# Patient Record
Sex: Female | Born: 1950
Health system: Southern US, Community
[De-identification: ages and names within clinical notes are randomized; demographics above are authoritative.]

## PROBLEM LIST (undated history)

## (undated) DIAGNOSIS — F329 Major depressive disorder, single episode, unspecified: Secondary | ICD-10-CM

## (undated) DIAGNOSIS — G2581 Restless legs syndrome: Secondary | ICD-10-CM

## (undated) DIAGNOSIS — I2510 Atherosclerotic heart disease of native coronary artery without angina pectoris: Secondary | ICD-10-CM

## (undated) DIAGNOSIS — G4730 Sleep apnea, unspecified: Secondary | ICD-10-CM

## (undated) DIAGNOSIS — I499 Cardiac arrhythmia, unspecified: Secondary | ICD-10-CM

## (undated) DIAGNOSIS — U071 COVID-19: Secondary | ICD-10-CM

## (undated) DIAGNOSIS — G8929 Other chronic pain: Secondary | ICD-10-CM

## (undated) DIAGNOSIS — K5792 Diverticulitis of intestine, part unspecified, without perforation or abscess without bleeding: Secondary | ICD-10-CM

## (undated) DIAGNOSIS — R600 Localized edema: Secondary | ICD-10-CM

## (undated) DIAGNOSIS — I700 Atherosclerosis of aorta: Secondary | ICD-10-CM

## (undated) DIAGNOSIS — R0600 Dyspnea, unspecified: Secondary | ICD-10-CM

## (undated) DIAGNOSIS — R001 Bradycardia, unspecified: Secondary | ICD-10-CM

## (undated) DIAGNOSIS — F419 Anxiety disorder, unspecified: Secondary | ICD-10-CM

## (undated) DIAGNOSIS — I10 Essential (primary) hypertension: Secondary | ICD-10-CM

## (undated) DIAGNOSIS — M1990 Unspecified osteoarthritis, unspecified site: Secondary | ICD-10-CM

## (undated) DIAGNOSIS — I480 Paroxysmal atrial fibrillation: Secondary | ICD-10-CM

## (undated) DIAGNOSIS — N189 Chronic kidney disease, unspecified: Secondary | ICD-10-CM

## (undated) DIAGNOSIS — R87619 Unspecified abnormal cytological findings in specimens from cervix uteri: Secondary | ICD-10-CM

## (undated) DIAGNOSIS — N950 Postmenopausal bleeding: Secondary | ICD-10-CM

## (undated) DIAGNOSIS — M545 Low back pain, unspecified: Secondary | ICD-10-CM

## (undated) DIAGNOSIS — R002 Palpitations: Secondary | ICD-10-CM

## (undated) DIAGNOSIS — F32A Depression, unspecified: Secondary | ICD-10-CM

## (undated) DIAGNOSIS — Z905 Acquired absence of kidney: Secondary | ICD-10-CM

## (undated) DIAGNOSIS — E559 Vitamin D deficiency, unspecified: Secondary | ICD-10-CM

## (undated) HISTORY — PX: CHOLECYSTECTOMY: SHX55

## (undated) HISTORY — PX: OTHER SURGICAL HISTORY: SHX169

## (undated) HISTORY — DX: Postmenopausal bleeding: N95.0

## (undated) HISTORY — PX: JOINT REPLACEMENT: SHX530

## (undated) HISTORY — PX: WRIST ARTHROSCOPY: SUR100

## (undated) HISTORY — PX: CARDIAC CATHETERIZATION: SHX172

## (undated) HISTORY — DX: Essential (primary) hypertension: I10

## (undated) HISTORY — PX: DILATION AND CURETTAGE OF UTERUS: SHX78

## (undated) HISTORY — DX: Acquired absence of kidney: Z90.5

## (undated) HISTORY — DX: Unspecified abnormal cytological findings in specimens from cervix uteri: R87.619

## (undated) HISTORY — PX: HERNIA REPAIR: SHX51

---
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
## 1977-08-04 DIAGNOSIS — Z905 Acquired absence of kidney: Secondary | ICD-10-CM

## 1977-08-04 HISTORY — PX: KIDNEY SURGERY: SHX687

## 1977-08-04 HISTORY — DX: Acquired absence of kidney: Z90.5

## 2016-04-16 ENCOUNTER — Other Ambulatory Visit: Payer: Self-pay | Admitting: Nurse Practitioner

## 2016-04-16 DIAGNOSIS — R262 Difficulty in walking, not elsewhere classified: Secondary | ICD-10-CM

## 2016-04-18 ENCOUNTER — Encounter: Payer: Self-pay | Admitting: Radiology

## 2016-04-18 ENCOUNTER — Ambulatory Visit
Admission: RE | Admit: 2016-04-18 | Discharge: 2016-04-18 | Disposition: A | Discharge status: Home / Self Care | Payer: Medicare Other | Source: Ambulatory Visit | Attending: Nurse Practitioner | Admitting: Nurse Practitioner

## 2016-04-18 DIAGNOSIS — M21372 Foot drop, left foot: Secondary | ICD-10-CM | POA: Insufficient documentation

## 2016-04-18 DIAGNOSIS — R9082 White matter disease, unspecified: Secondary | ICD-10-CM | POA: Diagnosis not present

## 2016-04-18 DIAGNOSIS — R262 Difficulty in walking, not elsewhere classified: Secondary | ICD-10-CM | POA: Diagnosis not present

## 2016-05-28 ENCOUNTER — Other Ambulatory Visit: Payer: Self-pay | Admitting: Neurology

## 2016-05-28 DIAGNOSIS — R29898 Other symptoms and signs involving the musculoskeletal system: Secondary | ICD-10-CM

## 2016-06-09 ENCOUNTER — Encounter: Payer: Self-pay | Admitting: Nurse Practitioner

## 2016-06-09 ENCOUNTER — Ambulatory Visit
Admission: RE | Admit: 2016-06-09 | Discharge: 2016-06-09 | Disposition: A | Discharge status: Home / Self Care | Payer: Medicare Other | Source: Ambulatory Visit | Attending: Neurology | Admitting: Neurology

## 2016-06-09 DIAGNOSIS — M48061 Spinal stenosis, lumbar region without neurogenic claudication: Secondary | ICD-10-CM | POA: Diagnosis not present

## 2016-06-09 DIAGNOSIS — M5126 Other intervertebral disc displacement, lumbar region: Secondary | ICD-10-CM | POA: Insufficient documentation

## 2016-06-09 DIAGNOSIS — R29898 Other symptoms and signs involving the musculoskeletal system: Secondary | ICD-10-CM | POA: Insufficient documentation

## 2016-06-23 ENCOUNTER — Ambulatory Visit: Payer: Medicare Other | Attending: Neurology | Admitting: Physical Therapy

## 2016-06-23 DIAGNOSIS — M6281 Muscle weakness (generalized): Secondary | ICD-10-CM | POA: Diagnosis not present

## 2016-06-23 DIAGNOSIS — M25551 Pain in right hip: Secondary | ICD-10-CM | POA: Diagnosis present

## 2016-06-23 DIAGNOSIS — M5441 Lumbago with sciatica, right side: Secondary | ICD-10-CM | POA: Diagnosis present

## 2016-06-23 DIAGNOSIS — R262 Difficulty in walking, not elsewhere classified: Secondary | ICD-10-CM | POA: Insufficient documentation

## 2016-06-23 DIAGNOSIS — G8929 Other chronic pain: Secondary | ICD-10-CM | POA: Insufficient documentation

## 2016-06-23 NOTE — Therapy (Signed)
[place] [facility] [address] [place], [address] Phone: [phone]   Fax:  [phone]  Physical Therapy Evaluation  Patient Details  Name: Whitney [name] MRN: [id number] Date of Birth: 1951-02-14 Referring Provider: Gurney Maxin  Encounter Date: 06/23/2016      PT End of Session - 06/23/16 1142    Visit Number 1   Number of Visits 17   Date for PT Re-Evaluation 08/18/16   Authorization Type g code   PT Start Time 0945   PT Stop Time 1035   PT Time Calculation (min) 50 min   Activity Tolerance Patient limited by pain   Behavior During Therapy [facility] for tasks assessed/performed      No past medical history on file.  No past surgical history on file.  There were no vitals filed for this visit.       Subjective Assessment - 06/23/16 0956    Subjective Pt reports she woke up in August with weakness in her L LE. She has had multiple years of pain in B LE and has required [facility] for gait for an extended period of time.  Denies back pain. Denies B/B. Does have N/T B, worse on R, appears to be in a stocking/glove distribution.    Pertinent History leg weakness   How long can you sit comfortably? forever   How long can you stand comfortably? 5 minutes   How long can you walk comfortably? "not at all"   Diagnostic tests imaging - see scanned docs   Patient Stated Goals "I want to be able to walk a mile"   Currently in Pain? No/denies            [facility] PT Assessment - 06/23/16 0001      Assessment   Medical Diagnosis B leg weakness   Referring Provider Gurney Maxin   Onset Date/Surgical Date 03/04/16   Next MD Visit 07/06/2016   Prior Therapy None for this     Precautions   Precautions Fall     Restrictions   Weight Bearing Restrictions No     Balance Screen   Has the patient fallen in the past 6 months Yes   How many times? 2   Has the patient had a decrease in activity level because of a  fear of falling?  Yes   Is the patient reluctant to leave their home because of a fear of falling?  Yes     [place] residence   Living Arrangements Other relatives     Prior Function   Level of Independence Independent with basic ADLs   Vocation Retired   Catering manager, "running around doing something"     Sensation   Additional Comments Pt reports impaired sensation on the R in a sciatic nerve distribution. Unable to test formally due to attire and time allowed.     Functional Tests   Functional tests Squat     Squat   Comments unable to perform due to pain     Posture/Postural Control   Posture Comments FHP, in standing pt stands with wt shifted onto L.      ROM / Strength   AROM / PROM / Strength PROM;Strength     PROM   Overall PROM Comments hip PROM is significantly limited B with ER, worse on R with some reproduction of pain with assessment. Knee flexion limited and painful B.      Strength  Overall Strength Deficits   Right/Left Hip Right;Left   Right Hip Flexion 3/5   Right Hip Extension 3/5   Right Hip External Rotation  3/5   Right Hip Internal Rotation 3/5   Right Hip ABduction 3/5   Right Hip ADduction 3/5   Left Hip Flexion 4/5   Left Hip Extension 4/5   Left Hip External Rotation 4/5   Left Hip Internal Rotation 4/5   Left Hip ABduction 4/5   Left Hip ADduction 4/5   Right/Left Knee Right;Left   Right Knee Flexion 4-/5   Right Knee Extension 4+/5   Left Knee Flexion 4/5   Left Knee Extension 4/5   Right/Left Ankle Right;Left   Right Ankle Dorsiflexion 4/5   Right Ankle Plantar Flexion 4/5   Right Ankle Inversion 4/5   Right Ankle Eversion 4/5   Left Ankle Dorsiflexion 4/5   Left Ankle Plantar Flexion 4/5   Left Ankle Inversion 4/5   Left Ankle Eversion 4/5     Palpation   Spinal mobility Spinal motion WNL except L rotation painful on R side.    Palpation comment pain with palpation over R glutes, R  paraspinals, noted hypertonicity in this region.     Ambulation/Gait   Assistive device Small based quad cane   Gait Pattern Step-to pattern;Trendelenburg;Lateral hip instability;Lateral trunk lean to left   Gait Comments 10 mws .2 m/s, inconsistent use of quad cane on R side.                           PT Education - 06/23/16 1141    Education provided Yes   Education Details course of PT, aquatic therapy   Person(s) Educated Patient   Methods Explanation   Comprehension Verbalized understanding             PT Long Term Goals - 06/23/16 1150      PT LONG TERM GOAL #1   Title Pt will improve 10 MWS to .8 M/S to improve functional activity tolerance/gait   Baseline .2 M/S   Time 8   Period Weeks   Status New     PT LONG TERM GOAL #2   Title Pt will be I with HEP to improve B hip strength to 5/5 to improve tolerance for gait   Baseline 3/5 R, 4/5 L   Time 8   Period Weeks   Status New     PT LONG TERM GOAL #3   Title pt will be able to sit<>stand with single UE support for decr. balance   Baseline definite use of UE for support for this   Time 8   Period Weeks   Status New               Plan - 06/23/16 1143    Clinical Impression Statement Pt is a pleasant 65 y/o female with c/o complex history of B LE pain, muscle weakness. Pain appears to be related to lumbar spine based on sciatic distribution of N/T. Muscle weakness appears to be related to pain in B knees. No foot drop, pt amb with quad cane with R trendelenberg. She has consistent pain with exercise. Will have pt attend aquatic PT for 2 wks for strengthening in non-painful environment, hope to progress to addressing LE pain and improving strength and functional tolerance.   Rehab Potential Good   Clinical Impairments Affecting Rehab Potential Pro: motivation. .Con: chronicity of pain   PT Frequency 2x /  week   PT Duration 8 weeks   PT Treatment/Interventions ADLs/Self Care Home  Management;Aquatic Therapy;Therapeutic activities;Therapeutic exercise;Manual techniques;Gait training;Neuromuscular re-education;Balance training   PT Next Visit Plan aquatic therapy for pain free strengthening   Consulted and Agree with Plan of Care Patient      Patient will benefit from skilled therapeutic intervention in order to improve the following deficits and impairments:  Pain, Improper body mechanics, Postural dysfunction, Difficulty walking, Decreased balance, Decreased strength, Decreased range of motion, Decreased endurance, Hypomobility  Visit Diagnosis: Muscle weakness (generalized) - Plan: PT plan of care cert/re-cert  Difficulty in walking, not elsewhere classified - Plan: PT plan of care cert/re-cert  Chronic right-sided low back pain with right-sided sciatica - Plan: PT plan of care cert/re-cert  Pain in right hip - Plan: PT plan of care cert/re-cert      G-Codes - [id number]    Functional Assessment Tool Used 10 MWS, MMT, clinical judgment   Functional Limitation Mobility: Walking and moving around   Mobility: Walking and Moving Around Current Status [id number]) At least 60 percent but less than 80 percent impaired, limited or restricted   Mobility: Walking and Moving Around Goal Status [phone]) At least 20 percent but less than 40 percent impaired, limited or restricted       Problem List There are no active problems to display for this patient.   Zayveon Raschke PT DPT 06/23/2016, 12:01 PM  [place] [facility] [address] [place], [address] Phone: [phone]   Fax:  [phone]  Name: Starlena Pata MRN: [id number] Date of Birth: [birth year]

## 2016-06-24 DIAGNOSIS — M79604 Pain in right leg: Secondary | ICD-10-CM | POA: Insufficient documentation

## 2016-06-24 DIAGNOSIS — M79605 Pain in left leg: Secondary | ICD-10-CM | POA: Insufficient documentation

## 2016-07-01 ENCOUNTER — Encounter: Payer: Medicare Other | Admitting: Physical Therapy

## 2016-07-01 ENCOUNTER — Other Ambulatory Visit: Payer: Self-pay | Admitting: Internal Medicine

## 2016-07-01 ENCOUNTER — Ambulatory Visit: Payer: Medicare Other | Admitting: Physical Therapy

## 2016-07-01 DIAGNOSIS — Z1231 Encounter for screening mammogram for malignant neoplasm of breast: Secondary | ICD-10-CM

## 2016-07-03 ENCOUNTER — Encounter: Payer: Medicare Other | Admitting: Physical Therapy

## 2016-07-04 DIAGNOSIS — M25569 Pain in unspecified knee: Secondary | ICD-10-CM | POA: Insufficient documentation

## 2016-07-07 ENCOUNTER — Encounter: Payer: Medicare Other | Admitting: Physical Therapy

## 2016-07-08 ENCOUNTER — Ambulatory Visit: Payer: Medicare Other | Admitting: Physical Therapy

## 2016-07-10 ENCOUNTER — Ambulatory Visit: Payer: Medicare Other | Admitting: Physical Therapy

## 2016-07-15 ENCOUNTER — Other Ambulatory Visit: Payer: Self-pay | Admitting: Obstetrics and Gynecology

## 2016-07-15 ENCOUNTER — Ambulatory Visit: Payer: Medicare Other | Admitting: Physical Therapy

## 2016-07-15 ENCOUNTER — Encounter: Payer: Self-pay | Admitting: Obstetrics and Gynecology

## 2016-07-15 ENCOUNTER — Ambulatory Visit (INDEPENDENT_AMBULATORY_CARE_PROVIDER_SITE_OTHER): Payer: Medicare Other | Admitting: Obstetrics and Gynecology

## 2016-07-15 VITALS — BP 177/69 | HR 73 | Ht 60.0 in | Wt 261.0 lb

## 2016-07-15 DIAGNOSIS — Z6841 Body Mass Index (BMI) 40.0 and over, adult: Secondary | ICD-10-CM | POA: Diagnosis not present

## 2016-07-15 DIAGNOSIS — Z803 Family history of malignant neoplasm of breast: Secondary | ICD-10-CM | POA: Diagnosis not present

## 2016-07-15 DIAGNOSIS — Z780 Asymptomatic menopausal state: Secondary | ICD-10-CM | POA: Insufficient documentation

## 2016-07-15 DIAGNOSIS — IMO0001 Reserved for inherently not codable concepts without codable children: Secondary | ICD-10-CM

## 2016-07-15 DIAGNOSIS — Z8041 Family history of malignant neoplasm of ovary: Secondary | ICD-10-CM | POA: Insufficient documentation

## 2016-07-15 DIAGNOSIS — E6609 Other obesity due to excess calories: Secondary | ICD-10-CM | POA: Diagnosis not present

## 2016-07-15 DIAGNOSIS — N840 Polyp of corpus uteri: Secondary | ICD-10-CM | POA: Insufficient documentation

## 2016-07-15 DIAGNOSIS — N950 Postmenopausal bleeding: Secondary | ICD-10-CM | POA: Diagnosis not present

## 2016-07-15 DIAGNOSIS — E669 Obesity, unspecified: Secondary | ICD-10-CM | POA: Insufficient documentation

## 2016-07-15 DIAGNOSIS — R938 Abnormal findings on diagnostic imaging of other specified body structures: Secondary | ICD-10-CM | POA: Diagnosis not present

## 2016-07-15 DIAGNOSIS — R9389 Abnormal findings on diagnostic imaging of other specified body structures: Secondary | ICD-10-CM | POA: Insufficient documentation

## 2016-07-15 NOTE — Patient Instructions (Signed)
1. Endometrial biopsy is done today 2. Endometrial polyp is removed today 3. Literature on genetic screening for breast cancer and ovarian cancer given 4. Genetic testing for breast and ovarian cancer is recommended 5. Return in 1 week for follow-up   Endometrial Biopsy, Care After Refer to this sheet in the next few weeks. These instructions provide you with information on caring for yourself after your procedure. Your health care provider may also give you more specific instructions. Your treatment has been planned according to current medical practices, but problems sometimes occur. Call your health care provider if you have any problems or questions after your procedure. WHAT TO EXPECT AFTER THE PROCEDURE After your procedure, it is typical to have the following:  You may have mild cramping and a small amount of vaginal bleeding for a few days after the procedure. This is normal. HOME CARE INSTRUCTIONS  Only take over-the-counter or prescription medicine as directed by your health care provider.  Do not douche, use tampons, or have sexual intercourse until your health care provider approves.  Follow your health care provider's instructions regarding any activity restrictions, such as strenuous exercise or heavy lifting. SEEK MEDICAL CARE IF:  You have heavy bleeding or bleeding longer than 2 days after the procedure.  You have bad smelling drainage from your vagina.  You have a fever and chills.  Youhave severe lower stomach (abdominal) pain. SEEK IMMEDIATE MEDICAL CARE IF:  You have severe cramps in your stomach or back.  You pass large blood clots.  Your bleeding increases.  You become weak or lightheaded, or you pass out. This information is not intended to replace advice given to you by your health care provider. Make sure you discuss any questions you have with your health care provider. Document Released: 05/11/2013 Document Reviewed: 05/11/2013 Elsevier  Interactive Patient Education  2017 [employer].

## 2016-07-15 NOTE — Progress Notes (Signed)
GYN ENCOUNTER NOTE  Subjective:       Whitney [name] is a 65 y.o. G33P1011 female is here for gynecologic evaluation of the following issues:  1. Postmenopausal bleeding  Patient reports history of postmenopausal bleeding which started approximately 3 years ago. It would wax and wane but has become more regular recently. She denies pelvic pain. She is not on hormone therapy.  Patient has significant past medical history of breast cancer in daughter and mother as well as ovarian cancer in mother. Bowel and bladder function are normal   Gynecologic History Patient's last menstrual period was 07/13/2016. Contraception: post menopausal status and tubal ligation Last Pap:? Last mammogram: ?  Obstetric History OB History  Gravida Para Term Preterm AB Living  2 1 1   1 1   SAB TAB Ectopic Multiple Live Births  1       1    # Outcome Date GA Lbr Len/2nd Weight Sex Delivery Anes PTL Lv  2 Term 85    F Vag-Spont   LIV  1 SAB               Past Medical History:  Diagnosis Date  . Abnormal Pap smear of cervix    Pt states she had colposcopy   . Hypertension   . PMB (postmenopausal bleeding)     Past Surgical History:  Procedure Laterality Date  . KIDNEY SURGERY  1979    Current Outpatient Prescriptions on File Prior to Visit  Medication Sig Dispense Refill  . amLODipine-atorvastatin (CADUET) 5-10 MG tablet Take 1 tablet by mouth daily.    . metoprolol-hydrochlorothiazide (LOPRESSOR HCT) 50-25 MG tablet Take 1 tablet by mouth daily.    Marland Kitchen rOPINIRole (REQUIP) 0.5 MG tablet Take 0.5 mg by mouth 3 (three) times daily.    . traMADol (ULTRAM) 50 MG tablet Take by mouth every 6 (six) hours as needed.     No current facility-administered medications on file prior to visit.     Allergies  Allergen Reactions  . Lisinopril Swelling    Social History   Social History  . Marital status: Divorced    Spouse name: N/A  . Number of children: N/A  . Years of education: N/A    Occupational History  . Not on file.   Social History Main Topics  . Smoking status: Never Smoker  . Smokeless tobacco: Never Used  . Alcohol use No  . Drug use:     Frequency: 3.0 times per week    Types: Marijuana     Comment: Pt states she uses for leg pain   . Sexual activity: Yes    Birth control/ protection: None   Other Topics Concern  . Not on file   Social History Narrative  . No narrative on file    Family History  Problem Relation Age of Onset  . Ovarian cancer Mother   . Hypertension Father   . Diabetes Father   . Breast cancer Sister     The following portions of the patient's history were reviewed and updated as appropriate: allergies, current medications, past family history, past medical history, past social history, past surgical history and problem list.  Review of Systems Review of Systems - Per history of present illness  Objective:   BP (!) 177/69 (BP Location: Right Arm, Patient Position: Sitting, Cuff Size: Large)   Pulse 73   Ht 5' (1.524 m)   Wt 261 lb (118.4 kg)   LMP 07/13/2016   BMI 50.97  kg/m  CONSTITUTIONAL: Well-developed, well-nourished female in no acute distress.  HENT:  Normocephalic, atraumatic.  NECK: Normal range of motion, supple, no masses.  Normal thyroid.  SKIN: Skin is warm and dry. No rash noted. Not diaphoretic. No erythema. No pallor. [place]: Alert and oriented to person, place, and time. PSYCHIATRIC: Normal mood and affect. Normal behavior. Normal judgment and thought content. CARDIOVASCULAR:Not Examined RESPIRATORY: Not Examined BREASTS: Not Examined ABDOMEN: Soft, non distended; Non tender.  No Organomegaly.Large pannus PELVIC:  External Genitalia: Normal  BUS: Normal  Vagina: Normal; mild atrophic changes  Cervix: 2 x 3 cm protruding polyp from the os, nonfriable; no cervical motion tenderness  Uterus: Normal size, shape,consistency, mobile, midplane  Adnexa: Normal; nonpalpable and nontender  RV:  Normal external exam  Bladder: Nontender MUSCULOSKELETAL: Normal range of motion. No tenderness.  No cyanosis, clubbing, or edema.   PROCEDURE #1: Endometrial biopsy Endometrial Biopsy Procedure Note  Pre-operative Diagnosis: Postmenopausal bleeding  Post-operative Diagnosis: Postmenopausal bleeding  Procedure Details   Urine pregnancy test was not done.  The risks (including infection, bleeding, pain, and uterine perforation) and benefits of the procedure were explained to the patient and Verbal informed consent was obtained.  Antibiotic prophylaxis against endocarditis was not indicated.   The patient was placed in the dorsal lithotomy position.  Bimanual exam showed the uterus to be in the neutral position.  A Graves' speculum inserted in the vagina. Endocervical curettage with a Kevorkian curette was not performed.   A sharp tenaculum was not applied to the anterior lip of the cervix for stabilization. The 3 mm Mylex pipette was used to sound the uterus to a depth of 8cm.  A Mylex 38m curette was used to sample the endometrium.  Sample was sent for pathologic examination.  Condition: Stable  Complications: None  Plan:  The patient was advised to call for any fever or for prolonged or severe pain or bleeding. She was advised to use OTC acetaminophen and OTC ibuprofen as needed for mild to moderate pain. She was advised to avoid vaginal intercourse for 48 hours or until the bleeding has completely stopped.  Attending Physician Documentation: MBrayton Mars MD   PROCEDURE #2: Endocervical polypectomy Verbal consent is obtained. The patient is in the dorsal lithotomy position and a Graves' speculum is inserted into the vagina to facilitate visualization of the cervix. The 2 x 4 cm mass protruding from the cervical os is grasped with a ring forcep. A twisting motion in a clockwise manner is performed to wind the pedicle and with gentle traction remove the polypoid mass. The  entire mass measured 7 x 2 cm in length. Specimen was sent to pathology. Minimal bleeding was encountered.    Assessment:   1. Postmenopausal bleeding  2. Endometrial polyp  3. Thickened endometrium  4. Obesity (BMI 50.0  without comorbidity)  5. Family history of breast cancer in first degree relative  6. Family history of ovarian cancer     Plan:   1. Endometrial biopsy is done 2. Endometrial polyp is removed 3. BRCA 1/BRCA2 testing is recommended 4. Literature on genetic testing for breast cancer and ovarian cancer given 5. Return in 1 week for follow-up   MBrayton Mars MD  Note: This dictation was prepared with Dragon dictation along with smaller phrase technology. Any transcriptional errors that result from this process are unintentional.

## 2016-07-17 ENCOUNTER — Ambulatory Visit: Payer: Medicare Other | Admitting: Physical Therapy

## 2016-07-18 LAB — PATHOLOGY

## 2016-07-21 ENCOUNTER — Encounter: Payer: Medicare Other | Admitting: Physical Therapy

## 2016-07-22 ENCOUNTER — Encounter: Payer: Self-pay | Admitting: Obstetrics and Gynecology

## 2016-07-22 ENCOUNTER — Ambulatory Visit (INDEPENDENT_AMBULATORY_CARE_PROVIDER_SITE_OTHER): Payer: Medicare Other | Admitting: Obstetrics and Gynecology

## 2016-07-22 VITALS — BP 153/80 | HR 65 | Ht 65.0 in | Wt 260.5 lb

## 2016-07-22 DIAGNOSIS — N8501 Benign endometrial hyperplasia: Secondary | ICD-10-CM | POA: Diagnosis not present

## 2016-07-22 DIAGNOSIS — N840 Polyp of corpus uteri: Secondary | ICD-10-CM | POA: Diagnosis not present

## 2016-07-22 DIAGNOSIS — Z803 Family history of malignant neoplasm of breast: Secondary | ICD-10-CM

## 2016-07-22 DIAGNOSIS — R938 Abnormal findings on diagnostic imaging of other specified body structures: Secondary | ICD-10-CM

## 2016-07-22 DIAGNOSIS — R9389 Abnormal findings on diagnostic imaging of other specified body structures: Secondary | ICD-10-CM

## 2016-07-22 DIAGNOSIS — Z8041 Family history of malignant neoplasm of ovary: Secondary | ICD-10-CM | POA: Diagnosis not present

## 2016-07-22 MED ORDER — MEGESTROL ACETATE 40 MG PO TABS: 40.0000 mg | ORAL_TABLET | Freq: Two times a day (BID) | ORAL | 5 refills | Status: DC

## 2016-07-22 NOTE — Patient Instructions (Signed)
1. Start taking Megace 40 mg a day 2. Monitor for abnormal uterine bleeding 3. Return in 3 months for follow-up 4. Consider genetic testing for breast and ovarian cancer

## 2016-07-22 NOTE — Progress Notes (Signed)
Chief complaint: 1. Postop possible bleeding 2. Endometrial polyp 3. Family history of breast cancer and ovarian cancer  Patient presents for follow-up.  Endocervical polyp was removed last visit PATHOLOGY: Diagnosis:  ENDOCERVIX, POLYP:  BENIGN ENDOMETRIAL POLYP.  SOL/07/18/2016   Endometrial biopsy was performed last visit. PATHOLOGY: Diagnosis:  ENDOMETRIUM, BIOPSY:  BENIGN ENDOMETRIAL HYPERPLASIA.  FRAGMENTED BENIGN ENDOMETRIAL POLYP.  SOL/07/18/2016   OBJECTIVE: BP (!) 153/80   Pulse 65   Ht 5\' 5"  (1.651 m)   Wt 260 lb 8 oz (118.2 kg)   LMP 07/13/2016   BMI 43.35 kg/m  Physical exam-deferred  ASSESSMENT: 1. Postmenopausal bleeding 2. Endometrial biopsy consistent with benign endometrial hyperplasia 3. Endometrial polyp, status post removal 4. Family history of ovarian cancer and breast cancer 5. Morbid obesity  PLAN: 1. Results were reviewed; questions answered 2. Management options for endometrial hyperplasia were discussed including  Medical-Megace or Mirena IUD  Surgical-robotic hysterectomy BSO 3. Patient is to start Megace 40 mg twice a day 4. Maintain menstrual calendar monitoring for any abnormal uterine bleeding 5. Return in 3 months for follow-up 6. Continue with weight loss program in order to obtain a lower body mass index; consider definitive surgery in the future  A total of 25 minutes were spent face-to-face with the patient during this encounter and over half of that time involved counseling and coordination of care.  Brayton Mars, MD Note: This dictation was prepared with Dragon dictation along with smaller phrase technology. Any transcriptional errors that result from this process are unintentional.

## 2016-07-24 ENCOUNTER — Encounter: Payer: Medicare Other | Admitting: Physical Therapy

## 2016-07-31 ENCOUNTER — Encounter: Payer: Medicare Other | Admitting: Physical Therapy

## 2016-08-05 ENCOUNTER — Encounter: Payer: Medicare Other | Admitting: Physical Therapy

## 2016-08-07 ENCOUNTER — Ambulatory Visit: Payer: Medicare Other | Admitting: Physical Therapy

## 2016-08-07 ENCOUNTER — Encounter: Payer: Medicare Other | Admitting: Physical Therapy

## 2016-08-11 ENCOUNTER — Encounter: Payer: Medicare Other | Admitting: Physical Therapy

## 2016-08-12 ENCOUNTER — Ambulatory Visit: Payer: Medicare Other | Attending: Neurology | Admitting: Physical Therapy

## 2016-08-12 DIAGNOSIS — G8929 Other chronic pain: Secondary | ICD-10-CM

## 2016-08-12 DIAGNOSIS — M6281 Muscle weakness (generalized): Secondary | ICD-10-CM | POA: Diagnosis not present

## 2016-08-12 DIAGNOSIS — R262 Difficulty in walking, not elsewhere classified: Secondary | ICD-10-CM | POA: Insufficient documentation

## 2016-08-12 DIAGNOSIS — M5441 Lumbago with sciatica, right side: Secondary | ICD-10-CM | POA: Insufficient documentation

## 2016-08-12 DIAGNOSIS — M25551 Pain in right hip: Secondary | ICD-10-CM | POA: Diagnosis present

## 2016-08-12 NOTE — Therapy (Signed)
[place] [facility] [address] [place], [place], [address] Phone: [phone]   Fax:  [phone]  Physical Therapy Treatment  Patient Details  Name: Whitney [name] MRN: [id number] Date of Birth: [birth year] Referring Provider: Gurney Maxin  Encounter Date: 08/12/2016      PT End of Session - 08/12/16 1115    Visit Number 2   Number of Visits 17   Date for PT Re-Evaluation 08/18/16   Authorization Type g code   PT Start Time [id number]   PT Stop Time 1110   PT Time Calculation (min) 55 min      Past Medical History:  Diagnosis Date  . Abnormal Pap smear of cervix    Pt states she had colposcopy   . Hypertension   . PMB (postmenopausal bleeding)     Past Surgical History:  Procedure Laterality Date  . KIDNEY SURGERY  1979    There were no vitals filed for this visit.      Subjective Assessment - 08/12/16 1114    Subjective Pt reports she would like to walk, learn to get of a chair with less pain. Pt would also like to have less fear when her L knee buckles.    Pertinent History leg weakness   How long can you sit comfortably? forever   How long can you stand comfortably? 5 minutes   How long can you walk comfortably? "not at all"   Diagnostic tests imaging - see scanned docs   Patient Stated Goals "I want to be able to walk a mile"                     Adult Aquatic Therapy - 08/12/16 1114      Aquatic Therapy Subjective   Subjective Pt expressed fear of water and during exercises. Pt provided SBA and noodles to build confidence. Pt was able to complete session         O: Pt entered the pool via ramp pushed in [facility] by PT, exited pool via walking with unilat UE on rail  50 ft =1 lap  Exercises performed in 3'6" depth  10 ft x 2 Sidestepping 2 laps Back ward with BUE on noodles  2 laps  Forward with BUE on noodles, cued for increased hip flexion 2 laps gait training with pole to simulate cane.  Cued for cane to be held on L hand, not R hand, and step to pattern with cane and R foot then Left foot (required SBA initially, cued for more anterior BOS fpr postural support)    ~20 ft on land gait training 4 reps with sit to stand with coordinated exhale and alignment                PT Long Term Goals - 06/23/16 1150      PT LONG TERM GOAL #1   Title Pt will improve 10 MWS to .8 M/S to improve functional activity tolerance/gait   Baseline .2 M/S   Time 8   Period Weeks   Status New     PT LONG TERM GOAL #2   Title Pt will be I with HEP to improve B hip strength to 5/5 to improve tolerance for gait   Baseline 3/5 R, 4/5 L   Time 8   Period Weeks   Status New     PT LONG TERM GOAL #3   Title pt will be able to sit<>stand with single UE support  for decr. balance   Baseline definite use of UE for support for this   Time 8   Period Weeks   Status New               Plan - 08/12/16 1115    Clinical Impression Statement Pt arrived to session with L foot limp, decreased stance on L leg while holding cane with R hand. PT adjusted cane height to fit pt's height, educated the mechanics for optimal stability with cane held on L hand, and trained pt step to pattern with less LLE drag/limp. Post Tx: pt demo'd improved gait and sit to stand t/f w/ report of less pain during these activities. Pt tolerated aquatic session and demo'd increased postural stability and feet propioception. Pt also demo'd increased confidence in the water despite her report of fear of not being able to swim. PT provided SBA initially but pt eventually demo'd IND with exercises.     Rehab Potential Good   Clinical Impairments Affecting Rehab Potential Pro: motivation. .Con: chronicity of pain   PT Frequency 2x / week   PT Duration 8 weeks   PT Treatment/Interventions ADLs/Self Care Home Management;Aquatic Therapy;Therapeutic activities;Therapeutic exercise;Manual techniques;Gait  training;Neuromuscular re-education;Balance training   PT Next Visit Plan aquatic therapy for pain free strengthening   Consulted and Agree with Plan of Care Patient      Patient will benefit from skilled therapeutic intervention in order to improve the following deficits and impairments:  Pain, Improper body mechanics, Postural dysfunction, Difficulty walking, Decreased balance, Decreased strength, Decreased range of motion, Decreased endurance, Hypomobility  Visit Diagnosis: Muscle weakness (generalized)  Difficulty in walking, not elsewhere classified  Chronic right-sided low back pain with right-sided sciatica  Pain in right hip     Problem List Patient Active Problem List   Diagnosis Date Noted  . Benign endometrial hyperplasia 07/22/2016  . Obesity (BMI 35.0-39.9 without comorbidity) 07/15/2016  . Thickened endometrium 07/15/2016  . Endometrial polyp 07/15/2016  . Postmenopausal bleeding 07/15/2016  . Class 3 obesity due to excess calories with body mass index (BMI) of 50.0 to 59.9 in adult ([place]) 07/15/2016  . Family history of breast cancer in first degree relative 07/15/2016  . Family history of ovarian cancer 07/15/2016    [name] ,PT, DPT, E-RYT   08/12/2016, 11:18 AM  [place] [facility] SERVICES [address] [place], [place], [address] Phone: [phone]   Fax:  [phone]  Name: Jerzy Spitzley MRN: [id number] Date of Birth: 20-Jan-1951

## 2016-08-13 ENCOUNTER — Ambulatory Visit
Admission: RE | Admit: 2016-08-13 | Discharge: 2016-08-13 | Disposition: A | Discharge status: Home / Self Care | Payer: Medicare Other | Source: Ambulatory Visit | Attending: Internal Medicine | Admitting: Internal Medicine

## 2016-08-13 DIAGNOSIS — Z1231 Encounter for screening mammogram for malignant neoplasm of breast: Secondary | ICD-10-CM | POA: Diagnosis not present

## 2016-08-14 ENCOUNTER — Ambulatory Visit: Payer: Medicare Other | Admitting: Physical Therapy

## 2016-08-14 ENCOUNTER — Encounter: Payer: Medicare Other | Admitting: Physical Therapy

## 2016-08-14 DIAGNOSIS — R262 Difficulty in walking, not elsewhere classified: Secondary | ICD-10-CM

## 2016-08-14 DIAGNOSIS — M5441 Lumbago with sciatica, right side: Secondary | ICD-10-CM

## 2016-08-14 DIAGNOSIS — M25551 Pain in right hip: Secondary | ICD-10-CM

## 2016-08-14 DIAGNOSIS — M6281 Muscle weakness (generalized): Secondary | ICD-10-CM

## 2016-08-14 DIAGNOSIS — G8929 Other chronic pain: Secondary | ICD-10-CM

## 2016-08-15 NOTE — Therapy (Signed)
[place] MAIN [facility] SERVICES [address] [place], [place], [address] Phone: [phone]   Fax:  [phone]  Physical Therapy Treatment  Patient Details  Name: Whitney [name] MRN: [id number] Date of Birth: 1951/01/30 Referring Provider: Gurney Maxin  Encounter Date: 08/14/2016      PT End of Session - 08/15/16 1730    Visit Number 3   Number of Visits 17   Date for PT Re-Evaluation 08/18/16   Authorization Type g code   PT Start Time 1050   PT Stop Time 1115   PT Time Calculation (min) 25 min   Activity Tolerance Patient tolerated treatment well;No increased pain   Behavior During Therapy WFL for tasks assessed/performed      Past Medical History:  Diagnosis Date  . Abnormal Pap smear of cervix    Pt states she had colposcopy   . Hypertension   . PMB (postmenopausal bleeding)     Past Surgical History:  Procedure Laterality Date  . KIDNEY SURGERY  1979    There were no vitals filed for this visit.      Subjective Assessment - 08/15/16 1216    Subjective Pt reported she felt tired after last session. Pt experienced medial L knee pain following session. Pt had not practiced the sidestep exercise at home but has been holding her cane in her L hand instead of her R.    Pertinent History leg weakness   How long can you sit comfortably? forever   How long can you stand comfortably? 5 minutes   How long can you walk comfortably? "not at all"   Diagnostic tests imaging - see scanned docs   Patient Stated Goals "I want to be able to walk a mile"                     Adult Aquatic Therapy - 08/15/16 1732      Aquatic Therapy Subjective   Subjective Pt reported less medial knee pain with cue for wider BOS. No additional complaints during session        O: Pt entered/exited the pool via ramp with UE support on rail.  50 ft =1 lap  Exercises performed in 3'6" depth    4 laps 20 ft sidestepping with BUE  support on rail, Left and Right, cued for wider BOS   4 laps forward walking with BUE on noodles with less CGA to decrease fear of water   Seated LAQ 3'  Relaxation seated on bench                    PT Long Term Goals - 08/15/16 1729      PT LONG TERM GOAL #1   Title Pt will improve 10 MWS to .8 M/S to improve functional activity tolerance/gait   Baseline .2 M/S   Time 8   Period Weeks   Status On-going     PT LONG TERM GOAL #2   Title Pt will be I with HEP to improve B hip strength to 5/5 to improve tolerance for gait   Baseline 3/5 R, 4/5 L   Time 8   Period Weeks   Status On-going     PT LONG TERM GOAL #3   Title pt will be able to sit<>stand with single UE support for decr. balance   Baseline definite use of UE for support for this   Time 8   Period Weeks   Status On-going  Plan - 08/15/16 1731    Clinical Impression Statement Pt showed good carry over with step to pattern using cane on L hand when walking on land. Minor cuing required for wider BOS. Noted less hip hike compared to last session. Pt showed increased endurance with pool exercises. Plan to increase duration of session next week with rest breaks due to obese composition.    Rehab Potential Good   Clinical Impairments Affecting Rehab Potential Pro: motivation. .Con: chronicity of pain   PT Frequency 2x / week   PT Duration 8 weeks   PT Treatment/Interventions ADLs/Self Care Home Management;Aquatic Therapy;Therapeutic activities;Therapeutic exercise;Manual techniques;Gait training;Neuromuscular re-education;Balance training   PT Next Visit Plan aquatic therapy for pain free strengthening   Consulted and Agree with Plan of Care Patient      Patient will benefit from skilled therapeutic intervention in order to improve the following deficits and impairments:  Pain, Improper body mechanics, Postural dysfunction, Difficulty walking, Decreased balance, Decreased strength,  Decreased range of motion, Decreased endurance, Hypomobility  Visit Diagnosis: Pain in right hip  Chronic right-sided low back pain with right-sided sciatica  Difficulty in walking, not elsewhere classified  Muscle weakness (generalized)     Problem List Patient Active Problem List   Diagnosis Date Noted  . Benign endometrial hyperplasia 07/22/2016  . Obesity (BMI 35.0-39.9 without comorbidity) 07/15/2016  . Thickened endometrium 07/15/2016  . Endometrial polyp 07/15/2016  . Postmenopausal bleeding 07/15/2016  . Class 3 obesity due to excess calories with body mass index (BMI) of 50.0 to 59.9 in adult ([place]) 07/15/2016  . Family history of breast cancer in first degree relative 07/15/2016  . Family history of ovarian cancer 07/15/2016    [name] ,PT, DPT, E-RYT  08/15/2016, 5:33 PM  [place] MAIN [facility] SERVICES [address] [place], [place], [address] Phone: [phone]   Fax:  [phone]  Name: Whitney [name] MRN: [id number] Date of Birth: 08/18/1950

## 2016-08-18 ENCOUNTER — Encounter: Payer: Medicare Other | Admitting: Physical Therapy

## 2016-08-19 ENCOUNTER — Ambulatory Visit: Payer: Medicare Other | Admitting: Physical Therapy

## 2016-08-19 DIAGNOSIS — M6281 Muscle weakness (generalized): Secondary | ICD-10-CM

## 2016-08-19 DIAGNOSIS — G8929 Other chronic pain: Secondary | ICD-10-CM

## 2016-08-19 DIAGNOSIS — M25551 Pain in right hip: Secondary | ICD-10-CM

## 2016-08-19 DIAGNOSIS — R262 Difficulty in walking, not elsewhere classified: Secondary | ICD-10-CM

## 2016-08-19 DIAGNOSIS — M5441 Lumbago with sciatica, right side: Secondary | ICD-10-CM

## 2016-08-20 NOTE — Addendum Note (Signed)
Addended by: [name] on: 08/20/2016 03:05 PM   Modules accepted: Orders

## 2016-08-20 NOTE — Therapy (Addendum)
Whitney [name] [facility] SERVICES [address] [place], [place], [address] Phone: [phone]   Fax:  [phone]  Physical Therapy Treatment  Patient Details  Name: Whitney [name] MRN: [id number] Date of Birth: February 26, 1951 Referring Provider: Gurney Maxin  Encounter Date: 08/19/2016      PT End of Session - 08/20/16 [id number]    Visit Number 4   Number of Visits 17   Date for PT Re-Evaluation 11/11/16   Authorization Type g code 4   PT Start Time 1030   PT Stop Time 1115   PT Time Calculation (min) 45 min   Activity Tolerance Patient tolerated treatment well;No increased pain   Behavior During Therapy WFL for tasks assessed/performed      Past Medical History:  Diagnosis Date  . Abnormal Pap smear of cervix    Pt states she had colposcopy   . Hypertension   . PMB (postmenopausal bleeding)     Past Surgical History:  Procedure Laterality Date  . KIDNEY SURGERY  1979    There were no vitals filed for this visit.      Subjective Assessment - 08/20/16 1434    Subjective Pt reported no complaints after last session   Pertinent History leg weakness   How long can you sit comfortably? forever   How long can you stand comfortably? 5 minutes   How long can you walk comfortably? "not at all"   Diagnostic tests imaging - see scanned docs   Patient Stated Goals "I want to be able to walk a mile"      O: Pt entered/exited the pool via ramp with B UE support on rail: sidestepping.  50 ft =1 lap  Exercises performed in 3'0" -4'6" depth   Sidestepping 3" to 4'6" depth 2 laps L/R without BUE support  2 laps backward walking with BUE on noodles 2 laps forward walking with BUE on noodles  Stretches:   calf stretch (plank position on bench, feet on pool floor)   sidesitting on bench for quad stretch , reaching for ankle, R ankle required noodle  hip flexor stretching   balance training 10 reps stepping backward without BUE  support  Seated  Bicycling  Relaxation     Required safe t/f cues for stand to sit                              PT Long Term Goals - 08/20/16 1438      PT LONG TERM GOAL #1   Title Pt will improve 10 MWS to .8 M/S to improve functional activity tolerance/gait   Baseline .2 M/S   Time 8   Period Weeks   Status On-going     PT LONG TERM GOAL #2   Title Pt will be I with HEP to improve B hip strength to 5/5 to improve tolerance for gait   Baseline 3/5 R, 4/5 L   Time 8   Period Weeks   Status On-going     PT LONG TERM GOAL #3   Title pt will be able to sit<>stand with single UE support for decr. balance   Baseline definite use of UE for support for this   Time 8   Period Weeks   Status On-going               Plan - 08/20/16 1438    Clinical Impression Statement  Pt has completed 2 aquatic  sessions following her evaluation. Pt is tolerating aquatic therapy exceptionally well. Pt showed less fear in the water today as she demo'd IND in the water with less CGA/SBA assist. Pt's endurance increased with walking today. Initiated balance training. Pt required proper stand<> sit t/f technique to decrease risk of falls. Pt continues to benefit from skilled PT.      Rehab Potential Good   Clinical Impairments Affecting Rehab Potential Pro: motivation. .Con: chronicity of pain   PT Frequency 2x / week   PT Duration 8 weeks   PT Treatment/Interventions ADLs/Self Care Home Management;Aquatic Therapy;Therapeutic activities;Therapeutic exercise;Manual techniques;Gait training;Neuromuscular re-education;Balance training   PT Next Visit Plan aquatic therapy for pain free strengthening   Consulted and Agree with Plan of Care Patient      Patient will benefit from skilled therapeutic intervention in order to improve the following deficits and impairments:  Pain, Improper body mechanics, Postural dysfunction, Difficulty walking, Decreased balance, Decreased  strength, Decreased range of motion, Decreased endurance, Hypomobility  Visit Diagnosis: Pain in right hip  Chronic right-sided low back pain with right-sided sciatica  Difficulty in walking, not elsewhere classified  Muscle weakness (generalized)     Problem List Patient Active Problem List   Diagnosis Date Noted  . Benign endometrial hyperplasia 07/22/2016  . Obesity (BMI 35.0-39.9 without comorbidity) 07/15/2016  . Thickened endometrium 07/15/2016  . Endometrial polyp 07/15/2016  . Postmenopausal bleeding 07/15/2016  . Class 3 obesity due to excess calories with body mass index (BMI) of 50.0 to 59.9 in adult ([place]) 07/15/2016  . Family history of breast cancer in first degree relative 07/15/2016  . Family history of ovarian cancer 07/15/2016    [name] ,PT, DPT, E-RYT  08/20/2016, 2:44 PM  [place] [name] SERVICES [address] [place], [place], [address] Phone: [phone]   Fax:  [phone]  Name: Whitney [name] MRN: [id number] Date of Birth: [birth year]

## 2016-08-21 ENCOUNTER — Encounter: Payer: Medicare Other | Admitting: Physical Therapy

## 2016-08-21 ENCOUNTER — Ambulatory Visit: Payer: Medicare Other | Admitting: Physical Therapy

## 2016-08-25 ENCOUNTER — Other Ambulatory Visit: Payer: Self-pay | Admitting: Internal Medicine

## 2016-08-25 DIAGNOSIS — R928 Other abnormal and inconclusive findings on diagnostic imaging of breast: Secondary | ICD-10-CM

## 2016-08-26 ENCOUNTER — Encounter: Payer: Self-pay | Admitting: Physical Therapy

## 2016-08-26 ENCOUNTER — Ambulatory Visit: Payer: Medicare Other | Admitting: Physical Therapy

## 2016-08-26 DIAGNOSIS — G8929 Other chronic pain: Secondary | ICD-10-CM

## 2016-08-26 DIAGNOSIS — M6281 Muscle weakness (generalized): Secondary | ICD-10-CM | POA: Diagnosis not present

## 2016-08-26 DIAGNOSIS — M25551 Pain in right hip: Secondary | ICD-10-CM

## 2016-08-26 DIAGNOSIS — M5441 Lumbago with sciatica, right side: Secondary | ICD-10-CM

## 2016-08-26 DIAGNOSIS — R262 Difficulty in walking, not elsewhere classified: Secondary | ICD-10-CM

## 2016-08-27 NOTE — Therapy (Signed)
[place] [facility] [address] [place], [address] Phone: [phone]   Fax:  [phone]  Physical Therapy Treatment  Patient Details  Name: Whitney [name] MRN: [id number] Date of Birth: 1950-12-05 Referring Provider: Gurney Maxin  Encounter Date: 08/26/2016      PT End of Session - 08/26/16 1213    Visit Number 5   Number of Visits 17   Date for PT Re-Evaluation 11/11/16   Authorization Type g code   Authorization - Visit Number 5   PT Start Time [id number]   PT Stop Time 1105   PT Time Calculation (min) 34 min   Equipment Utilized During Treatment Gait belt   Activity Tolerance Patient tolerated treatment well;No increased pain   Behavior During Therapy WFL for tasks assessed/performed      Past Medical History:  Diagnosis Date  . Abnormal Pap smear of cervix    Pt states she had colposcopy   . Hypertension   . PMB (postmenopausal bleeding)     Past Surgical History:  Procedure Laterality Date  . KIDNEY SURGERY  1979    There were no vitals filed for this visit.      Subjective Assessment - 08/26/16 1039    Subjective Pt presents to clinic stating that she had a recent appointment to the orthopedist (Dr. Carlynn Spry) who stated that she needed a knee replacement but told her she needed to lose 20# before surgery. She recieved a cortisone injection in knee and stated that she believes that a TKA is the best course of therapy.   Pertinent History leg weakness   How long can you sit comfortably? forever   How long can you stand comfortably? 5 minutes   How long can you walk comfortably? "not at all"   Diagnostic tests imaging - see scanned docs   Patient Stated Goals "I want to be able to walk a mile"                Objective: Extensive discussion between PT and pt about current course of treatment, expectations of coming appointments and expectations of possible TKA  surgery.  Reassessment of 10 meter walk test and LE strength screening as well as LEFS. Pt performed 10 meter walk test with quad cane in 12.2 seconds. Knee and ankle strength was WNL bilaterally except R knee flexion which was a 4/5 and R knee extension which was a 5/5 but painful. Reassessment of LEFS was assessed and performance increased to a 32/80 from 28/80.  Based on assessment and per pt report the pool has been signifiantly important for her improved functional status and improved ability to walk. Pt continues to have significant knee pain. Pt would benefit from additional pool sessions prior to possible knee replacement.                 PT Education - 08/26/16 1046    Education provided Yes   Education Details appropritate course of treatment between now and return doctors appointment 09/04/16   Person(s) Educated Patient   Methods Explanation   Comprehension Verbalized understanding             PT Long Term Goals - 08/20/16 1438      PT LONG TERM GOAL #1   Title Pt will improve 10 MWS to .8 M/S to improve functional activity tolerance/gait   Baseline .2 M/S   Time 8   Period Weeks   Status On-going  PT LONG TERM GOAL #2   Title Pt will be I with HEP to improve B hip strength to 5/5 to improve tolerance for gait   Baseline 3/5 R, 4/5 L   Time 8   Period Weeks   Status On-going     PT LONG TERM GOAL #3   Title pt will be able to sit<>stand with single UE support for decr. balance   Baseline definite use of UE for support for this   Time 8   Period Weeks   Status On-going               Plan - 08/26/16 1214    Clinical Impression Statement Pt continues to use 4 point cane for excessive amounts of UE support during gait.  Pts strength and ROM continues to improve with aquatic therapy. Continue aquatic therapy until follow up after MD appointment 09/04/16. Pt is encouraged about prospects of TKA surgery and states thinks it will be the best course  of treatment for her.   Rehab Potential Good   Clinical Impairments Affecting Rehab Potential Pro: motivation. .Con: chronicity of pain   PT Frequency 2x / week   PT Duration 8 weeks   PT Treatment/Interventions ADLs/Self Care Home Management;Aquatic Therapy;Therapeutic activities;Therapeutic exercise;Manual techniques;Gait training;Neuromuscular re-education;Balance training   PT Next Visit Plan aquatic therapy for pain free strengthening   Consulted and Agree with Plan of Care Patient      Patient will benefit from skilled therapeutic intervention in order to improve the following deficits and impairments:  Pain, Improper body mechanics, Postural dysfunction, Difficulty walking, Decreased balance, Decreased strength, Decreased range of motion, Decreased endurance, Hypomobility  Visit Diagnosis: Difficulty in walking, not elsewhere classified  Muscle weakness (generalized)  Chronic right-sided low back pain with right-sided sciatica  Pain in right hip     Problem List Patient Active Problem List   Diagnosis Date Noted  . Benign endometrial hyperplasia 07/22/2016  . Obesity (BMI 35.0-39.9 without comorbidity) 07/15/2016  . Thickened endometrium 07/15/2016  . Endometrial polyp 07/15/2016  . Postmenopausal bleeding 07/15/2016  . Class 3 obesity due to excess calories with body mass index (BMI) of 50.0 to 59.9 in adult ([place]) 07/15/2016  . Family history of breast cancer in first degree relative 07/15/2016  . Family history of ovarian cancer 07/15/2016    Fisher,Benjamin PT DPT 08/27/2016, 7:45 AM  [place] PH[facility] MEDIC[facility] [address] [place], [address] Phone: [phone]   Fax:  [phone]  Name: Rosali Sobotka MRN: [id number] Date of Birth: 03-10-1951

## 2016-08-28 ENCOUNTER — Ambulatory Visit: Payer: Medicare Other | Admitting: Physical Therapy

## 2016-08-28 DIAGNOSIS — M6281 Muscle weakness (generalized): Secondary | ICD-10-CM

## 2016-08-28 DIAGNOSIS — M25551 Pain in right hip: Secondary | ICD-10-CM

## 2016-08-28 DIAGNOSIS — G8929 Other chronic pain: Secondary | ICD-10-CM

## 2016-08-28 DIAGNOSIS — R262 Difficulty in walking, not elsewhere classified: Secondary | ICD-10-CM

## 2016-08-28 DIAGNOSIS — M5441 Lumbago with sciatica, right side: Secondary | ICD-10-CM

## 2016-08-28 NOTE — Therapy (Signed)
[place] MAIN [facility] SERVICES [address] [place], [place], [address] Phone: [phone]   Fax:  [phone]  Physical Therapy Treatment  Patient Details  Name: Whitney [name] MRN: [id number] Date of Birth: [birth year] Referring Provider: Gurney Maxin  Encounter Date: 08/28/2016      PT End of Session - 08/28/16 1129    Visit Number 6   Number of Visits 17   Date for PT Re-Evaluation 11/11/16   Authorization Type g code   PT Start Time [id number]   PT Stop Time 1015   PT Time Calculation (min) 40 min      Past Medical History:  Diagnosis Date  . Abnormal Pap smear of cervix    Pt states she had colposcopy   . Hypertension   . PMB (postmenopausal bleeding)     Past Surgical History:  Procedure Laterality Date  . KIDNEY SURGERY  1979    There were no vitals filed for this visit.      Subjective Assessment - 08/28/16 1126    Subjective Pt reported at the beginning of this week, she has had bilateral radiating pain from back down B lateral foot. Pt states she has been walking more and maybe she is over doing it. Pt stated she also did not feel good today because she found out her uncle passed away.    Pertinent History leg weakness   How long can you sit comfortably? forever   How long can you stand comfortably? 5 minutes   How long can you walk comfortably? "not at all"   Diagnostic tests imaging - see scanned docs   Patient Stated Goals "I want to be able to walk a mile"                     Adult Aquatic Therapy - 08/28/16 1129      Aquatic Therapy Subjective   Subjective Pt had no pain during session nor afterwards      O: Pt entered/exited the pool via ramp with single UE support on rail.  50 ft =1 lap  Exercises performed in 3'6" depth   Stretches: QL stretch in mini squat, trunk rotation R, pt reported her L low back pain was relieved.  2 laps walking with noodles , cued for mid/forefoot striking    Floating on noodles for relaxation 2 laps  30 reps of deep core level 2 on noodles  Guided body scan  With  Relaxation on noodles under head, armpits, midback, posterior thighs pulled by PT across 2 laps.   Guided pt on transitioning to stand with minimal strain on neck and back.                        PT Long Term Goals - 08/20/16 1438      PT LONG TERM GOAL #1   Title Pt will improve 10 MWS to .8 M/S to improve functional activity tolerance/gait   Baseline .2 M/S   Time 8   Period Weeks   Status On-going     PT LONG TERM GOAL #2   Title Pt will be I with HEP to improve B hip strength to 5/5 to improve tolerance for gait   Baseline 3/5 R, 4/5 L   Time 8   Period Weeks   Status On-going     PT LONG TERM GOAL #3   Title pt will be able to sit<>stand with single UE support  for decr. balance   Baseline definite use of UE for support for this   Time 8   Period Weeks   Status On-going               Plan - 08/28/16 1129    Clinical Impression Statement Pt is progressing well with aquatic Tx w/ good carry over with gait. Pt reports no pain during and after the session. Pt expressed feeling relaxed after mindfulness training while floating on noodles, guided by PT. SUspect pt's radiating pain from low back  may be due to pt's placement of SPC too far ahead and taking long strides. Provided cues for eccentric control of foot with heelstrike stance phase which elicited less pain in the knees and no radiating pain.   Pt demo'd ability to walk with single UE on rail up.down ramp which is an improvement. Initiated deep core strengthening and low back stretches in HEP. Pt demo'd correctly. Pt will continue to benefit from skilled PT.    Rehab Potential Good   Clinical Impairments Affecting Rehab Potential Pro: motivation. .Con: chronicity of pain   PT Frequency 2x / week   PT Duration 8 weeks   PT Treatment/Interventions ADLs/Self Care Home Management;Aquatic  Therapy;Therapeutic activities;Therapeutic exercise;Manual techniques;Gait training;Neuromuscular re-education;Balance training   PT Next Visit Plan aquatic therapy for pain free strengthening   Consulted and Agree with Plan of Care Patient      Patient will benefit from skilled therapeutic intervention in order to improve the following deficits and impairments:  Pain, Improper body mechanics, Postural dysfunction, Difficulty walking, Decreased balance, Decreased strength, Decreased range of motion, Decreased endurance, Hypomobility  Visit Diagnosis: Difficulty in walking, not elsewhere classified  Muscle weakness (generalized)  Chronic right-sided low back pain with right-sided sciatica  Pain in right hip     Problem List Patient Active Problem List   Diagnosis Date Noted  . Benign endometrial hyperplasia 07/22/2016  . Obesity (BMI 35.0-39.9 without comorbidity) 07/15/2016  . Thickened endometrium 07/15/2016  . Endometrial polyp 07/15/2016  . Postmenopausal bleeding 07/15/2016  . Class 3 obesity due to excess calories with body mass index (BMI) of 50.0 to 59.9 in adult ([place]) 07/15/2016  . Family history of breast cancer in first degree relative 07/15/2016  . Family history of ovarian cancer 07/15/2016    [name] ,PT, DPT, E-RYT  08/28/2016, 11:44 AM  [place] MAIN [facility] SERVICES [address] [place], [place], [address] Phone: [phone]   Fax:  [phone]  Name: Whitney [name] MRN: [id number] Date of Birth: Dec 26, 1950

## 2016-09-04 ENCOUNTER — Ambulatory Visit: Payer: Medicare Other | Admitting: Physical Therapy

## 2016-09-04 ENCOUNTER — Ambulatory Visit: Payer: Medicare Other | Attending: Neurology | Admitting: Physical Therapy

## 2016-09-04 DIAGNOSIS — M25551 Pain in right hip: Secondary | ICD-10-CM | POA: Insufficient documentation

## 2016-09-04 DIAGNOSIS — R262 Difficulty in walking, not elsewhere classified: Secondary | ICD-10-CM | POA: Insufficient documentation

## 2016-09-04 DIAGNOSIS — M5441 Lumbago with sciatica, right side: Secondary | ICD-10-CM | POA: Insufficient documentation

## 2016-09-04 DIAGNOSIS — M6281 Muscle weakness (generalized): Secondary | ICD-10-CM | POA: Insufficient documentation

## 2016-09-04 DIAGNOSIS — G8929 Other chronic pain: Secondary | ICD-10-CM | POA: Insufficient documentation

## 2016-09-04 NOTE — Therapy (Signed)
[place] MAIN [facility] SERVICES [address] [place], [place], [address] Phone: [phone]   Fax:  [phone]  Patient Details  Name: Whitney [name] MRN: [id number] Date of Birth: 1950-10-05 Referring Provider:  Anabel Bene, MD  Encounter Date: 09/04/2016   Pt arrived early to her session, expressing she was not going to come because she was with a cold/cough and her family member had the flu. Pt stated her L knee has been swollen and her back has been hurting. Pt had not been performing the exercises she learned at last session. Her MD recommended her to get a Prednisone shot to relieve her pain while she continues with aquatic Tx.   PT deferred session today due to pt's report of sickness. PT reviewed deep core level 1-2 exercises and pt voiced understanding to perform them to strengthen her back and decrease LBP. PT educated pt to elevate her L knee and to apply ice for 55min max per hour to decrease L knee swelling.  Pt will be mailed a copy of the deep core exercises to increase compliance to HEP.   Visit was not charged.   [name] ,PT, DPT, E-RYT  09/04/2016, 10:49 AM   [facility] MAIN [facility] SERVICES [address] [place], [place], [address] Phone: [phone]   Fax:  [phone]

## 2016-09-08 ENCOUNTER — Encounter: Payer: Self-pay | Admitting: Physical Therapy

## 2016-09-08 ENCOUNTER — Ambulatory Visit: Payer: Medicare Other | Admitting: Physical Therapy

## 2016-09-08 DIAGNOSIS — R262 Difficulty in walking, not elsewhere classified: Secondary | ICD-10-CM | POA: Diagnosis present

## 2016-09-08 DIAGNOSIS — M25551 Pain in right hip: Secondary | ICD-10-CM | POA: Diagnosis present

## 2016-09-08 DIAGNOSIS — G8929 Other chronic pain: Secondary | ICD-10-CM | POA: Diagnosis present

## 2016-09-08 DIAGNOSIS — M6281 Muscle weakness (generalized): Secondary | ICD-10-CM | POA: Diagnosis present

## 2016-09-08 DIAGNOSIS — M5441 Lumbago with sciatica, right side: Secondary | ICD-10-CM

## 2016-09-08 NOTE — Therapy (Signed)
[facility] [address] [place], [address] Phone: [phone]   Fax:  [phone]  Physical Therapy Treatment  Patient Details  Name: Whitney [name] MRN: [id number] Date of Birth: 1951-05-30 Referring Provider: Gurney Maxin  Encounter Date: 09/08/2016      PT End of Session - 09/08/16 1215    Visit Number 7   Number of Visits 17   Date for PT Re-Evaluation 11/11/16   Authorization Type g code   PT Start Time [id number]   PT Stop Time 1205   PT Time Calculation (min) 30 min   Equipment Utilized During Treatment Gait belt   Activity Tolerance Patient tolerated treatment well;No increased pain      Past Medical History:  Diagnosis Date  . Abnormal Pap smear of cervix    Pt states she had colposcopy   . Hypertension   . PMB (postmenopausal bleeding)     Past Surgical History:  Procedure Laterality Date  . KIDNEY SURGERY  1979    There were no vitals filed for this visit.      Subjective Assessment - 09/08/16 1209    Subjective Pt states that she visited her orthopaedic last week and decided that she was going to have a TKA as soon as she loses the last 8 pounds required by surgeon. She states that her back and radicular symptoms are minimal at this time and that she is able to control pain with exercise and medication. She is eager to have knee replacement surgery and states that she wants to have it as soon as possible. Pt states that her aquatic therapy is still going well and that she has been compliant with HEP.   Pertinent History leg weakness   How long can you sit comfortably? forever   How long can you stand comfortably? 5 minutes   How long can you walk comfortably? "not at all"   Diagnostic tests imaging - see scanned docs   Patient Stated Goals "I want to be able to walk a mile"     Objective: Extensive self care education about appropriate next steps with therapy as well as what to  expect leading up to TKA and post surgery. Discussion about goals and appropriate time tables to achieve them. Pt stated that she felt that she got the biggest use out of aquatic therapy and that when taking into account the amount of therapy she can expect after TKA she would like to continue aquatic therapy but pause land therapy after this visit. PT verbalized understanding of self care management.  Gait assessment performed as well as palpation of R knee. Pt stated that she would like to begin using the treadmill as a form of exercise outside of aquatic therapy.  Pt ambulated on treadmill for 5 minutes at fluctuating speeds between .5 and 1. Pt required min assist to prevent becoming over extended and legs sliding out from under her. Excessive UE use on rails. PT instructed pt to not use treadmill at this time to increased fall risk however encouraged walking on flat ground with SPC if pt felt safe doing so.                                  PT Long Term Goals - 08/20/16 1438      PT LONG TERM GOAL #1   Title Pt will improve 10 MWS to .8  M/S to improve functional activity tolerance/gait   Baseline .2 M/S   Time 8   Period Weeks   Status On-going     PT LONG TERM GOAL #2   Title Pt will be I with HEP to improve B hip strength to 5/5 to improve tolerance for gait   Baseline 3/5 R, 4/5 L   Time 8   Period Weeks   Status On-going     PT LONG TERM GOAL #3   Title pt will be able to sit<>stand with single UE support for decr. balance   Baseline definite use of UE for support for this   Time 8   Period Weeks   Status On-going               Plan - 09/08/16 1347    Clinical Impression Statement Pt gross LE strength continues to be decreased and gait continues to be extremely unstable. Pt requires a large amount of UE support on 4 point cane to ambulate. Back and associated radicular symptoms are absent at this time and pt states that she only experiences  them ~once a week. Pt extremely point tender over medial and lateral joint line of L knee. Continue aquatic therapy and HEP for LBP and pain modulation of L knee. Return to land therapy after completion of aquatic visits to reassess prior TKA.   Rehab Potential Good   Clinical Impairments Affecting Rehab Potential Pro: motivation. .Con: chronicity of pain   PT Frequency 2x / week   PT Duration 8 weeks   PT Treatment/Interventions ADLs/Self Care Home Management;Aquatic Therapy;Therapeutic activities;Therapeutic exercise;Manual techniques;Gait training;Neuromuscular re-education;Balance training   PT Next Visit Plan aquatic therapy for pain free strengthening   Consulted and Agree with Plan of Care Patient      Patient will benefit from skilled therapeutic intervention in order to improve the following deficits and impairments:  Pain, Improper body mechanics, Postural dysfunction, Difficulty walking, Decreased balance, Decreased strength, Decreased range of motion, Decreased endurance, Hypomobility, Prosthetic Dependency  Visit Diagnosis: Muscle weakness (generalized)  Difficulty in walking, not elsewhere classified  Chronic right-sided low back pain with right-sided sciatica     Problem List Patient Active Problem List   Diagnosis Date Noted  . Benign endometrial hyperplasia 07/22/2016  . Obesity (BMI 35.0-39.9 without comorbidity) 07/15/2016  . Thickened endometrium 07/15/2016  . Endometrial polyp 07/15/2016  . Postmenopausal bleeding 07/15/2016  . Class 3 obesity due to excess calories with body mass index (BMI) of 50.0 to 59.9 in adult ([place]) 07/15/2016  . Family history of breast cancer in first degree relative 07/15/2016  . Family history of ovarian cancer 07/15/2016    Yolonda Kida SPT Uziel Covault PT DPT 09/08/2016, 3:42 PM  [facility] [address], [place], [address] Phone: [phone]   Fax:   [phone]  Name: Tajah Lala MRN: [id number] Date of Birth: 29-Apr-1951

## 2016-09-11 ENCOUNTER — Ambulatory Visit: Payer: Medicare Other | Admitting: Physical Therapy

## 2016-09-11 DIAGNOSIS — M5441 Lumbago with sciatica, right side: Secondary | ICD-10-CM

## 2016-09-11 DIAGNOSIS — R262 Difficulty in walking, not elsewhere classified: Secondary | ICD-10-CM | POA: Diagnosis not present

## 2016-09-11 DIAGNOSIS — G8929 Other chronic pain: Secondary | ICD-10-CM

## 2016-09-11 DIAGNOSIS — M25551 Pain in right hip: Secondary | ICD-10-CM

## 2016-09-11 DIAGNOSIS — M6281 Muscle weakness (generalized): Secondary | ICD-10-CM

## 2016-09-12 NOTE — Therapy (Signed)
[place] [facility]IN [facility] SERVICES [address] [place], [place], [address] Phone: [phone]   Fax:  [phone]  Physical Therapy Treatment  Patient Details  Name: Whitney [name] MRN: [id number] Date of Birth: 1951/02/22 Referring Provider: Gurney Maxin  Encounter Date: 09/11/2016      PT End of Session - 09/12/16 0954    Visit Number 8   Number of Visits 17   Date for PT Re-Evaluation 11/11/16   Authorization Type g code   Authorization - Visit Number 6   PT Start Time 1100   PT Stop Time [id number]   PT Time Calculation (min) 45 min   Activity Tolerance Patient tolerated treatment well;No increased pain;Patient limited by fatigue   Behavior During Therapy [facility] for tasks assessed/performed      Past Medical History:  Diagnosis Date  . Abnormal Pap smear of cervix    Pt states she had colposcopy   . Hypertension   . PMB (postmenopausal bleeding)     Past Surgical History:  Procedure Laterality Date  . KIDNEY SURGERY  1979    There were no vitals filed for this visit.      Subjective Assessment - 09/12/16 0951    Subjective Pt reported she is feeling better from her cold. Pt continues to perform her deep core mm and enjoys it alot.    Pertinent History leg weakness   How long can you sit comfortably? forever   How long can you stand comfortably? 5 minutes   How long can you walk comfortably? "not at all"   Diagnostic tests imaging - see scanned docs   Patient Stated Goals "I want to be able to walk a mile"                     Adult Aquatic Therapy - 09/12/16 0953      Aquatic Therapy Subjective   Subjective Pt had no pain during session nor afterwards      O: Pt entered/exited the pool via steps with BUE support on rail.  50 ft =1 lap  Exercises performed in 3'6" depth   Stretches : (Cuing provided for proper alignment) ROM of each joint wall stretch Mini squat-figure 4 (piriformis) hip flexor stretch  on step hip flexor twist on step    4 laps walking back wards w/ BUE on noodles 4 laps minisquat and side stepping   10 reps minisquat with cues for wider BOS and knee alignment to decrease strain. Educated use of this technique with ADLs to minimize back pain and knee pain    Relaxation on noodles under head, armpits, midback, posterior thighs pulled by PT across 2 laps. While guiding pt with body scan mindfulness technique. Educated pt on pain science in preparation of pain management following surgery. Pt voiced understranding. Pt reported feeling "calm, free, and relaxed" following =training   Guided pt on transitioning to stand with minimal strain on neck and back.                    PT Long Term Goals - 09/12/16 0957      PT LONG TERM GOAL #1   Title Pt will improve 10 MWS to .8 M/S to improve functional activity tolerance/gait   Baseline .2 M/S   Time 8   Period Weeks   Status On-going     PT LONG TERM GOAL #2   Title Pt will be I with HEP to improve B hip  strength to 5/5 to improve tolerance for gait   Baseline 3/5 R, 4/5 L   Time 8   Period Weeks   Status On-going     PT LONG TERM GOAL #3   Title pt will be able to sit<>stand with single UE support for decr. balance   Baseline definite use of UE for support for this   Time 8   Period Weeks   Status On-going               Plan - 09/12/16 0954    Clinical Impression Statement Pt progressed to entering the pool via steps using BUE on rail in a 45 deg position to rail. Pt demo'd no difficulties. Pt is progressing well with gait training LE strengthening exercises in the pool. Given pt's centralization of radicular pain and pt's compliance to deep core strengthening, pt may be ready for more more advanced LE strengthening ( steps, heel raises) at upcoming sessions.  Initiated pain science education with mindfulness technique after which pt reports feeling relaxed and calm. Continue to incorporate  pain science education to better prepare pt on pain management following  surgery   Rehab Potential Good   Clinical Impairments Affecting Rehab Potential Pro: motivation. .Con: chronicity of pain   PT Frequency 2x / week   PT Duration 8 weeks   PT Treatment/Interventions ADLs/Self Care Home Management;Aquatic Therapy;Therapeutic activities;Therapeutic exercise;Manual techniques;Gait training;Neuromuscular re-education;Balance training   PT Next Visit Plan aquatic therapy for pain free strengthening   Consulted and Agree with Plan of Care Patient      Patient will benefit from skilled therapeutic intervention in order to improve the following deficits and impairments:  Pain, Improper body mechanics, Postural dysfunction, Difficulty walking, Decreased balance, Decreased strength, Decreased range of motion, Decreased endurance, Hypomobility, Prosthetic Dependency  Visit Diagnosis: Muscle weakness (generalized)  Difficulty in walking, not elsewhere classified  Chronic right-sided low back pain with right-sided sciatica  Pain in right hip     Problem List Patient Active Problem List   Diagnosis Date Noted  . Benign endometrial hyperplasia 07/22/2016  . Obesity (BMI 35.0-39.9 without comorbidity) 07/15/2016  . Thickened endometrium 07/15/2016  . Endometrial polyp 07/15/2016  . Postmenopausal bleeding 07/15/2016  . Class 3 obesity due to excess calories with body mass index (BMI) of 50.0 to 59.9 in adult ([place]) 07/15/2016  . Family history of breast cancer in first degree relative 07/15/2016  . Family history of ovarian cancer 07/15/2016    [name] ,PT, DPT, E-RYT  09/12/2016, 9:58 AM  [place] [facility] SERVICES [address] [place], [place], [address] Phone: [phone]   Fax:  [phone]  Name: Whitney [name] MRN: [id number] Date of Birth: 07-19-1951

## 2016-09-18 ENCOUNTER — Ambulatory Visit: Payer: Medicare Other | Admitting: Physical Therapy

## 2016-09-18 DIAGNOSIS — M25551 Pain in right hip: Secondary | ICD-10-CM

## 2016-09-18 DIAGNOSIS — M6281 Muscle weakness (generalized): Secondary | ICD-10-CM

## 2016-09-18 DIAGNOSIS — M5441 Lumbago with sciatica, right side: Secondary | ICD-10-CM

## 2016-09-18 DIAGNOSIS — R262 Difficulty in walking, not elsewhere classified: Secondary | ICD-10-CM

## 2016-09-18 DIAGNOSIS — G8929 Other chronic pain: Secondary | ICD-10-CM

## 2016-09-19 NOTE — Therapy (Addendum)
[place] [facility] SERVICES [address] [place], [place], [address] Phone: [phone]   Fax:  [phone]  Physical Therapy Treatment  Patient Details  Name: Whitney [name] MRN: [id number] Date of Birth: [birth year] Referring Provider: Gurney Maxin  Encounter Date: 09/18/2016      PT End of Session - 09/20/16 1728    Visit Number 9   Number of Visits 17   Date for PT Re-Evaluation 11/11/16   Authorization Type g code   Authorization - Visit Number 6   PT Start Time 0920   PT Stop Time 0945   PT Time Calculation (min) 25 min   Activity Tolerance Patient tolerated treatment well;No increased pain;Patient limited by fatigue   Behavior During Therapy [facility] for tasks assessed/performed      Past Medical History:  Diagnosis Date  . Abnormal Pap smear of cervix    Pt states she had colposcopy   . Hypertension   . PMB (postmenopausal bleeding)     Past Surgical History:  Procedure Laterality Date  . KIDNEY SURGERY  1979    There were no vitals filed for this visit.      Subjective Assessment - 09/20/16 1727    Subjective Pt reports she has been walking without her SPC in her house sometimes.  She is feeling good today but her bus arrived late to take her to the pool.    Pertinent History leg weakness   How long can you sit comfortably? forever   How long can you stand comfortably? 5 minutes   How long can you walk comfortably? "not at all"   Diagnostic tests imaging - see scanned docs   Patient Stated Goals "I want to be able to walk a mile"                     Adult Aquatic Therapy - 09/20/16 1728      Aquatic Therapy Subjective   Subjective Pt had no pain during session nor afterwards      O: Pt entered/exited the pool via steps with single UE support on rail.  50 ft =1 lap  Exercises performed in 3'6" depth      Stretches : (Cuing provided for proper alignment) ROM of each joint wall  stretch Mini squat-figure 4 (piriformis) hip flexor stretch on step hip flexor twist on step    2 laps walking sidestepping + minisquat with noodles  2 laps grape vine L /R   relaxation seated on noodles                 PT Long Term Goals - 09/12/16 0957      PT LONG TERM GOAL #1   Title Pt will improve 10 MWS to .8 M/S to improve functional activity tolerance/gait   Baseline .2 M/S   Time 8   Period Weeks   Status On-going     PT LONG TERM GOAL #2   Title Pt will be I with HEP to improve B hip strength to 5/5 to improve tolerance for gait   Baseline 3/5 R, 4/5 L   Time 8   Period Weeks   Status On-going     PT LONG TERM GOAL #3   Title pt will be able to sit<>stand with single UE support for decr. balance   Baseline definite use of UE for support for this   Time 8   Period Weeks   Status On-going  Plan - 09/20/16 1729    Clinical Impression Statement Pt demo'd increased stride length when walking on land with her SPC. Pt progressed to dynamic balance training in today's pool session. Pt continues to be compliant with her deep core exercises and has not had any radiating pain . Pt continues to benefit from skilled PT.     Rehab Potential Good   Clinical Impairments Affecting Rehab Potential Pro: motivation. .Con: chronicity of pain   PT Frequency 2x / week   PT Duration 8 weeks   PT Treatment/Interventions ADLs/Self Care Home Management;Aquatic Therapy;Therapeutic activities;Therapeutic exercise;Manual techniques;Gait training;Neuromuscular re-education;Balance training   PT Next Visit Plan aquatic therapy for pain free strengthening      Patient will benefit from skilled therapeutic intervention in order to improve the following deficits and impairments:  Pain, Improper body mechanics, Postural dysfunction, Difficulty walking, Decreased balance, Decreased strength, Decreased range of motion, Decreased endurance, Hypomobility,  Prosthetic Dependency  Visit Diagnosis: Muscle weakness (generalized)  Difficulty in walking, not elsewhere classified  Chronic right-sided low back pain with right-sided sciatica  Pain in right hip     Problem List Patient Active Problem List   Diagnosis Date Noted  . Benign endometrial hyperplasia 07/22/2016  . Obesity (BMI 35.0-39.9 without comorbidity) 07/15/2016  . Thickened endometrium 07/15/2016  . Endometrial polyp 07/15/2016  . Postmenopausal bleeding 07/15/2016  . Class 3 obesity due to excess calories with body mass index (BMI) of 50.0 to 59.9 in adult ([place]) 07/15/2016  . Family history of breast cancer in first degree relative 07/15/2016  . Family history of ovarian cancer 07/15/2016    [name] ,PT, DPT, E-RYT  09/20/2016, 5:32 PM  [place] MA[facility] SERVICES [address] [place], [place], [address] Phone: [phone]   Fax:  [phone]  Name: Whitney [name] MRN: [id number] Date of Birth: [birth year]

## 2016-09-30 ENCOUNTER — Ambulatory Visit: Payer: Medicare Other | Admitting: Physical Therapy

## 2016-10-06 ENCOUNTER — Ambulatory Visit
Admission: RE | Admit: 2016-10-06 | Discharge: 2016-10-06 | Disposition: A | Discharge status: Home / Self Care | Payer: Medicare Other | Source: Ambulatory Visit | Attending: Internal Medicine | Admitting: Internal Medicine

## 2016-10-06 DIAGNOSIS — N6489 Other specified disorders of breast: Secondary | ICD-10-CM | POA: Diagnosis present

## 2016-10-06 DIAGNOSIS — R928 Other abnormal and inconclusive findings on diagnostic imaging of breast: Secondary | ICD-10-CM

## 2016-10-06 NOTE — Addendum Note (Signed)
Encounter addended by: Wendi Maya, RT on: 10/06/2016  2:12 PM<BR>    Actions taken: Imaging Exam ended

## 2016-10-07 ENCOUNTER — Ambulatory Visit: Payer: Medicare Other | Attending: Neurology | Admitting: Physical Therapy

## 2016-10-07 DIAGNOSIS — R262 Difficulty in walking, not elsewhere classified: Secondary | ICD-10-CM | POA: Diagnosis present

## 2016-10-07 DIAGNOSIS — M5441 Lumbago with sciatica, right side: Secondary | ICD-10-CM | POA: Diagnosis present

## 2016-10-07 DIAGNOSIS — G8929 Other chronic pain: Secondary | ICD-10-CM | POA: Insufficient documentation

## 2016-10-07 DIAGNOSIS — M6281 Muscle weakness (generalized): Secondary | ICD-10-CM | POA: Insufficient documentation

## 2016-10-07 DIAGNOSIS — M25551 Pain in right hip: Secondary | ICD-10-CM | POA: Insufficient documentation

## 2016-10-09 NOTE — Therapy (Signed)
[place] [facility] SERVICES [address] [place], [place], [address] Phone: [phone]   Fax:  [phone]  Physical Therapy Treatment  Patient Details  Name: Whitney [name] MRN: [id number] Date of Birth: June 09, 1951 Referring Provider: Gurney Maxin  Encounter Date: 10/07/2016    Past Medical History:  Diagnosis Date  . Abnormal Pap smear of cervix    Pt states she had colposcopy   . Hypertension   . PMB (postmenopausal bleeding)     Past Surgical History:  Procedure Laterality Date  . KIDNEY SURGERY  1979    There were no vitals filed for this visit.                   Adult Aquatic Therapy - 10/09/16 0114      Aquatic Therapy Subjective   Subjective Pt had no pain during session nor afterwards         O: Pt entered via steps with single UE on rial/exited the pool via steps with no UE support on rail.  50 ft =1 lap  Exercises performed in 3'6" depth    Stretches : (Cuing provided for proper alignment) ROM of each joint wall stretch Mini squat-figure 4 (piriformis) hip flexor stretch on step hip flexor twist on step   2 laps walking sidestepping + minisquat with noodles  4 laps backward walking with noodles  Planking on bench, hip ext with cue for decreased lumbar lordosis, 10 x 2 reps each side.  relaxation seated on noodles                    PT Long Term Goals - 09/12/16 0957      PT LONG TERM GOAL #1   Title Pt will improve 10 MWS to .8 M/S to improve functional activity tolerance/gait   Baseline .2 M/S   Time 8   Period Weeks   Status On-going     PT LONG TERM GOAL #2   Title Pt will be I with HEP to improve B hip strength to 5/5 to improve tolerance for gait   Baseline 3/5 R, 4/5 L   Time 8   Period Weeks   Status On-going     PT LONG TERM GOAL #3   Title pt will be able to sit<>stand with single UE support for decr. balance   Baseline definite use of UE for  support for this   Time 8   Period Weeks   Status On-going               Plan - 10/09/16 0114    Clinical Impression Statement Pt demo'd signifciantly improved gait mechanics, with decreased use of cane when walking outside of pool. Pt demo'd ability to descend steps with single UE on rail and walk up ramp without BUE support on rail. Pt showed endurance with activtities in the pool .  Pt continues to benefit from skilled PT to progress to 180 / 360 deg turns and perturbation training to improve balance at next session.    Rehab Potential Good   Clinical Impairments Affecting Rehab Potential Pro: motivation. .Con: chronicity of pain   PT Frequency 2x / week   PT Duration 8 weeks   PT Treatment/Interventions ADLs/Self Care Home Management;Aquatic Therapy;Therapeutic activities;Therapeutic exercise;Manual techniques;Gait training;Neuromuscular re-education;Balance training   PT Next Visit Plan aquatic therapy for pain free strengthening      Patient will benefit from skilled therapeutic intervention in order to improve the following  deficits and impairments:  Pain, Improper body mechanics, Postural dysfunction, Difficulty walking, Decreased balance, Decreased strength, Decreased range of motion, Decreased endurance, Hypomobility, Prosthetic Dependency  Visit Diagnosis: Muscle weakness (generalized)  Difficulty in walking, not elsewhere classified  Chronic right-sided low back pain with right-sided sciatica  Pain in right hip     Problem List Patient Active Problem List   Diagnosis Date Noted  . Benign endometrial hyperplasia 07/22/2016  . Obesity (BMI 35.0-39.9 without comorbidity) 07/15/2016  . Thickened endometrium 07/15/2016  . Endometrial polyp 07/15/2016  . Postmenopausal bleeding 07/15/2016  . Class 3 obesity due to excess calories with body mass index (BMI) of 50.0 to 59.9 in adult ([place]) 07/15/2016  . Family history of breast cancer in first degree relative  07/15/2016  . Family history of ovarian cancer 07/15/2016    [name] ,PT, DPT, E-RYT  10/09/2016, 1:17 AM  [place] MAIN [facility] SERVICES [address] [place], [place], [address] Phone: [phone]   Fax:  [phone]  Name: Deanne Bedgood MRN: [id number] Date of Birth: 08/17/1950

## 2016-10-14 ENCOUNTER — Ambulatory Visit: Payer: Medicare Other | Admitting: Physical Therapy

## 2016-10-16 ENCOUNTER — Ambulatory Visit: Payer: Medicare Other | Admitting: Physical Therapy

## 2016-10-21 ENCOUNTER — Encounter: Payer: Self-pay | Admitting: Obstetrics and Gynecology

## 2016-10-21 ENCOUNTER — Encounter: Payer: Medicare Other | Admitting: Physical Therapy

## 2016-10-21 ENCOUNTER — Ambulatory Visit (INDEPENDENT_AMBULATORY_CARE_PROVIDER_SITE_OTHER): Payer: Medicare Other | Admitting: Obstetrics and Gynecology

## 2016-10-21 VITALS — BP 193/76 | HR 109 | Ht 65.0 in | Wt 259.0 lb

## 2016-10-21 DIAGNOSIS — E6609 Other obesity due to excess calories: Secondary | ICD-10-CM | POA: Diagnosis not present

## 2016-10-21 DIAGNOSIS — Z6841 Body Mass Index (BMI) 40.0 and over, adult: Secondary | ICD-10-CM

## 2016-10-21 DIAGNOSIS — N950 Postmenopausal bleeding: Secondary | ICD-10-CM

## 2016-10-21 DIAGNOSIS — N8501 Benign endometrial hyperplasia: Secondary | ICD-10-CM

## 2016-10-21 DIAGNOSIS — IMO0001 Reserved for inherently not codable concepts without codable children: Secondary | ICD-10-CM

## 2016-10-21 NOTE — Progress Notes (Signed)
Chief complaint: 1. Postmenopausal bleeding 2. History of endometrial hyperplasia, benign  Patient presents for follow-up. 3 months ago she was placed on Megace 40 mg twice a day. She continues to have abnormal uterine bleeding on an almost daily basis. She is taking her medicine as prescribed. Previous endometrial biopsy demonstrated benign endometrial hyperplasia and fragments of an endometrial polyp.  OBJECTIVE: BP (!) 193/76   Pulse (!) 109   Ht 5\' 5"  (1.651 m)   Wt 259 lb (117.5 kg)   LMP 10/02/2016 (Exact Date)   BMI 43.10 kg/m  Physical exam-deferred  ASSESSMENT: 1. History of benign endometrial hyperplasia and endometrial polyps on biopsy 12 2017 2. Persistent ongoing postmenopausal bleeding 3. Morbid obesity 4. Family history of ovarian cancer  PLAN: 1. Continue Megace 40 mg twice a day 2. Hysteroscopy/D&C is scheduled for further evaluation and management of bleeding 3. Return in 1 week prior surgery for preoperative appointment  A total of 15 minutes were spent face-to-face with the patient during this encounter and over half of that time dealt with counseling and coordination of care.  Brayton Mars, MD  Note: This dictation was prepared with Dragon dictation along with smaller phrase technology. Any transcriptional errors that result from this process are unintentional.

## 2016-10-21 NOTE — Patient Instructions (Signed)
1. Hysteroscopy/D&C is scheduled to assess persistent postmenopausal bleeding 2. Continue taking Megace 40 mg twice a day 3. Return for preoperative appointment the week before surgery   Dilation and Curettage or Vacuum Curettage Dilation and curettage (D&C) and vacuum curettage are minor procedures. A D&C involves stretching (dilation) the cervix and scraping (curettage) the inside lining of the uterus (endometrium). During a D&C, tissue is gently scraped from the endometrium, starting from the top portion of the uterus down to the lowest part of the uterus (cervix). During a vacuum curettage, the lining and tissue in the uterus are removed with the use of gentle suction. Curettage may be performed to either diagnose or treat a problem. As a diagnostic procedure, curettage is performed to examine tissues from the uterus. A diagnostic curettage may be done if you have:  Irregular bleeding in the uterus.  Bleeding with the development of clots.  Spotting between menstrual periods.  Prolonged menstrual periods or other abnormal bleeding.  Bleeding after menopause.  No menstrual period (amenorrhea).  A change in size and shape of the uterus.  Abnormal endometrial cells discovered during a Pap test. As a treatment procedure, curettage may be performed for the following reasons:  Removal of an IUD (intrauterine device).  Removal of retained placenta after giving birth.  Abortion.  Miscarriage.  Removal of endometrial polyps.  Removal of uncommon types of noncancerous lumps (fibroids). Tell a health care provider about:  Any allergies you have, including allergies to prescribed medicine or latex.  All medicines you are taking, including vitamins, herbs, eye drops, creams, and over-the-counter medicines. This is especially important if you take any blood-thinning medicine. Bring a list of all of your medicines to your appointment.  Any problems you or family members have had with  anesthetic medicines.  Any blood disorders you have.  Any surgeries you have had.  Your medical history and any medical conditions you have.  Whether you are pregnant or may be pregnant.  Recent vaginal infections you have had.  Recent menstrual periods, bleeding problems you have had, and what form of birth control (contraception) you use. What are the risks? Generally, this is a safe procedure. However, problems may occur, including:  Infection.  Heavy vaginal bleeding.  Allergic reactions to medicines.  Damage to the cervix or other structures or organs.  Development of scar tissue (adhesions) inside the uterus, which can cause abnormal amounts of menstrual bleeding. This may make it harder to get pregnant in the future.  A hole (perforation) or puncture in the uterine wall. This is rare. What happens before the procedure? Staying hydrated  Follow instructions from your health care provider about hydration, which may include:  Up to 2 hours before the procedure - you may continue to drink clear liquids, such as water, clear fruit juice, black coffee, and plain tea. Eating and drinking restrictions  Follow instructions from your health care provider about eating and drinking, which may include:  8 hours before the procedure - stop eating heavy meals or foods such as meat, fried foods, or fatty foods.  6 hours before the procedure - stop eating light meals or foods, such as toast or cereal.  6 hours before the procedure - stop drinking milk or drinks that contain milk.  2 hours before the procedure - stop drinking clear liquids. If your health care provider told you to take your medicine(s) on the day of your procedure, take them with only a sip of water. Medicines   Ask  your health care provider about:  Changing or stopping your regular medicines. This is especially important if you are taking diabetes medicines or blood thinners.  Taking medicines such as aspirin  and ibuprofen. These medicines can thin your blood. Do not take these medicines before your procedure if your health care provider instructs you not to.  You may be given antibiotic medicine to help prevent infection. General instructions   For 24 hours before your procedure, do not:  [place].  Use tampons.  Use medicines, creams, or suppositories in the vagina.  Have sexual intercourse.  You may be given a pregnancy test on the day of the procedure.  Plan to have someone take you home from the hospital or clinic.  You may have a blood or urine sample taken.  If you will be going home right after the procedure, plan to have someone with you for 24 hours. What happens during the procedure?  To reduce your risk of infection:  Your health care team will wash or sanitize their hands.  Your skin will be washed with soap.  An IV tube will be inserted into one of your veins.  You will be given one of the following:  A medicine that numbs the area in and around the cervix (local anesthetic).  A medicine to make you fall asleep (general anesthetic).  You will lie down on your back, with your feet in foot rests (stirrups).  The size and position of your uterus will be checked.  A lubricated instrument (speculum or Sims retractor) will be inserted into the back side of your vagina. The speculum will be used to hold apart the walls of your vagina so your health care provider can see your cervix.  A tool (tenaculum) will be attached to the lip of the cervix to stabilize it.  Your cervix will be softened and dilated. This may be done by:  Taking a medicine.  Having tapered dilators or thin rods (laminaria) or gradual widening instruments (tapered dilators) inserted into your cervix.  A small, sharp, curved instrument (curette) will be used to scrape a small amount of tissue or cells from the endometrium or cervical canal. In some cases, gentle suction is applied with the curette.  The curette will then be removed. The cells will be taken to a lab for testing. The procedure may vary among health care providers and hospitals. What happens after the procedure?  You may have mild cramping, backache, pain, and light bleeding or spotting. You may pass small blood clots from your vagina.  You may have to wear compression stockings. These stockings help to prevent blood clots and reduce swelling in your legs.  Your blood pressure, heart rate, breathing rate, and blood oxygen level will be monitored until the medicines you were given have worn off. Summary  Dilation and curettage (D&C) involves stretching (dilation) the cervix and scraping (curettage) the inside lining of the uterus (endometrium).  After the procedure, you may have mild cramping, backache, pain, and light bleeding or spotting. You may pass small blood clots from your vagina.  Plan to have someone take you home from the hospital or clinic. This information is not intended to replace advice given to you by your health care provider. Make sure you discuss any questions you have with your health care provider. Document Released: 07/21/2005 Document Revised: 04/06/2016 Document Reviewed: 04/06/2016 Elsevier Interactive Patient Education  2017 [place]. Hysteroscopy Hysteroscopy is a procedure used for looking inside the womb (uterus). It  may be done for various reasons, including:  To evaluate abnormal bleeding, fibroid (benign, noncancerous) tumors, polyps, scar tissue (adhesions), and possibly cancer of the uterus.  To look for lumps (tumors) and other uterine growths.  To look for causes of why a woman cannot get pregnant (infertility), causes of recurrent loss of pregnancy (miscarriages), or a lost intrauterine device (IUD).  To perform a sterilization by blocking the fallopian tubes from inside the uterus. In this procedure, a thin, flexible tube with a tiny light and camera on the end of it  (hysteroscope) is used to look inside the uterus. A hysteroscopy should be done right after a menstrual period to be sure you are not pregnant. LET [facility] CARE PROVIDER KNOW ABOUT:  Any allergies you have.  All medicines you are taking, including vitamins, herbs, eye drops, creams, and over-the-counter medicines.  Previous problems you or members of your family have had with the use of anesthetics.  Any blood disorders you have.  Previous surgeries you have had.  Medical conditions you have. RISKS AND COMPLICATIONS Generally, this is a safe procedure. However, as with any procedure, complications can occur. Possible complications include:  Putting a hole in the uterus.  Excessive bleeding.  Infection.  Damage to the cervix.  Injury to other organs.  Allergic reaction to medicines.  Too much fluid used in the uterus for the procedure. BEFORE THE PROCEDURE  Ask your health care provider about changing or stopping any regular medicines.  Do not take aspirin or blood thinners for 1 week before the procedure, or as directed by your health care provider. These can cause bleeding.  If you smoke, do not smoke for 2 weeks before the procedure.  In some cases, a medicine is placed in the cervix the day before the procedure. This medicine makes the cervix have a larger opening (dilate). This makes it easier for the instrument to be inserted into the uterus during the procedure.  Do not eat or drink anything for at least 8 hours before the surgery.  Arrange for someone to take you home after the procedure. PROCEDURE  You may be given a medicine to relax you (sedative). You may also be given one of the following:  A medicine that numbs the area around the cervix (local anesthetic).  A medicine that makes you sleep through the procedure (general anesthetic).  The hysteroscope is inserted through the vagina into the uterus. The camera on the hysteroscope sends a picture to a  TV screen. This gives the surgeon a good view inside the uterus.  During the procedure, air or a liquid is put into the uterus, which allows the surgeon to see better.  Sometimes, tissue is gently scraped from inside the uterus. These tissue samples are sent to a lab for testing. What to expect after the procedure  If you had a general anesthetic, you may be groggy for a couple hours after the procedure.  If you had a local anesthetic, you will be able to go home as soon as you are stable and feel ready.  You may have some cramping. This normally lasts for a couple days.  You may have bleeding, which varies from light spotting for a few days to menstrual-like bleeding for 3-7 days. This is normal.  If your test results are not back during the visit, make an appointment with your health care provider to find out the results. This information is not intended to replace advice given to you by your  health care provider. Make sure you discuss any questions you have with your health care provider. Document Released: 10/27/2000 Document Revised: 12/27/2015 Document Reviewed: 02/17/2013 Elsevier Interactive Patient Education  2017 [employer].

## 2016-10-23 ENCOUNTER — Ambulatory Visit: Payer: Medicare Other

## 2016-10-23 DIAGNOSIS — M6281 Muscle weakness (generalized): Secondary | ICD-10-CM | POA: Diagnosis not present

## 2016-10-23 DIAGNOSIS — R262 Difficulty in walking, not elsewhere classified: Secondary | ICD-10-CM

## 2016-10-23 NOTE — Therapy (Signed)
[facility] [address], [place], [address] Phone: [phone]   Fax:  [phone]  Physical Therapy Treatment  Patient Details  Name: Whitney [name] MRN: [id number] Date of Birth: 1950-12-01 Referring Provider: Gurney Maxin  Encounter Date: 10/23/2016      PT End of Session - 10/26/16 2024    Visit Number 11   Number of Visits 41   Date for PT Re-Evaluation 01/15/17   Authorization Type g code 1/10   PT Start Time 1155   PT Stop Time 1233   PT Time Calculation (min) 38 min   Activity Tolerance Patient tolerated treatment well   Behavior During Therapy [facility] for tasks assessed/performed      Past Medical History:  Diagnosis Date  . Abnormal Pap smear of cervix    Pt states she had colposcopy   . Hypertension   . PMB (postmenopausal bleeding)     Past Surgical History:  Procedure Laterality Date  . KIDNEY SURGERY  1979    There were no vitals filed for this visit.          [facility] PT Assessment - 10/26/16 2015      Strength   Overall Strength Deficits   Right/Left Hip Right;Left   Right Hip Flexion 4-/5   Right Hip ABduction 4-/5   Right Hip ADduction 4/5   Left Hip Flexion 4-/5   Left Hip ABduction 4-/5   Left Hip ADduction 4/5   Right/Left Knee Right;Left   Right Knee Flexion 5/5   Right Knee Extension 5/5   Left Knee Flexion 4+/5   Left Knee Extension 5/5   Right/Left Ankle Right;Left   Right Ankle Dorsiflexion 4/5   Left Ankle Dorsiflexion 4/5     6 Minute Walk- Baseline   6 Minute Walk- Baseline yes   BP (mmHg) 151/82   HR (bpm) 70   02 Sat (%RA) 100 %   Modified Borg Scale for Dyspnea 0- Nothing at all   Perceived Rate of Exertion (Borg) 6-     6 Minute walk- Post Test   6 Minute Walk Post Test yes   BP (mmHg) 181/78   HR (bpm) 101   02 Sat (%RA) 91 %  Increases to 98% after 2 minutes   Modified Borg Scale for Dyspnea 7- Severe shortness of breath or very hard  breathing   Perceived Rate of Exertion (Borg) 17- Very hard     6 minute walk test results    Aerobic Endurance Distance Walked 925   Endurance additional comments Pt diaphoretic and SOB at end of walk. Has difficulty completing full sentences     Standardized Balance Assessment   Standardized Balance Assessment Five Times Sit to Stand;10 meter walk test   Five times sit to stand comments  16.43s   10 Meter Walk 12.1 seconds = 0.83 m/s        TREATMENT  Therapeutic Exercise MMT of bilateral LEs: hip flexion 4-/5 bilateral, R knee flex/ext 5/5, L knee ext: 5/5, L knee flexion 4+/5, bilateral ankle DF 4/5, hip abduction 4-/5 bilateral, hip adduction 4/5, hip IR/ER: 4/5 bilateral; 10 meter walk test: 12.1 seconds = 0.83 m/s 6MWT: 925' (see flowsheet for detials) Goals updated and plan of care discussed with patient;                         PT Education - 10/26/16 2029    Education provided Yes  Education Details HEP reinforced, plan of care, goals   Person(s) Educated Patient   Methods Explanation   Comprehension Verbalized understanding             PT Long Term Goals - 10/26/16 2014      PT LONG TERM GOAL #1   Title Pt will improve 10 MWS to .8 M/S to improve functional activity tolerance/gait   Baseline .2 M/S, 10/26/16: 0.83 m/s   Time 8   Period Weeks   Status Achieved     PT LONG TERM GOAL #2   Title Pt will be I with HEP to improve B hip strength to 5/5 to improve tolerance for gait   Baseline 3/5 R, 4/5 L; 10/23/16: hip flex: 4-/5 bil, hip abduction: 4-/5, hip adduction 4/5   Time 12   Period Weeks   Status On-going     PT LONG TERM GOAL #3   Title pt will be able to sit<>stand with single UE support for decr. balance   Baseline definite use of UE for support for this   Time 8   Period Weeks   Status Achieved     PT LONG TERM GOAL #4   Title Pt will improve 10 MWS to 1.0 M/S to improve functional activity tolerance/gait   Baseline  0.2 m/s, 10/23/16: 0.83 m/s   Time 12   Period Weeks   Status New     PT LONG TERM GOAL #5   Title Pt will decrease 5TSTS by at least 3 seconds in order to demonstrate clinically significant improvement in LE strength    Baseline 10/23/16: 16.43 s   Time 12   Period Weeks   Status New     Additional Long Term Goals   Additional Long Term Goals Yes     PT LONG TERM GOAL #6   Title Pt will increase 6MWT by at least 63m (165ft) in order to demonstrate clinically significant improvement in cardiopulmonary endurance and community ambulation   Baseline 10/23/16: 925'   Time 12   Status New               Plan - 10/26/16 2024    Clinical Impression Statement Pt demonstrates significant improvement in gait speed and LE strength since her initial evaluation. Her 6MWT is 64' which is below age/gender norms. Pt is very motivated to continue her work with therapy. She reports that she has a gyn surgery scheduled in the next couple weeks and will notify office if she needs to cancel any upcoming appointments. She will continue to benefit from skilled PT services to address remaining deficits in strength and endurance in order to improve function at home.    Rehab Potential Good   Clinical Impairments Affecting Rehab Potential Pro: motivation. .Con: chronicity of pain   PT Frequency 2x / week   PT Duration 12 weeks   PT Treatment/Interventions ADLs/Self Care Home Management;Aquatic Therapy;Therapeutic activities;Therapeutic exercise;Manual techniques;Gait training;Neuromuscular re-education;Balance training;DME Instruction;Patient/family education   PT Next Visit Plan Review HEP in greater details and update as appropriate, land-based strengthening program   PT Home Exercise Plan side stepping, seated hip flexion holds, seated anterior pelvic tilts   Consulted and Agree with Plan of Care Patient      Patient will benefit from skilled therapeutic intervention in order to improve the  following deficits and impairments:  Pain, Improper body mechanics, Postural dysfunction, Difficulty walking, Decreased balance, Decreased strength, Decreased range of motion, Decreased endurance, Hypomobility, Prosthetic Dependency  Visit Diagnosis:  Muscle weakness (generalized)  Difficulty in walking, not elsewhere classified  G codes taken November 22, 2016     G-Codes - 11-25-2016 December 01, 2031    Functional Assessment Tool Used (Outpatient Only) 54m gait speed, MMT, 5TSTS, 6MWT, clinical judgment   Functional Limitation Mobility: Walking and moving around   Mobility: Walking and Moving Around Current Status [phone]) At least 40 percent but less than 60 percent impaired, limited or restricted   Mobility: Walking and Moving Around Goal Status [phone]) At least 20 percent but less than 40 percent impaired, limited or restricted      Problem List Patient Active Problem List   Diagnosis Date Noted  . Benign endometrial hyperplasia 07/22/2016  . Obesity (BMI 35.0-39.9 without comorbidity) 07/15/2016  . Thickened endometrium 07/15/2016  . Endometrial polyp 07/15/2016  . Postmenopausal bleeding 07/15/2016  . Class 3 obesity due to excess calories with body mass index (BMI) of 50.0 to 59.9 in adult ([place]) 07/15/2016  . Family history of breast cancer in first degree relative 07/15/2016  . Family history of ovarian cancer 07/15/2016   Phillips Grout PT, DPT   [place] November 25, 2016, 8:36 PM  [facility] [id number] [place], [address] Phone: [phone]   Fax:  [phone]  Name: Whitney [name] MRN: [id number] Date of Birth: September 17, 1950

## 2016-10-26 NOTE — Addendum Note (Signed)
Addended by: Roxana Hires D on: 10/26/2016 08:40 PM   Modules accepted: Orders

## 2016-10-27 ENCOUNTER — Ambulatory Visit: Payer: Medicare Other

## 2016-10-27 DIAGNOSIS — M6281 Muscle weakness (generalized): Secondary | ICD-10-CM | POA: Diagnosis not present

## 2016-10-27 DIAGNOSIS — R262 Difficulty in walking, not elsewhere classified: Secondary | ICD-10-CM

## 2016-10-27 NOTE — Therapy (Signed)
[facility] [address] [place], [address] Phone: [phone]   Fax:  [phone]  Physical Therapy Treatment  Patient Details  Name: Whitney [name] MRN: [id number] Date of Birth: [birth year] Referring Provider: Gurney Maxin  Encounter Date: 10/27/2016      PT End of Session - 10/27/16 1130    Visit Number 12   Number of Visits 41   Date for PT Re-Evaluation 01/15/17   Authorization Type g code 2/10   PT Start Time 1120   PT Stop Time 1205   PT Time Calculation (min) 45 min   Activity Tolerance Patient tolerated treatment well   Behavior During Therapy [facility] for tasks assessed/performed      Past Medical History:  Diagnosis Date  . Abnormal Pap smear of cervix    Pt states she had colposcopy   . Hypertension   . PMB (postmenopausal bleeding)     Past Surgical History:  Procedure Laterality Date  . KIDNEY SURGERY  1979    There were no vitals filed for this visit.      Subjective Assessment - 10/27/16 1125    Subjective Pt complains of increased L knee pain on this date. HEP is going well. No specific questions or complaints at this time.    Pertinent History leg weakness   How long can you sit comfortably? forever   How long can you stand comfortably? 5 minutes   How long can you walk comfortably? "not at all"   Diagnostic tests imaging - see scanned docs   Patient Stated Goals "I want to be able to walk a mile"   Currently in Pain? Yes   Pain Score 7    Pain Location Knee   Pain Orientation Left   Pain Descriptors / Indicators Sore   Pain Type Chronic pain   Pain Onset More than a month ago          TREATMENT  Therapeutic Exercise NuStep L1 x 3 minutes, L2 x 2 minutes during history; Total Gym squats L 20 2 x 10; Total Gym heel raises L 15 2 x 10; Side stepping with bilateral UE support, red Tband above the knees 4 lengths x 2; Hooklying bridges 2 x 15; Sidelying hip  abduction bilateral 2 x 10; Sidelying clams with manual resistance bilateral 2 x 10 UBE high intensity intervals 30s on/30s off x 3 minutes total, 1 minute cooldown at end;                   PT Education - 10/27/16 1127    Education provided Yes   Education Details HEP reinforced and progressed   Person(s) Educated Patient   Methods Explanation   Comprehension Verbalized understanding             PT Long Term Goals - 10/26/16 2014      PT LONG TERM GOAL #1   Title Pt will improve 10 MWS to .8 M/S to improve functional activity tolerance/gait   Baseline .2 M/S, 10/26/16: 0.83 m/s   Time 8   Period Weeks   Status Achieved     PT LONG TERM GOAL #2   Title Pt will be I with HEP to improve B hip strength to 5/5 to improve tolerance for gait   Baseline 3/5 R, 4/5 L; 10/23/16: hip flex: 4-/5 bil, hip abduction: 4-/5, hip adduction 4/5   Time 12   Period Weeks   Status On-going  PT LONG TERM GOAL #3   Title pt will be able to sit<>stand with single UE support for decr. balance   Baseline definite use of UE for support for this   Time 8   Period Weeks   Status Achieved     PT LONG TERM GOAL #4   Title Pt will improve 10 MWS to 1.0 M/S to improve functional activity tolerance/gait   Baseline 0.2 m/s, 10/23/16: 0.83 m/s   Time 12   Period Weeks   Status New     PT LONG TERM GOAL #5   Title Pt will decrease 5TSTS by at least 3 seconds in order to demonstrate clinically significant improvement in LE strength    Baseline 10/23/16: 16.43 s   Time 12   Period Weeks   Status New     Additional Long Term Goals   Additional Long Term Goals Yes     PT LONG TERM GOAL #6   Title Pt will increase 6MWT by at least 84m (143ft) in order to demonstrate clinically significant improvement in cardiopulmonary endurance and community ambulation   Baseline 10/23/16: 925'   Time 12   Status New               Plan - 10/27/16 1131    Clinical Impression Statement Pt  continues to demonstrate excellent effort with physical therapy on this date. She is able to perform sit to stand on Total Gym but is fatigued by the tenth repetition. Added sit to stand, sidelying hip abduction, and ooklying bridges to HEP. Follow-up as scheduled.    Rehab Potential Good   Clinical Impairments Affecting Rehab Potential Pro: motivation. .Con: chronicity of pain   PT Frequency 2x / week   PT Duration 12 weeks   PT Treatment/Interventions ADLs/Self Care Home Management;Aquatic Therapy;Therapeutic activities;Therapeutic exercise;Manual techniques;Gait training;Neuromuscular re-education;Balance training;DME Instruction;Patient/family education   PT Next Visit Plan Review HEP in greater details and update as appropriate, land-based strengthening program   PT Home Exercise Plan Sidelying hip abduction 2 x 10 qd, hooklying bridges 2 x 15 qd, sit to stand 2 x 5 BID   Consulted and Agree with Plan of Care Patient      Patient will benefit from skilled therapeutic intervention in order to improve the following deficits and impairments:  Pain, Improper body mechanics, Postural dysfunction, Difficulty walking, Decreased balance, Decreased strength, Decreased range of motion, Decreased endurance, Hypomobility, Prosthetic Dependency, Obesity  Visit Diagnosis: Muscle weakness (generalized)  Difficulty in walking, not elsewhere classified       G-Codes - 2016/11/15 11/21/31    Functional Assessment Tool Used (Outpatient Only) 19m gait speed, MMT, 5TSTS, 6MWT, clinical judgment   Functional Limitation Mobility: Walking and moving around   Mobility: Walking and Moving Around Current Status [phone]) At least 40 percent but less than 60 percent impaired, limited or restricted   Mobility: Walking and Moving Around Goal Status [phone]) At least 20 percent but less than 40 percent impaired, limited or restricted      Problem List Patient Active Problem List   Diagnosis Date Noted  . Benign  endometrial hyperplasia 07/22/2016  . Obesity (BMI 35.0-39.9 without comorbidity) 07/15/2016  . Thickened endometrium 07/15/2016  . Endometrial polyp 07/15/2016  . Postmenopausal bleeding 07/15/2016  . Class 3 obesity due to excess calories with body mass index (BMI) of 50.0 to 59.9 in adult ([place]) 07/15/2016  . Family history of breast cancer in first degree relative 07/15/2016  . Family history of ovarian cancer  07/15/2016   Phillips Grout PT, DPT   Huprich,Jason 10/27/2016, 12:06 PM  [facility] [address], [place], [address] Phone: [phone]   Fax:  [phone]  Name: Whitney [name] MRN: [id number] Date of Birth: 16-Jul-1951

## 2016-11-03 ENCOUNTER — Ambulatory Visit: Payer: Medicare Other | Attending: Neurology

## 2016-11-03 VITALS — BP 155/84 | HR 86

## 2016-11-03 DIAGNOSIS — M5441 Lumbago with sciatica, right side: Secondary | ICD-10-CM | POA: Insufficient documentation

## 2016-11-03 DIAGNOSIS — M25551 Pain in right hip: Secondary | ICD-10-CM | POA: Insufficient documentation

## 2016-11-03 DIAGNOSIS — G8929 Other chronic pain: Secondary | ICD-10-CM | POA: Diagnosis present

## 2016-11-03 DIAGNOSIS — R262 Difficulty in walking, not elsewhere classified: Secondary | ICD-10-CM | POA: Diagnosis present

## 2016-11-03 DIAGNOSIS — M6281 Muscle weakness (generalized): Secondary | ICD-10-CM | POA: Insufficient documentation

## 2016-11-03 NOTE — Therapy (Signed)
[facility] [address], [place], [address] Phone: [phone]   Fax:  [phone]  Physical Therapy Treatment  Patient Details  Name: Whitney [name] MRN: [id number] Date of Birth: 23-Mar-1951 Referring Provider: Gurney Maxin  Encounter Date: 11/03/2016      PT End of Session - 11/03/16 1447    Visit Number 13   Number of Visits 41   Date for PT Re-Evaluation 01/15/17   Authorization Type g code 3/10   PT Start Time 9449   PT Stop Time 1535   PT Time Calculation (min) 52 min   Activity Tolerance Patient tolerated treatment well   Behavior During Therapy [facility] for tasks assessed/performed      Past Medical History:  Diagnosis Date  . Abnormal Pap smear of cervix    Pt states she had colposcopy   . Hypertension   . PMB (postmenopausal bleeding)     Past Surgical History:  Procedure Laterality Date  . KIDNEY SURGERY  1979    Vitals:   11/03/16 1530  BP: (!) 155/84  Pulse: 86  SpO2: 100%        Subjective Assessment - 11/03/16 1446    Subjective Pt states that she is doing well on this date. She reports some burning in her L knee but otherwise no issues. She is performing HEP. No specific questions or concerns at this time.    Pertinent History leg weakness   How long can you sit comfortably? forever   How long can you stand comfortably? 5 minutes   How long can you walk comfortably? "not at all"   Diagnostic tests imaging - see scanned docs   Patient Stated Goals "I want to be able to walk a mile"   Currently in Pain? Yes   Pain Score 4    Pain Location Knee   Pain Orientation Left   Pain Descriptors / Indicators Burning   Pain Type Chronic pain   Pain Onset More than a month ago         TREATMENT   Therapeutic Exercise NuStep L2 x 2 minutes, L3 x 2 minutes, L4 x 1 minutes during history, monitored intensity; Total Gym squats L 20 x 15, L22 2 x 15, cues to go into deeper  squats; Total Gym heel raises L22 2 x 15; Hooklying bridges 2 x 20 Sidelying hip abduction bilateral 2 x 15 LLE, 3 x 10 RLE; Sidelying clams with manual resistance bilateral 2 x 15; Sit to stand without UE support from low mat table 2 x 15; Standing red tband resisted hip abduction x 15 RLE, x 10 LLE, discontinued secondary to pain; UBE high intensity intervals 30s on/30s off x 3 minutes total, 1 minute cooldown at end;                         PT Education - 11/03/16 1447    Education provided Yes   Education Details HEP reinforced   Person(s) Educated Patient   Methods Explanation   Comprehension Verbalized understanding             PT Long Term Goals - 10/26/16 2014      PT LONG TERM GOAL #1   Title Pt will improve 10 MWS to .8 M/S to improve functional activity tolerance/gait   Baseline .2 M/S, 10/26/16: 0.83 m/s   Time 8   Period Weeks   Status Achieved  PT LONG TERM GOAL #2   Title Pt will be I with HEP to improve B hip strength to 5/5 to improve tolerance for gait   Baseline 3/5 R, 4/5 L; 10/23/16: hip flex: 4-/5 bil, hip abduction: 4-/5, hip adduction 4/5   Time 12   Period Weeks   Status On-going     PT LONG TERM GOAL #3   Title pt will be able to sit<>stand with single UE support for decr. balance   Baseline definite use of UE for support for this   Time 8   Period Weeks   Status Achieved     PT LONG TERM GOAL #4   Title Pt will improve 10 MWS to 1.0 M/S to improve functional activity tolerance/gait   Baseline 0.2 m/s, 10/23/16: 0.83 m/s   Time 12   Period Weeks   Status New     PT LONG TERM GOAL #5   Title Pt will decrease 5TSTS by at least 3 seconds in order to demonstrate clinically significant improvement in LE strength    Baseline 10/23/16: 16.43 s   Time 12   Period Weeks   Status New     Additional Long Term Goals   Additional Long Term Goals Yes     PT LONG TERM GOAL #6   Title Pt will increase 6MWT by at least 25m  (178ft) in order to demonstrate clinically significant improvement in cardiopulmonary endurance and community ambulation   Baseline 10/23/16: 925'   Time 12   Status New               Plan - 11/03/16 1447    Clinical Impression Statement Pt reports improvement in her walking since starting therapy. No increase in pain reported during therapy session today. Pt remains highly motivated giving maximal effort at al times. Pt encouraged to continue current HEP and follow-up as scheduled.    Rehab Potential Good   Clinical Impairments Affecting Rehab Potential Pro: motivation. .Con: chronicity of pain   PT Frequency 2x / week   PT Duration 12 weeks   PT Treatment/Interventions ADLs/Self Care Home Management;Aquatic Therapy;Therapeutic activities;Therapeutic exercise;Manual techniques;Gait training;Neuromuscular re-education;Balance training;DME Instruction;Patient/family education   PT Next Visit Plan Review HEP in greater details and update as appropriate, land-based strengthening program   PT Home Exercise Plan Sidelying hip abduction 2 x 10 qd, hooklying bridges 2 x 15 qd, sit to stand 2 x 5 BID   Consulted and Agree with Plan of Care Patient      Patient will benefit from skilled therapeutic intervention in order to improve the following deficits and impairments:  Pain, Improper body mechanics, Postural dysfunction, Difficulty walking, Decreased balance, Decreased strength, Decreased range of motion, Decreased endurance, Hypomobility, Prosthetic Dependency, Obesity  Visit Diagnosis: Muscle weakness (generalized)  Difficulty in walking, not elsewhere classified     Problem List Patient Active Problem List   Diagnosis Date Noted  . Benign endometrial hyperplasia 07/22/2016  . Obesity (BMI 35.0-39.9 without comorbidity) 07/15/2016  . Thickened endometrium 07/15/2016  . Endometrial polyp 07/15/2016  . Postmenopausal bleeding 07/15/2016  . Class 3 obesity due to excess calories  with body mass index (BMI) of 50.0 to 59.9 in adult ([place]) 07/15/2016  . Family history of breast cancer in first degree relative 07/15/2016  . Family history of ovarian cancer 07/15/2016   Phillips Grout PT, DPT   Huprich,Jason 11/03/2016, 3:35 PM  [facility] [address],  [place], [address] Phone: [phone]   Fax:  [phone]  Name: Whitney [name] MRN: [id number] Date of Birth: 1951-01-19

## 2016-11-05 ENCOUNTER — Ambulatory Visit: Payer: Medicare Other | Admitting: Physical Therapy

## 2016-11-10 ENCOUNTER — Ambulatory Visit: Payer: Medicare Other

## 2016-11-10 VITALS — BP 153/100 | HR 92

## 2016-11-10 DIAGNOSIS — R262 Difficulty in walking, not elsewhere classified: Secondary | ICD-10-CM

## 2016-11-10 DIAGNOSIS — M6281 Muscle weakness (generalized): Secondary | ICD-10-CM | POA: Diagnosis not present

## 2016-11-10 NOTE — Therapy (Signed)
[facility] [address] [place], [address] Phone: [phone]   Fax:  [phone]  Physical Therapy Treatment  Patient Details  Name: Whitney [name] MRN: [id number] Date of Birth: 1950/08/07 Referring Provider: Gurney Maxin  Encounter Date: 11/10/2016      PT End of Session - 11/10/16 1451    Visit Number 14   Number of Visits 41   Date for PT Re-Evaluation 01/15/17   Authorization Type g code 4/10   PT Start Time 3893   PT Stop Time 1530   PT Time Calculation (min) 45 min   Activity Tolerance Patient tolerated treatment well   Behavior During Therapy [facility] for tasks assessed/performed      Past Medical History:  Diagnosis Date  . Abnormal Pap smear of cervix    Pt states she had colposcopy   . Hypertension   . PMB (postmenopausal bleeding)     Past Surgical History:  Procedure Laterality Date  . KIDNEY SURGERY  1979    Vitals:   11/10/16 1525  BP: (!) 153/100  Pulse: 92  SpO2: 100%        Subjective Assessment - 11/10/16 1450    Subjective Pt reports she is doing well today and has no reported pain in her knee. She is performing HEP and states that she feels like she is getting stronger. No specific questions or concerns at this time.    Pertinent History leg weakness   How long can you sit comfortably? forever   How long can you stand comfortably? 5 minutes   How long can you walk comfortably? "not at all"   Diagnostic tests imaging - see scanned docs   Patient Stated Goals "I want to be able to walk a mile"   Currently in Pain? No/denies   Pain Onset More than a month ago              TREATMENT   Therapeutic Exercise NuStep L4/5 x 5 minutes during history, monitored intensity and modified as needed; Hooklying bridges 2 x 20 Sidelying hip abduction bilateral 2 x 15; Sidelying clams with manual resistance bilateral 2 x 15; Sit to stand without UE support from low mat  table 2 x 15; Total Gym squats L 24 2 x 15,  Total Gym single leg squats L10 x 10 RLE, x 15 LLE; Vitals obtained at end of session. BP is elevated so deferred high intensity intervals with UBE on this date;                      PT Education - 11/10/16 1451    Education provided Yes   Education Details HEP reinforced   Person(s) Educated Patient   Methods Explanation   Comprehension Verbalized understanding             PT Long Term Goals - 10/26/16 2014      PT LONG TERM GOAL #1   Title Pt will improve 10 MWS to .8 M/S to improve functional activity tolerance/gait   Baseline .2 M/S, 10/26/16: 0.83 m/s   Time 8   Period Weeks   Status Achieved     PT LONG TERM GOAL #2   Title Pt will be I with HEP to improve B hip strength to 5/5 to improve tolerance for gait   Baseline 3/5 R, 4/5 L; 10/23/16: hip flex: 4-/5 bil, hip abduction: 4-/5, hip adduction 4/5   Time 12  Period Weeks   Status On-going     PT LONG TERM GOAL #3   Title pt will be able to sit<>stand with single UE support for decr. balance   Baseline definite use of UE for support for this   Time 8   Period Weeks   Status Achieved     PT LONG TERM GOAL #4   Title Pt will improve 10 MWS to 1.0 M/S to improve functional activity tolerance/gait   Baseline 0.2 m/s, 10/23/16: 0.83 m/s   Time 12   Period Weeks   Status New     PT LONG TERM GOAL #5   Title Pt will decrease 5TSTS by at least 3 seconds in order to demonstrate clinically significant improvement in LE strength    Baseline 10/23/16: 16.43 s   Time 12   Period Weeks   Status New     Additional Long Term Goals   Additional Long Term Goals Yes     PT LONG TERM GOAL #6   Title Pt will increase 6MWT by at least 86m (123ft) in order to demonstrate clinically significant improvement in cardiopulmonary endurance and community ambulation   Baseline 10/23/16: 925'   Time 12   Status New               Plan - 11/10/16 1454     Clinical Impression Statement Pt makes good progress today. She is able to perform single leg squats on Total Gym without pain. Deferred instensity interval training due to elevated BP at end of session. Pt reports that she has not taken her BP meds today. Pt encouraged to monitor and follow-up as scheduled.    Rehab Potential Good   Clinical Impairments Affecting Rehab Potential Pro: motivation. .Con: chronicity of pain   PT Frequency 2x / week   PT Duration 12 weeks   PT Treatment/Interventions ADLs/Self Care Home Management;Aquatic Therapy;Therapeutic activities;Therapeutic exercise;Manual techniques;Gait training;Neuromuscular re-education;Balance training;DME Instruction;Patient/family education   PT Next Visit Plan Review HEP in greater details and update as appropriate, land-based strengthening program   PT Home Exercise Plan Sidelying hip abduction 2 x 10 qd, hooklying bridges 2 x 15 qd, sit to stand 2 x1 5 BID   Consulted and Agree with Plan of Care Patient      Patient will benefit from skilled therapeutic intervention in order to improve the following deficits and impairments:  Pain, Improper body mechanics, Postural dysfunction, Difficulty walking, Decreased balance, Decreased strength, Decreased range of motion, Decreased endurance, Hypomobility, Prosthetic Dependency, Obesity  Visit Diagnosis: Muscle weakness (generalized)  Difficulty in walking, not elsewhere classified     Problem List Patient Active Problem List   Diagnosis Date Noted  . Benign endometrial hyperplasia 07/22/2016  . Obesity (BMI 35.0-39.9 without comorbidity) 07/15/2016  . Thickened endometrium 07/15/2016  . Endometrial polyp 07/15/2016  . Postmenopausal bleeding 07/15/2016  . Class 3 obesity due to excess calories with body mass index (BMI) of 50.0 to 59.9 in adult ([place]) 07/15/2016  . Family history of breast cancer in first degree relative 07/15/2016  . Family history of ovarian cancer 07/15/2016    Phillips Grout PT, DPT   Whitney [name] 11/10/2016, 4:30 PM  [facility] [address] [place], [address] Phone: [phone]   Fax:  [phone]  Name: Whitney [name] MRN: [id number] Date of Birth: Feb 10, 1951

## 2016-11-12 ENCOUNTER — Ambulatory Visit: Payer: Medicare Other | Admitting: Physical Therapy

## 2016-11-13 ENCOUNTER — Ambulatory Visit: Payer: Medicare Other | Admitting: Physical Therapy

## 2016-11-13 DIAGNOSIS — M25551 Pain in right hip: Secondary | ICD-10-CM

## 2016-11-13 DIAGNOSIS — R262 Difficulty in walking, not elsewhere classified: Secondary | ICD-10-CM

## 2016-11-13 DIAGNOSIS — G8929 Other chronic pain: Secondary | ICD-10-CM

## 2016-11-13 DIAGNOSIS — M6281 Muscle weakness (generalized): Secondary | ICD-10-CM

## 2016-11-13 DIAGNOSIS — M5441 Lumbago with sciatica, right side: Secondary | ICD-10-CM

## 2016-11-13 NOTE — Therapy (Signed)
[place] [facility] [address] [place], [place], [address] Phone: [phone]   Fax:  [phone]  Physical Therapy Treatment  Patient Details  Name: Whitney [name] MRN: [id number] Date of Birth: 08-07-1950 Referring Provider: Gurney Maxin  Encounter Date: 11/13/2016      PT End of Session - 11/13/16 0853    Visit Number 15   Date for PT Re-Evaluation 01/15/17   Authorization Type g code 5/10   PT Start Time 0810   PT Stop Time 0850   PT Time Calculation (min) 40 min   Activity Tolerance Patient tolerated treatment well   Behavior During Therapy [facility] for tasks assessed/performed      Past Medical History:  Diagnosis Date  . Abnormal Pap smear of cervix    Pt states she had colposcopy   . Hypertension   . PMB (postmenopausal bleeding)     Past Surgical History:  Procedure Laterality Date  . KIDNEY SURGERY  1979    There were no vitals filed for this visit.      Subjective Assessment - 11/13/16 0849    Subjective Pt reports she needs to lost 8 more lbs to go through TKA.  Pt reported she had a back spasm the night after her last PTsession on land this week. Pt had new exercises.                      Adult Aquatic Therapy - 11/13/16 0908      Aquatic Therapy Subjective   Subjective Pt had no pain during session nor afterwards       O: Pt entered via steps /exited the pool via ramp with UE support on rail.  50 ft =1 lap  Exercises performed in 3'6" depth    Seated warm -up: LAQ, circles, hip abd/add 10'  2 laps grapevine with R LE leading forward, backward, BUE on noodles  2 laps grapevine with L LE leading forward, backward, BUE on noodles  2 laps walking backwards with multifidis twist , BUE on noodles  Stretches: forward forward by wall (center, left, right),  Trunk rotation   Relaxation by wall                     PT Long Term Goals - 10/26/16 2014      PT LONG  TERM GOAL #1   Title Pt will improve 10 MWS to .8 M/S to improve functional activity tolerance/gait   Baseline .2 M/S, 10/26/16: 0.83 m/s   Time 8   Period Weeks   Status Achieved     PT LONG TERM GOAL #2   Title Pt will be I with HEP to improve B hip strength to 5/5 to improve tolerance for gait   Baseline 3/5 R, 4/5 L; 10/23/16: hip flex: 4-/5 bil, hip abduction: 4-/5, hip adduction 4/5   Time 12   Period Weeks   Status On-going     PT LONG TERM GOAL #3   Title pt will be able to sit<>stand with single UE support for decr. balance   Baseline definite use of UE for support for this   Time 8   Period Weeks   Status Achieved     PT LONG TERM GOAL #4   Title Pt will improve 10 MWS to 1.0 M/S to improve functional activity tolerance/gait   Baseline 0.2 m/s, 10/23/16: 0.83 m/s   Time 12   Period Weeks   Status  New     PT LONG TERM GOAL #5   Title Pt will decrease 5TSTS by at least 3 seconds in order to demonstrate clinically significant improvement in LE strength    Baseline 10/23/16: 16.43 s   Time 12   Period Weeks   Status New     Additional Long Term Goals   Additional Long Term Goals Yes     PT LONG TERM GOAL #6   Title Pt will increase 6MWT by at least 25m (153ft) in order to demonstrate clinically significant improvement in cardiopulmonary endurance and community ambulation   Baseline 10/23/16: 925'   Time 12   Status New               Plan - 11/13/16 0854    Clinical Impression Statement Pt progressed to dynamic stability exercises in the pool today and did not show LOB. Pt required excessive cues for coordination but not for stability. Pt is continuing to benefit skilled PT.     Rehab Potential Good   Clinical Impairments Affecting Rehab Potential Pro: motivation. .Con: chronicity of pain   PT Frequency 2x / week   PT Duration 12 weeks   PT Treatment/Interventions ADLs/Self Care Home Management;Aquatic Therapy;Therapeutic activities;Therapeutic  exercise;Manual techniques;Gait training;Neuromuscular re-education;Balance training;DME Instruction;Patient/family education   PT Next Visit Plan Review HEP in greater details and update as appropriate, land-based strengthening program   PT Home Exercise Plan Sidelying hip abduction 2 x 10 qd, hooklying bridges 2 x 15 qd, sit to stand 2 x1 5 BID   Consulted and Agree with Plan of Care Patient      Patient will benefit from skilled therapeutic intervention in order to improve the following deficits and impairments:  Pain, Improper body mechanics, Postural dysfunction, Difficulty walking, Decreased balance, Decreased strength, Decreased range of motion, Decreased endurance, Hypomobility, Prosthetic Dependency, Obesity  Visit Diagnosis: Muscle weakness (generalized)  Difficulty in walking, not elsewhere classified  Chronic right-sided low back pain with right-sided sciatica  Pain in right hip     Problem List Patient Active Problem List   Diagnosis Date Noted  . Benign endometrial hyperplasia 07/22/2016  . Obesity (BMI 35.0-39.9 without comorbidity) 07/15/2016  . Thickened endometrium 07/15/2016  . Endometrial polyp 07/15/2016  . Postmenopausal bleeding 07/15/2016  . Class 3 obesity due to excess calories with body mass index (BMI) of 50.0 to 59.9 in adult ([place]) 07/15/2016  . Family history of breast cancer in first degree relative 07/15/2016  . Family history of ovarian cancer 07/15/2016    [name] ,PT, DPT, E-RYT  11/13/2016, 9:08 AM  [place] [facility] SERVICES [address] [place], [place], [address] Phone: [phone]   Fax:  [phone]  Name: Whitney [name] MRN: [id number] Date of Birth: [birth year]

## 2016-11-18 ENCOUNTER — Encounter: Payer: Medicare Other | Admitting: Physical Therapy

## 2016-11-18 ENCOUNTER — Telehealth: Payer: Self-pay | Admitting: Physical Therapy

## 2016-11-18 ENCOUNTER — Ambulatory Visit: Payer: Medicare Other | Admitting: Physical Therapy

## 2016-11-18 NOTE — Telephone Encounter (Signed)
PT called pt about pool appt today. Pt reported she was waiting for her bus. Pt also reported she is having gynecological issues and will be following up with a procedure in 3 weeks. PT advised pt to withhold from pool therapy and to resume land Tx. Pt voiced understanding. PT will call land PT to get pt scheduled for land appts instead of pool.

## 2016-11-19 ENCOUNTER — Ambulatory Visit: Payer: Medicare Other | Admitting: Physical Therapy

## 2016-11-25 ENCOUNTER — Ambulatory Visit: Payer: Medicare Other

## 2016-11-25 DIAGNOSIS — R262 Difficulty in walking, not elsewhere classified: Secondary | ICD-10-CM

## 2016-11-25 DIAGNOSIS — M6281 Muscle weakness (generalized): Secondary | ICD-10-CM | POA: Diagnosis not present

## 2016-11-25 NOTE — Therapy (Signed)
[place] [facility] [address], [place], [address] Phone: [phone]   Fax:  [phone]  Physical Therapy Treatment  Patient Details  Name: Whitney [name] MRN: [id number] Date of Birth: January 11, 1951 Referring Provider: Gurney Maxin  Encounter Date: 11/25/2016      PT End of Session - 11/25/16 1051    Visit Number 16   Number of Visits 41   Date for PT Re-Evaluation 01/15/17   Authorization Type g code 6/10   PT Start Time 1040   PT Stop Time 1115   PT Time Calculation (min) 35 min   Activity Tolerance Patient tolerated treatment well   Behavior During Therapy [facility] for tasks assessed/performed      Past Medical History:  Diagnosis Date  . Abnormal Pap smear of cervix    Pt states she had colposcopy   . Hypertension   . PMB (postmenopausal bleeding)     Past Surgical History:  Procedure Laterality Date  . KIDNEY SURGERY  1979    There were no vitals filed for this visit.      Subjective Assessment - 11/25/16 1043    Subjective Patient states she would like to walk for increased distances.    Pertinent History leg weakness   How long can you sit comfortably? forever   How long can you stand comfortably? 5 minutes   How long can you walk comfortably? "not at all"   Diagnostic tests imaging - see scanned docs   Patient Stated Goals "I want to be able to walk a mile"   Currently in Pain? Yes   Pain Score 6    Pain Location Knee   Pain Orientation Left   Pain Descriptors / Indicators Aching   Pain Type Chronic pain   Pain Onset More than a month ago          TREATMENT    Therapeutic Exercise NuStep L5 x 5 minutes during history, monitored intensity and modified as needed; Sit to stands with focus on speed - 2 x 15  SLR in sitting with focus on quadriceps control - x 15, x20  Hip abduction with RTB around knees - x 20  Mini Squats with RTB with UE support - x25 Heel/toe raises - x25  with UE support   Patient responds well to therapy and requires frequent sitting rest breaks throughout session.        PT Education - 11/25/16 1051    Education provided Yes   Education Details Form/technique with exercises   Person(s) Educated Patient   Methods Explanation;Demonstration   Comprehension Verbalized understanding;Returned demonstration             PT Long Term Goals - 10/26/16 2014      PT LONG TERM GOAL #1   Title Pt will improve 10 MWS to .8 M/S to improve functional activity tolerance/gait   Baseline .2 M/S, 10/26/16: 0.83 m/s   Time 8   Period Weeks   Status Achieved     PT LONG TERM GOAL #2   Title Pt will be I with HEP to improve B hip strength to 5/5 to improve tolerance for gait   Baseline 3/5 R, 4/5 L; 10/23/16: hip flex: 4-/5 bil, hip abduction: 4-/5, hip adduction 4/5   Time 12   Period Weeks   Status On-going     PT LONG TERM GOAL #3   Title pt will be able to sit<>stand with single UE support for decr.  balance   Baseline definite use of UE for support for this   Time 8   Period Weeks   Status Achieved     PT LONG TERM GOAL #4   Title Pt will improve 10 MWS to 1.0 M/S to improve functional activity tolerance/gait   Baseline 0.2 m/s, 10/23/16: 0.83 m/s   Time 12   Period Weeks   Status New     PT LONG TERM GOAL #5   Title Pt will decrease 5TSTS by at least 3 seconds in order to demonstrate clinically significant improvement in LE strength    Baseline 10/23/16: 16.43 s   Time 12   Period Weeks   Status New     Additional Long Term Goals   Additional Long Term Goals Yes     PT LONG TERM GOAL #6   Title Pt will increase 6MWT by at least 11m (170ft) in order to demonstrate clinically significant improvement in cardiopulmonary endurance and community ambulation   Baseline 10/23/16: 925'   Time 12   Status New               Plan - 11/25/16 1058    Clinical Impression Statement Patient demonstrates increased LE fatigue  throughout therapy session requiring frequent sitting rest breaks throughout session. Modified exercises to patients pain performing several exercises throughout shortened range of motion and patient will benefit from furhter skilled therapy focused on improving LE strength to return to prior level of function.    Rehab Potential Good   Clinical Impairments Affecting Rehab Potential Pro: motivation. .Con: chronicity of pain   PT Frequency 2x / week   PT Duration 12 weeks   PT Treatment/Interventions ADLs/Self Care Home Management;Aquatic Therapy;Therapeutic activities;Therapeutic exercise;Manual techniques;Gait training;Neuromuscular re-education;Balance training;DME Instruction;Patient/family education   PT Next Visit Plan Review HEP in greater details and update as appropriate, land-based strengthening program   PT Home Exercise Plan Sidelying hip abduction 2 x 10 qd, hooklying bridges 2 x 15 qd, sit to stand 2 x1 5 BID   Consulted and Agree with Plan of Care Patient      Patient will benefit from skilled therapeutic intervention in order to improve the following deficits and impairments:  Pain, Improper body mechanics, Postural dysfunction, Difficulty walking, Decreased balance, Decreased strength, Decreased range of motion, Decreased endurance, Hypomobility, Prosthetic Dependency, Obesity  Visit Diagnosis: Muscle weakness (generalized)  Difficulty in walking, not elsewhere classified     Problem List Patient Active Problem List   Diagnosis Date Noted  . Benign endometrial hyperplasia 07/22/2016  . Obesity (BMI 35.0-39.9 without comorbidity) 07/15/2016  . Thickened endometrium 07/15/2016  . Endometrial polyp 07/15/2016  . Postmenopausal bleeding 07/15/2016  . Class 3 obesity due to excess calories with body mass index (BMI) of 50.0 to 59.9 in adult ([place]) 07/15/2016  . Family history of breast cancer in first degree relative 07/15/2016  . Family history of ovarian cancer 07/15/2016     Blythe Stanford, PT DPT 11/25/2016, 11:17 AM  [place] [facility] [address], [place], [address] Phone: [phone]   Fax:  [phone]  Name: Whitney [name] MRN: [id number] Date of Birth: 27-Nov-1950

## 2016-11-27 ENCOUNTER — Ambulatory Visit: Payer: Medicare Other

## 2016-11-27 DIAGNOSIS — M6281 Muscle weakness (generalized): Secondary | ICD-10-CM | POA: Diagnosis not present

## 2016-11-27 DIAGNOSIS — M5441 Lumbago with sciatica, right side: Secondary | ICD-10-CM

## 2016-11-27 DIAGNOSIS — R262 Difficulty in walking, not elsewhere classified: Secondary | ICD-10-CM

## 2016-11-27 DIAGNOSIS — G8929 Other chronic pain: Secondary | ICD-10-CM

## 2016-11-27 NOTE — Therapy (Signed)
[place] [facility] [address], [place], [address] Phone: [phone]   Fax:  [phone]  Physical Therapy Treatment  Patient Details  Name: Whitney [name] MRN: [id number] Date of Birth: 1950-10-16 Referring Provider: Gurney Maxin  Encounter Date: 11/27/2016      PT End of Session - 11/27/16 1527    Visit Number 17   Number of Visits 41   Date for PT Re-Evaluation 01/15/17   Authorization Type g code 7/10   PT Start Time 1515   PT Stop Time 1600   PT Time Calculation (min) 45 min   Activity Tolerance Patient tolerated treatment well   Behavior During Therapy [facility] for tasks assessed/performed      Past Medical History:  Diagnosis Date  . Abnormal Pap smear of cervix    Pt states she had colposcopy   . Hypertension   . PMB (postmenopausal bleeding)     Past Surgical History:  Procedure Laterality Date  . KIDNEY SURGERY  1979    There were no vitals filed for this visit.      Subjective Assessment - 11/27/16 1520    Subjective Patient reports she was a little sore after the previous session but it has subsided. Patient reports she feels herlegs are geting stronger.    Pertinent History leg weakness   How long can you sit comfortably? forever   How long can you stand comfortably? 5 minutes   How long can you walk comfortably? "not at all"   Diagnostic tests imaging - see scanned docs   Patient Stated Goals "I want to be able to walk a mile"   Currently in Pain? No/denies   Pain Onset More than a month ago       TREATMENT    Therapeutic Exercise NuStep L5 x 3 minutes; L6 x 2 min during history, monitored intensity and modified as needed; Step ups B LE's - 2 x 10 with 12" step Hip Machine - hip abduction - 2 x 15 40# Mini Squats with RTB around knees with UE support - x25 Leg Press at [place] - x10 at 45, x 10 at 55, x10 at 65, x 10 75 # with cueing on increasing speed when pushing out [employer] in standing against single black tubing - forward/backward x73ft - stopped secondary to increased back pain CKC hip/lumbar extension with UE support - x20 Sit to stands with focus on speed - x 15     Patient responds well to therapy and requires frequent sitting rest breaks throughout session.      PT Education - 11/27/16 1521    Education provided Yes   Education Details form/technique with exercise   Person(s) Educated Patient   Methods Explanation;Demonstration   Comprehension Verbalized understanding;Returned demonstration             PT Long Term Goals - 10/26/16 2014      PT LONG TERM GOAL #1   Title Pt will improve 10 MWS to .8 M/S to improve functional activity tolerance/gait   Baseline .2 M/S, 10/26/16: 0.83 m/s   Time 8   Period Weeks   Status Achieved     PT LONG TERM GOAL #2   Title Pt will be I with HEP to improve B hip strength to 5/5 to improve tolerance for gait   Baseline 3/5 R, 4/5 L; 10/23/16: hip flex: 4-/5 bil, hip abduction: 4-/5, hip adduction 4/5   Time 12   Period  Weeks   Status On-going     PT LONG TERM GOAL #3   Title pt will be able to sit<>stand with single UE support for decr. balance   Baseline definite use of UE for support for this   Time 8   Period Weeks   Status Achieved     PT LONG TERM GOAL #4   Title Pt will improve 10 MWS to 1.0 M/S to improve functional activity tolerance/gait   Baseline 0.2 m/s, 10/23/16: 0.83 m/s   Time 12   Period Weeks   Status New     PT LONG TERM GOAL #5   Title Pt will decrease 5TSTS by at least 3 seconds in order to demonstrate clinically significant improvement in LE strength    Baseline 10/23/16: 16.43 s   Time 12   Period Weeks   Status New     Additional Long Term Goals   Additional Long Term Goals Yes     PT LONG TERM GOAL #6   Title Pt will increase 6MWT by at least 51m (168ft) in order to demonstrate clinically significant improvement in cardiopulmonary endurance and community  ambulation   Baseline 10/23/16: 925'   Time 12   Status New               Plan - 11/27/16 1529    Clinical Impression Statement Patient continues to demonstrate decreased LE strength but shows improvement over the past session indicating functional carryover between visits. Patient continues to require frequent sitting rest breaks througout the session but will benefit from further skilled therapy to return to prior level of function.    Rehab Potential Good   Clinical Impairments Affecting Rehab Potential Pro: motivation. .Con: chronicity of pain   PT Frequency 2x / week   PT Duration 12 weeks   PT Treatment/Interventions ADLs/Self Care Home Management;Aquatic Therapy;Therapeutic activities;Therapeutic exercise;Manual techniques;Gait training;Neuromuscular re-education;Balance training;DME Instruction;Patient/family education   PT Next Visit Plan Review HEP in greater details and update as appropriate, land-based strengthening program   PT Home Exercise Plan Sidelying hip abduction 2 x 10 qd, hooklying bridges 2 x 15 qd, sit to stand 2 x1 5 BID   Consulted and Agree with Plan of Care Patient      Patient will benefit from skilled therapeutic intervention in order to improve the following deficits and impairments:  Pain, Improper body mechanics, Postural dysfunction, Difficulty walking, Decreased balance, Decreased strength, Decreased range of motion, Decreased endurance, Hypomobility, Prosthetic Dependency, Obesity  Visit Diagnosis: Muscle weakness (generalized)  Difficulty in walking, not elsewhere classified  Chronic right-sided low back pain with right-sided sciatica     Problem List Patient Active Problem List   Diagnosis Date Noted  . Benign endometrial hyperplasia 07/22/2016  . Obesity (BMI 35.0-39.9 without comorbidity) 07/15/2016  . Thickened endometrium 07/15/2016  . Endometrial polyp 07/15/2016  . Postmenopausal bleeding 07/15/2016  . Class 3 obesity due to  excess calories with body mass index (BMI) of 50.0 to 59.9 in adult ([place]) 07/15/2016  . Family history of breast cancer in first degree relative 07/15/2016  . Family history of ovarian cancer 07/15/2016    Blythe Stanford, PT DPT 11/27/2016, 4:09 PM  [place] [facility] [address] [place], [address] Phone: [phone]   Fax:  [phone]  Name: Whitney [name] MRN: [id number] Date of Birth: 03-Mar-1951

## 2016-12-02 ENCOUNTER — Ambulatory Visit: Payer: Medicare Other | Admitting: Physical Therapy

## 2016-12-03 ENCOUNTER — Encounter: Payer: Medicare Other | Admitting: Physical Therapy

## 2016-12-04 ENCOUNTER — Ambulatory Visit: Payer: Medicare Other | Attending: Neurology | Admitting: Physical Therapy

## 2016-12-04 DIAGNOSIS — M25551 Pain in right hip: Secondary | ICD-10-CM | POA: Diagnosis present

## 2016-12-04 DIAGNOSIS — R262 Difficulty in walking, not elsewhere classified: Secondary | ICD-10-CM | POA: Insufficient documentation

## 2016-12-04 DIAGNOSIS — G8929 Other chronic pain: Secondary | ICD-10-CM | POA: Insufficient documentation

## 2016-12-04 DIAGNOSIS — R2689 Other abnormalities of gait and mobility: Secondary | ICD-10-CM | POA: Diagnosis not present

## 2016-12-04 DIAGNOSIS — M6281 Muscle weakness (generalized): Secondary | ICD-10-CM | POA: Diagnosis not present

## 2016-12-04 DIAGNOSIS — M5441 Lumbago with sciatica, right side: Secondary | ICD-10-CM | POA: Diagnosis present

## 2016-12-04 NOTE — Therapy (Signed)
[place] [facility] [address] [place], [place], [address] Phone: [phone]   Fax:  [phone]  Physical Therapy Treatment  Patient Details  Name: Whitney [name] MRN: [id number] Date of Birth: May 24, 1951 Referring Provider: Gurney Maxin  Encounter Date: 12/04/2016      PT End of Session - 12/04/16 1045    Visit Number 18   Number of Visits 41   Date for PT Re-Evaluation 01/15/17   Authorization Type g code 8/10   PT Start Time 0900   PT Stop Time 0945   PT Time Calculation (min) 45 min   Activity Tolerance Patient tolerated treatment well   Behavior During Therapy [facility] for tasks assessed/performed      Past Medical History:  Diagnosis Date  . Abnormal Pap smear of cervix    Pt states she had colposcopy   . Hypertension   . PMB (postmenopausal bleeding)     Past Surgical History:  Procedure Laterality Date  . KIDNEY SURGERY  1979    There were no vitals filed for this visit.      Subjective Assessment - 12/04/16 1044    Subjective Pt reported she did not sleep well last night and feels tired. Pt had a shot in her knee this past Monday and was told to not do anything strenuous for 3 days. Pt had a good PT session on land last week and enjoyed how hard she worked with her PT.  Her surgery is scheduled in Sept. She feels her R knee is sore from her shot.     Pertinent History leg weakness   How long can you sit comfortably? forever   How long can you stand comfortably? 5 minutes   How long can you walk comfortably? "not at all"   Diagnostic tests imaging - see scanned docs   Patient Stated Goals "I want to be able to walk a mile"   Pain Onset More than a month ago                     Adult Aquatic Therapy - 12/04/16 1045      Aquatic Therapy Subjective   Subjective Pt had no pain during session nor afterwards        O: Pt entered the pool via steps with single UE support on rail  And exited  the ramp with BUE on rail.   50 ft =1 lap  Exercises performed in 3'6" depth   Seated on bench:  Leg circles, hip abduction/add, ankle circles, DF  Chest rows with noodle with cue for shoulder depression 10 reps Trunk rotation  10 reps each side   Noodles under armpits 1 lap forward walking 1 lap backward walking  2 laps sidestepping L/R   Seated on bench:  Leg circles, hip abduction/add, ankle circles, DF   Rest period                   PT Long Term Goals - 10/26/16 2014      PT LONG TERM GOAL #1   Title Pt will improve 10 MWS to .8 M/S to improve functional activity tolerance/gait   Baseline .2 M/S, 10/26/16: 0.83 m/s   Time 8   Period Weeks   Status Achieved     PT LONG TERM GOAL #2   Title Pt will be I with HEP to improve B hip strength to 5/5 to improve tolerance for gait   Baseline 3/5 R,  4/5 L; 10/23/16: hip flex: 4-/5 bil, hip abduction: 4-/5, hip adduction 4/5   Time 12   Period Weeks   Status On-going     PT LONG TERM GOAL #3   Title pt will be able to sit<>stand with single UE support for decr. balance   Baseline definite use of UE for support for this   Time 8   Period Weeks   Status Achieved     PT LONG TERM GOAL #4   Title Pt will improve 10 MWS to 1.0 M/S to improve functional activity tolerance/gait   Baseline 0.2 m/s, 10/23/16: 0.83 m/s   Time 12   Period Weeks   Status New     PT LONG TERM GOAL #5   Title Pt will decrease 5TSTS by at least 3 seconds in order to demonstrate clinically significant improvement in LE strength    Baseline 10/23/16: 16.43 s   Time 12   Period Weeks   Status New     Additional Long Term Goals   Additional Long Term Goals Yes     PT LONG TERM GOAL #6   Title Pt will increase 6MWT by at least 67m (121ft) in order to demonstrate clinically significant improvement in cardiopulmonary endurance and community ambulation   Baseline 10/23/16: 925'   Time 12   Status New               Plan -  12/04/16 1046    Clinical Impression Statement Pt tolerated session today with report of 60% relief in the soreness of her R knee. three quarter of the session was performed seated to downgrade her session due to the soreness she felt from her shot and not getting enough sleep last night. Pt performed gait training with report that side stepping and backwards walking had less soreness on her knee compared to forward walking. Pt required cues to not hold her breath with sit to stand and to use arm rests to push off instead of pulling on the support infront of her (pool rail) to rise. Pt demo'd properly after training. Pt continues to progress well towards her goals with skilled PT.    Rehab Potential Good   Clinical Impairments Affecting Rehab Potential Pro: motivation. .Con: chronicity of pain   PT Frequency 2x / week   PT Duration 12 weeks   PT Treatment/Interventions ADLs/Self Care Home Management;Aquatic Therapy;Therapeutic activities;Therapeutic exercise;Manual techniques;Gait training;Neuromuscular re-education;Balance training;DME Instruction;Patient/family education   PT Next Visit Plan Review HEP in greater details and update as appropriate, land-based strengthening program   PT Home Exercise Plan Sidelying hip abduction 2 x 10 qd, hooklying bridges 2 x 15 qd, sit to stand 2 x1 5 BID   Consulted and Agree with Plan of Care Patient      Patient will benefit from skilled therapeutic intervention in order to improve the following deficits and impairments:  Pain, Improper body mechanics, Postural dysfunction, Difficulty walking, Decreased balance, Decreased strength, Decreased range of motion, Decreased endurance, Hypomobility, Prosthetic Dependency, Obesity  Visit Diagnosis: Muscle weakness (generalized)  Difficulty in walking, not elsewhere classified  Chronic right-sided low back pain with right-sided sciatica  Pain in right hip     Problem List Patient Active Problem List    Diagnosis Date Noted  . Benign endometrial hyperplasia 07/22/2016  . Obesity (BMI 35.0-39.9 without comorbidity) 07/15/2016  . Thickened endometrium 07/15/2016  . Endometrial polyp 07/15/2016  . Postmenopausal bleeding 07/15/2016  . Class 3 obesity due to excess calories with body  mass index (BMI) of 50.0 to 59.9 in adult [facility]) 07/15/2016  . Family history of breast cancer in first degree relative 07/15/2016  . Family history of ovarian cancer 07/15/2016    [name] ,PT, DPT, E-RYT  12/04/2016, 10:51 AM  [place] [facility] [address] [place], [place], [address] Phone: [phone]   Fax:  [phone]  Name: Whitney [name] MRN: [id number] Date of Birth: 1950-09-26

## 2016-12-09 ENCOUNTER — Ambulatory Visit: Payer: Medicare Other

## 2016-12-09 DIAGNOSIS — M6281 Muscle weakness (generalized): Secondary | ICD-10-CM

## 2016-12-09 DIAGNOSIS — R262 Difficulty in walking, not elsewhere classified: Secondary | ICD-10-CM

## 2016-12-09 NOTE — Therapy (Signed)
[facility] [address] [place], [address] Phone: [phone]   Fax:  [phone]  Physical Therapy Treatment  Patient Details  Name: Whitney [name] MRN: [id number] Date of Birth: 1951-05-17 Referring Provider: Gurney Maxin  Encounter Date: 12/09/2016      PT End of Session - 12/09/16 1344    Visit Number 19   Number of Visits 41   Date for PT Re-Evaluation 01/15/17   Authorization Type g code 9/10   PT Start Time 1330   PT Stop Time 1400   PT Time Calculation (min) 30 min   Activity Tolerance Patient tolerated treatment well   Behavior During Therapy [facility] for tasks assessed/performed      Past Medical History:  Diagnosis Date  . Abnormal Pap smear of cervix    Pt states she had colposcopy   . Hypertension   . PMB (postmenopausal bleeding)     Past Surgical History:  Procedure Laterality Date  . KIDNEY SURGERY  1979    There were no vitals filed for this visit.      Subjective Assessment - 12/09/16 1333    Subjective Patient reports she experienced a fall the previous night after performing a sit to stand. Patient reports she just had new flooring installed in her home. Patient denies LOC or hitting her head.    Pertinent History leg weakness   How long can you sit comfortably? forever   How long can you stand comfortably? 5 minutes   How long can you walk comfortably? "not at all"   Diagnostic tests imaging - see scanned docs   Patient Stated Goals "I want to be able to walk a mile"   Currently in Pain? Yes   Pain Score 8    Pain Location Knee   Pain Orientation Right;Left   Pain Descriptors / Indicators Aching   Pain Type Acute pain   Pain Onset More than a month ago       TREATMENT: Observation: Increased pain in bilateral knees: On the right side increased medial knee pain with TTP to light pressure over the anterior medial tibial tubercle with increased muscle guarding to the  quadriceps musculature. On the left side: increased TTP along the Tibial tuberosity and petallar tendon with increased guarding to the quadriceps on that affected side. Therapeutic Exercise: Knees to chest on physioball - performed x10 and stopped secondary to increased pain. Therapist assisted knee flexion extension performed bilaterally - 2 x 15min performed on each side to decrease pain and spasms Modalities: High volt: with patient positioned in supine with 2 large electrodes placed over bilateral knees to decrease pain and spasms. High volt placed for 25 min with moist heat placed over bilateral knees.  Patient demonstrates decreased pain and spasms after electrical stimulation and modalities.        PT Education - 12/09/16 1342    Education provided Yes   Education Details Educated on importance of monitoring symptoms and techniques to decrease pain and spasms   Person(s) Educated Patient   Methods Explanation   Comprehension Verbalized understanding             PT Long Term Goals - 10/26/16 2014      PT LONG TERM GOAL #1   Title Pt will improve 10 MWS to .8 M/S to improve functional activity tolerance/gait   Baseline .2 M/S, 10/26/16: 0.83 m/s   Time 8   Period Weeks   Status Achieved  PT LONG TERM GOAL #2   Title Pt will be I with HEP to improve B hip strength to 5/5 to improve tolerance for gait   Baseline 3/5 R, 4/5 L; 10/23/16: hip flex: 4-/5 bil, hip abduction: 4-/5, hip adduction 4/5   Time 12   Period Weeks   Status On-going     PT LONG TERM GOAL #3   Title pt will be able to sit<>stand with single UE support for decr. balance   Baseline definite use of UE for support for this   Time 8   Period Weeks   Status Achieved     PT LONG TERM GOAL #4   Title Pt will improve 10 MWS to 1.0 M/S to improve functional activity tolerance/gait   Baseline 0.2 m/s, 10/23/16: 0.83 m/s   Time 12   Period Weeks   Status New     PT LONG TERM GOAL #5   Title Pt will  decrease 5TSTS by at least 3 seconds in order to demonstrate clinically significant improvement in LE strength    Baseline 10/23/16: 16.43 s   Time 12   Period Weeks   Status New     Additional Long Term Goals   Additional Long Term Goals Yes     PT LONG TERM GOAL #6   Title Pt will increase 6MWT by at least 39m (177ft) in order to demonstrate clinically significant improvement in cardiopulmonary endurance and community ambulation   Baseline 10/23/16: 925'   Time 12   Status New               Plan - 12/09/16 1422    Clinical Impression Statement Patient demonstrates decreased pain after performing modalities treatment indicating decreased pain and spasms. Educated patient on monitoring symptoms when at home and to contact physician if symptoms do not improve. Patient will benefit from further skilled therapy focused on improving functional capacity to return to prior level of function.     Rehab Potential Good   Clinical Impairments Affecting Rehab Potential Pro: motivation. .Con: chronicity of pain   PT Frequency 2x / week   PT Duration 12 weeks   PT Treatment/Interventions ADLs/Self Care Home Management;Aquatic Therapy;Therapeutic activities;Therapeutic exercise;Manual techniques;Gait training;Neuromuscular re-education;Balance training;DME Instruction;Patient/family education   PT Next Visit Plan Review HEP in greater details and update as appropriate, land-based strengthening program   PT Home Exercise Plan Sidelying hip abduction 2 x 10 qd, hooklying bridges 2 x 15 qd, sit to stand 2 x1 5 BID   Consulted and Agree with Plan of Care Patient      Patient will benefit from skilled therapeutic intervention in order to improve the following deficits and impairments:  Pain, Improper body mechanics, Postural dysfunction, Difficulty walking, Decreased balance, Decreased strength, Decreased range of motion, Decreased endurance, Hypomobility, Prosthetic Dependency, Obesity, Impaired  flexibility  Visit Diagnosis: Difficulty in walking, not elsewhere classified  Muscle weakness (generalized)     Problem List Patient Active Problem List   Diagnosis Date Noted  . Benign endometrial hyperplasia 07/22/2016  . Obesity (BMI 35.0-39.9 without comorbidity) 07/15/2016  . Thickened endometrium 07/15/2016  . Endometrial polyp 07/15/2016  . Postmenopausal bleeding 07/15/2016  . Class 3 obesity due to excess calories with body mass index (BMI) of 50.0 to 59.9 in adult ([place]) 07/15/2016  . Family history of breast cancer in first degree relative 07/15/2016  . Family history of ovarian cancer 07/15/2016    Blythe Stanford, PT DPT 12/09/2016, 2:34 PM  [facility] [address], [place], [address] Phone: [phone]   Fax:  [phone]  Name: Whitney [name] MRN: [id number] Date of Birth: 01/30/1951

## 2016-12-11 ENCOUNTER — Ambulatory Visit: Payer: Medicare Other | Admitting: Physical Therapy

## 2016-12-16 ENCOUNTER — Ambulatory Visit: Payer: Medicare Other

## 2016-12-18 ENCOUNTER — Telehealth: Payer: Self-pay | Admitting: Physical Therapy

## 2016-12-18 ENCOUNTER — Ambulatory Visit: Payer: Medicare Other | Admitting: Physical Therapy

## 2016-12-18 DIAGNOSIS — G8929 Other chronic pain: Secondary | ICD-10-CM

## 2016-12-18 DIAGNOSIS — R262 Difficulty in walking, not elsewhere classified: Secondary | ICD-10-CM

## 2016-12-18 DIAGNOSIS — M25551 Pain in right hip: Secondary | ICD-10-CM

## 2016-12-18 DIAGNOSIS — M6281 Muscle weakness (generalized): Secondary | ICD-10-CM | POA: Diagnosis not present

## 2016-12-18 DIAGNOSIS — M5441 Lumbago with sciatica, right side: Secondary | ICD-10-CM

## 2016-12-18 NOTE — Therapy (Signed)
[place] MAIN [facility] SERVICES [address] [place], [place], [address] Phone: [phone]   Fax:  [phone]  Physical Therapy Treatment  Patient Details  Name: Whitney [name] MRN: [id number] Date of Birth: [birth year] Referring Provider: Gurney Maxin  Encounter Date: 12/18/2016      PT End of Session - 12/18/16 1821    Visit Number 20   Number of Visits 41   Date for PT Re-Evaluation 01/15/17   Authorization Type g code 10/10   PT Start Time 0900   PT Stop Time 0935   PT Time Calculation (min) 35 min   Activity Tolerance Patient tolerated treatment well   Behavior During Therapy [facility] for tasks assessed/performed      Past Medical History:  Diagnosis Date  . Abnormal Pap smear of cervix    Pt states she had colposcopy   . Hypertension   . PMB (postmenopausal bleeding)     Past Surgical History:  Procedure Laterality Date  . KIDNEY SURGERY  1979    There were no vitals filed for this visit.      Subjective Assessment - 12/18/16 1059    Subjective Pt reported she saw the MD this week and they noticed her BP was high (~210 ) and they told her to withhold on PT that afternoon. Pt was ordered a BP cuff to continue monitoring her BP and it would arrivein the mail. Pt was prescribed a third BP medication. Pt stated she thinks she contributes the high BP to having eating pork and bacon over the weekend across 3 days when she went to the beach to celebrate Mother's Day. Pt states she has a slight HA but did not have dizziness, blurred vision, nor other Sx.     Pertinent History leg weakness   How long can you sit comfortably? forever   How long can you stand comfortably? 5 minutes   How long can you walk comfortably? "not at all"   Diagnostic tests imaging - see scanned docs   Patient Stated Goals "I want to be able to walk a mile"   Pain Onset More than a month ago          O: Pt entered the pool via steps with single UE  support on rail  And exited the ramp with BUE on rail.   50 ft =1 lap  Exercises performed in 3'6" depth   Seated on bench:  Leg circles, hip abduction/add, ankle circles, DF  Chest rows with noodle with cue for shoulder depression 10 reps Trunk rotation  10 reps each side   Noodle (x1)  under armpits 1 lap forward walking x 2 1 lap backward walking x 2  2 laps sidestepping L/R in 4'0' depth with BUE on rope   Rest period out of the water 15' BP taken Rest period in locker room 15' BP taken       Noted bruising on L arm and L shin where pt reported having a fall previous week. She informed her land PT of her fall at her last visit with him.                              PT Long Term Goals - 10/26/16 2014      PT LONG TERM GOAL #1   Title Pt will improve 10 MWS to .8 M/S to improve functional activity tolerance/gait   Baseline .2 M/S, 10/26/16:  0.83 m/s   Time 8   Period Weeks   Status Achieved     PT LONG TERM GOAL #2   Title Pt will be I with HEP to improve B hip strength to 5/5 to improve tolerance for gait   Baseline 3/5 R, 4/5 L; 10/23/16: hip flex: 4-/5 bil, hip abduction: 4-/5, hip adduction 4/5   Time 12   Period Weeks   Status On-going     PT LONG TERM GOAL #3   Title pt will be able to sit<>stand with single UE support for decr. balance   Baseline definite use of UE for support for this   Time 8   Period Weeks   Status Achieved     PT LONG TERM GOAL #4   Title Pt will improve 10 MWS to 1.0 M/S to improve functional activity tolerance/gait   Baseline 0.2 m/s, 10/23/16: 0.83 m/s   Time 12   Period Weeks   Status New     PT LONG TERM GOAL #5   Title Pt will decrease 5TSTS by at least 3 seconds in order to demonstrate clinically significant improvement in LE strength    Baseline 10/23/16: 16.43 s   Time 12   Period Weeks   Status New     Additional Long Term Goals   Additional Long Term Goals Yes     PT LONG TERM GOAL #6    Title Pt will increase 6MWT by at least 13m (163ft) in order to demonstrate clinically significant improvement in cardiopulmonary endurance and community ambulation   Baseline 10/23/16: 925'   Time 12   Status New               Plan - 12/18/16 1822    Clinical Impression Statement Pt tolerated session with less floatation device for stability and progressed to deeper water for 2 laps without lose of balance. Midway through the session, pt  reported that she had elevated BP that occured when she was at her MD appt earlier this week and was told to withhold PT that was scheduled on that same day in the afternoon. She was prescribed a third medication which she stated she was going to pick after PT today.  Pt stated she had a slight HA this morning but denied red flag signs ( LOB, blurry vision, slurred speech).  2 BP readings were taken: 177/110 mm Hg, 177/100 mm Hg across ~30 min once she exited the pool IND with PT close by for monitoring.  Pt reported slight light headedness after the first reading and reported nervousness. After education about paced breathing, pt reported her lightedheadedness decreased.  PT phoned her PCP 's office at [facility] but was unable to reach a Therapist, sports. PT left a message re: pt's elevated BP readings and pt's f/u on retreiving her medication from the pharmacy. Pt voiced understanding the signs of stroke, when to seek immediate medical attention, and when to f/u with her MD as she continues to monitor her BP.   Pt continues to benefit from skilled PT.    Rehab Potential Good   Clinical Impairments Affecting Rehab Potential Pro: motivation. .Con: chronicity of pain   PT Frequency 2x / week   PT Duration 12 weeks   PT Treatment/Interventions ADLs/Self Care Home Management;Aquatic Therapy;Therapeutic activities;Therapeutic exercise;Manual techniques;Gait training;Neuromuscular re-education;Balance training;DME Instruction;Patient/family education   PT Next Visit Plan Review HEP  in greater details and update as appropriate, land-based strengthening program   PT Home Exercise Plan Sidelying hip  abduction 2 x 10 qd, hooklying bridges 2 x 15 qd, sit to stand 2 x1 5 BID   Consulted and Agree with Plan of Care Patient      Patient will benefit from skilled therapeutic intervention in order to improve the following deficits and impairments:  Pain, Improper body mechanics, Postural dysfunction, Difficulty walking, Decreased balance, Decreased strength, Decreased range of motion, Decreased endurance, Hypomobility, Prosthetic Dependency, Obesity, Impaired flexibility  Visit Diagnosis: Muscle weakness (generalized)  Difficulty in walking, not elsewhere classified  Chronic right-sided low back pain with right-sided sciatica  Pain in right hip       G-Codes - 01/02/2017 1825    Functional Assessment Tool Used (Outpatient Only) clinical judgment   Functional Limitation Mobility: Walking and moving around   Mobility: Walking and Moving Around Current Status [phone]) At least 40 percent but less than 60 percent impaired, limited or restricted   Mobility: Walking and Moving Around Goal Status [phone]) At least 20 percent but less than 40 percent impaired, limited or restricted      Problem List Patient Active Problem List   Diagnosis Date Noted  . Benign endometrial hyperplasia 07/22/2016  . Obesity (BMI 35.0-39.9 without comorbidity) 07/15/2016  . Thickened endometrium 07/15/2016  . Endometrial polyp 07/15/2016  . Postmenopausal bleeding 07/15/2016  . Class 3 obesity due to excess calories with body mass index (BMI) of 50.0 to 59.9 in adult ([place]) 07/15/2016  . Family history of breast cancer in first degree relative 07/15/2016  . Family history of ovarian cancer 07/15/2016    [name] January 02, 2017, 6:36 PM  [place] MAIN [facility] SERVICES [address] [place], [place], [address] Phone: [phone]   Fax:   [phone]  Name: Whitney [name] MRN: [id number] Date of Birth: [birth year]

## 2016-12-18 NOTE — Telephone Encounter (Signed)
PT phone pt's PCP office at [facility] around 10am and left a message on the RN line re: pt's elevated BP taken after PT.  177/12 mm Hg, 177 mm Hg across ~30 min with seated rest. Pt stated she took 2 of 3 BP meds this morning and was planning on picking up her 3rd medication from the pharmacy after PT. Pt denied dizziness, nausea. Pt showed no LOB, slurred speech, nor weakness. Pt reported light headed after the high BP reading but the light headed went away. Pt stated she has a light headache this morning.  PT provided SBA for pt in the locker room and ensured pt was safe and seated when showered. After she got dressed, pt was provided water and crackers. Pt was left in the lobby area waiting for her public transportation, seated on a bench. Pt reported her slight HA and was feeling fine. Pt remained cognitive and coherent. Pt voiced understanding to pick up her medication after PT and will seek immediate medical attention if her HA persists or have worsening of Sx.

## 2016-12-22 ENCOUNTER — Telehealth: Payer: Self-pay | Admitting: Physical Therapy

## 2016-12-22 NOTE — Telephone Encounter (Signed)
PT phoned pt today to check up on pt re: her elevated BP. Pt stated she has been taking her third BP medication the past few days and no longer has a headache. Pt has checked her BP since her last PT appt on 12/18/16 and it has been 120/80 mm Hg. Pt plans to come to her next PT appt.

## 2016-12-25 ENCOUNTER — Ambulatory Visit: Payer: Medicare Other | Admitting: Physical Therapy

## 2016-12-25 VITALS — BP 132/80

## 2016-12-25 DIAGNOSIS — M25551 Pain in right hip: Secondary | ICD-10-CM

## 2016-12-25 DIAGNOSIS — G8929 Other chronic pain: Secondary | ICD-10-CM

## 2016-12-25 DIAGNOSIS — M6281 Muscle weakness (generalized): Secondary | ICD-10-CM

## 2016-12-25 DIAGNOSIS — R262 Difficulty in walking, not elsewhere classified: Secondary | ICD-10-CM

## 2016-12-25 DIAGNOSIS — M5441 Lumbago with sciatica, right side: Secondary | ICD-10-CM

## 2016-12-26 NOTE — Therapy (Addendum)
[place] MAIN [facility] SERVICES [address] [place], [place], [address] Phone: [phone]   Fax:  [phone]  Physical Therapy Treatment  Patient Details  Name: Whitney [name] MRN: [id number] Date of Birth: 12/29/1950 Referring Provider: Gurney Maxin  Encounter Date: 12/25/2016      PT End of Session - 12/25/16 1309    Visit Number 21   Number of Visits 41   Date for PT Re-Evaluation 01/15/17   Authorization Type g code 1/10   PT Start Time 1000   PT Stop Time 1045   PT Time Calculation (min) 45 min   Activity Tolerance Patient tolerated treatment well   Behavior During Therapy [facility] for tasks assessed/performed      Past Medical History:  Diagnosis Date  . Abnormal Pap smear of cervix    Pt states she had colposcopy   . Hypertension   . PMB (postmenopausal bleeding)     Past Surgical History:  Procedure Laterality Date  . KIDNEY SURGERY  1979    Vitals:   12/25/16 1000  BP: 132/80        Subjective Assessment - 12/25/16 1010    Subjective Pt reported she has been taking all three of her blood pressure medications.    Pertinent History leg weakness   How long can you sit comfortably? forever   How long can you stand comfortably? 5 minutes   How long can you walk comfortably? "not at all"   Diagnostic tests imaging - see scanned docs   Patient Stated Goals "I want to be able to walk a mile"   Pain Onset More than a month ago      Aquatic Tx: Pt reported slight dizziness when walking from 4'0" depth to pool bench in 3'0" but dizziness dissipated quickly once pt sat down for her seated rest period.    O: Pt entered/exited the pool via steps with single UE support on rail.  50 ft =1 lap  Exercises performed in 3'6" depth   Stretches:  2 laps walking no noodles  weighted waist belt donned at waist  4 laps walking backwards along pool lines with no noodles 4 laps sidestepping L and R, cued for feet  proprioception ( wider BOS)    At 4'0" depth  30 sec fast marching x 30 sec rest 3 sets    Exercises performed in 4'6" depth  2 laps forward walking   Seated rest break 10'                                PT Long Term Goals - 10/26/16 2014      PT LONG TERM GOAL #1   Title Pt will improve 10 MWS to .8 M/S to improve functional activity tolerance/gait   Baseline .2 M/S, 10/26/16: 0.83 m/s   Time 8   Period Weeks   Status Achieved     PT LONG TERM GOAL #2   Title Pt will be I with HEP to improve B hip strength to 5/5 to improve tolerance for gait   Baseline 3/5 R, 4/5 L; 10/23/16: hip flex: 4-/5 bil, hip abduction: 4-/5, hip adduction 4/5   Time 12   Period Weeks   Status On-going     PT LONG TERM GOAL #3   Title pt will be able to sit<>stand with single UE support for decr. balance   Baseline definite use of UE for  support for this   Time 8   Period Weeks   Status Achieved     PT LONG TERM GOAL #4   Title Pt will improve 10 MWS to 1.0 M/S to improve functional activity tolerance/gait   Baseline 0.2 m/s, 10/23/16: 0.83 m/s   Time 12   Period Weeks   Status New     PT LONG TERM GOAL #5   Title Pt will decrease 5TSTS by at least 3 seconds in order to demonstrate clinically significant improvement in LE strength    Baseline 10/23/16: 16.43 s   Time 12   Period Weeks   Status New     Additional Long Term Goals   Additional Long Term Goals Yes     PT LONG TERM GOAL #6   Title Pt will increase 6MWT by at least 91m (144ft) in order to demonstrate clinically significant improvement in cardiopulmonary endurance and community ambulation   Baseline 10/23/16: 925'   Time 12   Status New               Plan - 12/26/16 1314    Clinical Impression Statement Pt tolerated progressions of exercises today  with use of weighted waist belt to provide a postural stability challenge. Pt did not require BUE support on floatation devices today which demo'd  improved balance. However,  pt required excessive feet propioception cues. Initiated speed and power into exercises.  PT will continue to progress  these elements of strength training gradually and with precaution due to pt's previously elevated BP which has been normalized with her compliance to new medications prior to coming to PT.  Plan to continue monitoring BP pre and post exercise.   Pt began PT with normal BP 132/80- mm Hg. BP was not able to be taken after Tx due to malfunction of cuff. Pt reported dizziness briefly when moving from 4'0" depth of water to 3'6" depth at the end of her session. Pt reported no dizziness following a seated rest breakand was able to exit the pool IND and presented with no red flag signs.    Pt continues to benefit from skilled PT.    Rehab Potential Good   Clinical Impairments Affecting Rehab Potential Pro: motivation. .Con: chronicity of pain   PT Frequency 2x / week   PT Duration 12 weeks   PT Treatment/Interventions ADLs/Self Care Home Management;Aquatic Therapy;Therapeutic activities;Therapeutic exercise;Manual techniques;Gait training;Neuromuscular re-education;Balance training;DME Instruction;Patient/family education   PT Next Visit Plan Review HEP in greater details and update as appropriate, land-based strengthening program   PT Home Exercise Plan Sidelying hip abduction 2 x 10 qd, hooklying bridges 2 x 15 qd, sit to stand 2 x1 5 BID   Consulted and Agree with Plan of Care Patient      Patient will benefit from skilled therapeutic intervention in order to improve the following deficits and impairments:  Pain, Improper body mechanics, Postural dysfunction, Difficulty walking, Decreased balance, Decreased strength, Decreased range of motion, Decreased endurance, Hypomobility, Prosthetic Dependency, Obesity, Impaired flexibility  Visit Diagnosis: Difficulty in walking, not elsewhere classified  Muscle weakness (generalized)  Chronic right-sided low  back pain with right-sided sciatica  Pain in right hip     Problem List Patient Active Problem List   Diagnosis Date Noted  . Benign endometrial hyperplasia 07/22/2016  . Obesity (BMI 35.0-39.9 without comorbidity) 07/15/2016  . Thickened endometrium 07/15/2016  . Endometrial polyp 07/15/2016  . Postmenopausal bleeding 07/15/2016  . Class 3 obesity due to excess calories with  body mass index (BMI) of 50.0 to 59.9 in adult [facility]) 07/15/2016  . Family history of breast cancer in first degree relative 07/15/2016  . Family history of ovarian cancer 07/15/2016    [name] ,PT, DPT, E-RYT   12/26/2016, 1:17 PM  [place] MAIN [facility] SERVICES [address] [place], [place], [address] Phone: [phone]   Fax:  [phone]  Name: Whitney [name] MRN: [id number] Date of Birth: [birth year]

## 2016-12-30 ENCOUNTER — Ambulatory Visit: Payer: Medicare Other | Admitting: Physical Therapy

## 2016-12-30 VITALS — BP 130/80

## 2016-12-30 DIAGNOSIS — G8929 Other chronic pain: Secondary | ICD-10-CM

## 2016-12-30 DIAGNOSIS — M6281 Muscle weakness (generalized): Secondary | ICD-10-CM

## 2016-12-30 DIAGNOSIS — M5441 Lumbago with sciatica, right side: Secondary | ICD-10-CM

## 2016-12-30 DIAGNOSIS — R262 Difficulty in walking, not elsewhere classified: Secondary | ICD-10-CM

## 2016-12-30 DIAGNOSIS — M25551 Pain in right hip: Secondary | ICD-10-CM

## 2016-12-30 NOTE — Therapy (Signed)
[place] MAIN [facility] SERVICES [address] [place], [place], [address] Phone: [phone]   Fax:  [phone]  Physical Therapy Treatment  Patient Details  Name: Whitney [name] MRN: [id number] Date of Birth: June 11, 1951 Referring Provider: Gurney Maxin  Encounter Date: 12/30/2016      PT End of Session - 12/30/16 1132    Visit Number 22   Number of Visits 41   Date for PT Re-Evaluation 01/15/17   Authorization Type g code 2/10   PT Start Time 1030   PT Stop Time 1115   PT Time Calculation (min) 45 min   Activity Tolerance Patient tolerated treatment well   Behavior During Therapy [facility] for tasks assessed/performed      Past Medical History:  Diagnosis Date  . Abnormal Pap smear of cervix    Pt states she had colposcopy   . Hypertension   . PMB (postmenopausal bleeding)     Past Surgical History:  Procedure Laterality Date  . KIDNEY SURGERY  1979    Vitals:   12/30/16 1030 12/30/16 1120  BP: 140/80 130/80        Subjective Assessment - 12/30/16 1131    Subjective Pt stated she is feeling good today. She continues to take her BP medications and performs her exercises at home                     Adult Aquatic Therapy - 12/30/16 1131      Aquatic Therapy Subjective   Subjective Pt had no pain during session nor afterwards  Reported no dizzines, lightedheadness            Aquatic Tx:    O: Pt entered/exited the pool via steps with single UE support on rail.  50 ft =1 lap  Exercises performed in 3'6" depth    weighted waist belt donned at waist  2 laps walking forward , cued for propioception along pool ine 4 laps walking backwards along pool lines with no noodles 30 sec fast marching x 30 sec rest 3 sets  2' min fast marching. PT cued pt to rest seated at the bench and explained the principle of HIIT and the importance to rest despite pt reporting she did not feel tired.   At 4'0" depth   4 laps sidestepping L and R, cued for feet proprioception ( wider BOS)   2 laps walking forward , quick speed   Seated rest break 5'  BP levels indicated above. Pre /Post Tx were normal. Pt reported no  Sx during and after the session.        PT Long Term Goals - 10/26/16 2014      PT LONG TERM GOAL #1   Title Pt will improve 10 MWS to .8 M/S to improve functional activity tolerance/gait   Baseline .2 M/S, 10/26/16: 0.83 m/s   Time 8   Period Weeks   Status Achieved     PT LONG TERM GOAL #2   Title Pt will be I with HEP to improve B hip strength to 5/5 to improve tolerance for gait   Baseline 3/5 R, 4/5 L; 10/23/16: hip flex: 4-/5 bil, hip abduction: 4-/5, hip adduction 4/5   Time 12   Period Weeks   Status On-going     PT LONG TERM GOAL #3   Title pt will be able to sit<>stand with single UE support for decr. balance   Baseline definite use of UE for support for  this   Time 8   Period Weeks   Status Achieved     PT LONG TERM GOAL #4   Title Pt will improve 10 MWS to 1.0 M/S to improve functional activity tolerance/gait   Baseline 0.2 m/s, 10/23/16: 0.83 m/s   Time 12   Period Weeks   Status New     PT LONG TERM GOAL #5   Title Pt will decrease 5TSTS by at least 3 seconds in order to demonstrate clinically significant improvement in LE strength    Baseline 10/23/16: 16.43 s   Time 12   Period Weeks   Status New     Additional Long Term Goals   Additional Long Term Goals Yes     PT LONG TERM GOAL #6   Title Pt will increase 6MWT by at least 74m (187ft) in order to demonstrate clinically significant improvement in cardiopulmonary endurance and community ambulation   Baseline 10/23/16: 925'   Time 12   Status New               Plan - 12/30/16 1135    Clinical Impression Statement Pt continue to progress well with introduction of HIIT and walking with a weighted waist belt for increased challenge for postural stability. Her BP were normal pre and post Tx.  Pt continues to benefit from skilled PT.     Rehab Potential Good   Clinical Impairments Affecting Rehab Potential Pro: motivation. .Con: chronicity of pain   PT Frequency 2x / week   PT Duration 12 weeks   PT Treatment/Interventions ADLs/Self Care Home Management;Aquatic Therapy;Therapeutic activities;Therapeutic exercise;Manual techniques;Gait training;Neuromuscular re-education;Balance training;DME Instruction;Patient/family education   PT Next Visit Plan Review HEP in greater details and update as appropriate, land-based strengthening program   PT Home Exercise Plan Sidelying hip abduction 2 x 10 qd, hooklying bridges 2 x 15 qd, sit to stand 2 x1 5 BID   Consulted and Agree with Plan of Care Patient      Patient will benefit from skilled therapeutic intervention in order to improve the following deficits and impairments:  Pain, Improper body mechanics, Postural dysfunction, Difficulty walking, Decreased balance, Decreased strength, Decreased range of motion, Decreased endurance, Hypomobility, Prosthetic Dependency, Obesity, Impaired flexibility  Visit Diagnosis: Difficulty in walking, not elsewhere classified  Muscle weakness (generalized)  Chronic right-sided low back pain with right-sided sciatica  Pain in right hip     Problem List Patient Active Problem List   Diagnosis Date Noted  . Benign endometrial hyperplasia 07/22/2016  . Obesity (BMI 35.0-39.9 without comorbidity) 07/15/2016  . Thickened endometrium 07/15/2016  . Endometrial polyp 07/15/2016  . Postmenopausal bleeding 07/15/2016  . Class 3 obesity due to excess calories with body mass index (BMI) of 50.0 to 59.9 in adult ([place]) 07/15/2016  . Family history of breast cancer in first degree relative 07/15/2016  . Family history of ovarian cancer 07/15/2016    [name] ,PT, DPT, E-RYT  12/30/2016, 11:38 AM  [place] MAIN [facility] SERVICES [address] [place],  [place], [address] Phone: [phone]   Fax:  [phone]  Name: Whitney [name] MRN: [id number] Date of Birth: [birth year]

## 2017-01-01 ENCOUNTER — Ambulatory Visit: Payer: Medicare Other | Admitting: Physical Therapy

## 2017-01-01 VITALS — BP 140/80

## 2017-01-01 DIAGNOSIS — M6281 Muscle weakness (generalized): Secondary | ICD-10-CM | POA: Diagnosis not present

## 2017-01-01 DIAGNOSIS — R262 Difficulty in walking, not elsewhere classified: Secondary | ICD-10-CM

## 2017-01-01 DIAGNOSIS — M25551 Pain in right hip: Secondary | ICD-10-CM

## 2017-01-01 DIAGNOSIS — G8929 Other chronic pain: Secondary | ICD-10-CM

## 2017-01-01 DIAGNOSIS — M5441 Lumbago with sciatica, right side: Secondary | ICD-10-CM

## 2017-01-01 NOTE — Therapy (Signed)
[place] MAIN [facility] SERVICES [address] [place], [place], [address] Phone: [phone]   Fax:  [phone]  Physical Therapy Treatment  Patient Details  Name: Whitney [name] MRN: [id number] Date of Birth: [birth year] Referring Provider: Gurney Maxin  Encounter Date: 01/01/2017      PT End of Session - 01/01/17 1105    Visit Number 23   Number of Visits 41   Date for PT Re-Evaluation 01/15/17   Authorization Type g code 3/10   PT Start Time 1103   PT Stop Time 1145   PT Time Calculation (min) 42 min   Activity Tolerance Patient tolerated treatment well   Behavior During Therapy [facility] for tasks assessed/performed      Past Medical History:  Diagnosis Date  . Abnormal Pap smear of cervix    Pt states she had colposcopy   . Hypertension   . PMB (postmenopausal bleeding)     Past Surgical History:  Procedure Laterality Date  . KIDNEY SURGERY  1979    Vitals:   01/01/17 1003 01/01/17 1058  BP: 140/70 140/80        Subjective Assessment - 01/01/17 1058    Subjective Pt has no complaints today      O: Pt entered/exited the pool via steps with single UE support on rail.  50 ft =1 lap  Exercises performed in 3'6" depth   Seated warmup  LAQ 2'  Shoulder depression, hands at side: 30 sec x 4 sets Hip abd/add   At 4'0" depth Forward walking with weighted waist belt  2 laps Forward walking with noodle behind back, at hip level to activate lats  2 laps   Step ups with single UE on rail:  20 reps each leg   Seated cool down 5'                   PT Long Term Goals - 01/01/17 1123      PT LONG TERM GOAL #1   Title Pt will improve 10 MWS to .8 M/S to improve functional activity tolerance/gait   Baseline .2 M/S, 10/26/16: 0.83 m/s   Time 8   Period Weeks   Status Achieved     PT LONG TERM GOAL #2   Title Pt will be I with HEP to improve B hip strength to 5/5 to improve tolerance for gait   Baseline 3/5 R, 4/5 L; 10/23/16: hip flex: 4-/5 bil, hip abduction: 4-/5, hip adduction 4/5   Time 12   Period Weeks   Status On-going     PT LONG TERM GOAL #3   Title pt will be able to sit<>stand with single UE support for decr. balance   Baseline definite use of UE for support for this   Time 8   Period Weeks   Status Achieved     PT LONG TERM GOAL #4   Title Pt will improve 10 MWS to 1.0 M/S to improve functional activity tolerance/gait   Baseline 0.2 m/s, 10/23/16: 0.83 m/s   Time 12   Period Weeks   Status On-going     PT LONG TERM GOAL #5   Title Pt will decrease 5TSTS by at least 3 seconds in order to demonstrate clinically significant improvement in LE strength    Baseline 10/23/16: 16.43 s   Time 12   Period Weeks   Status On-going     PT LONG TERM GOAL #6   Title Pt will increase 6MWT by  at least 6m (169ft) in order to demonstrate clinically significant improvement in cardiopulmonary endurance and community ambulation   Baseline 10/23/16: 925'   Time 12   Status On-going               Plan - 01/01/17 1120    Clinical Impression Statement Pt continues to have no BP issues pre and post session. Pt continues to tolerate progressions of exercises. Pt was educated on seated core and hip strengthening exercises which she can do at home. Initiated step up exercise and eccentric control of hamstrings and plantarflexor mm while using single UE support on rail. Pt tolerated thoracolumbar strengthening exercise with gait training. Pt will be returning to land appts following today .  Pt continues to benefit from skilled PT.    Rehab Potential Good   Clinical Impairments Affecting Rehab Potential Pro: motivation. .Con: chronicity of pain   PT Frequency 2x / week   PT Duration 12 weeks   PT Treatment/Interventions ADLs/Self Care Home Management;Aquatic Therapy;Therapeutic activities;Therapeutic exercise;Manual techniques;Gait training;Neuromuscular re-education;Balance  training;DME Instruction;Patient/family education   PT Next Visit Plan Review HEP in greater details and update as appropriate, land-based strengthening program   PT Home Exercise Plan Sidelying hip abduction 2 x 10 qd, hooklying bridges 2 x 15 qd, sit to stand 2 x1 5 BID   Consulted and Agree with Plan of Care Patient      Patient will benefit from skilled therapeutic intervention in order to improve the following deficits and impairments:  Pain, Improper body mechanics, Postural dysfunction, Difficulty walking, Decreased balance, Decreased strength, Decreased range of motion, Decreased endurance, Hypomobility, Prosthetic Dependency, Obesity, Impaired flexibility  Visit Diagnosis: Difficulty in walking, not elsewhere classified  Muscle weakness (generalized)  Chronic right-sided low back pain with right-sided sciatica  Pain in right hip     Problem List Patient Active Problem List   Diagnosis Date Noted  . Benign endometrial hyperplasia 07/22/2016  . Obesity (BMI 35.0-39.9 without comorbidity) 07/15/2016  . Thickened endometrium 07/15/2016  . Endometrial polyp 07/15/2016  . Postmenopausal bleeding 07/15/2016  . Class 3 obesity due to excess calories with body mass index (BMI) of 50.0 to 59.9 in adult ([place]) 07/15/2016  . Family history of breast cancer in first degree relative 07/15/2016  . Family history of ovarian cancer 07/15/2016    Whitney [name] ,PT, DPT, E-RYT  01/01/2017, 11:24 AM  [place] MAIN [facility] SERVICES [address] [place], [place], [address] Phone: [phone]   Fax:  [phone]  Name: Whitney [name] MRN: [id number] Date of Birth: 05-Aug-1950

## 2017-01-05 ENCOUNTER — Ambulatory Visit: Payer: Medicare Other | Attending: Neurology

## 2017-01-05 DIAGNOSIS — M6281 Muscle weakness (generalized): Secondary | ICD-10-CM | POA: Diagnosis present

## 2017-01-05 DIAGNOSIS — R262 Difficulty in walking, not elsewhere classified: Secondary | ICD-10-CM

## 2017-01-05 DIAGNOSIS — M5441 Lumbago with sciatica, right side: Secondary | ICD-10-CM | POA: Diagnosis present

## 2017-01-05 DIAGNOSIS — M25551 Pain in right hip: Secondary | ICD-10-CM | POA: Insufficient documentation

## 2017-01-05 DIAGNOSIS — G8929 Other chronic pain: Secondary | ICD-10-CM | POA: Diagnosis present

## 2017-01-05 NOTE — Therapy (Signed)
[place] [facility] [address] [place], [address] Phone: [phone]   Fax:  [phone]  Physical Therapy Treatment  Patient Details  Name: Whitney [name] MRN: [id number] Date of Birth: 10-16-1950 Referring Provider: Gurney Maxin  Encounter Date: 01/05/2017      PT End of Session - 01/05/17 0921    Visit Number 24   Number of Visits 41   Date for PT Re-Evaluation 01/15/17   Authorization Type g code 4/10   PT Start Time 0900   PT Stop Time 0945   PT Time Calculation (min) 45 min   Activity Tolerance Patient tolerated treatment well   Behavior During Therapy [facility] for tasks assessed/performed      Past Medical History:  Diagnosis Date  . Abnormal Pap smear of cervix    Pt states she had colposcopy   . Hypertension   . PMB (postmenopausal bleeding)     Past Surgical History:  Procedure Laterality Date  . KIDNEY SURGERY  1979    There were no vitals filed for this visit.      Subjective Assessment - 01/05/17 0917    Subjective Pateient reports she continues to have knee pain but is feeling much improved since her previous land treatment.    Currently in Pain? Yes   Pain Score 7    Pain Location Knee   Pain Orientation Left   Pain Descriptors / Indicators Aching   Pain Type Acute pain   Pain Onset More than a month ago      TREATMENT: Sit to stands from a raised table - 2 x 25 Seated Leg Press from [place] -  x 20 55#, x20 65# Resisted hip abduction in sitting - x 20 after manual resistance Side stepping across airex beam - x 10 down andback ~42ft Seated ball kick outs - to decrease pain and improve AROM. - 1 min Hip abduction on airex beam - x 15 with B UE support Patient demonstrates increased knee soreness at end of session and LE fatigue       PT Education - 01/05/17 0921    Education provided Yes   Education Details Educated on performing sit to stands from raised surfacre at home   Person(s) Educated Patient   Methods Explanation;Demonstration   Comprehension Verbalized understanding;Returned demonstration             PT Long Term Goals - 01/01/17 1123      PT LONG TERM GOAL #1   Title Pt will improve 10 MWS to .8 M/S to improve functional activity tolerance/gait   Baseline .2 M/S, 10/26/16: 0.83 m/s   Time 8   Period Weeks   Status Achieved     PT LONG TERM GOAL #2   Title Pt will be I with HEP to improve B hip strength to 5/5 to improve tolerance for gait   Baseline 3/5 R, 4/5 L; 10/23/16: hip flex: 4-/5 bil, hip abduction: 4-/5, hip adduction 4/5   Time 12   Period Weeks   Status On-going     PT LONG TERM GOAL #3   Title pt will be able to sit<>stand with single UE support for decr. balance   Baseline definite use of UE for support for this   Time 8   Period Weeks   Status Achieved     PT LONG TERM GOAL #4   Title Pt will improve 10 MWS to 1.0 M/S to improve functional activity tolerance/gait  Baseline 0.2 m/s, 10/23/16: 0.83 m/s   Time 12   Period Weeks   Status On-going     PT LONG TERM GOAL #5   Title Pt will decrease 5TSTS by at least 3 seconds in order to demonstrate clinically significant improvement in LE strength    Baseline 10/23/16: 16.43 s   Time 12   Period Weeks   Status On-going     PT LONG TERM GOAL #6   Title Pt will increase 6MWT by at least 9m (163ft) in order to demonstrate clinically significant improvement in cardiopulmonary endurance and community ambulation   Baseline 10/23/16: 925'   Time 12   Status On-going               Plan - 01/05/17 0928    Clinical Impression Statement Patient demonstrates decreased LE strength and muscular fatigue requiring frequent sitting rest breaks througout entirity of session. Adjusted exercises as to not aggravate knee pain. Patient demonstrates poor hip control with ambulating with a Wide BOS and B hip circumduction. Patient will benefit from further physical therapy focused  on improving leg strengthening to return to prior level of function.    Rehab Potential Good   Clinical Impairments Affecting Rehab Potential Pro: motivation. .Con: chronicity of pain   PT Frequency 2x / week   PT Duration 12 weeks   PT Treatment/Interventions ADLs/Self Care Home Management;Aquatic Therapy;Therapeutic activities;Therapeutic exercise;Manual techniques;Gait training;Neuromuscular re-education;Balance training;DME Instruction;Patient/family education   PT Next Visit Plan Review HEP in greater details and update as appropriate, land-based strengthening program   PT Home Exercise Plan Sidelying hip abduction 2 x 10 qd, hooklying bridges 2 x 15 qd, sit to stand 2 x1 5 BID   Consulted and Agree with Plan of Care Patient      Patient will benefit from skilled therapeutic intervention in order to improve the following deficits and impairments:  Pain, Improper body mechanics, Postural dysfunction, Difficulty walking, Decreased balance, Decreased strength, Decreased range of motion, Decreased endurance, Hypomobility, Prosthetic Dependency, Obesity, Impaired flexibility  Visit Diagnosis: Difficulty in walking, not elsewhere classified  Muscle weakness (generalized)     Problem List Patient Active Problem List   Diagnosis Date Noted  . Benign endometrial hyperplasia 07/22/2016  . Obesity (BMI 35.0-39.9 without comorbidity) 07/15/2016  . Thickened endometrium 07/15/2016  . Endometrial polyp 07/15/2016  . Postmenopausal bleeding 07/15/2016  . Class 3 obesity due to excess calories with body mass index (BMI) of 50.0 to 59.9 in adult ([place]) 07/15/2016  . Family history of breast cancer in first degree relative 07/15/2016  . Family history of ovarian cancer 07/15/2016    Blythe Stanford, PT DPT 01/05/2017, 9:49 AM  [place] [facility] [address], [place], [address] Phone: [phone]   Fax:  [phone]  Name:  Whitney [name] MRN: [id number] Date of Birth: October 26, 1950

## 2017-01-07 ENCOUNTER — Ambulatory Visit: Payer: Medicare Other

## 2017-01-07 VITALS — BP 140/96

## 2017-01-07 DIAGNOSIS — M6281 Muscle weakness (generalized): Secondary | ICD-10-CM

## 2017-01-07 DIAGNOSIS — R262 Difficulty in walking, not elsewhere classified: Secondary | ICD-10-CM

## 2017-01-07 NOTE — Therapy (Signed)
[place] [facility] [address] [place], [address] Phone: [phone]   Fax:  [phone]  Physical Therapy Treatment  Patient Details  Name: Whitney [name] MRN: [id number] Date of Birth: 22-Sep-1950 Referring Provider: Gurney Maxin  Encounter Date: 01/07/2017      PT End of Session - 01/07/17 1321    Visit Number 25   Number of Visits 41   Date for PT Re-Evaluation 01/15/17   Authorization Type g code 5/10   PT Start Time 1300   PT Stop Time 1345   PT Time Calculation (min) 45 min   Activity Tolerance Patient tolerated treatment well   Behavior During Therapy [facility] for tasks assessed/performed      Past Medical History:  Diagnosis Date  . Abnormal Pap smear of cervix    Pt states she had colposcopy   . Hypertension   . PMB (postmenopausal bleeding)     Past Surgical History:  Procedure Laterality Date  . KIDNEY SURGERY  1979    Vitals:   01/07/17 1303  BP: (!) 140/96        Subjective Assessment - 01/07/17 1303    Subjective Patient reports she continues to have knee pain but is decreased today versus the previous treatment.    Currently in Pain? Yes   Pain Score 3    Pain Location Knee   Pain Orientation Left   Pain Descriptors / Indicators Aching   Pain Type Acute pain   Pain Onset More than a month ago   Pain Frequency Intermittent      TREATMENT: Sit to stands from chair with airex pad on top - 2 x 25 Seated Leg Press from [place] -  x20 65#, x 10 75# Positioned on Leg Press; glute squeeze ball squeeze with 2kg ball - 2x 10  HIIT - 30sec on 30sec off intensity level 7 for 6 min at [employer] with UE support - x15 with focus on glute activation CKC lumbar/hip extension - x20 on airex [place] on blue airex pad with UE support - x 20 B  Patient demonstrates increased fatigue at end of session       PT Education - 01/07/17 1317    Education provided Yes   Education  Details Form/technique with exercise   Person(s) Educated Patient   Methods Explanation;Demonstration   Comprehension Verbalized understanding;Returned demonstration             PT Long Term Goals - 01/01/17 1123      PT LONG TERM GOAL #1   Title Pt will improve 10 MWS to .8 M/S to improve functional activity tolerance/gait   Baseline .2 M/S, 10/26/16: 0.83 m/s   Time 8   Period Weeks   Status Achieved     PT LONG TERM GOAL #2   Title Pt will be I with HEP to improve B hip strength to 5/5 to improve tolerance for gait   Baseline 3/5 R, 4/5 L; 10/23/16: hip flex: 4-/5 bil, hip abduction: 4-/5, hip adduction 4/5   Time 12   Period Weeks   Status On-going     PT LONG TERM GOAL #3   Title pt will be able to sit<>stand with single UE support for decr. balance   Baseline definite use of UE for support for this   Time 8   Period Weeks   Status Achieved     PT LONG TERM GOAL #4   Title Pt will  improve 10 MWS to 1.0 M/S to improve functional activity tolerance/gait   Baseline 0.2 m/s, 10/23/16: 0.83 m/s   Time 12   Period Weeks   Status On-going     PT LONG TERM GOAL #5   Title Pt will decrease 5TSTS by at least 3 seconds in order to demonstrate clinically significant improvement in LE strength    Baseline 10/23/16: 16.43 s   Time 12   Period Weeks   Status On-going     PT LONG TERM GOAL #6   Title Pt will increase 6MWT by at least 65m (166ft) in order to demonstrate clinically significant improvement in cardiopulmonary endurance and community ambulation   Baseline 10/23/16: 925'   Time 12   Status On-going               Plan - 01/07/17 1323    Clinical Impression Statement Monitored BP throughout session and patients BP did not exceed 140/85 throughout session. Patient demonstrates increase in glute fatigue after performing  glute squeeze / ball squeeze exercise indicating increased glute weakness. Patient will benefit from further skilled therapy to return to  prior level of function.    Rehab Potential Good   Clinical Impairments Affecting Rehab Potential Pro: motivation. .Con: chronicity of pain   PT Frequency 2x / week   PT Duration 12 weeks   PT Treatment/Interventions ADLs/Self Care Home Management;Aquatic Therapy;Therapeutic activities;Therapeutic exercise;Manual techniques;Gait training;Neuromuscular re-education;Balance training;DME Instruction;Patient/family education   PT Next Visit Plan Review HEP in greater details and update as appropriate, land-based strengthening program   PT Home Exercise Plan Sidelying hip abduction 2 x 10 qd, hooklying bridges 2 x 15 qd, sit to stand 2 x1 5 BID   Consulted and Agree with Plan of Care Patient      Patient will benefit from skilled therapeutic intervention in order to improve the following deficits and impairments:  Pain, Improper body mechanics, Postural dysfunction, Difficulty walking, Decreased balance, Decreased strength, Decreased range of motion, Decreased endurance, Hypomobility, Prosthetic Dependency, Obesity, Impaired flexibility  Visit Diagnosis: Difficulty in walking, not elsewhere classified  Muscle weakness (generalized)     Problem List Patient Active Problem List   Diagnosis Date Noted  . Benign endometrial hyperplasia 07/22/2016  . Obesity (BMI 35.0-39.9 without comorbidity) 07/15/2016  . Thickened endometrium 07/15/2016  . Endometrial polyp 07/15/2016  . Postmenopausal bleeding 07/15/2016  . Class 3 obesity due to excess calories with body mass index (BMI) of 50.0 to 59.9 in adult ([place]) 07/15/2016  . Family history of breast cancer in first degree relative 07/15/2016  . Family history of ovarian cancer 07/15/2016    Blythe Stanford, PT DPT 01/07/2017, 1:44 PM  [place] [facility] [address], [place], [address] Phone: [phone]   Fax:  [phone]  Name: Whitney [name] MRN: [id number] Date of Birth:  01/25/1951

## 2017-01-20 DIAGNOSIS — M7540 Impingement syndrome of unspecified shoulder: Secondary | ICD-10-CM | POA: Insufficient documentation

## 2017-01-20 DIAGNOSIS — M542 Cervicalgia: Secondary | ICD-10-CM | POA: Insufficient documentation

## 2017-01-22 ENCOUNTER — Ambulatory Visit: Payer: Medicare Other

## 2017-01-27 ENCOUNTER — Ambulatory Visit (INDEPENDENT_AMBULATORY_CARE_PROVIDER_SITE_OTHER): Payer: Medicare Other | Admitting: Obstetrics and Gynecology

## 2017-01-27 ENCOUNTER — Encounter: Payer: Self-pay | Admitting: Obstetrics and Gynecology

## 2017-01-27 VITALS — BP 144/78 | HR 70 | Ht 65.0 in | Wt 252.1 lb

## 2017-01-27 DIAGNOSIS — Z803 Family history of malignant neoplasm of breast: Secondary | ICD-10-CM | POA: Diagnosis not present

## 2017-01-27 DIAGNOSIS — Z8041 Family history of malignant neoplasm of ovary: Secondary | ICD-10-CM

## 2017-01-27 DIAGNOSIS — N950 Postmenopausal bleeding: Secondary | ICD-10-CM | POA: Diagnosis not present

## 2017-01-27 DIAGNOSIS — N8501 Benign endometrial hyperplasia: Secondary | ICD-10-CM | POA: Diagnosis not present

## 2017-01-27 NOTE — Progress Notes (Signed)
GYN ENCOUNTER NOTE  Subjective:       Whitney [name] is a 66 y.o. G51P1011 female with a history of benign endometrial hyperplasia and polyp is here for gynecologic evaluation of the following issues:  1. Abnormal Uterine Bleeding   Continues to have light to heavy abnormal uterine bleeding while on Megace 40 mg twice daily. Bleeding is 3 out of 4 weeks a month and she uses aprox 10 tampons a day on average. At the start of her cycle she endorses pain in the lower abdomen and lower back pain.  States her PCP wouldn't clear her for surgery until her BP was better controled. Shoulder and knee injections last week from [facility].  Gynecologic History Patient's last menstrual period was 01/26/2017. Contraception: none Last Pap: UNK. Results were:  Last mammogram: 10/06/2016 Results were:  IMPRESSION:  Probable benign asymmetry in the superior aspect of the left breast.  RECOMMENDATION:  Short-term interval follow-up left mammogram and ultrasound in 6  months is recommended.  Obstetric History OB History  Gravida Para Term Preterm AB Living  2 1 1   1 1   SAB TAB Ectopic Multiple Live Births  1       1    # Outcome Date GA Lbr Len/2nd Weight Sex Delivery Anes PTL Lv  2 Term 48    F Vag-Spont   LIV  1 SAB              Past Medical History:  Diagnosis Date  . Abnormal Pap smear of cervix    Pt states she had colposcopy   . Hx of unilateral nephrectomy    Left Nephrectomy  . Hypertension   . PMB (postmenopausal bleeding)    Past Surgical History:  Procedure Laterality Date  . KIDNEY SURGERY  1979   Current Outpatient Prescriptions on File Prior to Visit  Medication Sig Dispense Refill  . bisoprolol-hydrochlorothiazide (ZIAC) 5-6.25 MG tablet Take by mouth.    . famotidine (PEPCID) 20 MG tablet Take by mouth.    . gabapentin (NEURONTIN) 100 MG capsule     . megestrol (MEGACE) 40 MG tablet Take 1 tablet (40 mg total) by mouth 2 (two) times daily. Can increase to two tablets  twice a day in the event of heavy bleeding 60 tablet 5  . rOPINIRole (REQUIP) 0.5 MG tablet Take 0.5 mg by mouth 3 (three) times daily.    . traMADol (ULTRAM) 50 MG tablet Take by mouth every 6 (six) hours as needed.     No current facility-administered medications on file prior to visit.    Allergies  Allergen Reactions  . Enalapril Swelling  . Lisinopril Swelling   Social History   Social History  . Marital status: Divorced    Spouse name: N/A  . Number of children: N/A  . Years of education: N/A   Occupational History  . Not on file.   Social History Main Topics  . Smoking status: Never Smoker  . Smokeless tobacco: Never Used  . Alcohol use Yes     Comment: occasionally  . Drug use: Yes    Frequency: 3.0 times per week    Types: Marijuana     Comment: Pt states she uses for leg pain   . Sexual activity: Yes    Birth control/ protection: None   Other Topics Concern  . Not on file   Social History Narrative  . No narrative on file    Family History  Problem Relation Age of  Onset  . Ovarian cancer Mother   . Hypertension Father   . Diabetes Father   . Breast cancer Sister    The following portions of the patient's history were reviewed and updated as appropriate: allergies, current medications, past family history, past medical history, past social history, past surgical history and problem list.  Review of Systems Review of Systems  Constitutional: Negative for chills, diaphoresis, fever, malaise/fatigue and weight loss.  Respiratory: Negative for shortness of breath and wheezing.   Gastrointestinal: Positive for abdominal pain (At the begining of her cycle). Negative for blood in stool, diarrhea, nausea and vomiting.  Genitourinary: Negative for dysuria, frequency and urgency.  Musculoskeletal: Positive for back pain (At the begining of her cycle).  Neurological: Negative for weakness.  All other systems reviewed and are negative.  Objective:   BP (!)  144/78   Pulse 70   Ht 5' 5"  (1.651 m)   Wt 252 lb 1.6 oz (114.4 kg)   LMP 01/26/2017   BMI 41.95 kg/m  CONSTITUTIONAL: Well-developed, well-nourished female in no acute distress.  HENT:  Normocephalic, atraumatic.  NECK: Normal range of motion, supple, no masses.  Normal thyroid.  SKIN: Skin is warm and dry. No rash noted. Not diaphoretic. No erythema. No pallor. [place]: Alert and oriented to person, place, and time. PSYCHIATRIC: Normal mood and affect. Normal behavior. Normal judgment and thought content. CARDIOVASCULAR:Not Examined RESPIRATORY: Not Examined BREASTS: Not Examined ABDOMEN: Soft, non distended; Non tender.  No Organomegaly. PELVIC:  External Genitalia: Normal  BUS: Normal  Vagina: Normal  Cervix: Normal, mobile, nontender; minimal blood at os  Uterus: Normal size, shape,consistency, mobile, midplane  Adnexa: Normal, nonpalpable, nontender   RV: Normal external exam; internal exam deferred   Bladder: Nontender  Assessment:  1. Postmenopausal bleeding, Persisting despite Megace therapy 40 mg twice a day 2. Benign endometrial hyperplasia 3. Family history of ovarian cancer 4. Family history of breast cancer in first degree relative 5. Morbid Obesity   Plan:  1. Postmenopausal bleeding; increase Megace to 80 mg BID 2. Benign endometrial hyperplasia; Hysteroscopy/D&C scheduled 13 February 2017 3. Family history of ovarian cancer; Offered to consider genetic testing  4. Family history of breast cancer in first degree relative; Continue US/Mammogram screening and consider genetic testing for BRCA 1/2 mutations  5. Morbid Obesity  A total of 15 minutes were spent face-to-face with the patient during this encounter and over half of that time dealt with counseling and coordination of care.  Carlene Coria, Student PA [employer]  Brayton Mars, MD   I have seen, interviewed, and examined the patient in conjunction with the [facility].A. student and  affirm the diagnosis and management plan. Harlyn Rathmann A. Rece Zechman, MD, FACOG literature is will [employer] good   Note: This dictation was prepared with Dragon dictation along with smaller phrase technology. Any transcriptional errors that result from this process are unintentional.

## 2017-01-27 NOTE — Patient Instructions (Signed)
1. Pelvic ultrasound is scheduled 2. Hysteroscopy/D&C is scheduled for 02/16/2017 3. Return in 1 week prior surgery for preoperative appointment

## 2017-01-28 ENCOUNTER — Other Ambulatory Visit: Payer: Self-pay | Admitting: Obstetrics and Gynecology

## 2017-01-28 DIAGNOSIS — N939 Abnormal uterine and vaginal bleeding, unspecified: Secondary | ICD-10-CM

## 2017-01-29 ENCOUNTER — Ambulatory Visit (INDEPENDENT_AMBULATORY_CARE_PROVIDER_SITE_OTHER): Payer: Medicare Other

## 2017-01-29 ENCOUNTER — Ambulatory Visit: Payer: Medicare Other | Admitting: Physical Therapy

## 2017-01-29 DIAGNOSIS — N939 Abnormal uterine and vaginal bleeding, unspecified: Secondary | ICD-10-CM | POA: Diagnosis not present

## 2017-01-29 DIAGNOSIS — M25551 Pain in right hip: Secondary | ICD-10-CM

## 2017-01-29 DIAGNOSIS — M5441 Lumbago with sciatica, right side: Secondary | ICD-10-CM

## 2017-01-29 DIAGNOSIS — G8929 Other chronic pain: Secondary | ICD-10-CM

## 2017-01-29 DIAGNOSIS — R262 Difficulty in walking, not elsewhere classified: Secondary | ICD-10-CM

## 2017-01-29 DIAGNOSIS — M6281 Muscle weakness (generalized): Secondary | ICD-10-CM

## 2017-01-29 NOTE — Therapy (Signed)
[place] [facility]IN [facility] SERVICES [address] [place], [place], [address] Phone: [phone]   Fax:  [phone]  Physical Therapy Treatment  Patient Details  Name: Whitney [name] MRN: [id number] Date of Birth: 04-03-1951 Referring Provider: Gurney Maxin  Encounter Date: 01/29/2017      PT End of Session - 01/29/17 1222    Visit Number 26   Number of Visits 41   Date for PT Re-Evaluation 01/15/17   Authorization Type g code 6/10   PT Start Time 1120   PT Stop Time 1210   PT Time Calculation (min) 50 min   Activity Tolerance Patient tolerated treatment well   Behavior During Therapy [facility] for tasks assessed/performed      Past Medical History:  Diagnosis Date  . Abnormal Pap smear of cervix    Pt states she had colposcopy   . Hx of unilateral nephrectomy    Left Nephrectomy  . Hypertension   . PMB (postmenopausal bleeding)     Past Surgical History:  Procedure Laterality Date  . KIDNEY SURGERY  1979    There were no vitals filed for this visit.      Subjective Assessment - 01/29/17 1216    Subjective Pt reports she is scheduling her knee surgery in end of Sept or Nov. Pt has been asked to lose 8 more lbs by her surgeon to be fit from surgery. Pt was told to avoid land exercises due her knee being bone on bone and that aquatic Tx would be best for her to do.    Pain Onset More than a month ago        O: Pt entered the pool via steps, exited via ramp with single UE support on rail.  50 ft =1 lap  Exercises performed in 3'6" depth    Seated on bench:  Leg exercsies: scissors,hip abd/add, bicycling 10 min 10 reps of mini squats x 3 20 ft lateral scoots along bench L, and R each Plantigrade position BUE on bench, 10 reps of hip ext x 3 on each leg   noodles by side for shoulder depression:  2 laps walking 2 laps walking backwards  2 laps sidestepping   Seated on bench: relaxation 10' not charged                                 PT Long Term Goals - 01/01/17 1123      PT LONG TERM GOAL #1   Title Pt will improve 10 MWS to .8 M/S to improve functional activity tolerance/gait   Baseline .2 M/S, 10/26/16: 0.83 m/s   Time 8   Period Weeks   Status Achieved     PT LONG TERM GOAL #2   Title Pt will be I with HEP to improve B hip strength to 5/5 to improve tolerance for gait   Baseline 3/5 R, 4/5 L; 10/23/16: hip flex: 4-/5 bil, hip abduction: 4-/5, hip adduction 4/5   Time 12   Period Weeks   Status On-going     PT LONG TERM GOAL #3   Title pt will be able to sit<>stand with single UE support for decr. balance   Baseline definite use of UE for support for this   Time 8   Period Weeks   Status Achieved     PT LONG TERM GOAL #4   Title Pt will improve 10 MWS to 1.0 M/S  to improve functional activity tolerance/gait   Baseline 0.2 m/s, 10/23/16: 0.83 m/s   Time 12   Period Weeks   Status On-going     PT LONG TERM GOAL #5   Title Pt will decrease 5TSTS by at least 3 seconds in order to demonstrate clinically significant improvement in LE strength    Baseline 10/23/16: 16.43 s   Time 12   Period Weeks   Status On-going     PT LONG TERM GOAL #6   Title Pt will increase 6MWT by at least 100m (144ft) in order to demonstrate clinically significant improvement in cardiopulmonary endurance and community ambulation   Baseline 10/23/16: 925'   Time 12   Status On-going               Plan - 01/29/17 1222    Clinical Impression Statement Pt tolerated pool session with 130/94 mm Hg pre Tx, 130/90 mm Hg post Tx. Introduced lateral scoots along bench to strengthening latissmuss mm for thoracolumbar/ posterior lower kinetic chain strengthening.  Glut extenstion exercises were performed in plantigrade position without knee complaints. Pt performed gait training with lower trap strengthening. Pt continues to benefit from skilled PT. Plan to have pt sidestep along  ramp instead of steps into/ out of the pool to minimize strain on knees.    Rehab Potential Good   Clinical Impairments Affecting Rehab Potential Pro: motivation. .Con: chronicity of pain   PT Frequency 2x / week   PT Duration 12 weeks   PT Treatment/Interventions ADLs/Self Care Home Management;Aquatic Therapy;Therapeutic activities;Therapeutic exercise;Manual techniques;Gait training;Neuromuscular re-education;Balance training;DME Instruction;Patient/family education   PT Next Visit Plan Review HEP in greater details and update as appropriate, land-based strengthening program   PT Home Exercise Plan Sidelying hip abduction 2 x 10 qd, hooklying bridges 2 x 15 qd, sit to stand 2 x1 5 BID   Consulted and Agree with Plan of Care Patient      Patient will benefit from skilled therapeutic intervention in order to improve the following deficits and impairments:  Pain, Improper body mechanics, Postural dysfunction, Difficulty walking, Decreased balance, Decreased strength, Decreased range of motion, Decreased endurance, Hypomobility, Prosthetic Dependency, Obesity, Impaired flexibility  Visit Diagnosis: Difficulty in walking, not elsewhere classified  Muscle weakness (generalized)  Chronic right-sided low back pain with right-sided sciatica  Pain in right hip     Problem List Patient Active Problem List   Diagnosis Date Noted  . Benign endometrial hyperplasia 07/22/2016  . Obesity (BMI 35.0-39.9 without comorbidity) 07/15/2016  . Thickened endometrium 07/15/2016  . Endometrial polyp 07/15/2016  . Postmenopausal bleeding 07/15/2016  . Class 3 obesity due to excess calories with body mass index (BMI) of 50.0 to 59.9 in adult ([place]) 07/15/2016  . Family history of breast cancer in first degree relative 07/15/2016  . Family history of ovarian cancer 07/15/2016     [name] ,PT, DPT, E-RYT  01/29/2017, 12:25 PM  [place] [facility]  SERVICES [address] [place], [place], [address] Phone: [phone]   Fax:  [phone]  Name: Whitney [name] MRN: [id number] Date of Birth: Aug 11, 1950

## 2017-02-05 ENCOUNTER — Ambulatory Visit: Payer: Medicare Other | Attending: Neurology | Admitting: Physical Therapy

## 2017-02-10 ENCOUNTER — Ambulatory Visit: Payer: Medicare Other | Admitting: Physical Therapy

## 2017-02-11 ENCOUNTER — Ambulatory Visit (INDEPENDENT_AMBULATORY_CARE_PROVIDER_SITE_OTHER): Payer: Medicare Other | Admitting: Obstetrics and Gynecology

## 2017-02-11 ENCOUNTER — Encounter: Payer: Self-pay | Admitting: Obstetrics and Gynecology

## 2017-02-11 VITALS — BP 168/95 | HR 73 | Ht 65.0 in | Wt 249.2 lb

## 2017-02-11 DIAGNOSIS — R9389 Abnormal findings on diagnostic imaging of other specified body structures: Secondary | ICD-10-CM

## 2017-02-11 DIAGNOSIS — N840 Polyp of corpus uteri: Secondary | ICD-10-CM

## 2017-02-11 DIAGNOSIS — R938 Abnormal findings on diagnostic imaging of other specified body structures: Secondary | ICD-10-CM

## 2017-02-11 DIAGNOSIS — N950 Postmenopausal bleeding: Secondary | ICD-10-CM

## 2017-02-11 DIAGNOSIS — Z01818 Encounter for other preprocedural examination: Secondary | ICD-10-CM

## 2017-02-11 DIAGNOSIS — N8501 Benign endometrial hyperplasia: Secondary | ICD-10-CM

## 2017-02-11 NOTE — Progress Notes (Signed)
Subjective:  PREOPERATIVE HISTORY AND PHYSICAL   Date of surgery: 02/16/2017 Procedure: Hysteroscopy/D&C Diagnoses: 1. Postmenopausal bleeding 2. Benign endometrial hyperplasia 3. Thickened endometrium on ultrasound 4. Endometrial polyp 5. Morbid obesity   Patient is a 66 y.o. G2P1088female scheduled for hysteroscopy/D&C on 02/16/2017 for evaluation and management of persistent postmenopausal bleeding despite Megace therapy. Preoperative endometrial biopsy demonstrated benign endometrial hyperplasia and endometrial polyp. Preoperative ultrasound had demonstrated a thickened endometrium.  The patient continues to have light to heavy abnormal uterine bleeding while on Megace 40 mg twice daily. Bleeding is 3 out of 4 weeks a month and she uses aprox 10 tampons a day on average. At the start of her cycle she endorses pain in the lower abdomen and lower back pain.    Ultrasound from 02/09/2017: Indications:AUB Findings:  The uterus measures 8.5 x 5.8 x 6.4 cm. Echo texture is heterogenous with evidence of a focal mass. Within the uterus is a suspected fibroid measuring: Fibroid 1: Left fundal intramural 2.7 x 3.1 x 2.9 cm  The Endometrium measures 8 mm.  Right Ovary measures 2.1 x 1.4 x 1.2 cm. It is normal in appearance. Left Ovary measures 2.5 x 1.6 x 2.5 cm. It is normal appearance. Survey of the adnexa demonstrates no adnexal masses. There is no free fluid in the cul de sac.  Impression: 1. Fibroid uterus 2. Thickened Endometrium  Gynecologic History Patient's last menstrual period was 07/13/2016. Contraception: post menopausal status and tubal ligation Last Pap:? Last mammogram: ? Family history of breast cancer in daughter and mother Family history of ovarian cancer in mother  OB History    Gravida Para Term Preterm AB Living   2 1 1   1 1    SAB TAB Ectopic Multiple Live Births   1       1      Patient's last menstrual period was 01/26/2017.    Past Medical  History:  Diagnosis Date  . Abnormal Pap smear of cervix    Pt states she had colposcopy   . Hx of unilateral nephrectomy    Left Nephrectomy  . Hypertension   . PMB (postmenopausal bleeding)     Past Surgical History:  Procedure Laterality Date  . KIDNEY SURGERY  1979    OB History  Gravida Para Term Preterm AB Living  2 1 1   1 1   SAB TAB Ectopic Multiple Live Births  1       1    # Outcome Date GA Lbr Len/2nd Weight Sex Delivery Anes PTL Lv  2 Term 1979    F Vag-Spont   LIV  1 SAB               Social History   Social History  . Marital status: Divorced    Spouse name: N/A  . Number of children: N/A  . Years of education: N/A   Social History Main Topics  . Smoking status: Never Smoker  . Smokeless tobacco: Never Used  . Alcohol use Yes     Comment: occasionally  . Drug use: Yes    Frequency: 3.0 times per week    Types: Marijuana     Comment: Pt states she uses for leg pain   . Sexual activity: Yes    Birth control/ protection: None   Other Topics Concern  . None   Social History Narrative  . None    Family History  Problem Relation Age of Onset  . Ovarian cancer Mother   .  Hypertension Father   . Diabetes Father   . Breast cancer Sister      (Not in a hospital admission)  Allergies  Allergen Reactions  . Enalapril Swelling  . Lisinopril Swelling    Review of Systems Constitutional: No recent fever/chills/sweats Respiratory: No recent cough/bronchitis Cardiovascular: No chest pain Gastrointestinal: No recent nausea/vomiting/diarrhea Genitourinary: No UTI symptoms Hematologic/lymphatic:No history of coagulopathy or recent blood thinner use    Objective:    BP (!) 168/95   Pulse 73   Ht 5\' 5"  (1.651 m)   Wt 249 lb 3.2 oz (113 kg)   LMP 01/26/2017   BMI 41.47 kg/m   General:   Normal  Skin:   normal  HEENT:  Normal  Neck:  Supple without Adenopathy or Thyromegaly  Lungs:   Heart:              Breasts:   Abdomen:   Pelvis:  M/S   Extremeties:  Neuro:    clear to auscultation bilaterally   Normal without murmur   Not Examined   soft, non-tender; bowel sounds normal; no masses,  no organomegaly   Exam deferred to OR  No CVAT  Warm/Dry   Normal        01/27/2017 PELVIC:             External Genitalia: Normal             BUS: Normal             Vagina: Normal             Cervix: Normal, mobile, nontender; minimal blood at os             Uterus: Normal size, shape,consistency, mobile, midplane             Adnexa: Normal, nonpalpable, nontender              RV: Normal external exam; internal exam deferred              Bladder: Nontender  Assessment:    1. Postmenopausal bleeding 2. History of benign endometrial hyperplasia on endometrial biopsy 3. Endometrial polyp on endometrial biopsy 4. Thickened endometrium on ultrasound 5. Morbid obesity   Plan:  Hysteroscopy/D&C  Preop counseling: The patient is to undergo hysteroscopy/D&C for evaluation and management of persistent postmenopausal bleeding despite use of Megace therapy. She is understanding of the planned procedures and is aware of and is accepting of all surgical risks which include but are not limited to bleeding, infection, pelvic organ injury with need for repair, blood clot disorders, anesthesia risks, etc. All questions have been answered. Informed consent is given. Patient is ready and willing to proceed with surgery as scheduled.  Brayton Mars, MD  Note: This dictation was prepared with Dragon dictation along with smaller phrase technology. Any transcriptional errors that result from this process are unintentional.

## 2017-02-11 NOTE — Patient Instructions (Signed)
1. Return on 02/26/2017 for postop check

## 2017-02-11 NOTE — H&P (Addendum)
Subjective:  PREOPERATIVE HISTORY AND PHYSICAL   Date of surgery: 02/16/2017 Procedure: Hysteroscopy/D&C Diagnoses: 1. Postmenopausal bleeding 2. Benign endometrial hyperplasia 3. Thickened endometrium on ultrasound 4. Endometrial polyp 5. Morbid obesity   Patient is a 66 y.o. G2P1092female scheduled for hysteroscopy/D&C on 02/16/2017 for evaluation and management of persistent postmenopausal bleeding despite Megace therapy. Preoperative endometrial biopsy demonstrated benign endometrial hyperplasia and endometrial polyp. Preoperative ultrasound had demonstrated a thickened endometrium.  The patient continues to have light to heavy abnormal uterine bleeding while on Megace 40 mg twice daily. Bleeding is 3 out of 4 weeks a month and she uses aprox 10 tampons a day on average. At the start of her cycle she endorses pain in the lower abdomen and lower back pain.   Ultrasound from 02/09/2017:  Indications:AUB Findings:  The uterus measures 8.5 x 5.8 x 6.4 cm. Echo texture is heterogenous with evidence of a focal mass. Within the uterus is a suspected fibroid measuring: Fibroid 1: Left fundal intramural 2.7 x 3.1 x 2.9 cm  The Endometrium measures 8 mm.  Right Ovary measures 2.1 x 1.4 x 1.2 cm. It is normal in appearance. Left Ovary measures 2.5 x 1.6 x 2.5 cm. It is normal appearance. Survey of the adnexa demonstrates no adnexal masses. There is no free fluid in the cul de sac.  Impression: 1. Fibroid uterus 2. Thickened Endometrium   Gynecologic History Patient's last menstrual period was 07/13/2016. Contraception: post menopausal status and tubal ligation Last Pap:? Last mammogram: ? Family history of breast cancer in daughter and mother Family history of ovarian cancer in mother  OB History    Gravida Para Term Preterm AB Living   2 1 1   1 1    SAB TAB Ectopic Multiple Live Births   1       1      Patient's last menstrual period was 01/26/2017.    Past  Medical History:  Diagnosis Date  . Abnormal Pap smear of cervix    Pt states she had colposcopy   . Hx of unilateral nephrectomy    Left Nephrectomy  . Hypertension   . PMB (postmenopausal bleeding)     Past Surgical History:  Procedure Laterality Date  . KIDNEY SURGERY  1979    OB History  Gravida Para Term Preterm AB Living  2 1 1   1 1   SAB TAB Ectopic Multiple Live Births  1       1    # Outcome Date GA Lbr Len/2nd Weight Sex Delivery Anes PTL Lv  2 Term 1979    F Vag-Spont   LIV  1 SAB               Social History   Social History  . Marital status: Divorced    Spouse name: N/A  . Number of children: N/A  . Years of education: N/A   Social History Main Topics  . Smoking status: Never Smoker  . Smokeless tobacco: Never Used  . Alcohol use Yes     Comment: occasionally  . Drug use: Yes    Frequency: 3.0 times per week    Types: Marijuana     Comment: Pt states she uses for leg pain   . Sexual activity: Yes    Birth control/ protection: None   Other Topics Concern  . None   Social History Narrative  . None    Family History  Problem Relation Age of Onset  . Ovarian cancer Mother   .  Hypertension Father   . Diabetes Father   . Breast cancer Sister      (Not in a hospital admission)  Allergies  Allergen Reactions  . Enalapril Swelling  . Lisinopril Swelling    Review of Systems Constitutional: No recent fever/chills/sweats Respiratory: No recent cough/bronchitis Cardiovascular: No chest pain Gastrointestinal: No recent nausea/vomiting/diarrhea Genitourinary: No UTI symptoms Hematologic/lymphatic:No history of coagulopathy or recent blood thinner use    Objective:    BP (!) 168/95   Pulse 73   Ht 5\' 5"  (1.651 m)   Wt 249 lb 3.2 oz (113 kg)   LMP 01/26/2017   BMI 41.47 kg/m   General:   Normal  Skin:   normal  HEENT:  Normal  Neck:  Supple without Adenopathy or Thyromegaly  Lungs:   Heart:              Breasts:   Abdomen:   Pelvis:  M/S   Extremeties:  Neuro:    clear to auscultation bilaterally   Normal without murmur   Not Examined   soft, non-tender; bowel sounds normal; no masses,  no organomegaly   Exam deferred to OR  No CVAT  Warm/Dry   Normal        01/27/2017 PELVIC:             External Genitalia: Normal             BUS: Normal             Vagina: Normal             Cervix: Normal, mobile, nontender; minimal blood at os             Uterus: Normal size, shape,consistency, mobile, midplane             Adnexa: Normal, nonpalpable, nontender              RV: Normal external exam; internal exam deferred              Bladder: Nontender  Assessment:    1. Postmenopausal bleeding 2. History of benign endometrial hyperplasia on endometrial biopsy 3. Endometrial polyp on endometrial biopsy 4. Thickened endometrium on ultrasound 5. Morbid obesity   Plan:  Hysteroscopy/D&C  Preop counseling: The patient is to undergo hysteroscopy/D&C for evaluation and management of persistent postmenopausal bleeding despite use of Megace therapy. She is understanding of the planned procedures and is aware of and is accepting of all surgical risks which include but are not limited to bleeding, infection, pelvic organ injury with need for repair, blood clot disorders, anesthesia risks, etc. All questions have been answered. Informed consent is given. Patient is ready and willing to proceed with surgery as scheduled.  Brayton Mars, MD  Note: This dictation was prepared with Dragon dictation along with smaller phrase technology. Any transcriptional errors that result from this process are unintentional.

## 2017-02-12 ENCOUNTER — Encounter
Admission: RE | Admit: 2017-02-12 | Discharge: 2017-02-12 | Disposition: A | Discharge status: Home / Self Care | Payer: Medicare Other | Source: Ambulatory Visit | Attending: Obstetrics and Gynecology | Admitting: Obstetrics and Gynecology

## 2017-02-12 ENCOUNTER — Ambulatory Visit: Payer: Medicare Other | Admitting: Physical Therapy

## 2017-02-12 DIAGNOSIS — Z803 Family history of malignant neoplasm of breast: Secondary | ICD-10-CM | POA: Insufficient documentation

## 2017-02-12 DIAGNOSIS — Z8041 Family history of malignant neoplasm of ovary: Secondary | ICD-10-CM | POA: Diagnosis not present

## 2017-02-12 DIAGNOSIS — R001 Bradycardia, unspecified: Secondary | ICD-10-CM | POA: Insufficient documentation

## 2017-02-12 DIAGNOSIS — I10 Essential (primary) hypertension: Secondary | ICD-10-CM | POA: Diagnosis not present

## 2017-02-12 DIAGNOSIS — N8501 Benign endometrial hyperplasia: Secondary | ICD-10-CM | POA: Diagnosis not present

## 2017-02-12 DIAGNOSIS — Z01818 Encounter for other preprocedural examination: Secondary | ICD-10-CM | POA: Insufficient documentation

## 2017-02-12 DIAGNOSIS — N950 Postmenopausal bleeding: Secondary | ICD-10-CM | POA: Insufficient documentation

## 2017-02-12 HISTORY — DX: Chronic kidney disease, unspecified: N18.9

## 2017-02-12 HISTORY — DX: Restless legs syndrome: G25.81

## 2017-02-12 HISTORY — DX: Unspecified osteoarthritis, unspecified site: M19.90

## 2017-02-12 LAB — CBC WITH DIFFERENTIAL/PLATELET
Basophils Absolute: 0 10*3/uL (ref 0–0.1)
Basophils Relative: 1 %
Eosinophils Absolute: 0.1 10*3/uL (ref 0–0.7)
Eosinophils Relative: 3 %
HCT: 39.4 % (ref 35.0–47.0)
Hemoglobin: 13.1 g/dL (ref 12.0–16.0)
Lymphocytes Relative: 32 %
Lymphs Abs: 1.7 10*3/uL (ref 1.0–3.6)
MCH: 30.1 pg (ref 26.0–34.0)
MCHC: 33.2 g/dL (ref 32.0–36.0)
MCV: 90.7 fL (ref 80.0–100.0)
Monocytes Absolute: 0.6 10*3/uL (ref 0.2–0.9)
Monocytes Relative: 10 %
Neutro Abs: 3 10*3/uL (ref 1.4–6.5)
Neutrophils Relative %: 54 %
Platelets: 207 10*3/uL (ref 150–440)
RBC: 4.34 MIL/uL (ref 3.80–5.20)
RDW: 13.1 % (ref 11.5–14.5)
WBC: 5.4 10*3/uL (ref 3.6–11.0)

## 2017-02-12 LAB — RAPID HIV SCREEN (HIV 1/2 AB+AG)
HIV 1/2 Antibodies: NONREACTIVE
HIV-1 P24 Antigen - HIV24: NONREACTIVE

## 2017-02-12 LAB — TYPE AND SCREEN
ABO/RH(D): B POS
Antibody Screen: NEGATIVE

## 2017-02-12 NOTE — Pre-Procedure Instructions (Signed)
Dr. Kayleen Memos notified of pre-op EKG and requested PCP review EKG for surgery scheduled for Monday (July 16 ).

## 2017-02-12 NOTE — Pre-Procedure Instructions (Signed)
Dr. Clayborn Bigness office called and faxed pre-op EKG to review .

## 2017-02-12 NOTE — Patient Instructions (Signed)
  Your procedure is scheduled on: February 16, 2017 [facility]) Report to Same Day Surgery 2nd floor medical mall ([place] Entrance-take elevator on left to 2nd floor.  Check in with surgery information desk.) ARRIVAL TIME 1:00 pm   Remember: Instructions that are not followed completely may result in serious medical risk, up to and including death, or upon the discretion of your surgeon and anesthesiologist your surgery may need to be rescheduled.    _x___ 1. Do not eat food or drink liquids after midnight. No gum chewing or hard candies                                 __x__ 2. No Alcohol for 24 hours before or after surgery.   __x__3. No Smoking for 24 prior to surgery.   ____  4. Bring all medications with you on the day of surgery if instructed.    __x__ 5. Notify your doctor if there is any change in your medical condition     (cold, fever, infections).     Do not wear jewelry, make-up, hairpins, clips or nail polish.  Do not wear lotions, powders, or perfumes. You may wear deodorant.  Do not shave 48 hours prior to surgery. Men may shave face and neck.  Do not bring valuables to the hospital.    [facility] is not responsible for any belongings or valuables.               Contacts, dentures or bridgework may not be worn into surgery.  Leave your suitcase in the car. After surgery it may be brought to your room.  For patients admitted to the hospital, discharge time is determined by your  treatment team                       Patients discharged the day of surgery will not be allowed to drive home.  You will need someone to drive you home and stay with you the night of your procedure.    Please read over the following fact sheets that you were given:   [facility] Preparing for Surgery and or MRSA Information   _x___ Take the following medications the morning of surgery with a sip of water :  1. AMLODIPINE  2. ATORVASTATIN  3. FAMOTIDINE  4. GABAPENTIN  5.  HydrALAZINE  6.  ____Fleets enema or Magnesium Citrate as directed.   ____ Use CHG Soap or sage wipes as directed on instruction sheet   ____ Use inhalers on the day of surgery and bring to hospital day of surgery  ____ Stop Metformin and Janumet 2 days prior to surgery.    ____ Take 1/2 of usual insulin dose the night before surgery and none on the morning surgery      _x___ Follow recommendations from Cardiologist, Pulmonologist or PCP regarding          stopping Aspirin, Coumadin, Plavix ,Eliquis, Effient, or Pradaxa, and Pletal.  X____Stop Anti-inflammatories such as Advil, Aleve, Ibuprofen, Motrin, Naproxen, Naprosyn, Goodies powders or aspirin products. OK to take Tylenol    _x___ Stop supplements until after surgery.  But may continue Vitamin D, Vitamin B, and multivitamin         ____ Bring C-Pap to the hospital.

## 2017-02-13 LAB — RPR: RPR Ser Ql: NONREACTIVE

## 2017-02-16 ENCOUNTER — Ambulatory Visit: Payer: Medicare Other | Admitting: Anesthesiology

## 2017-02-16 ENCOUNTER — Encounter: Payer: Self-pay | Admitting: *Deleted

## 2017-02-16 ENCOUNTER — Encounter
Admission: RE | Disposition: A | Discharge status: Home / Self Care | Payer: Self-pay | Source: Ambulatory Visit | Attending: Obstetrics and Gynecology

## 2017-02-16 ENCOUNTER — Ambulatory Visit
Admission: RE | Admit: 2017-02-16 | Discharge: 2017-02-16 | Disposition: A | Discharge status: Home / Self Care | Payer: Medicare Other | Source: Ambulatory Visit | Attending: Obstetrics and Gynecology | Admitting: Obstetrics and Gynecology

## 2017-02-16 DIAGNOSIS — Z8041 Family history of malignant neoplasm of ovary: Secondary | ICD-10-CM | POA: Insufficient documentation

## 2017-02-16 DIAGNOSIS — Z803 Family history of malignant neoplasm of breast: Secondary | ICD-10-CM | POA: Insufficient documentation

## 2017-02-16 DIAGNOSIS — I10 Essential (primary) hypertension: Secondary | ICD-10-CM | POA: Insufficient documentation

## 2017-02-16 DIAGNOSIS — N840 Polyp of corpus uteri: Secondary | ICD-10-CM | POA: Diagnosis not present

## 2017-02-16 DIAGNOSIS — Z6841 Body Mass Index (BMI) 40.0 and over, adult: Secondary | ICD-10-CM | POA: Diagnosis not present

## 2017-02-16 DIAGNOSIS — Z79899 Other long term (current) drug therapy: Secondary | ICD-10-CM | POA: Insufficient documentation

## 2017-02-16 DIAGNOSIS — Z905 Acquired absence of kidney: Secondary | ICD-10-CM | POA: Insufficient documentation

## 2017-02-16 DIAGNOSIS — Z9889 Other specified postprocedural states: Secondary | ICD-10-CM

## 2017-02-16 DIAGNOSIS — N950 Postmenopausal bleeding: Secondary | ICD-10-CM | POA: Insufficient documentation

## 2017-02-16 DIAGNOSIS — F1290 Cannabis use, unspecified, uncomplicated: Secondary | ICD-10-CM | POA: Diagnosis not present

## 2017-02-16 HISTORY — PX: HYSTEROSCOPY WITH D & C: SHX1775

## 2017-02-16 LAB — URINE DRUG SCREEN, QUALITATIVE (ARMC ONLY)
Amphetamines, Ur Screen: NOT DETECTED
Barbiturates, Ur Screen: NOT DETECTED
Benzodiazepine, Ur Scrn: NOT DETECTED
Cannabinoid 50 Ng, Ur ~~LOC~~: POSITIVE — AB
Cocaine Metabolite,Ur ~~LOC~~: POSITIVE — AB
MDMA (Ecstasy)Ur Screen: NOT DETECTED
Methadone Scn, Ur: NOT DETECTED
Opiate, Ur Screen: NOT DETECTED
Phencyclidine (PCP) Ur S: NOT DETECTED
Tricyclic, Ur Screen: NOT DETECTED

## 2017-02-16 LAB — ABO/RH: ABO/RH(D): B POS

## 2017-02-16 SURGERY — DILATATION AND CURETTAGE /HYSTEROSCOPY
Anesthesia: General | Wound class: Clean Contaminated

## 2017-02-16 MED ORDER — IBUPROFEN 800 MG PO TABS: 800.0000 mg | ORAL_TABLET | Freq: Three times a day (TID) | ORAL | 1 refills | Status: DC

## 2017-02-16 MED ORDER — PROPOFOL 500 MG/50ML IV EMUL
INTRAVENOUS | Status: DC | PRN
  Administered 2017-02-16: 25 ug/kg/min via INTRAVENOUS

## 2017-02-16 MED ORDER — PHENYLEPHRINE HCL 10 MG/ML IJ SOLN
INTRAMUSCULAR | Status: DC | PRN
  Administered 2017-02-16: 100 ug via INTRAVENOUS
  Administered 2017-02-16 (×2): 200 ug via INTRAVENOUS

## 2017-02-16 MED ORDER — ONDANSETRON HCL 4 MG/2ML IJ SOLN
INTRAMUSCULAR | Status: AC
  Filled 2017-02-16: qty 2

## 2017-02-16 MED ORDER — LIDOCAINE HCL (CARDIAC) 20 MG/ML IV SOLN
INTRAVENOUS | Status: DC | PRN
  Administered 2017-02-16: 80 mg via INTRAVENOUS

## 2017-02-16 MED ORDER — OXYCODONE-ACETAMINOPHEN 5-325 MG PO TABS: 1.0000 | ORAL_TABLET | ORAL | 0 refills | Status: DC | PRN

## 2017-02-16 MED ORDER — SUGAMMADEX SODIUM 200 MG/2ML IV SOLN: INTRAVENOUS | Status: DC | PRN

## 2017-02-16 MED ORDER — ONDANSETRON HCL 4 MG/2ML IJ SOLN
INTRAMUSCULAR | Status: DC | PRN
  Administered 2017-02-16: 4 mg via INTRAVENOUS

## 2017-02-16 MED ORDER — DEXAMETHASONE SODIUM PHOSPHATE 10 MG/ML IJ SOLN
INTRAMUSCULAR | Status: DC | PRN
  Administered 2017-02-16: 10 mg via INTRAVENOUS

## 2017-02-16 MED ORDER — DEXAMETHASONE SODIUM PHOSPHATE 10 MG/ML IJ SOLN
INTRAMUSCULAR | Status: AC
  Filled 2017-02-16: qty 1

## 2017-02-16 MED ORDER — ONDANSETRON HCL 4 MG/2ML IJ SOLN: 4.0000 mg | Freq: Once | INTRAMUSCULAR | Status: DC | PRN

## 2017-02-16 MED ORDER — PROPOFOL 10 MG/ML IV BOLUS
INTRAVENOUS | Status: AC
  Filled 2017-02-16: qty 20

## 2017-02-16 MED ORDER — PROPOFOL 10 MG/ML IV BOLUS
INTRAVENOUS | Status: AC
  Filled 2017-02-16: qty 40

## 2017-02-16 MED ORDER — MIDAZOLAM HCL 2 MG/2ML IJ SOLN
INTRAMUSCULAR | Status: AC
  Filled 2017-02-16: qty 2

## 2017-02-16 MED ORDER — LACTATED RINGERS IV SOLN
INTRAVENOUS | Status: DC
  Administered 2017-02-16: 15:00:00 via INTRAVENOUS

## 2017-02-16 MED ORDER — FENTANYL CITRATE (PF) 100 MCG/2ML IJ SOLN
INTRAMUSCULAR | Status: AC
  Filled 2017-02-16: qty 2

## 2017-02-16 MED ORDER — KETOROLAC TROMETHAMINE 30 MG/ML IJ SOLN
INTRAMUSCULAR | Status: DC | PRN
  Administered 2017-02-16: 15 mg via INTRAVENOUS

## 2017-02-16 MED ORDER — FENTANYL CITRATE (PF) 100 MCG/2ML IJ SOLN
25.0000 ug | INTRAMUSCULAR | Status: DC | PRN
  Administered 2017-02-16 (×3): 25 ug via INTRAVENOUS

## 2017-02-16 MED ORDER — LIDOCAINE HCL (PF) 2 % IJ SOLN
INTRAMUSCULAR | Status: AC
Start: 2017-02-16 — End: 2017-02-16
  Filled 2017-02-16: qty 2

## 2017-02-16 MED ORDER — PROPOFOL 10 MG/ML IV BOLUS
INTRAVENOUS | Status: DC | PRN
  Administered 2017-02-16: 20 mg via INTRAVENOUS
  Administered 2017-02-16: 180 mg via INTRAVENOUS

## 2017-02-16 MED ORDER — FENTANYL CITRATE (PF) 100 MCG/2ML IJ SOLN
INTRAMUSCULAR | Status: DC | PRN
  Administered 2017-02-16 (×2): 50 ug via INTRAVENOUS

## 2017-02-16 MED ORDER — MIDAZOLAM HCL 2 MG/2ML IJ SOLN
INTRAMUSCULAR | Status: DC | PRN
  Administered 2017-02-16: 2 mg via INTRAVENOUS

## 2017-02-16 MED ORDER — SUCCINYLCHOLINE CHLORIDE 20 MG/ML IJ SOLN
INTRAMUSCULAR | Status: DC | PRN
  Administered 2017-02-16: 200 mg via INTRAVENOUS

## 2017-02-16 MED ORDER — FENTANYL CITRATE (PF) 100 MCG/2ML IJ SOLN
INTRAMUSCULAR | Status: AC
  Administered 2017-02-16: 25 ug via INTRAVENOUS
  Filled 2017-02-16: qty 2

## 2017-02-16 SURGICAL SUPPLY — 12 items
BAG INFUSER PRESSURE 100CC (MISCELLANEOUS) ×2 IMPLANT
CATH ROBINSON RED A/P 16FR (CATHETERS) ×2 IMPLANT
GLOVE BIO SURGEON STRL SZ8 (GLOVE) ×6 IMPLANT
GOWN STRL REUS W/ TWL LRG LVL3 (GOWN DISPOSABLE) ×2 IMPLANT
GOWN STRL REUS W/ TWL XL LVL3 (GOWN DISPOSABLE) ×1 IMPLANT
GOWN STRL REUS W/TWL LRG LVL3 (GOWN DISPOSABLE) ×2
GOWN STRL REUS W/TWL XL LVL3 (GOWN DISPOSABLE) ×1
IV LACTATED RINGERS 1000ML (IV SOLUTION) ×2 IMPLANT
KIT RM TURNOVER CYSTO AR (KITS) ×2 IMPLANT
PACK DNC HYST (MISCELLANEOUS) ×2 IMPLANT
PAD OB MATERNITY 4.3X12.25 (PERSONAL CARE ITEMS) ×2 IMPLANT
PAD PREP 24X41 OB/GYN DISP (PERSONAL CARE ITEMS) ×2 IMPLANT

## 2017-02-16 NOTE — Anesthesia Procedure Notes (Signed)
Procedure Name: Intubation Date/Time: 02/16/2017 3:36 PM Performed by: Johnna Acosta Pre-anesthesia Checklist: Patient identified, Emergency Drugs available, Suction available, Patient being monitored and Timeout performed Patient Re-evaluated:Patient Re-evaluated prior to induction Oxygen Delivery Method: Circle system utilized Preoxygenation: Pre-oxygenation with 100% oxygen Induction Type: IV induction Ventilation: Mask ventilation without difficulty Laryngoscope Size: Miller and 2 Grade View: Grade I Tube type: Oral Tube size: 7.5 mm Number of attempts: 1 Airway Equipment and Method: Stylet Placement Confirmation: ETT inserted through vocal cords under direct vision,  positive ETCO2 and breath sounds checked- equal and bilateral Secured at: 21 cm Tube secured with: Tape Dental Injury: Teeth and Oropharynx as per pre-operative assessment

## 2017-02-16 NOTE — Interval H&P Note (Signed)
History and Physical Interval Note:  02/16/2017 3:09 PM  Whitney [name]  has presented today for surgery, with the diagnosis of PMB, BENIGN ENDOMETRIAL HYPERPLASIA, F/H OF OVARIAN CANCER, F/H OF BREAST CANCER IN FIRST DEGREE RELATIVE  The various methods of treatment have been discussed with the patient and family. After consideration of risks, benefits and other options for treatment, the patient has consented to  Procedure(s): DILATATION AND CURETTAGE /HYSTEROSCOPY (N/A) as a surgical intervention .  The patient's history has been reviewed, patient examined, no change in status, stable for surgery.  I have reviewed the patient's chart and labs.  Questions were answered to the patient's satisfaction.     Hassell Done A Doryce Mcgregory

## 2017-02-16 NOTE — Anesthesia Postprocedure Evaluation (Signed)
Anesthesia Post Note  Patient: Whitney [name]  Procedure(s) Performed: Procedure(s) (LRB): DILATATION AND CURETTAGE /HYSTEROSCOPY (N/A)  Patient location during evaluation: PACU Anesthesia Type: General Level of consciousness: awake and alert Pain management: pain level controlled Vital Signs Assessment: post-procedure vital signs reviewed and stable Respiratory status: spontaneous breathing, nonlabored ventilation, respiratory function stable and patient connected to nasal cannula oxygen Cardiovascular status: blood pressure returned to baseline and stable Postop Assessment: no signs of nausea or vomiting Anesthetic complications: no     Last Vitals:  Vitals:   02/16/17 1728 02/16/17 1737  BP: (!) 186/72 (!) 166/69  Pulse: 62 67  Resp: 18 18  Temp: (!) 36.3 C     Last Pain:  Vitals:   02/16/17 1737  TempSrc:   PainSc: 0-No pain                 Precious Haws Javeion Cannedy

## 2017-02-16 NOTE — Op Note (Signed)
OPERATIVE NOTE  Date of surgery: 02/16/2017  Preoperative diagnosis:  1. Postmenopausal bleeding 2. History of benign endometrial hyperplasia 3. Endometrial polyps  Postoperative diagnosis:  1. Postmenopausal bleeding 2. History of benign endometrial hyperplasia 3. Endometrial polyps  Operative procedure: 1. Hysteroscopy 2. Dilation and curettage of endometrium  Surgeon: Dr. Zipporah Plants Assistant: Carlene Coria, PA-S Anesthesia: Gen.   Findings: Pelvis: Gynecoid Uterus: sounded to 9 cm Normal appearing endometrial cavity evidence of probable endometrial polyps and possible small submucous fibroid in the right cornu  Description of procedure: Patient was brought to the operating room where she was placed in supine position. General anesthesia was induced without difficulty using the LMA technique. The patient was placed in dorsal lithotomy position using candy cane stirrups. A ChloraPrep and Hibiclens  perineal intravaginal prep and drape was performed in standard fashion. Weighted speculum was placed in the vagina. Single-tooth tenaculum was placed on the anterior cervix. Uterus was sounded to 9 cm. Hanks dilators were used up to a #20 [place] caliber to dilate the endocervical canal. The myosure hysteroscope using lactated Ringer's as irrigant was used for the hysteroscopy. The above-noted findings were photo documented. The hysteroscope removed. The smooth and serrated curettes were then used to curettage the endometrial cavity with production of average tissue. Stone polyp forceps were used to grasp residual tissue left behind. Repeat hysteroscopy was performed and demonstrated excellent sampling. Procedure was then terminated with all instrumentation being removed from the vagina. The patient was then awakened mobilized and taken to the recovery room in satisfactory condition.  IV fluids: See flow sheet Urine output: 150 mL. EBL: Less than 20 mL.  Instruments, needles, and  sponge counts were verified as correct.  Brayton Mars, MD 02/16/2017

## 2017-02-16 NOTE — Anesthesia Preprocedure Evaluation (Signed)
Anesthesia Evaluation  Patient identified by MRN, date of birth, ID band Patient awake    Reviewed: Allergy & Precautions, NPO status , Patient's Chart, lab work & pertinent test results  Airway Mallampati: III       Dental  (+) Upper Dentures   Pulmonary neg pulmonary ROS,     + decreased breath sounds      Cardiovascular Exercise Tolerance: Good hypertension, Pt. on medications and Pt. on home beta blockers  Rhythm:Regular     Neuro/Psych negative neurological ROS  negative psych ROS   GI/Hepatic negative GI ROS, Neg liver ROS,   Endo/Other  Morbid obesity  Renal/GU Renal diseaseS/p nephrectomy     Musculoskeletal   Abdominal (+) + obese,   Peds negative pediatric ROS (+)  Hematology negative hematology ROS (+)   Anesthesia Other Findings   Reproductive/Obstetrics                             Anesthesia Physical Anesthesia Plan  ASA: III  Anesthesia Plan: General   Post-op Pain Management:    Induction: Intravenous  PONV Risk Score and Plan:   Airway Management Planned: Oral ETT  Additional Equipment:   Intra-op Plan:   Post-operative Plan: Extubation in OR  Informed Consent: I have reviewed the patients History and Physical, chart, labs and discussed the procedure including the risks, benefits and alternatives for the proposed anesthesia with the patient or authorized representative who has indicated his/her understanding and acceptance.     Plan Discussed with: CRNA  Anesthesia Plan Comments:         Anesthesia Quick Evaluation

## 2017-02-16 NOTE — Transfer of Care (Signed)
Immediate Anesthesia Transfer of Care Note  Patient: Whitney [name]  Procedure(s) Performed: Procedure(s): DILATATION AND CURETTAGE /HYSTEROSCOPY (N/A)  Patient Location: PACU  Anesthesia Type:General  Level of Consciousness: awake and alert   Airway & Oxygen Therapy: Patient Spontanous Breathing and Patient connected to face mask oxygen  Post-op Assessment: Report given to RN and Post -op Vital signs reviewed and stable  Post vital signs: Reviewed and stable  Last Vitals:  Vitals:   02/16/17 1415  BP: (!) 162/91  Pulse: 68  Resp: 18  Temp: 37.1 C    Last Pain:  Vitals:   02/16/17 1415  TempSrc: Oral         Complications: No apparent anesthesia complications

## 2017-02-16 NOTE — Discharge Instructions (Signed)

## 2017-02-16 NOTE — Anesthesia Post-op Follow-up Note (Cosign Needed)
Anesthesia QCDR form completed.        

## 2017-02-16 NOTE — H&P (View-Only) (Signed)
Subjective:  PREOPERATIVE HISTORY AND PHYSICAL   Date of surgery: 02/16/2017 Procedure: Hysteroscopy/D&C Diagnoses: 1. Postmenopausal bleeding 2. Benign endometrial hyperplasia 3. Thickened endometrium on ultrasound 4. Endometrial polyp 5. Morbid obesity   Patient is a 66 y.o. G2P1031female scheduled for hysteroscopy/D&C on 02/16/2017 for evaluation and management of persistent postmenopausal bleeding despite Megace therapy. Preoperative endometrial biopsy demonstrated benign endometrial hyperplasia and endometrial polyp. Preoperative ultrasound had demonstrated a thickened endometrium.  The patient continues to have light to heavy abnormal uterine bleeding while on Megace 40 mg twice daily. Bleeding is 3 out of 4 weeks a month and she uses aprox 10 tampons a day on average. At the start of her cycle she endorses pain in the lower abdomen and lower back pain.   Ultrasound from 02/09/2017:  Indications:AUB Findings:  The uterus measures 8.5 x 5.8 x 6.4 cm. Echo texture is heterogenous with evidence of a focal mass. Within the uterus is a suspected fibroid measuring: Fibroid 1: Left fundal intramural 2.7 x 3.1 x 2.9 cm  The Endometrium measures 8 mm.  Right Ovary measures 2.1 x 1.4 x 1.2 cm. It is normal in appearance. Left Ovary measures 2.5 x 1.6 x 2.5 cm. It is normal appearance. Survey of the adnexa demonstrates no adnexal masses. There is no free fluid in the cul de sac.  Impression: 1. Fibroid uterus 2. Thickened Endometrium   Gynecologic History Patient's last menstrual period was 07/13/2016. Contraception: post menopausal status and tubal ligation Last Pap:? Last mammogram: ? Family history of breast cancer in daughter and mother Family history of ovarian cancer in mother  OB History    Gravida Para Term Preterm AB Living   2 1 1   1 1    SAB TAB Ectopic Multiple Live Births   1       1      Patient's last menstrual period was 01/26/2017.    Past  Medical History:  Diagnosis Date  . Abnormal Pap smear of cervix    Pt states she had colposcopy   . Hx of unilateral nephrectomy    Left Nephrectomy  . Hypertension   . PMB (postmenopausal bleeding)     Past Surgical History:  Procedure Laterality Date  . KIDNEY SURGERY  1979    OB History  Gravida Para Term Preterm AB Living  2 1 1   1 1   SAB TAB Ectopic Multiple Live Births  1       1    # Outcome Date GA Lbr Len/2nd Weight Sex Delivery Anes PTL Lv  2 Term 1979    F Vag-Spont   LIV  1 SAB               Social History   Social History  . Marital status: Divorced    Spouse name: N/A  . Number of children: N/A  . Years of education: N/A   Social History Main Topics  . Smoking status: Never Smoker  . Smokeless tobacco: Never Used  . Alcohol use Yes     Comment: occasionally  . Drug use: Yes    Frequency: 3.0 times per week    Types: Marijuana     Comment: Pt states she uses for leg pain   . Sexual activity: Yes    Birth control/ protection: None   Other Topics Concern  . None   Social History Narrative  . None    Family History  Problem Relation Age of Onset  . Ovarian cancer Mother   .  Hypertension Father   . Diabetes Father   . Breast cancer Sister      (Not in a hospital admission)  Allergies  Allergen Reactions  . Enalapril Swelling  . Lisinopril Swelling    Review of Systems Constitutional: No recent fever/chills/sweats Respiratory: No recent cough/bronchitis Cardiovascular: No chest pain Gastrointestinal: No recent nausea/vomiting/diarrhea Genitourinary: No UTI symptoms Hematologic/lymphatic:No history of coagulopathy or recent blood thinner use    Objective:    BP (!) 168/95   Pulse 73   Ht 5\' 5"  (1.651 m)   Wt 249 lb 3.2 oz (113 kg)   LMP 01/26/2017   BMI 41.47 kg/m   General:   Normal  Skin:   normal  HEENT:  Normal  Neck:  Supple without Adenopathy or Thyromegaly  Lungs:   Heart:              Breasts:   Abdomen:   Pelvis:  M/S   Extremeties:  Neuro:    clear to auscultation bilaterally   Normal without murmur   Not Examined   soft, non-tender; bowel sounds normal; no masses,  no organomegaly   Exam deferred to OR  No CVAT  Warm/Dry   Normal        01/27/2017 PELVIC:             External Genitalia: Normal             BUS: Normal             Vagina: Normal             Cervix: Normal, mobile, nontender; minimal blood at os             Uterus: Normal size, shape,consistency, mobile, midplane             Adnexa: Normal, nonpalpable, nontender              RV: Normal external exam; internal exam deferred              Bladder: Nontender  Assessment:    1. Postmenopausal bleeding 2. History of benign endometrial hyperplasia on endometrial biopsy 3. Endometrial polyp on endometrial biopsy 4. Thickened endometrium on ultrasound 5. Morbid obesity   Plan:  Hysteroscopy/D&C  Preop counseling: The patient is to undergo hysteroscopy/D&C for evaluation and management of persistent postmenopausal bleeding despite use of Megace therapy. She is understanding of the planned procedures and is aware of and is accepting of all surgical risks which include but are not limited to bleeding, infection, pelvic organ injury with need for repair, blood clot disorders, anesthesia risks, etc. All questions have been answered. Informed consent is given. Patient is ready and willing to proceed with surgery as scheduled.  Brayton Mars, MD  Note: This dictation was prepared with Dragon dictation along with smaller phrase technology. Any transcriptional errors that result from this process are unintentional.

## 2017-02-18 LAB — SURGICAL PATHOLOGY

## 2017-02-25 ENCOUNTER — Encounter: Payer: Self-pay | Admitting: Obstetrics and Gynecology

## 2017-02-25 ENCOUNTER — Ambulatory Visit (INDEPENDENT_AMBULATORY_CARE_PROVIDER_SITE_OTHER): Payer: Medicare Other | Admitting: Obstetrics and Gynecology

## 2017-02-25 VITALS — BP 134/79 | HR 69 | Ht 63.0 in | Wt 251.6 lb

## 2017-02-25 DIAGNOSIS — N8501 Benign endometrial hyperplasia: Secondary | ICD-10-CM

## 2017-02-25 DIAGNOSIS — N840 Polyp of corpus uteri: Secondary | ICD-10-CM | POA: Diagnosis not present

## 2017-02-25 DIAGNOSIS — Z09 Encounter for follow-up examination after completed treatment for conditions other than malignant neoplasm: Secondary | ICD-10-CM | POA: Diagnosis not present

## 2017-02-25 DIAGNOSIS — N950 Postmenopausal bleeding: Secondary | ICD-10-CM

## 2017-02-25 NOTE — Progress Notes (Addendum)
Chief complaint: 1. One week postop check 2. History of postmenopausal bleeding 3. History of benign endometrial hyperplasia, unresponsive to progestin therapy 4. History of thickened endometrium on ultrasound  Patient presents for postop check 10 days post surgery. Hysteroscopy/D&C was performed because of uncontrolled post menopausal bleeding despite progestin therapy; endometrial biopsy preoperatively showed benign endometrial hyperplasia and fragments of polyps.  Patient has not had any significant bleeding since surgery.  Pathology:  DIAGNOSIS:  A. ENDOMETRIUM; CURETTAGE:  - ENDOMETRIAL POLYP FRAGMENTS.  - PROGESTIN EFFECT.  - FRAGMENTS OF BENIGN SQUAMOUS AND ENDOCERVICAL EPITHELIUM.  - NEGATIVE FOR ATYPIA AND MALIGNANCY.     OBJECTIVE: BP 134/79   Pulse 69   Ht 5\' 3"  (1.6 m)   Wt 251 lb 9.6 oz (114.1 kg)   LMP 01/26/2017   BMI 44.57 kg/m  Physical exam-deferred  ASSESSMENT: 1. Normal postop check 1 week status post hysteroscopy/D&C for postmenopausal bleeding and endometrial polyps 2. Pathology demonstrates endometrial polyps without hyperplasia or carcinoma  PLAN: 1. Maintain menstrual calendar monitoring of any abnormal uterine bleeding 2. Discontinue medroxyprogesterone acetate 3. Return in 6 months for follow-up 4. Patient is to avoid nonsteroidal medication because of chronic renal disease  Brayton Mars, MD

## 2017-02-25 NOTE — Patient Instructions (Signed)
1. Discontinue medroxyprogesterone acetate at this time 2. Maintain menstrual calendar monitoring over the next 6 months 3. Return in 6 months for follow-up

## 2017-03-26 ENCOUNTER — Other Ambulatory Visit: Payer: Self-pay | Admitting: Internal Medicine

## 2017-03-26 DIAGNOSIS — N6489 Other specified disorders of breast: Secondary | ICD-10-CM

## 2017-04-10 ENCOUNTER — Other Ambulatory Visit: Payer: Self-pay | Admitting: Orthopedic Surgery

## 2017-04-10 DIAGNOSIS — M1712 Unilateral primary osteoarthritis, left knee: Secondary | ICD-10-CM

## 2017-04-20 ENCOUNTER — Ambulatory Visit
Admission: RE | Admit: 2017-04-20 | Discharge: 2017-04-20 | Disposition: A | Discharge status: Home / Self Care | Payer: Medicare Other | Source: Ambulatory Visit | Attending: Orthopedic Surgery | Admitting: Orthopedic Surgery

## 2017-04-20 DIAGNOSIS — M1712 Unilateral primary osteoarthritis, left knee: Secondary | ICD-10-CM | POA: Insufficient documentation

## 2017-06-02 ENCOUNTER — Ambulatory Visit: Payer: Self-pay | Admitting: Orthopedic Surgery

## 2017-06-17 ENCOUNTER — Other Ambulatory Visit: Payer: Self-pay

## 2017-06-17 ENCOUNTER — Encounter
Admission: RE | Admit: 2017-06-17 | Discharge: 2017-06-17 | Disposition: A | Discharge status: Home / Self Care | Payer: Medicare Other | Source: Ambulatory Visit | Attending: Orthopedic Surgery | Admitting: Orthopedic Surgery

## 2017-06-17 DIAGNOSIS — I10 Essential (primary) hypertension: Secondary | ICD-10-CM | POA: Insufficient documentation

## 2017-06-17 DIAGNOSIS — Z01812 Encounter for preprocedural laboratory examination: Secondary | ICD-10-CM | POA: Diagnosis not present

## 2017-06-17 DIAGNOSIS — R9431 Abnormal electrocardiogram [ECG] [EKG]: Secondary | ICD-10-CM | POA: Diagnosis not present

## 2017-06-17 DIAGNOSIS — Z01810 Encounter for preprocedural cardiovascular examination: Secondary | ICD-10-CM | POA: Insufficient documentation

## 2017-06-17 HISTORY — DX: Vitamin D deficiency, unspecified: E55.9

## 2017-06-17 LAB — URINALYSIS, COMPLETE (UACMP) WITH MICROSCOPIC
Bilirubin Urine: NEGATIVE
Glucose, UA: NEGATIVE mg/dL
Hgb urine dipstick: NEGATIVE
Ketones, ur: NEGATIVE mg/dL
Nitrite: NEGATIVE
Protein, ur: 100 mg/dL — AB
Specific Gravity, Urine: 1.012 (ref 1.005–1.030)
pH: 6 (ref 5.0–8.0)

## 2017-06-17 LAB — CBC
HCT: 41.9 % (ref 35.0–47.0)
Hemoglobin: 13.7 g/dL (ref 12.0–16.0)
MCH: 29.6 pg (ref 26.0–34.0)
MCHC: 32.6 g/dL (ref 32.0–36.0)
MCV: 90.8 fL (ref 80.0–100.0)
Platelets: 207 10*3/uL (ref 150–440)
RBC: 4.61 MIL/uL (ref 3.80–5.20)
RDW: 13.6 % (ref 11.5–14.5)
WBC: 5.4 10*3/uL (ref 3.6–11.0)

## 2017-06-17 LAB — BASIC METABOLIC PANEL
Anion gap: 8 (ref 5–15)
BUN: 12 mg/dL (ref 6–20)
CO2: 26 mmol/L (ref 22–32)
Calcium: 8.7 mg/dL — ABNORMAL LOW (ref 8.9–10.3)
Chloride: 105 mmol/L (ref 101–111)
Creatinine, Ser: 1.15 mg/dL — ABNORMAL HIGH (ref 0.44–1.00)
GFR calc Af Amer: 56 mL/min — ABNORMAL LOW (ref >60)
GFR calc non Af Amer: 48 mL/min — ABNORMAL LOW (ref >60)
Glucose, Bld: 75 mg/dL (ref 65–99)
Potassium: 4.1 mmol/L (ref 3.5–5.1)
Sodium: 139 mmol/L (ref 135–145)

## 2017-06-17 LAB — SURGICAL PCR SCREEN
MRSA, PCR: NEGATIVE
Staphylococcus aureus: NEGATIVE

## 2017-06-17 NOTE — Pre-Procedure Instructions (Signed)
EKG COMPARED WITH 02/12/17

## 2017-06-17 NOTE — Patient Instructions (Signed)
Your procedure is scheduled on: Monday, June 29, 2017 Report to Same Day Surgery on the 2nd floor in the [place]. To find out your arrival time, please call [phone] between 1PM - 3PM on: Friday, June 26, 2017  REMEMBER: Instructions that are not followed completely may result in serious medical risk, up to and including death; or upon the discretion of your surgeon and anesthesiologist your surgery may need to be rescheduled.  Do not eat food after midnight the night before your procedure.  No gum chewing or hard candies.  You may however, drink CLEAR liquids up to 2 hours before you are scheduled to arrive at the hospital for your procedure.  Do not drink clear liquids within 2 hours of the start of your surgery.  Clear liquids include: - water  - apple juice without pulp - clear gatorade - black coffee or tea (Do NOT add anything to the coffee or tea) Do NOT drink anything that is not on this list.  No Alcohol for 24 hours before or after surgery.  No Smoking including e-cigarettes for 24 hours prior to surgery. No chewable tobacco products for at least 6 hours prior to surgery. No nicotine patches on the day of surgery.  Notify your doctor if there is any change in your medical condition (cold, fever, infection).  Do not wear jewelry, make-up, hairpins, clips or nail polish.  Do not wear lotions, powders, or perfumes. You may wear deodorant.  Do not shave 48 hours prior to surgery.   Contacts and dentures may not be worn into surgery.  Do not bring valuables to the hospital. [facility] is not responsible for any belongings or valuables.   TAKE THESE MEDICATIONS THE MORNING OF SURGERY WITH A SIP OF WATER:  1.  Amlodipine 2.  Carvedilol 3.  Famotidine (Pepcid) 4.  Hydralazine 5.  Tramadol (if needed for pain)  Use CHG Soap as directed on instruction sheet.  Starting November 18:  Stop Anti-inflammatories such as Advil, Aleve, Ibuprofen, Motrin,  Naproxen, Naprosyn, Goodie powder, or aspirin products. (May take Tylenol or Acetaminophen and if needed.)  Starting November 18:  Stop supplements until after surgery. (May continue Vitamin D.)  If you are being admitted to the hospital overnight, leave your suitcase in the car. After surgery it may be brought to your room.  If you are taking public transportation, you will need to have a responsible adult to with you.  Please call the number above if you have any questions about these instructions.

## 2017-06-18 ENCOUNTER — Ambulatory Visit: Payer: Self-pay | Admitting: Orthopedic Surgery

## 2017-06-18 LAB — TYPE AND SCREEN
ABO/RH(D): B POS
Antibody Screen: NEGATIVE

## 2017-06-19 NOTE — Pre-Procedure Instructions (Signed)
UA sent to Dr. Harlow Mares for review.  Also asked for order "thigh high teds" to be placed in pre-op orders.

## 2017-06-23 ENCOUNTER — Encounter: Payer: Self-pay | Admitting: Obstetrics and Gynecology

## 2017-06-23 ENCOUNTER — Ambulatory Visit (INDEPENDENT_AMBULATORY_CARE_PROVIDER_SITE_OTHER): Payer: Medicare Other | Admitting: Obstetrics and Gynecology

## 2017-06-23 VITALS — BP 165/72 | HR 48 | Ht 65.0 in | Wt 249.1 lb

## 2017-06-23 DIAGNOSIS — N898 Other specified noninflammatory disorders of vagina: Secondary | ICD-10-CM

## 2017-06-23 DIAGNOSIS — B373 Candidiasis of vulva and vagina: Secondary | ICD-10-CM | POA: Diagnosis not present

## 2017-06-23 DIAGNOSIS — B3731 Acute candidiasis of vulva and vagina: Secondary | ICD-10-CM

## 2017-06-23 DIAGNOSIS — N939 Abnormal uterine and vaginal bleeding, unspecified: Secondary | ICD-10-CM

## 2017-06-23 DIAGNOSIS — Z6841 Body Mass Index (BMI) 40.0 and over, adult: Secondary | ICD-10-CM

## 2017-06-23 MED ORDER — FLUCONAZOLE 150 MG PO TABS: 150.0000 mg | ORAL_TABLET | ORAL | 0 refills | Status: DC

## 2017-06-23 NOTE — Addendum Note (Signed)
Addended by: Elouise Munroe on: 06/23/2017 02:56 PM   Modules accepted: Orders

## 2017-06-23 NOTE — Progress Notes (Signed)
Chief complaint: 1.  Vaginal spotting 2.  Morbid obesity 3.  History of endometrial hyperplasia status post hysteroscopy/D&C 4.  Left knee replacement surgery upcoming  Patient presents for evaluation of possible vaginal infection.  She developed vaginal spotting over several days, minimal, without pelvic pain.  She is no longer taking previously prescribed Megace for suppression of endometrial hyperplasia.  D&C recently performed did not demonstrate any residual hyperplasia. Patient does report sense of vaginal fullness and irritation.  She is not having any significant vaginal discharge. Patient is to undergo left knee replacement on Monday (6 days) past medical history, past surgical history, problem list, medications, and allergies are reviewed  OBJECTIVE: BP (!) 165/72   Pulse (!) 48   Ht 5\' 5"  (1.651 m)   Wt 249 lb 1.6 oz (113 kg)   LMP 01/26/2017   BMI 41.45 kg/m  Pleasant well-appearing female in no acute distress.  Alert and oriented. Back: No CVA tenderness Abdomen: Soft, nontender; large pannus Pelvic exam: External genitalia-normal BUS-normal Vagina-white secretions with patches of plaque, minimal, within vagina Cervix-no lesions; no blood at the office; no cervical motion tenderness Uterus-midplane, normal size and shape, nontender, mobile Adnexa-nonpalpable nontender Rectovaginal-normal external exam  PROCEDURE: Wet prep Normal saline-negative KOH-branching hyphae present  ASSESSMENT: 1.  Monilia vaginitis 2.  History of light vaginal spotting, no active bleeding at the present time, spotting possibly related to monilia dermatitis 3.  History of endometrial hyperplasia, status post Megace therapy and hysteroscopy/D&C in July 2018  PLAN: 1.  Wet prep is completed 2.  Diflucan 150 mg orally today and a second dose in 3 days 3.  Follow-up as needed  A total of 15 minutes were spent face-to-face with the patient during this encounter and over half of that time  dealt with counseling and coordination of care.  Brayton Mars, MD  Note: This dictation was prepared with Dragon dictation along with smaller phrase technology. Any transcriptional errors that result from this process are unintentional.

## 2017-06-23 NOTE — Patient Instructions (Signed)
1.  Yeast infection is identified today. 2.  Take Diflucan 150 mg orally today and another tablet in 3 days.  This will not have an impact on your upcoming surgery 3.  Follow-up as needed

## 2017-06-28 ENCOUNTER — Ambulatory Visit: Payer: Medicare Other | Admitting: Anesthesiology

## 2017-06-28 MED ORDER — CEFAZOLIN SODIUM-DEXTROSE 2-4 GM/100ML-% IV SOLN: 2.0000 g | INTRAVENOUS | Status: DC

## 2017-06-28 MED ORDER — SODIUM CHLORIDE 0.9 % IV SOLN
1000.0000 mg | INTRAVENOUS | Status: DC
  Filled 2017-06-28: qty 10

## 2017-06-29 ENCOUNTER — Observation Stay
Admission: RE | Admit: 2017-06-29 | Discharge: 2017-06-30 | Disposition: A | Discharge status: Home / Self Care | Payer: Medicare Other | Source: Ambulatory Visit | Attending: Internal Medicine | Admitting: Internal Medicine

## 2017-06-29 ENCOUNTER — Other Ambulatory Visit: Payer: Self-pay

## 2017-06-29 ENCOUNTER — Observation Stay (HOSPITAL_BASED_OUTPATIENT_CLINIC_OR_DEPARTMENT_OTHER)
Admission: RE | Admit: 2017-06-29 | Discharge: 2017-06-29 | Disposition: A | Discharge status: Home / Self Care | Payer: Medicare Other | Source: Ambulatory Visit | Attending: Internal Medicine | Admitting: Internal Medicine

## 2017-06-29 ENCOUNTER — Observation Stay: Payer: Medicare Other

## 2017-06-29 ENCOUNTER — Encounter: Payer: Self-pay | Admitting: Emergency Medicine

## 2017-06-29 ENCOUNTER — Encounter
Admission: RE | Disposition: A | Discharge status: Home / Self Care | Payer: Self-pay | Source: Ambulatory Visit | Attending: Internal Medicine

## 2017-06-29 DIAGNOSIS — R0789 Other chest pain: Secondary | ICD-10-CM | POA: Diagnosis not present

## 2017-06-29 DIAGNOSIS — I129 Hypertensive chronic kidney disease with stage 1 through stage 4 chronic kidney disease, or unspecified chronic kidney disease: Secondary | ICD-10-CM | POA: Insufficient documentation

## 2017-06-29 DIAGNOSIS — Z905 Acquired absence of kidney: Secondary | ICD-10-CM | POA: Diagnosis not present

## 2017-06-29 DIAGNOSIS — Z6841 Body Mass Index (BMI) 40.0 and over, adult: Secondary | ICD-10-CM | POA: Diagnosis not present

## 2017-06-29 DIAGNOSIS — G2581 Restless legs syndrome: Secondary | ICD-10-CM | POA: Insufficient documentation

## 2017-06-29 DIAGNOSIS — M1712 Unilateral primary osteoarthritis, left knee: Secondary | ICD-10-CM | POA: Diagnosis present

## 2017-06-29 DIAGNOSIS — R079 Chest pain, unspecified: Secondary | ICD-10-CM | POA: Diagnosis present

## 2017-06-29 DIAGNOSIS — F1210 Cannabis abuse, uncomplicated: Secondary | ICD-10-CM | POA: Insufficient documentation

## 2017-06-29 DIAGNOSIS — E559 Vitamin D deficiency, unspecified: Secondary | ICD-10-CM | POA: Diagnosis not present

## 2017-06-29 DIAGNOSIS — Z79899 Other long term (current) drug therapy: Secondary | ICD-10-CM | POA: Insufficient documentation

## 2017-06-29 DIAGNOSIS — R0602 Shortness of breath: Secondary | ICD-10-CM | POA: Diagnosis not present

## 2017-06-29 DIAGNOSIS — N183 Chronic kidney disease, stage 3 (moderate): Secondary | ICD-10-CM | POA: Diagnosis not present

## 2017-06-29 DIAGNOSIS — F1410 Cocaine abuse, uncomplicated: Secondary | ICD-10-CM | POA: Diagnosis not present

## 2017-06-29 DIAGNOSIS — M79606 Pain in leg, unspecified: Secondary | ICD-10-CM

## 2017-06-29 DIAGNOSIS — Z5309 Procedure and treatment not carried out because of other contraindication: Secondary | ICD-10-CM | POA: Insufficient documentation

## 2017-06-29 DIAGNOSIS — N950 Postmenopausal bleeding: Secondary | ICD-10-CM | POA: Diagnosis not present

## 2017-06-29 LAB — TSH: TSH: 2.162 u[IU]/mL (ref 0.350–4.500)

## 2017-06-29 LAB — MAGNESIUM: Magnesium: 2.3 mg/dL (ref 1.7–2.4)

## 2017-06-29 LAB — URINE DRUG SCREEN, QUALITATIVE (ARMC ONLY)
Amphetamines, Ur Screen: NOT DETECTED
Barbiturates, Ur Screen: NOT DETECTED
Benzodiazepine, Ur Scrn: NOT DETECTED
Cannabinoid 50 Ng, Ur ~~LOC~~: NOT DETECTED
Cocaine Metabolite,Ur ~~LOC~~: NOT DETECTED
MDMA (Ecstasy)Ur Screen: NOT DETECTED
Methadone Scn, Ur: NOT DETECTED
Opiate, Ur Screen: NOT DETECTED
Phencyclidine (PCP) Ur S: NOT DETECTED
Tricyclic, Ur Screen: NOT DETECTED

## 2017-06-29 LAB — TROPONIN I
Troponin I: <0.03 ng/mL (ref <0.03)
Troponin I: <0.03 ng/mL (ref <0.03)
Troponin I: <0.03 ng/mL (ref <0.03)

## 2017-06-29 LAB — CBC
HCT: 41.2 % (ref 35.0–47.0)
Hemoglobin: 13.6 g/dL (ref 12.0–16.0)
MCH: 29.6 pg (ref 26.0–34.0)
MCHC: 32.9 g/dL (ref 32.0–36.0)
MCV: 89.9 fL (ref 80.0–100.0)
Platelets: 239 10*3/uL (ref 150–440)
RBC: 4.59 MIL/uL (ref 3.80–5.20)
RDW: 13.4 % (ref 11.5–14.5)
WBC: 5.6 10*3/uL (ref 3.6–11.0)

## 2017-06-29 LAB — PROTIME-INR
INR: 0.88
Prothrombin Time: 11.9 seconds (ref 11.4–15.2)

## 2017-06-29 SURGERY — ARTHROPLASTY, KNEE, TOTAL
Anesthesia: Choice | Laterality: Left

## 2017-06-29 MED ORDER — TEMAZEPAM 7.5 MG PO CAPS: 7.5000 mg | ORAL_CAPSULE | Freq: Every evening | ORAL | Status: DC | PRN

## 2017-06-29 MED ORDER — ACETAMINOPHEN 500 MG PO TABS
ORAL_TABLET | ORAL | Status: AC
  Administered 2017-06-29: 1000 mg via ORAL
  Filled 2017-06-29: qty 2

## 2017-06-29 MED ORDER — ACETAMINOPHEN 500 MG PO TABS
1000.0000 mg | ORAL_TABLET | Freq: Once | ORAL | Status: AC
  Administered 2017-06-29: 1000 mg via ORAL

## 2017-06-29 MED ORDER — CHLORHEXIDINE GLUCONATE 4 % EX LIQD: 60.0000 mL | Freq: Once | CUTANEOUS | Status: DC

## 2017-06-29 MED ORDER — CARVEDILOL 3.125 MG PO TABS
3.1250 mg | ORAL_TABLET | Freq: Two times a day (BID) | ORAL | Status: DC
  Administered 2017-06-29 – 2017-06-30 (×2): 3.125 mg via ORAL
  Filled 2017-06-29 (×2): qty 1

## 2017-06-29 MED ORDER — BUDESONIDE 0.5 MG/2ML IN SUSP
0.5000 mg | Freq: Two times a day (BID) | RESPIRATORY_TRACT | Status: DC
  Administered 2017-06-29: 0.5 mg via RESPIRATORY_TRACT
  Filled 2017-06-29 (×2): qty 2

## 2017-06-29 MED ORDER — CEFAZOLIN SODIUM-DEXTROSE 2-4 GM/100ML-% IV SOLN
INTRAVENOUS | Status: AC
  Filled 2017-06-29: qty 100

## 2017-06-29 MED ORDER — ACETAMINOPHEN 325 MG PO TABS: 650.0000 mg | ORAL_TABLET | Freq: Four times a day (QID) | ORAL | Status: DC | PRN

## 2017-06-29 MED ORDER — AMLODIPINE BESYLATE 10 MG PO TABS
10.0000 mg | ORAL_TABLET | Freq: Every day | ORAL | Status: DC
  Administered 2017-06-30: 10 mg via ORAL
  Filled 2017-06-29: qty 1

## 2017-06-29 MED ORDER — GABAPENTIN 100 MG PO CAPS
200.0000 mg | ORAL_CAPSULE | Freq: Two times a day (BID) | ORAL | Status: DC
  Administered 2017-06-29 – 2017-06-30 (×2): 200 mg via ORAL
  Filled 2017-06-29 (×2): qty 2

## 2017-06-29 MED ORDER — GABAPENTIN 300 MG PO CAPS
300.0000 mg | ORAL_CAPSULE | Freq: Once | ORAL | Status: AC
  Administered 2017-06-29: 300 mg via ORAL

## 2017-06-29 MED ORDER — HYDRALAZINE HCL 25 MG PO TABS
35.0000 mg | ORAL_TABLET | Freq: Three times a day (TID) | ORAL | Status: DC
  Administered 2017-06-29 – 2017-06-30 (×3): 35 mg via ORAL
  Filled 2017-06-29 (×6): qty 1

## 2017-06-29 MED ORDER — LACTATED RINGERS IV SOLN
INTRAVENOUS | Status: DC
  Administered 2017-06-29: 07:00:00 via INTRAVENOUS

## 2017-06-29 MED ORDER — ROPINIROLE HCL 0.25 MG PO TABS
0.5000 mg | ORAL_TABLET | Freq: Every day | ORAL | Status: DC
  Administered 2017-06-29: 0.5 mg via ORAL
  Filled 2017-06-29: qty 1
  Filled 2017-06-29: qty 2

## 2017-06-29 MED ORDER — GABAPENTIN 300 MG PO CAPS
ORAL_CAPSULE | ORAL | Status: AC
  Administered 2017-06-29: 300 mg via ORAL
  Filled 2017-06-29: qty 1

## 2017-06-29 MED ORDER — FAMOTIDINE 20 MG PO TABS
20.0000 mg | ORAL_TABLET | Freq: Two times a day (BID) | ORAL | Status: DC
  Administered 2017-06-29 – 2017-06-30 (×3): 20 mg via ORAL
  Filled 2017-06-29 (×3): qty 1

## 2017-06-29 MED ORDER — IPRATROPIUM-ALBUTEROL 0.5-2.5 (3) MG/3ML IN SOLN
3.0000 mL | Freq: Four times a day (QID) | RESPIRATORY_TRACT | Status: DC
  Administered 2017-06-29: 3 mL via RESPIRATORY_TRACT
  Filled 2017-06-29 (×2): qty 3

## 2017-06-29 MED ORDER — TRAMADOL HCL 50 MG PO TABS
50.0000 mg | ORAL_TABLET | Freq: Two times a day (BID) | ORAL | Status: DC
  Administered 2017-06-29 – 2017-06-30 (×3): 50 mg via ORAL
  Filled 2017-06-29 (×3): qty 1

## 2017-06-29 MED ORDER — HYDRALAZINE HCL 25 MG PO TABS: 25.0000 mg | ORAL_TABLET | Freq: Three times a day (TID) | ORAL | Status: DC

## 2017-06-29 MED ORDER — BACITRACIN 50000 UNITS IM SOLR
INTRAMUSCULAR | Status: AC
  Filled 2017-06-29: qty 1

## 2017-06-29 MED ORDER — ASPIRIN EC 81 MG PO TBEC
81.0000 mg | DELAYED_RELEASE_TABLET | Freq: Every day | ORAL | Status: DC
  Administered 2017-06-29 – 2017-06-30 (×2): 81 mg via ORAL
  Filled 2017-06-29 (×2): qty 1

## 2017-06-29 SURGICAL SUPPLY — 46 items
BASIN GRAD PLASTIC 32OZ STRL (MISCELLANEOUS) ×2 IMPLANT
BLADE SAW 1 (BLADE) ×2 IMPLANT
BLADE SAW 1/2 (BLADE) ×2 IMPLANT
BOWL CEMENT MIX W/ADAPTER (MISCELLANEOUS) ×2 IMPLANT
BRUSH SCRUB EZ  4% CHG (MISCELLANEOUS) ×2
BRUSH SCRUB EZ 4% CHG (MISCELLANEOUS) ×2 IMPLANT
CANISTER SUCT 1200ML W/VALVE (MISCELLANEOUS) ×2 IMPLANT
CANISTER SUCT 3000ML PPV (MISCELLANEOUS) ×4 IMPLANT
CHLORAPREP W/TINT 26ML (MISCELLANEOUS) ×4 IMPLANT
COOLER POLAR GLACIER W/PUMP (MISCELLANEOUS) ×2 IMPLANT
CUFF TOURN 30 STER DUAL PORT (MISCELLANEOUS) ×2 IMPLANT
DRAPE INCISE IOBAN 66X60 STRL (DRAPES) ×2 IMPLANT
DRAPE SHEET LG 3/4 BI-LAMINATE (DRAPES) ×2 IMPLANT
ELECT REM PT RETURN 9FT ADLT (ELECTROSURGICAL) ×2
ELECTRODE REM PT RTRN 9FT ADLT (ELECTROSURGICAL) ×1 IMPLANT
GAUZE PETRO XEROFOAM 1X8 (MISCELLANEOUS) ×2 IMPLANT
GLOVE INDICATOR 8.0 STRL GRN (GLOVE) ×2 IMPLANT
GLOVE SURG ORTHO 8.0 STRL STRW (GLOVE) ×6 IMPLANT
GOWN STRL REUS W/ TWL LRG LVL3 (GOWN DISPOSABLE) ×1 IMPLANT
GOWN STRL REUS W/ TWL XL LVL3 (GOWN DISPOSABLE) ×1 IMPLANT
GOWN STRL REUS W/TWL LRG LVL3 (GOWN DISPOSABLE) ×1
GOWN STRL REUS W/TWL XL LVL3 (GOWN DISPOSABLE) ×1
HOOD PEEL AWAY FLYTE STAYCOOL (MISCELLANEOUS) ×6 IMPLANT
IV NS 1000ML (IV SOLUTION) ×1
IV NS 1000ML BAXH (IV SOLUTION) ×1 IMPLANT
KIT RM TURNOVER STRD PROC AR (KITS) ×2 IMPLANT
NEEDLE 18GX1X1/2 (RX/OR ONLY) (NEEDLE) ×2 IMPLANT
NEEDLE SPNL 20GX3.5 QUINCKE YW (NEEDLE) ×2 IMPLANT
NS IRRIG 1000ML POUR BTL (IV SOLUTION) ×2 IMPLANT
PACK TOTAL KNEE (MISCELLANEOUS) ×2 IMPLANT
PAD DE MAYO PRESSURE PROTECT (MISCELLANEOUS) ×2 IMPLANT
PAD WRAPON POLAR KNEE (MISCELLANEOUS) ×1 IMPLANT
PULSAVAC PLUS IRRIG FAN TIP (DISPOSABLE) ×2
STAPLER SKIN PROX 35W (STAPLE) ×2 IMPLANT
SUCTION FRAZIER HANDLE 10FR (MISCELLANEOUS) ×1
SUCTION TUBE FRAZIER 10FR DISP (MISCELLANEOUS) ×1 IMPLANT
SUT DVC 2 QUILL PDO  T11 36X36 (SUTURE) ×1
SUT DVC 2 QUILL PDO T11 36X36 (SUTURE) ×1 IMPLANT
SUT VIC AB 2-0 CT1 18 (SUTURE) ×2 IMPLANT
SUT VIC AB 2-0 CT1 27 (SUTURE) ×1
SUT VIC AB 2-0 CT1 TAPERPNT 27 (SUTURE) ×1 IMPLANT
SUT VIC AB PLUS 45CM 1-MO-4 (SUTURE) ×2 IMPLANT
SYR 30ML LL (SYRINGE) ×6 IMPLANT
TIP FAN IRRIG PULSAVAC PLUS (DISPOSABLE) ×1 IMPLANT
TRAY FOLEY W/METER SILVER 16FR (SET/KITS/TRAYS/PACK) IMPLANT
WRAPON POLAR PAD KNEE (MISCELLANEOUS) ×2

## 2017-06-29 NOTE — Anesthesia Preprocedure Evaluation (Deleted)
Anesthesia Evaluation  Patient identified by MRN, date of birth, ID band Patient awake    Reviewed: Allergy & Precautions, NPO status , Patient's Chart, lab work & pertinent test results  Airway Mallampati: III       Dental  (+) Upper Dentures   Pulmonary neg pulmonary ROS,     + decreased breath sounds      Cardiovascular Exercise Tolerance: Good hypertension, Pt. on medications and Pt. on home beta blockers  Rhythm:Regular     Neuro/Psych Restless legs negative psych ROS   GI/Hepatic negative GI ROS, Neg liver ROS,   Endo/Other  Morbid obesity  Renal/GU Renal InsufficiencyRenal diseaseS/p nephrectomy  negative genitourinary   Musculoskeletal  (+) Arthritis , Osteoarthritis,    Abdominal (+) + obese,   Peds negative pediatric ROS (+)  Hematology negative hematology ROS (+)   Anesthesia Other Findings   Reproductive/Obstetrics                             Anesthesia Physical  Anesthesia Plan  ASA: III  Anesthesia Plan:    Post-op Pain Management:  Regional for Post-op pain   Induction: Intravenous  PONV Risk Score and Plan:   Airway Management Planned: Nasal Cannula  Additional Equipment:   Intra-op Plan:   Post-operative Plan:   Informed Consent: I have reviewed the patients History and Physical, chart, labs and discussed the procedure including the risks, benefits and alternatives for the proposed anesthesia with the patient or authorized representative who has indicated his/her understanding and acceptance.     Plan Discussed with: CRNA  Anesthesia Plan Comments:         Anesthesia Quick Evaluation

## 2017-06-29 NOTE — H&P (Signed)
The patient has been re-examined, and the chart reviewed, and she now reports left sided chest pain and cough that has just developed overnight. Will cancel her surgery for today and have the hospitalist evaluate the patient.  Anesthesia was consulted regarding a peripheral nerve block for post-operative pain.  The patient understands the significant increased risk with proceeding with surgery if there is any possible acute cardiac or pulmonary issue. She is happy with the plan to delay surgery.

## 2017-06-29 NOTE — Progress Notes (Signed)
*  PRELIMINARY RESULTS* Echocardiogram 2D Echocardiogram has been performed.  Whitney [name] Fetterly 06/29/2017, 9:32 PM

## 2017-06-29 NOTE — OR Nursing (Signed)
Report given to Caryl Pina, RN on 2A.

## 2017-06-29 NOTE — OR Nursing (Signed)
Consult requested with hospitalist per Dr. Kayleen Memos.  After speaking with Dr. Lisbeth Renshaw she asked if Dr. Kayleen Memos would consult a cardiologist instead.  Dr. Oren Section agreed to cancel consult with hospitalist and requests cardiology to see her.  Patient sees Dr. Humphrey Rolls as her primary/cardiologist.  Office called they are unable to see her in office and Dr. Humphrey Rolls does not do hospital rounds.  Counsulted on-call cardiology, Dr. Fletcher Anon.

## 2017-06-29 NOTE — Plan of Care (Signed)
  Clinical Measurements: Diagnostic test results will improve 06/29/2017 1807 - Progressing by Daron Offer, RN Note US Venous Lower Bilateral scan today to evaluate for a DVT was negative, along w/ a CXR. Will continue to monitor patient progress. Wenda Low [facility]

## 2017-06-29 NOTE — H&P (Addendum)
[place] [facility] [place] NAME: Whitney [name]    MR#:  [id number]  DATE OF BIRTH:  Jul 12, 1951  DATE OF ADMISSION:  06/29/2017  PRIMARY CARE PHYSICIAN: [facility]   REQUESTING/REFERRING PHYSICIAN: Dr Kurtis Bushman  CHIEF COMPLAINT:  Chest pain, shortness of breath and cough  HISTORY OF PRESENT ILLNESS:  Whitney [name]  is a 66 y.o. female was scheduled to have an elective joint surgery.  She has been nervous about the surgery going for about 2 weeks now. She stated when the anesthesiologist came to talk with her she started coughing and then developed a pain described as a knot under her left breast.  It goes from her front to her back.  She started sweating got heart palpitations.  She does have some shortness of breath.  This episode lasted about a minute.  Now still has some soreness to the left chest.  She has been coughing up some clear phlegm.  Over the past couple weeks she has noticed when she is walk from the bedroom to the bathroom she was sweating while she was walking.  Cardiology saw the patient in the perioperative area and recommended observation for chest pain and a stress test if cardiac enzymes are negative.  Hospitalist services were contacted for admission  PAST MEDICAL HISTORY:   Past Medical History:  Diagnosis Date  . Abnormal Pap smear of cervix    Pt states she had colposcopy   . Arthritis   . Chronic kidney disease   . Hx of unilateral nephrectomy 1979   Left Nephrectomy  . Hypertension   . PMB (postmenopausal bleeding)   . Restless leg syndrome   . Vitamin D deficiency     PAST SURGICAL HISTORY:   Past Surgical History:  Procedure Laterality Date  . HYSTEROSCOPY W/D&C N/A 02/16/2017   Procedure: DILATATION AND CURETTAGE /HYSTEROSCOPY;  Surgeon: Defrancesco, Alanda Slim, MD;  Location: [facility] ORS;  Service: Gynecology;  Laterality: N/A;  . KIDNEY SURGERY Left 1979   left kidney removed  .  WRIST ARTHROSCOPY Left 1970s    SOCIAL HISTORY:   Social History   Tobacco Use  . Smoking status: Never Smoker  . Smokeless tobacco: Never Used  Substance Use Topics  . Alcohol use: Yes    Comment: occasionally    FAMILY HISTORY:   Family History  Problem Relation Age of Onset  . Ovarian cancer Mother   . Hypertension Father   . Diabetes Father   . Cancer Father   . Breast cancer Sister   . Diabetes Sister     DRUG ALLERGIES:   Allergies  Allergen Reactions  . Enalapril Swelling  . Lisinopril Swelling    REVIEW OF SYSTEMS:  CONSTITUTIONAL: No fever, positive for sweating.  Positive for fatigue.  EYES: No blurred or double vision.  Occasional pain in the back of her eyes. EARS, NOSE, AND THROAT: No tinnitus or ear pain. No sore throat RESPIRATORY: Positive for cough and shortness of breath.  Occasional wheezing.  No hemoptysis.  CARDIOVASCULAR: Positive for chest pain and palpitation.  GASTROINTESTINAL: No nausea, vomiting, diarrhea or abdominal pain.  Occasional blood when she wipes GENITOURINARY: No dysuria, hematuria.  Has had vaginal bleeding worked up by gynecology as outpatient ENDOCRINE: No polyuria, nocturia,  HEMATOLOGY: No anemia, easy bruising or bleeding SKIN: No rash or lesion. MUSCULOSKELETAL: Positive for pain in legs NEUROLOGIC: No tingling, numbness, weakness.  PSYCHIATRY: History of anxiety and depression.   MEDICATIONS  AT HOME:   Prior to Admission medications   Medication Sig Start Date End Date Taking? Authorizing Provider  amLODipine (NORVASC) 10 MG tablet Take 10 mg daily by mouth.    Yes [provider]  carvedilol (COREG) 3.125 MG tablet Take 3.125 mg 2 (two) times daily with a meal by mouth.   Yes [provider]  ergocalciferol (VITAMIN D2) 50000 units capsule Take 50,000 Units every Sunday by mouth.   Yes [provider]  famotidine (PEPCID) 20 MG tablet Take 20 mg 2 (two) times daily by mouth.   Yes  [provider]  fluconazole (DIFLUCAN) 150 MG tablet Take 1 tablet (150 mg total) by mouth every 3 (three) days. 06/23/17  Yes Defrancesco, Alanda Slim, MD  gabapentin (NEURONTIN) 100 MG capsule Take 200 mg 2 (two) times daily by mouth.  06/04/17  Yes [provider]  hydrALAZINE (APRESOLINE) 10 MG tablet Take 10 mg 3 (three) times daily by mouth. Takes along with the 25 mg tablet to make it 35 mg three times daily   Yes [provider]  hydrALAZINE (APRESOLINE) 25 MG tablet Take 25 mg 3 (three) times daily by mouth. Takes with the 10 mg tablet to make it 35 mg three times daily   Yes [provider]  rOPINIRole (REQUIP) 0.5 MG tablet Take 0.5 mg at bedtime by mouth. Takes 1 or 2 tablets as needed   Yes [provider]  traMADol (ULTRAM) 50 MG tablet Take 50 mg 2 (two) times daily by mouth.    Yes [provider]      VITAL SIGNS:  Blood pressure (!) 145/91, pulse 87, temperature (!) 97.5 F (36.4 C), temperature source Oral, resp. rate 17, height 5\' 5"  (1.651 m), weight 112.9 kg (249 lb), last menstrual period 01/26/2017, SpO2 97 %.  PHYSICAL EXAMINATION:  GENERAL:  66 y.o.-year-old patient lying in the bed with no acute distress.  EYES: Pupils equal, round, reactive to light and accommodation. No scleral icterus. Extraocular muscles intact.  HEENT: Head atraumatic, normocephalic. Oropharynx and nasopharynx clear.  NECK:  Supple, no jugular venous distention. No thyroid enlargement, no tenderness.  LUNGS: Decreased breath sounds bilaterally, no wheezing, rales,rhonchi or crepitation. No use of accessory muscles of respiration.  CARDIOVASCULAR: S1, S2 normal. No murmurs, rubs, or gallops.  Reproducible palpation left chest wall. ABDOMEN: Soft, nontender, nondistended. Bowel sounds present. No organomegaly or mass.  EXTREMITIES: No pedal edema, cyanosis, or clubbing.  NEUROLOGIC: Cranial nerves II through XII are intact. Muscle strength 5/5 in  all extremities. Sensation intact. Gait not checked.  PSYCHIATRIC: The patient is alert and oriented x 3.  SKIN: No rash, lesion, or ulcer.   LABORATORY PANEL:   CBC Recent Labs  Lab 06/29/17 1020  WBC 5.6  HGB 13.6  HCT 41.2  PLT 239   ------------------------------------------------------------------------------------------------------------------  Chemistries  No results for input(s): NA, K, CL, CO2, GLUCOSE, BUN, CREATININE, CALCIUM, MG, AST, ALT, ALKPHOS, BILITOT in the last 168 hours.  Invalid input(s): GFRCGP ------------------------------------------------------------------------------------------------------------------  Cardiac Enzymes No results for input(s): TROPONINI in the last 168 hours. ------------------------------------------------------------------------------------------------------------------  RADIOLOGY:  No results found.  EKG:   Normal sinus rhythm 70 bpm left atrial enlargement  IMPRESSION AND PLAN:   1.  Chest pain and shortness of breath.  EKG unremarkable.  Obtain a chest x-ray and cardiac enzymes.  If cardiac enzymes are negative will get stress test tomorrow morning.  Telemetry monitoring.  Start nebulizer treatments.  Echocardiogram and ultrasound of the lower  extremity ordered.  This could also be anxiety too. 2.  Chronic kidney disease stage III with one kidney.  Watch closely. 3.  Essential hypertension on hydralazine and Norvasc 4.  Vaginal bleeding following up with gynecology as outpatient.  Already had a D&C 5.  Vitamin D deficiency on vitamin D as outpatient 6.  Restless leg syndrome on Requip 7. Advised not to use cocaine or marijuana  All the records are reviewed and case discussed with ED provider. Management plans discussed with the patient, family and they are in agreement.  CODE STATUS: Full code  TOTAL TIME TAKING CARE OF THIS PATIENT: 50 minutes.    Loletha Grayer M.D on 06/29/2017 at 11:14 AM  Between 7am to 6pm -  Pager - [phone]  After 6pm call admission pager [phone]  [facility] Office  [phone]  CC: Primary care physician; [place], [place]

## 2017-06-29 NOTE — OR Nursing (Signed)
Patient had a dry hacky cough after given medications. Asked if patient needed some more water to get pills down. Patient declined. Patient did not complain to this RN of chest pain. The OR RN went into the room and patient started complaining of chest pain. This RN called Dr Kayleen Memos anesthesia for the patient. Patient states, "it is a tight feeling under my left breast and its worse when I cough."

## 2017-06-29 NOTE — Consult Note (Signed)
Cardiology Consultation:   Patient ID: Whitney [name]; [id number]; Jun 30, 1951   Admit date: 06/29/2017 Date of Consult: 06/29/2017  Primary Care Provider: [facility] Primary Cardiologist: New to [facility] - consult by Fletcher Anon   Patient Profile:   Whitney [name] is a 66 y.o. female with a hx of polysubstance abuse with ongoing cocaine and marijuana abuse, HTN, unilateral kidney s/p left nephrectomy, RLS, and arthritis who is being seen today for the evaluation of chest pain at the request of Dr. Benjie Karvonen.  History of Present Illness:   [name] has no previously known cardiac history. She has ongoing marijuana and cocaine abuse. She reports using cocaine "every few month when my friends have it. I don't buy it." She most recently used cocaine ~ 1 month prior per her report. She was scheduled for a left TKA this morning when she coughed and noted left-sided chest pain in the OR prior to her procedure. Pain was worse with cough and deep inspiration. Because of this, her surgery was cancelled and IM was asked to evaluate. IM has consulted cardiology. Upon talking with the patient this morning, she notes for ~ the past 1-2 weeks she has had decreased energy and been easily fatigued. She has also noted chest tightness that is not associated with exertion and will last ~ 5 minutes and self resolve. There has been some associated diaphoresis, SOB, and palpitations with this pain. No family history of premature CAD. She does not have known DM. She has diet-controlled HLD. She takes medications below for her HTN. Currently with 2/10 left-sided chest pain that is worse with deep inspiration.   Past Medical History:  Diagnosis Date  . Abnormal Pap smear of cervix    Pt states she had colposcopy   . Arthritis   . Chronic kidney disease   . Hx of unilateral nephrectomy 1979   Left Nephrectomy  . Hypertension   . PMB (postmenopausal bleeding)   . Restless leg syndrome   . Vitamin D  deficiency     Past Surgical History:  Procedure Laterality Date  . HYSTEROSCOPY W/D&C N/A 02/16/2017   Procedure: DILATATION AND CURETTAGE /HYSTEROSCOPY;  Surgeon: Defrancesco, Alanda Slim, MD;  Location: [facility] ORS;  Service: Gynecology;  Laterality: N/A;  . KIDNEY SURGERY Left 1979   left kidney removed  . WRIST ARTHROSCOPY Left 1970s     Home Meds: Prior to Admission medications   Medication Sig Start Date End Date Taking? Authorizing Provider  amLODipine (NORVASC) 10 MG tablet Take 10 mg daily by mouth.    Yes [provider]  carvedilol (COREG) 3.125 MG tablet Take 3.125 mg 2 (two) times daily with a meal by mouth.   Yes [provider]  ergocalciferol (VITAMIN D2) 50000 units capsule Take 50,000 Units every Sunday by mouth.   Yes [provider]  famotidine (PEPCID) 20 MG tablet Take 20 mg 2 (two) times daily by mouth.   Yes [provider]  fluconazole (DIFLUCAN) 150 MG tablet Take 1 tablet (150 mg total) by mouth every 3 (three) days. 06/23/17  Yes Defrancesco, Alanda Slim, MD  gabapentin (NEURONTIN) 100 MG capsule Take 200 mg 2 (two) times daily by mouth.  06/04/17  Yes [provider]  hydrALAZINE (APRESOLINE) 10 MG tablet Take 10 mg 3 (three) times daily by mouth. Takes along with the 25 mg tablet to make it 35 mg three times daily   Yes [provider]  hydrALAZINE (APRESOLINE) 25 MG tablet Take  25 mg 3 (three) times daily by mouth. Takes with the 10 mg tablet to make it 35 mg three times daily   Yes [provider]  rOPINIRole (REQUIP) 0.5 MG tablet Take 0.5 mg at bedtime by mouth. Takes 1 or 2 tablets as needed   Yes [provider]  traMADol (ULTRAM) 50 MG tablet Take 50 mg 2 (two) times daily by mouth.    Yes [provider]    Inpatient Medications: Scheduled Meds: . chlorhexidine  60 mL Topical Once   Continuous Infusions: . ceFAZolin    .  ceFAZolin (ANCEF) IV    . lactated ringers 10 mL/hr at  06/29/17 0657  . tranexamic acid     PRN Meds:   Allergies:   Allergies  Allergen Reactions  . Enalapril Swelling  . Lisinopril Swelling    Social History:   Social History   Socioeconomic History  . Marital status: Divorced    Spouse name: Not on file  . Number of children: Not on file  . Years of education: Not on file  . Highest education level: Not on file  Social Needs  . Financial resource strain: Not on file  . Food insecurity - worry: Not on file  . Food insecurity - inability: Not on file  . Transportation needs - medical: Not on file  . Transportation needs - non-medical: Not on file  Occupational History  . Not on file  Tobacco Use  . Smoking status: Never Smoker  . Smokeless tobacco: Never Used  Substance and Sexual Activity  . Alcohol use: Yes    Comment: occasionally  . Drug use: Yes    Frequency: 3.0 times per week    Types: Marijuana, Cocaine    Comment: Pt states she uses for leg pain: marijuana; states no longer uses cocaine  . Sexual activity: Yes    Birth control/protection: None  Other Topics Concern  . Not on file  Social History Narrative  . Not on file     Family History:  Family History  Problem Relation Age of Onset  . Ovarian cancer Mother   . Hypertension Father   . Diabetes Father   . Breast cancer Sister     ROS:  Review of Systems  Constitutional: Positive for malaise/fatigue. Negative for chills, diaphoresis, fever and weight loss.  HENT: Negative for congestion.   Eyes: Negative for discharge and redness.  Respiratory: Positive for shortness of breath. Negative for cough, hemoptysis, sputum production and wheezing.   Cardiovascular: Positive for chest pain and palpitations. Negative for orthopnea, claudication, leg swelling and PND.  Gastrointestinal: Negative for abdominal pain, blood in stool, heartburn, melena, nausea and vomiting.  Genitourinary: Negative for hematuria.  Musculoskeletal: Positive for joint pain and  myalgias. Negative for falls.  Skin: Negative for rash.  Neurological: Positive for weakness. Negative for dizziness, tingling, tremors, sensory change, speech change, focal weakness and loss of consciousness.  Endo/Heme/Allergies: Does not bruise/bleed easily.  Psychiatric/Behavioral: Positive for substance abuse. Negative for depression. The patient is nervous/anxious.   All other systems reviewed and are negative.     Physical Exam/Data:   Vitals:   06/29/17 0640  BP: (!) 145/91  Pulse: 87  Resp: 17  Temp: (!) 97.5 F (36.4 C)  TempSrc: Oral  SpO2: 97%  Weight: 249 lb (112.9 kg)  Height: 5\' 5"  (1.651 m)   No intake or output data in the 24 hours ending 06/29/17 0855 Filed Weights   06/29/17 0640  Weight:  249 lb (112.9 kg)   Body mass index is 41.44 kg/m.   Physical Exam: General: Well developed, well nourished, in no acute distress. Head: Normocephalic, atraumatic, sclera non-icteric, no xanthomas, nares without discharge.  Neck: Negative for carotid bruits. JVD not elevated. Lungs: Clear bilaterally to auscultation without wheezes, rales, or rhonchi. Breathing is unlabored. Heart: RRR with S1 S2. No murmurs, rubs, or gallops appreciated. Palpation of the left-side anterior chest wall fully reproduces the patient's pain.  Abdomen: Soft, non-tender, non-distended with normoactive bowel sounds. No hepatomegaly. No rebound/guarding. No obvious abdominal masses. Msk:  Strength and tone appear normal for age. Extremities: No clubbing or cyanosis. No edema. Distal pedal pulses are 2+ and equal bilaterally. Neuro: Alert and oriented X 3. No facial asymmetry. No focal deficit. Moves all extremities spontaneously. Psych:  Responds to questions appropriately with a normal affect.   EKG:  The EKG was personally reviewed and demonstrates: NSR, 78 bpm, left atrial enlargement, early repolarization with nonspecific st/t changes Telemetry:  Telemetry was personally reviewed and  demonstrates: not on telemetry   Weights: Filed Weights   06/29/17 0640  Weight: 249 lb (112.9 kg)    Relevant CV Studies: none  Laboratory Data:  ChemistryNo results for input(s): NA, K, CL, CO2, GLUCOSE, BUN, CREATININE, CALCIUM, GFRNONAA, GFRAA, ANIONGAP in the last 168 hours.  No results for input(s): PROT, ALBUMIN, AST, ALT, ALKPHOS, BILITOT in the last 168 hours. HematologyNo results for input(s): WBC, RBC, HGB, HCT, MCV, MCH, MCHC, RDW, PLT in the last 168 hours. Cardiac EnzymesNo results for input(s): TROPONINI in the last 168 hours. No results for input(s): TROPIPOC in the last 168 hours.  BNPNo results for input(s): BNP, PROBNP in the last 168 hours.  DDimer No results for input(s): DDIMER in the last 168 hours.  Radiology/Studies:  No results found.  Assessment and Plan:   1. Chest pain with moderate risk for cardiac etiology: -Continues to note squeezing, left-sided chest pain that is worse with deep inpsiration  -Cycle troponin x 3 to rule out -Check echo -If rules out, schedule Lexiscan Myoview for AM of 11/27 -Nitropaste prn -IM to evaluate for non-cardiac etiologies of chest pain  2. Polysubstance abuse: -Cessation of cocaine advised   3. HTN: -Resume PTA medications  4. Left knee pain: -Initially scheduled for total knee arthroplasty  -Surgery cancelled given the above chest pain -Pre-operative risk assessment pending the above workup  5. Palpitations: -Monitor on telemetry -Check potassium, magnesium, tsh, cbc  6. Dispo: -IM to admit -Rule out -Myoview in the following AM    For questions or updates, please contact [place] Please consult [email or web address] for contact info under Cardiology/STEMI.   Signed, Christell Faith, PA-C [place] Pager: [phone] 06/29/2017, 8:55 AM

## 2017-06-29 NOTE — Progress Notes (Signed)
Patient off unit for testing. Will resume admission process upon return. Wenda Low [facility]

## 2017-06-30 ENCOUNTER — Observation Stay (HOSPITAL_BASED_OUTPATIENT_CLINIC_OR_DEPARTMENT_OTHER): Payer: Medicare Other

## 2017-06-30 DIAGNOSIS — R0789 Other chest pain: Secondary | ICD-10-CM

## 2017-06-30 LAB — ECHOCARDIOGRAM COMPLETE
Height: 65 in
Weight: 3984 oz

## 2017-06-30 LAB — NM MYOCAR MULTI W/SPECT W/WALL MOTION / EF
LV dias vol: 95 mL (ref 46–106)
LV sys vol: 59 mL
Peak HR: 101 {beats}/min
Percent HR: 66 %
Rest HR: 79 {beats}/min
TID: 0.94

## 2017-06-30 MED ORDER — ALUM & MAG HYDROXIDE-SIMETH 200-200-20 MG/5ML PO SUSP
15.0000 mL | Freq: Once | ORAL | Status: AC
  Administered 2017-06-30: 15 mL via ORAL
  Filled 2017-06-30: qty 30

## 2017-06-30 MED ORDER — TECHNETIUM TC 99M TETROFOSMIN IV KIT
31.4960 | PACK | Freq: Once | INTRAVENOUS | Status: AC | PRN
  Administered 2017-06-30: 31.496 via INTRAVENOUS

## 2017-06-30 MED ORDER — REGADENOSON 0.4 MG/5ML IV SOLN
0.4000 mg | Freq: Once | INTRAVENOUS | Status: AC
  Administered 2017-06-30: 0.4 mg via INTRAVENOUS

## 2017-06-30 MED ORDER — TECHNETIUM TC 99M TETROFOSMIN IV KIT
13.4400 | PACK | Freq: Once | INTRAVENOUS | Status: AC | PRN
  Administered 2017-06-30: 13.44 via INTRAVENOUS

## 2017-06-30 MED ORDER — ASPIRIN 81 MG PO TBEC: 81.0000 mg | DELAYED_RELEASE_TABLET | Freq: Every day | ORAL | Status: DC

## 2017-06-30 NOTE — Discharge Summary (Addendum)
[place] [place] NAME: Whitney [name]    MR#:  [id number]  DATE OF BIRTH:  02/05/1951  DATE OF ADMISSION:  06/29/2017 ADMITTING PHYSICIAN: Loletha Grayer, MD  DATE OF DISCHARGE: 06/30/2017  PRIMARY CARE PHYSICIAN: [facility]    ADMISSION DIAGNOSIS:  M17.12 Unilateral primary osteoarthritis, left knee  DISCHARGE DIAGNOSIS:  Active Problems:   Chest pain   SECONDARY DIAGNOSIS:   Past Medical History:  Diagnosis Date  . Abnormal Pap smear of cervix    Pt states she had colposcopy   . Arthritis   . Chronic kidney disease   . Hx of unilateral nephrectomy 1979   Left Nephrectomy  . Hypertension   . PMB (postmenopausal bleeding)   . Restless leg syndrome   . Vitamin D deficiency     HOSPITAL COURSE:   66 year old female with history of hypertension who presented chest pain.  1. Chest pain: Patient was ruled out for ACS. Her troponins were negative. She underwent cardiac stress testing.   Normal myocardial perfusion without evidence of ischemia or scar.  The left ventricular ejection fraction is moderately decreased (30-44%). Given normal LVEF by echo performed yesterday, LVEF calculation on this study may be artificially low secondary to gating difficulties with frequent PVCs.  This is an intermediate risk study based on moderately reduced LVEF.  I spoke with cardiology and it appears taht due to the PVCS on stress is it causing artifically low EF, however it appears on ECHO she has normal EF and therefore stress is presumed to be low risk.   I will have her follow up with cardiology as an outpatient.  2. Essential hypertension: Continue Norvasc and Coreg and hydralazine  3. Chronic kidney disease stage III: Creatinine is at baseline  4. Restless leg syndrome: Continue Requip  5. Vitamin D deficiency: Continue vitamin D supplements   6. Vaginal bleeding: Patient follows up GYN     DISCHARGE  CONDITIONS AND DIET:  Stable Cardiac diet  CONSULTS OBTAINED:  Treatment Team:  Wellington Hampshire, MD  DRUG ALLERGIES:   Allergies  Allergen Reactions  . Enalapril Swelling  . Lisinopril Swelling    DISCHARGE MEDICATIONS:   Current Discharge Medication List    START taking these medications   Details  aspirin EC 81 MG EC tablet Take 1 tablet (81 mg total) by mouth daily.      CONTINUE these medications which have NOT CHANGED   Details  amLODipine (NORVASC) 10 MG tablet Take 10 mg daily by mouth.     carvedilol (COREG) 3.125 MG tablet Take 3.125 mg 2 (two) times daily with a meal by mouth.    ergocalciferol (VITAMIN D2) 50000 units capsule Take 50,000 Units every Sunday by mouth.    famotidine (PEPCID) 20 MG tablet Take 20 mg 2 (two) times daily by mouth.    fluconazole (DIFLUCAN) 150 MG tablet Take 1 tablet (150 mg total) by mouth every 3 (three) days. Qty: 2 tablet, Refills: 0    gabapentin (NEURONTIN) 100 MG capsule Take 200 mg 2 (two) times daily by mouth.  Refills: 0    !! hydrALAZINE (APRESOLINE) 10 MG tablet Take 10 mg 3 (three) times daily by mouth. Takes along with the 25 mg tablet to make it 35 mg three times daily    !! hydrALAZINE (APRESOLINE) 25 MG tablet Take 25 mg 3 (three) times daily by mouth. Takes with the 10 mg tablet to make it 35 mg three  times daily    rOPINIRole (REQUIP) 0.5 MG tablet Take 0.5 mg at bedtime by mouth. Takes 1 or 2 tablets as needed    traMADol (ULTRAM) 50 MG tablet Take 50 mg 2 (two) times daily by mouth.      !! - Potential duplicate medications found. Please discuss with provider.        Today   CHIEF COMPLAINT:   Denies chest pain overnight. No shortness of breath.   VITAL SIGNS:  Blood pressure (!) 125/92, pulse 77, temperature (!) 97.5 F (36.4 C), temperature source Oral, resp. rate 17, height 5\' 5"  (1.651 m), weight 112.9 kg (249 lb), last menstrual period 01/26/2017, SpO2 93 %.   REVIEW OF  SYSTEMS:  Review of Systems  Constitutional: Negative.  Negative for chills, fever and malaise/fatigue.  HENT: Negative.  Negative for ear discharge, ear pain, hearing loss, nosebleeds and sore throat.   Eyes: Negative.  Negative for blurred vision and pain.  Respiratory: Negative.  Negative for cough, hemoptysis, shortness of breath and wheezing.   Cardiovascular: Negative.  Negative for chest pain, palpitations and leg swelling.  Gastrointestinal: Negative.  Negative for abdominal pain, blood in stool, diarrhea, nausea and vomiting.  Genitourinary: Negative.  Negative for dysuria.  Musculoskeletal: Negative.  Negative for back pain.  Skin: Negative.   Neurological: Negative for dizziness, tremors, speech change, focal weakness, seizures and headaches.  Endo/Heme/Allergies: Negative.  Does not bruise/bleed easily.  Psychiatric/Behavioral: Negative.  Negative for depression, hallucinations and suicidal ideas.     PHYSICAL EXAMINATION:  GENERAL:  66 y.o.-year-old patient lying in the bed with no acute distress.  NECK:  Supple, no jugular venous distention. No thyroid enlargement, no tenderness.  LUNGS: Normal breath sounds bilaterally, no wheezing, rales,rhonchi  No use of accessory muscles of respiration.  CARDIOVASCULAR: S1, S2 normal. No murmurs, rubs, or gallops.  ABDOMEN: Soft, non-tender, non-distended. Bowel sounds present. No organomegaly or mass.  EXTREMITIES: No pedal edema, cyanosis, or clubbing.  PSYCHIATRIC: The patient is alert and oriented x 3.  SKIN: No obvious rash, lesion, or ulcer.   DATA REVIEW:   CBC Recent Labs  Lab 06/29/17 1020  WBC 5.6  HGB 13.6  HCT 41.2  PLT 239    Chemistries  Recent Labs  Lab 06/29/17 1020  MG 2.3    Cardiac Enzymes Recent Labs  Lab 06/29/17 1020 06/29/17 1729 06/29/17 2204  TROPONINI <0.03 <0.03 <0.03    Microbiology Results  @[email or web address] @  RADIOLOGY:  Dg Chest 2 View  Result Date: 06/29/2017 CLINICAL DATA:   Increased chest pain/pressure. EXAM: CHEST  2 VIEW COMPARISON:  None FINDINGS: There is moderate cardiac enlargement. No pleural effusion or edema identified. Mild asymmetric elevation of the left hemidiaphragm. No airspace opacities. IMPRESSION: No acute cardiopulmonary abnormality Electronically Signed   By: Kerby Moors M.D.   On: 06/29/2017 16:17   US Venous Img Lower Bilateral  Result Date: 06/29/2017 CLINICAL DATA:  Bilateral lower extremity pain and edema, right greater than left. Patient was scheduled for left total knee replacement today however the procedure was canceled due to patient's development of chest pain and shortness of breath. Evaluate for DVT. EXAM: BILATERAL LOWER EXTREMITY VENOUS DOPPLER ULTRASOUND TECHNIQUE: Gray-scale sonography with graded compression, as well as color Doppler and duplex ultrasound were performed to evaluate the lower extremity deep venous systems from the level of the common femoral vein and including the common femoral, femoral, profunda femoral, popliteal and calf veins including the posterior tibial, peroneal and gastrocnemius veins when  visible. The superficial great saphenous vein was also interrogated. Spectral Doppler was utilized to evaluate flow at rest and with distal augmentation maneuvers in the common femoral, femoral and popliteal veins. COMPARISON:  None. FINDINGS: RIGHT LOWER EXTREMITY Common Femoral Vein: No evidence of thrombus. Normal compressibility, respiratory phasicity and response to augmentation. Saphenofemoral Junction: No evidence of thrombus. Normal compressibility and flow on color Doppler imaging. Profunda Femoral Vein: No evidence of thrombus. Normal compressibility and flow on color Doppler imaging. Femoral Vein: No evidence of thrombus. Normal compressibility, respiratory phasicity and response to augmentation. Popliteal Vein: No evidence of thrombus. Normal compressibility, respiratory phasicity and response to augmentation. Calf  Veins: No evidence of thrombus. Normal compressibility and flow on color Doppler imaging. Superficial Great Saphenous Vein: No evidence of thrombus. Normal compressibility. Venous Reflux:  None. Other Findings:  None. LEFT LOWER EXTREMITY Common Femoral Vein: No evidence of thrombus. Normal compressibility, respiratory phasicity and response to augmentation. Saphenofemoral Junction: No evidence of thrombus. Normal compressibility and flow on color Doppler imaging. Profunda Femoral Vein: No evidence of thrombus. Normal compressibility and flow on color Doppler imaging. Femoral Vein: No evidence of thrombus. Normal compressibility, respiratory phasicity and response to augmentation. Popliteal Vein: No evidence of thrombus. Normal compressibility, respiratory phasicity and response to augmentation. Calf Veins: No evidence of thrombus. Normal compressibility and flow on color Doppler imaging. Superficial Great Saphenous Vein: No evidence of thrombus. Normal compressibility. Venous Reflux:  None. Other Findings:  None. IMPRESSION: No evidence of DVT within either lower extremity. Electronically Signed   By: Sandi Mariscal M.D.   On: 06/29/2017 16:06      Current Discharge Medication List    START taking these medications   Details  aspirin EC 81 MG EC tablet Take 1 tablet (81 mg total) by mouth daily.      CONTINUE these medications which have NOT CHANGED   Details  amLODipine (NORVASC) 10 MG tablet Take 10 mg daily by mouth.     carvedilol (COREG) 3.125 MG tablet Take 3.125 mg 2 (two) times daily with a meal by mouth.    ergocalciferol (VITAMIN D2) 50000 units capsule Take 50,000 Units every Sunday by mouth.    famotidine (PEPCID) 20 MG tablet Take 20 mg 2 (two) times daily by mouth.    fluconazole (DIFLUCAN) 150 MG tablet Take 1 tablet (150 mg total) by mouth every 3 (three) days. Qty: 2 tablet, Refills: 0    gabapentin (NEURONTIN) 100 MG capsule Take 200 mg 2 (two) times daily by mouth.   Refills: 0    !! hydrALAZINE (APRESOLINE) 10 MG tablet Take 10 mg 3 (three) times daily by mouth. Takes along with the 25 mg tablet to make it 35 mg three times daily    !! hydrALAZINE (APRESOLINE) 25 MG tablet Take 25 mg 3 (three) times daily by mouth. Takes with the 10 mg tablet to make it 35 mg three times daily    rOPINIRole (REQUIP) 0.5 MG tablet Take 0.5 mg at bedtime by mouth. Takes 1 or 2 tablets as needed    traMADol (ULTRAM) 50 MG tablet Take 50 mg 2 (two) times daily by mouth.      !! - Potential duplicate medications found. Please discuss with provider.          Management plans discussed with the patient and she is in agreement. Stable for discharge home  Patient should follow up with pcp  CODE STATUS:  Code Status History    This patient does not have  a recorded code status. Please follow your organizational policy for patients in this situation.      TOTAL TIME TAKING CARE OF THIS PATIENT: 37 minutes.    Note: This dictation was prepared with Dragon dictation along with smaller phrase technology. Any transcriptional errors that result from this process are unintentional.  Liesa Tsan M.D on 06/30/2017 at 11:31 AM  Between 7am to 6pm - Pager - [phone] After 6pm go to [email or web address] - password [email or web address]  Office  [phone]  CC: Primary care physician; [place], [place]

## 2017-06-30 NOTE — Progress Notes (Signed)
Pt transported out via wheelchair.

## 2017-06-30 NOTE — Care Management Obs Status (Signed)
[place] NOTIFICATION   Patient Details  Name: Prosperity Darrough MRN: [id number] Date of Birth: 05-Oct-1950   Medicare Observation Status Notification Given:  Yes Notice signed, one given to patient and the other to HIM for scanning    Katrina Stack, RN 06/30/2017, 12:12 PM

## 2017-06-30 NOTE — Plan of Care (Signed)
  Adequate for Discharge Education: Knowledge of General Education information will improve 06/30/2017 1241 - Adequate for Discharge by Feliberto Gottron, RN Health Behavior/Discharge Planning: Ability to manage health-related needs will improve 06/30/2017 1241 - Adequate for Discharge by Feliberto Gottron, RN Clinical Measurements: Ability to maintain clinical measurements within normal limits will improve 06/30/2017 1241 - Adequate for Discharge by Chriss Czar, Illene Bolus, RN Will remain free from infection 06/30/2017 1241 - Adequate for Discharge by Feliberto Gottron, RN Diagnostic test results will improve 06/30/2017 1241 - Adequate for Discharge by Feliberto Gottron, RN Respiratory complications will improve 06/30/2017 1241 - Adequate for Discharge by Feliberto Gottron, RN Cardiovascular complication will be avoided 06/30/2017 1241 - Adequate for Discharge by Feliberto Gottron, RN Activity: Risk for activity intolerance will decrease 06/30/2017 1241 - Adequate for Discharge by Feliberto Gottron, RN Nutrition: Adequate nutrition will be maintained 06/30/2017 1241 - Adequate for Discharge by Feliberto Gottron, RN Coping: Level of anxiety will decrease 06/30/2017 1241 - Adequate for Discharge by Feliberto Gottron, RN Elimination: Will not experience complications related to bowel motility 06/30/2017 1241 - Adequate for Discharge by Chriss Czar, Illene Bolus, RN Will not experience complications related to urinary retention 06/30/2017 1241 - Adequate for Discharge by Feliberto Gottron, RN Pain Managment: General experience of comfort will improve 06/30/2017 1241 - Adequate for Discharge by Feliberto Gottron, RN Safety: Ability to remain free from injury will improve 06/30/2017 1241 - Adequate for Discharge by Feliberto Gottron, RN Skin Integrity: Risk for impaired skin integrity will  decrease 06/30/2017 1241 - Adequate for Discharge by Feliberto Gottron, RN

## 2017-06-30 NOTE — Progress Notes (Signed)
Discharge instructions given, IV out and tele off. Pt verbalized  understanding. No further questions.

## 2017-07-02 LAB — CANDIDA 6 SPECIES PROFILE, NAA
Candida albicans, NAA: POSITIVE — AB
Candida glabrata, NAA: NEGATIVE
Candida krusei, NAA: NEGATIVE
Candida lusitaniae, NAA: NEGATIVE
Candida parapsilosis, NAA: NEGATIVE
Candida tropicalis, NAA: NEGATIVE

## 2017-07-03 NOTE — Pre-Procedure Instructions (Signed)
Bettey Costa, MD  Physician  Internal Medicine  Discharge Summary  Addendum  Date of Service:  06/30/2017 11:31 AM               [] Hide copied text  [] Hover for details   [place] at [place] NAME: Whitney [name]    MR#:  [id number]  DATE OF BIRTH:  15-Jul-1951  DATE OF ADMISSION:  06/29/2017  ADMITTING PHYSICIAN: Loletha Grayer, MD  DATE OF DISCHARGE: 06/30/2017  PRIMARY CARE PHYSICIAN: [facility]    ADMISSION DIAGNOSIS:  M17.12 Unilateral primary osteoarthritis, left knee  DISCHARGE DIAGNOSIS:  Active Problems:   Chest pain   SECONDARY DIAGNOSIS:   Past Medical History:  Diagnosis Date  . Abnormal Pap smear of cervix    Pt states she had colposcopy   . Arthritis   . Chronic kidney disease   . Hx of unilateral nephrectomy 1979   Left Nephrectomy  . Hypertension   . PMB (postmenopausal bleeding)   . Restless leg syndrome   . Vitamin D deficiency     HOSPITAL COURSE:   66 year old female with history of hypertension who presented chest pain.  1. Chest pain: Patient was ruled out for ACS. Her troponins were negative. She underwent cardiac stress testing.   Normal myocardial perfusion without evidence of ischemia or scar.  The left ventricular ejection fraction is moderately decreased (30-44%). Given normal LVEF by echo performed yesterday, LVEF calculation on this study may be artificially low secondary to gating difficulties with frequent PVCs.  This is an intermediate risk study based on moderately reduced LVEF.  I spoke with cardiology and it appears taht due to the PVCS on stress is it causing artifically low EF, however it appears on ECHO she has normal EF and therefore stress is presumed to be low risk.       Cardiac clearance has not been received at [facility] from [facility].  Please see above Stress test and Echo results.

## 2017-07-05 ENCOUNTER — Ambulatory Visit: Payer: Self-pay | Admitting: Orthopedic Surgery

## 2017-07-05 MED ORDER — CEFAZOLIN SODIUM-DEXTROSE 2-4 GM/100ML-% IV SOLN: 2.0000 g | INTRAVENOUS | Status: DC

## 2017-07-05 MED ORDER — TRANEXAMIC ACID 1000 MG/10ML IV SOLN
1000.0000 mg | INTRAVENOUS | Status: DC
  Filled 2017-07-05: qty 10

## 2017-07-06 ENCOUNTER — Ambulatory Visit: Payer: Medicare Other | Admitting: Anesthesiology

## 2017-07-06 ENCOUNTER — Ambulatory Visit: Payer: Medicare Other

## 2017-07-06 ENCOUNTER — Other Ambulatory Visit: Payer: Self-pay

## 2017-07-06 ENCOUNTER — Encounter
Admission: AD | Disposition: A | Discharge status: Skilled Nursing Facility | Payer: Self-pay | Source: Ambulatory Visit | Attending: Orthopedic Surgery

## 2017-07-06 ENCOUNTER — Inpatient Hospital Stay
Admission: AD | Admit: 2017-07-06 | Discharge: 2017-07-08 | DRG: 470 | Disposition: A | Discharge status: Skilled Nursing Facility | Payer: Medicare Other | Source: Ambulatory Visit | Attending: Orthopedic Surgery | Admitting: Orthopedic Surgery

## 2017-07-06 DIAGNOSIS — Z972 Presence of dental prosthetic device (complete) (partial): Secondary | ICD-10-CM | POA: Diagnosis not present

## 2017-07-06 DIAGNOSIS — I129 Hypertensive chronic kidney disease with stage 1 through stage 4 chronic kidney disease, or unspecified chronic kidney disease: Secondary | ICD-10-CM | POA: Diagnosis present

## 2017-07-06 DIAGNOSIS — M1712 Unilateral primary osteoarthritis, left knee: Principal | ICD-10-CM | POA: Diagnosis present

## 2017-07-06 DIAGNOSIS — M25762 Osteophyte, left knee: Secondary | ICD-10-CM | POA: Diagnosis present

## 2017-07-06 DIAGNOSIS — E559 Vitamin D deficiency, unspecified: Secondary | ICD-10-CM | POA: Diagnosis present

## 2017-07-06 DIAGNOSIS — Z79899 Other long term (current) drug therapy: Secondary | ICD-10-CM

## 2017-07-06 DIAGNOSIS — I951 Orthostatic hypotension: Secondary | ICD-10-CM | POA: Diagnosis present

## 2017-07-06 DIAGNOSIS — Z96659 Presence of unspecified artificial knee joint: Secondary | ICD-10-CM

## 2017-07-06 DIAGNOSIS — Z888 Allergy status to other drugs, medicaments and biological substances status: Secondary | ICD-10-CM | POA: Diagnosis not present

## 2017-07-06 DIAGNOSIS — Z905 Acquired absence of kidney: Secondary | ICD-10-CM

## 2017-07-06 DIAGNOSIS — Z6841 Body Mass Index (BMI) 40.0 and over, adult: Secondary | ICD-10-CM

## 2017-07-06 DIAGNOSIS — G2581 Restless legs syndrome: Secondary | ICD-10-CM | POA: Diagnosis present

## 2017-07-06 DIAGNOSIS — N182 Chronic kidney disease, stage 2 (mild): Secondary | ICD-10-CM | POA: Diagnosis present

## 2017-07-06 DIAGNOSIS — Z96652 Presence of left artificial knee joint: Secondary | ICD-10-CM

## 2017-07-06 DIAGNOSIS — K219 Gastro-esophageal reflux disease without esophagitis: Secondary | ICD-10-CM | POA: Diagnosis present

## 2017-07-06 HISTORY — PX: TOTAL KNEE ARTHROPLASTY: SHX125

## 2017-07-06 LAB — PROTIME-INR
INR: 0.94
Prothrombin Time: 12.5 seconds (ref 11.4–15.2)

## 2017-07-06 LAB — URINE DRUG SCREEN, QUALITATIVE (ARMC ONLY)
Amphetamines, Ur Screen: NOT DETECTED
Barbiturates, Ur Screen: NOT DETECTED
Benzodiazepine, Ur Scrn: NOT DETECTED
Cannabinoid 50 Ng, Ur ~~LOC~~: NOT DETECTED
Cocaine Metabolite,Ur ~~LOC~~: NOT DETECTED
MDMA (Ecstasy)Ur Screen: NOT DETECTED
Methadone Scn, Ur: NOT DETECTED
Opiate, Ur Screen: NOT DETECTED
Phencyclidine (PCP) Ur S: NOT DETECTED
Tricyclic, Ur Screen: NOT DETECTED

## 2017-07-06 LAB — TYPE AND SCREEN
ABO/RH(D): B POS
Antibody Screen: NEGATIVE

## 2017-07-06 SURGERY — ARTHROPLASTY, KNEE, TOTAL
Anesthesia: Regional | Laterality: Left | Wound class: Clean

## 2017-07-06 MED ORDER — METOCLOPRAMIDE HCL 10 MG PO TABS: 5.0000 mg | ORAL_TABLET | Freq: Three times a day (TID) | ORAL | Status: DC | PRN

## 2017-07-06 MED ORDER — BUPIVACAINE HCL (PF) 0.5 % IJ SOLN
INTRAMUSCULAR | Status: AC
  Filled 2017-07-06: qty 10

## 2017-07-06 MED ORDER — GABAPENTIN 100 MG PO CAPS
200.0000 mg | ORAL_CAPSULE | Freq: Two times a day (BID) | ORAL | Status: DC
  Administered 2017-07-06 – 2017-07-08 (×4): 200 mg via ORAL
  Filled 2017-07-06 (×4): qty 2

## 2017-07-06 MED ORDER — ONDANSETRON HCL 4 MG/2ML IJ SOLN: 4.0000 mg | Freq: Once | INTRAMUSCULAR | Status: DC | PRN

## 2017-07-06 MED ORDER — METOPROLOL TARTRATE 5 MG/5ML IV SOLN
INTRAVENOUS | Status: DC | PRN
  Administered 2017-07-06 (×2): 1 mg via INTRAVENOUS

## 2017-07-06 MED ORDER — SENNOSIDES-DOCUSATE SODIUM 8.6-50 MG PO TABS: 1.0000 | ORAL_TABLET | Freq: Every evening | ORAL | Status: DC | PRN

## 2017-07-06 MED ORDER — LIDOCAINE HCL (PF) 1 % IJ SOLN
INTRAMUSCULAR | Status: DC | PRN
  Administered 2017-07-06: 5 mL via SUBCUTANEOUS

## 2017-07-06 MED ORDER — SODIUM CHLORIDE 0.9 % IJ SOLN
INTRAMUSCULAR | Status: AC
Start: 2017-07-06 — End: 2017-07-06
  Filled 2017-07-06: qty 50

## 2017-07-06 MED ORDER — MIDAZOLAM HCL 2 MG/2ML IJ SOLN
1.0000 mg | Freq: Once | INTRAMUSCULAR | Status: AC
  Administered 2017-07-06: 1 mg via INTRAVENOUS

## 2017-07-06 MED ORDER — SODIUM CHLORIDE FLUSH 0.9 % IV SOLN
INTRAVENOUS | Status: AC
  Filled 2017-07-06: qty 20

## 2017-07-06 MED ORDER — BUPIVACAINE LIPOSOME 1.3 % IJ SUSP
INTRAMUSCULAR | Status: AC
  Filled 2017-07-06: qty 20

## 2017-07-06 MED ORDER — SODIUM CHLORIDE 0.9 % IV SOLN
INTRAVENOUS | Status: DC | PRN
  Administered 2017-07-06: 60 mL

## 2017-07-06 MED ORDER — LACTATED RINGERS IV SOLN
INTRAVENOUS | Status: AC
  Administered 2017-07-06: 23:00:00 via INTRAVENOUS

## 2017-07-06 MED ORDER — HYDROCODONE-ACETAMINOPHEN 5-325 MG PO TABS
1.0000 | ORAL_TABLET | ORAL | Status: DC | PRN
  Administered 2017-07-06 – 2017-07-08 (×5): 1 via ORAL
  Filled 2017-07-06 (×7): qty 1

## 2017-07-06 MED ORDER — FAMOTIDINE 20 MG PO TABS
20.0000 mg | ORAL_TABLET | Freq: Two times a day (BID) | ORAL | Status: DC
  Administered 2017-07-06 – 2017-07-08 (×4): 20 mg via ORAL
  Filled 2017-07-06 (×4): qty 1

## 2017-07-06 MED ORDER — METOCLOPRAMIDE HCL 5 MG/ML IJ SOLN: 5.0000 mg | Freq: Three times a day (TID) | INTRAMUSCULAR | Status: DC | PRN

## 2017-07-06 MED ORDER — BUPIVACAINE-EPINEPHRINE 0.5% -1:200000 IJ SOLN
INTRAMUSCULAR | Status: DC | PRN
  Administered 2017-07-06: 30 mL

## 2017-07-06 MED ORDER — BISACODYL 10 MG RE SUPP: 10.0000 mg | Freq: Every day | RECTAL | Status: DC | PRN

## 2017-07-06 MED ORDER — CELECOXIB 200 MG PO CAPS
200.0000 mg | ORAL_CAPSULE | Freq: Two times a day (BID) | ORAL | Status: DC
  Administered 2017-07-06: 200 mg via ORAL
  Filled 2017-07-06 (×2): qty 1

## 2017-07-06 MED ORDER — PROPOFOL 10 MG/ML IV BOLUS
INTRAVENOUS | Status: DC | PRN
  Administered 2017-07-06: 50 mg via INTRAVENOUS

## 2017-07-06 MED ORDER — CHLORHEXIDINE GLUCONATE 4 % EX LIQD: 60.0000 mL | Freq: Once | CUTANEOUS | Status: DC

## 2017-07-06 MED ORDER — ACETAMINOPHEN 500 MG PO TABS
ORAL_TABLET | ORAL | Status: AC
  Administered 2017-07-06: 1000 mg via ORAL
  Filled 2017-07-06: qty 2

## 2017-07-06 MED ORDER — HYDROCODONE-ACETAMINOPHEN 10-325 MG PO TABS
2.0000 | ORAL_TABLET | ORAL | Status: DC | PRN
  Administered 2017-07-07: 2 via ORAL
  Filled 2017-07-06: qty 2

## 2017-07-06 MED ORDER — FENTANYL CITRATE (PF) 100 MCG/2ML IJ SOLN
INTRAMUSCULAR | Status: AC
  Administered 2017-07-06: 50 ug via INTRAVENOUS
  Filled 2017-07-06: qty 2

## 2017-07-06 MED ORDER — TRANEXAMIC ACID 1000 MG/10ML IV SOLN: 1000.0000 mg | INTRAVENOUS | Status: DC

## 2017-07-06 MED ORDER — PROPOFOL 500 MG/50ML IV EMUL
INTRAVENOUS | Status: AC
  Filled 2017-07-06: qty 50

## 2017-07-06 MED ORDER — CEFAZOLIN SODIUM-DEXTROSE 2-4 GM/100ML-% IV SOLN
INTRAVENOUS | Status: AC
  Filled 2017-07-06: qty 100

## 2017-07-06 MED ORDER — HYDRALAZINE HCL 10 MG PO TABS
10.0000 mg | ORAL_TABLET | Freq: Three times a day (TID) | ORAL | Status: DC
  Filled 2017-07-06 (×7): qty 1

## 2017-07-06 MED ORDER — PROPOFOL 500 MG/50ML IV EMUL
INTRAVENOUS | Status: DC | PRN
  Administered 2017-07-06: 75 ug/kg/min via INTRAVENOUS

## 2017-07-06 MED ORDER — ACETAMINOPHEN 500 MG PO TABS: 1000.0000 mg | ORAL_TABLET | Freq: Once | ORAL | Status: DC

## 2017-07-06 MED ORDER — OXYCODONE HCL 5 MG PO TABS: 5.0000 mg | ORAL_TABLET | ORAL | Status: DC | PRN

## 2017-07-06 MED ORDER — ASPIRIN 81 MG PO TBEC
81.0000 mg | DELAYED_RELEASE_TABLET | Freq: Every day | ORAL | Status: DC
  Filled 2017-07-06 (×3): qty 1

## 2017-07-06 MED ORDER — AMLODIPINE BESYLATE 10 MG PO TABS
10.0000 mg | ORAL_TABLET | Freq: Every day | ORAL | Status: DC
  Administered 2017-07-08: 10 mg via ORAL
  Filled 2017-07-06: qty 1

## 2017-07-06 MED ORDER — LACTATED RINGERS IV SOLN: INTRAVENOUS | Status: DC

## 2017-07-06 MED ORDER — LACTATED RINGERS IV SOLN
INTRAVENOUS | Status: DC
  Administered 2017-07-06: 13:00:00 via INTRAVENOUS

## 2017-07-06 MED ORDER — ROPIVACAINE HCL 5 MG/ML IJ SOLN
INTRAMUSCULAR | Status: DC | PRN
  Administered 2017-07-06: 30 mL via PERINEURAL

## 2017-07-06 MED ORDER — FENTANYL CITRATE (PF) 100 MCG/2ML IJ SOLN: 25.0000 ug | INTRAMUSCULAR | Status: DC | PRN

## 2017-07-06 MED ORDER — GABAPENTIN 300 MG PO CAPS
300.0000 mg | ORAL_CAPSULE | Freq: Once | ORAL | Status: AC
  Administered 2017-07-06: 300 mg via ORAL

## 2017-07-06 MED ORDER — LIDOCAINE HCL (PF) 2 % IJ SOLN
INTRAMUSCULAR | Status: AC
  Filled 2017-07-06: qty 10

## 2017-07-06 MED ORDER — ERGOCALCIFEROL 1.25 MG (50000 UT) PO CAPS: 50000.0000 [IU] | ORAL_CAPSULE | ORAL | Status: DC

## 2017-07-06 MED ORDER — PHENOL 1.4 % MT LIQD
1.0000 | OROMUCOSAL | Status: DC | PRN
  Filled 2017-07-06: qty 177

## 2017-07-06 MED ORDER — METOPROLOL TARTRATE 5 MG/5ML IV SOLN
INTRAVENOUS | Status: AC
  Filled 2017-07-06: qty 5

## 2017-07-06 MED ORDER — TRANEXAMIC ACID 1000 MG/10ML IV SOLN
INTRAVENOUS | Status: DC | PRN
  Administered 2017-07-06: 1000 mg via INTRAVENOUS

## 2017-07-06 MED ORDER — HYDRALAZINE HCL 25 MG PO TABS
25.0000 mg | ORAL_TABLET | Freq: Three times a day (TID) | ORAL | Status: DC
  Administered 2017-07-06 (×2): 25 mg via ORAL
  Filled 2017-07-06 (×4): qty 1

## 2017-07-06 MED ORDER — ONDANSETRON HCL 4 MG/2ML IJ SOLN
4.0000 mg | Freq: Four times a day (QID) | INTRAMUSCULAR | Status: DC | PRN
  Administered 2017-07-06: 4 mg via INTRAVENOUS
  Filled 2017-07-06: qty 2

## 2017-07-06 MED ORDER — CEFAZOLIN SODIUM-DEXTROSE 2-4 GM/100ML-% IV SOLN
2.0000 g | Freq: Four times a day (QID) | INTRAVENOUS | Status: AC
  Administered 2017-07-06 (×2): 2 g via INTRAVENOUS
  Filled 2017-07-06 (×2): qty 100

## 2017-07-06 MED ORDER — GABAPENTIN 300 MG PO CAPS
ORAL_CAPSULE | ORAL | Status: AC
  Administered 2017-07-06: 300 mg via ORAL
  Filled 2017-07-06: qty 1

## 2017-07-06 MED ORDER — LIDOCAINE HCL (CARDIAC) 20 MG/ML IV SOLN
INTRAVENOUS | Status: DC | PRN
Start: 2017-07-06 — End: 2017-07-06
  Administered 2017-07-06: 40 mg via INTRAVENOUS

## 2017-07-06 MED ORDER — ASPIRIN EC 325 MG PO TBEC
325.0000 mg | DELAYED_RELEASE_TABLET | Freq: Every day | ORAL | Status: DC
  Administered 2017-07-08: 325 mg via ORAL
  Filled 2017-07-06: qty 1

## 2017-07-06 MED ORDER — BACITRACIN 50000 UNITS IM SOLR
INTRAMUSCULAR | Status: AC
Start: 2017-07-06 — End: 2017-07-06
  Filled 2017-07-06: qty 2

## 2017-07-06 MED ORDER — MIDAZOLAM HCL 2 MG/2ML IJ SOLN
INTRAMUSCULAR | Status: AC
  Administered 2017-07-06: 1 mg via INTRAVENOUS
  Filled 2017-07-06: qty 2

## 2017-07-06 MED ORDER — SODIUM CHLORIDE 0.9 % IV SOLN
INTRAVENOUS | Status: DC | PRN
  Administered 2017-07-06: 09:00:00 via INTRAVENOUS

## 2017-07-06 MED ORDER — ONDANSETRON HCL 4 MG PO TABS: 4.0000 mg | ORAL_TABLET | Freq: Four times a day (QID) | ORAL | Status: DC | PRN

## 2017-07-06 MED ORDER — PHENYLEPHRINE HCL 10 MG/ML IJ SOLN
INTRAMUSCULAR | Status: AC
Start: 2017-07-06 — End: 2017-07-06
  Filled 2017-07-06: qty 1

## 2017-07-06 MED ORDER — BUPIVACAINE HCL (PF) 0.5 % IJ SOLN
INTRAMUSCULAR | Status: DC | PRN
  Administered 2017-07-06: 2.5 mL

## 2017-07-06 MED ORDER — ACETAMINOPHEN 500 MG PO TABS
1000.0000 mg | ORAL_TABLET | Freq: Once | ORAL | Status: AC
  Administered 2017-07-06: 1000 mg via ORAL

## 2017-07-06 MED ORDER — GABAPENTIN 600 MG PO TABS
300.0000 mg | ORAL_TABLET | Freq: Once | ORAL | Status: AC
  Administered 2017-07-06: 600 mg via ORAL
  Filled 2017-07-06: qty 0.5

## 2017-07-06 MED ORDER — ROPIVACAINE HCL 5 MG/ML IJ SOLN
INTRAMUSCULAR | Status: AC
  Filled 2017-07-06: qty 30

## 2017-07-06 MED ORDER — ROPINIROLE HCL 1 MG PO TABS
0.5000 mg | ORAL_TABLET | Freq: Every day | ORAL | Status: DC
  Administered 2017-07-06 – 2017-07-07 (×2): 0.5 mg via ORAL
  Filled 2017-07-06 (×2): qty 1

## 2017-07-06 MED ORDER — FLEET ENEMA 7-19 GM/118ML RE ENEM: 1.0000 | ENEMA | Freq: Once | RECTAL | Status: DC | PRN

## 2017-07-06 MED ORDER — BUPIVACAINE-EPINEPHRINE (PF) 0.5% -1:200000 IJ SOLN
INTRAMUSCULAR | Status: AC
  Filled 2017-07-06: qty 30

## 2017-07-06 MED ORDER — FENTANYL CITRATE (PF) 100 MCG/2ML IJ SOLN
50.0000 ug | Freq: Once | INTRAMUSCULAR | Status: AC
  Administered 2017-07-06: 50 ug via INTRAVENOUS

## 2017-07-06 MED ORDER — LIDOCAINE HCL (PF) 1 % IJ SOLN
INTRAMUSCULAR | Status: AC
  Filled 2017-07-06: qty 5

## 2017-07-06 MED ORDER — PHENYLEPHRINE HCL 10 MG/ML IJ SOLN
INTRAMUSCULAR | Status: DC | PRN
  Administered 2017-07-06: 50 ug via INTRAVENOUS

## 2017-07-06 MED ORDER — PROPOFOL 500 MG/50ML IV EMUL
INTRAVENOUS | Status: AC
Start: 2017-07-06 — End: 2017-07-06
  Filled 2017-07-06: qty 50

## 2017-07-06 MED ORDER — CARVEDILOL 3.125 MG PO TABS
3.1250 mg | ORAL_TABLET | Freq: Two times a day (BID) | ORAL | Status: DC
  Administered 2017-07-06 – 2017-07-08 (×2): 3.125 mg via ORAL
  Filled 2017-07-06 (×2): qty 1

## 2017-07-06 MED ORDER — FLUCONAZOLE 50 MG PO TABS
150.0000 mg | ORAL_TABLET | ORAL | Status: DC
  Filled 2017-07-06: qty 3

## 2017-07-06 MED ORDER — DOCUSATE SODIUM 100 MG PO CAPS
100.0000 mg | ORAL_CAPSULE | Freq: Two times a day (BID) | ORAL | Status: DC
  Administered 2017-07-06 – 2017-07-08 (×3): 100 mg via ORAL
  Filled 2017-07-06 (×4): qty 1

## 2017-07-06 MED ORDER — ACETAMINOPHEN 500 MG PO TABS
1000.0000 mg | ORAL_TABLET | Freq: Four times a day (QID) | ORAL | Status: AC
  Administered 2017-07-06 (×2): 1000 mg via ORAL
  Filled 2017-07-06 (×3): qty 2

## 2017-07-06 MED ORDER — MENTHOL 3 MG MT LOZG
1.0000 | LOZENGE | OROMUCOSAL | Status: DC | PRN
  Filled 2017-07-06: qty 9

## 2017-07-06 MED ORDER — SENNA 8.6 MG PO TABS
1.0000 | ORAL_TABLET | Freq: Two times a day (BID) | ORAL | Status: DC
  Administered 2017-07-06 – 2017-07-08 (×4): 8.6 mg via ORAL
  Filled 2017-07-06 (×5): qty 1

## 2017-07-06 MED ORDER — LACTATED RINGERS IV SOLN
INTRAVENOUS | Status: DC
  Administered 2017-07-06: 07:00:00 via INTRAVENOUS

## 2017-07-06 MED ORDER — CEFAZOLIN SODIUM-DEXTROSE 2-4 GM/100ML-% IV SOLN
2.0000 g | Freq: Once | INTRAVENOUS | Status: AC
  Administered 2017-07-06: 2 g via INTRAVENOUS

## 2017-07-06 MED ORDER — MORPHINE SULFATE (PF) 2 MG/ML IV SOLN
1.0000 mg | INTRAVENOUS | Status: DC | PRN
  Administered 2017-07-06: 1 mg via INTRAVENOUS
  Filled 2017-07-06: qty 1

## 2017-07-06 SURGICAL SUPPLY — 54 items
BASIN GRAD PLASTIC 32OZ STRL (MISCELLANEOUS) ×2 IMPLANT
BLADE SAW 1 (BLADE) ×2 IMPLANT
BLADE SAW 1/2 (BLADE) ×2 IMPLANT
BOWL CEMENT MIX W/ADAPTER (MISCELLANEOUS) ×2 IMPLANT
BRUSH SCRUB EZ  4% CHG (MISCELLANEOUS) ×2
BRUSH SCRUB EZ 4% CHG (MISCELLANEOUS) ×2 IMPLANT
CANISTER SUCT 1200ML W/VALVE (MISCELLANEOUS) ×2 IMPLANT
CANISTER SUCT 3000ML PPV (MISCELLANEOUS) ×4 IMPLANT
CAP KNEE TOTAL 3 SIGMA ×2 IMPLANT
CEMENT BONE SIMPLEX SPEEDSET (Cement) ×4 IMPLANT
CHLORAPREP W/TINT 26ML (MISCELLANEOUS) ×4 IMPLANT
COOLER POLAR GLACIER W/PUMP (MISCELLANEOUS) ×2 IMPLANT
CUFF TOURN 30 STER DUAL PORT (MISCELLANEOUS) IMPLANT
CUFF TOURN 34 STER (MISCELLANEOUS) ×2 IMPLANT
DRAPE INCISE IOBAN 66X60 STRL (DRAPES) ×2 IMPLANT
DRAPE SHEET LG 3/4 BI-LAMINATE (DRAPES) ×2 IMPLANT
DRAPE U-SHAPE 47X51 STRL (DRAPES) ×2 IMPLANT
DRSG AQUACEL AG ADV 3.5X10 (GAUZE/BANDAGES/DRESSINGS) ×2 IMPLANT
ELECT REM PT RETURN 9FT ADLT (ELECTROSURGICAL) ×2
ELECTRODE REM PT RTRN 9FT ADLT (ELECTROSURGICAL) ×1 IMPLANT
GAUZE PETRO XEROFOAM 1X8 (MISCELLANEOUS) ×2 IMPLANT
GAUZE SPONGE 4X4 12PLY STRL (GAUZE/BANDAGES/DRESSINGS) ×2 IMPLANT
GLOVE INDICATOR 8.0 STRL GRN (GLOVE) ×2 IMPLANT
GLOVE SURG ORTHO 8.0 STRL STRW (GLOVE) ×6 IMPLANT
GOWN STRL REUS W/ TWL LRG LVL3 (GOWN DISPOSABLE) ×1 IMPLANT
GOWN STRL REUS W/ TWL XL LVL3 (GOWN DISPOSABLE) ×1 IMPLANT
GOWN STRL REUS W/TWL LRG LVL3 (GOWN DISPOSABLE) ×1
GOWN STRL REUS W/TWL XL LVL3 (GOWN DISPOSABLE) ×1
GUIDEPIN TRUMATCH LEFT (PIN) ×2 IMPLANT
HOOD PEEL AWAY FLYTE STAYCOOL (MISCELLANEOUS) ×6 IMPLANT
IV NS 1000ML (IV SOLUTION) ×1
IV NS 1000ML BAXH (IV SOLUTION) ×1 IMPLANT
KIT RM TURNOVER STRD PROC AR (KITS) ×2 IMPLANT
NDL SAFETY ECLIPSE 18X1.5 (NEEDLE) ×1 IMPLANT
NEEDLE HYPO 18GX1.5 SHARP (NEEDLE) ×1
NEEDLE SPNL 20GX3.5 QUINCKE YW (NEEDLE) ×2 IMPLANT
NS IRRIG 1000ML POUR BTL (IV SOLUTION) ×2 IMPLANT
PACK TOTAL KNEE (MISCELLANEOUS) ×2 IMPLANT
PAD DE MAYO PRESSURE PROTECT (MISCELLANEOUS) ×2 IMPLANT
PAD WRAPON POLAR KNEE (MISCELLANEOUS) ×1 IMPLANT
PULSAVAC PLUS IRRIG FAN TIP (DISPOSABLE) ×4
STAPLER SKIN PROX 35W (STAPLE) ×2 IMPLANT
SUCTION FRAZIER HANDLE 10FR (MISCELLANEOUS) ×1
SUCTION TUBE FRAZIER 10FR DISP (MISCELLANEOUS) ×1 IMPLANT
SUT DVC 2 QUILL PDO  T11 36X36 (SUTURE) ×1
SUT DVC 2 QUILL PDO T11 36X36 (SUTURE) ×1 IMPLANT
SUT VIC AB 2-0 CT1 18 (SUTURE) ×2 IMPLANT
SUT VIC AB 2-0 CT1 27 (SUTURE) ×1
SUT VIC AB 2-0 CT1 TAPERPNT 27 (SUTURE) ×1 IMPLANT
SUT VIC AB PLUS 45CM 1-MO-4 (SUTURE) ×2 IMPLANT
SYR 30ML LL (SYRINGE) ×6 IMPLANT
TIP FAN IRRIG PULSAVAC PLUS (DISPOSABLE) ×2 IMPLANT
TRAY FOLEY W/METER SILVER 16FR (SET/KITS/TRAYS/PACK) IMPLANT
WRAPON POLAR PAD KNEE (MISCELLANEOUS) ×2

## 2017-07-06 NOTE — Evaluation (Signed)
Physical Therapy Evaluation Patient Details Name: Whitney [name] MRN: [id number] DOB: 04-10-1951 Today's Date: 07/06/2017   History of Present Illness  admitted for acute hospitalization status post L TKR (07/06/17), WBAT.  Clinical Impression  Upon evaluation, patient alert and oriented; follows all commands and demonstrates fair/good effort with all functional tasks.  Generally limited by pain, rated 8/10; meds requested during session.  Demonstrates guarded L knee strength (3-/5)  and ROM (10-60 degrees); limited quad activation and knee control noted due to pain.  Currently requiring mod assist for supine to sit; close sup for unsupported sitting balance; max assist +2 for return to supine.  Significant pain, onset of nausea/vomiting during sitting attempts (RN informed/aware); unable to tolerate additional mobility or OOB attempts as result.   Will continue mobility assessment and progression next session as appropriate; anticipate significant assist required for all functional mobility. Would benefit from skilled PT to address above deficits and promote optimal return to PLOF; recommend transition to STR upon discharge from acute hospitalization.     Follow Up Recommendations SNF    Equipment Recommendations       Recommendations for Other Services       Precautions / Restrictions Restrictions Weight Bearing Restrictions: Yes LLE Weight Bearing: Weight bearing as tolerated      Mobility  Bed Mobility Overal bed mobility: Needs Assistance Bed Mobility: Supine to Sit;Sit to Supine     Supine to sit: Mod assist Sit to supine: Max assist;+2 for physical assistance      Transfers                 General transfer comment: unable to tolerate due to pain, nausea/vomiting  Ambulation/Gait             General Gait Details: unable to tolerate due to pain, nausea/vomiting  Stairs            Wheelchair Mobility    Modified Rankin (Stroke Patients Only)        Balance Overall balance assessment: Needs assistance Sitting-balance support: No upper extremity supported Sitting balance-Leahy Scale: Good                                       Pertinent Vitals/Pain Pain Assessment: 0-10 Pain Score: 8  Pain Location: L knee Pain Descriptors / Indicators: Aching;Grimacing;Guarding Pain Intervention(s): Limited activity within patient's tolerance;Monitored during session;Repositioned    Home Living Family/patient expects to be discharged to:: Private residence Living Arrangements: Alone   Type of Home: House       Home Layout: One level Home Equipment: Cane - single point      Prior Function Level of Independence: Independent with assistive device(s)         Comments: Mod indep with SPC for ADLs, household and community mobilization; does endorse multiple fall history due to LEs "giving out"     Hand Dominance        Extremity/Trunk Assessment   Upper Extremity Assessment Upper Extremity Assessment: Overall WFL for tasks assessed    Lower Extremity Assessment Lower Extremity Assessment: Generalized weakness(L knee grossly 3-/5, limited by pain; otherwise, grossly WFL.  Sensation fully returned/intact)       Communication   Communication: No difficulties  Cognition Arousal/Alertness: Awake/alert Behavior During Therapy: WFL for tasks assessed/performed Overall Cognitive Status: Within Functional Limits for tasks assessed  General Comments      Exercises Total Joint Exercises Goniometric ROM: L knee: 10-60 degrees, limited by pain Other Exercises Other Exercises: Supine L LE therex, 1x10, act/act assist ROM: ankle pumps, quad sets, SAQs, hip abduct/adduct.  Limited quad activation, very guarded due to pain.   Assessment/Plan    PT Assessment Patient needs continued PT services  PT Problem List Decreased strength;Decreased range of  motion;Decreased activity tolerance;Decreased balance;Decreased mobility;Decreased cognition;Obesity;Pain       PT Treatment Interventions DME instruction;Gait training;Stair training;Functional mobility training;Therapeutic activities;Therapeutic exercise;Balance training;Patient/family education    PT Goals (Current goals can be found in the Care Plan section)  Acute Rehab PT Goals Patient Stated Goal: to get stronger and do rehab at the [facility] place again PT Goal Formulation: With patient Time For Goal Achievement: 07/20/17    Frequency BID   Barriers to discharge Decreased caregiver support      Co-evaluation               AM-PAC PT "6 Clicks" Daily Activity  Outcome Measure Difficulty turning over in bed (including adjusting bedclothes, sheets and blankets)?: Unable Difficulty moving from lying on back to sitting on the side of the bed? : Unable Difficulty sitting down on and standing up from a chair with arms (e.g., wheelchair, bedside commode, etc,.)?: Unable Help needed moving to and from a bed to chair (including a wheelchair)?: A Lot Help needed walking in hospital room?: A Lot Help needed climbing 3-5 steps with a railing? : Total 6 Click Score: 8    End of Session Equipment Utilized During Treatment: Gait belt Activity Tolerance: Patient limited by pain Patient left: in bed;with bed alarm set;with nursing/sitter in room Nurse Communication: Mobility status PT Visit Diagnosis: Difficulty in walking, not elsewhere classified (R26.2);Muscle weakness (generalized) (M62.81);Pain Pain - Right/Left: Left Pain - part of body: Knee    Time: 1535-1650(total time not charged; IV team in during session for IV placement) PT Time Calculation (min) (ACUTE ONLY): 75 min   Charges:   PT Evaluation $PT Eval Moderate Complexity: 1 Mod PT Treatments $Therapeutic Exercise: 8-22 mins $Therapeutic Activity: 8-22 mins   PT G Codes:   PT G-Codes **NOT FOR INPATIENT  CLASS** Functional Assessment Tool Used: AM-PAC 6 Clicks Basic Mobility Functional Limitation: Mobility: Walking and moving around Mobility: Walking and Moving Around Current Status (D9741): At least 60 percent but less than 80 percent impaired, limited or restricted Mobility: Walking and Moving Around Goal Status [phone]): At least 1 percent but less than 20 percent impaired, limited or restricted    Shawni Volkov H. Owens Shark, PT, DPT, NCS 07/06/17, 10:42 PM [phone]

## 2017-07-06 NOTE — NC FL2 (Signed)
[place] LEVEL OF CARE SCREENING TOOL     IDENTIFICATION  Patient Name: Whitney [name] Birthdate: [birth year] Sex: female Admission Date (Current Location): 07/06/2017  [facility] and [place] Number:  Selena Lesser ([id number]) Facility and Address:  [facility], [address], [place], [place] [address]      Provider Number: [id number]  Attending Physician Name and Address:  Lovell Sheehan, MD  Relative Name and Phone Number:       Current Level of Care: Hospital Recommended Level of Care: [place] Prior Approval Number:    Date Approved/Denied:   PASRR Number: [id number])  Discharge Plan: SNF    Current Diagnoses: Patient Active Problem List   Diagnosis Date Noted  . Total knee replacement status, left 07/06/2017  . Chest pain 06/29/2017  . Postop check 02/25/2017  . Obesity (BMI 35.0-39.9 without comorbidity) 07/15/2016  . Endometrial polyp 07/15/2016  . Class 3 obesity due to excess calories with body mass index (BMI) of 50.0 to 59.9 in adult 07/15/2016  . Family history of breast cancer in first degree relative 07/15/2016  . Family history of ovarian cancer 07/15/2016    Orientation RESPIRATION BLADDER Height & Weight     Self, Time, Situation, Place  Normal Continent Weight: 249 lb (112.9 kg) Height:  5\' 5"  (165.1 cm)  BEHAVIORAL SYMPTOMS/MOOD NEUROLOGICAL BOWEL NUTRITION STATUS      Continent Diet(Diet: Heart Healthy )  AMBULATORY STATUS COMMUNICATION OF NEEDS Skin   Extensive Assist Verbally Surgical wounds(Incision: Left Knee. )                       Personal Care Assistance Level of Assistance  Bathing, Feeding, Dressing Bathing Assistance: Limited assistance Feeding assistance: Independent Dressing Assistance: Limited assistance     Functional Limitations Info  Sight, Hearing, Speech Sight Info: Impaired Hearing Info: Adequate Speech Info: Adequate    SPECIAL CARE FACTORS FREQUENCY   PT (By licensed PT), OT (By licensed OT)     PT Frequency: (5) OT Frequency: (5)            Contractures      Additional Factors Info  Code Status, Allergies Code Status Info: (Full Code. ) Allergies Info: (Enalapril, Lisinopril)           Current Medications (07/06/2017):  This is the current hospital active medication list Current Facility-Administered Medications  Medication Dose Route Frequency Provider Last Rate Last Dose  . acetaminophen (TYLENOL) tablet 1,000 mg  1,000 mg Oral Q6H Lovell Sheehan, MD   1,000 mg at 07/06/17 1348  . [START ON 07/07/2017] amLODipine (NORVASC) tablet 10 mg  10 mg Oral Daily Lovell Sheehan, MD      . Derrill Memo ON 07/07/2017] aspirin EC tablet 325 mg  325 mg Oral Q breakfast Lovell Sheehan, MD      . aspirin EC tablet 81 mg  81 mg Oral Daily Lovell Sheehan, MD      . bisacodyl (DULCOLAX) suppository 10 mg  10 mg Rectal Daily PRN Lovell Sheehan, MD      . carvedilol (COREG) tablet 3.125 mg  3.125 mg Oral BID WC Lovell Sheehan, MD      . ceFAZolin (ANCEF) IVPB 2g/100 mL premix  2 g Intravenous Q6H Lovell Sheehan, MD   Stopped at 07/06/17 1455  . celecoxib (CELEBREX) capsule 200 mg  200 mg Oral Q12H Lovell Sheehan, MD      . docusate sodium (  COLACE) capsule 100 mg  100 mg Oral BID Lovell Sheehan, MD      . Derrill Memo ON 07/12/2017] ergocalciferol (VITAMIN D2) capsule 50,000 Units  50,000 Units Oral Q Trilby Leaver, Elyn Aquas, MD      . famotidine (PEPCID) tablet 20 mg  20 mg Oral BID Lovell Sheehan, MD      . fluconazole (DIFLUCAN) tablet 150 mg  150 mg Oral Q3 days Lovell Sheehan, MD      . gabapentin (NEURONTIN) capsule 200 mg  200 mg Oral BID Lovell Sheehan, MD      . hydrALAZINE (APRESOLINE) tablet 10 mg  10 mg Oral TID Lovell Sheehan, MD      . hydrALAZINE (APRESOLINE) tablet 25 mg  25 mg Oral TID Lovell Sheehan, MD      . HYDROcodone-acetaminophen [facility]) 10-325 MG per tablet 2 tablet  2 tablet Oral Q4H PRN Lovell Sheehan, MD      .  HYDROcodone-acetaminophen (NORCO/VICODIN) 5-325 MG per tablet 1 tablet  1 tablet Oral Q4H PRN Lovell Sheehan, MD      . lactated ringers infusion   Intravenous Continuous Lovell Sheehan, MD 10 mL/hr at 07/06/17 1253    . menthol-cetylpyridinium (CEPACOL) lozenge 3 mg  1 lozenge Oral PRN Lovell Sheehan, MD       Or  . phenol (CHLORASEPTIC) mouth spray 1 spray  1 spray Mouth/Throat PRN Lovell Sheehan, MD      . metoCLOPramide (REGLAN) tablet 5-10 mg  5-10 mg Oral Q8H PRN Lovell Sheehan, MD       Or  . metoCLOPramide (REGLAN) injection 5-10 mg  5-10 mg Intravenous Q8H PRN Lovell Sheehan, MD      . morphine 2 MG/ML injection 1 mg  1 mg Intravenous Q2H PRN Lovell Sheehan, MD   1 mg at 07/06/17 1155  . ondansetron (ZOFRAN) tablet 4 mg  4 mg Oral Q6H PRN Lovell Sheehan, MD       Or  . ondansetron [facility]) injection 4 mg  4 mg Intravenous Q6H PRN Lovell Sheehan, MD      . rOPINIRole (REQUIP) tablet 0.5 mg  0.5 mg Oral QHS Lovell Sheehan, MD      . senna [facility]) tablet 8.6 mg  1 tablet Oral BID Lovell Sheehan, MD      . senna-docusate (Senokot-S) tablet 1 tablet  1 tablet Oral QHS PRN Lovell Sheehan, MD      . sodium phosphate (FLEET) 7-19 GM/118ML enema 1 enema  1 enema Rectal Once PRN Lovell Sheehan, MD         Discharge Medications: Please see discharge summary for a list of discharge medications.  Relevant Imaging Results:  Relevant Lab Results:   Additional Information (SSN: [id number])  Paublo Warshawsky, Veronia Beets, LCSW

## 2017-07-06 NOTE — Transfer of Care (Signed)
Immediate Anesthesia Transfer of Care Note  Patient: Whitney [name]  Procedure(s) Performed: TOTAL KNEE ARTHROPLASTY (Left )  Patient Location: PACU  Anesthesia Type:Spinal  Level of Consciousness: awake, alert  and oriented  Airway & Oxygen Therapy: Patient Spontanous Breathing and Patient connected to nasal cannula oxygen  Post-op Assessment: Report given to RN and Post -op Vital signs reviewed and stable  Post vital signs: Reviewed and stable  Last Vitals:  Vitals:   07/06/17 0743 07/06/17 1020  BP: 137/88 (!) 143/94  Pulse: 70 60  Resp: 16 18  Temp:    SpO2: 100% 99%    Last Pain:  Vitals:   07/06/17 0743  TempSrc:   PainSc: 0-No pain         Complications: No apparent anesthesia complications

## 2017-07-06 NOTE — Anesthesia Preprocedure Evaluation (Addendum)
Anesthesia Evaluation  Patient identified by MRN, date of birth, ID band Patient awake    Reviewed: Allergy & Precautions, NPO status , Patient's Chart, lab work & pertinent test results  History of Anesthesia Complications Negative for: history of anesthetic complications  Airway Mallampati: III       Dental  (+) Partial Upper, Dental Advidsory Given   Pulmonary neg pulmonary ROS,     + decreased breath sounds      Cardiovascular Exercise Tolerance: Good hypertension, Pt. on medications and Pt. on home beta blockers (-) angina(-) CAD, (-) Past MI, (-) Cardiac Stents and (-) CABG (-) dysrhythmias (-) Valvular Problems/Murmurs Rhythm:Regular     Neuro/Psych Restless legs negative neurological ROS  negative psych ROS   GI/Hepatic Neg liver ROS, GERD  ,  Endo/Other  neg diabetesMorbid obesity  Renal/GU Renal InsufficiencyRenal diseaseS/p nephrectomy  negative genitourinary   Musculoskeletal  (+) Arthritis , Osteoarthritis,    Abdominal (+) + obese,   Peds negative pediatric ROS (+)  Hematology negative hematology ROS (+)   Anesthesia Other Findings Past Medical History: No date: Abnormal Pap smear of cervix     Comment:  Pt states she had colposcopy  No date: Arthritis No date: Chronic kidney disease 1979: Hx of unilateral nephrectomy     Comment:  Left Nephrectomy No date: Hypertension No date: PMB (postmenopausal bleeding) No date: Restless leg syndrome No date: Vitamin D deficiency   Reproductive/Obstetrics                             Anesthesia Physical  Anesthesia Plan  ASA: III  Anesthesia Plan: Spinal   Post-op Pain Management:  Regional for Post-op pain   Induction: Intravenous  PONV Risk Score and Plan: 2 and Propofol infusion  Airway Management Planned: Nasal Cannula  Additional Equipment:   Intra-op Plan:   Post-operative Plan:   Informed Consent: I have  reviewed the patients History and Physical, chart, labs and discussed the procedure including the risks, benefits and alternatives for the proposed anesthesia with the patient or authorized representative who has indicated his/her understanding and acceptance.     Plan Discussed with: CRNA  Anesthesia Plan Comments:        Anesthesia Quick Evaluation

## 2017-07-06 NOTE — Anesthesia Post-op Follow-up Note (Signed)
Anesthesia QCDR form completed.        

## 2017-07-06 NOTE — Progress Notes (Addendum)
Pt resting comfortably in bed. Blood pressure back WDL. Pt denies feeling like she is not going to faint.

## 2017-07-06 NOTE — Anesthesia Procedure Notes (Signed)
Anesthesia Regional Block: Adductor canal block   Pre-Anesthetic Checklist: ,, timeout performed, Correct Patient, Correct Site, Correct Laterality, Correct Procedure, Correct Position, site marked, Risks and benefits discussed,  Surgical consent,  Pre-op evaluation,  At surgeon's request and post-op pain management  Laterality: Lower and Left  Prep: chloraprep       Needles:  Injection technique: Single-shot  Needle Type: Echogenic Needle     Needle Length: 9cm  Needle Gauge: 21     Additional Needles:   Procedures:,,,, ultrasound used (permanent image in chart),,,,  Narrative:  Start time: 07/06/2017 7:35 AM End time: 07/06/2017 7:40 AM Injection made incrementally with aspirations every 5 mL.  Performed by: Personally  Anesthesiologist: Martha Clan, MD  Additional Notes: Functioning IV was confirmed and monitors were applied.  A echogenic needle was used. Sterile prep,hand hygiene and sterile gloves were used. Minimal sedation used for procedure.   No paresthesia endorsed by patient during the procedure.  Negative aspiration and negative test dose prior to incremental administration of local anesthetic. The patient tolerated the procedure well with no immediate complications.

## 2017-07-06 NOTE — Anesthesia Procedure Notes (Signed)
Spinal  Patient location during procedure: OR End time: 07/06/2017 7:58 AM Staffing Anesthesiologist: Martha Clan, MD Resident/CRNA: Jonna Clark, CRNA Performed: resident/CRNA  Preanesthetic Checklist Completed: patient identified, site marked, surgical consent, pre-op evaluation, timeout performed, IV checked, risks and benefits discussed and monitors and equipment checked Spinal Block Patient position: sitting Prep: Betadine Patient monitoring: heart rate, continuous pulse ox and blood pressure Approach: midline Location: L4-5 Injection technique: single-shot Needle Needle type: Whitacre and Introducer  Needle gauge: 24 G Needle length: 9 cm Assessment Sensory level: T8 Additional Notes Negative paresthesia. Negative blood return. Positive free-flowing CSF. Expiration date of kit checked and confirmed. Patient tolerated procedure well, without complications.

## 2017-07-06 NOTE — H&P (Signed)
The patient has been re-examined, and the chart reviewed, and there have been no interval changes to the documented history and physical.  Plan a left total knee arthroplasty today.  Anesthesia is consulted regarding a peripheral nerve block for post-operative pain.  The risks, benefits, and alternatives have been discussed at length, and the patient is willing to proceed.

## 2017-07-06 NOTE — Progress Notes (Signed)
Pt got up to bedside commode to have bowel movement and void. When trying to get to bed pt stated that she was feeling nausiated and fainted. Blood pressure checked and found to be 53/38. Dr. Sabra Heck contacted and order Lactated Ringers to run at 188ml/hr for six hours. And to place pt in Trendelenburg position with her feet up in the air.

## 2017-07-06 NOTE — Op Note (Signed)
DATE OF SURGERY:  07/06/2017 TIME: 10:28 AM  PATIENT NAME:  Whitney [name]   AGE: 66 y.o.    PRE-OPERATIVE DIAGNOSIS:  M17.12 Unilateral primary osteoarthritis, left knee  POST-OPERATIVE DIAGNOSIS:  Same  PROCEDURE:  Procedure(s): TOTAL KNEE ARTHROPLASTY  SURGEON:  Lovell Sheehan, MD   ASSISTANT:  Wyatt Portela, PA-C  OPERATIVE IMPLANTS: Depuy Sigma, Cruciate Retaining Femoral component size  4N, Sigma Fixed Bearing Tray size 3, Patella polyethylene 3-peg oval button size 35 mm, with a 15 mm polyethylene Curve-Plus insert.   PREOPERATIVE INDICATIONS:  Haddie Bruhl is an 66 y.o. female who has a diagnosis of <principal problem not specified> and elected for a left total knee arthroplasty after failing nonoperative treatment, including activity modification, pain medication, physical therapy and injections who has significant impairment of their activities of daily living.  Radiographs have demonstrated tricompartmental osteoarthritis joint space narrowing, osteophytes, subchondral sclerosis and cyst formation.  The risks, benefits, and alternatives were discussed at length including but not limited to the risks of infection, bleeding, nerve or blood vessel injury, knee stiffness, fracture, dislocation, loosening or failure of the hardware and the need for further surgery. Medical risks include but not limited to DVT and pulmonary embolism, myocardial infarction, stroke, pneumonia, respiratory failure and death. I discussed these risks with the patient in my office prior to the date of surgery. They understood these risks and were willing to proceed.  OPERATIVE FINDINGS AND UNIQUE ASPECTS OF THE CASE:  All three compartments with advanced and severe degenerative changes, large osteophytes and an abundance of synovial fluid. Significant deformity was also noted. A decision was made to proceed with total knee arthroplasty.   OPERATIVE DESCRIPTION:  The patient was brought to the operative room  and placed in a supine position after undergoing placement of a general anesthetic. IV antibiotics were given. Patient received tranexamic acid. The lower extremity was prepped and draped in the usual sterile fashion.  A time out was performed to verify the patient's name, date of birth, medical record number, correct site of surgery and correct procedure to be performed. The timeout was also used to confirm the patient received antibiotics and that appropriate instruments, implants and radiographs studies were available in the room.  The leg was elevated and exsanguinated with an Esmarch and the tourniquet was inflated to 275 mmHg.  A midline incision was made over the left knee.. A medial parapatellar arthrotomy was then made and the patella subluxed laterally and the knee was brought into 90 of flexion. Hoffa's fat pad along with the anterior cruciate ligament was resected and the medial joint line was exposed.  Attention was then turned to preparation of the patella. The thickness of the patella was measured with a caliper, the diameter measured with the patella templates.  The patella resection was then made with an oscillating saw using the patella cutting guide.  The 35 mm button fit appropriately.  3 peg holes for the patella component were then drilled.  The extramedullary tibial cutting guide was then placed using the anterior tibial crest and second ray of the foot as a reference.  The tibial cutting guide was adjusted to allow for appropriate posterior slope.  The tibial cutting block was pinned into position. The slotted stylus was used to measure the proximal tibial resection of 2 mm off the low medial side. Care was taken during the tibial resection to protect the medial and collateral ligaments.  The resected tibial bone was removed.  The distal femur was  resected using the TruMatch cutting guide.  Care was taken to protect the collateral ligaments during distal femoral resection.  The  distal femoral resection was performed with an oscillating saw. The femoral cutting guide was then removed. Extension gap was measured with a 15 mm spacer block and alignment and extension was confirmed using a long alignment rod. The femur was sized to be a 4. Rotation of the referencing guide was checked with the epicondylar axis and Whitesides line. Then the 4-in-1 cutting jig was then applied to the distal femur. A stylus was used to confirm that the anterior femur would not be notched.   Then the anterior, posterior and chamfer femoral cuts were then made with an oscillating saw.  All posterior osteophytes were removed.  The flexion gap was then measured with a flexion spacer block and long alignment rod and was found to be symmetric with the extension gap and perpendicular to mechanical axis of the tibia.  The proximal tibia plateau was then sized with trial trays. The best coverage was achieved with a size 3. This tibial tray was then pinned into position. The proximal tibia was then prepared with the reamer and keel punch.  After tibial preparation was completed, all trial components were inserted with polyethylene trials.  The knee was found to have excellent balance and full motion with a size 15 mm tibial polyethylene insert..    The trials were then placed. Knee was taken through a full range of motion and deemed to be stable with the trial components. All trial components were then removed.  The joint was copiously irrigated with pulse lavage.  The final total knee arthroplasty components were then cemented into place. The knee was held in extension while cement was allowed to cure.The knee was taken through a range of motion and the patella tracked well and the knee was again irrigated copiously.  The knee capsule was then injected with Exparel.  The medial arthrotomy was closed with #1 Vicryl and #2 Quill. The subcutaneous tissue closed with  2-0 vicryl, and skin approximated with staples.  A  dry sterile and compressive dressing was applied.  A Polar Care was applied to the operative knee.  The patient was awakened and brought to the PACU in stable and satisfactory condition.  All sharp, lap and instrument counts were correct at the conclusion the case. I spoke with the patient's family in the postop consultation room to let them know the case had been performed without complication and the patient was stable in recovery room.   Total tourniquet time was 61 minutes.

## 2017-07-07 ENCOUNTER — Encounter: Payer: Self-pay | Admitting: Orthopedic Surgery

## 2017-07-07 LAB — BASIC METABOLIC PANEL
Anion gap: 7 (ref 5–15)
BUN: 35 mg/dL — ABNORMAL HIGH (ref 6–20)
CO2: 21 mmol/L — ABNORMAL LOW (ref 22–32)
Calcium: 8.3 mg/dL — ABNORMAL LOW (ref 8.9–10.3)
Chloride: 109 mmol/L (ref 101–111)
Creatinine, Ser: 1.37 mg/dL — ABNORMAL HIGH (ref 0.44–1.00)
GFR calc Af Amer: 45 mL/min — ABNORMAL LOW (ref >60)
GFR calc non Af Amer: 39 mL/min — ABNORMAL LOW (ref >60)
Glucose, Bld: 121 mg/dL — ABNORMAL HIGH (ref 65–99)
Potassium: 4.5 mmol/L (ref 3.5–5.1)
Sodium: 137 mmol/L (ref 135–145)

## 2017-07-07 LAB — CBC
HCT: 35.9 % (ref 35.0–47.0)
Hemoglobin: 11.9 g/dL — ABNORMAL LOW (ref 12.0–16.0)
MCH: 29.6 pg (ref 26.0–34.0)
MCHC: 33.2 g/dL (ref 32.0–36.0)
MCV: 89.1 fL (ref 80.0–100.0)
Platelets: 239 10*3/uL (ref 150–440)
RBC: 4.03 MIL/uL (ref 3.80–5.20)
RDW: 13.4 % (ref 11.5–14.5)
WBC: 8 10*3/uL (ref 3.6–11.0)

## 2017-07-07 MED ORDER — SODIUM CHLORIDE 0.9 % IV BOLUS (SEPSIS): 1000.0000 mL | Freq: Once | INTRAVENOUS | Status: DC

## 2017-07-07 NOTE — Clinical Social Work Note (Signed)
Clinical Social Work Assessment  Patient Details  Name: Whitney [name] MRN: [id number] Date of Birth: 11/01/1950  Date of referral:  07/07/17               Reason for consult:  Facility Placement                Permission sought to share information with:  Chartered certified accountant granted to share information::  Yes, Verbal Permission Granted  Name::      [place]::   [place]   Relationship::     Contact Information:     Housing/Transportation Living arrangements for the past 2 months:  [place] of Information:  Patient Patient Interpreter Needed:    Criminal Activity/Legal Involvement Pertinent to Current Situation/Hospitalization:  No - Comment as needed Significant Relationships:  Other Family Members Lives with:  Self Do you feel safe going back to the place where you live?  Yes Need for family participation in patient care:  Yes (Comment)  Care giving concerns:  Patient lives alone in [place].    Social Worker assessment / plan:  Holiday representative (CSW) received SNF consult. PT is recommending SNF. CSW met with patient alone at bedside to discuss D/C plan. Patient was alert and oriented X4 and was laying in the bed. CSW introduced self and explained role of CSW department. Patient reported that she is currently living alone. Per patient one of her cousins was staying with her however that cousin has left and will be gone for several months. CSW explained SNF process. Patient is agreeable to SNF search in [facility]. FL2 complete and faxed out.   CSW presented bed offers to patient. She chose [facility]. Patient is agreeable to a semi-private room at [facility]. [facility] admissions coordinator at [facility] is aware of accepted bed offer. CSW will continue to follow and assist as needed.   Employment status:  Retired Nurse, adult PT Recommendations:  [place] / Referral to community resources:  [place]  Patient/Family's Response to care:  Patient is agreeable to go to [facility] for rehab.   Patient/Family's Understanding of and Emotional Response to Diagnosis, Current Treatment, and Prognosis:  Patient was very pleasant and thanked CSW for assistance.   Emotional Assessment Appearance:  Appears stated age Attitude/Demeanor/Rapport:    Affect (typically observed):  Accepting, Adaptable, Pleasant Orientation:  Oriented to Self, Oriented to Place, Oriented to  Time, Oriented to Situation Alcohol / Substance use:  Not Applicable Psych involvement (Current and /or in the community):  No (Comment)  Discharge Needs  Concerns to be addressed:  Discharge Planning Concerns Readmission within the last 30 days:  No Current discharge risk:  Dependent with Mobility Barriers to Discharge:  Continued Medical Work up   [employer], Veronia Beets, LCSW 07/07/2017, 2:48 PM

## 2017-07-07 NOTE — Progress Notes (Signed)
Physical Therapy Treatment Patient Details Name: Whitney [name] MRN: [id number] DOB: 1951-06-19 Today's Date: 07/07/2017    History of Present Illness Pt. is a 66 y.o. female who was admitted to [facility] for a Left TKR.    PT Comments    Pt lethargic, but agreeable to PT. Reports increased pain this afternoon; received medication. Blood pressure remains low with nausea upon sit. Session limited to supine bed exercises, which patient tolerates with assist and rest periods. Pt has several questions regarding exercises and answered to pt satisfaction. Continue PT to progress endurance, strength, range and out of bed activity to improve functional mobility.   Follow Up Recommendations  SNF     Equipment Recommendations  Other (comment)(TBD at next venue of care)    Recommendations for Other Services       Precautions / Restrictions Precautions Precautions: Knee Restrictions Weight Bearing Restrictions: Yes LLE Weight Bearing: Weight bearing as tolerated    Mobility  Bed Mobility               General bed mobility comments: Not tested due to low BP and nausea  Transfers                    Ambulation/Gait                 Stairs            Wheelchair Mobility    Modified Rankin (Stroke Patients Only)       Balance Overall balance assessment: Needs assistance Sitting-balance support: No upper extremity supported;Feet unsupported;Feet supported Sitting balance-Leahy Scale: Good         Standing balance comment: Unable                            Cognition Arousal/Alertness: Lethargic Behavior During Therapy: WFL for tasks assessed/performed Overall Cognitive Status: Within Functional Limits for tasks assessed                                        Exercises Total Joint Exercises Ankle Circles/Pumps: AROM;Both;20 reps;Supine Quad Sets: Strengthening;Both;20 reps;Supine Gluteal Sets: Strengthening;Both;20  reps;Supine Short Arc Quad: AAROM;Left;20 reps;Supine Heel Slides: AAROM;Left;20 reps;Supine Hip ABduction/ADduction: AAROM;Left;20 reps;Supine Straight Leg Raises: AAROM;Left;20 reps;Supine Long Arc Quad: AROM;Left;5 reps;10 reps Knee Flexion: AAROM;PROM;Supine(8 min) Goniometric ROM: L knee: 10-75 deg AAROM, limited by pain Other Exercises Other Exercises: HEP education and review for BLE APs, QS, and GS in supine and BLE LAQ and knee flex in sitting Other Exercises: Positioning education with pt and nursing for B heels floating with nothing behind L knee and for use of towel to prevent L hip IR while in supine.      General Comments        Pertinent Vitals/Pain     Home Living                      Prior Function            PT Goals (current goals can now be found in the care plan section) Progress towards PT goals: Not progressing toward goals - comment    Frequency    BID      PT Plan Current plan remains appropriate    Co-evaluation              AM-PAC PT "6  Clicks" Daily Activity  Outcome Measure  Difficulty turning over in bed (including adjusting bedclothes, sheets and blankets)?: Unable Difficulty moving from lying on back to sitting on the side of the bed? : Unable Difficulty sitting down on and standing up from a chair with arms (e.g., wheelchair, bedside commode, etc,.)?: Unable Help needed moving to and from a bed to chair (including a wheelchair)?: A Lot Help needed walking in hospital room?: A Lot Help needed climbing 3-5 steps with a railing? : Total 6 Click Score: 8    End of Session Equipment Utilized During Treatment: Gait belt Activity Tolerance: Patient limited by pain(low BP/nausea) Patient left: in bed;with call bell/phone within reach;with bed alarm set;with SCD's reapplied;Other (comment)(polar care in place) Nurse Communication: Mobility status;Other (comment)(BP drop in sitting with symptoms) PT Visit Diagnosis:  Difficulty in walking, not elsewhere classified (R26.2);Muscle weakness (generalized) (M62.81);Pain Pain - Right/Left: Left Pain - part of body: Knee     Time: 9326-7124 PT Time Calculation (min) (ACUTE ONLY): 34 min  Charges:  $Therapeutic Exercise: 23-37 mins $Therapeutic Activity: 8-22 mins                    G Codes:        Larae Grooms, PTA 07/07/2017, 4:05 PM

## 2017-07-07 NOTE — Progress Notes (Signed)
Pt remaining alert and oriented. Medicated for pain with good results. Pt up to bedside commode to void and have bowel movement. Pt with hypotension while on the commode with complaints that she felt like she was going to faint. Pt did not loose conciousness during this episode. Pt placed back into bed and blood pressure began to come back up. Pt stated that she felt much better and no longer had the fainting feeling. Pt able to sleep in between care. Surgical dressing dry and intact. Iv infusing without difficulty. Blood pressure has remained WDL through rest of the shift.

## 2017-07-07 NOTE — Progress Notes (Signed)
Notified MD, patient with Orthostatic BP, 100/62 supine, sitting 84/47 at 9:40. New order for 1026ml bolus over one hour. DC celebrex and aspirin. Hold all pain meds except scheduled tylenol. Bladder scan post bolus.

## 2017-07-07 NOTE — Anesthesia Postprocedure Evaluation (Signed)
Anesthesia Post Note  Patient: Linette Gunderson  Procedure(s) Performed: TOTAL KNEE ARTHROPLASTY (Left )  Patient location during evaluation: Nursing Unit Anesthesia Type: Regional and Spinal Level of consciousness: awake, awake and alert and oriented Pain management: pain level controlled Vital Signs Assessment: post-procedure vital signs reviewed and stable Respiratory status: spontaneous breathing, respiratory function stable and nonlabored ventilation Cardiovascular status: stable Postop Assessment: no headache, no backache, adequate PO intake and patient able to bend at knees Anesthetic complications: no     Last Vitals:  Vitals:   07/07/17 0359 07/07/17 0744  BP: 131/75 132/77  Pulse: 85 86  Resp: 16   Temp: 36.6 C 36.9 C  SpO2: 96% 98%    Last Pain:  Vitals:   07/07/17 0744  TempSrc: Oral  PainSc:                  Ricki Miller

## 2017-07-07 NOTE — Progress Notes (Signed)
Blood Pressure 128/55. Hydralazine 35mg  due. Dr. Jannifer Franklin notified. Order to hold hyralazine obtained.

## 2017-07-07 NOTE — Clinical Social Work Placement (Signed)
   CLINICAL SOCIAL WORK PLACEMENT  NOTE  Date:  07/07/2017  Patient Details  Name: Whitney [name] MRN: [id number] Date of Birth: 1951/03/14  Clinical Social Work is seeking post-discharge placement for this patient at the [place] level of care (*CSW will initial, date and re-position this form in  chart as items are completed):  Yes   Patient/family provided with [place] Work Department's list of facilities offering this level of care within the geographic area requested by the patient (or if unable, by the patient's family).  Yes   Patient/family informed of their freedom to choose among providers that offer the needed level of care, that participate in Medicare, Medicaid or managed care program needed by the patient, have an available bed and are willing to accept the patient.  Yes   Patient/family informed of [place]'s ownership interest in [facility] and [facility], as well as of the fact that they are under no obligation to receive care at these facilities.  PASRR submitted to EDS on 07/06/17     PASRR number received on 07/06/17     Existing PASRR number confirmed on       FL2 transmitted to all facilities in geographic area requested by pt/family on 07/07/17     FL2 transmitted to all facilities within larger geographic area on       Patient informed that his/her managed care company has contracts with or will negotiate with certain facilities, including the following:        Yes   Patient/family informed of bed offers received.  Patient chooses bed at [facility])     Physician recommends and patient chooses bed at      Patient to be transferred to   on  .  Patient to be transferred to facility by       Patient family notified on   of transfer.  Name of family member notified:        PHYSICIAN       Additional Comment:    _______________________________________________ Dekari Bures, Veronia Beets, LCSW 07/07/2017,  2:47 PM

## 2017-07-07 NOTE — Progress Notes (Signed)
Physical Therapy Treatment Patient Details Name: Whitney [name] MRN: [id number] DOB: 09/21/1950 Today's Date: 07/07/2017    History of Present Illness Pt. is a 66 y.o. female who was admitted to [facility] for a Left TKR.    PT Comments    Pt limited again this session by light headedness and dizziness along with feeling "flush" after sitting at EOB with transfers and amb not attempted. Pt's vitals measured frequently during session with baseline BP in supine 100/62 mmHg and then 84/47 mmHg in sitting with symptoms, nursing aware and in room with pt at end of session.  Pt's HR and SpO2 WNL on room air during session.  Pt will benefit from PT services in a SNF setting upon discharge to safely address above deficits for decreased caregiver assistance and eventual return to PLOF.     Follow Up Recommendations  SNF     Equipment Recommendations  Other (comment)(TBD at next venue of care)    Recommendations for Other Services       Precautions / Restrictions Precautions Precautions: Knee Restrictions Weight Bearing Restrictions: Yes LLE Weight Bearing: Weight bearing as tolerated    Mobility  Bed Mobility Overal bed mobility: Needs Assistance Bed Mobility: Supine to Sit;Sit to Supine     Supine to sit: Mod assist Sit to supine: Mod assist      Transfers                 General transfer comment: Unable to attempt secondary to dizziness and light headedness with BP taken in sitting at 84/47 mmHg compared to 100/62 mmHg in supine, nsg notified  Ambulation/Gait             General Gait Details: Sports coach    Modified Rankin (Stroke Patients Only)       Balance Overall balance assessment: Needs assistance Sitting-balance support: No upper extremity supported;Feet unsupported;Feet supported Sitting balance-Leahy Scale: Good         Standing balance comment: Unable                            Cognition  Arousal/Alertness: Awake/alert Behavior During Therapy: WFL for tasks assessed/performed Overall Cognitive Status: Within Functional Limits for tasks assessed                                        Exercises Total Joint Exercises Ankle Circles/Pumps: AROM;Both;10 reps;15 reps Quad Sets: Strengthening;Both;10 reps;15 reps Gluteal Sets: Strengthening;Both;10 reps Hip ABduction/ADduction: AAROM;Left;5 reps Straight Leg Raises: AAROM;Left;5 reps Long Arc Quad: AROM;Left;5 reps;10 reps Knee Flexion: AROM;Left;5 reps;10 reps Goniometric ROM: L knee: 10-75 deg AAROM, limited by pain Other Exercises Other Exercises: HEP education and review for BLE APs, QS, and GS in supine and BLE LAQ and knee flex in sitting Other Exercises: Positioning education with pt and nursing for B heels floating with nothing behind L knee and for use of towel to prevent L hip IR while in supine.      General Comments        Pertinent Vitals/Pain Pain Assessment: 0-10 Pain Score: 5  Pain Location: L knee Pain Descriptors / Indicators: Aching;Operative site guarding Pain Intervention(s): Patient requesting pain meds-RN notified;Monitored during session;Limited activity within patient's tolerance    Home Living Family/patient expects to be discharged to::  Private residence Living Arrangements: Alone Available Help at Discharge: Family(cousin who is currently out of town.) Type of Home: [place]: One [place]: Kasandra Knudsen - single point      Prior Function Level of Independence: Independent with assistive device(s)      Comments: Independent with ADLs, IADLs using a cane. Independent meal preparation, medication management, no driving, retired from working for [employer]. Pt. reports having difficulty with groceries.   PT Goals (current goals can now be found in the care plan section) Acute Rehab PT Goals Patient Stated Goal: To return home  Progress towards PT goals:  Not progressing toward goals - comment(Limited by symptoms with position change)    Frequency    BID      PT Plan Current plan remains appropriate    Co-evaluation              AM-PAC PT "6 Clicks" Daily Activity  Outcome Measure                   End of Session Equipment Utilized During Treatment: Gait belt Activity Tolerance: Other (comment)(Limited by dizziness with change in position) Patient left: in bed;with nursing/sitter in room;with call bell/phone within reach;with bed alarm set(Nursing in room assessing pt at end of session) Nurse Communication: Mobility status;Other (comment)(BP drop in sitting with symptoms) PT Visit Diagnosis: Difficulty in walking, not elsewhere classified (R26.2);Muscle weakness (generalized) (M62.81);Pain     Time: 7989-2119 PT Time Calculation (min) (ACUTE ONLY): 39 min  Charges:  $Therapeutic Exercise: 23-37 mins $Therapeutic Activity: 8-22 mins                    G Codes:       DRoyetta Asal PT, DPT 07/07/17, 12:17 PM

## 2017-07-07 NOTE — Evaluation (Addendum)
Occupational Therapy Evaluation Patient Details Name: Marieclaire Bettenhausen MRN: [id number] DOB: 1951-07-19 Today's Date: 07/07/2017    History of Present Illness Pt. is a 66 y.o. female who was admitted to [facility] for a Left TKR.   Clinical Impression   Pt. was sitting at the EOB upon arrival. Pt. BP 84/47 at the EOB, and pt. reported nausea. PT and nursing were present. Pt. Was assisted back to supine with maxAx2, and BP increased to 145/74. Once returned to bed, pt. Reported feeling better, and agreeable to OT from bed level. Pt. Education was provided verbally, and through visual demonstration. Pt. Had multiple questions about A/E, and DME. Pt. Resides at home with her cousin, was independent with ADLS, and IADLs using a cane. Pt. Had difficulty with shopping, and needed to be accompanied. Pt. Reports having multiple falls at home. Pt. Reports her cousin will be out of town, and unable to assist pt. Upon discharge. Pt. Presents with weakness, orthostatic hypotension, pain, limited mobility, and decreased activity tolerance. Pt. Could benefit from skilled OT services for ADL training, A/E training, UE there. Ex., there. Act., and pt. Education about work simplification techniques, and DME. Pt. Plans to go to SNF, however reports she would like to return to seeing Wes in Outpatient as soon as she can. Pt. Would benefit from follow-up OT services upon discharge.    Follow Up Recommendations  SNF    Equipment Recommendations  3 in 1 bedside commode;Tub/shower seat    Recommendations for Other Services       Precautions / Restrictions Restrictions Weight Bearing Restrictions: Yes LLE Weight Bearing: Weight bearing as tolerated             ADL either performed or assessed with clinical judgement   ADL Overall ADL's : Needs assistance/impaired Eating/Feeding: Set up;Independent   Grooming: Set up;Independent   Upper Body Bathing: Set up;Min guard   Lower Body Bathing: Set up;Maximal  assistance   Upper Body Dressing : Set up;Minimal assistance   Lower Body Dressing: Set up;Maximal assistance               Functional mobility during ADLs: Maximal assistance;+2 for physical assistance General ADL Comments: Pt. education was provided verbally about A?E use for LE ADLs.     Vision Patient Visual Report: No change from baseline       Perception     Praxis      Pertinent Vitals/Pain Pain Assessment: 0-10 Pain Score: 5  Pain Location: L knee Pain Descriptors / Indicators: Aching;Grimacing;Guarding Pain Intervention(s): Limited activity within patient's tolerance;Monitored during session     Hand Dominance     Extremity/Trunk Assessment Upper Extremity Assessment Upper Extremity Assessment: Overall WFL for tasks assessed. Reports history of Rotator Cuff injury.           Communication Communication Communication: No difficulties   Cognition Arousal/Alertness: Awake/alert Behavior During Therapy: WFL for tasks assessed/performed Overall Cognitive Status: Within Functional Limits for tasks assessed                                     General Comments       Exercises     Shoulder Instructions      Home Living Family/patient expects to be discharged to:: Private residence Living Arrangements: Alone Available Help at Discharge: Family(cousin who is currently out of town.) Type of Home: [place]: One  level               Home Equipment: Cane - single point          Prior Functioning/Environment Level of Independence: Independent with assistive device(s)        Comments: Independent with ADLs, IADLs using a cane. Independent meal preparation, medication management, no driving, retired from working for [employer]. Pt. reports having difficulty with groceries.        OT Problem List: Decreased strength;Decreased activity tolerance;Decreased range of motion;Pain;Impaired UE functional  use;Decreased knowledge of use of DME or AE      OT Treatment/Interventions: Self-care/ADL training;Therapeutic activities;Therapeutic exercise;Neuromuscular education;Patient/family education;DME and/or AE instruction    OT Goals(Current goals can be found in the care plan section) Acute Rehab OT Goals Patient Stated Goal: To return home  OT Goal Formulation: With patient Potential to Achieve Goals: Good  OT Frequency: Min 1X/week   Barriers to D/C:            Co-evaluation              AM-PAC PT "6 Clicks" Daily Activity     Outcome Measure Help from another person eating meals?: None Help from another person taking care of personal grooming?: None Help from another person toileting, which includes using toliet, bedpan, or urinal?: A Lot Help from another person bathing (including washing, rinsing, drying)?: A Lot Help from another person to put on and taking off regular upper body clothing?: A Little Help from another person to put on and taking off regular lower body clothing?: A Lot 6 Click Score: 17   End of Session    Activity Tolerance: Other (comment)(orthostatic in sitting.) Patient left: in bed;with call bell/phone within reach;with bed alarm set  OT Visit Diagnosis: History of falling (Z91.81);Repeated falls (R29.6)                Time: 2952-8413 OT Time Calculation (min): 31 min Charges:  OT General Charges $OT Visit: 1 Visit OT Evaluation $OT Eval Moderate Complexity: 1 Mod G-Codes: OT G-codes **NOT FOR INPATIENT CLASS** Functional Assessment Tool Used: AM-PAC 6 Clicks Daily Activity;Clinical judgement Functional Limitation: Self care Self Care Current Status (K4401): At least 60 percent but less than 80 percent impaired, limited or restricted Self Care Goal Status (U2725): At least 1 percent but less than 20 percent impaired, limited or restricted   Harrel Carina, MS, OTR/L   Harrel Carina, MS, OTR/L 07/07/2017, 11:28 AM

## 2017-07-07 NOTE — Progress Notes (Signed)
  Subjective:  Patient reports pain as moderate.  No other complaints  Objective:   VITALS:   Vitals:   07/06/17 2240 07/06/17 2302 07/07/17 0359 07/07/17 0744  BP: (!) 144/91 (!) 146/77 131/75 132/77  Pulse: 97 73 85 86  Resp:  18 16   Temp:  97.8 F (36.6 C) 97.8 F (36.6 C) 98.4 F (36.9 C)  TempSrc:  Oral Oral Oral  SpO2:  100% 96% 98%  Weight:      Height:        PHYSICAL EXAM:  Sensation intact distally Intact pulses distally Dorsiflexion/Plantar flexion intact Incision: dressing C/D/I Compartment soft  LABS  Results for orders placed or performed during the hospital encounter of 07/06/17 (from the past 24 hour(s))  CBC     Status: Abnormal   Collection Time: 07/07/17  3:01 AM  Result Value Ref Range   WBC 8.0 3.6 - 11.0 K/uL   RBC 4.03 3.80 - 5.20 MIL/uL   Hemoglobin 11.9 (L) 12.0 - 16.0 g/dL   HCT 35.9 35.0 - 47.0 %   MCV 89.1 80.0 - 100.0 fL   MCH 29.6 26.0 - 34.0 pg   MCHC 33.2 32.0 - 36.0 g/dL   RDW 13.4 11.5 - 14.5 %   Platelets 239 150 - 440 K/uL  Basic metabolic panel     Status: Abnormal   Collection Time: 07/07/17  3:01 AM  Result Value Ref Range   Sodium 137 135 - 145 mmol/L   Potassium 4.5 3.5 - 5.1 mmol/L   Chloride 109 101 - 111 mmol/L   CO2 21 (L) 22 - 32 mmol/L   Glucose, Bld 121 (H) 65 - 99 mg/dL   BUN 35 (H) 6 - 20 mg/dL   Creatinine, Ser 1.37 (H) 0.44 - 1.00 mg/dL   Calcium 8.3 (L) 8.9 - 10.3 mg/dL   GFR calc non Af Amer 39 (L) >60 mL/min   GFR calc Af Amer 45 (L) >60 mL/min   Anion gap 7 5 - 15    Dg Knee Left Port  Result Date: 07/06/2017 CLINICAL DATA:  Left knee replacement EXAM: PORTABLE LEFT KNEE - 1-2 VIEW COMPARISON:  None FINDINGS: Postoperative change from left knee arthroplasty. The hardware components are in anatomic alignment. No periprosthetic fracture or subluxation. Gas is identified within the joint space and there are midline skin staples along the anterior aspect of the knee. IMPRESSION: 1. No complications  status post left knee arthroplasty. Electronically Signed   By: Kerby Moors M.D.   On: 07/06/2017 11:03    Assessment/Plan: 1 Day Post-Op   Active Problems:   Total knee replacement status, left   Advance diet Up with therapy Fluid bolus for orthostatic hypotension Plan for discharge tomorrow to short stay SNF when medically stable   Lovell Sheehan , MD 07/07/2017, 7:58 AM

## 2017-07-07 NOTE — Progress Notes (Deleted)
OT Cancellation Note  Patient Details Name: Whitney [name] MRN: [id number] DOB: 1951/04/30   Cancelled Treatment:    Reason Eval/Treat Not Completed: Medical issues which prohibited therapy. Order received, chart reviewed. Pt noted to be recently orthostatic and symptomatic with NSG. New order for 1065ml bolus over one hour. Will hold OT evaluation this am and re-attempt at later time as pt is appropriate.  Jeni Salles, MPH, MS, OTR/L ascom [phone] 07/07/17, 10:18 AM

## 2017-07-07 NOTE — Progress Notes (Signed)
Patient BP with improvement at this time. Patient is crying and flailing around in pain. Medicated x 1 per conversation with MD. Bladder scanned in 42's. In and out cath not performed, parameter not met.

## 2017-07-08 ENCOUNTER — Encounter
Admission: RE | Admit: 2017-07-08 | Discharge: 2017-07-08 | Disposition: A | Discharge status: Home / Self Care | Payer: Medicare Other | Source: Ambulatory Visit | Attending: Internal Medicine | Admitting: Internal Medicine

## 2017-07-08 LAB — CBC
HCT: 34.6 % — ABNORMAL LOW (ref 35.0–47.0)
Hemoglobin: 11.4 g/dL — ABNORMAL LOW (ref 12.0–16.0)
MCH: 29.5 pg (ref 26.0–34.0)
MCHC: 32.9 g/dL (ref 32.0–36.0)
MCV: 89.7 fL (ref 80.0–100.0)
Platelets: 198 10*3/uL (ref 150–440)
RBC: 3.86 MIL/uL (ref 3.80–5.20)
RDW: 13.3 % (ref 11.5–14.5)
WBC: 6.2 10*3/uL (ref 3.6–11.0)

## 2017-07-08 MED ORDER — HYDRALAZINE HCL 10 MG PO TABS: 10.0000 mg | ORAL_TABLET | Freq: Three times a day (TID) | ORAL | 1 refills | Status: DC

## 2017-07-08 MED ORDER — HYDROCODONE-ACETAMINOPHEN 5-325 MG PO TABS: 1.0000 | ORAL_TABLET | ORAL | 0 refills | Status: DC | PRN

## 2017-07-08 MED ORDER — DOCUSATE SODIUM 100 MG PO CAPS: 100.0000 mg | ORAL_CAPSULE | Freq: Two times a day (BID) | ORAL | 0 refills | Status: DC

## 2017-07-08 MED ORDER — ASPIRIN 325 MG PO TBEC: 325.0000 mg | DELAYED_RELEASE_TABLET | Freq: Every day | ORAL | 0 refills | Status: DC

## 2017-07-08 MED ORDER — SENNA 8.6 MG PO TABS: 1.0000 | ORAL_TABLET | Freq: Two times a day (BID) | ORAL | 0 refills | Status: DC

## 2017-07-08 NOTE — Progress Notes (Signed)
Attempted to call report X 3

## 2017-07-08 NOTE — Clinical Social Work Placement (Signed)
   CLINICAL SOCIAL WORK PLACEMENT  NOTE  Date:  07/08/2017  Patient Details  Name: Whitney [name] MRN: [id number] Date of Birth: [birth year]  Clinical Social Work is seeking post-discharge placement for this patient at the [place] level of care (*CSW will initial, date and re-position this form in  chart as items are completed):  Yes   Patient/family provided with [place] Work Department's list of facilities offering this level of care within the geographic area requested by the patient (or if unable, by the patient's family).  Yes   Patient/family informed of their freedom to choose among providers that offer the needed level of care, that participate in Medicare, Medicaid or managed care program needed by the patient, have an available bed and are willing to accept the patient.  Yes   Patient/family informed of [place]'s ownership interest in [facility] and [facility], as well as of the fact that they are under no obligation to receive care at these facilities.  PASRR submitted to EDS on 07/06/17     PASRR number received on 07/06/17     Existing PASRR number confirmed on       FL2 transmitted to all facilities in geographic area requested by pt/family on 07/07/17     FL2 transmitted to all facilities within larger geographic area on       Patient informed that his/her managed care company has contracts with or will negotiate with certain facilities, including the following:        Yes   Patient/family informed of bed offers received.  Patient chooses bed at [facility])     Physician recommends and patient chooses bed at      Patient to be transferred to [facility] ) on 07/08/17.  Patient to be transferred to facility by [facility] )     Patient family notified on 07/08/17 of transfer.  Name of family member notified:  (Patient's sister Lorriane Shire is aware of D/C today. )     PHYSICIAN       Additional  Comment:    _______________________________________________ Alayah Knouff, Veronia Beets, LCSW 07/08/2017, 8:48 AM

## 2017-07-08 NOTE — Progress Notes (Signed)
  Subjective:  Patient reports pain as moderate.    Objective:   VITALS:   Vitals:   07/07/17 1636 07/07/17 1937 07/08/17 0448 07/08/17 0736  BP: 131/61 (!) 128/55 (!) 161/86 (!) 160/73  Pulse: 82 83 88 89  Resp: 20 19 19 18   Temp: 99 F (37.2 C) 98.6 F (37 C) 99.1 F (37.3 C) 98.2 F (36.8 C)  TempSrc: Oral Oral Oral Oral  SpO2: 96% 94% 95% 97%  Weight:      Height:        PHYSICAL EXAM:  Sensation intact distally Dorsiflexion/Plantar flexion intact Incision: dressing C/D/I Compartment soft  LABS  Results for orders placed or performed during the hospital encounter of 07/06/17 (from the past 24 hour(s))  CBC     Status: Abnormal   Collection Time: 07/08/17  4:44 AM  Result Value Ref Range   WBC 6.2 3.6 - 11.0 K/uL   RBC 3.86 3.80 - 5.20 MIL/uL   Hemoglobin 11.4 (L) 12.0 - 16.0 g/dL   HCT 34.6 (L) 35.0 - 47.0 %   MCV 89.7 80.0 - 100.0 fL   MCH 29.5 26.0 - 34.0 pg   MCHC 32.9 32.0 - 36.0 g/dL   RDW 13.3 11.5 - 14.5 %   Platelets 198 150 - 440 K/uL    Dg Knee Left Port  Result Date: 07/06/2017 CLINICAL DATA:  Left knee replacement EXAM: PORTABLE LEFT KNEE - 1-2 VIEW COMPARISON:  None FINDINGS: Postoperative change from left knee arthroplasty. The hardware components are in anatomic alignment. No periprosthetic fracture or subluxation. Gas is identified within the joint space and there are midline skin staples along the anterior aspect of the knee. IMPRESSION: 1. No complications status post left knee arthroplasty. Electronically Signed   By: Kerby Moors M.D.   On: 07/06/2017 11:03    Assessment/Plan: 2 Days Post-Op   Active Problems:   Total knee replacement status, left   Up with therapy D/C IV fluids Plan for discharge to SNF Dressing changed, patient may shower   Lovell Sheehan , MD 07/08/2017, 8:13 AM

## 2017-07-08 NOTE — Progress Notes (Signed)
EMS called for transportation.  

## 2017-07-08 NOTE — Progress Notes (Signed)
Patient is medically stable to D/C to [facility] today. Per [facility] admissions coordinator at [facility] patient can come today to room 206-A. RN will call report at [phone] and arrange EMS for transport. Clinical Education officer, museum (CSW) sent D/C orders to [employer] via [employer]. Patient is aware of above. CSW contacted patient's sister Lorriane Shire and made her aware of above. Please reconsult if future social work needs arise. CSW signing off.   [employer], LCSW [phone]

## 2017-07-08 NOTE — Discharge Summary (Addendum)
Physician Discharge Summary  Patient ID: Whitney [name] MRN: [id number] DOB/AGE: 1950/10/17 66 y.o.  Admit date: 07/06/2017 Discharge date: 07/08/2017  Admission Diagnoses:  M17.12 Unilateral primary osteoarthritis, left knee <principal problem not specified>  Discharge Diagnoses:  M17.12 Unilateral primary osteoarthritis, left knee Active Problems:   Total knee replacement status, left   Past Medical History:  Diagnosis Date  . Abnormal Pap smear of cervix    Pt states she had colposcopy   . Arthritis   . Chronic kidney disease   . Hx of unilateral nephrectomy 1979   Left Nephrectomy  . Hypertension   . PMB (postmenopausal bleeding)   . Restless leg syndrome   . Vitamin D deficiency     Surgeries: Procedure(s): TOTAL KNEE ARTHROPLASTY on 07/06/2017   Consultants (if any):   Discharged Condition: Improved  Hospital Course: Whitney [name] is an 66 y.o. female who was admitted 07/06/2017 with a diagnosis of  M17.12 Unilateral primary osteoarthritis, left knee <principal problem not specified> and went to the operating room on 07/06/2017 and underwent the above named procedures.    She was given perioperative antibiotics:  Anti-infectives (From admission, onward)   Start     Dose/Rate Route Frequency Ordered Stop   07/06/17 1400  ceFAZolin (ANCEF) IVPB 2g/100 mL premix     2 g 200 mL/hr over 30 Minutes Intravenous Every 6 hours 07/06/17 1128 07/06/17 2118   07/06/17 1200  fluconazole (DIFLUCAN) tablet 150 mg     150 mg Oral Every 3 DAYS 07/06/17 1128     07/06/17 0615  ceFAZolin (ANCEF) IVPB 2g/100 mL premix     2 g 200 mL/hr over 30 Minutes Intravenous  Once 07/06/17 0612 07/06/17 0833   07/06/17 0603  ceFAZolin (ANCEF) 2-4 GM/100ML-% IVPB    Comments:  Whitney [name]   : cabinet override      07/06/17 0603 07/06/17 0803   07/06/17 0600  ceFAZolin (ANCEF) IVPB 2g/100 mL premix  Status:  Discontinued     2 g 200 mL/hr over 30 Minutes Intravenous On call to O.R. 07/05/17  2151 07/06/17 1125    .  She was given sequential compression devices, early ambulation, and ECASA for DVT prophylaxis.  She benefited maximally from the hospital stay and episodes of orthostatic hypotension which resolved with administration of a fluid bolus and holding her blood pressure medications. This resolved on POD #2.  Recent vital signs:  Vitals:   07/08/17 0448 07/08/17 0736  BP: (!) 161/86 (!) 160/73  Pulse: 88 89  Resp: 19 18  Temp: 99.1 F (37.3 C) 98.2 F (36.8 C)  SpO2: 95% 97%    Recent laboratory studies:  Lab Results  Component Value Date   HGB 11.4 (L) 07/08/2017   HGB 11.9 (L) 07/07/2017   HGB 13.6 06/29/2017   Lab Results  Component Value Date   WBC 6.2 07/08/2017   PLT 198 07/08/2017   Lab Results  Component Value Date   INR 0.94 07/06/2017   Lab Results  Component Value Date   NA 137 07/07/2017   K 4.5 07/07/2017   CL 109 07/07/2017   CO2 21 (L) 07/07/2017   BUN 35 (H) 07/07/2017   CREATININE 1.37 (H) 07/07/2017   GLUCOSE 121 (H) 07/07/2017    Discharge Medications:   Allergies as of 07/08/2017      Reactions   Enalapril Swelling   Lisinopril Swelling      Medication List    STOP taking these medications   traMADol 50  MG tablet Commonly known as:  ULTRAM     TAKE these medications   amLODipine 10 MG tablet Commonly known as:  NORVASC Take 10 mg daily by mouth.   aspirin 325 MG EC tablet Take 1 tablet (325 mg total) by mouth daily with breakfast. Start taking on:  07/09/2017 What changed:    medication strength  how much to take  when to take this   carvedilol 3.125 MG tablet Commonly known as:  COREG Take 3.125 mg 2 (two) times daily with a meal by mouth.   docusate sodium 100 MG capsule Commonly known as:  COLACE Take 1 capsule (100 mg total) by mouth 2 (two) times daily.   ergocalciferol 50000 units capsule Commonly known as:  VITAMIN D2 Take 50,000 Units every Sunday by mouth.   famotidine 20 MG  tablet Commonly known as:  PEPCID Take 20 mg 2 (two) times daily by mouth.   fluconazole 150 MG tablet Commonly known as:  DIFLUCAN Take 1 tablet (150 mg total) by mouth every 3 (three) days.   gabapentin 100 MG capsule Commonly known as:  NEURONTIN Take 200 mg 2 (two) times daily by mouth.   hydrALAZINE 10 MG tablet Commonly known as:  APRESOLINE Take 1 tablet (10 mg total) by mouth 3 (three) times daily. Hold for low blood pressure systolic less than 811 mmHg, diastolic less than 70 mmHg What changed:    additional instructions  Another medication with the same name was removed. Continue taking this medication, and follow the directions you see here.   HYDROcodone-acetaminophen 5-325 MG tablet Commonly known as:  NORCO/VICODIN Take 1-2 tablets by mouth every 4 (four) hours as needed for moderate pain ((score 4 to 6)).   rOPINIRole 0.5 MG tablet Commonly known as:  REQUIP Take 0.5 mg at bedtime by mouth. Takes 1 or 2 tablets as needed   senna 8.6 MG Tabs tablet Commonly known as:  SENOKOT Take 1 tablet (8.6 mg total) by mouth 2 (two) times daily.       Diagnostic Studies: Dg Chest 2 View  Result Date: 06/29/2017 CLINICAL DATA:  Increased chest pain/pressure. EXAM: CHEST  2 VIEW COMPARISON:  None FINDINGS: There is moderate cardiac enlargement. No pleural effusion or edema identified. Mild asymmetric elevation of the left hemidiaphragm. No airspace opacities. IMPRESSION: No acute cardiopulmonary abnormality Electronically Signed   By: Kerby Moors M.D.   On: 06/29/2017 16:17   Nm Myocar Multi W/spect W/wall Motion / Ef  Result Date: 06/30/2017  Normal myocardial perfusion without evidence of ischemia or scar.  The left ventricular ejection fraction is moderately decreased (30-44%). Given normal LVEF by echo performed yesterday, LVEF calculation on this study may be artificially low secondary to gating difficulties with frequent PVCs.  This is an intermediate risk  study based on moderately reduced LVEF.    US Venous Img Lower Bilateral  Result Date: 06/29/2017 CLINICAL DATA:  Bilateral lower extremity pain and edema, right greater than left. Patient was scheduled for left total knee replacement today however the procedure was canceled due to patient's development of chest pain and shortness of breath. Evaluate for DVT. EXAM: BILATERAL LOWER EXTREMITY VENOUS DOPPLER ULTRASOUND TECHNIQUE: Gray-scale sonography with graded compression, as well as color Doppler and duplex ultrasound were performed to evaluate the lower extremity deep venous systems from the level of the common femoral vein and including the common femoral, femoral, profunda femoral, popliteal and calf veins including the posterior tibial, peroneal and gastrocnemius veins when visible. The  superficial great saphenous vein was also interrogated. Spectral Doppler was utilized to evaluate flow at rest and with distal augmentation maneuvers in the common femoral, femoral and popliteal veins. COMPARISON:  None. FINDINGS: RIGHT LOWER EXTREMITY Common Femoral Vein: No evidence of thrombus. Normal compressibility, respiratory phasicity and response to augmentation. Saphenofemoral Junction: No evidence of thrombus. Normal compressibility and flow on color Doppler imaging. Profunda Femoral Vein: No evidence of thrombus. Normal compressibility and flow on color Doppler imaging. Femoral Vein: No evidence of thrombus. Normal compressibility, respiratory phasicity and response to augmentation. Popliteal Vein: No evidence of thrombus. Normal compressibility, respiratory phasicity and response to augmentation. Calf Veins: No evidence of thrombus. Normal compressibility and flow on color Doppler imaging. Superficial Great Saphenous Vein: No evidence of thrombus. Normal compressibility. Venous Reflux:  None. Other Findings:  None. LEFT LOWER EXTREMITY Common Femoral Vein: No evidence of thrombus. Normal compressibility,  respiratory phasicity and response to augmentation. Saphenofemoral Junction: No evidence of thrombus. Normal compressibility and flow on color Doppler imaging. Profunda Femoral Vein: No evidence of thrombus. Normal compressibility and flow on color Doppler imaging. Femoral Vein: No evidence of thrombus. Normal compressibility, respiratory phasicity and response to augmentation. Popliteal Vein: No evidence of thrombus. Normal compressibility, respiratory phasicity and response to augmentation. Calf Veins: No evidence of thrombus. Normal compressibility and flow on color Doppler imaging. Superficial Great Saphenous Vein: No evidence of thrombus. Normal compressibility. Venous Reflux:  None. Other Findings:  None. IMPRESSION: No evidence of DVT within either lower extremity. Electronically Signed   By: Sandi Mariscal M.D.   On: 06/29/2017 16:06   Dg Knee Left Port  Result Date: 07/06/2017 CLINICAL DATA:  Left knee replacement EXAM: PORTABLE LEFT KNEE - 1-2 VIEW COMPARISON:  None FINDINGS: Postoperative change from left knee arthroplasty. The hardware components are in anatomic alignment. No periprosthetic fracture or subluxation. Gas is identified within the joint space and there are midline skin staples along the anterior aspect of the knee. IMPRESSION: 1. No complications status post left knee arthroplasty. Electronically Signed   By: Kerby Moors M.D.   On: 07/06/2017 11:03    Disposition: Skilled nursing facility    Contact information for after-discharge care    Destination    [facility] .   Service:  Skilled Nursing Contact information: [address] [place] [phone]               Signed: Lovell Sheehan ,MD 07/08/2017, 8:34 AM

## 2017-07-08 NOTE — Progress Notes (Signed)
EMS here to transport pt. 

## 2017-07-08 NOTE — Progress Notes (Signed)
Occupational Therapy Treatment Patient Details Name: Whitney [name] MRN: [id number] DOB: [birth year] Today's Date: 07/08/2017    History of present illness Pt. is a 66 y.o. female who was admitted to [facility] for a Left TKR.   OT comments  Pt. was seen at bed level. EMS arrived at end of the session to transport pt. To [employer] Pt. Education was provided about energy conservation, work simplification, and DME. Pt. questions were answered about A/E, and DME. Pt. Continues to benefit from OT services, and will benefit from follow-up services at [facility]. No further OT services are warranted at this level of care, and pt. Is being discharged to SNF.   Follow Up Recommendations  SNF    Equipment Recommendations  3 in 1 bedside commode    Recommendations for Other Services      Precautions / Restrictions Precautions Precautions: Knee Restrictions Weight Bearing Restrictions: Yes LLE Weight Bearing: Weight bearing as tolerated       Mobility Bed Mobility    Pt. Seen at bed level                                                                                 ADL either performed or assessed with clinical judgement   ADL Overall ADL's : Needs assistance/impaired Eating/Feeding: Set up;Independent   Grooming: Set up;Independent   Upper Body Bathing: Min guard;Set up   Lower Body Bathing: Set up;Maximal assistance   Upper Body Dressing : Set up;Minimal assistance   Lower Body Dressing: Set up;Maximal assistance                 General ADL Comments: Pt. education was provided about energy conservation/work simplification techniques, A/E, and DME.     Vision       Perception     Praxis      Cognition Arousal/Alertness: Awake/alert Behavior During Therapy: WFL for tasks assessed/performed Overall Cognitive Status: Within Functional Limits for tasks assessed                                           Exercises     Shoulder Instructions       General Comments      Pertinent Vitals/ Pain       Pain Score: 5  Pain Descriptors / Indicators: Constant;Aching;Operative site guarding;Throbbing Pain Intervention(s): Limited activity within patient's tolerance  Home Living                                          Prior Functioning/Environment              Frequency  Min 1X/week        Progress Toward Goals  OT Goals(current goals can now be found in the care plan section)  Progress towards OT goals: OT to reassess next treatment  Acute Rehab OT Goals Patient Stated Goal: To return home  OT Goal Formulation: With patient Potential to Achieve Goals: Good  Plan  Co-evaluation                 AM-PAC PT "6 Clicks" Daily Activity     Outcome Measure   Help from another person eating meals?: None Help from another person taking care of personal grooming?: None Help from another person toileting, which includes using toliet, bedpan, or urinal?: A Lot Help from another person bathing (including washing, rinsing, drying)?: A Lot Help from another person to put on and taking off regular upper body clothing?: A Little Help from another person to put on and taking off regular lower body clothing?: A Lot 6 Click Score: 17    End of Session    OT Visit Diagnosis: History of falling (Z91.81);Repeated falls (R29.6)   Activity Tolerance     Patient Left in bed;with call bell/phone within reach;with bed alarm set   Nurse Communication      Functional Assessment Tool Used: AM-PAC 6 Clicks Daily Activity;Clinical judgement Functional Limitation: Self care Self Care Current Status (L2440): At least 60 percent but less than 80 percent impaired, limited or restricted Self Care Goal Status (N0272): At least 1 percent but less than 20 percent impaired, limited or restricted   Time: 1115-1135 OT Time Calculation (min): 20 min  Charges: OT  G-codes **NOT FOR INPATIENT CLASS** Functional Assessment Tool Used: AM-PAC 6 Clicks Daily Activity;Clinical judgement Functional Limitation: Self care Self Care Current Status (Z3664): At least 60 percent but less than 80 percent impaired, limited or restricted Self Care Goal Status (Q0347): At least 1 percent but less than 20 percent impaired, limited or restricted OT General Charges $OT Visit: 1 Visit OT Treatments $Self Care/Home Management : 8-22 mins   Harrel Carina, MS, OTR/L  Harrel Carina, MS, OTR/L 07/08/2017, 12:48 PM

## 2017-07-08 NOTE — Progress Notes (Signed)
Report given to [facility].

## 2017-07-08 NOTE — Discharge Instructions (Signed)
Continue weight bear as tolerated on the left lower extremity.    Elevate the left lower extremity whenever possible and continue the polar care while elevating the extremity. Patient may shower. No bath or submerging the wound.    Take ECASA 325 mg as directed for blood clot prevention.  Continue to work on knee range of motion exercises at home as instructed by physical therapy. Continue to use a walker for assistance with ambulation until cleared by physical therapy.  Call [phone] with any questions, such as fever > 101.5 degrees, drainage from the wound or shortness of breath.

## 2017-07-08 NOTE — Progress Notes (Signed)
Pt reports having a bowel movement after surgery. MD Harlow Mares notified, per MD pt ok to discharge.

## 2017-07-09 DIAGNOSIS — M1712 Unilateral primary osteoarthritis, left knee: Secondary | ICD-10-CM | POA: Insufficient documentation

## 2017-07-20 ENCOUNTER — Other Ambulatory Visit: Payer: Self-pay

## 2017-07-20 MED ORDER — HYDROCODONE-ACETAMINOPHEN 5-325 MG PO TABS: 1.0000 | ORAL_TABLET | ORAL | 0 refills | Status: DC | PRN

## 2017-07-20 NOTE — Telephone Encounter (Signed)
Rx sent to [facility] phone : [phone] , fax : [phone]  

## 2017-07-21 ENCOUNTER — Encounter: Payer: Self-pay | Admitting: Gerontology

## 2017-07-22 NOTE — Progress Notes (Signed)
This encounter was created in error - please disregard.

## 2017-07-29 ENCOUNTER — Ambulatory Visit: Payer: Medicare Other | Attending: Orthopedic Surgery | Admitting: Physical Therapy

## 2017-07-29 ENCOUNTER — Encounter: Payer: Self-pay | Admitting: Physical Therapy

## 2017-07-29 ENCOUNTER — Other Ambulatory Visit: Payer: Self-pay

## 2017-07-29 DIAGNOSIS — M6281 Muscle weakness (generalized): Secondary | ICD-10-CM | POA: Insufficient documentation

## 2017-07-29 DIAGNOSIS — R262 Difficulty in walking, not elsewhere classified: Secondary | ICD-10-CM | POA: Diagnosis not present

## 2017-07-29 DIAGNOSIS — M25562 Pain in left knee: Secondary | ICD-10-CM | POA: Insufficient documentation

## 2017-07-29 NOTE — Therapy (Signed)
[facility] [address] [place], [address] Phone: [phone]   Fax:  [phone]  Physical Therapy Evaluation  Patient Details  Name: Whitney [name] MRN: [id number] Date of Birth: 05-22-1951 Referring Provider: Kurtis Bushman MD   Encounter Date: 07/29/2017  PT End of Session - 07/29/17 0943    Visit Number  1    Number of Visits  12    Date for PT Re-Evaluation  09/09/17    Authorization Type  1    Authorization Time Period  10 (G codes)    PT Start Time  0918    PT Stop Time  1017    PT Time Calculation (min)  59 min    Activity Tolerance  Patient tolerated treatment well    Behavior During Therapy  [facility] for tasks assessed/performed       Past Medical History:  Diagnosis Date  . Abnormal Pap smear of cervix    Pt states she had colposcopy   . Arthritis   . Chronic kidney disease   . Hx of unilateral nephrectomy 1979   Left Nephrectomy  . Hypertension   . PMB (postmenopausal bleeding)   . Restless leg syndrome   . Vitamin D deficiency     Past Surgical History:  Procedure Laterality Date  . HYSTEROSCOPY W/D&C N/A 02/16/2017   Procedure: DILATATION AND CURETTAGE /HYSTEROSCOPY;  Surgeon: Defrancesco, Alanda Slim, MD;  Location: [facility];  Service: Gynecology;  Laterality: N/A;  . KIDNEY SURGERY Left 1979   left kidney removed  . TOTAL KNEE ARTHROPLASTY Left 07/06/2017   Procedure: TOTAL KNEE ARTHROPLASTY;  Surgeon: Lovell Sheehan, MD;  Location: [facility];  Service: Orthopedics;  Laterality: Left;  . WRIST ARTHROSCOPY Left 1970s    There were no vitals filed for this visit.   Subjective Assessment - 07/29/17 1038    Subjective  Patient reports having stiffness and weakness inf left LE s/p TKA and right knee is painful and weak as well as she is planning on TKA for that knee in the future. She is using a rolling walker and having some difficulty with household activities and unable to walk  without AD at this time.     Pertinent History  s/p TKA 07/06/2017; right knee arthritis with weakness as well with reports of right and left leg "giving way" intermittently    Limitations  Sitting;Walking;Standing;House hold activities    Patient Stated Goals  improve strength and motion left knee, walk without AD    Currently in Pain?  Yes    Pain Score  3     Pain Location  Knee    Pain Orientation  Left    Pain Descriptors / Indicators  Aching    Pain Type  Acute pain;Surgical pain 07/06/2017    Pain Onset  1 to 4 weeks ago    Pain Frequency  Intermittent    Aggravating Factors   standing, sitting, walking    Pain Relieving Factors  rest, elevation, ice    Effect of Pain on Daily Activities  limited in all household and community activties         [facility] PT Assessment - 07/29/17 0928      Assessment   Medical Diagnosis  History of left total knee replacement    Referring Provider  Kurtis Bushman MD    Onset Date/Surgical Date  07/06/17    Hand Dominance  Right    Prior Therapy  rehabilitation center following  hospital stay       Precautions   Precautions  Fall      Restrictions   Weight Bearing Restrictions  No      Balance Screen   Has the patient fallen in the past 6 months  Yes    How many times?  3 most recent within last month     Has the patient had a decrease in activity level because of a fear of falling?   Yes    Is the patient reluctant to leave their home because of a fear of falling?   No      Home Environment   Living Environment  Private residence    Living Arrangements  Other relatives    Type of Home  Apartment    Home Access  Level entry    Home Layout  One level    [place] - 2 wheels;Tub bench;Toilet riser;Other (comment) Hurry cane      Prior Function   Level of Independence  Independent    Vocation  Retired worked as Community education officer, worked at [employer]  spend time with family, gardening       Cognition    Overall Cognitive Status  Within Functional Limits for tasks assessed      Observation/Other Assessments   Lower Extremity Functional Scale   26/80      ROM / Strength   AROM / PROM / Strength  AROM;Strength      AROM   Overall AROM Comments  left knee 0-103 degrees flexion      Strength   Overall Strength  Deficits    Overall Strength Comments  left LE hip abduction decreased strenth grossly 4-/5, left knee flexoin 4-/5, extension4-/5, left hip extension grossly 4-/5      Ambulation/Gait   Gait Pattern  Antalgic; decreased push off left LE, decreased hip/knee flexion left, using rolling walker for support     Standardized Balance Assessment   10 Meter Walk  15 seconds (.87m/s)      Observation:  Well healed incision with good mobility of scar anterior aspect left knee Palpation: mild warmth noted left knee as compared to right    Objective measurements completed on examination: See above findings.    Treatment:  Therapeutic exercise: patient performed with guidance, demonstration, verbal cues of therapist:  Sitting:  SLR left and right LE 3 x each, hold 5 seconds with repeated demonstration and VC Hip adduction with ball between knees with glute sets x 10 reps Roll ball under foot x 10 reps Standing at counter for controlled motion hip abduction and extension: 3 x 3 reps each LE with alternating LE's abduction then extension  NuStep x 5 min. Level #3 intensity with VC and demonstration for correct technique and LE alignment  Patient response to treatment: patient demonstrated improved technique with exercises with moderate VC and repeated demonstration for correct alignment/technique. Improved motor control with repetition    PT Education - 07/29/17 1046    Education provided  Yes    Education Details  POC, home exercises: seated SLR, hip adduction with ball and glute sets, standing hip abduction/extension with controlled motion, ball rolling under foot, ice and  elevation to control swelling /inflammation    Person(s) Educated  Patient    Methods  Explanation;Demonstration;Verbal cues;Handout    Comprehension  Verbalized understanding;Returned demonstration;Verbal cues required;Need further instruction          PT Long Term Goals -  07/29/17 1344      PT LONG TERM GOAL #1   Title  Pt will improve 10 MWS to .8 M/S to improve functional activity tolerance/gait    Baseline  15 seconds (.67 m/s and using rolling walker)    Status  New    Target Date  08/26/17      PT LONG TERM GOAL #2   Title  Pt will improve 10 MWS to 1.0 M/S to improve functional activity tolerance/gait    Baseline  15 seconds (.46m/s)    Status  New    Target Date  09/09/17      PT LONG TERM GOAL #3   Title  Patient will be able to ambulate independently with least restrictive AD safely on level surfaces, uneven surfaces and curbs to improve functional tasks at home and community    Baseline  rolling walker, limited function at home and community    Status  New    Target Date  09/09/17      PT LONG TERM GOAL #4   Title  LEFS improved to 35/80 demonstrating improved self perceived disabiltiy with daily tasks     Baseline  LEFS 26/80    Status  New    Target Date  08/26/17      PT LONG TERM GOAL #5   Title  LEFS improved to 45/80 or better demonstrating improved self perceived disabiltiy with daily tasks     Baseline  LEFS 26/80    Status  New    Target Date  09/09/17      PT LONG TERM GOAL #6   Title  Patient will be independent with home program for ROM, strengthening exercises in order to transition to self managment once discharged from physical therapy    Baseline  requires guidance, verbal cues to perform all exercises    Time  6    Period  Weeks    Status  New             Plan - 07/29/17 1047    Clinical Impression Statement  Patient is a 66 year old female who presents s/p left TKA with decreased ROM and strength in left LE and decreased function  with walking and daily tasks. She has LEFS score of 26/80 indicating severe self perceived disability. she has limited knowledge of appropriate pain control strategies, exercise and progression in order to achieve maximal return to function and will therefor benefit from physical therapy intervention.     History and Personal Factors relevant to plan of care:  s/p TKA left 07/06/2017; arthritis with right knee painful and weak with "giving way' intermittently    Clinical Presentation  Evolving    Clinical Presentation due to:  s/p TKA left 07/06/2017    Clinical Decision Making  Low    Rehab Potential  Good    Clinical Impairments Affecting Rehab Potential  (+) motivated, acute condition (-) arthritis right knee with pain    PT Frequency  2x / week    PT Duration  6 weeks    PT Treatment/Interventions  Electrical Stimulation;Cryotherapy;Moist Heat;Gait training;Stair training;Therapeutic activities;Therapeutic exercise;Balance training;Patient/family education;Neuromuscular re-education;Manual techniques    PT Next Visit Plan  exercise progression for ROM, strength    PT Home Exercise Plan  see patient education    Consulted and Agree with Plan of Care  Patient       Patient will benefit from skilled therapeutic intervention in order to improve the following deficits and impairments:  Pain, Decreased activity tolerance, Decreased endurance, Decreased range of motion, Decreased strength, Impaired perceived functional ability, Difficulty walking  Visit Diagnosis: Difficulty in walking, not elsewhere classified - Plan: PT plan of care cert/re-cert  Muscle weakness (generalized) - Plan: PT plan of care cert/re-cert  Acute pain of left knee - Plan: PT plan of care cert/re-cert  G-Codes - 09/98/33 1353    Functional Assessment Tool Used (Outpatient Only)  LEFS, 10MW, ROM, strength, pain, clinical judgment    Functional Limitation  Mobility: Walking and moving around    Mobility: Walking and  Moving Around Current Status (A2505)  At least 60 percent but less than 80 percent impaired, limited or restricted    Mobility: Walking and Moving Around Goal Status [phone])  At least 1 percent but less than 20 percent impaired, limited or restricted        Problem List Patient Active Problem List   Diagnosis Date Noted  . Total knee replacement status, left 07/06/2017  . Chest pain 06/29/2017  . Postop check 02/25/2017  . Obesity (BMI 35.0-39.9 without comorbidity) 07/15/2016  . Endometrial polyp 07/15/2016  . Class 3 obesity due to excess calories with body mass index (BMI) of 50.0 to 59.9 in adult 07/15/2016  . Family history of breast cancer in first degree relative 07/15/2016  . Family history of ovarian cancer 07/15/2016    Jomarie Longs PT 07/29/2017, 3:07 PM  [facility] [address], [place], [address] Phone: [phone]   Fax:  [phone]  Name: Laquinta Hazell MRN: [id number] Date of Birth: [birth year]

## 2017-08-03 ENCOUNTER — Ambulatory Visit: Payer: Medicare Other

## 2017-08-06 ENCOUNTER — Ambulatory Visit: Payer: Medicare Other | Attending: Orthopedic Surgery

## 2017-08-06 DIAGNOSIS — R262 Difficulty in walking, not elsewhere classified: Secondary | ICD-10-CM | POA: Diagnosis not present

## 2017-08-06 DIAGNOSIS — M25562 Pain in left knee: Secondary | ICD-10-CM | POA: Insufficient documentation

## 2017-08-06 DIAGNOSIS — M25551 Pain in right hip: Secondary | ICD-10-CM | POA: Diagnosis present

## 2017-08-06 DIAGNOSIS — M6281 Muscle weakness (generalized): Secondary | ICD-10-CM | POA: Diagnosis present

## 2017-08-06 NOTE — Therapy (Signed)
[facility] [address] [place], [address] Phone: [phone]   Fax:  [phone]  Physical Therapy Treatment  Patient Details  Name: Whitney [name] MRN: [id number] Date of Birth: 08/29/1950 Referring Provider: Kurtis Bushman MD   Encounter Date: 08/06/2017  PT End of Session - 08/06/17 1615    Visit Number  2    Number of Visits  12    Date for PT Re-Evaluation  09/09/17    PT Start Time  1430    PT Stop Time  1515    PT Time Calculation (min)  45 min    Activity Tolerance  Patient tolerated treatment well    Behavior During Therapy  [facility] for tasks assessed/performed       Past Medical History:  Diagnosis Date  . Abnormal Pap smear of cervix    Pt states she had colposcopy   . Arthritis   . Chronic kidney disease   . Hx of unilateral nephrectomy 1979   Left Nephrectomy  . Hypertension   . PMB (postmenopausal bleeding)   . Restless leg syndrome   . Vitamin D deficiency     Past Surgical History:  Procedure Laterality Date  . HYSTEROSCOPY W/D&C N/A 02/16/2017   Procedure: DILATATION AND CURETTAGE /HYSTEROSCOPY;  Surgeon: Defrancesco, Alanda Slim, MD;  Location: [facility];  Service: Gynecology;  Laterality: N/A;  . KIDNEY SURGERY Left 1979   left kidney removed  . TOTAL KNEE ARTHROPLASTY Left 07/06/2017   Procedure: TOTAL KNEE ARTHROPLASTY;  Surgeon: Lovell Sheehan, MD;  Location: [facility];  Service: Orthopedics;  Laterality: Left;  . WRIST ARTHROSCOPY Left 1970s    There were no vitals filed for this visit.  Subjective Assessment - 08/06/17 1456    Subjective  Patient reports she is having increased hip pain and the knee is improving but continues to hurt. Patient reports she will need another TKA on the unaffected side this year.     Pertinent History  s/p TKA 07/06/2017; right knee arthritis with weakness as well with reports of right and left leg "giving way" intermittently    Limitations   Sitting;Walking;Standing;House hold activities    Patient Stated Goals  improve strength and motion left knee, walk without AD    Currently in Pain?  Yes    Pain Score  5     Pain Location  Knee    Pain Orientation  Left    Pain Descriptors / Indicators  Aching    Pain Type  Acute pain;Surgical pain    Pain Onset  1 to 4 weeks ago    Pain Frequency  Intermittent        TREATMENT Therapeutic Exercise: Widened tandem weight shifts in standing - x20 Mini squats with UE support - 2 x 20 Hip abduction in standing - x20 B Hip machine hip abduction - 2 x 12 B 40# Walking with focus on improving heel strike - 2 x 63ft with use of SPC  Manual therapy: STM to hamstrings/calves with the patient positioned in sitting to decrease muscular spasms and pain on the affected side utilizing superficial techniques  Patient demonstrates increased fatigue at the end of the session   PT Education - 08/06/17 1614    Education provided  Yes    Education Details  Educated on [employer] with ambulation performance    Person(s) Educated  Patient    Methods  Explanation;Demonstration    Comprehension  Verbalized understanding;Returned demonstration  PT Long Term Goals - 07/29/17 1344      PT LONG TERM GOAL #1   Title  Pt will improve 10 MWS to .8 M/S to improve functional activity tolerance/gait    Baseline  15 seconds (.67 m/s and using rolling walker)    Status  New    Target Date  08/26/17      PT LONG TERM GOAL #2   Title  Pt will improve 10 MWS to 1.0 M/S to improve functional activity tolerance/gait    Baseline  15 seconds (.99m/s)    Status  New    Target Date  09/09/17      PT LONG TERM GOAL #3   Title  Patient will be able to ambulate independently with least restrictive AD safely on level surfaces, uneven surfaces and curbs to improve functional tasks at home and community    Baseline  rolling walker, limited function at home and community    Status  New    Target  Date  09/09/17      PT LONG TERM GOAL #4   Title  LEFS improved to 35/80 demonstrating improved self perceived disabiltiy with daily tasks     Baseline  LEFS 26/80    Status  New    Target Date  08/26/17      PT LONG TERM GOAL #5   Title  LEFS improved to 45/80 or better demonstrating improved self perceived disabiltiy with daily tasks     Baseline  LEFS 26/80    Status  New    Target Date  09/09/17      PT LONG TERM GOAL #6   Title  Patient will be independent with home program for ROM, strengthening exercises in order to transition to self managment once discharged from physical therapy    Baseline  requires guidance, verbal cues to perform all exercises    Time  6    Period  Weeks    Status  New            Plan - 08/06/17 1615    Clinical Impression Statement  Patient demonstrates increased difficulty with performing ambulation today with a positive trendelenberg, and upper body sway with ambulating. Patient demonstrates improvement in body sway and a less antalgic gait after cueing to improve heel strike and weight progression on the affected side. Patient will benefit from further skilled therapy to return to prior level of function.     Rehab Potential  Good    Clinical Impairments Affecting Rehab Potential  (+) motivated, acute condition (-) arthritis right knee with pain    PT Frequency  2x / week    PT Duration  6 weeks    PT Treatment/Interventions  Electrical Stimulation;Cryotherapy;Moist Heat;Gait training;Stair training;Therapeutic activities;Therapeutic exercise;Balance training;Patient/family education;Neuromuscular re-education;Manual techniques    PT Next Visit Plan  exercise progression for ROM, strength    PT Home Exercise Plan  see patient education    Consulted and Agree with Plan of Care  Patient       Patient will benefit from skilled therapeutic intervention in order to improve the following deficits and impairments:  Pain, Decreased activity  tolerance, Decreased endurance, Decreased range of motion, Decreased strength, Impaired perceived functional ability, Difficulty walking  Visit Diagnosis: Difficulty in walking, not elsewhere classified  Muscle weakness (generalized)  Acute pain of left knee     Problem List Patient Active Problem List   Diagnosis Date Noted  . Total knee replacement status, left 07/06/2017  .  Chest pain 06/29/2017  . Postop check 02/25/2017  . Obesity (BMI 35.0-39.9 without comorbidity) 07/15/2016  . Endometrial polyp 07/15/2016  . Class 3 obesity due to excess calories with body mass index (BMI) of 50.0 to 59.9 in adult 07/15/2016  . Family history of breast cancer in first degree relative 07/15/2016  . Family history of ovarian cancer 07/15/2016    Blythe Stanford, PT DPT 08/06/2017, 4:18 PM  [facility] [address] [place], [address] Phone: [phone]   Fax:  [phone]  Name: Whitney [name] MRN: [id number] Date of Birth: 1951/04/29

## 2017-08-10 ENCOUNTER — Ambulatory Visit: Payer: Medicare Other

## 2017-08-10 DIAGNOSIS — M25562 Pain in left knee: Secondary | ICD-10-CM

## 2017-08-10 DIAGNOSIS — M6281 Muscle weakness (generalized): Secondary | ICD-10-CM

## 2017-08-10 DIAGNOSIS — R262 Difficulty in walking, not elsewhere classified: Secondary | ICD-10-CM | POA: Diagnosis not present

## 2017-08-10 DIAGNOSIS — M25551 Pain in right hip: Secondary | ICD-10-CM | POA: Diagnosis not present

## 2017-08-10 NOTE — Therapy (Signed)
[facility] [address] [place], [address] Phone: [phone]   Fax:  [phone]  Physical Therapy Treatment  Patient Details  Name: Whitney [name] MRN: [id number] Date of Birth: August 16, 1950 Referring Provider: Kurtis Bushman MD   Encounter Date: 08/10/2017  PT End of Session - 08/10/17 1519    Visit Number  3    Number of Visits  12    Date for PT Re-Evaluation  09/09/17    PT Start Time  1430    PT Stop Time  1515    PT Time Calculation (min)  45 min    Activity Tolerance  Patient tolerated treatment well    Behavior During Therapy  [facility] for tasks assessed/performed       Past Medical History:  Diagnosis Date  . Abnormal Pap smear of cervix    Pt states she had colposcopy   . Arthritis   . Chronic kidney disease   . Hx of unilateral nephrectomy 1979   Left Nephrectomy  . Hypertension   . PMB (postmenopausal bleeding)   . Restless leg syndrome   . Vitamin D deficiency     Past Surgical History:  Procedure Laterality Date  . HYSTEROSCOPY W/D&C N/A 02/16/2017   Procedure: DILATATION AND CURETTAGE /HYSTEROSCOPY;  Surgeon: Defrancesco, Alanda Slim, MD;  Location: [facility];  Service: Gynecology;  Laterality: N/A;  . KIDNEY SURGERY Left 1979   left kidney removed  . TOTAL KNEE ARTHROPLASTY Left 07/06/2017   Procedure: TOTAL KNEE ARTHROPLASTY;  Surgeon: Lovell Sheehan, MD;  Location: [facility];  Service: Orthopedics;  Laterality: Left;  . WRIST ARTHROSCOPY Left 1970s    There were no vitals filed for this visit.  Subjective Assessment - 08/10/17 1515    Subjective  Patient reports increased lower leg pain which is increased by moving her foot to the left and right. Patient reports the knee is hurting  along the lateral and medial aspect of her knee.     Pertinent History  s/p TKA 07/06/2017; right knee arthritis with weakness as well with reports of right and left leg "giving way" intermittently    Limitations  Sitting;Walking;Standing;House hold activities    Patient Stated Goals  improve strength and motion left knee, walk without AD    Currently in Pain?  Yes    Pain Score  10-Worst pain ever    Pain Location  Knee    Pain Orientation  Medial;Lateral    Pain Descriptors / Indicators  Aching    Pain Type  Acute pain    Pain Onset  More than a month ago    Pain Frequency  Intermittent       TREATMENT Therapeutic Exercise: Hip abduction in standing - 2x20 B Heel/Toe raises in standing - x 20  Plantarflexion/dorsiflexion in sitting  - x 20 Ambulation with FWW with focus on improving heel strike and decreased Trendelenburg gait pattern Leg Press in sitting at [place] -  2 x 10 55#  Ambulation without UE support - 6 x 70ft with focus on improving hip positioning and core activation   Manual therapy: STM to calves, peroneals, deep flexors of the foot/ankle with the patient positioned in sitting to decrease muscular spasms and pain on the affected side utilizing superficial techniques   Patient demonstrates increased fatigue at the end of the session   PT Education - 08/10/17 1518    Education provided  Yes    Education Details  form/ technique  with exercise; monitoring symptoms along lower leg    Person(s) Educated  Patient    Methods  Explanation;Demonstration    Comprehension  Verbalized understanding;Returned demonstration          PT Long Term Goals - 07/29/17 1344      PT LONG TERM GOAL #1   Title  Pt will improve 10 MWS to .8 M/S to improve functional activity tolerance/gait    Baseline  15 seconds (.67 m/s and using rolling walker)    Status  New    Target Date  08/26/17      PT LONG TERM GOAL #2   Title  Pt will improve 10 MWS to 1.0 M/S to improve functional activity tolerance/gait    Baseline  15 seconds (.54m/s)    Status  New    Target Date  09/09/17      PT LONG TERM GOAL #3   Title  Patient will be able to ambulate independently with least  restrictive AD safely on level surfaces, uneven surfaces and curbs to improve functional tasks at home and community    Baseline  rolling walker, limited function at home and community    Status  New    Target Date  09/09/17      PT LONG TERM GOAL #4   Title  LEFS improved to 35/80 demonstrating improved self perceived disabiltiy with daily tasks     Baseline  LEFS 26/80    Status  New    Target Date  08/26/17      PT LONG TERM GOAL #5   Title  LEFS improved to 45/80 or better demonstrating improved self perceived disabiltiy with daily tasks     Baseline  LEFS 26/80    Status  New    Target Date  09/09/17      PT LONG TERM GOAL #6   Title  Patient will be independent with home program for ROM, strengthening exercises in order to transition to self managment once discharged from physical therapy    Baseline  requires guidance, verbal cues to perform all exercises    Time  6    Period  Weeks    Status  New            Plan - 08/10/17 1519    Clinical Impression Statement  Patient's pain is improved with manual therapy to the peroneals and deep flexors of the lower leg/foot indicating muscular involvement for LE pain. Patient demosntrates improvement in tendelemberg sign when ambulating indicating improvement in muscular coordination. However, patient demonstrates increased general LE weakness but most notably along quadriceps musculature. Patient will benefit from further skilled therapy to return to prior level of function.     Clinical Decision Making  Low    Clinical Impairments Affecting Rehab Potential  (+) motivated, acute condition (-) arthritis right knee with pain    PT Frequency  2x / week    PT Duration  6 weeks    PT Treatment/Interventions  Electrical Stimulation;Cryotherapy;Moist Heat;Gait training;Stair training;Therapeutic activities;Therapeutic exercise;Balance training;Patient/family education;Neuromuscular re-education;Manual techniques    PT Next Visit Plan   exercise progression for ROM, strength    PT Home Exercise Plan  see patient education    Consulted and Agree with Plan of Care  Patient       Patient will benefit from skilled therapeutic intervention in order to improve the following deficits and impairments:  Pain, Decreased activity tolerance, Decreased endurance, Decreased range of motion, Decreased strength, Impaired perceived functional ability,  Difficulty walking  Visit Diagnosis: Difficulty in walking, not elsewhere classified  Muscle weakness (generalized)  Acute pain of left knee     Problem List Patient Active Problem List   Diagnosis Date Noted  . Total knee replacement status, left 07/06/2017  . Chest pain 06/29/2017  . Postop check 02/25/2017  . Obesity (BMI 35.0-39.9 without comorbidity) 07/15/2016  . Endometrial polyp 07/15/2016  . Class 3 obesity due to excess calories with body mass index (BMI) of 50.0 to 59.9 in adult 07/15/2016  . Family history of breast cancer in first degree relative 07/15/2016  . Family history of ovarian cancer 07/15/2016    Blythe Stanford, PT DPT 08/10/2017, 4:08 PM  [facility] [address], [place], [address] Phone: [phone]   Fax:  [phone]  Name: Whitney [name] MRN: [id number] Date of Birth: 07-26-1951

## 2017-08-13 ENCOUNTER — Ambulatory Visit: Payer: Medicare Other

## 2017-08-13 DIAGNOSIS — R262 Difficulty in walking, not elsewhere classified: Secondary | ICD-10-CM

## 2017-08-13 DIAGNOSIS — M25562 Pain in left knee: Secondary | ICD-10-CM | POA: Diagnosis not present

## 2017-08-13 DIAGNOSIS — M6281 Muscle weakness (generalized): Secondary | ICD-10-CM | POA: Diagnosis not present

## 2017-08-13 DIAGNOSIS — Z96652 Presence of left artificial knee joint: Secondary | ICD-10-CM | POA: Diagnosis not present

## 2017-08-13 DIAGNOSIS — M25551 Pain in right hip: Secondary | ICD-10-CM | POA: Diagnosis not present

## 2017-08-13 NOTE — Therapy (Signed)
[facility] [address], [place], [address] Phone: [phone]   Fax:  [phone]  Physical Therapy Treatment  Patient Details  Name: Whitney [name] MRN: [id number] Date of Birth: [birth year] Referring Provider: Kurtis Bushman MD   Encounter Date: 08/13/2017  PT End of Session - 08/13/17 1442    Visit Number  4    Number of Visits  12    Date for PT Re-Evaluation  09/09/17    PT Start Time  2633    PT Stop Time  1500    PT Time Calculation (min)  45 min    Activity Tolerance  Patient tolerated treatment well    Behavior During Therapy  [facility] for tasks assessed/performed       Past Medical History:  Diagnosis Date  . Abnormal Pap smear of cervix    Pt states she had colposcopy   . Arthritis   . Chronic kidney disease   . Hx of unilateral nephrectomy 1979   Left Nephrectomy  . Hypertension   . PMB (postmenopausal bleeding)   . Restless leg syndrome   . Vitamin D deficiency     Past Surgical History:  Procedure Laterality Date  . HYSTEROSCOPY W/D&C N/A 02/16/2017   Procedure: DILATATION AND CURETTAGE /HYSTEROSCOPY;  Surgeon: Defrancesco, Alanda Slim, MD;  Location: [facility];  Service: Gynecology;  Laterality: N/A;  . KIDNEY SURGERY Left 1979   left kidney removed  . TOTAL KNEE ARTHROPLASTY Left 07/06/2017   Procedure: TOTAL KNEE ARTHROPLASTY;  Surgeon: Lovell Sheehan, MD;  Location: [facility];  Service: Orthopedics;  Laterality: Left;  . WRIST ARTHROSCOPY Left 1970s    There were no vitals filed for this visit.  Subjective Assessment - 08/13/17 1440    Subjective  Patient reports her lower leg pain is improved to day versus the previous session. Patient states improvement with knee function and would like to continue working on ambulating.     Pertinent History  s/p TKA 07/06/2017; right knee arthritis with weakness as well with reports of right and left leg "giving way" intermittently    Limitations   Sitting;Walking;Standing;House hold activities    Patient Stated Goals  improve strength and motion left knee, walk without AD    Currently in Pain?  Yes    Pain Score  1     Pain Location  Knee    Pain Orientation  Medial;Lateral    Pain Descriptors / Indicators  Aching    Pain Type  Acute pain    Pain Onset  More than a month ago    Pain Frequency  Intermittent       TREATMENT Therapeutic Exercise: Hip abduction in standing - 2x20 B Heel/Toe raises in standing - x 10  Sit to stand without UE support - 2 x 20  Ambulation without UE support - 4 x 71ft with focus on improving hip positioning and core activation Single leg stance with UE support - x 20; with 5 sec holds Tandem stance - 4 x 74ft ambulation   Manual therapy: STM to the hamstrings with the patient positioned in sitting to decrease muscular spasms and pain on the affected side utilizing superficial techniques   Patient demonstrates increased fatigue at the end of the session   PT Education - 08/13/17 1442    Education provided  Yes    Education Details  Sit to stand without UE support     Person(s) Educated  Patient  Methods  Explanation;Demonstration    Comprehension  Returned demonstration;Verbalized understanding          PT Long Term Goals - 07/29/17 1344      PT LONG TERM GOAL #1   Title  Pt will improve 10 MWS to .8 M/S to improve functional activity tolerance/gait    Baseline  15 seconds (.67 m/s and using rolling walker)    Status  New    Target Date  08/26/17      PT LONG TERM GOAL #2   Title  Pt will improve 10 MWS to 1.0 M/S to improve functional activity tolerance/gait    Baseline  15 seconds (.65m/s)    Status  New    Target Date  09/09/17      PT LONG TERM GOAL #3   Title  Patient will be able to ambulate independently with least restrictive AD safely on level surfaces, uneven surfaces and curbs to improve functional tasks at home and community    Baseline  rolling walker, limited  function at home and community    Status  New    Target Date  09/09/17      PT LONG TERM GOAL #4   Title  LEFS improved to 35/80 demonstrating improved self perceived disabiltiy with daily tasks     Baseline  LEFS 26/80    Status  New    Target Date  08/26/17      PT LONG TERM GOAL #5   Title  LEFS improved to 45/80 or better demonstrating improved self perceived disabiltiy with daily tasks     Baseline  LEFS 26/80    Status  New    Target Date  09/09/17      PT LONG TERM GOAL #6   Title  Patient will be independent with home program for ROM, strengthening exercises in order to transition to self managment once discharged from physical therapy    Baseline  requires guidance, verbal cues to perform all exercises    Time  6    Period  Weeks    Status  New            Plan - 08/13/17 1449    Clinical Impression Statement  Patient demonstrates improvement with ambulation quality with improved ability to walk with less postural sway. Patient demonstrates decreased hip strength and coordination and will benefit from further skilled therapy to return to prior level of function.     Clinical Impairments Affecting Rehab Potential  (+) motivated, acute condition (-) arthritis right knee with pain    PT Frequency  2x / week    PT Duration  6 weeks    PT Treatment/Interventions  Electrical Stimulation;Cryotherapy;Moist Heat;Gait training;Stair training;Therapeutic activities;Therapeutic exercise;Balance training;Patient/family education;Neuromuscular re-education;Manual techniques    PT Next Visit Plan  exercise progression for ROM, strength    PT Home Exercise Plan  see patient education    Consulted and Agree with Plan of Care  Patient       Patient will benefit from skilled therapeutic intervention in order to improve the following deficits and impairments:  Pain, Decreased activity tolerance, Decreased endurance, Decreased range of motion, Decreased strength, Impaired perceived  functional ability, Difficulty walking  Visit Diagnosis: Difficulty in walking, not elsewhere classified  Muscle weakness (generalized)  Acute pain of left knee     Problem List Patient Active Problem List   Diagnosis Date Noted  . Total knee replacement status, left 07/06/2017  . Chest pain 06/29/2017  . Postop  check 02/25/2017  . Obesity (BMI 35.0-39.9 without comorbidity) 07/15/2016  . Endometrial polyp 07/15/2016  . Class 3 obesity due to excess calories with body mass index (BMI) of 50.0 to 59.9 in adult 07/15/2016  . Family history of breast cancer in first degree relative 07/15/2016  . Family history of ovarian cancer 07/15/2016    Blythe Stanford, PT DPT 08/13/2017, 3:04 PM  [facility] [address], [place], [address] Phone: [phone]   Fax:  [phone]  Name: Juni Glaab MRN: [id number] Date of Birth: 1950-08-14

## 2017-08-14 ENCOUNTER — Other Ambulatory Visit: Payer: Self-pay | Admitting: Nurse Practitioner

## 2017-08-14 DIAGNOSIS — M545 Low back pain, unspecified: Secondary | ICD-10-CM

## 2017-08-14 MED ORDER — TRAMADOL HCL 50 MG PO TABS: 50.0000 mg | ORAL_TABLET | Freq: Two times a day (BID) | ORAL | 1 refills | Status: DC | PRN

## 2017-08-14 NOTE — Progress Notes (Signed)
Hey. I sent her tramadol refill BID prn pain to [facility] [address].

## 2017-08-17 ENCOUNTER — Ambulatory Visit: Payer: Medicare Other

## 2017-08-19 DIAGNOSIS — R29898 Other symptoms and signs involving the musculoskeletal system: Secondary | ICD-10-CM | POA: Diagnosis not present

## 2017-08-19 DIAGNOSIS — M79604 Pain in right leg: Secondary | ICD-10-CM | POA: Diagnosis not present

## 2017-08-19 DIAGNOSIS — M79605 Pain in left leg: Secondary | ICD-10-CM | POA: Diagnosis not present

## 2017-08-20 ENCOUNTER — Ambulatory Visit: Payer: Medicare Other

## 2017-08-27 ENCOUNTER — Ambulatory Visit: Payer: Medicare Other

## 2017-08-27 DIAGNOSIS — M25562 Pain in left knee: Secondary | ICD-10-CM

## 2017-08-27 DIAGNOSIS — R262 Difficulty in walking, not elsewhere classified: Secondary | ICD-10-CM | POA: Diagnosis not present

## 2017-08-27 DIAGNOSIS — M6281 Muscle weakness (generalized): Secondary | ICD-10-CM

## 2017-08-27 DIAGNOSIS — M25551 Pain in right hip: Secondary | ICD-10-CM | POA: Diagnosis not present

## 2017-08-27 NOTE — Therapy (Signed)
[facility] [address] [place], [address] Phone: [phone]   Fax:  [phone]  Physical Therapy Treatment  Patient Details  Name: Whitney [name] MRN: [id number] Date of Birth: 1951/04/06 Referring Provider: Kurtis Bushman MD   Encounter Date: 08/27/2017  PT End of Session - 08/27/17 1353    Visit Number  5    Number of Visits  12    Date for PT Re-Evaluation  09/09/17    PT Start Time  1950    PT Stop Time  1350    PT Time Calculation (min)  45 min    Activity Tolerance  Patient tolerated treatment well    Behavior During Therapy  [facility] for tasks assessed/performed       Past Medical History:  Diagnosis Date  . Abnormal Pap smear of cervix    Pt states she had colposcopy   . Arthritis   . Chronic kidney disease   . Hx of unilateral nephrectomy 1979   Left Nephrectomy  . Hypertension   . PMB (postmenopausal bleeding)   . Restless leg syndrome   . Vitamin D deficiency     Past Surgical History:  Procedure Laterality Date  . HYSTEROSCOPY W/D&C N/A 02/16/2017   Procedure: DILATATION AND CURETTAGE /HYSTEROSCOPY;  Surgeon: Defrancesco, Alanda Slim, MD;  Location: [facility];  Service: Gynecology;  Laterality: N/A;  . KIDNEY SURGERY Left 1979   left kidney removed  . TOTAL KNEE ARTHROPLASTY Left 07/06/2017   Procedure: TOTAL KNEE ARTHROPLASTY;  Surgeon: Lovell Sheehan, MD;  Location: [facility];  Service: Orthopedics;  Laterality: Left;  . WRIST ARTHROSCOPY Left 1970s    There were no vitals filed for this visit.  Subjective Assessment - 08/27/17 1316    Subjective  Patient reports she has been sick the past two weeks and reports she has had increased hip pain for the past week. Patient states she has continued to use the FWW for ambulating.    Pertinent History  s/p TKA 07/06/2017; right knee arthritis with weakness as well with reports of right and left leg "giving way" intermittently    Limitations   Sitting;Walking;Standing;House hold activities    Patient Stated Goals  improve strength and motion left knee, walk without AD    Currently in Pain?  Yes    Pain Score  5     Pain Location  Knee    Pain Orientation  Anterior    Pain Descriptors / Indicators  Aching    Pain Type  Acute pain    Pain Onset  More than a month ago       TREATMENT Therapeutic Exercise: Hip extension in standing - x20 B Sit to stand without UE support -  x 20, 2 x 10  Ambulation without UE support - x34ft with focus on improving hip positioning and core activation Standing marches to improve hip stabilization - 2 x 10  Seated Leg Press at [place] 85# B LE; unilaterally 45# x10 each side Tandem stance rotational movements - x15      Patient demonstrates increased fatigue at the end of the session     PT Education - 08/27/17 1350    Education provided  Yes    Education Details  HEP: hip extension in standing    Person(s) Educated  Patient    Methods  Explanation;Demonstration    Comprehension  Verbalized understanding;Returned demonstration          PT  Long Term Goals - 07/29/17 1344      PT LONG TERM GOAL #1   Title  Pt will improve 10 MWS to .8 M/S to improve functional activity tolerance/gait    Baseline  15 seconds (.67 m/s and using rolling walker)    Status  New    Target Date  08/26/17      PT LONG TERM GOAL #2   Title  Pt will improve 10 MWS to 1.0 M/S to improve functional activity tolerance/gait    Baseline  15 seconds (.41m/s)    Status  New    Target Date  09/09/17      PT LONG TERM GOAL #3   Title  Patient will be able to ambulate independently with least restrictive AD safely on level surfaces, uneven surfaces and curbs to improve functional tasks at home and community    Baseline  rolling walker, limited function at home and community    Status  New    Target Date  09/09/17      PT LONG TERM GOAL #4   Title  LEFS improved to 35/80 demonstrating improved self  perceived disabiltiy with daily tasks     Baseline  LEFS 26/80    Status  New    Target Date  08/26/17      PT LONG TERM GOAL #5   Title  LEFS improved to 45/80 or better demonstrating improved self perceived disabiltiy with daily tasks     Baseline  LEFS 26/80    Status  New    Target Date  09/09/17      PT LONG TERM GOAL #6   Title  Patient will be independent with home program for ROM, strengthening exercises in order to transition to self managment once discharged from physical therapy    Baseline  requires guidance, verbal cues to perform all exercises    Time  6    Period  Weeks    Status  New            Plan - 08/27/17 1354    Clinical Impression Statement  Patient continues to demonstrate increased trunk sway with ambulation with B trendlenberg indicating increased hip weakness B. Patient demonstrates increased radicular pain down the L LE which is improved after performing extension based exercises indicating directional perference of the lumbar spine. Patient demonstrates poor motor control and decreased hip strength and will benefit from further skilled therapy to return to prior level of function.     Clinical Impairments Affecting Rehab Potential  (+) motivated, acute condition (-) arthritis right knee with pain    PT Frequency  2x / week    PT Duration  6 weeks    PT Treatment/Interventions  Electrical Stimulation;Cryotherapy;Moist Heat;Gait training;Stair training;Therapeutic activities;Therapeutic exercise;Balance training;Patient/family education;Neuromuscular re-education;Manual techniques    PT Next Visit Plan  exercise progression for ROM, strength    PT Home Exercise Plan  see patient education    Consulted and Agree with Plan of Care  Patient       Patient will benefit from skilled therapeutic intervention in order to improve the following deficits and impairments:  Pain, Decreased activity tolerance, Decreased endurance, Decreased range of motion, Decreased  strength, Impaired perceived functional ability, Difficulty walking  Visit Diagnosis: Difficulty in walking, not elsewhere classified  Muscle weakness (generalized)  Acute pain of left knee     Problem List Patient Active Problem List   Diagnosis Date Noted  . Total knee replacement status, left 07/06/2017  .  Chest pain 06/29/2017  . Postop check 02/25/2017  . Obesity (BMI 35.0-39.9 without comorbidity) 07/15/2016  . Endometrial polyp 07/15/2016  . Class 3 obesity due to excess calories with body mass index (BMI) of 50.0 to 59.9 in adult 07/15/2016  . Family history of breast cancer in first degree relative 07/15/2016  . Family history of ovarian cancer 07/15/2016    Blythe Stanford, PT DPT 08/27/2017, 1:58 PM  [facility] [address] [place], [address] Phone: [phone]   Fax:  [phone]  Name: Morrisa Aldaba MRN: [id number] Date of Birth: 03-Mar-1951

## 2017-08-31 ENCOUNTER — Ambulatory Visit: Payer: Medicare Other

## 2017-08-31 DIAGNOSIS — M25551 Pain in right hip: Secondary | ICD-10-CM | POA: Diagnosis not present

## 2017-08-31 DIAGNOSIS — M6281 Muscle weakness (generalized): Secondary | ICD-10-CM | POA: Diagnosis not present

## 2017-08-31 DIAGNOSIS — R262 Difficulty in walking, not elsewhere classified: Secondary | ICD-10-CM

## 2017-08-31 DIAGNOSIS — M25562 Pain in left knee: Secondary | ICD-10-CM | POA: Diagnosis not present

## 2017-08-31 NOTE — Therapy (Signed)
[facility] [address], [place], [address] Phone: [phone]   Fax:  [phone]  Physical Therapy Treatment  Patient Details  Name: Whitney [name] MRN: [id number] Date of Birth: 26-Oct-1950 Referring Provider: Kurtis Bushman MD   Encounter Date: 08/31/2017  PT End of Session - 08/31/17 1440    Visit Number  6    Number of Visits  12    Date for PT Re-Evaluation  09/09/17    PT Start Time  7253    PT Stop Time  1430    PT Time Calculation (min)  45 min    Activity Tolerance  Patient tolerated treatment well    Behavior During Therapy  [facility] for tasks assessed/performed       Past Medical History:  Diagnosis Date  . Abnormal Pap smear of cervix    Pt states she had colposcopy   . Arthritis   . Chronic kidney disease   . Hx of unilateral nephrectomy 1979   Left Nephrectomy  . Hypertension   . PMB (postmenopausal bleeding)   . Restless leg syndrome   . Vitamin D deficiency     Past Surgical History:  Procedure Laterality Date  . HYSTEROSCOPY W/D&C N/A 02/16/2017   Procedure: DILATATION AND CURETTAGE /HYSTEROSCOPY;  Surgeon: Defrancesco, Alanda Slim, MD;  Location: [facility];  Service: Gynecology;  Laterality: N/A;  . KIDNEY SURGERY Left 1979   left kidney removed  . TOTAL KNEE ARTHROPLASTY Left 07/06/2017   Procedure: TOTAL KNEE ARTHROPLASTY;  Surgeon: Lovell Sheehan, MD;  Location: [facility] ORS;  Service: Orthopedics;  Laterality: Left;  . WRIST ARTHROSCOPY Left 1970s    There were no vitals filed for this visit.  Subjective Assessment - 08/31/17 1349    Subjective  Patient reports leg pain today. Patient reports her left knee has been hurting since Thursday and that she believes pain is worse from stress [name] to family emergency.    Pertinent History  s/p TKA 07/06/2017; right knee arthritis with weakness as well with reports of right and left leg "giving way" intermittently    Limitations   Sitting;Walking;Standing;House hold activities;Other (comment)    How long can you sit comfortably?  forever    How long can you stand comfortably?  5 minutes    How long can you walk comfortably?  "not at all"    Diagnostic tests  imaging - see scanned docs    Patient Stated Goals  improve strength and motion left knee, walk without AD    Currently in Pain?  Yes    Pain Score  5     Pain Location  Knee    Pain Orientation  Anterior    Pain Descriptors / Indicators  Aching    Pain Type  Acute pain    Pain Onset  More than a month ago        TREATMENT  Manual therapy: performed with patient in sitting  Scar tissue massage using circular and rolling technique - to decrease pain and increase mobility of tissue  STM of quad, hamstrings L knee utilizing a superficial technique- to decrease pain and muscular spasm  Therapeutic exercise:  Seated Single Leg Press at [facility] - 45# 2x20 B, to increase strength and endurance of the quadriceps    Tandem stance rotational movements bilaterally - x15 to increase endurance of the hip musculature  Nustep level 6 without UE support - 6 min to increase quad strength and endurance  Patient reports decreased pain at end of session.      PT Education - 08/31/17 1439    Education provided  Yes    Education Details  Form/technique with exercise    Person(s) Educated  Patient    Methods  Explanation;Demonstration    Comprehension  Verbalized understanding;Returned demonstration          PT Long Term Goals - 07/29/17 1344      PT LONG TERM GOAL #1   Title  Pt will improve 10 MWS to .8 M/S to improve functional activity tolerance/gait    Baseline  15 seconds (.67 m/s and using rolling walker)    Status  New    Target Date  08/26/17      PT LONG TERM GOAL #2   Title  Pt will improve 10 MWS to 1.0 M/S to improve functional activity tolerance/gait    Baseline  15 seconds (.102m/s)    Status  New    Target Date  09/09/17      PT  LONG TERM GOAL #3   Title  Patient will be able to ambulate independently with least restrictive AD safely on level surfaces, uneven surfaces and curbs to improve functional tasks at home and community    Baseline  rolling walker, limited function at home and community    Status  New    Target Date  09/09/17      PT LONG TERM GOAL #4   Title  LEFS improved to 35/80 demonstrating improved self perceived disabiltiy with daily tasks     Baseline  LEFS 26/80    Status  New    Target Date  08/26/17      PT LONG TERM GOAL #5   Title  LEFS improved to 45/80 or better demonstrating improved self perceived disabiltiy with daily tasks     Baseline  LEFS 26/80    Status  New    Target Date  09/09/17      PT LONG TERM GOAL #6   Title  Patient will be independent with home program for ROM, strengthening exercises in order to transition to self managment once discharged from physical therapy    Baseline  requires guidance, verbal cues to perform all exercises    Time  6    Period  Weeks    Status  New            Plan - 08/31/17 1441    Clinical Impression Statement  Patient is able to perform greater amount of repetitions with performing single leg-press exercise compared to previous session, indicating increased strength and endurance of the L quad. Although patient demonstrates improvement, patient still demonstrates poor balance most notably with narrow BOS, requiring frequent UE support, indicating poor static balance and poor proprioception.  Patient will benefit from further skilled therapy to return to prior levels of function.    Clinical Impairments Affecting Rehab Potential  (+) motivated, acute condition (-) arthritis right knee with pain    PT Frequency  2x / week    PT Duration  6 weeks    PT Treatment/Interventions  Electrical Stimulation;Cryotherapy;Moist Heat;Gait training;Stair training;Therapeutic activities;Therapeutic exercise;Balance training;Patient/family  education;Neuromuscular re-education;Manual techniques    PT Next Visit Plan  exercise progression for ROM, strength    PT Home Exercise Plan  see patient education    Consulted and Agree with Plan of Care  Patient       Patient will benefit from skilled therapeutic intervention in order to improve  the following deficits and impairments:  Pain, Decreased activity tolerance, Decreased endurance, Decreased range of motion, Decreased strength, Impaired perceived functional ability, Difficulty walking  Visit Diagnosis: Acute pain of left knee  Pain in right hip  Muscle weakness (generalized)  Difficulty in walking, not elsewhere classified     Problem List Patient Active Problem List   Diagnosis Date Noted  . Total knee replacement status, left 07/06/2017  . Chest pain 06/29/2017  . Postop check 02/25/2017  . Obesity (BMI 35.0-39.9 without comorbidity) 07/15/2016  . Endometrial polyp 07/15/2016  . Class 3 obesity [name] to excess calories with body mass index (BMI) of 50.0 to 59.9 in adult 07/15/2016  . Family history of breast cancer in first degree relative 07/15/2016  . Family history of ovarian cancer 07/15/2016    Ricard Dillon, SPT 08/31/2017, 5:16 PM  [facility] [address] [place], [address] Phone: [phone]   Fax:  [phone]  Name: Whitney [name] MRN: [id number] Date of Birth: 1950/12/07

## 2017-09-02 ENCOUNTER — Encounter: Payer: Medicare Other | Admitting: Obstetrics and Gynecology

## 2017-09-03 ENCOUNTER — Ambulatory Visit: Payer: Medicare Other

## 2017-09-07 ENCOUNTER — Ambulatory Visit: Payer: Medicare Other

## 2017-09-10 ENCOUNTER — Ambulatory Visit: Payer: Medicare Other

## 2017-09-14 ENCOUNTER — Ambulatory Visit: Payer: Medicare Other | Attending: Orthopedic Surgery

## 2017-09-14 DIAGNOSIS — M25551 Pain in right hip: Secondary | ICD-10-CM | POA: Insufficient documentation

## 2017-09-14 DIAGNOSIS — G8929 Other chronic pain: Secondary | ICD-10-CM | POA: Insufficient documentation

## 2017-09-14 DIAGNOSIS — M25562 Pain in left knee: Secondary | ICD-10-CM | POA: Diagnosis present

## 2017-09-14 DIAGNOSIS — M6281 Muscle weakness (generalized): Secondary | ICD-10-CM | POA: Diagnosis present

## 2017-09-14 DIAGNOSIS — R262 Difficulty in walking, not elsewhere classified: Secondary | ICD-10-CM | POA: Diagnosis present

## 2017-09-14 DIAGNOSIS — M5441 Lumbago with sciatica, right side: Secondary | ICD-10-CM | POA: Insufficient documentation

## 2017-09-14 NOTE — Therapy (Addendum)
[facility] [address], [place], [address] Phone: [phone]   Fax:  [phone]  Physical Therapy Treatment  Patient Details  Name: Whitney [name] MRN: [id number] Date of Birth: 08/13/1950 Referring Provider: Kurtis Bushman MD   Encounter Date: 09/14/2017  PT End of Session - 09/14/17 1354    Visit Number  7    Number of Visits  12    Date for PT Re-Evaluation  10/12/17    PT Start Time  3419    PT Stop Time  1354    PT Time Calculation (min)  56 min    Activity Tolerance  Patient tolerated treatment well    Behavior During Therapy  [facility] for tasks assessed/performed       Past Medical History:  Diagnosis Date  . Abnormal Pap smear of cervix    Pt states she had colposcopy   . Arthritis   . Chronic kidney disease   . Hx of unilateral nephrectomy 1979   Left Nephrectomy  . Hypertension   . PMB (postmenopausal bleeding)   . Restless leg syndrome   . Vitamin D deficiency     Past Surgical History:  Procedure Laterality Date  . HYSTEROSCOPY W/D&C N/A 02/16/2017   Procedure: DILATATION AND CURETTAGE /HYSTEROSCOPY;  Surgeon: Defrancesco, Alanda Slim, MD;  Location: [facility];  Service: Gynecology;  Laterality: N/A;  . KIDNEY SURGERY Left 1979   left kidney removed  . TOTAL KNEE ARTHROPLASTY Left 07/06/2017   Procedure: TOTAL KNEE ARTHROPLASTY;  Surgeon: Lovell Sheehan, MD;  Location: [facility];  Service: Orthopedics;  Laterality: Left;  . WRIST ARTHROSCOPY Left 1970s    There were no vitals filed for this visit.  Subjective Assessment - 09/14/17 1307    Subjective  Patient reports no current leg pain today and states she has not been performing her exercises at home. Patient states she had to travel to [place] due to family issues.     Pertinent History  s/p TKA 07/06/2017; right knee arthritis with weakness as well with reports of right and left leg "giving way" intermittently    Limitations   Sitting;Walking;Standing;House hold activities;Other (comment)    How long can you sit comfortably?  forever    How long can you stand comfortably?  5 minutes    How long can you walk comfortably?  "not at all"    Diagnostic tests  imaging - see scanned docs    Patient Stated Goals  improve strength and motion left knee, walk without AD    Currently in Pain?  No/denies    Pain Onset  More than a month ago       TREATMENT   Therapeutic exercise:  Sit to stands without UE support from raised height - 2 x 20   Hip abduction at hip machine - 2 x 15 B 40#  Ambulation 1078ft with cueing to improve body positioning and sway  Ambulation 134ft with use of walker and cueing to improve ability to increase stride and body positioning   Seated double Leg Press at [facility] - 85# 2x15 B, to increase strength and endurance of the quadriceps     Patient demonstrates increased fatigue with exercise   PT Education - 09/14/17 1319    Education provided  Yes    Education Details  form/technique with exercise    Person(s) Educated  Patient    Methods  Explanation;Demonstration    Comprehension  Verbalized understanding;Returned demonstration  PT Long Term Goals - 09/14/17 1439      PT LONG TERM GOAL #1   Title  Pt will improve 10 MWS to .8 M/S to improve functional activity tolerance/gait    Baseline  15 seconds (.67 m/s and using rolling walker); .8 m/s with FWW 09/14/17    Status  On-going      PT LONG TERM GOAL #2   Title  Pt will improve 10 MWS to 1.0 M/S to improve functional activity tolerance/gait    Baseline  15 seconds (.73m/s); 09/14/17: .82 m/s without AD    Status  On-going      PT LONG TERM GOAL #3   Title  Patient will be able to ambulate independently with least restrictive AD safely on level surfaces, uneven surfaces and curbs to improve functional tasks at home and community    Baseline  rolling walker, limited function at home and community; 09/14/17 requires use  of FWW for long distances    Status  On-going      PT LONG TERM GOAL #4   Title  LEFS improved to 35/80 demonstrating improved self perceived disabiltiy with daily tasks     Baseline  LEFS 26/80; 09/14/17: defer until next visit    Status  New      PT LONG TERM GOAL #5   Title  LEFS improved to 45/80 or better demonstrating improved self perceived disabiltiy with daily tasks     Baseline  LEFS 26/80; 09/14/17: defer until next visit    Status  New      Additional Long Term Goals   Additional Long Term Goals  Yes      PT LONG TERM GOAL #6   Title  Patient will be independent with home program for ROM, strengthening exercises in order to transition to self managment once discharged from physical therapy    Baseline  requires guidance, verbal cues to perform all exercises; Requires cueing to perform walking with postural sway    Time  6    Period  Weeks    Status  On-going      PT LONG TERM GOAL #7   Title  Patient will improve her 59minWT to >1044ft with least restrictive AD to improve ability to ambulate in the community without need for rest breaks.    Baseline  750 ft amb    Time  6    Period  Weeks    Status  New            Plan - 09/14/17 1507    Clinical Impression Statement  Patient demonstrates improvement and progress towards long term goals with greater 52mWT and less pain compared to previous and initial visits. Patient also demonstrates improvement in knee AROM with WNL knee AROM (knee ext: 0, knee flex: 110). Although patient is improving, she demonstrates decreased muscular endurance and poor postural control as indicated with abberrant movements with ambulation and poor 81minWT. Patient will benefit from further skilled therapy to return to prior level of function.     Clinical Impairments Affecting Rehab Potential  (+) motivated, acute condition (-) arthritis right knee with pain    PT Frequency  2x / week    PT Duration  6 weeks    PT Treatment/Interventions   Electrical Stimulation;Cryotherapy;Moist Heat;Gait training;Stair training;Therapeutic activities;Therapeutic exercise;Balance training;Patient/family education;Neuromuscular re-education;Manual techniques    PT Next Visit Plan  exercise progression for ROM, strength    PT Home Exercise Plan  see patient education  Consulted and Agree with Plan of Care  Patient       Patient will benefit from skilled therapeutic intervention in order to improve the following deficits and impairments:  Pain, Decreased activity tolerance, Decreased endurance, Decreased range of motion, Decreased strength, Impaired perceived functional ability, Difficulty walking  Visit Diagnosis: Pain in right hip - Plan: PT plan of care cert/re-cert  Chronic pain of left knee - Plan: PT plan of care cert/re-cert  Muscle weakness (generalized) - Plan: PT plan of care cert/re-cert  Difficulty in walking, not elsewhere classified - Plan: PT plan of care cert/re-cert     Problem List Patient Active Problem List   Diagnosis Date Noted  . Total knee replacement status, left 07/06/2017  . Chest pain 06/29/2017  . Postop check 02/25/2017  . Obesity (BMI 35.0-39.9 without comorbidity) 07/15/2016  . Endometrial polyp 07/15/2016  . Class 3 obesity due to excess calories with body mass index (BMI) of 50.0 to 59.9 in adult 07/15/2016  . Family history of breast cancer in first degree relative 07/15/2016  . Family history of ovarian cancer 07/15/2016    Blythe Stanford, PT DPT 09/14/2017, 3:12 PM  [facility] [address] [place], [address] Phone: [phone]   Fax:  [phone]  Name: Whitney [name] MRN: [id number] Date of Birth: 17-Mar-1951

## 2017-09-17 ENCOUNTER — Ambulatory Visit: Payer: Medicare Other

## 2017-09-17 DIAGNOSIS — G8929 Other chronic pain: Secondary | ICD-10-CM | POA: Diagnosis not present

## 2017-09-17 DIAGNOSIS — M25562 Pain in left knee: Secondary | ICD-10-CM

## 2017-09-17 DIAGNOSIS — M6281 Muscle weakness (generalized): Secondary | ICD-10-CM

## 2017-09-17 DIAGNOSIS — R262 Difficulty in walking, not elsewhere classified: Secondary | ICD-10-CM | POA: Diagnosis not present

## 2017-09-17 DIAGNOSIS — M25551 Pain in right hip: Secondary | ICD-10-CM

## 2017-09-17 DIAGNOSIS — M5441 Lumbago with sciatica, right side: Secondary | ICD-10-CM | POA: Diagnosis not present

## 2017-09-17 NOTE — Therapy (Signed)
[facility] [address] [place], [address] Phone: [phone]   Fax:  [phone]  Physical Therapy Treatment  Patient Details  Name: Whitney [name] MRN: [id number] Date of Birth: Dec 31, 1950 Referring Provider: Kurtis Bushman MD   Encounter Date: 09/17/2017  PT End of Session - 09/17/17 1424    Visit Number  8    Number of Visits  12    Date for PT Re-Evaluation  10/12/17    PT Start Time  4401    PT Stop Time  1415    PT Time Calculation (min)  60 min    Activity Tolerance  Patient tolerated treatment well    Behavior During Therapy  [facility] for tasks assessed/performed       Past Medical History:  Diagnosis Date  . Abnormal Pap smear of cervix    Pt states she had colposcopy   . Arthritis   . Chronic kidney disease   . Hx of unilateral nephrectomy 1979   Left Nephrectomy  . Hypertension   . PMB (postmenopausal bleeding)   . Restless leg syndrome   . Vitamin D deficiency     Past Surgical History:  Procedure Laterality Date  . HYSTEROSCOPY W/D&C N/A 02/16/2017   Procedure: DILATATION AND CURETTAGE /HYSTEROSCOPY;  Surgeon: Defrancesco, Alanda Slim, MD;  Location: [facility]S;  Service: Gynecology;  Laterality: N/A;  . KIDNEY SURGERY Left 1979   left kidney removed  . TOTAL KNEE ARTHROPLASTY Left 07/06/2017   Procedure: TOTAL KNEE ARTHROPLASTY;  Surgeon: Lovell Sheehan, MD;  Location: [facility] OR[facility];  Service: Orthopedics;  Laterality: Left;  . WRIST ARTHROSCOPY Left 1970s    There were no vitals filed for this visit.  Subjective Assessment - 09/17/17 1318    Subjective  Pt reports she walked for "35 minutes" yetserday at [facility] with just her walker, and that her R LE did not start hurting til the very end of her trip. PT reports no pain currently.    Pertinent History  s/p TKA 07/06/2017; right knee arthritis with weakness as well with reports of right and left leg "giving way" intermittently    Limitations   Sitting;Walking;Standing;House hold activities;Other (comment)    How long can you sit comfortably?  forever    How long can you stand comfortably?  5 minutes    How long can you walk comfortably?  "not at all"    Diagnostic tests  imaging - see scanned docs    Patient Stated Goals  improve strength and motion left knee, walk without AD    Currently in Pain?  No/denies    Pain Onset  More than a month ago        TREATMENT   Therapeutic exercise:  Seated double Leg Press at [facility] - 85# 1x25,1x25 88# to increase strength and endurance of the quadriceps    Slider curtsy squat - 1x15 B to increase strength of the hip musculature   Mini squats - 1x25 to increase LE strength bilaterally  Monster walks with GTB - 2x3 min to increase strength of the hip abductors  Sit to stands without UE support  - 2x15 to increase LE strength  Ambulation with single point cane - 250 ft with cueing to improve body positioning and sway    Patient demonstrates increased fatigue with exercise at end of session.      PT Education - 09/17/17 1423    Education provided  Yes    Education Details  HEP: Mini squats with UE support    Person(s) Educated  Patient    Methods  Explanation;Demonstration    Comprehension  Verbalized understanding;Returned demonstration          PT Long Term Goals - 09/14/17 1439      PT LONG TERM GOAL #1   Title  Pt will improve 10 MWS to .8 M/S to improve functional activity tolerance/gait    Baseline  15 seconds (.67 m/s and using rolling walker); .8 m/s with FWW 09/14/17    Status  On-going      PT LONG TERM GOAL #2   Title  Pt will improve 10 MWS to 1.0 M/S to improve functional activity tolerance/gait    Baseline  15 seconds (.7m/s); 09/14/17: .82 m/s without AD    Status  On-going      PT LONG TERM GOAL #3   Title  Patient will be able to ambulate independently with least restrictive AD safely on level surfaces, uneven surfaces and curbs to improve  functional tasks at home and community    Baseline  rolling walker, limited function at home and community; 09/14/17 requires use of FWW for long distances    Status  On-going      PT LONG TERM GOAL #4   Title  LEFS improved to 35/80 demonstrating improved self perceived disabiltiy with daily tasks     Baseline  LEFS 26/80; 09/14/17: defer until next visit    Status  New      PT LONG TERM GOAL #5   Title  LEFS improved to 45/80 or better demonstrating improved self perceived disabiltiy with daily tasks     Baseline  LEFS 26/80; 09/14/17: defer until next visit    Status  New      Additional Long Term Goals   Additional Long Term Goals  Yes      PT LONG TERM GOAL #6   Title  Patient will be independent with home program for ROM, strengthening exercises in order to transition to self managment once discharged from physical therapy    Baseline  requires guidance, verbal cues to perform all exercises; Requires cueing to perform walking with postural sway    Time  6    Period  Weeks    Status  On-going      PT LONG TERM GOAL #7   Title  Patient will improve her 39minWT to >1099ft with least restrictive AD to improve ability to ambulate in the community without need for rest breaks.    Baseline  750 ft amb    Time  6    Period  Weeks    Status  New            Plan - 09/17/17 1425    Clinical Impression Statement  Patient demonstrates overall improvement with performing STS without a raised seat with increased speed, indicating increased LE strength and endurance. Although patient demonstrates improvement, pt continues to require frequent cueing to correct poor postural control, indicated by continued aberrant movement with ambulation. Pt will continue to benefit from further skilled therapy to return to prior levels of function.    Clinical Impairments Affecting Rehab Potential  (+) motivated, acute condition (-) arthritis right knee with pain    PT Frequency  2x / week    PT Duration   6 weeks    PT Treatment/Interventions  Electrical Stimulation;Cryotherapy;Moist Heat;Gait training;Stair training;Therapeutic activities;Therapeutic exercise;Balance training;Patient/family education;Neuromuscular re-education;Manual techniques    PT Next Visit Plan  exercise progression for ROM, strength    PT Home Exercise Plan  see patient education    Consulted and Agree with Plan of Care  Patient       Patient will benefit from skilled therapeutic intervention in order to improve the following deficits and impairments:  Pain, Decreased activity tolerance, Decreased endurance, Decreased range of motion, Decreased strength, Impaired perceived functional ability, Difficulty walking  Visit Diagnosis: Pain in right hip  Chronic pain of left knee  Muscle weakness (generalized)     Problem List Patient Active Problem List   Diagnosis Date Noted  . Total knee replacement status, left 07/06/2017  . Chest pain 06/29/2017  . Postop check 02/25/2017  . Obesity (BMI 35.0-39.9 without comorbidity) 07/15/2016  . Endometrial polyp 07/15/2016  . Class 3 obesity due to excess calories with body mass index (BMI) of 50.0 to 59.9 in adult 07/15/2016  . Family history of breast cancer in first degree relative 07/15/2016  . Family history of ovarian cancer 07/15/2016    Blythe Stanford, PT DPT 09/17/2017, 2:32 PM  [facility] [address], [place], [address] Phone: [phone]   Fax:  [phone]  Name: Tyne Banta MRN: [id number] Date of Birth: 07/30/1951

## 2017-09-23 ENCOUNTER — Ambulatory Visit: Payer: Medicare Other

## 2017-09-23 VITALS — BP 160/100

## 2017-09-23 DIAGNOSIS — R262 Difficulty in walking, not elsewhere classified: Secondary | ICD-10-CM

## 2017-09-23 DIAGNOSIS — M6281 Muscle weakness (generalized): Secondary | ICD-10-CM

## 2017-09-23 DIAGNOSIS — M25551 Pain in right hip: Secondary | ICD-10-CM | POA: Diagnosis not present

## 2017-09-23 DIAGNOSIS — M25562 Pain in left knee: Secondary | ICD-10-CM | POA: Diagnosis not present

## 2017-09-23 DIAGNOSIS — M5441 Lumbago with sciatica, right side: Secondary | ICD-10-CM | POA: Diagnosis not present

## 2017-09-23 DIAGNOSIS — G8929 Other chronic pain: Secondary | ICD-10-CM | POA: Diagnosis not present

## 2017-09-23 NOTE — Therapy (Signed)
[facility] [address], [place], [address] Phone: [phone]   Fax:  [phone]  Physical Therapy Treatment  Patient Details  Name: Whitney [name] MRN: [id number] Date of Birth: 1951/08/01 Referring Provider: Kurtis Bushman MD   Encounter Date: 09/23/2017  PT End of Session - 09/23/17 1126    Visit Number  9    Number of Visits  12    Date for PT Re-Evaluation  10/12/17    PT Start Time  5397    PT Stop Time  1120    PT Time Calculation (min)  35 min    Activity Tolerance  Patient tolerated treatment well    Behavior During Therapy  [facility] for tasks assessed/performed       Past Medical History:  Diagnosis Date  . Abnormal Pap smear of cervix    Pt states she had colposcopy   . Arthritis   . Chronic kidney disease   . Hx of unilateral nephrectomy 1979   Left Nephrectomy  . Hypertension   . PMB (postmenopausal bleeding)   . Restless leg syndrome   . Vitamin D deficiency     Past Surgical History:  Procedure Laterality Date  . HYSTEROSCOPY W/D&C N/A 02/16/2017   Procedure: DILATATION AND CURETTAGE /HYSTEROSCOPY;  Surgeon: Defrancesco, Alanda Slim, MD;  Location: [facility];  Service: Gynecology;  Laterality: N/A;  . KIDNEY SURGERY Left 1979   left kidney removed  . TOTAL KNEE ARTHROPLASTY Left 07/06/2017   Procedure: TOTAL KNEE ARTHROPLASTY;  Surgeon: Lovell Sheehan, MD;  Location: [facility];  Service: Orthopedics;  Laterality: Left;  . WRIST ARTHROSCOPY Left 1970s    Vitals:   09/23/17 1046  BP: (!) 160/100    Subjective Assessment - 09/23/17 1124    Subjective  Pt reports no hip pain currently today. Pt states that she has been "good" overall, but that R hand has been bothering her.    Pertinent History  s/p TKA 07/06/2017; right knee arthritis with weakness as well with reports of right and left leg "giving way" intermittently    Limitations  Sitting;Walking;Standing;House hold activities;Other  (comment)    How long can you sit comfortably?  forever    How long can you stand comfortably?  5 minutes    How long can you walk comfortably?  "not at all"    Diagnostic tests  imaging - see scanned docs    Patient Stated Goals  improve strength and motion left knee, walk without AD    Currently in Pain?  No/denies    Pain Onset  More than a month ago       Therapeutic exercise:   Seated hip abduction with GTB - 2x20 to increase strength of the hip abductors  Seated glute squeezes with ball - 2x20 to increase glute activation and endurance, and with focus on squeezing the adductors as well  Supine TA sets - 1x20 to increase activation of the TA  Glute bridges - 1x20 to increase strength and endurance of the glutes  Pt. demonstrates increased fatigue at end of session.      PT Education - 09/23/17 1126    Education provided  Yes    Education Details  Form/technqiue with exercise    Person(s) Educated  Patient    Methods  Explanation    Comprehension  Verbalized understanding;Returned demonstration          PT Long Term Goals - 09/14/17 1439  PT LONG TERM GOAL #1   Title  Pt will improve 10 MWS to .8 M/S to improve functional activity tolerance/gait    Baseline  15 seconds (.67 m/s and using rolling walker); .8 m/s with FWW 09/14/17    Status  On-going      PT LONG TERM GOAL #2   Title  Pt will improve 10 MWS to 1.0 M/S to improve functional activity tolerance/gait    Baseline  15 seconds (.71m/s); 09/14/17: .82 m/s without AD    Status  On-going      PT LONG TERM GOAL #3   Title  Patient will be able to ambulate independently with least restrictive AD safely on level surfaces, uneven surfaces and curbs to improve functional tasks at home and community    Baseline  rolling walker, limited function at home and community; 09/14/17 requires use of FWW for long distances    Status  On-going      PT LONG TERM GOAL #4   Title  LEFS improved to 35/80 demonstrating  improved self perceived disabiltiy with daily tasks     Baseline  LEFS 26/80; 09/14/17: defer until next visit    Status  New      PT LONG TERM GOAL #5   Title  LEFS improved to 45/80 or better demonstrating improved self perceived disabiltiy with daily tasks     Baseline  LEFS 26/80; 09/14/17: defer until next visit    Status  New      Additional Long Term Goals   Additional Long Term Goals  Yes      PT LONG TERM GOAL #6   Title  Patient will be independent with home program for ROM, strengthening exercises in order to transition to self managment once discharged from physical therapy    Baseline  requires guidance, verbal cues to perform all exercises; Requires cueing to perform walking with postural sway    Time  6    Period  Weeks    Status  On-going      PT LONG TERM GOAL #7   Title  Patient will improve her 49minWT to >1039ft with least restrictive AD to improve ability to ambulate in the community without need for rest breaks.    Baseline  750 ft amb    Time  6    Period  Weeks    Status  New            Plan - 09/23/17 1123    Clinical Impression Statement  PT regressed exercise this session secondary to pt's blood pressure reading. Physical therapist told patient to go to ER after session, or if pt does not go to ER to follow up with physician immediately regarding recurring high blood pressure.     Clinical Impairments Affecting Rehab Potential  (+) motivated, acute condition (-) arthritis right knee with pain    PT Frequency  2x / week    PT Duration  6 weeks    PT Treatment/Interventions  Electrical Stimulation;Cryotherapy;Moist Heat;Gait training;Stair training;Therapeutic activities;Therapeutic exercise;Balance training;Patient/family education;Neuromuscular re-education;Manual techniques    PT Next Visit Plan  exercise progression for ROM, strength    PT Home Exercise Plan  see patient education    Consulted and Agree with Plan of Care  Patient       Patient  will benefit from skilled therapeutic intervention in order to improve the following deficits and impairments:  Pain, Decreased activity tolerance, Decreased endurance, Decreased range of motion, Decreased strength, Impaired perceived  functional ability, Difficulty walking  Visit Diagnosis: Pain in right hip  Difficulty in walking, not elsewhere classified  Muscle weakness (generalized)     Problem List Patient Active Problem List   Diagnosis Date Noted  . Total knee replacement status, left 07/06/2017  . Chest pain 06/29/2017  . Postop check 02/25/2017  . Obesity (BMI 35.0-39.9 without comorbidity) 07/15/2016  . Endometrial polyp 07/15/2016  . Class 3 obesity due to excess calories with body mass index (BMI) of 50.0 to 59.9 in adult 07/15/2016  . Family history of breast cancer in first degree relative 07/15/2016  . Family history of ovarian cancer 07/15/2016    Ricard Dillon, SPT 09/23/2017, 11:29 AM  [place] [facility] [address], [place], [address] Phone: [phone]   Fax:  [phone]  Name: Whitney [name] MRN: [id number] Date of Birth: Feb 14, 1951

## 2017-09-24 ENCOUNTER — Ambulatory Visit: Payer: Medicare Other

## 2017-09-24 DIAGNOSIS — M25562 Pain in left knee: Secondary | ICD-10-CM

## 2017-09-24 NOTE — Therapy (Signed)
[place] [address], [place], [address] Phone: [phone]   Fax:  [phone]  Patient Details  Name: Whitney [name] MRN: [id number] Date of Birth: [birth year] Referring Provider:  Lovell Sheehan, MD  Encounter Date: 09/24/2017   Did not perform therapy today secondary to BP reading of 156/106 - 160/136mmHg. Patient reports increased headache which is improved with lying down; advised patient to go to ER especially if there is a worsening of symptoms. Patient demonstrates verbal confirmation of this information. Advised patient to not perform there ex at home. Did not perform therapy today.  Ricard Dillon, SPT 09/24/2017, 9:30 AM  [place] [facility] [address] [place], [address] Phone: [phone]   Fax:  [phone]

## 2017-09-28 ENCOUNTER — Ambulatory Visit: Payer: Medicare Other | Admitting: Physical Therapy

## 2017-09-28 DIAGNOSIS — M6281 Muscle weakness (generalized): Secondary | ICD-10-CM

## 2017-09-28 DIAGNOSIS — M25551 Pain in right hip: Secondary | ICD-10-CM

## 2017-09-28 DIAGNOSIS — G8929 Other chronic pain: Secondary | ICD-10-CM

## 2017-09-28 DIAGNOSIS — M5441 Lumbago with sciatica, right side: Secondary | ICD-10-CM

## 2017-09-28 DIAGNOSIS — R262 Difficulty in walking, not elsewhere classified: Secondary | ICD-10-CM

## 2017-09-28 DIAGNOSIS — M25562 Pain in left knee: Secondary | ICD-10-CM

## 2017-09-28 NOTE — Therapy (Signed)
[facility] [facility] [address], [place], [address] Phone: [phone]   Fax:  [phone]  Physical Therapy Cancellation Note  Patient Details  Name: Whitney [name] MRN: [id number] Date of Birth: [birth year] Referring Provider: Kurtis Bushman MD   Encounter Date: 09/28/2017    Past Medical History:  Diagnosis Date  . Abnormal Pap smear of cervix    Pt states she had colposcopy   . Arthritis   . Chronic kidney disease   . Hx of unilateral nephrectomy 1979   Left Nephrectomy  . Hypertension   . PMB (postmenopausal bleeding)   . Restless leg syndrome   . Vitamin D deficiency     Past Surgical History:  Procedure Laterality Date  . HYSTEROSCOPY W/D&C N/A 02/16/2017   Procedure: DILATATION AND CURETTAGE /HYSTEROSCOPY;  Surgeon: Defrancesco, Alanda Slim, MD;  Location: [facility];  Service: Gynecology;  Laterality: N/A;  . KIDNEY SURGERY Left 1979   left kidney removed  . TOTAL KNEE ARTHROPLASTY Left 07/06/2017   Procedure: TOTAL KNEE ARTHROPLASTY;  Surgeon: Lovell Sheehan, MD;  Location: [facility];  Service: Orthopedics;  Laterality: Left;  . WRIST ARTHROSCOPY Left 1970s    There were no vitals filed for this visit.  Subjective Assessment - 09/28/17 1526    Subjective  Pt arrived at clinic for scheduled PT appointment today.  Vitals taken reading BP 164/96, pulse 99, SpO2 100% at rest in a seated position.  Pt reports having "palpitations" this morning which resolved with rest. Pt reports that this past weekend she felt "off" and was having white spots in her vision.  Pt also endorses having sensations of shooting pain down LUE x1 sometime in the past several weeks.  Cancelled PT appointment today and all appointments until she sees her MD, Leretha Pol, on Tuesday, 10/06/17.  Per pt, Dr. Junius Creamer is already aware of pt's elevated BP who told this pt (via telephone) that she is to report to her appointment at Dr. Everlean Cherry  office on 3/5 for follow-up and to avoid exercise when her BP is elevated.  Reviewed signs and symptoms of a heart attack and stroke and instructed pt to call 911 should she experience any of these or if she begins feeling unwell.  This PT called Dr. Everlean Cherry clinic to inform them of pt's symptoms over the past few weeks and of vitals this session.  Spoke with RN at Dr. Everlean Cherry office at 3:14pm on 2/25 who indicated they will plan on seeing pt as scheduled on 3/5.      Pertinent History  s/p TKA 07/06/2017; right knee arthritis with weakness as well with reports of right and left leg "giving way" intermittently    Limitations  Sitting;Walking;Standing;House hold activities;Other (comment)    How long can you sit comfortably?  forever    How long can you stand comfortably?  5 minutes    How long can you walk comfortably?  "not at all"    Diagnostic tests  imaging - see scanned docs    Patient Stated Goals  improve strength and motion left knee, walk without AD    Pain Onset  More than a month ago                                   PT Long Term Goals - 09/14/17 1439      PT LONG TERM GOAL #1  Title  Pt will improve 10 MWS to .8 M/S to improve functional activity tolerance/gait    Baseline  15 seconds (.67 m/s and using rolling walker); .8 m/s with FWW 09/14/17    Status  On-going      PT LONG TERM GOAL #2   Title  Pt will improve 10 MWS to 1.0 M/S to improve functional activity tolerance/gait    Baseline  15 seconds (.47m/s); 09/14/17: .82 m/s without AD    Status  On-going      PT LONG TERM GOAL #3   Title  Patient will be able to ambulate independently with least restrictive AD safely on level surfaces, uneven surfaces and curbs to improve functional tasks at home and community    Baseline  rolling walker, limited function at home and community; 09/14/17 requires use of FWW for long distances    Status  On-going      PT LONG TERM GOAL #4   Title  LEFS improved  to 35/80 demonstrating improved self perceived disabiltiy with daily tasks     Baseline  LEFS 26/80; 09/14/17: defer until next visit    Status  New      PT LONG TERM GOAL #5   Title  LEFS improved to 45/80 or better demonstrating improved self perceived disabiltiy with daily tasks     Baseline  LEFS 26/80; 09/14/17: defer until next visit    Status  New      Additional Long Term Goals   Additional Long Term Goals  Yes      PT LONG TERM GOAL #6   Title  Patient will be independent with home program for ROM, strengthening exercises in order to transition to self managment once discharged from physical therapy    Baseline  requires guidance, verbal cues to perform all exercises; Requires cueing to perform walking with postural sway    Time  6    Period  Weeks    Status  On-going      PT LONG TERM GOAL #7   Title  Patient will improve her 40minWT to >1035ft with least restrictive AD to improve ability to ambulate in the community without need for rest breaks.    Baseline  750 ft amb    Time  6    Period  Weeks    Status  New              Patient will benefit from skilled therapeutic intervention in order to improve the following deficits and impairments:     Visit Diagnosis: Acute pain of left knee  Pain in right hip  Difficulty in walking, not elsewhere classified  Muscle weakness (generalized)  Chronic pain of left knee  Chronic right-sided low back pain with right-sided sciatica     Problem List Patient Active Problem List   Diagnosis Date Noted  . Total knee replacement status, left 07/06/2017  . Chest pain 06/29/2017  . Postop check 02/25/2017  . Obesity (BMI 35.0-39.9 without comorbidity) 07/15/2016  . Endometrial polyp 07/15/2016  . Class 3 obesity due to excess calories with body mass index (BMI) of 50.0 to 59.9 in adult 07/15/2016  . Family history of breast cancer in first degree relative 07/15/2016  . Family history of ovarian cancer 07/15/2016     Collie Siad PT, DPT 09/28/2017, 3:27 PM  [place] [facility] [address] [place], [address] Phone: [phone]   Fax:  [phone]  Name: Whitney [name] MRN: [id number] Date of Birth: [birth year]

## 2017-09-30 ENCOUNTER — Ambulatory Visit: Payer: Medicare Other

## 2017-10-06 ENCOUNTER — Ambulatory Visit (INDEPENDENT_AMBULATORY_CARE_PROVIDER_SITE_OTHER): Payer: Medicare Other | Admitting: Nurse Practitioner

## 2017-10-06 ENCOUNTER — Encounter: Payer: Self-pay | Admitting: Nurse Practitioner

## 2017-10-06 VITALS — BP 146/90 | HR 70 | Resp 16 | Ht 65.0 in | Wt 241.8 lb

## 2017-10-06 DIAGNOSIS — R300 Dysuria: Secondary | ICD-10-CM

## 2017-10-06 DIAGNOSIS — I10 Essential (primary) hypertension: Secondary | ICD-10-CM

## 2017-10-06 DIAGNOSIS — Z1231 Encounter for screening mammogram for malignant neoplasm of breast: Secondary | ICD-10-CM

## 2017-10-06 DIAGNOSIS — Z0001 Encounter for general adult medical examination with abnormal findings: Secondary | ICD-10-CM | POA: Diagnosis not present

## 2017-10-06 DIAGNOSIS — M545 Low back pain, unspecified: Secondary | ICD-10-CM

## 2017-10-06 DIAGNOSIS — Z1239 Encounter for other screening for malignant neoplasm of breast: Secondary | ICD-10-CM

## 2017-10-06 DIAGNOSIS — E559 Vitamin D deficiency, unspecified: Secondary | ICD-10-CM | POA: Diagnosis not present

## 2017-10-06 DIAGNOSIS — E782 Mixed hyperlipidemia: Secondary | ICD-10-CM

## 2017-10-06 MED ORDER — CARVEDILOL 3.125 MG PO TABS
3.1250 mg | ORAL_TABLET | Freq: Two times a day (BID) | ORAL | 3 refills | Status: DC
Start: 2017-10-06 — End: 2018-09-10

## 2017-10-06 MED ORDER — TRAMADOL HCL 50 MG PO TABS: 50.0000 mg | ORAL_TABLET | Freq: Two times a day (BID) | ORAL | 2 refills | Status: DC | PRN

## 2017-10-06 NOTE — Progress Notes (Signed)
[facility] [place], [place] [address]  Internal MEDICINE  Office Visit Note  Patient Name: Whitney [name]  [id number]  [id number]  Date of Service: 10/25/2017  Chief Complaint  Patient presents with  . Osteoarthritis    both hands, especially in the right hand.      The patient is here for annual wellness visit. She did have knee replacement surgery on the left, on July 06, 2018. She is using a walker. Taking physical therapy. Had to be postponed three times in the past week or so due to elevated blood pressure. The patient states that she is not taking her coreg. Was given a few refills when it was first started, but when it ran out, she did not get this filled again.  She is having moderate pain int he joints of the hands, most severe on right side. Started back in December. She did see orthopedics and got a  Shot for this which helped. Pain is starting to return. She is taking gabapentin, prescribed by another provider. She will also take tramadol, which helps the pain. She needs to have new prescription for this today.   Pt is here for routine health maintenance examination  Current Medication: Outpatient Encounter Medications as of 10/06/2017  Medication Sig Note  . acetaminophen (TYLENOL) 325 MG tablet Take 650 mg by mouth 4 (four) times daily as needed.    . carvedilol (COREG) 3.125 MG tablet Take 1 tablet (3.125 mg total) by mouth 2 (two) times daily with a meal.   . rOPINIRole (REQUIP) 0.5 MG tablet Take 0.5 mg at bedtime by mouth. Takes 1 or 2 tablets as needed   . [DISCONTINUED] amLODipine (NORVASC) 10 MG tablet Take 10 mg by mouth daily.  10/19/2017: Patient seems to think physician had instructed her to take [place]. Advised that AMLODIPINE is rarely dosed more than once daily at 10mg  but said I would report to pharmacy.   . [DISCONTINUED] aspirin EC 325 MG EC tablet Take 1 tablet (325 mg total) by mouth daily with breakfast. (Patient not  taking: Reported on 10/19/2017)   . [DISCONTINUED] carvedilol (COREG) 3.125 MG tablet Take 3.125 mg 2 (two) times daily with a meal by mouth.   . [DISCONTINUED] docusate sodium (COLACE) 100 MG capsule Take 1 capsule (100 mg total) by mouth 2 (two) times daily. (Patient not taking: Reported on 10/19/2017)   . [DISCONTINUED] ergocalciferol (VITAMIN D2) 50000 units capsule Take 50,000 Units by mouth every Sunday.    . [DISCONTINUED] famotidine (PEPCID) 20 MG tablet Take 20 mg by mouth daily as needed.    . [DISCONTINUED] gabapentin (NEURONTIN) 300 MG capsule Take 300 mg by mouth 3 (three) times daily.    . [DISCONTINUED] hydrALAZINE (APRESOLINE) 10 MG tablet Take 1 tablet (10 mg total) by mouth 3 (three) times daily. Hold for low blood pressure systolic less than 676 mmHg, diastolic less than 70 mmHg (Patient not taking: Reported on 10/19/2017)   . [DISCONTINUED] lip balm (BLISTEX) OINT Apply liberal amount to cold sores as needed for pain/irritation. Ok to leave at bedside for pt to apply   . [DISCONTINUED] methocarbamol (ROBAXIN) 500 MG tablet Take 500 mg by mouth 3 (three) times daily as needed.    . [DISCONTINUED] ondansetron (ZOFRAN) 4 MG tablet Take 4 mg by mouth every 6 (six) hours as needed for nausea.   . [DISCONTINUED] oxyCODONE (OXY IR/ROXICODONE) 5 MG immediate release tablet Take 5-10 mg by mouth every 4 (four) hours  as needed for moderate pain or severe pain. 1 tab for moderate 2 tabs for severe pain   . [DISCONTINUED] senna (SENOKOT) 8.6 MG TABS tablet Take 2 tablets by mouth 2 (two) times daily.   . [DISCONTINUED] traMADol (ULTRAM) 50 MG tablet Take 1 tablet (50 mg total) by mouth every 12 (twelve) hours as needed.   . [DISCONTINUED] traMADol (ULTRAM) 50 MG tablet Take 1 tablet (50 mg total) by mouth every 12 (twelve) hours as needed.    No facility-administered encounter medications on file as of 10/06/2017.     Surgical History: Past Surgical History:  Procedure Laterality Date  .  HYSTEROSCOPY W/D&C N/A 02/16/2017   Procedure: DILATATION AND CURETTAGE /HYSTEROSCOPY;  Surgeon: Defrancesco, Alanda Slim, MD;  Location: [facility];  Service: Gynecology;  Laterality: N/A;  . KIDNEY SURGERY Left 1979   left kidney removed  . LEFT HEART CATH AND CORONARY ANGIOGRAPHY N/A 10/21/2017   Procedure: LEFT HEART CATH AND CORONARY ANGIOGRAPHY;  Surgeon: Teodoro Spray, MD;  Location: [facility];  Service: Cardiovascular;  Laterality: N/A;  . TOTAL KNEE ARTHROPLASTY Left 07/06/2017   Procedure: TOTAL KNEE ARTHROPLASTY;  Surgeon: Lovell Sheehan, MD;  Location: [facility];  Service: Orthopedics;  Laterality: Left;  . WRIST ARTHROSCOPY Left 1970s    Medical History: Past Medical History:  Diagnosis Date  . Abnormal Pap smear of cervix    Pt states she had colposcopy   . Arthritis   . Chronic kidney disease   . Hx of unilateral nephrectomy 1979   Left Nephrectomy  . Hypertension   . PMB (postmenopausal bleeding)   . Restless leg syndrome   . Vitamin D deficiency     Family History: Family History  Problem Relation Age of Onset  . Ovarian cancer Mother   . Hypertension Father   . Diabetes Father   . Cancer Father   . Breast cancer Sister   . Diabetes Sister       Review of Systems  Constitutional: Positive for activity change. Negative for chills, fatigue and unexpected weight change.  HENT: Negative for congestion, postnasal drip, rhinorrhea, sneezing and sore throat.   Eyes: Negative.  Negative for redness.  Respiratory: Negative for cough, chest tightness and shortness of breath.   Cardiovascular: Negative for chest pain and palpitations.       Blood pressure has been elevated recently   Gastrointestinal: Negative for abdominal pain, constipation, diarrhea, nausea and vomiting.  Genitourinary: Negative for dysuria and frequency.  Musculoskeletal: Positive for arthralgias and joint swelling. Negative for back pain and neck pain.       Pain in the hands,  especially across the fingers of left hand. Hard to open jars and hold things in the right hand.   Skin: Negative for rash.  Allergic/Immunologic: Negative for environmental allergies, food allergies and immunocompromised state.  Neurological: Positive for headaches. Negative for tremors and numbness.  Hematological: Negative for adenopathy. Does not bruise/bleed easily.  Psychiatric/Behavioral: Negative for behavioral problems (Depression), sleep disturbance and suicidal ideas. The patient is not nervous/anxious.     Today's Vitals   10/06/17 1358  BP: (!) 146/90  Pulse: 70  Resp: 16  SpO2: 98%  Weight: 241 lb 12.8 oz (109.7 kg)  Height: 5\' 5"  (1.651 m)     Physical Exam  Constitutional: She is oriented to person, place, and time. She appears well-developed and well-nourished. No distress.  HENT:  Head: Normocephalic and atraumatic.  Mouth/Throat: Oropharynx is clear and moist. No  oropharyngeal exudate.  Eyes: Pupils are equal, round, and reactive to light. EOM are normal.  Neck: Normal range of motion. Neck supple. No JVD present. Carotid bruit is not present. No tracheal deviation present. No thyromegaly present.  Cardiovascular: Normal rate, regular rhythm, normal heart sounds and intact distal pulses. Exam reveals no gallop and no friction rub.  No murmur heard. Pulmonary/Chest: Effort normal and breath sounds normal. No respiratory distress. She has no wheezes. She has no rales. She exhibits no tenderness. Right breast exhibits no inverted nipple, no mass, no nipple discharge, no skin change and no tenderness. Left breast exhibits no inverted nipple, no mass, no nipple discharge, no skin change and no tenderness. Breasts are symmetrical.  Abdominal: Soft. Bowel sounds are normal. There is no tenderness.  Musculoskeletal: Normal range of motion.  Lymphadenopathy:    She has no cervical adenopathy.  Neurological: She is alert and oriented to person, place, and time. No cranial  nerve deficit.  Skin: Skin is warm and dry. She is not diaphoretic.  Psychiatric: She has a normal mood and affect. Her behavior is normal. Judgment and thought content normal.  Nursing note and vitals reviewed.  Assessment/Plan: 1. Encounter for general adult medical examination with abnormal findings Annual health maintenance exam performed today. - CBC with Differential/Platelet - Comprehensive metabolic panel  2. Essential hypertension Stable. Continue bp medication as prescribed - CBC with Differential/Platelet - Comprehensive metabolic panel - T4, free - TSH - carvedilol (COREG) 3.125 MG tablet; Take 1 tablet (3.125 mg total) by mouth 2 (two) times daily with a meal.  Dispense: 60 tablet; Refill: 3  3. Mixed hyperlipidemia - Lipid panel  4. Low back pain at multiple sites Recommend NSAIDs as needed and as indicated. Recommend use of low heat to tender areas to relieve pain and sore muscles.   5. Dysuria - Urinalysis, Routine w reflex microscopic  6. Screening for breast cancer - MM DIAG BREAST TOMO BILATERAL; Future  7. Vitamin D deficiency - Vitamin D 1,25 dihydroxy  General Counseling: Oaklyn verbalizes understanding of the findings of todays visit and agrees with plan of treatment. I have discussed any further diagnostic evaluation that may be needed or ordered today. We also reviewed her medications today. she has been encouraged to call the office with any questions or concerns that should arise related to todays visit.   Hypertension Counseling:   The following hypertensive lifestyle modification were recommended and discussed:  1. Limiting alcohol intake to less than 1 oz/day of ethanol:(24 oz of beer or 8 oz of wine or 2 oz of 100-proof whiskey). 2. Take baby ASA 81 mg daily. 3. Importance of regular aerobic exercise and losing weight. 4. Reduce dietary saturated fat and cholesterol intake for overall cardiovascular health. 5. Maintaining adequate dietary  potassium, calcium, and magnesium intake. 6. Regular monitoring of the blood pressure. 7. Reduce sodium intake to less than 100 mmol/day (less than 2.3 gm of sodium or less than 6 gm of sodium choride)   This patient was seen by Leretha Pol, FNP- C in Collaboration with Dr Lavera Guise as a part of collaborative care agreement    Orders Placed This Encounter  Procedures  . Microscopic Examination  . MM DIAG BREAST TOMO BILATERAL  . Urinalysis, Routine w reflex microscopic  . CBC with Differential/Platelet  . Comprehensive metabolic panel  . T4, free  . TSH  . Lipid panel  . Vitamin D 1,25 dihydroxy    Meds ordered this encounter  Medications  . carvedilol (COREG) 3.125 MG tablet    Sig: Take 1 tablet (3.125 mg total) by mouth 2 (two) times daily with a meal.    Dispense:  60 tablet    Refill:  3    Order Specific Question:   Supervising Provider    Answer:   Lavera Guise [place]  . DISCONTD: traMADol (ULTRAM) 50 MG tablet    Sig: Take 1 tablet (50 mg total) by mouth every 12 (twelve) hours as needed.    Dispense:  60 tablet    Refill:  2    Order Specific Question:   Supervising Provider    Answer:   Lavera Guise [[id number]]    Time spent: [place], MD  Internal Medicine

## 2017-10-07 LAB — URINALYSIS, ROUTINE W REFLEX MICROSCOPIC
Bilirubin, UA: NEGATIVE
Glucose, UA: NEGATIVE
Ketones, UA: NEGATIVE
Leukocytes, UA: NEGATIVE
Nitrite, UA: NEGATIVE
RBC, UA: NEGATIVE
Specific Gravity, UA: 1.014 (ref 1.005–1.030)
Urobilinogen, Ur: 0.2 mg/dL (ref 0.2–1.0)
pH, UA: 7 (ref 5.0–7.5)

## 2017-10-07 LAB — MICROSCOPIC EXAMINATION
Casts: NONE SEEN /lpf
Epithelial Cells (non renal): >10 /hpf — AB (ref 0–10)

## 2017-10-08 ENCOUNTER — Ambulatory Visit: Payer: Medicare Other | Attending: Orthopedic Surgery

## 2017-10-08 DIAGNOSIS — R262 Difficulty in walking, not elsewhere classified: Secondary | ICD-10-CM | POA: Insufficient documentation

## 2017-10-08 DIAGNOSIS — M25551 Pain in right hip: Secondary | ICD-10-CM | POA: Diagnosis present

## 2017-10-08 DIAGNOSIS — M6281 Muscle weakness (generalized): Secondary | ICD-10-CM | POA: Insufficient documentation

## 2017-10-08 DIAGNOSIS — M25562 Pain in left knee: Secondary | ICD-10-CM | POA: Insufficient documentation

## 2017-10-08 NOTE — Therapy (Signed)
[facility] [address], [place], [address] Phone: [phone]   Fax:  [phone]  Physical Therapy Treatment  Patient Details  Name: Whitney [name] MRN: [id number] Date of Birth: 1951/01/05 Referring Provider: Kurtis Bushman MD   Encounter Date: 10/08/2017  PT End of Session - 10/08/17 1519    Visit Number  10    Number of Visits  12    Date for PT Re-Evaluation  10/12/17    PT Start Time  1430    PT Stop Time  1515    PT Time Calculation (min)  45 min    Activity Tolerance  Patient tolerated treatment well    Behavior During Therapy  [facility] for tasks assessed/performed       Past Medical History:  Diagnosis Date  . Abnormal Pap smear of cervix    Pt states she had colposcopy   . Arthritis   . Chronic kidney disease   . Hx of unilateral nephrectomy 1979   Left Nephrectomy  . Hypertension   . PMB (postmenopausal bleeding)   . Restless leg syndrome   . Vitamin D deficiency     Past Surgical History:  Procedure Laterality Date  . HYSTEROSCOPY W/D&C N/A 02/16/2017   Procedure: DILATATION AND CURETTAGE /HYSTEROSCOPY;  Surgeon: Defrancesco, Alanda Slim, MD;  Location: [facility];  Service: Gynecology;  Laterality: N/A;  . KIDNEY SURGERY Left 1979   left kidney removed  . TOTAL KNEE ARTHROPLASTY Left 07/06/2017   Procedure: TOTAL KNEE ARTHROPLASTY;  Surgeon: Lovell Sheehan, MD;  Location: [facility];  Service: Orthopedics;  Laterality: Left;  . WRIST ARTHROSCOPY Left 1970s    There were no vitals filed for this visit.  Subjective Assessment - 10/08/17 1503    Subjective  Patient reports she went to her MD who said to only perform exercise if BP was under 150/56mmHg. Patient reports she would really like to exercise today and has been performing exercises at home.     Pertinent History  s/p TKA 07/06/2017; right knee arthritis with weakness as well with reports of right and left leg "giving way" intermittently     Limitations  Sitting;Walking;Standing;House hold activities;Other (comment)    How long can you sit comfortably?  forever    How long can you stand comfortably?  5 minutes    How long can you walk comfortably?  "not at all"    Diagnostic tests  imaging - see scanned docs    Patient Stated Goals  improve strength and motion left knee, walk without AD    Currently in Pain?  No/denies    Pain Onset  More than a month ago        TREATMENT: Therapeutic Exercise Leg Press in sitting - x 20 , x20  85# Ambulation with focus on improving speed and step length - x 364ft  Obstacle course walking over and around obstacles - x 259ft  Monster walks with therapist assistance - 28ft x 2 Hip abduction in standing - x 10  Patient demonstrates increased fatigue at the end of the session   PT Education - 10/08/17 1519    Education provided  Yes    Education Details  form/technique with exercise     Person(s) Educated  Patient    Methods  Explanation;Demonstration    Comprehension  Verbalized understanding;Returned demonstration          PT Long Term Goals - 09/14/17 1439      PT  LONG TERM GOAL #1   Title  Pt will improve 10 MWS to .8 M/S to improve functional activity tolerance/gait    Baseline  15 seconds (.67 m/s and using rolling walker); .8 m/s with FWW 09/14/17    Status  On-going      PT LONG TERM GOAL #2   Title  Pt will improve 10 MWS to 1.0 M/S to improve functional activity tolerance/gait    Baseline  15 seconds (.39m/s); 09/14/17: .82 m/s without AD    Status  On-going      PT LONG TERM GOAL #3   Title  Patient will be able to ambulate independently with least restrictive AD safely on level surfaces, uneven surfaces and curbs to improve functional tasks at home and community    Baseline  rolling walker, limited function at home and community; 09/14/17 requires use of FWW for long distances    Status  On-going      PT LONG TERM GOAL #4   Title  LEFS improved to 35/80  demonstrating improved self perceived disabiltiy with daily tasks     Baseline  LEFS 26/80; 09/14/17: defer until next visit    Status  New      PT LONG TERM GOAL #5   Title  LEFS improved to 45/80 or better demonstrating improved self perceived disabiltiy with daily tasks     Baseline  LEFS 26/80; 09/14/17: defer until next visit    Status  New      Additional Long Term Goals   Additional Long Term Goals  Yes      PT LONG TERM GOAL #6   Title  Patient will be independent with home program for ROM, strengthening exercises in order to transition to self managment once discharged from physical therapy    Baseline  requires guidance, verbal cues to perform all exercises; Requires cueing to perform walking with postural sway    Time  6    Period  Weeks    Status  On-going      PT LONG TERM GOAL #7   Title  Patient will improve her 8minWT to >1061ft with least restrictive AD to improve ability to ambulate in the community without need for rest breaks.    Baseline  750 ft amb    Time  6    Period  Weeks    Status  New            Plan - 10/08/17 1525    Clinical Impression Statement  Patient's BP was under 150/90 thus started to perform exercise. Patient demosntrates increased fatigue during the session requiring frequent breaks throughout the session indicating decreased muscular endurance and coordination. Patient demonstrates improvement with lateral side bending and patient will benefit from further skilled therapy to return to prior level of function.     Clinical Impairments Affecting Rehab Potential  (+) motivated, acute condition (-) arthritis right knee with pain    PT Frequency  2x / week    PT Duration  6 weeks    PT Treatment/Interventions  Electrical Stimulation;Cryotherapy;Moist Heat;Gait training;Stair training;Therapeutic activities;Therapeutic exercise;Balance training;Patient/family education;Neuromuscular re-education;Manual techniques    PT Next Visit Plan  exercise  progression for ROM, strength    PT Home Exercise Plan  see patient education    Consulted and Agree with Plan of Care  Patient       Patient will benefit from skilled therapeutic intervention in order to improve the following deficits and impairments:  Pain, Decreased activity tolerance,  Decreased endurance, Decreased range of motion, Decreased strength, Impaired perceived functional ability, Difficulty walking  Visit Diagnosis: Acute pain of left knee  Pain in right hip  Difficulty in walking, not elsewhere classified     Problem List Patient Active Problem List   Diagnosis Date Noted  . Total knee replacement status, left 07/06/2017  . Chest pain 06/29/2017  . Postop check 02/25/2017  . Obesity (BMI 35.0-39.9 without comorbidity) 07/15/2016  . Endometrial polyp 07/15/2016  . Class 3 obesity due to excess calories with body mass index (BMI) of 50.0 to 59.9 in adult 07/15/2016  . Family history of breast cancer in first degree relative 07/15/2016  . Family history of ovarian cancer 07/15/2016    Blythe Stanford, PT DPT 10/08/2017, 3:39 PM  [facility] [address] [place], [address] Phone: [phone]   Fax:  [phone]  Name: Whitney [name] MRN: [id number] Date of Birth: 1951/03/13

## 2017-10-12 ENCOUNTER — Ambulatory Visit: Payer: Medicare Other

## 2017-10-12 DIAGNOSIS — R262 Difficulty in walking, not elsewhere classified: Secondary | ICD-10-CM | POA: Diagnosis not present

## 2017-10-12 DIAGNOSIS — M25551 Pain in right hip: Secondary | ICD-10-CM

## 2017-10-12 DIAGNOSIS — M6281 Muscle weakness (generalized): Secondary | ICD-10-CM | POA: Diagnosis not present

## 2017-10-12 DIAGNOSIS — G5601 Carpal tunnel syndrome, right upper limb: Secondary | ICD-10-CM | POA: Diagnosis not present

## 2017-10-12 DIAGNOSIS — M25562 Pain in left knee: Secondary | ICD-10-CM | POA: Diagnosis not present

## 2017-10-12 DIAGNOSIS — G5600 Carpal tunnel syndrome, unspecified upper limb: Secondary | ICD-10-CM | POA: Insufficient documentation

## 2017-10-12 NOTE — Therapy (Signed)
[facility] [address] [place], [address] Phone: [phone]   Fax:  [phone]  Physical Therapy Treatment  Patient Details  Name: Whitney [name] MRN: [id number] Date of Birth: [birth year] Referring Provider: Kurtis Bushman MD   Encounter Date: 10/12/2017  PT End of Session - 10/12/17 1444    Visit Number  11    Number of Visits  12    Date for PT Re-Evaluation  10/12/17    PT Start Time  1430    PT Stop Time  1515    PT Time Calculation (min)  45 min    Activity Tolerance  Patient tolerated treatment well    Behavior During Therapy  [facility] for tasks assessed/performed       Past Medical History:  Diagnosis Date  . Abnormal Pap smear of cervix    Pt states she had colposcopy   . Arthritis   . Chronic kidney disease   . Hx of unilateral nephrectomy 1979   Left Nephrectomy  . Hypertension   . PMB (postmenopausal bleeding)   . Restless leg syndrome   . Vitamin D deficiency     Past Surgical History:  Procedure Laterality Date  . HYSTEROSCOPY W/D&C N/A 02/16/2017   Procedure: DILATATION AND CURETTAGE /HYSTEROSCOPY;  Surgeon: Defrancesco, Alanda Slim, MD;  Location: [facility] ORS;  Service: Gynecology;  Laterality: N/A;  . KIDNEY SURGERY Left 1979   left kidney removed  . TOTAL KNEE ARTHROPLASTY Left 07/06/2017   Procedure: TOTAL KNEE ARTHROPLASTY;  Surgeon: Lovell Sheehan, MD;  Location: [facility] ORS;  Service: Orthopedics;  Laterality: Left;  . WRIST ARTHROSCOPY Left 1970s    There were no vitals filed for this visit.  Subjective Assessment - 10/12/17 1442    Subjective  Patient reports she had hand and wrist pain over the past week. Patient reports she recieved a shot in the wrist and the hand and reports decrease ever since.     Pertinent History  s/p TKA 07/06/2017; right knee arthritis with weakness as well with reports of right and left leg "giving way" intermittently    Limitations   Sitting;Walking;Standing;House hold activities;Other (comment)    How long can you sit comfortably?  forever    How long can you stand comfortably?  5 minutes    How long can you walk comfortably?  "not at all"    Diagnostic tests  imaging - see scanned docs    Patient Stated Goals  improve strength and motion left knee, walk without AD    Currently in Pain?  No/denies    Pain Onset  More than a month ago         TREATMENT: Therapeutic Exercise Leg Press in sitting - 2x20  105# with fast out, slow back in Hip machine hip abduction - 2x 12 40# Ambulation with focus on improving speed and step length - x 180ft   Patient demonstrates increased fatigue at the end of the session     PT Education - 10/12/17 1443    Education provided  Yes    Education Details  form/technique with exercise    Person(s) Educated  Patient    Methods  Explanation;Demonstration    Comprehension  Verbalized understanding;Returned demonstration          PT Long Term Goals - 09/14/17 1439      PT LONG TERM GOAL #1   Title  Pt will improve 10 MWS to .8 M/S to improve functional  activity tolerance/gait    Baseline  15 seconds (.67 m/s and using rolling walker); .8 m/s with FWW 09/14/17    Status  On-going      PT LONG TERM GOAL #2   Title  Pt will improve 10 MWS to 1.0 M/S to improve functional activity tolerance/gait    Baseline  15 seconds (.72m/s); 09/14/17: .82 m/s without AD    Status  On-going      PT LONG TERM GOAL #3   Title  Patient will be able to ambulate independently with least restrictive AD safely on level surfaces, uneven surfaces and curbs to improve functional tasks at home and community    Baseline  rolling walker, limited function at home and community; 09/14/17 requires use of FWW for long distances    Status  On-going      PT LONG TERM GOAL #4   Title  LEFS improved to 35/80 demonstrating improved self perceived disabiltiy with daily tasks     Baseline  LEFS 26/80; 09/14/17:  defer until next visit    Status  New      PT LONG TERM GOAL #5   Title  LEFS improved to 45/80 or better demonstrating improved self perceived disabiltiy with daily tasks     Baseline  LEFS 26/80; 09/14/17: defer until next visit    Status  New      Additional Long Term Goals   Additional Long Term Goals  Yes      PT LONG TERM GOAL #6   Title  Patient will be independent with home program for ROM, strengthening exercises in order to transition to self managment once discharged from physical therapy    Baseline  requires guidance, verbal cues to perform all exercises; Requires cueing to perform walking with postural sway    Time  6    Period  Weeks    Status  On-going      PT LONG TERM GOAL #7   Title  Patient will improve her 6minWT to >1066ft with least restrictive AD to improve ability to ambulate in the community without need for rest breaks.    Baseline  750 ft amb    Time  6    Period  Weeks    Status  New            Plan - 10/12/17 1445    Clinical Impression Statement  Patient BP was 139/3mmHg thus proceeded with therapy. Patient deonstrates decreased strength with her LE B most notably with performing hip abduction and other hip exercises. Patient continues to demonstrate increased lateral trunk sway with walking but is overall improving. Patient will benefit from further skilled therapy to return to prior level of function.     Clinical Impairments Affecting Rehab Potential  (+) motivated, acute condition (-) arthritis right knee with pain    PT Frequency  2x / week    PT Duration  6 weeks    PT Treatment/Interventions  Electrical Stimulation;Cryotherapy;Moist Heat;Gait training;Stair training;Therapeutic activities;Therapeutic exercise;Balance training;Patient/family education;Neuromuscular re-education;Manual techniques    PT Next Visit Plan  exercise progression for ROM, strength    PT Home Exercise Plan  see patient education    Consulted and Agree with Plan of  Care  Patient       Patient will benefit from skilled therapeutic intervention in order to improve the following deficits and impairments:  Pain, Decreased activity tolerance, Decreased endurance, Decreased range of motion, Decreased strength, Impaired perceived functional ability, Difficulty walking  Visit  Diagnosis: Acute pain of left knee  Pain in right hip  Difficulty in walking, not elsewhere classified  Muscle weakness (generalized)     Problem List Patient Active Problem List   Diagnosis Date Noted  . Total knee replacement status, left 07/06/2017  . Chest pain 06/29/2017  . Postop check 02/25/2017  . Obesity (BMI 35.0-39.9 without comorbidity) 07/15/2016  . Endometrial polyp 07/15/2016  . Class 3 obesity due to excess calories with body mass index (BMI) of 50.0 to 59.9 in adult 07/15/2016  . Family history of breast cancer in first degree relative 07/15/2016  . Family history of ovarian cancer 07/15/2016    Blythe Stanford, PT DPT 10/12/2017, 3:42 PM  [facility] [address], [place], [address] Phone: [phone]   Fax:  [phone]  Name: Whitney [name] MRN: [id number] Date of Birth: 1951-01-09

## 2017-10-14 ENCOUNTER — Ambulatory Visit: Payer: Medicare Other

## 2017-10-14 DIAGNOSIS — M25562 Pain in left knee: Secondary | ICD-10-CM | POA: Diagnosis not present

## 2017-10-14 DIAGNOSIS — M6281 Muscle weakness (generalized): Secondary | ICD-10-CM | POA: Diagnosis not present

## 2017-10-14 DIAGNOSIS — R262 Difficulty in walking, not elsewhere classified: Secondary | ICD-10-CM | POA: Diagnosis not present

## 2017-10-14 DIAGNOSIS — M25551 Pain in right hip: Secondary | ICD-10-CM | POA: Diagnosis not present

## 2017-10-14 NOTE — Therapy (Signed)
[facility] [address] [place], [address] Phone: [phone]   Fax:  [phone]  Physical Therapy Treatment  Patient Details  Name: Whitney [name] MRN: [id number] Date of Birth: 1951-03-20 Referring Provider: Kurtis Bushman MD   Encounter Date: 10/14/2017  PT End of Session - 10/14/17 1457    Visit Number  12    Number of Visits  20    Date for PT Re-Evaluation  11/11/17    PT Start Time  1430    PT Stop Time  1515    PT Time Calculation (min)  45 min    Activity Tolerance  Patient tolerated treatment well    Behavior During Therapy  [facility] for tasks assessed/performed       Past Medical History:  Diagnosis Date  . Abnormal Pap smear of cervix    Pt states she had colposcopy   . Arthritis   . Chronic kidney disease   . Hx of unilateral nephrectomy 1979   Left Nephrectomy  . Hypertension   . PMB (postmenopausal bleeding)   . Restless leg syndrome   . Vitamin D deficiency     Past Surgical History:  Procedure Laterality Date  . HYSTEROSCOPY W/D&C N/A 02/16/2017   Procedure: DILATATION AND CURETTAGE /HYSTEROSCOPY;  Surgeon: Defrancesco, Alanda Slim, MD;  Location: [facility] ORS;  Service: Gynecology;  Laterality: N/A;  . KIDNEY SURGERY Left 1979   left kidney removed  . TOTAL KNEE ARTHROPLASTY Left 07/06/2017   Procedure: TOTAL KNEE ARTHROPLASTY;  Surgeon: Lovell Sheehan, MD;  Location: [facility] ORS;  Service: Orthopedics;  Laterality: Left;  . WRIST ARTHROSCOPY Left 1970s    There were no vitals filed for this visit.  Subjective Assessment - 10/14/17 1452    Subjective  Patient reports her leg is feeling a bit weak but is happy she was able to walk around [place] yesterday with the cart. Patient states she feels she is improving.     Pertinent History  s/p TKA 07/06/2017; right knee arthritis with weakness as well with reports of right and left leg "giving way" intermittently    Limitations   Sitting;Walking;Standing;House hold activities;Other (comment)    How long can you sit comfortably?  forever    How long can you stand comfortably?  5 minutes    How long can you walk comfortably?  "not at all"    Diagnostic tests  imaging - see scanned docs    Patient Stated Goals  improve strength and motion left knee, walk without AD    Currently in Pain?  No/denies    Pain Onset  More than a month ago       TREATMENT: Therapeutic Exercise: Ambulation -- 2 x 2ft with focus on speed Backwards walking - 5 x 61ft with therapist support and improvement on backwards stepping Leg Press - 2 x 15 105# with performing fast outward motion; slow inward Ambulation with focus on improving step length and speed - 6 min for 1016ft    PT Education - 10/14/17 1453    Education provided  Yes    Education Details  form/technique with exercise    Person(s) Educated  Patient    Methods  Explanation;Demonstration    Comprehension  Verbalized understanding;Returned demonstration          PT Long Term Goals - 10/14/17 1526      PT LONG TERM GOAL #1   Title  Pt will improve 10 MWS to .8 M/S  to improve functional activity tolerance/gait    Baseline  15 seconds (.67 m/s and using rolling walker); .8 m/s with FWW 09/14/17; 10/14/17: .95    Status  Achieved      PT LONG TERM GOAL #2   Title  Pt will improve 10 MWS to 1.0 M/S to improve functional activity tolerance/gait    Baseline  15 seconds (.58m/s); 09/14/17: .82 m/s without AD; 10/14/17: .95    Status  On-going      PT LONG TERM GOAL #3   Title  Patient will be able to ambulate independently with least restrictive AD safely on level surfaces, uneven surfaces and curbs to improve functional tasks at home and community    Baseline  rolling walker, limited function at home and community; 09/14/17 requires use of FWW for long distances; 10/14/17: unable to amb over uneven surfaces    Status  On-going      PT LONG TERM GOAL #4   Title  LEFS  improved to 35/80 demonstrating improved self perceived disabiltiy with daily tasks     Baseline  LEFS 26/80; 09/14/17: defer until next visit; 10/14/2017: 50/80    Status  Achieved      PT LONG TERM GOAL #5   Title  LEFS improved to 45/80 or better demonstrating improved self perceived disabiltiy with daily tasks     Baseline  LEFS 26/80; 09/14/17: defer until next visit;10/14/17: .95    Status  Achieved      PT LONG TERM GOAL #6   Title  Patient will be independent with home program for ROM, strengthening exercises in order to transition to self managment once discharged from physical therapy    Baseline  requires guidance, verbal cues to perform all exercises; Requires cueing to perform walking with postural sway; 10/14/17: increased postural sway with EXercise    Time  6    Period  Weeks    Status  On-going      PT LONG TERM GOAL #7   Title  Patient will improve her 11minWT to >1083ft with least restrictive AD to improve ability to ambulate in the community without need for rest breaks.    Baseline  750 ft amb; 10/14/17: 1071ft with FWW    Time  6    Period  Weeks    Status  On-going            Plan - 10/14/17 1521    Clinical Impression Statement  Patient's BP is a 140/44mmHg and continued with session today. Patient is making long term progress towards goals with an improvement in 70minWT and 84mWT indicating functional carryover between sessions. Although patient is improving, she continues to have a positive trendlenberg and patient will benefit from further skilled therapy.     Clinical Impairments Affecting Rehab Potential  (+) motivated, acute condition (-) arthritis right knee with pain    PT Frequency  2x / week    PT Duration  6 weeks    PT Treatment/Interventions  Electrical Stimulation;Cryotherapy;Moist Heat;Gait training;Stair training;Therapeutic activities;Therapeutic exercise;Balance training;Patient/family education;Neuromuscular re-education;Manual techniques    PT  Next Visit Plan  exercise progression for ROM, strength    PT Home Exercise Plan  see patient education    Consulted and Agree with Plan of Care  Patient       Patient will benefit from skilled therapeutic intervention in order to improve the following deficits and impairments:  Pain, Decreased activity tolerance, Decreased endurance, Decreased range of motion, Decreased strength, Impaired perceived functional  ability, Difficulty walking  Visit Diagnosis: Acute pain of left knee  Pain in right hip  Difficulty in walking, not elsewhere classified     Problem List Patient Active Problem List   Diagnosis Date Noted  . Total knee replacement status, left 07/06/2017  . Chest pain 06/29/2017  . Postop check 02/25/2017  . Obesity (BMI 35.0-39.9 without comorbidity) 07/15/2016  . Endometrial polyp 07/15/2016  . Class 3 obesity due to excess calories with body mass index (BMI) of 50.0 to 59.9 in adult 07/15/2016  . Family history of breast cancer in first degree relative 07/15/2016  . Family history of ovarian cancer 07/15/2016    Blythe Stanford, PT DPT 10/14/2017, 3:33 PM  [facility] [address] [place], [address] Phone: [phone]   Fax:  [phone]  Name: Whitney [name] MRN: [id number] Date of Birth: [birth year]

## 2017-10-15 DIAGNOSIS — G5601 Carpal tunnel syndrome, right upper limb: Secondary | ICD-10-CM | POA: Diagnosis not present

## 2017-10-19 ENCOUNTER — Other Ambulatory Visit: Payer: Self-pay

## 2017-10-19 ENCOUNTER — Telehealth: Payer: Self-pay

## 2017-10-19 ENCOUNTER — Ambulatory Visit: Payer: Medicare Other

## 2017-10-19 ENCOUNTER — Emergency Department: Payer: Medicare Other

## 2017-10-19 ENCOUNTER — Observation Stay
Admission: EM | Admit: 2017-10-19 | Discharge: 2017-10-22 | Disposition: A | Discharge status: Home / Self Care | Payer: Medicare Other | Attending: Internal Medicine | Admitting: Internal Medicine

## 2017-10-19 DIAGNOSIS — R008 Other abnormalities of heart beat: Secondary | ICD-10-CM | POA: Insufficient documentation

## 2017-10-19 DIAGNOSIS — G629 Polyneuropathy, unspecified: Secondary | ICD-10-CM | POA: Diagnosis not present

## 2017-10-19 DIAGNOSIS — M25551 Pain in right hip: Secondary | ICD-10-CM

## 2017-10-19 DIAGNOSIS — N189 Chronic kidney disease, unspecified: Secondary | ICD-10-CM | POA: Diagnosis not present

## 2017-10-19 DIAGNOSIS — Z888 Allergy status to other drugs, medicaments and biological substances status: Secondary | ICD-10-CM | POA: Insufficient documentation

## 2017-10-19 DIAGNOSIS — K5730 Diverticulosis of large intestine without perforation or abscess without bleeding: Secondary | ICD-10-CM | POA: Insufficient documentation

## 2017-10-19 DIAGNOSIS — Z905 Acquired absence of kidney: Secondary | ICD-10-CM | POA: Insufficient documentation

## 2017-10-19 DIAGNOSIS — I2510 Atherosclerotic heart disease of native coronary artery without angina pectoris: Secondary | ICD-10-CM | POA: Diagnosis not present

## 2017-10-19 DIAGNOSIS — G2581 Restless legs syndrome: Secondary | ICD-10-CM | POA: Insufficient documentation

## 2017-10-19 DIAGNOSIS — I129 Hypertensive chronic kidney disease with stage 1 through stage 4 chronic kidney disease, or unspecified chronic kidney disease: Secondary | ICD-10-CM | POA: Insufficient documentation

## 2017-10-19 DIAGNOSIS — Z96652 Presence of left artificial knee joint: Secondary | ICD-10-CM | POA: Insufficient documentation

## 2017-10-19 DIAGNOSIS — R079 Chest pain, unspecified: Secondary | ICD-10-CM | POA: Diagnosis not present

## 2017-10-19 DIAGNOSIS — R001 Bradycardia, unspecified: Secondary | ICD-10-CM | POA: Diagnosis present

## 2017-10-19 DIAGNOSIS — Z7982 Long term (current) use of aspirin: Secondary | ICD-10-CM | POA: Insufficient documentation

## 2017-10-19 DIAGNOSIS — Z79899 Other long term (current) drug therapy: Secondary | ICD-10-CM | POA: Diagnosis not present

## 2017-10-19 DIAGNOSIS — R262 Difficulty in walking, not elsewhere classified: Secondary | ICD-10-CM

## 2017-10-19 DIAGNOSIS — M25562 Pain in left knee: Secondary | ICD-10-CM

## 2017-10-19 DIAGNOSIS — R55 Syncope and collapse: Secondary | ICD-10-CM

## 2017-10-19 DIAGNOSIS — I10 Essential (primary) hypertension: Secondary | ICD-10-CM | POA: Diagnosis not present

## 2017-10-19 LAB — BASIC METABOLIC PANEL
Anion gap: 9 (ref 5–15)
BUN: 19 mg/dL (ref 6–20)
CO2: 24 mmol/L (ref 22–32)
Calcium: 8.3 mg/dL — ABNORMAL LOW (ref 8.9–10.3)
Chloride: 102 mmol/L (ref 101–111)
Creatinine, Ser: 1.12 mg/dL — ABNORMAL HIGH (ref 0.44–1.00)
GFR calc Af Amer: 58 mL/min — ABNORMAL LOW (ref >60)
GFR calc non Af Amer: 50 mL/min — ABNORMAL LOW (ref >60)
Glucose, Bld: 87 mg/dL (ref 65–99)
Potassium: 4.6 mmol/L (ref 3.5–5.1)
Sodium: 135 mmol/L (ref 135–145)

## 2017-10-19 LAB — CBC
HCT: 41.4 % (ref 35.0–47.0)
Hemoglobin: 13.3 g/dL (ref 12.0–16.0)
MCH: 28.1 pg (ref 26.0–34.0)
MCHC: 32.1 g/dL (ref 32.0–36.0)
MCV: 87.7 fL (ref 80.0–100.0)
Platelets: 310 10*3/uL (ref 150–440)
RBC: 4.72 MIL/uL (ref 3.80–5.20)
RDW: 15.1 % — ABNORMAL HIGH (ref 11.5–14.5)
WBC: 6.3 10*3/uL (ref 3.6–11.0)

## 2017-10-19 LAB — TROPONIN I: Troponin I: <0.03 ng/mL (ref <0.03)

## 2017-10-19 MED ORDER — OXYCODONE HCL 5 MG PO TABS
5.0000 mg | ORAL_TABLET | ORAL | Status: DC | PRN
  Administered 2017-10-20: 10 mg via ORAL
  Filled 2017-10-19: qty 2

## 2017-10-19 MED ORDER — ONDANSETRON HCL 4 MG PO TABS: 4.0000 mg | ORAL_TABLET | Freq: Four times a day (QID) | ORAL | Status: DC | PRN

## 2017-10-19 MED ORDER — TRAZODONE HCL 50 MG PO TABS
25.0000 mg | ORAL_TABLET | Freq: Every evening | ORAL | Status: DC | PRN
  Administered 2017-10-19: 25 mg via ORAL
  Filled 2017-10-19: qty 1

## 2017-10-19 MED ORDER — GABAPENTIN 100 MG PO CAPS: 200.0000 mg | ORAL_CAPSULE | Freq: Two times a day (BID) | ORAL | Status: DC

## 2017-10-19 MED ORDER — BISACODYL 5 MG PO TBEC
5.0000 mg | DELAYED_RELEASE_TABLET | Freq: Every day | ORAL | Status: DC | PRN
  Filled 2017-10-19: qty 1

## 2017-10-19 MED ORDER — ROPINIROLE HCL 0.5 MG PO TABS: 0.5000 mg | ORAL_TABLET | Freq: Every day | ORAL | Status: DC

## 2017-10-19 MED ORDER — HYDROCODONE-ACETAMINOPHEN 5-325 MG PO TABS: 1.0000 | ORAL_TABLET | ORAL | Status: DC | PRN

## 2017-10-19 MED ORDER — SENNA 8.6 MG PO TABS
2.0000 | ORAL_TABLET | Freq: Two times a day (BID) | ORAL | Status: DC
  Filled 2017-10-19 (×2): qty 2

## 2017-10-19 MED ORDER — ENOXAPARIN SODIUM 40 MG/0.4ML ~~LOC~~ SOLN
40.0000 mg | SUBCUTANEOUS | Status: DC
  Administered 2017-10-19: 40 mg via SUBCUTANEOUS
  Filled 2017-10-19 (×2): qty 0.4

## 2017-10-19 MED ORDER — AMLODIPINE BESYLATE 10 MG PO TABS
10.0000 mg | ORAL_TABLET | Freq: Every day | ORAL | Status: DC
  Administered 2017-10-20: 10 mg via ORAL
  Filled 2017-10-19: qty 1

## 2017-10-19 MED ORDER — HYDRALAZINE HCL 10 MG PO TABS
10.0000 mg | ORAL_TABLET | Freq: Three times a day (TID) | ORAL | Status: DC
  Administered 2017-10-19 – 2017-10-22 (×8): 10 mg via ORAL
  Filled 2017-10-19 (×8): qty 1

## 2017-10-19 MED ORDER — DOCUSATE SODIUM 100 MG PO CAPS
100.0000 mg | ORAL_CAPSULE | Freq: Two times a day (BID) | ORAL | Status: DC
  Filled 2017-10-19 (×2): qty 1

## 2017-10-19 MED ORDER — ACETAMINOPHEN 325 MG PO TABS
650.0000 mg | ORAL_TABLET | Freq: Four times a day (QID) | ORAL | Status: DC | PRN
  Administered 2017-10-20: 650 mg via ORAL

## 2017-10-19 MED ORDER — ASPIRIN EC 325 MG PO TBEC
325.0000 mg | DELAYED_RELEASE_TABLET | Freq: Every day | ORAL | Status: DC
  Administered 2017-10-20 – 2017-10-22 (×3): 325 mg via ORAL
  Filled 2017-10-19 (×3): qty 1

## 2017-10-19 MED ORDER — VITAMIN D (ERGOCALCIFEROL) 1.25 MG (50000 UNIT) PO CAPS
50000.0000 [IU] | ORAL_CAPSULE | ORAL | Status: DC
Start: 2017-10-25 — End: 2017-10-22

## 2017-10-19 MED ORDER — ONDANSETRON HCL 4 MG/2ML IJ SOLN: 4.0000 mg | Freq: Four times a day (QID) | INTRAMUSCULAR | Status: DC | PRN

## 2017-10-19 MED ORDER — ACETAMINOPHEN 325 MG PO TABS
650.0000 mg | ORAL_TABLET | Freq: Four times a day (QID) | ORAL | Status: DC
  Administered 2017-10-19 – 2017-10-21 (×3): 650 mg via ORAL
  Filled 2017-10-19 (×5): qty 2

## 2017-10-19 MED ORDER — ACETAMINOPHEN 650 MG RE SUPP: 650.0000 mg | Freq: Four times a day (QID) | RECTAL | Status: DC | PRN

## 2017-10-19 NOTE — ED Notes (Signed)
This nurse attempted IV x1 without success - [facility] ED Medic attempted IV x2 without success - will place IV team consult

## 2017-10-19 NOTE — ED Provider Notes (Signed)
[facility] Emergency Department Provider Note  Time seen: 3:41 PM  I have reviewed the triage vital signs and the nursing notes.   HISTORY  Chief Complaint Chest Pain    HPI Whitney [name] is a 67 y.o. female with a past medical history of hypertension, CKD, recent knee replacement, presents to the emergency department for nausea and near syncope.  According to the patient she was at physical therapy when she began feeling lightheaded, nauseated and sweaty.  States mild chest discomfort which she describes as a "scratchy" feeling in the center of her chest going to her left arm.  Patient felt like she was going to pass out but did not.  EMS states upon their arrival patient was having very frequent PVCs as frequent as bigeminy.  Patient states over the past 1 year this is happened 3 times in which she has passed out on each occasion.  She states she will occasionally get palpitations at home but rarely to this degree.  She states over the past 1 year during 3 different occasions she became nauseated mildly diaphoretic and ultimately passed out with each occasion.  Today upon arrival patient was initially feeling fine with no complaints however approximately 5 minutes after arrival she began feeling nauseated at that point her heart rate was noted to be around 40 bpm.  States the chest discomfort worsens, shortly after the heart rate improved back to 80 bpm and her symptoms resolved.  Currently the patient denies any chest discomfort or nausea.  Denies any shortness of breath at any point.  Largely negative review of systems otherwise.   Past Medical History:  Diagnosis Date  . Abnormal Pap smear of cervix    Pt states she had colposcopy   . Arthritis   . Chronic kidney disease   . Hx of unilateral nephrectomy 1979   Left Nephrectomy  . Hypertension   . PMB (postmenopausal bleeding)   . Restless leg syndrome   . Vitamin D deficiency     Patient Active Problem List    Diagnosis Date Noted  . Total knee replacement status, left 07/06/2017  . Chest pain 06/29/2017  . Postop check 02/25/2017  . Obesity (BMI 35.0-39.9 without comorbidity) 07/15/2016  . Endometrial polyp 07/15/2016  . Class 3 obesity due to excess calories with body mass index (BMI) of 50.0 to 59.9 in adult 07/15/2016  . Family history of breast cancer in first degree relative 07/15/2016  . Family history of ovarian cancer 07/15/2016    Past Surgical History:  Procedure Laterality Date  . HYSTEROSCOPY W/D&C N/A 02/16/2017   Procedure: DILATATION AND CURETTAGE /HYSTEROSCOPY;  Surgeon: Defrancesco, Alanda Slim, MD;  Location: [facility] ORS;  Service: Gynecology;  Laterality: N/A;  . KIDNEY SURGERY Left 1979   left kidney removed  . TOTAL KNEE ARTHROPLASTY Left 07/06/2017   Procedure: TOTAL KNEE ARTHROPLASTY;  Surgeon: Lovell Sheehan, MD;  Location: [facility] ORS;  Service: Orthopedics;  Laterality: Left;  . WRIST ARTHROSCOPY Left 1970s    Prior to Admission medications   Medication Sig Start Date End Date Taking? Authorizing Provider  acetaminophen (TYLENOL) 325 MG tablet Take 650 mg by mouth 4 (four) times daily.    [provider]  amLODipine (NORVASC) 10 MG tablet Take 10 mg by mouth daily.     [provider]  aspirin EC 325 MG EC tablet Take 1 tablet (325 mg total) by mouth daily with breakfast. 07/09/17   Lovell Sheehan, MD  carvedilol ([place])  3.125 MG tablet Take 1 tablet (3.125 mg total) by mouth 2 (two) times daily with a meal. 10/06/17   Boscia, Greer Ee, NP  docusate sodium (COLACE) 100 MG capsule Take 1 capsule (100 mg total) by mouth 2 (two) times daily. 07/08/17   Lovell Sheehan, MD  ergocalciferol (VITAMIN D2) 50000 units capsule Take 50,000 Units by mouth every Sunday.     [provider]  famotidine (PEPCID) 20 MG tablet Take 20 mg by mouth 2 (two) times daily.     [provider]  gabapentin (NEURONTIN) 100 MG capsule Take 200 mg 2 (two) times  daily by mouth.  06/04/17   [provider]  hydrALAZINE (APRESOLINE) 10 MG tablet Take 1 tablet (10 mg total) by mouth 3 (three) times daily. Hold for low blood pressure systolic less than 161 mmHg, diastolic less than 70 mmHg 07/08/17   Lovell Sheehan, MD  lip balm (BLISTEX) OINT Apply liberal amount to cold sores as needed for pain/irritation. Ok to leave at bedside for pt to apply    [provider]  methocarbamol (ROBAXIN) 500 MG tablet Take 500 mg by mouth 3 (three) times daily.    [provider]  ondansetron (ZOFRAN) 4 MG tablet Take 4 mg by mouth every 6 (six) hours as needed for nausea.    [provider]  oxyCODONE (OXY IR/ROXICODONE) 5 MG immediate release tablet Take 5-10 mg by mouth every 4 (four) hours as needed for moderate pain or severe pain. 1 tab for moderate 2 tabs for severe pain    [provider]  rOPINIRole (REQUIP) 0.5 MG tablet Take 0.5 mg at bedtime by mouth. Takes 1 or 2 tablets as needed    [provider]  senna (SENOKOT) 8.6 MG TABS tablet Take 2 tablets by mouth 2 (two) times daily.    [provider]  traMADol (ULTRAM) 50 MG tablet Take 1 tablet (50 mg total) by mouth every 12 (twelve) hours as needed. 10/06/17   Ronnell Freshwater, NP    Allergies  Allergen Reactions  . Enalapril Swelling  . Lisinopril Swelling    Family History  Problem Relation Age of Onset  . Ovarian cancer Mother   . Hypertension Father   . Diabetes Father   . Cancer Father   . Breast cancer Sister   . Diabetes Sister     Social History Social History   Tobacco Use  . Smoking status: Never Smoker  . Smokeless tobacco: Never Used  Substance Use Topics  . Alcohol use: Yes    Comment: occasionally  . Drug use: Yes    Frequency: 3.0 times per week    Types: Marijuana, Cocaine    Comment: Pt states she uses for leg pain: marijuana; states no longer uses cocaine    Review of Systems Constitutional: Negative for loss  of consciousness.  Positive for lightheadedness. Eyes: Negative for visual complaints ENT: Negative for recent illness/congestion Cardiovascular: Mild chest discomfort which she describes as a scratchy sensation. Respiratory: Negative for shortness of breath. Gastrointestinal: Negative for abdominal pain.  Positive for nausea when symptoms occur. Genitourinary: Negative for urinary compaints Musculoskeletal: Negative for leg swelling.  Recent knee replacement 3 months ago. Skin: Negative for skin complaints  Neurological: Negative for headache All other ROS negative  ____________________________________________   PHYSICAL EXAM:  VITAL SIGNS: ED Triage Vitals  Enc Vitals Group     BP 10/19/17 1530 (!) 142/76     Pulse Rate 10/19/17 1530  78     Resp 10/19/17 1530 (!) 24     Temp 10/19/17 1530 98 F (36.7 C)     Temp Source 10/19/17 1530 Oral     SpO2 10/19/17 1529 99 %     Weight 10/19/17 1533 240 lb (108.9 kg)     Height 10/19/17 1533 5\' 5"  (1.651 m)     Head Circumference --      Peak Flow --      Pain Score 10/19/17 1530 6     Pain Loc --      Pain Edu? --      Excl. in [place]? --     Constitutional: Alert and oriented. Well appearing and in no distress. Eyes: Normal exam ENT   Head: Normocephalic and atraumatic.   Mouth/Throat: Mucous membranes are moist. Cardiovascular: Normal rate, regular rhythm. No murmur Respiratory: Normal respiratory effort without tachypnea nor retractions. Breath sounds are clear  Gastrointestinal: Soft and nontender. No distention.   Musculoskeletal: Nontender with normal range of motion in all extremities. No lower extremity edema. Neurologic:  Normal speech and language. No gross focal neurologic deficits are appreciated. Skin:  Skin is warm, dry and intact.  Psychiatric: Mood and affect are normal.  ____________________________________________    EKG  EKG reviewed and interpreted by myself shows normal sinus rhythm at 78 bpm  with a narrow QRS, normal axis, normal intervals, no concerning ST changes.  ____________________________________________    RADIOLOGY  X-ray negative  ____________________________________________   INITIAL IMPRESSION / ASSESSMENT AND PLAN / ED COURSE  Pertinent labs & imaging results that were available during my care of the patient were reviewed by me and considered in my medical decision making (see chart for details).  Patient presents to the emergency department with nausea, lightheadedness, near syncope.  Differential would include cardiogenic syncope, orthostatic syncope, vasovagal, palpitations, arrhythmia, ACS.  We will check labs including cardiac enzymes continue to closely monitor on a cardiac monitor.  During my initial evaluation patient dropped her heart rate to approximately 40 bpm for approximately 5 minutes.  I was able to auscultate and confirm with auscultation as well as mechanical palpation at the patient's heart rate was around 40 bpm.  Unfortunate the patient was not yet on the cardiac monitor.  We attempted to place the patient on the cardiac monitor just as the patient's heart rate returned back to normal.  We will continue to monitor in the emergency department on cardiac monitoring.  Patient's labs are largely within normal limits.  Troponin negative.  X-ray negative.  However given the patient's symptomatic bradycardia I believe she warrants admission for further workup and consideration of cardiology evaluation.  ____________________________________________   FINAL CLINICAL IMPRESSION(S) / ED DIAGNOSES  Symptomatic bradycardia Near syncope    Harvest Dark, MD 10/19/17 [phone]

## 2017-10-19 NOTE — ED Notes (Signed)
Lab called and stated they needed a recollect on blood - verbalized to lab that they would need to send someone back to draw the blood

## 2017-10-19 NOTE — Telephone Encounter (Signed)
PT CALLED SHE WAS AT PHYSICAL THERAPHY PLACE HER BP WAS LOW 105/55 AND PULSE WAS 40 AND THEY CHECK  AGAIN IN 126/79 AND PULSE IS 80 BUT PT WAS LIGHT HEADED AND THROWING UP ADVISED PT THERAPIST AND PT NEED TO GO TO ER  AND HEATHER AWARE

## 2017-10-19 NOTE — ED Triage Notes (Signed)
Pt arrived via ems for c/o chest pain and lightheadedness with vomiting while at rehab PT - pt having frequent PVC's and decreased heart rate - pt c/o left sided chest pain with diaphoresis - the pain radiates into pt back - Dr at bedside

## 2017-10-19 NOTE — Therapy (Signed)
[facility] [address] [place], [address] Phone: [phone]   Fax:  [phone]  Physical Therapy Treatment  Patient Details  Name: Whitney [name] MRN: [id number] Date of Birth: [birth year] Referring Provider: Kurtis Bushman MD   Encounter Date: 10/19/2017    Past Medical History:  Diagnosis Date  . Abnormal Pap smear of cervix    Pt states she had colposcopy   . Arthritis   . Chronic kidney disease   . Hx of unilateral nephrectomy 1979   Left Nephrectomy  . Hypertension   . PMB (postmenopausal bleeding)   . Restless leg syndrome   . Vitamin D deficiency     Past Surgical History:  Procedure Laterality Date  . HYSTEROSCOPY W/D&C N/A 02/16/2017   Procedure: DILATATION AND CURETTAGE /HYSTEROSCOPY;  Surgeon: Defrancesco, Alanda Slim, MD;  Location: [facility];  Service: Gynecology;  Laterality: N/A;  . KIDNEY SURGERY Left 1979   left kidney removed  . TOTAL KNEE ARTHROPLASTY Left 07/06/2017   Procedure: TOTAL KNEE ARTHROPLASTY;  Surgeon: Lovell Sheehan, MD;  Location: [facility] OR[facility];  Service: Orthopedics;  Laterality: Left;  . WRIST ARTHROSCOPY Left 1970s    There were no vitals filed for this visit.  Subjective Assessment - 10/19/17 1646    Subjective  Patient reports she feels alright today but has a slight headache from getting off of the bus coming into the treatment session. Patient reports she was able to walk a whole block with her SPC over the weekend and is excited to get started with therapy today.     Pertinent History  s/p TKA 07/06/2017; right knee arthritis with weakness as well with reports of right and left leg "giving way" intermittently    Limitations  Sitting;Walking;Standing;House hold activities;Other (comment)    How long can you sit comfortably?  forever    How long can you stand comfortably?  5 minutes    How long can you walk comfortably?  "not at all"    Diagnostic tests  imaging -  see scanned docs    Patient Stated Goals  improve strength and motion left knee, walk without AD    Currently in Pain?  No/denies    Pain Onset  More than a month ago        Upon entering the clinic patient reports a slight headache when getting out of the transport bus which she reports is light and not intense. Took BP & HR and patient's vitals result in a measurement of 105/76mmHg and 81bpm and continued with therapy. Educated patient we would monitor symptoms throughout session and not perform too strenuous of exercise. Started walking and was able to walk 300 ft when the patient starts to report increased symptoms of light headedness. PT immediately instructed patient to take a seat and retook vital signs. Patient's BP was 126/79 with a HR of 45 bpm and 96% SpO2. Patient reports she starts to feel dizzy and continues feeling light -headed; at this point patient denies any type of chest pain. After 7 min of sitting, she reports slight decrease in symptoms and light headedness and a HR of 80bpm. Patient states she needed to go the bathroom and went. PT stood outside to assist patient if needed. After 4 minutes, PT heard sounds of what sounded like emesis which was confirmed by the patient. Patient reports she had a worsening of symptoms and needed to sit down. PT guided patient back to the chair to sit.  Patient's HR was measured at 42bpm with O2 at 96% but was at a regular beat. Educated patient that I recommend going to ER.  Patient called her doctors office which confirmed the ER recommendation. At this point, patient looks pale and has her head down. PT got another therapist to watch patient and monitor vital signs while PT called 911. The watching therapist reports her HR decreased to 42 bpm and was now irregularly beating. PT watched patient until EMS arrived on the scene and gave report to the EMS including current medications patient is taken. EMS monitored vital signs taking an EKG, BP, SpO2 and  monitored symptoms. Patient reports she began to have chest pain with EMS. EMS recommends going to the ER based on symptoms. EMS assists patient onto stretcher and takes her to the hospital.       PT Long Term Goals - 10/14/17 1526      PT LONG TERM GOAL #1   Title  Pt will improve 10 MWS to .8 M/S to improve functional activity tolerance/gait    Baseline  15 seconds (.67 m/s and using rolling walker); .8 m/s with FWW 09/14/17; 10/14/17: .95    Status  Achieved      PT LONG TERM GOAL #2   Title  Pt will improve 10 MWS to 1.0 M/S to improve functional activity tolerance/gait    Baseline  15 seconds (.89m/s); 09/14/17: .82 m/s without AD; 10/14/17: .95    Status  On-going      PT LONG TERM GOAL #3   Title  Patient will be able to ambulate independently with least restrictive AD safely on level surfaces, uneven surfaces and curbs to improve functional tasks at home and community    Baseline  rolling walker, limited function at home and community; 09/14/17 requires use of FWW for long distances; 10/14/17: unable to amb over uneven surfaces    Status  On-going      PT LONG TERM GOAL #4   Title  LEFS improved to 35/80 demonstrating improved self perceived disabiltiy with daily tasks     Baseline  LEFS 26/80; 09/14/17: defer until next visit; 10/14/2017: 50/80    Status  Achieved      PT LONG TERM GOAL #5   Title  LEFS improved to 45/80 or better demonstrating improved self perceived disabiltiy with daily tasks     Baseline  LEFS 26/80; 09/14/17: defer until next visit;10/14/17: .95    Status  Achieved      PT LONG TERM GOAL #6   Title  Patient will be independent with home program for ROM, strengthening exercises in order to transition to self managment once discharged from physical therapy    Baseline  requires guidance, verbal cues to perform all exercises; Requires cueing to perform walking with postural sway; 10/14/17: increased postural sway with EXercise    Time  6    Period  Weeks     Status  On-going      PT LONG TERM GOAL #7   Title  Patient will improve her 73minWT to >1072ft with least restrictive AD to improve ability to ambulate in the community without need for rest breaks.    Baseline  750 ft amb; 10/14/17: 1092ft with FWW    Time  6    Period  Weeks    Status  On-going              Patient will benefit from skilled therapeutic intervention in order to improve the  following deficits and impairments:     Visit Diagnosis: Acute pain of left knee  Pain in right hip  Difficulty in walking, not elsewhere classified     Problem List Patient Active Problem List   Diagnosis Date Noted  . Total knee replacement status, left 07/06/2017  . Chest pain 06/29/2017  . Postop check 02/25/2017  . Obesity (BMI 35.0-39.9 without comorbidity) 07/15/2016  . Endometrial polyp 07/15/2016  . Class 3 obesity due to excess calories with body mass index (BMI) of 50.0 to 59.9 in adult 07/15/2016  . Family history of breast cancer in first degree relative 07/15/2016  . Family history of ovarian cancer 07/15/2016    Blythe Stanford, PT DPT 10/19/2017, 4:48 PM  [facility] [address], [place], [address] Phone: [phone]   Fax:  [phone]  Name: Whitney [name] MRN: [id number] Date of Birth: Jun 26, 1951

## 2017-10-19 NOTE — ED Notes (Signed)
IV team was able to establish line but unable to obtain blood - lab called to draw blood and MD notified

## 2017-10-19 NOTE — ED Notes (Signed)
The lab came and drew pt blood

## 2017-10-19 NOTE — H&P (Signed)
[facility] NAME: Whitney [name]    MR#:  [id number]  DATE OF BIRTH:  [birth year]  DATE OF ADMISSION:  10/19/2017  PRIMARY CARE PHYSICIAN: Ronnell Freshwater, NP   REQUESTING/REFERRING PHYSICIAN: Dr. Harvest Dark  CHIEF COMPLAINT: Chest pain   Chief Complaint  Patient presents with  . Chest Pain    HISTORY OF PRESENT ILLNESS:  Whitney [name]  is a 67 y.o. female with a known history of essential hypertension, recent knee replacement, chronic kidney disease presents to the emergency room for nausea, near syncope, abdominal pain inepigastric area going up to the chest.  Patient was at physical therapy and then she began to feel lightheaded, sweaty, nauseated and because of that EMS was called.  Patient was in bigeminy by EMS.  Patient had history of recurrent syncopal episodes in the last 1 year.  Each time she felt nauseous and also diaphoretic and ultimately she feels like she is going to pass out.  But recently she is feeling like dizzy often,.  In the ER also she noted to have nausea and heart rate dropped to 40 bpm and  shortly after heart rate improved to 80 bpm. PAST MEDICAL HISTORY:   Past Medical History:  Diagnosis Date  . Abnormal Pap smear of cervix    Pt states she had colposcopy   . Arthritis   . Chronic kidney disease   . Hx of unilateral nephrectomy 1979   Left Nephrectomy  . Hypertension   . PMB (postmenopausal bleeding)   . Restless leg syndrome   . Vitamin D deficiency     PAST SURGICAL HISTOIRY:   Past Surgical History:  Procedure Laterality Date  . HYSTEROSCOPY W/D&C N/A 02/16/2017   Procedure: DILATATION AND CURETTAGE /HYSTEROSCOPY;  Surgeon: Defrancesco, Alanda Slim, MD;  Location: [facility];  Service: Gynecology;  Laterality: N/A;  . KIDNEY SURGERY Left 1979   left kidney removed  . TOTAL KNEE ARTHROPLASTY Left 07/06/2017   Procedure: TOTAL KNEE ARTHROPLASTY;  Surgeon: Lovell Sheehan, MD;   Location: [facility];  Service: Orthopedics;  Laterality: Left;  . WRIST ARTHROSCOPY Left 1970s    SOCIAL HISTORY:   Social History   Tobacco Use  . Smoking status: Never Smoker  . Smokeless tobacco: Never Used  Substance Use Topics  . Alcohol use: Yes    Comment: occasionally    FAMILY HISTORY:   Family History  Problem Relation Age of Onset  . Ovarian cancer Mother   . Hypertension Father   . Diabetes Father   . Cancer Father   . Breast cancer Sister   . Diabetes Sister     DRUG ALLERGIES:   Allergies  Allergen Reactions  . Enalapril Swelling  . Lisinopril Swelling    REVIEW OF SYSTEMS:  CONSTITUTIONAL: No fever, fatigue or weakness.  EYES: No blurred or double vision.  EARS, NOSE, AND THROAT: No tinnitus or ear pain.  RESPIRATORY: No cough, shortness of breath, wheezing or hemoptysis.  CARDIOVASCULAR: No chest pain, orthopnea, edema.  GASTROINTESTINAL: No nausea, vomiting, diarrhea or abdominal pain.  GENITOURINARY: No dysuria, hematuria.  ENDOCRINE: No polyuria, nocturia,  HEMATOLOGY: No anemia, easy bruising or bleeding SKIN: No rash or lesion. MUSCULOSKELETAL: No joint pain or arthritis.   NEUROLOGIC: No tingling, numbness, weakness.  PSYCHIATRY: No anxiety or depression.   MEDICATIONS AT HOME:   Prior to Admission medications   Medication Sig Start Date End Date Taking? Authorizing Provider  acetaminophen (TYLENOL) 325  MG tablet Take 650 mg by mouth 4 (four) times daily as needed.    Yes [provider]  amLODipine (NORVASC) 10 MG tablet Take 10 mg by mouth daily.    Yes [provider]  carvedilol (COREG) 3.125 MG tablet Take 1 tablet (3.125 mg total) by mouth 2 (two) times daily with a meal. 10/06/17  Yes Boscia, Heather E, NP  famotidine (PEPCID) 20 MG tablet Take 20 mg by mouth daily as needed.    Yes [provider]  gabapentin (NEURONTIN) 300 MG capsule Take 300 mg by mouth 3 (three) times daily.  06/04/17  Yes [provider]  hydrALAZINE (APRESOLINE) 25 MG tablet Take 25 mg by mouth 3 (three) times daily.   Yes [provider]  lip balm (BLISTEX) OINT Apply liberal amount to cold sores as needed for pain/irritation. Ok to leave at bedside for pt to apply   Yes [provider]  methocarbamol (ROBAXIN) 500 MG tablet Take 500 mg by mouth 3 (three) times daily as needed.    Yes [provider]  rOPINIRole (REQUIP) 0.5 MG tablet Take 0.5 mg at bedtime by mouth. Takes 1 or 2 tablets as needed   Yes [provider]  traMADol (ULTRAM) 50 MG tablet Take 1 tablet (50 mg total) by mouth every 12 (twelve) hours as needed. 10/06/17  Yes Ronnell Freshwater, NP  aspirin EC 325 MG EC tablet Take 1 tablet (325 mg total) by mouth daily with breakfast. Patient not taking: Reported on 10/19/2017 07/09/17   Lovell Sheehan, MD  docusate sodium (COLACE) 100 MG capsule Take 1 capsule (100 mg total) by mouth 2 (two) times daily. Patient not taking: Reported on 10/19/2017 07/08/17   Lovell Sheehan, MD  hydrALAZINE (APRESOLINE) 10 MG tablet Take 1 tablet (10 mg total) by mouth 3 (three) times daily. Hold for low blood pressure systolic less than 628 mmHg, diastolic less than 70 mmHg Patient not taking: Reported on 10/19/2017 07/08/17   Lovell Sheehan, MD      VITAL SIGNS:  Blood pressure (!) 148/84, pulse 66, temperature 98 F (36.7 C), temperature source Oral, resp. rate 20, height 5\' 5"  (1.651 m), weight 108.9 kg (240 lb), last menstrual period 01/26/2017, SpO2 96 %.  PHYSICAL EXAMINATION:  GENERAL:  67 y.o.-year-old patient lying in the bed with no acute distress.  EYES: Pupils equal, round, reactive to light and accommodation. No scleral icterus. Extraocular muscles intact.  HEENT: Head atraumatic, normocephalic. Oropharynx and nasopharynx clear.  NECK:  Supple, no jugular venous distention. No thyroid enlargement, no tenderness.  LUNGS: Normal breath sounds bilaterally, no wheezing,  rales,rhonchi or crepitation. No use of accessory muscles of respiration.  CARDIOVASCULAR: S1, S2 normal. No murmurs, rubs, or gallops.  ABDOMEN: Soft, nontender, nondistended. Bowel sounds present. No organomegaly or mass.  EXTREMITIES: No pedal edema, cyanosis, or clubbing.  NEUROLOGIC: Cranial nerves II through XII are intact. Muscle strength 5/5 in all extremities. Sensation intact. Gait not checked.  PSYCHIATRIC: The patient is alert and oriented x 3.  SKIN: No obvious rash, lesion, or ulcer.   LABORATORY PANEL:   CBC Recent Labs  Lab 10/19/17 1751  WBC 6.3  HGB 13.3  HCT 41.4  PLT 310   ------------------------------------------------------------------------------------------------------------------  Chemistries  Recent Labs  Lab 10/19/17 1700  NA 135  K 4.6  CL 102  CO2 24  GLUCOSE 87  BUN 19  CREATININE 1.12*  CALCIUM 8.3*   ------------------------------------------------------------------------------------------------------------------  Cardiac Enzymes Recent  Labs  Lab 10/19/17 1700  TROPONINI <0.03   ------------------------------------------------------------------------------------------------------------------  RADIOLOGY:  Dg Chest Portable 1 View  Result Date: 10/19/2017 CLINICAL DATA:  Chest pain and lightheadedness EXAM: PORTABLE CHEST 1 VIEW COMPARISON:  06/29/2017 FINDINGS: The heart size and mediastinal contours are within normal limits. Both lungs are clear. The visualized skeletal structures are unremarkable. IMPRESSION: No active disease. Electronically Signed   By: Inez Catalina M.D.   On: 10/19/2017 16:12    EKG:   Orders placed or performed during the hospital encounter of 10/19/17  . ED EKG within 10 minutes  . ED EKG within 10 minutes  EKG showed normal sinus rhythm 78 bpm with normal with narrow QRS complexes ,normal axis normal intervals no ST-T changes  IMPRESSION AND PLAN:   67 year old female patient with recurrent episodes of  bradycardia and dizziness: Evaluate for bradycardia likely vasovagal.  But evaluate for cardiogenic syncope.  Discontinue Coreg.  I spoke with Dr. Ubaldo Glassing.  Consulted cardiology.  Echocardiogram done in November 2018 showed EF 60-65% with normal wall motion.  #2 history of left total knee replacement by Dr. Marica Otter recently in December 2018, getting outpatient physical therapy. 3.  History of chronic kidney disease stage3; stable. 4.  History of restless leg syndrome: Patient states she takes Requip as needed and does not take every day. All the records are reviewed and case discussed with ED provider. Management plans discussed with the patient, family and they are in agreement.  CODE STATUS: full TOTAL TIME TAKING CARE OF THIS PATIENT: 55 minutes.    Epifanio Lesches M.D on 10/19/2017 at 7:45 PM  Between 7am to 6pm - Pager - [phone]  After 6pm go to [email or web address] - password [email or web address] [facility]  Office  [phone]  CC: Primary care physician; Ronnell Freshwater, NP  Note: This dictation was prepared with Dragon dictation along with smaller phrase technology. Any transcriptional errors that result from this process are unintentional.

## 2017-10-20 ENCOUNTER — Observation Stay: Payer: Medicare Other

## 2017-10-20 ENCOUNTER — Observation Stay (HOSPITAL_BASED_OUTPATIENT_CLINIC_OR_DEPARTMENT_OTHER): Payer: Medicare Other

## 2017-10-20 DIAGNOSIS — G2581 Restless legs syndrome: Secondary | ICD-10-CM | POA: Diagnosis not present

## 2017-10-20 DIAGNOSIS — R55 Syncope and collapse: Secondary | ICD-10-CM

## 2017-10-20 DIAGNOSIS — R079 Chest pain, unspecified: Secondary | ICD-10-CM | POA: Diagnosis not present

## 2017-10-20 LAB — CBC
HCT: 40.1 % (ref 35.0–47.0)
Hemoglobin: 12.9 g/dL (ref 12.0–16.0)
MCH: 27.9 pg (ref 26.0–34.0)
MCHC: 32.1 g/dL (ref 32.0–36.0)
MCV: 87 fL (ref 80.0–100.0)
Platelets: 285 10*3/uL (ref 150–440)
RBC: 4.61 MIL/uL (ref 3.80–5.20)
RDW: 14.8 % — ABNORMAL HIGH (ref 11.5–14.5)
WBC: 5.8 10*3/uL (ref 3.6–11.0)

## 2017-10-20 LAB — BASIC METABOLIC PANEL
Anion gap: 8 (ref 5–15)
BUN: 18 mg/dL (ref 6–20)
CO2: 24 mmol/L (ref 22–32)
Calcium: 8 mg/dL — ABNORMAL LOW (ref 8.9–10.3)
Chloride: 103 mmol/L (ref 101–111)
Creatinine, Ser: 1.1 mg/dL — ABNORMAL HIGH (ref 0.44–1.00)
GFR calc Af Amer: 59 mL/min — ABNORMAL LOW (ref >60)
GFR calc non Af Amer: 51 mL/min — ABNORMAL LOW (ref >60)
Glucose, Bld: 89 mg/dL (ref 65–99)
Potassium: 4.2 mmol/L (ref 3.5–5.1)
Sodium: 135 mmol/L (ref 135–145)

## 2017-10-20 LAB — GLUCOSE, CAPILLARY: Glucose-Capillary: 83 mg/dL (ref 65–99)

## 2017-10-20 MED ORDER — TRAZODONE HCL 50 MG PO TABS
50.0000 mg | ORAL_TABLET | Freq: Every evening | ORAL | Status: DC | PRN
  Administered 2017-10-20 – 2017-10-21 (×2): 50 mg via ORAL
  Filled 2017-10-20 (×2): qty 1

## 2017-10-20 MED ORDER — SODIUM CHLORIDE 0.9% FLUSH
3.0000 mL | Freq: Two times a day (BID) | INTRAVENOUS | Status: DC
Start: 2017-10-20 — End: 2017-10-22
  Administered 2017-10-20 – 2017-10-21 (×3): 3 mL via INTRAVENOUS

## 2017-10-20 MED ORDER — IOPAMIDOL (ISOVUE-300) INJECTION 61%: 15.0000 mL | INTRAVENOUS | Status: AC

## 2017-10-20 MED ORDER — MIDODRINE HCL 5 MG PO TABS: 5.0000 mg | ORAL_TABLET | Freq: Three times a day (TID) | ORAL | Status: DC

## 2017-10-20 NOTE — Consult Note (Signed)
Reason for Consult:Syncope Referring Physician: [place]  CC: Syncope  HPI: Whitney [name] is an 67 y.o. female who on yesterday was at therapy and had acute onset of lightheadedness and diaphoresis.  Patient was nauseous as well.  She had some abdominal pain and then near syncope.  Patient was brought to the emergency room where she was found to be in bigeminy.  Patient was admitted at that time.  Patient reports that she has had 4 similar events 3 of which she lost full consciousness.  She reports that these episodes started about a year ago.  They have been separated by months except for these last 2 that were separated by a week.  She reports that these episodes always began with abdominal discomfort.  Patient reports that she then becomes dizzy and will then feel as if she is going to pass out or pass out.  They have occurred at either the standing or sitting position.  She does report a urinary incontinence on one occasion.  On 3 of her of the previous 4 occasions of prior to her presentation she was drinking and doing drugs at the time.  On the occasion about a week ago this was not the case.  In addition to the bigeminy patient is also been noted during this hospitalization to be bradycardic as well.  She did have some symptoms with her bradycardia.  Cardiology is following the patient.  Past Medical History:  Diagnosis Date  . Abnormal Pap smear of cervix    Pt states she had colposcopy   . Arthritis   . Chronic kidney disease   . Hx of unilateral nephrectomy 1979   Left Nephrectomy  . Hypertension   . PMB (postmenopausal bleeding)   . Restless leg syndrome   . Vitamin D deficiency     Past Surgical History:  Procedure Laterality Date  . HYSTEROSCOPY W/D&C N/A 02/16/2017   Procedure: DILATATION AND CURETTAGE /HYSTEROSCOPY;  Surgeon: Defrancesco, Alanda Slim, MD;  Location: [facility] ORS;  Service: Gynecology;  Laterality: N/A;  . KIDNEY SURGERY Left 1979   left kidney removed  . TOTAL KNEE  ARTHROPLASTY Left 07/06/2017   Procedure: TOTAL KNEE ARTHROPLASTY;  Surgeon: Lovell Sheehan, MD;  Location: [facility] ORS;  Service: Orthopedics;  Laterality: Left;  . WRIST ARTHROSCOPY Left 1970s    Family History  Problem Relation Age of Onset  . Ovarian cancer Mother   . Hypertension Father   . Diabetes Father   . Cancer Father   . Breast cancer Sister   . Diabetes Sister     Social History:  reports that  has never smoked. she has never used smokeless tobacco. She reports that she drinks alcohol. She reports that she uses drugs. Drugs: Marijuana and Cocaine. Frequency: 3.00 times per week.  Allergies  Allergen Reactions  . Enalapril Swelling  . Lisinopril Swelling    Medications:  I have reviewed the patient's current medications. Prior to Admission:  Medications Prior to Admission  Medication Sig Dispense Refill Last Dose  . acetaminophen (TYLENOL) 325 MG tablet Take 650 mg by mouth 4 (four) times daily as needed.    PRN at PRN  . amLODipine (NORVASC) 10 MG tablet Take 10 mg by mouth daily.    10/19/2017 at AM  . carvedilol (COREG) 3.125 MG tablet Take 1 tablet (3.125 mg total) by mouth 2 (two) times daily with a meal. 60 tablet 3 N/A at N/A  . famotidine (PEPCID) 20 MG tablet Take 20 mg by mouth  daily as needed.    PRN at PRN  . gabapentin (NEURONTIN) 300 MG capsule Take 300 mg by mouth 3 (three) times daily.   0 PRN at PRN  . hydrALAZINE (APRESOLINE) 25 MG tablet Take 25 mg by mouth 3 (three) times daily.   N/A at N/A  . lip balm (BLISTEX) OINT Apply liberal amount to cold sores as needed for pain/irritation. Ok to leave at bedside for pt to apply   UTD at UTD  . methocarbamol (ROBAXIN) 500 MG tablet Take 500 mg by mouth 3 (three) times daily as needed.    PRN at PRN  . rOPINIRole (REQUIP) 0.5 MG tablet Take 0.5 mg at bedtime by mouth. Takes 1 or 2 tablets as needed   PRN at PRN  . traMADol (ULTRAM) 50 MG tablet Take 1 tablet (50 mg total) by mouth every 12 (twelve) hours as  needed. 60 tablet 2 PRN at PRN  . aspirin EC 325 MG EC tablet Take 1 tablet (325 mg total) by mouth daily with breakfast. (Patient not taking: Reported on 10/19/2017) 30 tablet 0 Not Taking at --  . docusate sodium (COLACE) 100 MG capsule Take 1 capsule (100 mg total) by mouth 2 (two) times daily. (Patient not taking: Reported on 10/19/2017) 10 capsule 0 Not Taking at --  . hydrALAZINE (APRESOLINE) 10 MG tablet Take 1 tablet (10 mg total) by mouth 3 (three) times daily. Hold for low blood pressure systolic less than 253 mmHg, diastolic less than 70 mmHg (Patient not taking: Reported on 10/19/2017) 30 tablet 1 Not Taking at --   Scheduled: . acetaminophen  650 mg Oral QID  . aspirin  325 mg Oral Q breakfast  . docusate sodium  100 mg Oral BID  . enoxaparin (LOVENOX) injection  40 mg Subcutaneous Q24H  . hydrALAZINE  10 mg Oral TID  . senna  2 tablet Oral BID  . [START ON 10/25/2017] Vitamin D (Ergocalciferol)  50,000 Units Oral Q Sun    ROS: History obtained from the patient  General ROS: negative for - chills, fatigue, fever, night sweats, weight gain or weight loss Psychological ROS: negative for - behavioral disorder, hallucinations, memory difficulties, mood swings or suicidal ideation Ophthalmic ROS: negative for - blurry vision, double vision, eye pain or loss of vision ENT ROS: as noted in HPI Allergy and Immunology ROS: negative for - hives or itchy/watery eyes Hematological and Lymphatic ROS: negative for - bleeding problems, bruising or swollen lymph nodes Endocrine ROS: negative for - galactorrhea, hair pattern changes, polydipsia/polyuria or temperature intolerance Respiratory ROS: negative for - cough, hemoptysis, shortness of breath or wheezing Cardiovascular ROS: negative for - chest pain, dyspnea on exertion, edema or irregular heartbeat Gastrointestinal ROS: as noted in HPI Genito-Urinary ROS: negative for - dysuria, hematuria, incontinence or urinary  frequency/urgency Musculoskeletal ROS: knee pain Neurological ROS: as noted in HPI Dermatological ROS: negative for rash and skin lesion changes  Physical Examination: Blood pressure (!) 145/76, pulse 63, temperature 98.1 F (36.7 C), temperature source Oral, resp. rate 17, height 5\' 5"  (1.651 m), weight 107.7 kg (237 lb 8 oz), last menstrual period 01/26/2017, SpO2 100 %.  HEENT-  Normocephalic, no lesions, without obvious abnormality.  Normal external eye and conjunctiva.  Normal TM's bilaterally.  Normal auditory canals and external ears. Normal external nose, mucus membranes and septum.  Normal pharynx. Cardiovascular- S1, S2 normal, pulses palpable throughout   Lungs- chest clear, no wheezing, rales, normal symmetric air entry Abdomen- soft, non-tender; bowel  sounds normal; no masses,  no organomegaly Extremities- no edema Lymph-no adenopathy palpable Musculoskeletal-no joint tenderness, deformity or swelling Skin-warm and dry, no hyperpigmentation, vitiligo, or suspicious lesions  Neurological Examination   Mental Status: Alert, oriented, thought content appropriate.  Speech fluent without evidence of aphasia.  Able to follow 3 step commands without difficulty. Cranial Nerves: II: Discs flat bilaterally; Visual fields grossly normal, pupils equal, round, reactive to light and accommodation III,IV, VI: ptosis not present, extra-ocular motions intact bilaterally V,VII: smile symmetric, facial light touch sensation normal bilaterally VIII: hearing normal bilaterally IX,X: gag reflex present XI: bilateral shoulder shrug XII: midline tongue extension Motor: Right : Upper extremity   5/5    Left:     Upper extremity   5/5  Lower extremity   5/5     Lower extremity   5/5 Tone and bulk:normal tone throughout; no atrophy noted Sensory: Pinprick and light touch intact throughout, bilaterally Deep Tendon Reflexes: 2+ in the upper extremities, absent in the lower  extremities Plantars: Right: downgoing   Left: mute Cerebellar: Normal finger-to-nose and normal heel-to-shin testing bilaterally Gait: not tested due to safety concerns   Laboratory Studies:   Basic Metabolic Panel: Recent Labs  Lab 10/19/17 1700 10/20/17 0452  NA 135 135  K 4.6 4.2  CL 102 103  CO2 24 24  GLUCOSE 87 89  BUN 19 18  CREATININE 1.12* 1.10*  CALCIUM 8.3* 8.0*    Liver Function Tests: No results for input(s): AST, ALT, ALKPHOS, BILITOT, PROT, ALBUMIN in the last 168 hours. No results for input(s): LIPASE, AMYLASE in the last 168 hours. No results for input(s): AMMONIA in the last 168 hours.  CBC: Recent Labs  Lab 10/19/17 1751 10/20/17 0452  WBC 6.3 5.8  HGB 13.3 12.9  HCT 41.4 40.1  MCV 87.7 87.0  PLT 310 285    Cardiac Enzymes: Recent Labs  Lab 10/19/17 1700  TROPONINI <0.03    BNP: Invalid input(s): POCBNP  CBG: Recent Labs  Lab 10/20/17 0723  GLUCAP 83    Microbiology: Results for orders placed or performed in visit on 10/06/17  Microscopic Examination     Status: Abnormal   Collection Time: 10/06/17  4:24 PM  Result Value Ref Range Status   WBC, UA 0-5 0 - 5 /hpf Final   RBC, UA 0-2 0 - 2 /hpf Final   Epithelial Cells (non renal) >10 (A) 0 - 10 /hpf Final   Casts None seen None seen /lpf Final   Mucus, UA Present Not Estab. Final   Bacteria, UA Few None seen/Few Final    Coagulation Studies: No results for input(s): LABPROT, INR in the last 72 hours.  Urinalysis: No results for input(s): COLORURINE, LABSPEC, PHURINE, GLUCOSEU, HGBUR, BILIRUBINUR, KETONESUR, PROTEINUR, UROBILINOGEN, NITRITE, LEUKOCYTESUR in the last 168 hours.  Invalid input(s): APPERANCEUR  Lipid Panel:  No results found for: CHOL, TRIG, HDL, CHOLHDL, VLDL, LDLCALC  HgbA1C: No results found for: HGBA1C  Urine Drug Screen:      Component Value Date/Time   LABOPIA NONE DETECTED 07/06/2017 0611   COCAINSCRNUR NONE DETECTED 07/06/2017 0611    LABBENZ NONE DETECTED 07/06/2017 0611   AMPHETMU NONE DETECTED 07/06/2017 0611   THCU NONE DETECTED 07/06/2017 0611   LABBARB NONE DETECTED 07/06/2017 0611    Alcohol Level: No results for input(s): ETH in the last 168 hours.  Other results: EKG: sinus rhythm at 78 bpm.  Imaging: Ct Abdomen Pelvis Wo Contrast  Result Date: 10/20/2017 CLINICAL DATA:  Abdominal distention, some nausea, dizziness, bradycardia EXAM: CT ABDOMEN AND PELVIS WITHOUT CONTRAST TECHNIQUE: Multidetector CT imaging of the abdomen and pelvis was performed following the standard protocol without IV contrast. COMPARISON:  None. FINDINGS: Lower chest: The lung bases are clear. The heart is moderately enlarged. No pericardial effusion is seen. Coronary artery calcifications are present primarily in the distribution of the left anterior descending and circumflex coronary arteries Hepatobiliary: The liver enhances and there are several low-attenuation structures at least 1 of which appears to represent a cyst. The other is more medially position and could represent hemangioma but assessment by ultrasound or preferably MRI may be helpful. No ductal dilatation is seen. A faint hepatic granuloma is present in the right lobe. No calcified gallstones are seen. Pancreas: The pancreas is fairly well visualized. There is an area of low-attenuation on image 28 series 2. However on sagittal and coronal images this may represent a ridging through a lobulation. This area could also be assessed by ultrasound or preferably MRI. No pancreatic ductal dilatation is seen. Spleen: The spleen is unremarkable. Adrenals/Urinary Tract: The adrenal glands appear normal. The left kidney has previously been resected the right kidney has lobular contours there is a slightly lower attenuation area emanating from the lower pole of the right kidney and ultrasound or MRI could also assess this area. No hydronephrosis is seen. There is a single focus of renovascular  calcification present. The right ureter is normal in caliber. The urinary bladder is not optimally distended but no abnormality is seen. Stomach/Bowel: The stomach is moderately distended with food debris and oral contrast. No abnormality is seen. No small bowel dilatation is evident and there is no evidence of edema of the mucosa. Multiple rectosigmoid colon diverticula are present with diverticula also throughout the descending colon. Scattered more proximal colonic diverticula also are noted. No diverticulitis is seen. The terminal ileum and the appendix are unremarkable. Vascular/Lymphatic: Moderate abdominal aortic atherosclerosis is present. No adenopathy is seen. Reproductive: The uterus is somewhat lobular in contour which may indicate the presence of small fibroids, 1 of which may be pedunculated on the left. No adnexal lesion is seen. Other: A small umbilical hernia is present containing fat. There is also a lipoma within the musculature of the proximal anterior right thigh. Musculoskeletal: The lumbar vertebrae are in normal alignment with diffuse degenerative disc disease present. No compression deformity is seen. IMPRESSION: 1. No definite explanation for the patient's symptoms is seen. 2. Small low-attenuation area within the body of the pancreas may be volume averaging but MRI or ultrasound may be helpful to assess further. 3. Lobular right kidney with questionable low-attenuation area in the lower pole. Again MRI or ultrasound may be helpful to assess further. No hydronephrosis. Prior left nephrectomy. 4. Suspect small uterine fibroids. 5. Moderate abdominal aortic atherosclerosis and diffuse coronary artery calcifications. 6. Multiple colonic diverticula primarily at within the rectosigmoid and descending colon. Electronically Signed   By: Ivar Drape M.D.   On: 10/20/2017 12:25   Dg Chest Portable 1 View  Result Date: 10/19/2017 CLINICAL DATA:  Chest pain and lightheadedness EXAM: PORTABLE  CHEST 1 VIEW COMPARISON:  06/29/2017 FINDINGS: The heart size and mediastinal contours are within normal limits. Both lungs are clear. The visualized skeletal structures are unremarkable. IMPRESSION: No active disease. Electronically Signed   By: Inez Catalina M.D.   On: 10/19/2017 16:12     Assessment/Plan: 67 year old female presenting after recurrent syncopal episodes.  The patient does report premonitory symptoms of abdominal  discomfort.  Patient did have a symptomatic.  Of bradycardia during this hospitalization.  There is concerned that the cardiac abnormalities may be secondary to a more neurologic cause.  Neurological examination is nonfocal.  Patient has had an EEG during this hospitalization that was unremarkable.  Will perform imaging as well.  Patient is not orthostatic.  Recommendations: 1.  Patient have MRI of the brain without contrast 2.  Agree with prolonged cardiac monitoring as recommended by cardiology on outpatient basis. 3.  Patient to follow-up with neurology.  She may benefit from prolonged EEG monitoring as well on an outpatient basis.   Alexis Goodell, MD Neurology [phone] 10/20/2017, 1:43 PM

## 2017-10-20 NOTE — Care Management Obs Status (Signed)
[facility] NOTIFICATION   Patient Details  Name: Zamya Culhane MRN: [id number] Date of Birth: [birth year]   Medicare Observation Status Notification Given:     Patient declined to sign.  Marshell Garfinkel, RN 10/20/2017, 10:04 AM

## 2017-10-20 NOTE — Progress Notes (Signed)
Patient Name: Whitney [name] Date of Encounter: 10/20/2017  Hospital Problem List     Active Problems:   Symptomatic bradycardia   Near syncope    Patient Profile     Pt with intermittent chest pain and syncope   Subjective   Continued syncoope  Inpatient Medications    . acetaminophen  650 mg Oral QID  . aspirin  325 mg Oral Q breakfast  . docusate sodium  100 mg Oral BID  . enoxaparin (LOVENOX) injection  40 mg Subcutaneous Q24H  . hydrALAZINE  10 mg Oral TID  . senna  2 tablet Oral BID  . [START ON 10/25/2017] Vitamin D (Ergocalciferol)  50,000 Units Oral Q Sun    Vital Signs    Vitals:   10/19/17 2014 10/19/17 2148 10/20/17 0345 10/20/17 0722  BP: (!) 128/106 136/66 128/72 (!) 145/76  Pulse: 71 70 69 63  Resp: 18  17 17   Temp: (!) 97.5 F (36.4 C)  98.1 F (36.7 C) 98.1 F (36.7 C)  TempSrc: Oral  Oral Oral  SpO2: 100%  94% 100%  Weight: 108.2 kg (238 lb 9.6 oz)  107.7 kg (237 lb 8 oz)   Height: 5\' 5"  (1.651 m)       Intake/Output Summary (Last 24 hours) at 10/20/2017 1359 Last data filed at 10/19/2017 2100 Gross per 24 hour  Intake -  Output 300 ml  Net -300 ml   Filed Weights   10/19/17 1533 10/19/17 2014 10/20/17 0345  Weight: 108.9 kg (240 lb) 108.2 kg (238 lb 9.6 oz) 107.7 kg (237 lb 8 oz)    Physical Exam    GEN: Well nourished, well developed, in no acute distress.  HEENT: normal.  Neck: Supple, no JVD, carotid bruits, or masses. Cardiac: RRR, no murmurs, rubs, or gallops. No clubbing, cyanosis, edema.  Radials/DP/PT 2+ and equal bilaterally.  Respiratory:  Respirations regular and unlabored, clear to auscultation bilaterally. GI: Soft, nontender, nondistended, BS + x 4. MS: no deformity or atrophy. Skin: warm and dry, no rash. Neuro:  Strength and sensation are intact. Psych: Normal affect.  Labs    CBC Recent Labs    10/19/17 1751 10/20/17 0452  WBC 6.3 5.8  HGB 13.3 12.9  HCT 41.4 40.1  MCV 87.7 87.0  PLT 310 867   Basic  Metabolic Panel Recent Labs    10/19/17 1700 10/20/17 0452  NA 135 135  K 4.6 4.2  CL 102 103  CO2 24 24  GLUCOSE 87 89  BUN 19 18  CREATININE 1.12* 1.10*  CALCIUM 8.3* 8.0*   Liver Function Tests No results for input(s): AST, ALT, ALKPHOS, BILITOT, PROT, ALBUMIN in the last 72 hours. No results for input(s): LIPASE, AMYLASE in the last 72 hours. Cardiac Enzymes Recent Labs    10/19/17 1700  TROPONINI <0.03   BNP No results for input(s): BNP in the last 72 hours. D-Dimer No results for input(s): DDIMER in the last 72 hours. Hemoglobin A1C No results for input(s): HGBA1C in the last 72 hours. Fasting Lipid Panel No results for input(s): CHOL, HDL, LDLCALC, TRIG, CHOLHDL, LDLDIRECT in the last 72 hours. Thyroid Function Tests No results for input(s): TSH, T4TOTAL, T3FREE, THYROIDAB in the last 72 hours.  Invalid input(s): FREET3  Telemetry    nsr with bigeminy  ECG    nsr  Radiology    Ct Abdomen Pelvis Wo Contrast  Result Date: 10/20/2017 CLINICAL DATA:  Abdominal distention, some nausea, dizziness, bradycardia EXAM: CT ABDOMEN  AND PELVIS WITHOUT CONTRAST TECHNIQUE: Multidetector CT imaging of the abdomen and pelvis was performed following the standard protocol without IV contrast. COMPARISON:  None. FINDINGS: Lower chest: The lung bases are clear. The heart is moderately enlarged. No pericardial effusion is seen. Coronary artery calcifications are present primarily in the distribution of the left anterior descending and circumflex coronary arteries Hepatobiliary: The liver enhances and there are several low-attenuation structures at least 1 of which appears to represent a cyst. The other is more medially position and could represent hemangioma but assessment by ultrasound or preferably MRI may be helpful. No ductal dilatation is seen. A faint hepatic granuloma is present in the right lobe. No calcified gallstones are seen. Pancreas: The pancreas is fairly well  visualized. There is an area of low-attenuation on image 28 series 2. However on sagittal and coronal images this may represent a ridging through a lobulation. This area could also be assessed by ultrasound or preferably MRI. No pancreatic ductal dilatation is seen. Spleen: The spleen is unremarkable. Adrenals/Urinary Tract: The adrenal glands appear normal. The left kidney has previously been resected the right kidney has lobular contours there is a slightly lower attenuation area emanating from the lower pole of the right kidney and ultrasound or MRI could also assess this area. No hydronephrosis is seen. There is a single focus of renovascular calcification present. The right ureter is normal in caliber. The urinary bladder is not optimally distended but no abnormality is seen. Stomach/Bowel: The stomach is moderately distended with food debris and oral contrast. No abnormality is seen. No small bowel dilatation is evident and there is no evidence of edema of the mucosa. Multiple rectosigmoid colon diverticula are present with diverticula also throughout the descending colon. Scattered more proximal colonic diverticula also are noted. No diverticulitis is seen. The terminal ileum and the appendix are unremarkable. Vascular/Lymphatic: Moderate abdominal aortic atherosclerosis is present. No adenopathy is seen. Reproductive: The uterus is somewhat lobular in contour which may indicate the presence of small fibroids, 1 of which may be pedunculated on the left. No adnexal lesion is seen. Other: A small umbilical hernia is present containing fat. There is also a lipoma within the musculature of the proximal anterior right thigh. Musculoskeletal: The lumbar vertebrae are in normal alignment with diffuse degenerative disc disease present. No compression deformity is seen. IMPRESSION: 1. No definite explanation for the patient's symptoms is seen. 2. Small low-attenuation area within the body of the pancreas may be volume  averaging but MRI or ultrasound may be helpful to assess further. 3. Lobular right kidney with questionable low-attenuation area in the lower pole. Again MRI or ultrasound may be helpful to assess further. No hydronephrosis. Prior left nephrectomy. 4. Suspect small uterine fibroids. 5. Moderate abdominal aortic atherosclerosis and diffuse coronary artery calcifications. 6. Multiple colonic diverticula primarily at within the rectosigmoid and descending colon. Electronically Signed   By: Ivar Drape M.D.   On: 10/20/2017 12:25   Dg Chest Portable 1 View  Result Date: 10/19/2017 CLINICAL DATA:  Chest pain and lightheadedness EXAM: PORTABLE CHEST 1 VIEW COMPARISON:  06/29/2017 FINDINGS: The heart size and mediastinal contours are within normal limits. Both lungs are clear. The visualized skeletal structures are unremarkable. IMPRESSION: No active disease. Electronically Signed   By: Inez Catalina M.D.   On: 10/19/2017 16:12    Assessment & Plan    Syncope/chest tightness-pt with episodic chest and abdominal dyscomfort with diapharesis and transient bradycardia during the events. Has ruled out for mi.  Heavy calcification on coronary arteries noted with abdominal ct scan. Had mildly reduced lv function by functional study with no obvious ischemia in 11/18.  Will need to evaluate for coronary artery etiology of cardiomyopathy and symptoms. Further recs after cath. Risks and benefits discussed.   Signed, Javier Docker Kazmir Oki MD 10/20/2017, 1:59 PM  Pager: [phone]

## 2017-10-20 NOTE — Care Management Obs Status (Signed)
[facility] NOTIFICATION   Patient Details  Name: Syrianna Schillaci MRN: [id number] Date of Birth: Dec 02, 1950   Medicare Observation Status Notification Given:  Yes Patient declined to sign.   Marshell Garfinkel, RN 10/20/2017, 10:23 AM

## 2017-10-20 NOTE — Progress Notes (Signed)
Patient ID: Whitney [name], female   DOB: 08/26/1950, 67 y.o.   MRN: [id number]  [facility] PROGRESS NOTE  Whitney [name] WUJ:[id number] DOB: 07/14/1951 DOA: 10/19/2017 PCP: Whitney Freshwater, NP  HPI/Subjective: Patient with numerous episodes of passing out.  She states that she feels it coming on her face gets really hot and she breaks out into a sweat.  She develops nausea and some abdominal discomfort.  No diarrhea.  One time she actually urinated on herself with passing out.  She states it is happening more frequently.  Objective: Vitals:   10/20/17 0345 10/20/17 0722  BP: 128/72 (!) 145/76  Pulse: 69 63  Resp: 17 17  Temp: 98.1 F (36.7 C) 98.1 F (36.7 C)  SpO2: 94% 100%    Filed Weights   10/19/17 1533 10/19/17 2014 10/20/17 0345  Weight: 108.9 kg (240 lb) 108.2 kg (238 lb 9.6 oz) 107.7 kg (237 lb 8 oz)    ROS: Review of Systems  Constitutional: Negative for chills and fever.  Eyes: Negative for blurred vision.  Respiratory: Negative for cough and shortness of breath.   Cardiovascular: Negative for chest pain.  Gastrointestinal: Positive for nausea. Negative for abdominal pain, constipation, diarrhea and vomiting.  Genitourinary: Negative for dysuria.  Musculoskeletal: Negative for joint pain.  Neurological: Negative for dizziness and headaches.   Exam: Physical Exam  Constitutional: She is oriented to person, place, and time.  HENT:  Nose: No mucosal edema.  Mouth/Throat: No oropharyngeal exudate or posterior oropharyngeal edema.  Eyes: Conjunctivae, EOM and lids are normal. Pupils are equal, round, and reactive to light.  Neck: No JVD present. Carotid bruit is not present. No edema present. No thyroid mass and no thyromegaly present.  Cardiovascular: S1 normal and S2 normal. Exam reveals no gallop.  No murmur heard. Pulses:      Dorsalis pedis pulses are 2+ on the right side, and 2+ on the left side.  Respiratory: No respiratory distress. She has no  wheezes. She has no rhonchi. She has no rales.  GI: Soft. Bowel sounds are normal. There is no tenderness.  Musculoskeletal:       Right ankle: She exhibits swelling.       Left ankle: She exhibits swelling.  Lymphadenopathy:    She has no cervical adenopathy.  Neurological: She is alert and oriented to person, place, and time. No cranial nerve deficit.  Skin: Skin is warm. No rash noted. Nails show no clubbing.  Psychiatric: She has a normal mood and affect.      Data Reviewed: Basic Metabolic Panel: Recent Labs  Lab 10/19/17 1700 10/20/17 0452  NA 135 135  K 4.6 4.2  CL 102 103  CO2 24 24  GLUCOSE 87 89  BUN 19 18  CREATININE 1.12* 1.10*  CALCIUM 8.3* 8.0*   CBC: Recent Labs  Lab 10/19/17 1751 10/20/17 0452  WBC 6.3 5.8  HGB 13.3 12.9  HCT 41.4 40.1  MCV 87.7 87.0  PLT 310 285   Cardiac Enzymes: Recent Labs  Lab 10/19/17 1700  TROPONINI <0.03    CBG: Recent Labs  Lab 10/20/17 0723  GLUCAP 83     Studies: Ct Abdomen Pelvis Wo Contrast  Result Date: 10/20/2017 CLINICAL DATA:  Abdominal distention, some nausea, dizziness, bradycardia EXAM: CT ABDOMEN AND PELVIS WITHOUT CONTRAST TECHNIQUE: Multidetector CT imaging of the abdomen and pelvis was performed following the standard protocol without IV contrast. COMPARISON:  None. FINDINGS: Lower chest: The lung bases are clear. The heart is moderately  enlarged. No pericardial effusion is seen. Coronary artery calcifications are present primarily in the distribution of the left anterior descending and circumflex coronary arteries Hepatobiliary: The liver enhances and there are several low-attenuation structures at least 1 of which appears to represent a cyst. The other is more medially position and could represent hemangioma but assessment by ultrasound or preferably MRI may be helpful. No ductal dilatation is seen. A faint hepatic granuloma is present in the right lobe. No calcified gallstones are seen. Pancreas: The  pancreas is fairly well visualized. There is an area of low-attenuation on image 28 series 2. However on sagittal and coronal images this may represent a ridging through a lobulation. This area could also be assessed by ultrasound or preferably MRI. No pancreatic ductal dilatation is seen. Spleen: The spleen is unremarkable. Adrenals/Urinary Tract: The adrenal glands appear normal. The left kidney has previously been resected the right kidney has lobular contours there is a slightly lower attenuation area emanating from the lower pole of the right kidney and ultrasound or MRI could also assess this area. No hydronephrosis is seen. There is a single focus of renovascular calcification present. The right ureter is normal in caliber. The urinary bladder is not optimally distended but no abnormality is seen. Stomach/Bowel: The stomach is moderately distended with food debris and oral contrast. No abnormality is seen. No small bowel dilatation is evident and there is no evidence of edema of the mucosa. Multiple rectosigmoid colon diverticula are present with diverticula also throughout the descending colon. Scattered more proximal colonic diverticula also are noted. No diverticulitis is seen. The terminal ileum and the appendix are unremarkable. Vascular/Lymphatic: Moderate abdominal aortic atherosclerosis is present. No adenopathy is seen. Reproductive: The uterus is somewhat lobular in contour which may indicate the presence of small fibroids, 1 of which may be pedunculated on the left. No adnexal lesion is seen. Other: A small umbilical hernia is present containing fat. There is also a lipoma within the musculature of the proximal anterior right thigh. Musculoskeletal: The lumbar vertebrae are in normal alignment with diffuse degenerative disc disease present. No compression deformity is seen. IMPRESSION: 1. No definite explanation for the patient's symptoms is seen. 2. Small low-attenuation area within the body of  the pancreas may be volume averaging but MRI or ultrasound may be helpful to assess further. 3. Lobular right kidney with questionable low-attenuation area in the lower pole. Again MRI or ultrasound may be helpful to assess further. No hydronephrosis. Prior left nephrectomy. 4. Suspect small uterine fibroids. 5. Moderate abdominal aortic atherosclerosis and diffuse coronary artery calcifications. 6. Multiple colonic diverticula primarily at within the rectosigmoid and descending colon. Electronically Signed   By: Ivar Drape M.D.   On: 10/20/2017 12:25   Dg Chest Portable 1 View  Result Date: 10/19/2017 CLINICAL DATA:  Chest pain and lightheadedness EXAM: PORTABLE CHEST 1 VIEW COMPARISON:  06/29/2017 FINDINGS: The heart size and mediastinal contours are within normal limits. Both lungs are clear. The visualized skeletal structures are unremarkable. IMPRESSION: No active disease. Electronically Signed   By: Inez Catalina M.D.   On: 10/19/2017 16:12    Scheduled Meds: . acetaminophen  650 mg Oral QID  . amLODipine  10 mg Oral Daily  . aspirin  325 mg Oral Q breakfast  . docusate sodium  100 mg Oral BID  . enoxaparin (LOVENOX) injection  40 mg Subcutaneous Q24H  . hydrALAZINE  10 mg Oral TID  . midodrine  5 mg Oral TID WC  .  senna  2 tablet Oral BID  . [START ON 10/25/2017] Vitamin D (Ergocalciferol)  50,000 Units Oral Q Sun   Continuous Infusions:  Assessment/Plan:  1. Repeated syncope with episodes of bradycardia, flushing and sweating and abdominal discomfort and nausea.  When I saw the patient she was in bigeminy.  CT scan of the abdomen with oral contrast only showed a lobular right kidney and a small low attenuating area in the pancreas and diffuse coronary artery calcifications.  I asked neurology to see the patient because of one episode of urinating on herself with an episode of syncope.  EEG ordered.  Unclear why the patient is on midodrine and antihypertensive medications.  Patient was  not orthostatic today.  Try to get rid of midodrine and Norvasc.  24-hour urine for metanephrines and 5-HIAA.  Case discussed with cardiology to determine on whether or not cardiac catheterization needed. 2. Chronic kidney disease stage III with one kidney. 3. History of restless leg syndrome 4. History of knee replacement in December 2018  Code Status:     Code Status Orders  (From admission, onward)        Start     Ordered   10/19/17 1838  Full code  Continuous     10/19/17 1839    Code Status History    Date Active Date Inactive Code Status Order ID Comments User Context   07/06/2017 11:28 07/08/2017 14:54 Full Code [id number]  Lovell Sheehan, MD Inpatient     Disposition Plan: To be determined  Consultants:  Cardiology  Neurology  Time spent: 35 minutes  [place]

## 2017-10-20 NOTE — Consult Note (Signed)
Cardiology Consultation Note    Patient ID: Whitney [name], MRN: [id number], DOB/AGE: March 25, 1951 67 y.o. Admit date: 10/19/2017   Date of Consult: 10/20/2017 Primary Physician: Ronnell Freshwater, NP Primary Cardiologist:    Chief Complaint: syncope Reason for Consultation: syncope Requesting MD: Dr. Leslye Peer  HPI: Whitney [name] is a 67 y.o. female with history of substance abuse, chronic kidney disease s/p left nephrectomy, hypertension who was admitted with episodic episodes of nausea followed by bradycardia and dizziness.  Patient states these have been going on intermittently.  On each occasion, she has lower abdominal discomfort followed by nausea diaphoresis and near syncope or frank syncope.  In between these episodes she has been doing well.  She had been on carvedilol for PVCs.  She had discontinued this but resumed it 1 week ago.  She denies chest pain.  She denies orthopnea or PND.  She is on amlodipine, hydralazine as well as the carvedilol for hypertension.  She is status post left nephrectomy.  Cardiac markers are normal.  Electrocardiogram shows sinus rhythm.  During the episodes of bradycardia, there are no dropped beats or pauses that I can document.  Past Medical History:  Diagnosis Date  . Abnormal Pap smear of cervix    Pt states she had colposcopy   . Arthritis   . Chronic kidney disease   . Hx of unilateral nephrectomy 1979   Left Nephrectomy  . Hypertension   . PMB (postmenopausal bleeding)   . Restless leg syndrome   . Vitamin D deficiency       Surgical History:  Past Surgical History:  Procedure Laterality Date  . HYSTEROSCOPY W/D&C N/A 02/16/2017   Procedure: DILATATION AND CURETTAGE /HYSTEROSCOPY;  Surgeon: Defrancesco, Alanda Slim, MD;  Location: [facility] ORS;  Service: Gynecology;  Laterality: N/A;  . KIDNEY SURGERY Left 1979   left kidney removed  . TOTAL KNEE ARTHROPLASTY Left 07/06/2017   Procedure: TOTAL KNEE ARTHROPLASTY;  Surgeon: Lovell Sheehan, MD;   Location: [facility] ORS;  Service: Orthopedics;  Laterality: Left;  . WRIST ARTHROSCOPY Left 1970s     Home Meds: Prior to Admission medications   Medication Sig Start Date End Date Taking? Authorizing Provider  acetaminophen (TYLENOL) 325 MG tablet Take 650 mg by mouth 4 (four) times daily as needed.    Yes [provider]  amLODipine (NORVASC) 10 MG tablet Take 10 mg by mouth daily.    Yes [provider]  carvedilol (COREG) 3.125 MG tablet Take 1 tablet (3.125 mg total) by mouth 2 (two) times daily with a meal. 10/06/17  Yes Boscia, Heather E, NP  famotidine (PEPCID) 20 MG tablet Take 20 mg by mouth daily as needed.    Yes [provider]  gabapentin (NEURONTIN) 300 MG capsule Take 300 mg by mouth 3 (three) times daily.  06/04/17  Yes [provider]  hydrALAZINE (APRESOLINE) 25 MG tablet Take 25 mg by mouth 3 (three) times daily.   Yes [provider]  lip balm (BLISTEX) OINT Apply liberal amount to cold sores as needed for pain/irritation. Ok to leave at bedside for pt to apply   Yes [provider]  methocarbamol (ROBAXIN) 500 MG tablet Take 500 mg by mouth 3 (three) times daily as needed.    Yes [provider]  rOPINIRole (REQUIP) 0.5 MG tablet Take 0.5 mg at bedtime by mouth. Takes 1 or 2 tablets as needed   Yes [provider]  traMADol (ULTRAM) 50 MG tablet Take 1 tablet (  50 mg total) by mouth every 12 (twelve) hours as needed. 10/06/17  Yes Ronnell Freshwater, NP  aspirin EC 325 MG EC tablet Take 1 tablet (325 mg total) by mouth daily with breakfast. Patient not taking: Reported on 10/19/2017 07/09/17   Lovell Sheehan, MD  docusate sodium (COLACE) 100 MG capsule Take 1 capsule (100 mg total) by mouth 2 (two) times daily. Patient not taking: Reported on 10/19/2017 07/08/17   Lovell Sheehan, MD  hydrALAZINE (APRESOLINE) 10 MG tablet Take 1 tablet (10 mg total) by mouth 3 (three) times daily. Hold for low blood pressure systolic  less than 270 mmHg, diastolic less than 70 mmHg Patient not taking: Reported on 10/19/2017 07/08/17   Lovell Sheehan, MD    Inpatient Medications:  . acetaminophen  650 mg Oral QID  . amLODipine  10 mg Oral Daily  . aspirin  325 mg Oral Q breakfast  . docusate sodium  100 mg Oral BID  . enoxaparin (LOVENOX) injection  40 mg Subcutaneous Q24H  . hydrALAZINE  10 mg Oral TID  . senna  2 tablet Oral BID  . [START ON 10/25/2017] Vitamin D (Ergocalciferol)  50,000 Units Oral Q Sun     Allergies:  Allergies  Allergen Reactions  . Enalapril Swelling  . Lisinopril Swelling    Social History   Socioeconomic History  . Marital status: Divorced    Spouse name: Not on file  . Number of children: Not on file  . Years of education: Not on file  . Highest education level: Not on file  Social Needs  . Financial resource strain: Not on file  . Food insecurity - worry: Not on file  . Food insecurity - inability: Not on file  . Transportation needs - medical: Not on file  . Transportation needs - non-medical: Not on file  Occupational History  . Not on file  Tobacco Use  . Smoking status: Never Smoker  . Smokeless tobacco: Never Used  Substance and Sexual Activity  . Alcohol use: Yes    Comment: occasionally  . Drug use: Yes    Frequency: 3.0 times per week    Types: Marijuana, Cocaine    Comment: Pt states she uses for leg pain: marijuana; states no longer uses cocaine  . Sexual activity: Yes    Birth control/protection: None  Other Topics Concern  . Not on file  Social History Narrative  . Not on file     Family History  Problem Relation Age of Onset  . Ovarian cancer Mother   . Hypertension Father   . Diabetes Father   . Cancer Father   . Breast cancer Sister   . Diabetes Sister      Review of Systems: A 12-system review of systems was performed and is negative except as noted in the HPI.  Labs: Recent Labs    10/19/17 1700  TROPONINI <0.03   Lab Results   Component Value Date   WBC 5.8 10/20/2017   HGB 12.9 10/20/2017   HCT 40.1 10/20/2017   MCV 87.0 10/20/2017   PLT 285 10/20/2017    Recent Labs  Lab 10/20/17 0452  NA 135  K 4.2  CL 103  CO2 24  BUN 18  CREATININE 1.10*  CALCIUM 8.0*  GLUCOSE 89   No results found for: CHOL, HDL, LDLCALC, TRIG No results found for: DDIMER  Radiology/Studies:  Dg Chest Portable 1 View  Result Date: 10/19/2017 CLINICAL DATA:  Chest pain and  lightheadedness EXAM: PORTABLE CHEST 1 VIEW COMPARISON:  06/29/2017 FINDINGS: The heart size and mediastinal contours are within normal limits. Both lungs are clear. The visualized skeletal structures are unremarkable. IMPRESSION: No active disease. Electronically Signed   By: Inez Catalina M.D.   On: 10/19/2017 16:12    Wt Readings from Last 3 Encounters:  10/20/17 107.7 kg (237 lb 8 oz)  10/06/17 109.7 kg (241 lb 12.8 oz)  07/21/17 117 kg (258 lb)    EKG: Normal sinus rhythm  Physical Exam:  Blood pressure (!) 145/76, pulse 63, temperature 98.1 F (36.7 C), resp. rate 17, height 5\' 5"  (1.651 m), weight 107.7 kg (237 lb 8 oz), last menstrual period 01/26/2017, SpO2 100 %. Body mass index is 39.52 kg/m. General: Well developed, well nourished, in no acute distress. Head: Normocephalic, atraumatic, sclera non-icteric, no xanthomas, nares are without discharge.  Neck: Negative for carotid bruits. JVD not elevated. Lungs: Clear bilaterally to auscultation without wheezes, rales, or rhonchi. Breathing is unlabored. Heart: RRR with S1 S2. No murmurs, rubs, or gallops appreciated. Abdomen: Soft, non-tender, non-distended with normoactive bowel sounds. No hepatomegaly. No rebound/guarding. No obvious abdominal masses. Msk:  Strength and tone appear normal for age. Extremities: No clubbing or cyanosis. No edema.  Distal pedal pulses are 2+ and equal bilaterally. Neuro: Alert and oriented X 3. No facial asymmetry. No focal deficit. Moves all extremities  spontaneously. Psych:  Responds to questions appropriately with a normal affect.     Assessment and Plan  67 year old female with history of intermittent episodes of nausea followed by bradycardia diaphoresis and dizziness with occasional reported syncopal episodes.  These episodes only occur with abdominal discomfort.  They have features of vasovagal etiology.  No obvious cardiogenic etiology as the episodes of bradycardia occur only in the face of abdominal discomfort.  Guidelines would not suggest a permanent pacemaker in this situation given the vasovagal nature of her events. Data suggests that pacing can be indicated in patients with recurrent vasovagal mediated syncope with asystole for greater than 3 seconds with syncope or asystole greater than 6 seconds without syncope.  Patient thus far has had episodes of heart rates down in the 40s with her events.  Based on this, she does not appear to qualify for a pacemaker at this time.  Consideration for 30-day event monitoring or ILR therapy could be helpful.  Will discontinue carvedilol and continue with the remainder of her antihypertensive regimen.  Workup of the etiology of her abdominal discomfort may be helpful as well.   Signed, Teodoro Spray MD 10/20/2017, 7:56 AM Pager: [phone]

## 2017-10-20 NOTE — Care Management (Signed)
Patient goes to rehab for PT at outpatient [facility]. Recent knee surgery with Dr. Harlow Mares with [facility]. Walks with front-wheeled walker. Has 3-pronged cane and single cane also available. She was discharged from [facility] in Dec. 2018.  Her cousin lives with her; uses [employer] for transportation. PCP. Dr. Terance Hart with [facility]. She pays $1.20 each for medications which she is thankful for.  Patient will need PT here if she stays much longer- recent left knee replacement.

## 2017-10-20 NOTE — Care Management Obs Status (Signed)
[place] NOTIFICATION   Patient Details  Name: Whitney [name] MRN: [id number] Date of Birth: 1950/12/16   Medicare Observation Status Notification Given:       Marshell Garfinkel, RN 10/20/2017, 9:57 AM

## 2017-10-21 ENCOUNTER — Encounter: Payer: Self-pay | Admitting: *Deleted

## 2017-10-21 ENCOUNTER — Encounter
Admission: EM | Disposition: A | Discharge status: Home / Self Care | Payer: Self-pay | Source: Home / Self Care | Attending: Emergency Medicine

## 2017-10-21 ENCOUNTER — Ambulatory Visit: Payer: Medicare Other

## 2017-10-21 DIAGNOSIS — R079 Chest pain, unspecified: Secondary | ICD-10-CM | POA: Diagnosis not present

## 2017-10-21 DIAGNOSIS — R55 Syncope and collapse: Secondary | ICD-10-CM | POA: Diagnosis not present

## 2017-10-21 HISTORY — PX: LEFT HEART CATH AND CORONARY ANGIOGRAPHY: CATH118249

## 2017-10-21 LAB — PROTEIN, URINE, 24 HOUR
Collection Interval-UPROT: 24 hours
Protein, 24H Urine: 848 mg/d — ABNORMAL HIGH (ref 50–100)
Protein, Urine: 53 mg/dL
Urine Total Volume-UPROT: 1600 mL

## 2017-10-21 SURGERY — LEFT HEART CATH AND CORONARY ANGIOGRAPHY
Anesthesia: Moderate Sedation

## 2017-10-21 MED ORDER — SODIUM CHLORIDE 0.9 % IV SOLN: INTRAVENOUS | Status: DC

## 2017-10-21 MED ORDER — FLECAINIDE ACETATE 50 MG PO TABS
50.0000 mg | ORAL_TABLET | Freq: Two times a day (BID) | ORAL | Status: DC
  Administered 2017-10-21 – 2017-10-22 (×3): 50 mg via ORAL
  Filled 2017-10-21 (×3): qty 1

## 2017-10-21 MED ORDER — SODIUM CHLORIDE 0.9% FLUSH: 3.0000 mL | INTRAVENOUS | Status: DC | PRN

## 2017-10-21 MED ORDER — ACETAMINOPHEN 325 MG PO TABS: 650.0000 mg | ORAL_TABLET | ORAL | Status: DC | PRN

## 2017-10-21 MED ORDER — SODIUM CHLORIDE 0.9 % WEIGHT BASED INFUSION
3.0000 mL/kg/h | INTRAVENOUS | Status: AC
  Administered 2017-10-21: 3 mL/kg/h via INTRAVENOUS

## 2017-10-21 MED ORDER — ASPIRIN 81 MG PO CHEW
CHEWABLE_TABLET | ORAL | Status: AC
  Filled 2017-10-21: qty 1

## 2017-10-21 MED ORDER — FENTANYL CITRATE (PF) 100 MCG/2ML IJ SOLN
INTRAMUSCULAR | Status: DC | PRN
  Administered 2017-10-21: 25 ug via INTRAVENOUS

## 2017-10-21 MED ORDER — ATORVASTATIN CALCIUM 20 MG PO TABS
40.0000 mg | ORAL_TABLET | Freq: Every day | ORAL | Status: DC
  Administered 2017-10-21: 40 mg via ORAL
  Filled 2017-10-21: qty 2

## 2017-10-21 MED ORDER — MIDAZOLAM HCL 2 MG/2ML IJ SOLN
INTRAMUSCULAR | Status: DC | PRN
  Administered 2017-10-21: 1 mg via INTRAVENOUS

## 2017-10-21 MED ORDER — HEPARIN (PORCINE) IN NACL 2-0.9 UNIT/ML-% IJ SOLN
INTRAMUSCULAR | Status: AC
  Filled 2017-10-21: qty 500

## 2017-10-21 MED ORDER — SODIUM CHLORIDE 0.9 % IV SOLN: 250.0000 mL | INTRAVENOUS | Status: DC | PRN

## 2017-10-21 MED ORDER — IOPAMIDOL (ISOVUE-300) INJECTION 61%
INTRAVENOUS | Status: DC | PRN
  Administered 2017-10-21: 90 mL via INTRA_ARTERIAL

## 2017-10-21 MED ORDER — SODIUM CHLORIDE 0.9 % WEIGHT BASED INFUSION
1.0000 mL/kg/h | INTRAVENOUS | Status: AC
  Administered 2017-10-21 (×2): 1 mL/kg/h via INTRAVENOUS

## 2017-10-21 MED ORDER — SODIUM CHLORIDE 0.9 % IV SOLN
250.0000 mL | INTRAVENOUS | Status: DC | PRN
Start: 2017-10-21 — End: 2017-10-22

## 2017-10-21 MED ORDER — FENTANYL CITRATE (PF) 100 MCG/2ML IJ SOLN
INTRAMUSCULAR | Status: AC
  Filled 2017-10-21: qty 2

## 2017-10-21 MED ORDER — MIDAZOLAM HCL 2 MG/2ML IJ SOLN
INTRAMUSCULAR | Status: AC
  Filled 2017-10-21: qty 2

## 2017-10-21 MED ORDER — SODIUM CHLORIDE 0.9% FLUSH: 3.0000 mL | Freq: Two times a day (BID) | INTRAVENOUS | Status: DC

## 2017-10-21 MED ORDER — SODIUM CHLORIDE 0.9% FLUSH
3.0000 mL | Freq: Two times a day (BID) | INTRAVENOUS | Status: DC
  Administered 2017-10-21: 3 mL via INTRAVENOUS

## 2017-10-21 MED ORDER — CARVEDILOL 3.125 MG PO TABS
3.1250 mg | ORAL_TABLET | Freq: Two times a day (BID) | ORAL | Status: DC
  Administered 2017-10-21 – 2017-10-22 (×2): 3.125 mg via ORAL
  Filled 2017-10-21 (×2): qty 1

## 2017-10-21 MED ORDER — SODIUM CHLORIDE 0.9 % WEIGHT BASED INFUSION: 1.0000 mL/kg/h | INTRAVENOUS | Status: DC

## 2017-10-21 MED ORDER — GABAPENTIN 100 MG PO CAPS
100.0000 mg | ORAL_CAPSULE | Freq: Two times a day (BID) | ORAL | Status: DC
  Administered 2017-10-21 – 2017-10-22 (×2): 100 mg via ORAL
  Filled 2017-10-21 (×2): qty 1

## 2017-10-21 MED ORDER — ACETYLCYSTEINE 20 % IN SOLN
600.0000 mg | Freq: Two times a day (BID) | RESPIRATORY_TRACT | Status: DC
  Administered 2017-10-21 – 2017-10-22 (×2): 600 mg via ORAL
  Filled 2017-10-21 (×3): qty 4

## 2017-10-21 MED ORDER — ASPIRIN 81 MG PO CHEW
81.0000 mg | CHEWABLE_TABLET | ORAL | Status: AC
  Administered 2017-10-21: 81 mg via ORAL

## 2017-10-21 MED ORDER — ONDANSETRON HCL 4 MG/2ML IJ SOLN: 4.0000 mg | Freq: Four times a day (QID) | INTRAMUSCULAR | Status: DC | PRN

## 2017-10-21 SURGICAL SUPPLY — 9 items
CATH INFINITI 5FR ANG PIGTAIL (CATHETERS) ×2 IMPLANT
CATH INFINITI 5FR JL4 (CATHETERS) ×2 IMPLANT
CATH INFINITI JR4 5F (CATHETERS) ×2 IMPLANT
DEVICE CLOSURE MYNXGRIP 5F (Vascular Products) ×2 IMPLANT
KIT MANI 3VAL PERCEP (MISCELLANEOUS) ×2 IMPLANT
NEEDLE PERC 18GX7CM (NEEDLE) ×2 IMPLANT
PACK CARDIAC CATH (CUSTOM PROCEDURE TRAY) ×2 IMPLANT
SHEATH AVANTI 5FR X 11CM (SHEATH) ×2 IMPLANT
WIRE GUIDERIGHT .035X150 (WIRE) ×2 IMPLANT

## 2017-10-21 NOTE — Progress Notes (Signed)
Patient Name: Whitney [name] Date of Encounter: 10/21/2017  Hospital Problem List     Active Problems:   Symptomatic bradycardia   Near syncope    Patient Profile     68 yo with no exertional chest pain and normal stress sestamibi in 11/18 with intermittent ventricular bigeminy and transient vagal syncope  Subjective   No further events   Inpatient Medications    . acetaminophen  650 mg Oral QID  . acetylcysteine  600 mg Oral BID  . aspirin      . aspirin  325 mg Oral Q breakfast  . docusate sodium  100 mg Oral BID  . enoxaparin (LOVENOX) injection  40 mg Subcutaneous Q24H  . hydrALAZINE  10 mg Oral TID  . senna  2 tablet Oral BID  . sodium chloride flush  3 mL Intravenous Q12H  . sodium chloride flush  3 mL Intravenous Q12H  . [START ON 10/25/2017] Vitamin D (Ergocalciferol)  50,000 Units Oral Q Sun    Vital Signs    Vitals:   10/21/17 0455 10/21/17 0500 10/21/17 0733 10/21/17 0819  BP: 137/85 132/69 (!) 155/60   Pulse: (!) 50 (!) 52 95   Resp:   (!) 21   Temp:   97.6 F (36.4 C)   TempSrc:   Oral   SpO2: 99% 100% 95% 96%  Weight: 108.9 kg (240 lb 1.6 oz)  108.9 kg (240 lb)   Height:   5\' 5"  (1.651 m)     Intake/Output Summary (Last 24 hours) at 10/21/2017 0903 Last data filed at 10/20/2017 2300 Gross per 24 hour  Intake -  Output 500 ml  Net -500 ml   Filed Weights   10/20/17 0345 10/21/17 0455 10/21/17 0733  Weight: 107.7 kg (237 lb 8 oz) 108.9 kg (240 lb 1.6 oz) 108.9 kg (240 lb)    Physical Exam    GEN: Well nourished, well developed, in no acute distress.  HEENT: normal.  Neck: Supple, no JVD, carotid bruits, or masses. Cardiac: RRR, no murmurs, rubs, or gallops. No clubbing, cyanosis, edema.  Radials/DP/PT 2+ and equal bilaterally.  Respiratory:  Respirations regular and unlabored, clear to auscultation bilaterally. GI: Soft, nontender, nondistended, BS + x 4. MS: no deformity or atrophy. Skin: warm and dry, no rash. Neuro:  Strength and  sensation are intact. Psych: Normal affect.  Labs    CBC Recent Labs    10/19/17 1751 10/20/17 0452  WBC 6.3 5.8  HGB 13.3 12.9  HCT 41.4 40.1  MCV 87.7 87.0  PLT 310 595   Basic Metabolic Panel Recent Labs    10/19/17 1700 10/20/17 0452  NA 135 135  K 4.6 4.2  CL 102 103  CO2 24 24  GLUCOSE 87 89  BUN 19 18  CREATININE 1.12* 1.10*  CALCIUM 8.3* 8.0*   Liver Function Tests No results for input(s): AST, ALT, ALKPHOS, BILITOT, PROT, ALBUMIN in the last 72 hours. No results for input(s): LIPASE, AMYLASE in the last 72 hours. Cardiac Enzymes Recent Labs    10/19/17 1700  TROPONINI <0.03   BNP No results for input(s): BNP in the last 72 hours. D-Dimer No results for input(s): DDIMER in the last 72 hours. Hemoglobin A1C No results for input(s): HGBA1C in the last 72 hours. Fasting Lipid Panel No results for input(s): CHOL, HDL, LDLCALC, TRIG, CHOLHDL, LDLDIRECT in the last 72 hours. Thyroid Function Tests No results for input(s): TSH, T4TOTAL, T3FREE, THYROIDAB in the last 72 hours.  Invalid input(s): FREET3  Telemetry    nsr with intermittent ventricular bigeminy  ECG    nsr  Radiology    Ct Abdomen Pelvis Wo Contrast  Result Date: 10/20/2017 CLINICAL DATA:  Abdominal distention, some nausea, dizziness, bradycardia EXAM: CT ABDOMEN AND PELVIS WITHOUT CONTRAST TECHNIQUE: Multidetector CT imaging of the abdomen and pelvis was performed following the standard protocol without IV contrast. COMPARISON:  None. FINDINGS: Lower chest: The lung bases are clear. The heart is moderately enlarged. No pericardial effusion is seen. Coronary artery calcifications are present primarily in the distribution of the left anterior descending and circumflex coronary arteries Hepatobiliary: The liver enhances and there are several low-attenuation structures at least 1 of which appears to represent a cyst. The other is more medially position and could represent hemangioma but  assessment by ultrasound or preferably MRI may be helpful. No ductal dilatation is seen. A faint hepatic granuloma is present in the right lobe. No calcified gallstones are seen. Pancreas: The pancreas is fairly well visualized. There is an area of low-attenuation on image 28 series 2. However on sagittal and coronal images this may represent a ridging through a lobulation. This area could also be assessed by ultrasound or preferably MRI. No pancreatic ductal dilatation is seen. Spleen: The spleen is unremarkable. Adrenals/Urinary Tract: The adrenal glands appear normal. The left kidney has previously been resected the right kidney has lobular contours there is a slightly lower attenuation area emanating from the lower pole of the right kidney and ultrasound or MRI could also assess this area. No hydronephrosis is seen. There is a single focus of renovascular calcification present. The right ureter is normal in caliber. The urinary bladder is not optimally distended but no abnormality is seen. Stomach/Bowel: The stomach is moderately distended with food debris and oral contrast. No abnormality is seen. No small bowel dilatation is evident and there is no evidence of edema of the mucosa. Multiple rectosigmoid colon diverticula are present with diverticula also throughout the descending colon. Scattered more proximal colonic diverticula also are noted. No diverticulitis is seen. The terminal ileum and the appendix are unremarkable. Vascular/Lymphatic: Moderate abdominal aortic atherosclerosis is present. No adenopathy is seen. Reproductive: The uterus is somewhat lobular in contour which may indicate the presence of small fibroids, 1 of which may be pedunculated on the left. No adnexal lesion is seen. Other: A small umbilical hernia is present containing fat. There is also a lipoma within the musculature of the proximal anterior right thigh. Musculoskeletal: The lumbar vertebrae are in normal alignment with diffuse  degenerative disc disease present. No compression deformity is seen. IMPRESSION: 1. No definite explanation for the patient's symptoms is seen. 2. Small low-attenuation area within the body of the pancreas may be volume averaging but MRI or ultrasound may be helpful to assess further. 3. Lobular right kidney with questionable low-attenuation area in the lower pole. Again MRI or ultrasound may be helpful to assess further. No hydronephrosis. Prior left nephrectomy. 4. Suspect small uterine fibroids. 5. Moderate abdominal aortic atherosclerosis and diffuse coronary artery calcifications. 6. Multiple colonic diverticula primarily at within the rectosigmoid and descending colon. Electronically Signed   By: Ivar Drape M.D.   On: 10/20/2017 12:25   Mr Brain Wo Contrast  Result Date: 10/20/2017 CLINICAL DATA:  Syncope and nausea. EXAM: MRI HEAD WITHOUT CONTRAST TECHNIQUE: Multiplanar, multiecho pulse sequences of the brain and surrounding structures were obtained without intravenous contrast. COMPARISON:  Brain MRI 04/18/2016 FINDINGS: Brain: The midline structures are normal. There  is no acute infarct or acute hemorrhage. No mass lesion, hydrocephalus, dural abnormality or extra-axial collection. Multifocal white matter hyperintensity, most commonly due to chronic ischemic microangiopathy. No age-advanced or lobar predominant atrophy. No chronic microhemorrhage or superficial siderosis. Vascular: Major intracranial arterial and venous sinus flow voids are preserved. Skull and upper cervical spine: The visualized skull base, calvarium, upper cervical spine and extracranial soft tissues are normal. Sinuses/Orbits: No fluid levels or advanced mucosal thickening. No mastoid or middle ear effusion. Normal orbits. IMPRESSION: Chronic microvascular disease without acute intracranial abnormality. Electronically Signed   By: Ulyses Jarred M.D.   On: 10/20/2017 17:44   Dg Chest Portable 1 View  Result Date:  10/19/2017 CLINICAL DATA:  Chest pain and lightheadedness EXAM: PORTABLE CHEST 1 VIEW COMPARISON:  06/29/2017 FINDINGS: The heart size and mediastinal contours are within normal limits. Both lungs are clear. The visualized skeletal structures are unremarkable. IMPRESSION: No active disease. Electronically Signed   By: Inez Catalina M.D.   On: 10/19/2017 16:12    Assessment & Plan    Syncope-appears vasovagal. Has intermittent ventricular bigeminy which may play a role as well. Does not appear to have infranodal block. WIll use carvedilol to attempt to improve bigeminy. Does not appear to require ppm at present. Outpatient holter.   diapharesis-cardiac cath done today revealed normal lm, 50% lad, normal lcx, 60% mid to distal rca. Preserved lv funciton. No significant disease. Risk factor modification and medical therapy to include carvedilol, asa, and high intensity statin.   Signed, Javier Docker Zyiah Withington MD 10/21/2017, 9:03 AM  Pager: [phone]

## 2017-10-21 NOTE — Progress Notes (Signed)
Patient ID: Whitney [name], female   DOB: [birth year], 67 y.o.   MRN: [id number]  [facility] PROGRESS NOTE  Whitney [name] ZDG:[id number] DOB: [birth year] DOA: 10/19/2017 PCP: Ronnell Freshwater, NP  HPI/Subjective: Patient seen after cardiac catheterization.  Patient does have coronary artery disease but medical management recommended.  Patient still having bigeminy on the monitor.  Objective: Vitals:   10/21/17 1041 10/21/17 1058  BP: (!) 123/111 (!) 155/94  Pulse:  72  Resp:    Temp:    SpO2:      Filed Weights   10/20/17 0345 10/21/17 0455 10/21/17 0733  Weight: 107.7 kg (237 lb 8 oz) 108.9 kg (240 lb 1.6 oz) 108.9 kg (240 lb)    ROS: Review of Systems  Constitutional: Negative for chills and fever.  Eyes: Negative for blurred vision.  Respiratory: Negative for cough and shortness of breath.   Cardiovascular: Negative for chest pain.  Gastrointestinal: Positive for nausea. Negative for abdominal pain, constipation, diarrhea and vomiting.  Genitourinary: Negative for dysuria.  Musculoskeletal: Negative for joint pain.  Neurological: Negative for dizziness and headaches.   Exam: Physical Exam  Constitutional: She is oriented to person, place, and time.  HENT:  Nose: No mucosal edema.  Mouth/Throat: No oropharyngeal exudate or posterior oropharyngeal edema.  Eyes: Conjunctivae, EOM and lids are normal. Pupils are equal, round, and reactive to light.  Neck: No JVD present. Carotid bruit is not present. No edema present. No thyroid mass and no thyromegaly present.  Cardiovascular: S1 normal and S2 normal. Exam reveals no gallop.  No murmur heard. Pulses:      Dorsalis pedis pulses are 2+ on the right side, and 2+ on the left side.  Respiratory: No respiratory distress. She has no wheezes. She has no rhonchi. She has no rales.  GI: Soft. Bowel sounds are normal. There is no tenderness.  Musculoskeletal:       Right ankle: She exhibits swelling.       Left ankle: She  exhibits swelling.  Lymphadenopathy:    She has no cervical adenopathy.  Neurological: She is alert and oriented to person, place, and time. No cranial nerve deficit.  Skin: Skin is warm. No rash noted. Nails show no clubbing.  Psychiatric: She has a normal mood and affect.      Data Reviewed: Basic Metabolic Panel: Recent Labs  Lab 10/19/17 1700 10/20/17 0452  NA 135 135  K 4.6 4.2  CL 102 103  CO2 24 24  GLUCOSE 87 89  BUN 19 18  CREATININE 1.12* 1.10*  CALCIUM 8.3* 8.0*   CBC: Recent Labs  Lab 10/19/17 1751 10/20/17 0452  WBC 6.3 5.8  HGB 13.3 12.9  HCT 41.4 40.1  MCV 87.7 87.0  PLT 310 285   Cardiac Enzymes: Recent Labs  Lab 10/19/17 1700  TROPONINI <0.03    CBG: Recent Labs  Lab 10/20/17 0723  GLUCAP 83     Studies: Ct Abdomen Pelvis Wo Contrast  Result Date: 10/20/2017 CLINICAL DATA:  Abdominal distention, some nausea, dizziness, bradycardia EXAM: CT ABDOMEN AND PELVIS WITHOUT CONTRAST TECHNIQUE: Multidetector CT imaging of the abdomen and pelvis was performed following the standard protocol without IV contrast. COMPARISON:  None. FINDINGS: Lower chest: The lung bases are clear. The heart is moderately enlarged. No pericardial effusion is seen. Coronary artery calcifications are present primarily in the distribution of the left anterior descending and circumflex coronary arteries Hepatobiliary: The liver enhances and there are several low-attenuation structures at least 1 of which  appears to represent a cyst. The other is more medially position and could represent hemangioma but assessment by ultrasound or preferably MRI may be helpful. No ductal dilatation is seen. A faint hepatic granuloma is present in the right lobe. No calcified gallstones are seen. Pancreas: The pancreas is fairly well visualized. There is an area of low-attenuation on image 28 series 2. However on sagittal and coronal images this may represent a ridging through a lobulation. This  area could also be assessed by ultrasound or preferably MRI. No pancreatic ductal dilatation is seen. Spleen: The spleen is unremarkable. Adrenals/Urinary Tract: The adrenal glands appear normal. The left kidney has previously been resected the right kidney has lobular contours there is a slightly lower attenuation area emanating from the lower pole of the right kidney and ultrasound or MRI could also assess this area. No hydronephrosis is seen. There is a single focus of renovascular calcification present. The right ureter is normal in caliber. The urinary bladder is not optimally distended but no abnormality is seen. Stomach/Bowel: The stomach is moderately distended with food debris and oral contrast. No abnormality is seen. No small bowel dilatation is evident and there is no evidence of edema of the mucosa. Multiple rectosigmoid colon diverticula are present with diverticula also throughout the descending colon. Scattered more proximal colonic diverticula also are noted. No diverticulitis is seen. The terminal ileum and the appendix are unremarkable. Vascular/Lymphatic: Moderate abdominal aortic atherosclerosis is present. No adenopathy is seen. Reproductive: The uterus is somewhat lobular in contour which may indicate the presence of small fibroids, 1 of which may be pedunculated on the left. No adnexal lesion is seen. Other: A small umbilical hernia is present containing fat. There is also a lipoma within the musculature of the proximal anterior right thigh. Musculoskeletal: The lumbar vertebrae are in normal alignment with diffuse degenerative disc disease present. No compression deformity is seen. IMPRESSION: 1. No definite explanation for the patient's symptoms is seen. 2. Small low-attenuation area within the body of the pancreas may be volume averaging but MRI or ultrasound may be helpful to assess further. 3. Lobular right kidney with questionable low-attenuation area in the lower pole. Again MRI or  ultrasound may be helpful to assess further. No hydronephrosis. Prior left nephrectomy. 4. Suspect small uterine fibroids. 5. Moderate abdominal aortic atherosclerosis and diffuse coronary artery calcifications. 6. Multiple colonic diverticula primarily at within the rectosigmoid and descending colon. Electronically Signed   By: Ivar Drape M.D.   On: 10/20/2017 12:25   Mr Brain Wo Contrast  Result Date: 10/20/2017 CLINICAL DATA:  Syncope and nausea. EXAM: MRI HEAD WITHOUT CONTRAST TECHNIQUE: Multiplanar, multiecho pulse sequences of the brain and surrounding structures were obtained without intravenous contrast. COMPARISON:  Brain MRI 04/18/2016 FINDINGS: Brain: The midline structures are normal. There is no acute infarct or acute hemorrhage. No mass lesion, hydrocephalus, dural abnormality or extra-axial collection. Multifocal white matter hyperintensity, most commonly due to chronic ischemic microangiopathy. No age-advanced or lobar predominant atrophy. No chronic microhemorrhage or superficial siderosis. Vascular: Major intracranial arterial and venous sinus flow voids are preserved. Skull and upper cervical spine: The visualized skull base, calvarium, upper cervical spine and extracranial soft tissues are normal. Sinuses/Orbits: No fluid levels or advanced mucosal thickening. No mastoid or middle ear effusion. Normal orbits. IMPRESSION: Chronic microvascular disease without acute intracranial abnormality. Electronically Signed   By: Ulyses Jarred M.D.   On: 10/20/2017 17:44   Dg Chest Portable 1 View  Result Date: 10/19/2017 CLINICAL DATA:  Chest pain and lightheadedness EXAM: PORTABLE CHEST 1 VIEW COMPARISON:  06/29/2017 FINDINGS: The heart size and mediastinal contours are within normal limits. Both lungs are clear. The visualized skeletal structures are unremarkable. IMPRESSION: No active disease. Electronically Signed   By: Inez Catalina M.D.   On: 10/19/2017 16:12    Scheduled Meds: .  acetaminophen  650 mg Oral QID  . acetylcysteine  600 mg Oral BID  . aspirin      . aspirin  325 mg Oral Q breakfast  . atorvastatin  40 mg Oral q1800  . carvedilol  3.125 mg Oral BID WC  . docusate sodium  100 mg Oral BID  . enoxaparin (LOVENOX) injection  40 mg Subcutaneous Q24H  . flecainide  50 mg Oral Q12H  . gabapentin  100 mg Oral BID  . hydrALAZINE  10 mg Oral TID  . senna  2 tablet Oral BID  . sodium chloride flush  3 mL Intravenous Q12H  . sodium chloride flush  3 mL Intravenous Q12H  . sodium chloride flush  3 mL Intravenous Q12H  . [START ON 10/25/2017] Vitamin D (Ergocalciferol)  50,000 Units Oral Q Sun   Continuous Infusions: . sodium chloride    . sodium chloride    . [START ON 10/22/2017] sodium chloride 3 mL/kg/hr (10/21/17 0932)   Followed by  . [START ON 10/22/2017] sodium chloride    . sodium chloride 1 mL/kg/hr (10/21/17 1317)    Assessment/Plan:  1.  Repeated syncope with episodes of flushing and sweating and abdominal discomfort and nausea.  Bigeminy on monitor.  Cardiac catheterization showing coronary artery disease but medical management recommended.  Restart low-dose Coreg.  Continue aspirin.  Start atorvastatin.  Neurological workup negative including EEG and MRI of the brain.  Cardiology started flecainide.  The patient does not have bradycardia because the patient is in bigeminy and the monitors are picking up only half the heartbeats. 2.  Chronic kidney disease stage III with one kidney.  Since the patient had a cardiac cath today I will hydrate overnight and check a BMP in the morning. 3.  Essential hypertension on Coreg and hydralazine  4.  Neuropathy restart low-dose gabapentin  5.  history of restless leg syndrome-as needed Requip 6.  History of knee replacement in December 2018 7.  advised to get rid of muscle relaxer, tramadol.   Code Status:     Code Status Orders  (From admission, onward)        Start     Ordered   10/19/17 1838  Full  code  Continuous     10/19/17 1839    Code Status History    Date Active Date Inactive Code Status Order ID Comments User Context   07/06/2017 11:28 07/08/2017 14:54 Full Code [id number]  Lovell Sheehan, MD Inpatient     Disposition Plan: To be determined  Consultants:  Cardiology  Neurology  Time spent:  28 minutes, case discussed with Dr. Ubaldo Glassing cardiology  Delfino Lovett [facility]

## 2017-10-21 NOTE — Plan of Care (Signed)
  Progressing Clinical Measurements: Diagnostic test results will improve 10/21/2017 0303 - Progressing by Loran Senters, RN Education: Understanding of CV disease, CV risk reduction, and recovery process will improve 10/21/2017 0303 - Progressing by Loran Senters, RN

## 2017-10-21 NOTE — Plan of Care (Signed)
  Progressing Clinical Measurements: Ability to maintain clinical measurements within normal limits will improve 10/21/2017 1437 - Progressing by Alen Blew, RN Cardiovascular complication will be avoided 10/21/2017 1437 - Progressing by Alen Blew, RN Activity: Ability to return to baseline activity level will improve 10/21/2017 1437 - Progressing by Alen Blew, RN Cardiovascular: Ability to achieve and maintain adequate cardiovascular perfusion will improve 10/21/2017 1437 - Progressing by Alen Blew, RN Vascular access site(s) Level 0-1 will be maintained 10/21/2017 1437 - Progressing by Alen Blew, RN

## 2017-10-22 ENCOUNTER — Other Ambulatory Visit: Payer: Medicare Other

## 2017-10-22 DIAGNOSIS — R079 Chest pain, unspecified: Secondary | ICD-10-CM | POA: Diagnosis not present

## 2017-10-22 DIAGNOSIS — R55 Syncope and collapse: Secondary | ICD-10-CM | POA: Diagnosis not present

## 2017-10-22 LAB — BASIC METABOLIC PANEL
Anion gap: 8 (ref 5–15)
BUN: 33 mg/dL — ABNORMAL HIGH (ref 6–20)
CO2: 25 mmol/L (ref 22–32)
Calcium: 8.4 mg/dL — ABNORMAL LOW (ref 8.9–10.3)
Chloride: 108 mmol/L (ref 101–111)
Creatinine, Ser: 1.3 mg/dL — ABNORMAL HIGH (ref 0.44–1.00)
GFR calc Af Amer: 48 mL/min — ABNORMAL LOW (ref >60)
GFR calc non Af Amer: 41 mL/min — ABNORMAL LOW (ref >60)
Glucose, Bld: 96 mg/dL (ref 65–99)
Potassium: 4.6 mmol/L (ref 3.5–5.1)
Sodium: 141 mmol/L (ref 135–145)

## 2017-10-22 LAB — METANEPHRINES, PLASMA
Metanephrine, Free: 36 pg/mL (ref 0–62)
Normetanephrine, Free: 152 pg/mL — ABNORMAL HIGH (ref 0–145)

## 2017-10-22 MED ORDER — HYDRALAZINE HCL 10 MG PO TABS: 10.0000 mg | ORAL_TABLET | Freq: Three times a day (TID) | ORAL | 0 refills | Status: DC

## 2017-10-22 MED ORDER — ASPIRIN EC 81 MG PO TBEC: 81.0000 mg | DELAYED_RELEASE_TABLET | Freq: Every day | ORAL | 0 refills | Status: AC

## 2017-10-22 MED ORDER — ATORVASTATIN CALCIUM 40 MG PO TABS: 40.0000 mg | ORAL_TABLET | Freq: Every day | ORAL | 0 refills | Status: DC

## 2017-10-22 MED ORDER — FLECAINIDE ACETATE 50 MG PO TABS: 50.0000 mg | ORAL_TABLET | Freq: Two times a day (BID) | ORAL | 0 refills | Status: DC

## 2017-10-22 MED ORDER — TRAZODONE HCL 50 MG PO TABS: 50.0000 mg | ORAL_TABLET | Freq: Every evening | ORAL | 0 refills | Status: DC | PRN

## 2017-10-22 MED ORDER — GABAPENTIN 100 MG PO CAPS: 100.0000 mg | ORAL_CAPSULE | Freq: Two times a day (BID) | ORAL | 0 refills | Status: DC

## 2017-10-22 NOTE — Discharge Summary (Signed)
[facility] [place] NAME: Whitney [name]    MR#:  [id number]  DATE OF BIRTH:  12/22/1950  DATE OF ADMISSION:  10/19/2017 ADMITTING PHYSICIAN: Epifanio Lesches, MD  DATE OF DISCHARGE: 10/22/2017 12:31 PM  PRIMARY CARE PHYSICIAN: Ronnell Freshwater, NP    ADMISSION DIAGNOSIS:  Symptomatic bradycardia [R00.1] Near syncope [R55]  DISCHARGE DIAGNOSIS:  Syncope Bigeminy Nonobstructive coronary artery disease  SECONDARY DIAGNOSIS:   Past Medical History:  Diagnosis Date  . Abnormal Pap smear of cervix    Pt states she had colposcopy   . Arthritis   . Chronic kidney disease   . Hx of unilateral nephrectomy 1979   Left Nephrectomy  . Hypertension   . PMB (postmenopausal bleeding)   . Restless leg syndrome   . Vitamin D deficiency     HOSPITAL COURSE:   1.  Repeated syncope with episodes of flushing and sweating and abdominal discomfort and nausea.  The patient did have bigeminy on the monitor.  In order to monitor the heart rate accurately we needed to listen or use telemetry monitoring.  The blood pressure cuff and checking her pulse radially gave full dose readings of bradycardia.  24-hour urine for 5 HIAA pending.  Serum metanephrines pending.  Patient was placed back on Coreg and cardiology started flecainide.  Patient was then in normal sinus rhythm upon discharge home and feeling better.  Neurological workup including EEG and MRI of the brain were negative. 2.  Nonobstructive coronary artery disease.  Patient on aspirin, statin and Coreg 3.  Chronic kidney disease stage III with one kidney.  Patient was hydrated overnight.  Creatinine 1.3 upon discharge.  Advised hydration.  She was given Mucomyst here in the hospital.  And follow-up appointment I would check a BMP. 4.  Essential hypertension on Coreg and hydralazine 5.  Neuropathy restart low-dose gabapentin.  Some problems can occur with her with higher dose medications. 6.   History of restless leg syndrome as needed Requip 7.  Advised to get rid of muscle relaxer and tramadol 8.  Patient does outpatient physical therapy.  DISCHARGE CONDITIONS:   Satisfactory  CONSULTS OBTAINED:  Treatment Team:  Teodoro Spray, MD Alexis Goodell, MD  DRUG ALLERGIES:   Allergies  Allergen Reactions  . Enalapril Swelling  . Lisinopril Swelling    DISCHARGE MEDICATIONS:   Allergies as of 10/22/2017      Reactions   Enalapril Swelling   Lisinopril Swelling      Medication List    STOP taking these medications   amLODipine 10 MG tablet Commonly known as:  NORVASC   docusate sodium 100 MG capsule Commonly known as:  COLACE   famotidine 20 MG tablet Commonly known as:  PEPCID   lip balm Oint   methocarbamol 500 MG tablet Commonly known as:  ROBAXIN   traMADol 50 MG tablet Commonly known as:  ULTRAM     TAKE these medications   acetaminophen 325 MG tablet Commonly known as:  TYLENOL Take 650 mg by mouth 4 (four) times daily as needed.   aspirin EC 81 MG tablet Take 1 tablet (81 mg total) by mouth daily. What changed:    medication strength  how much to take  when to take this   atorvastatin 40 MG tablet Commonly known as:  LIPITOR Take 1 tablet (40 mg total) by mouth daily at 6 PM.   carvedilol 3.125 MG tablet Commonly known as:  COREG Take 1 tablet (  3.125 mg total) by mouth 2 (two) times daily with a meal.   flecainide 50 MG tablet Commonly known as:  TAMBOCOR Take 1 tablet (50 mg total) by mouth every 12 (twelve) hours.   gabapentin 100 MG capsule Commonly known as:  NEURONTIN Take 1 capsule (100 mg total) by mouth 2 (two) times daily. What changed:    medication strength  how much to take  when to take this   hydrALAZINE 10 MG tablet Commonly known as:  APRESOLINE Take 1 tablet (10 mg total) by mouth 3 (three) times daily. What changed:    additional instructions  Another medication with the same name was  removed. Continue taking this medication, and follow the directions you see here.   rOPINIRole 0.5 MG tablet Commonly known as:  REQUIP Take 0.5 mg at bedtime by mouth. Takes 1 or 2 tablets as needed   traZODone 50 MG tablet Commonly known as:  DESYREL Take 1 tablet (50 mg total) by mouth at bedtime as needed for sleep.        DISCHARGE INSTRUCTIONS:    Follow-up PMD 6 days Follow-up Dr. Ubaldo Glassing cardiology 2 weeks  If you experience worsening of your admission symptoms, develop shortness of breath, life threatening emergency, suicidal or homicidal thoughts you must seek medical attention immediately by calling 911 or calling your MD immediately  if symptoms less severe.  You Must read complete instructions/literature along with all the possible adverse reactions/side effects for all the Medicines you take and that have been prescribed to you. Take any new Medicines after you have completely understood and accept all the possible adverse reactions/side effects.   Please note  You were cared for by a hospitalist during your hospital stay. If you have any questions about your discharge medications or the care you received while you were in the hospital after you are discharged, you can call the unit and asked to speak with the hospitalist on call if the hospitalist that took care of you is not available. Once you are discharged, your primary care physician will handle any further medical issues. Please note that NO REFILLS for any discharge medications will be authorized once you are discharged, as it is imperative that you return to your primary care physician (or establish a relationship with a primary care physician if you do not have one) for your aftercare needs so that they can reassess your need for medications and monitor your lab values.    Today   CHIEF COMPLAINT:   Chief Complaint  Patient presents with  . Chest Pain    HISTORY OF PRESENT ILLNESS:  Whitney [name]  is a 67  y.o. female came in with syncope.   VITAL SIGNS:  Blood pressure 118/88, pulse 83, temperature 97.8 F (36.6 C), temperature source Oral, resp. rate 14, height 5\' 5"  (1.651 m), weight 109.2 kg (240 lb 12.8 oz), last menstrual period 01/26/2017, SpO2 100 %.    PHYSICAL EXAMINATION:  GENERAL:  67 y.o.-year-old patient lying in the bed with no acute distress.  EYES: Pupils equal, round, reactive to light and accommodation. No scleral icterus. Extraocular muscles intact.  HEENT: Head atraumatic, normocephalic. Oropharynx and nasopharynx clear.  NECK:  Supple, no jugular venous distention. No thyroid enlargement, no tenderness.  LUNGS: Normal breath sounds bilaterally, no wheezing, rales,rhonchi or crepitation. No use of accessory muscles of respiration.  CARDIOVASCULAR: S1, S2 normal. No murmurs, rubs, or gallops.  ABDOMEN: Soft, non-tender, non-distended. Bowel sounds present. No organomegaly or mass.  EXTREMITIES:  2+ edema, no cyanosis, or clubbing.  NEUROLOGIC: Cranial nerves II through XII are intact. Muscle strength 5/5 in all extremities. Sensation intact. Gait not checked.  PSYCHIATRIC: The patient is alert and oriented x 3.  SKIN: No obvious rash, lesion, or ulcer.   DATA REVIEW:   CBC Recent Labs  Lab 10/20/17 0452  WBC 5.8  HGB 12.9  HCT 40.1  PLT 285    Chemistries  Recent Labs  Lab 10/22/17 0519  NA 141  K 4.6  CL 108  CO2 25  GLUCOSE 96  BUN 33*  CREATININE 1.30*  CALCIUM 8.4*    Cardiac Enzymes Recent Labs  Lab 10/19/17 1700  TROPONINI <0.03    RADIOLOGY:  Mr Brain Wo Contrast  Result Date: 10/20/2017 CLINICAL DATA:  Syncope and nausea. EXAM: MRI HEAD WITHOUT CONTRAST TECHNIQUE: Multiplanar, multiecho pulse sequences of the brain and surrounding structures were obtained without intravenous contrast. COMPARISON:  Brain MRI 04/18/2016 FINDINGS: Brain: The midline structures are normal. There is no acute infarct or acute hemorrhage. No mass lesion,  hydrocephalus, dural abnormality or extra-axial collection. Multifocal white matter hyperintensity, most commonly due to chronic ischemic microangiopathy. No age-advanced or lobar predominant atrophy. No chronic microhemorrhage or superficial siderosis. Vascular: Major intracranial arterial and venous sinus flow voids are preserved. Skull and upper cervical spine: The visualized skull base, calvarium, upper cervical spine and extracranial soft tissues are normal. Sinuses/Orbits: No fluid levels or advanced mucosal thickening. No mastoid or middle ear effusion. Normal orbits. IMPRESSION: Chronic microvascular disease without acute intracranial abnormality. Electronically Signed   By: Ulyses Jarred M.D.   On: 10/20/2017 17:44     Management plans discussed with the patient, and she is in agreement.  CODE STATUS:  Code Status History    Date Active Date Inactive Code Status Order ID Comments User Context   10/19/2017 1839 10/22/2017 1537 Full Code [id number]  Epifanio Lesches, MD ED   07/06/2017 1128 07/08/2017 1454 Full Code [id number]  Lovell Sheehan, MD Inpatient      TOTAL TIME TAKING CARE OF THIS PATIENT: 35 minutes.    Loletha Grayer M.D on 10/22/2017 at 3:52 PM  Between 7am to 6pm - Pager - [phone]  After 6pm go to [email or web address] - password [email or web address]  [facility]  [phone]  CC: Primary care physician; Ronnell Freshwater, NP

## 2017-10-22 NOTE — Plan of Care (Signed)
  Problem: Pain Managment: Goal: General experience of comfort will improve Outcome: Progressing   Problem: Activity: Goal: Ability to return to baseline activity level will improve Outcome: Progressing   Problem: Cardiovascular: Goal: Vascular access site(s) Level 0-1 will be maintained Outcome: Progressing

## 2017-10-22 NOTE — Progress Notes (Signed)
Subjective: Patient without further syncopal events.    Objective: Current vital signs: BP 118/88 (BP Location: Left Arm)   Pulse 83   Temp 97.8 F (36.6 C) (Oral)   Resp 14   Ht 5\' 5"  (1.651 m)   Wt 109.2 kg (240 lb 12.8 oz)   LMP 01/26/2017   SpO2 100%   BMI 40.07 kg/m  Vital signs in last 24 hours: Temp:  [97.6 F (36.4 C)-98.4 F (36.9 C)] 97.8 F (36.6 C) (03/21 1004) Pulse Rate:  [52-85] 83 (03/21 1014) Resp:  [14-19] 14 (03/21 1004) BP: (118-146)/(59-91) 118/88 (03/21 1004) SpO2:  [97 %-100 %] 100 % (03/21 0836) Weight:  [109.2 kg (240 lb 12.8 oz)] 109.2 kg (240 lb 12.8 oz) (03/21 3382)  Intake/Output from previous day: 03/20 0701 - 03/21 0700 In: 5100.8 [P.O.:480; I.V.:4620.8] Out: 2000 [Urine:2000] Intake/Output this shift: Total I/O In: 360 [P.O.:360] Out: -  Nutritional status: Diet Carb Modified Fluid consistency: Thin; Room service appropriate? Yes  Neurologic Exam: Mental Status: Alert, oriented, thought content appropriate.  Speech fluent without evidence of aphasia.  Able to follow 3 step commands without difficulty. Cranial Nerves: II: Discs flat bilaterally; Visual fields grossly normal, pupils equal, round, reactive to light and accommodation III,IV, VI: ptosis not present, extra-ocular motions intact bilaterally V,VII: smile symmetric, facial light touch sensation normal bilaterally VIII: hearing normal bilaterally IX,X: gag reflex present XI: bilateral shoulder shrug XII: midline tongue extension Motor: 5/5 throughout; no atrophy noted   Lab Results: Basic Metabolic Panel: Recent Labs  Lab 10/19/17 1700 10/20/17 0452 10/22/17 0519  NA 135 135 141  K 4.6 4.2 4.6  CL 102 103 108  CO2 24 24 25   GLUCOSE 87 89 96  BUN 19 18 33*  CREATININE 1.12* 1.10* 1.30*  CALCIUM 8.3* 8.0* 8.4*    Liver Function Tests: No results for input(s): AST, ALT, ALKPHOS, BILITOT, PROT, ALBUMIN in the last 168 hours. No results for input(s): LIPASE,  AMYLASE in the last 168 hours. No results for input(s): AMMONIA in the last 168 hours.  CBC: Recent Labs  Lab 10/19/17 1751 10/20/17 0452  WBC 6.3 5.8  HGB 13.3 12.9  HCT 41.4 40.1  MCV 87.7 87.0  PLT 310 285    Cardiac Enzymes: Recent Labs  Lab 10/19/17 1700  TROPONINI <0.03    Lipid Panel: No results for input(s): CHOL, TRIG, HDL, CHOLHDL, VLDL, LDLCALC in the last 168 hours.  CBG: Recent Labs  Lab 10/20/17 0723  GLUCAP 83    Microbiology: Results for orders placed or performed in visit on 10/06/17  Microscopic Examination     Status: Abnormal   Collection Time: 10/06/17  4:24 PM  Result Value Ref Range Status   WBC, UA 0-5 0 - 5 /hpf Final   RBC, UA 0-2 0 - 2 /hpf Final   Epithelial Cells (non renal) >10 (A) 0 - 10 /hpf Final   Casts None seen None seen /lpf Final   Mucus, UA Present Not Estab. Final   Bacteria, UA Few None seen/Few Final    Coagulation Studies: No results for input(s): LABPROT, INR in the last 72 hours.  Imaging: Ct Abdomen Pelvis Wo Contrast  Result Date: 10/20/2017 CLINICAL DATA:  Abdominal distention, some nausea, dizziness, bradycardia EXAM: CT ABDOMEN AND PELVIS WITHOUT CONTRAST TECHNIQUE: Multidetector CT imaging of the abdomen and pelvis was performed following the standard protocol without IV contrast. COMPARISON:  None. FINDINGS: Lower chest: The lung bases are clear. The heart is moderately enlarged.  No pericardial effusion is seen. Coronary artery calcifications are present primarily in the distribution of the left anterior descending and circumflex coronary arteries Hepatobiliary: The liver enhances and there are several low-attenuation structures at least 1 of which appears to represent a cyst. The other is more medially position and could represent hemangioma but assessment by ultrasound or preferably MRI may be helpful. No ductal dilatation is seen. A faint hepatic granuloma is present in the right lobe. No calcified gallstones  are seen. Pancreas: The pancreas is fairly well visualized. There is an area of low-attenuation on image 28 series 2. However on sagittal and coronal images this may represent a ridging through a lobulation. This area could also be assessed by ultrasound or preferably MRI. No pancreatic ductal dilatation is seen. Spleen: The spleen is unremarkable. Adrenals/Urinary Tract: The adrenal glands appear normal. The left kidney has previously been resected the right kidney has lobular contours there is a slightly lower attenuation area emanating from the lower pole of the right kidney and ultrasound or MRI could also assess this area. No hydronephrosis is seen. There is a single focus of renovascular calcification present. The right ureter is normal in caliber. The urinary bladder is not optimally distended but no abnormality is seen. Stomach/Bowel: The stomach is moderately distended with food debris and oral contrast. No abnormality is seen. No small bowel dilatation is evident and there is no evidence of edema of the mucosa. Multiple rectosigmoid colon diverticula are present with diverticula also throughout the descending colon. Scattered more proximal colonic diverticula also are noted. No diverticulitis is seen. The terminal ileum and the appendix are unremarkable. Vascular/Lymphatic: Moderate abdominal aortic atherosclerosis is present. No adenopathy is seen. Reproductive: The uterus is somewhat lobular in contour which may indicate the presence of small fibroids, 1 of which may be pedunculated on the left. No adnexal lesion is seen. Other: A small umbilical hernia is present containing fat. There is also a lipoma within the musculature of the proximal anterior right thigh. Musculoskeletal: The lumbar vertebrae are in normal alignment with diffuse degenerative disc disease present. No compression deformity is seen. IMPRESSION: 1. No definite explanation for the patient's symptoms is seen. 2. Small low-attenuation  area within the body of the pancreas may be volume averaging but MRI or ultrasound may be helpful to assess further. 3. Lobular right kidney with questionable low-attenuation area in the lower pole. Again MRI or ultrasound may be helpful to assess further. No hydronephrosis. Prior left nephrectomy. 4. Suspect small uterine fibroids. 5. Moderate abdominal aortic atherosclerosis and diffuse coronary artery calcifications. 6. Multiple colonic diverticula primarily at within the rectosigmoid and descending colon. Electronically Signed   By: Ivar Drape M.D.   On: 10/20/2017 12:25   Mr Brain Wo Contrast  Result Date: 10/20/2017 CLINICAL DATA:  Syncope and nausea. EXAM: MRI HEAD WITHOUT CONTRAST TECHNIQUE: Multiplanar, multiecho pulse sequences of the brain and surrounding structures were obtained without intravenous contrast. COMPARISON:  Brain MRI 04/18/2016 FINDINGS: Brain: The midline structures are normal. There is no acute infarct or acute hemorrhage. No mass lesion, hydrocephalus, dural abnormality or extra-axial collection. Multifocal white matter hyperintensity, most commonly due to chronic ischemic microangiopathy. No age-advanced or lobar predominant atrophy. No chronic microhemorrhage or superficial siderosis. Vascular: Major intracranial arterial and venous sinus flow voids are preserved. Skull and upper cervical spine: The visualized skull base, calvarium, upper cervical spine and extracranial soft tissues are normal. Sinuses/Orbits: No fluid levels or advanced mucosal thickening. No mastoid or middle ear effusion.  Normal orbits. IMPRESSION: Chronic microvascular disease without acute intracranial abnormality. Electronically Signed   By: Ulyses Jarred M.D.   On: 10/20/2017 17:44    Medications:  I have reviewed the patient's current medications. Scheduled: . acetaminophen  650 mg Oral QID  . acetylcysteine  600 mg Oral BID  . aspirin  325 mg Oral Q breakfast  . atorvastatin  40 mg Oral q1800  .  carvedilol  3.125 mg Oral BID WC  . docusate sodium  100 mg Oral BID  . flecainide  50 mg Oral Q12H  . gabapentin  100 mg Oral BID  . hydrALAZINE  10 mg Oral TID  . senna  2 tablet Oral BID  . sodium chloride flush  3 mL Intravenous Q12H  . sodium chloride flush  3 mL Intravenous Q12H  . sodium chloride flush  3 mL Intravenous Q12H  . [START ON 10/25/2017] Vitamin D (Ergocalciferol)  50,000 Units Oral Q Sun    Assessment/Plan: Patient has remained stable.  MRI of the brain reviewed and shows no acute changes.  Patient to have continued follow up on an outpatient basis.     LOS: 0 days   Alexis Goodell, MD Neurology [phone] 10/22/2017  11:09 AM

## 2017-10-25 DIAGNOSIS — E559 Vitamin D deficiency, unspecified: Secondary | ICD-10-CM | POA: Insufficient documentation

## 2017-10-25 DIAGNOSIS — R300 Dysuria: Secondary | ICD-10-CM | POA: Insufficient documentation

## 2017-10-25 DIAGNOSIS — M545 Low back pain, unspecified: Secondary | ICD-10-CM | POA: Insufficient documentation

## 2017-10-25 DIAGNOSIS — E782 Mixed hyperlipidemia: Secondary | ICD-10-CM | POA: Insufficient documentation

## 2017-10-25 DIAGNOSIS — Z0001 Encounter for general adult medical examination with abnormal findings: Secondary | ICD-10-CM | POA: Insufficient documentation

## 2017-10-25 DIAGNOSIS — I10 Essential (primary) hypertension: Secondary | ICD-10-CM | POA: Insufficient documentation

## 2017-10-26 ENCOUNTER — Ambulatory Visit: Payer: Medicare Other

## 2017-10-27 LAB — MISC LABCORP TEST (SEND OUT)
LabCorp test name: 5
Labcorp test code: 4069

## 2017-10-28 ENCOUNTER — Ambulatory Visit: Payer: Medicare Other

## 2017-10-28 DIAGNOSIS — R262 Difficulty in walking, not elsewhere classified: Secondary | ICD-10-CM | POA: Diagnosis not present

## 2017-10-28 DIAGNOSIS — M25562 Pain in left knee: Secondary | ICD-10-CM

## 2017-10-28 DIAGNOSIS — M25551 Pain in right hip: Secondary | ICD-10-CM

## 2017-10-28 DIAGNOSIS — M6281 Muscle weakness (generalized): Secondary | ICD-10-CM | POA: Diagnosis not present

## 2017-10-28 NOTE — Therapy (Signed)
[facility] [address], [place], [address] Phone: [phone]   Fax:  [phone]  Physical Therapy Treatment  Patient Details  Name: Whitney [name] MRN: [id number] Date of Birth: Jul 10, 1951 Referring Provider: Kurtis Bushman MD   Encounter Date: 10/28/2017  PT End of Session - 10/28/17 1440    Visit Number  13    Number of Visits  20    Date for PT Re-Evaluation  11/11/17    PT Start Time  1829    PT Stop Time  1415    PT Time Calculation (min)  30 min    Activity Tolerance  Patient tolerated treatment well    Behavior During Therapy  [facility] for tasks assessed/performed       Past Medical History:  Diagnosis Date  . Abnormal Pap smear of cervix    Pt states she had colposcopy   . Arthritis   . Chronic kidney disease   . Hx of unilateral nephrectomy 1979   Left Nephrectomy  . Hypertension   . PMB (postmenopausal bleeding)   . Restless leg syndrome   . Vitamin D deficiency     Past Surgical History:  Procedure Laterality Date  . HYSTEROSCOPY W/D&C N/A 02/16/2017   Procedure: DILATATION AND CURETTAGE /HYSTEROSCOPY;  Surgeon: Defrancesco, Alanda Slim, MD;  Location: [facility] ORS;  Service: Gynecology;  Laterality: N/A;  . KIDNEY SURGERY Left 1979   left kidney removed  . LEFT HEART CATH AND CORONARY ANGIOGRAPHY N/A 10/21/2017   Procedure: LEFT HEART CATH AND CORONARY ANGIOGRAPHY;  Surgeon: Teodoro Spray, MD;  Location: [place] [facility];  Service: Cardiovascular;  Laterality: N/A;  . TOTAL KNEE ARTHROPLASTY Left 07/06/2017   Procedure: TOTAL KNEE ARTHROPLASTY;  Surgeon: Lovell Sheehan, MD;  Location: [facility] ORS;  Service: Orthopedics;  Laterality: Left;  . WRIST ARTHROSCOPY Left 1970s    There were no vitals filed for this visit.  Subjective Assessment - 10/28/17 1438    Subjective  Patient reports no heart palpitations since leaving the hospital and states she has been feelings much better. Patient  reports she has not been performing much walking since leaving the hospital.     Pertinent History  s/p TKA 07/06/2017; right knee arthritis with weakness as well with reports of right and left leg "giving way" intermittently    Limitations  Sitting;Walking;Standing;House hold activities;Other (comment)    How long can you sit comfortably?  forever    How long can you stand comfortably?  5 minutes    How long can you walk comfortably?  "not at all"    Diagnostic tests  imaging - see scanned docs    Patient Stated Goals  improve strength and motion left knee, walk without AD    Currently in Pain?  No/denies        TREATMENT: Therapeutic Exercise Hip side stepping 2 x 24ft performed B Forward ambulation without FWW and focusing on core activation with performance 243ft , 89ft Backwards walking without FWW - 8 x 59ft   Patient demonstrates increased fatigue with exercise   PT Education - 10/28/17 1440    Education provided  Yes    Education Details  form/technique with exercise    Person(s) Educated  Patient    Methods  Explanation;Demonstration    Comprehension  Verbalized understanding;Returned demonstration          PT Long Term Goals - 10/14/17 1526      PT LONG TERM GOAL #  1   Title  Pt will improve 10 MWS to .8 M/S to improve functional activity tolerance/gait    Baseline  15 seconds (.67 m/s and using rolling walker); .8 m/s with FWW 09/14/17; 10/14/17: .95    Status  Achieved      PT LONG TERM GOAL #2   Title  Pt will improve 10 MWS to 1.0 M/S to improve functional activity tolerance/gait    Baseline  15 seconds (.59m/s); 09/14/17: .82 m/s without AD; 10/14/17: .95    Status  On-going      PT LONG TERM GOAL #3   Title  Patient will be able to ambulate independently with least restrictive AD safely on level surfaces, uneven surfaces and curbs to improve functional tasks at home and community    Baseline  rolling walker, limited function at home and community; 09/14/17  requires use of FWW for long distances; 10/14/17: unable to amb over uneven surfaces    Status  On-going      PT LONG TERM GOAL #4   Title  LEFS improved to 35/80 demonstrating improved self perceived disabiltiy with daily tasks     Baseline  LEFS 26/80; 09/14/17: defer until next visit; 10/14/2017: 50/80    Status  Achieved      PT LONG TERM GOAL #5   Title  LEFS improved to 45/80 or better demonstrating improved self perceived disabiltiy with daily tasks     Baseline  LEFS 26/80; 09/14/17: defer until next visit;10/14/17: .95    Status  Achieved      PT LONG TERM GOAL #6   Title  Patient will be independent with home program for ROM, strengthening exercises in order to transition to self managment once discharged from physical therapy    Baseline  requires guidance, verbal cues to perform all exercises; Requires cueing to perform walking with postural sway; 10/14/17: increased postural sway with EXercise    Time  6    Period  Weeks    Status  On-going      PT LONG TERM GOAL #7   Title  Patient will improve her 41minWT to >1046ft with least restrictive AD to improve ability to ambulate in the community without need for rest breaks.    Baseline  750 ft amb; 10/14/17: 1034ft with FWW    Time  6    Period  Weeks    Status  On-going            Plan - 10/28/17 1441    Clinical Impression Statement  Patient BP 149/90 at the beginning of therapy with a HR of 76bpm. Performed light walking today to not over fatigue patient and performed less exercise compared to previous visits. Patient's HR was monitored throughout session with a max HR noted of 92bpm after sitting down after exercise. Patient's HR took 2-10min to become <80bpm. Patient demonstrates decreased balance with ambulation backwards. Patient will benefit from further skilled therapy to return to prior level of function.     Clinical Impairments Affecting Rehab Potential  (+) motivated, acute condition (-) arthritis right knee with pain     PT Frequency  2x / week    PT Duration  6 weeks    PT Treatment/Interventions  Electrical Stimulation;Cryotherapy;Moist Heat;Gait training;Stair training;Therapeutic activities;Therapeutic exercise;Balance training;Patient/family education;Neuromuscular re-education;Manual techniques    PT Next Visit Plan  exercise progression for ROM, strength    PT Home Exercise Plan  see patient education    Consulted and Agree with Plan of Care  Patient       Patient will benefit from skilled therapeutic intervention in order to improve the following deficits and impairments:  Pain, Decreased activity tolerance, Decreased endurance, Decreased range of motion, Decreased strength, Impaired perceived functional ability, Difficulty walking  Visit Diagnosis: Acute pain of left knee  Pain in right hip  Difficulty in walking, not elsewhere classified     Problem List Patient Active Problem List   Diagnosis Date Noted  . Essential hypertension 10/25/2017  . Mixed hyperlipidemia 10/25/2017  . Low back pain at multiple sites 10/25/2017  . Dysuria 10/25/2017  . Encounter for general adult medical examination with abnormal findings 10/25/2017  . Vitamin D deficiency 10/25/2017  . Near syncope   . Symptomatic bradycardia 10/19/2017  . Total knee replacement status, left 07/06/2017  . Chest pain 06/29/2017  . Postop check 02/25/2017  . Obesity (BMI 35.0-39.9 without comorbidity) 07/15/2016  . Endometrial polyp 07/15/2016  . Class 3 obesity due to excess calories with body mass index (BMI) of 50.0 to 59.9 in adult 07/15/2016  . Family history of breast cancer in first degree relative 07/15/2016  . Family history of ovarian cancer 07/15/2016    Blythe Stanford, PT DPT 10/28/2017, 2:46 PM  [facility] [address] [place], [address] Phone: [phone]   Fax:  [phone]  Name: Whitney [name] MRN: [id number] Date of Birth:  26-Jul-1951

## 2017-10-29 ENCOUNTER — Encounter: Payer: Self-pay | Admitting: Internal Medicine

## 2017-10-29 ENCOUNTER — Ambulatory Visit (INDEPENDENT_AMBULATORY_CARE_PROVIDER_SITE_OTHER): Payer: Medicare Other | Admitting: Internal Medicine

## 2017-10-29 VITALS — BP 145/88 | HR 73 | Resp 16 | Ht 65.0 in | Wt 246.0 lb

## 2017-10-29 DIAGNOSIS — I10 Essential (primary) hypertension: Secondary | ICD-10-CM | POA: Diagnosis not present

## 2017-10-29 DIAGNOSIS — E782 Mixed hyperlipidemia: Secondary | ICD-10-CM

## 2017-10-29 DIAGNOSIS — I482 Chronic atrial fibrillation, unspecified: Secondary | ICD-10-CM

## 2017-10-29 DIAGNOSIS — G479 Sleep disorder, unspecified: Secondary | ICD-10-CM

## 2017-10-29 DIAGNOSIS — G5601 Carpal tunnel syndrome, right upper limb: Secondary | ICD-10-CM | POA: Diagnosis not present

## 2017-10-29 NOTE — Progress Notes (Signed)
[facility] [place], [place] [address]  Internal MEDICINE  Office Visit Note  Patient Name: Whitney [name]  [id number]  [id number]  Date of Service: 11/11/2017     Chief Complaint  Patient presents with  . Hypertension  . Osteoarthritis     HPI Pt is here for recent hospital follow up.Pt was admitted with symptoms of Syncope and was found to be in bigemnt and arrhythmia. She did convert to NSR. Breathing is better. Pt is also c/o knee pain, difficulty walking. She does have sleep problems with snoring and choking type symptoms. BP is still not well controlled   Current Medication: Outpatient Encounter Medications as of 10/29/2017  Medication Sig  . acetaminophen (TYLENOL) 325 MG tablet Take 650 mg by mouth 4 (four) times daily as needed.   Marland Kitchen aspirin EC 81 MG tablet Take 1 tablet (81 mg total) by mouth daily.  Marland Kitchen atorvastatin (LIPITOR) 40 MG tablet Take 1 tablet (40 mg total) by mouth daily at 6 PM.  . carvedilol (COREG) 3.125 MG tablet Take 1 tablet (3.125 mg total) by mouth 2 (two) times daily with a meal.  . flecainide (TAMBOCOR) 50 MG tablet Take 1 tablet (50 mg total) by mouth every 12 (twelve) hours.  . gabapentin (NEURONTIN) 100 MG capsule Take 1 capsule (100 mg total) by mouth 2 (two) times daily.  . hydrALAZINE (APRESOLINE) 10 MG tablet Take 1 tablet (10 mg total) by mouth 3 (three) times daily.  Marland Kitchen rOPINIRole (REQUIP) 0.5 MG tablet Take 0.5 mg at bedtime by mouth. Takes 1 or 2 tablets as needed  . traZODone (DESYREL) 50 MG tablet Take 1 tablet (50 mg total) by mouth at bedtime as needed for sleep.   No facility-administered encounter medications on file as of 10/29/2017.     Surgical History: Past Surgical History:  Procedure Laterality Date  . HYSTEROSCOPY W/D&C N/A 02/16/2017   Procedure: DILATATION AND CURETTAGE /HYSTEROSCOPY;  Surgeon: Defrancesco, Alanda Slim, MD;  Location: [facility] ORS;  Service: Gynecology;  Laterality: N/A;  . KIDNEY SURGERY  Left 1979   left kidney removed  . LEFT HEART CATH AND CORONARY ANGIOGRAPHY N/A 10/21/2017   Procedure: LEFT HEART CATH AND CORONARY ANGIOGRAPHY;  Surgeon: Teodoro Spray, MD;  Location: [place] [facility];  Service: Cardiovascular;  Laterality: N/A;  . TOTAL KNEE ARTHROPLASTY Left 07/06/2017   Procedure: TOTAL KNEE ARTHROPLASTY;  Surgeon: Lovell Sheehan, MD;  Location: [facility] ORS;  Service: Orthopedics;  Laterality: Left;  . WRIST ARTHROSCOPY Left 1970s    Medical History: Past Medical History:  Diagnosis Date  . Abnormal Pap smear of cervix    Pt states she had colposcopy   . Arthritis   . Chronic kidney disease   . Hx of unilateral nephrectomy 1979   Left Nephrectomy  . Hypertension   . PMB (postmenopausal bleeding)   . Restless leg syndrome   . Vitamin D deficiency     Family History: Family History  Problem Relation Age of Onset  . Ovarian cancer Mother   . Hypertension Father   . Diabetes Father   . Cancer Father   . Breast cancer Sister   . Diabetes Sister     Social History   Socioeconomic History  . Marital status: Divorced    Spouse name: Not on file  . Number of children: Not on file  . Years of education: Not on file  . Highest education level: Not on file  Occupational History  . Not  on file  Social Needs  . Financial resource strain: Not on file  . Food insecurity:    Worry: Not on file    Inability: Not on file  . Transportation needs:    Medical: Not on file    Non-medical: Not on file  Tobacco Use  . Smoking status: Never Smoker  . Smokeless tobacco: Never Used  Substance and Sexual Activity  . Alcohol use: Yes    Comment: occasionally  . Drug use: Yes    Frequency: 3.0 times per week    Types: Marijuana, Cocaine    Comment: Pt states she uses for leg pain: marijuana; states no longer uses cocaine  . Sexual activity: Yes    Birth control/protection: None  Lifestyle  . Physical activity:    Days per week: Not on file    Minutes per  session: Not on file  . Stress: Not on file  Relationships  . Social connections:    Talks on phone: Not on file    Gets together: Not on file    Attends religious service: Not on file    Active member of club or organization: Not on file    Attends meetings of clubs or organizations: Not on file    Relationship status: Not on file  . Intimate partner violence:    Fear of current or ex partner: Not on file    Emotionally abused: Not on file    Physically abused: Not on file    Forced sexual activity: Not on file  Other Topics Concern  . Not on file  Social History Narrative  . Not on file   Review of Systems  Constitutional: Negative for chills, fatigue and unexpected weight change.  HENT: Positive for postnasal drip. Negative for congestion, rhinorrhea, sneezing and sore throat.   Eyes: Negative for redness.  Respiratory: Negative for cough, chest tightness and shortness of breath.   Cardiovascular: Negative for chest pain and palpitations.  Gastrointestinal: Negative for abdominal pain, constipation, diarrhea, nausea and vomiting.  Genitourinary: Negative for dysuria and frequency.  Musculoskeletal: Negative for arthralgias, back pain, joint swelling and neck pain.  Skin: Negative for rash.  Neurological: Negative.  Negative for tremors and numbness.  Hematological: Negative for adenopathy. Does not bruise/bleed easily.  Psychiatric/Behavioral: Negative for behavioral problems (Depression), sleep disturbance and suicidal ideas. The patient is not nervous/anxious.     Vital Signs: BP (!) 145/88   Pulse 73   Resp 16   Ht 5\' 5"  (1.651 m)   Wt 246 lb (111.6 kg)   LMP 01/26/2017   SpO2 97%   BMI 40.94 kg/m    Physical Exam  Constitutional: She is oriented to person, place, and time. She appears well-developed and well-nourished. No distress.  HENT:  Head: Normocephalic and atraumatic.  Mouth/Throat: Oropharynx is clear and moist. No oropharyngeal exudate.  Eyes:  Pupils are equal, round, and reactive to light. EOM are normal.  Neck: Normal range of motion. Neck supple. No JVD present. No tracheal deviation present. No thyromegaly present.  Cardiovascular: Normal rate, regular rhythm and normal heart sounds. Exam reveals no gallop and no friction rub.  No murmur heard. Pulmonary/Chest: Effort normal. No respiratory distress. She has no wheezes. She has no rales. She exhibits no tenderness.  Abdominal: Soft. Bowel sounds are normal.  Musculoskeletal: Normal range of motion. She exhibits tenderness and deformity.  Knee   Lymphadenopathy:    She has no cervical adenopathy.  Neurological: She is alert and oriented to  person, place, and time. No cranial nerve deficit.  Skin: Skin is warm and dry. She is not diaphoretic.  Psychiatric: She has a normal mood and affect. Her behavior is normal. Judgment and thought content normal.   Assessment/Plan: 1. Chronic atrial fibrillation (HCC) - Controlled and stable  - Ambulatory referral to Cardiology - [facility] SLEEP STUDY; Future  2. Sleep disturbances - [facility] SLEEP STUDY; Future  3. Essential hypertension - Continue all medications as before   4. Mixed hyperlipidemia Normal   5. Morbid obesity ([place]) - Needs deficit, since she is not active   General Counseling: Mazal verbalizes understanding of the findings of todays visit and agrees with plan of treatment. I have discussed any further diagnostic evaluation that may be needed or ordered today. We also reviewed her medications today. she has been encouraged to call the office with any questions or concerns that should arise related to todays visit. Obesity Counseling: Risk Assessment: An assessment of behavioral risk factors was made today and includes lack of exercise sedentary lifestyle, lack of portion control and poor dietary habits.  Risk Modification Advice: She was counseled on portion control guidelines. Restricting daily caloric intake to. . The  detrimental long term effects of obesity on her health and ongoing poor compliance was also discussed with the patient.   Orders Placed This Encounter  Procedures  . Ambulatory referral to Cardiology  . [facility] SLEEP STUDY   I have reviewed all medical records from hospital follow up including radiology reports and consults from other physicians. Appropriate follow up diagnostics will be scheduled as needed. Patient/ Family understands the plan of treatment. Time spen t25  minutes.   Dr Lavera Guise, MD Internal Medicine

## 2017-10-30 ENCOUNTER — Telehealth: Payer: Self-pay | Admitting: Internal Medicine

## 2017-10-30 NOTE — Telephone Encounter (Signed)
Tried calling pt to advise of cardio refer appt [facility] cardio dr Ubaldo Glassing 11/02/17  9:45 [phone]/BR

## 2017-11-02 ENCOUNTER — Ambulatory Visit
Admit: 2017-11-02 | Discharge: 2017-11-02 | Disposition: A | Discharge status: Home / Self Care | Payer: Medicare Other | Attending: Internal Medicine | Admitting: Internal Medicine

## 2017-11-02 ENCOUNTER — Ambulatory Visit: Payer: Medicare Other | Attending: Orthopedic Surgery

## 2017-11-02 ENCOUNTER — Ambulatory Visit
Admit: 2017-11-02 | Discharge: 2017-11-02 | Disposition: A | Discharge status: Home / Self Care | Payer: Medicare Other | Attending: Nurse Practitioner | Admitting: Nurse Practitioner

## 2017-11-02 DIAGNOSIS — M25551 Pain in right hip: Secondary | ICD-10-CM | POA: Insufficient documentation

## 2017-11-02 DIAGNOSIS — Z1231 Encounter for screening mammogram for malignant neoplasm of breast: Secondary | ICD-10-CM | POA: Insufficient documentation

## 2017-11-02 DIAGNOSIS — R262 Difficulty in walking, not elsewhere classified: Secondary | ICD-10-CM

## 2017-11-02 DIAGNOSIS — Z1239 Encounter for other screening for malignant neoplasm of breast: Secondary | ICD-10-CM

## 2017-11-02 DIAGNOSIS — M25562 Pain in left knee: Secondary | ICD-10-CM | POA: Diagnosis not present

## 2017-11-02 DIAGNOSIS — N6489 Other specified disorders of breast: Secondary | ICD-10-CM | POA: Insufficient documentation

## 2017-11-02 DIAGNOSIS — R928 Other abnormal and inconclusive findings on diagnostic imaging of breast: Secondary | ICD-10-CM | POA: Diagnosis not present

## 2017-11-02 NOTE — Therapy (Signed)
[facility] [address] [place], [address] Phone: [phone]   Fax:  [phone]  Physical Therapy Treatment  Patient Details  Name: Whitney [name] MRN: [id number] Date of Birth: [birth year] Referring Provider: Kurtis Bushman MD   Encounter Date: 11/02/2017  PT End of Session - 11/02/17 1406    Visit Number  14    Number of Visits  20    Date for PT Re-Evaluation  11/11/17    PT Start Time  1224    PT Stop Time  1430    PT Time Calculation (min)  42 min    Activity Tolerance  Patient tolerated treatment well    Behavior During Therapy  [facility] for tasks assessed/performed       Past Medical History:  Diagnosis Date  . Abnormal Pap smear of cervix    Pt states she had colposcopy   . Arthritis   . Chronic kidney disease   . Hx of unilateral nephrectomy 1979   Left Nephrectomy  . Hypertension   . PMB (postmenopausal bleeding)   . Restless leg syndrome   . Vitamin D deficiency     Past Surgical History:  Procedure Laterality Date  . HYSTEROSCOPY W/D&C N/A 02/16/2017   Procedure: DILATATION AND CURETTAGE /HYSTEROSCOPY;  Surgeon: Defrancesco, Alanda Slim, MD;  Location: [facility] ORS;  Service: Gynecology;  Laterality: N/A;  . KIDNEY SURGERY Left 1979   left kidney removed  . LEFT HEART CATH AND CORONARY ANGIOGRAPHY N/A 10/21/2017   Procedure: LEFT HEART CATH AND CORONARY ANGIOGRAPHY;  Surgeon: Teodoro Spray, MD;  Location: [place];  Service: Cardiovascular;  Laterality: N/A;  . TOTAL KNEE ARTHROPLASTY Left 07/06/2017   Procedure: TOTAL KNEE ARTHROPLASTY;  Surgeon: Lovell Sheehan, MD;  Location: [facility] ORS;  Service: Orthopedics;  Laterality: Left;  . WRIST ARTHROSCOPY Left 1970s    There were no vitals filed for this visit.  Subjective Assessment - 11/02/17 1401    Subjective  Patient reports no major changes since the previous session. Patient states she has a slight pain along the knee today.      Pertinent History  s/p TKA 07/06/2017; right knee arthritis with weakness as well with reports of right and left leg "giving way" intermittently    Limitations  Sitting;Walking;Standing;House hold activities;Other (comment)    How long can you sit comfortably?  forever    How long can you stand comfortably?  5 minutes    How long can you walk comfortably?  "not at all"    Diagnostic tests  imaging - see scanned docs    Patient Stated Goals  improve strength and motion left knee, walk without AD    Currently in Pain?  No/denies        TREATMENT: Therapeutic Exercise Nustep seat level at 8 grade @ level 3 - 49min Seated knee flexion/extension with weighted ball - [employer] marches with external rotation with ambulation - x 37ft x 2 Forward ambulation without FWW and focusing on core activation with performance 235ft , 52ft   Patient demonstrates increased fatigue with exercise   PT Education - 11/02/17 1405    Education provided  Yes    Education Details  form/technique with exercise    Person(s) Educated  Patient    Methods  Explanation;Demonstration    Comprehension  Verbalized understanding;Returned demonstration          PT Long Term Goals - 10/14/17 1526  PT LONG TERM GOAL #1   Title  Pt will improve 10 MWS to .8 M/S to improve functional activity tolerance/gait    Baseline  15 seconds (.67 m/s and using rolling walker); .8 m/s with FWW 09/14/17; 10/14/17: .95    Status  Achieved      PT LONG TERM GOAL #2   Title  Pt will improve 10 MWS to 1.0 M/S to improve functional activity tolerance/gait    Baseline  15 seconds (.53m/s); 09/14/17: .82 m/s without AD; 10/14/17: .95    Status  On-going      PT LONG TERM GOAL #3   Title  Patient will be able to ambulate independently with least restrictive AD safely on level surfaces, uneven surfaces and curbs to improve functional tasks at home and community    Baseline  rolling walker, limited function at home and community;  09/14/17 requires use of FWW for long distances; 10/14/17: unable to amb over uneven surfaces    Status  On-going      PT LONG TERM GOAL #4   Title  LEFS improved to 35/80 demonstrating improved self perceived disabiltiy with daily tasks     Baseline  LEFS 26/80; 09/14/17: defer until next visit; 10/14/2017: 50/80    Status  Achieved      PT LONG TERM GOAL #5   Title  LEFS improved to 45/80 or better demonstrating improved self perceived disabiltiy with daily tasks     Baseline  LEFS 26/80; 09/14/17: defer until next visit;10/14/17: .95    Status  Achieved      PT LONG TERM GOAL #6   Title  Patient will be independent with home program for ROM, strengthening exercises in order to transition to self managment once discharged from physical therapy    Baseline  requires guidance, verbal cues to perform all exercises; Requires cueing to perform walking with postural sway; 10/14/17: increased postural sway with EXercise    Time  6    Period  Weeks    Status  On-going      PT LONG TERM GOAL #7   Title  Patient will improve her 41minWT to >1051ft with least restrictive AD to improve ability to ambulate in the community without need for rest breaks.    Baseline  750 ft amb; 10/14/17: 1080ft with FWW    Time  6    Period  Weeks    Status  On-going            Plan - 11/02/17 1406    Clinical Impression Statement  Patient's BP was 138/96mmHg at the beginning of the session with a HR of 76bpm. Performed sitting exercises today to address endurance and strength limitations and continue benefits of therapy. Patient demonstrates similar trunk lean with ambulating compared to the previous session. Patient demosntrates decreased knee pain at the end of the session and will benefit from further skilled therapy to return to prior level of function.     Clinical Impairments Affecting Rehab Potential  (+) motivated, acute condition (-) arthritis right knee with pain    PT Frequency  2x / week    PT Duration   6 weeks    PT Treatment/Interventions  Electrical Stimulation;Cryotherapy;Moist Heat;Gait training;Stair training;Therapeutic activities;Therapeutic exercise;Balance training;Patient/family education;Neuromuscular re-education;Manual techniques    PT Next Visit Plan  exercise progression for ROM, strength    PT Home Exercise Plan  see patient education    Consulted and Agree with Plan of Care  Patient  Patient will benefit from skilled therapeutic intervention in order to improve the following deficits and impairments:  Pain, Decreased activity tolerance, Decreased endurance, Decreased range of motion, Decreased strength, Impaired perceived functional ability, Difficulty walking  Visit Diagnosis: Acute pain of left knee  Pain in right hip  Difficulty in walking, not elsewhere classified     Problem List Patient Active Problem List   Diagnosis Date Noted  . Essential hypertension 10/25/2017  . Mixed hyperlipidemia 10/25/2017  . Low back pain at multiple sites 10/25/2017  . Dysuria 10/25/2017  . Encounter for general adult medical examination with abnormal findings 10/25/2017  . Vitamin D deficiency 10/25/2017  . Near syncope   . Symptomatic bradycardia 10/19/2017  . Total knee replacement status, left 07/06/2017  . Chest pain 06/29/2017  . Postop check 02/25/2017  . Obesity (BMI 35.0-39.9 without comorbidity) 07/15/2016  . Endometrial polyp 07/15/2016  . Class 3 obesity due to excess calories with body mass index (BMI) of 50.0 to 59.9 in adult 07/15/2016  . Family history of breast cancer in first degree relative 07/15/2016  . Family history of ovarian cancer 07/15/2016    Blythe Stanford, PT DPT 11/02/2017, 2:27 PM  [facility] [address] [place], [address] Phone: [phone]   Fax:  [phone]  Name: Whitney [name] MRN: [id number] Date of Birth: 1950-08-15

## 2017-11-03 ENCOUNTER — Encounter: Payer: Self-pay | Admitting: Orthopedic Surgery

## 2017-11-04 ENCOUNTER — Ambulatory Visit: Payer: Medicare Other

## 2017-11-09 DIAGNOSIS — I10 Essential (primary) hypertension: Secondary | ICD-10-CM | POA: Diagnosis not present

## 2017-11-09 DIAGNOSIS — I480 Paroxysmal atrial fibrillation: Secondary | ICD-10-CM | POA: Diagnosis not present

## 2017-11-10 ENCOUNTER — Telehealth: Payer: Self-pay | Admitting: Internal Medicine

## 2017-11-10 ENCOUNTER — Telehealth: Payer: Self-pay

## 2017-11-10 ENCOUNTER — Ambulatory Visit: Payer: Medicare Other

## 2017-11-10 ENCOUNTER — Other Ambulatory Visit: Payer: Self-pay | Admitting: Internal Medicine

## 2017-11-10 NOTE — Telephone Encounter (Signed)
Called patient to provide her benefits for allergy testing; per patient she is only interested in the psg not the allergy testing.Whitney [name]

## 2017-11-11 NOTE — Telephone Encounter (Signed)
Do I need to schedule this at her next visit?

## 2017-11-12 DIAGNOSIS — I480 Paroxysmal atrial fibrillation: Secondary | ICD-10-CM | POA: Insufficient documentation

## 2017-11-16 ENCOUNTER — Ambulatory Visit: Payer: Medicare Other

## 2017-11-17 DIAGNOSIS — M79605 Pain in left leg: Secondary | ICD-10-CM | POA: Diagnosis not present

## 2017-11-17 DIAGNOSIS — R29898 Other symptoms and signs involving the musculoskeletal system: Secondary | ICD-10-CM | POA: Diagnosis not present

## 2017-11-17 DIAGNOSIS — M79604 Pain in right leg: Secondary | ICD-10-CM | POA: Diagnosis not present

## 2017-11-18 ENCOUNTER — Ambulatory Visit: Payer: Medicare Other

## 2017-11-18 ENCOUNTER — Other Ambulatory Visit: Payer: Self-pay

## 2017-11-18 DIAGNOSIS — M25562 Pain in left knee: Secondary | ICD-10-CM | POA: Diagnosis not present

## 2017-11-18 DIAGNOSIS — R262 Difficulty in walking, not elsewhere classified: Secondary | ICD-10-CM | POA: Diagnosis not present

## 2017-11-18 DIAGNOSIS — M25551 Pain in right hip: Secondary | ICD-10-CM | POA: Diagnosis not present

## 2017-11-18 MED ORDER — ATORVASTATIN CALCIUM 40 MG PO TABS: 40.0000 mg | ORAL_TABLET | Freq: Every day | ORAL | 5 refills | Status: DC

## 2017-11-18 NOTE — Therapy (Signed)
[facility] [address] [place], [address] Phone: [phone]   Fax:  [phone]  Physical Therapy Treatment Physical Therapy Progress Note   Dates of reporting period  10/13/2017   to   11/18/2017  Patient Details  Name: Whitney [name] MRN: [id number] Date of Birth: 10/15/1950 Referring Provider: Kurtis Bushman MD   Encounter Date: 11/18/2017  PT End of Session - 11/18/17 1458    Visit Number  15    Number of Visits  20    Date for PT Re-Evaluation  12/16/17    Authorization Type  Progress note today    PT Start Time  1430    PT Stop Time  1515    PT Time Calculation (min)  45 min    Activity Tolerance  Patient tolerated treatment well    Behavior During Therapy  [facility] for tasks assessed/performed       Past Medical History:  Diagnosis Date  . Abnormal Pap smear of cervix    Pt states she had colposcopy   . Arthritis   . Chronic kidney disease   . Hx of unilateral nephrectomy 1979   Left Nephrectomy  . Hypertension   . PMB (postmenopausal bleeding)   . Restless leg syndrome   . Vitamin D deficiency     Past Surgical History:  Procedure Laterality Date  . HYSTEROSCOPY W/D&C N/A 02/16/2017   Procedure: DILATATION AND CURETTAGE /HYSTEROSCOPY;  Surgeon: Defrancesco, Alanda Slim, MD;  Location: [facility];  Service: Gynecology;  Laterality: N/A;  . KIDNEY SURGERY Left 1979   left kidney removed  . LEFT HEART CATH AND CORONARY ANGIOGRAPHY N/A 10/21/2017   Procedure: LEFT HEART CATH AND CORONARY ANGIOGRAPHY;  Surgeon: Teodoro Spray, MD;  Location: [place] [facility];  Service: Cardiovascular;  Laterality: N/A;  . TOTAL KNEE ARTHROPLASTY Left 07/06/2017   Procedure: TOTAL KNEE ARTHROPLASTY;  Surgeon: Lovell Sheehan, MD;  Location: [facility];  Service: Orthopedics;  Laterality: Left;  . WRIST ARTHROSCOPY Left 1970s    There were no vitals filed for this visit.  Subjective Assessment - 11/18/17 1537     Subjective  Patient reports she has been sick over the past few days and was not able to coem to therapy. Patient reports she feels confident with performing exercises at the gym but would like to go over technique with exercise.     Pertinent History  s/p TKA 07/06/2017; right knee arthritis with weakness as well with reports of right and left leg "giving way" intermittently    Limitations  Sitting;Walking;Standing;House hold activities;Other (comment)    How long can you sit comfortably?  forever    How long can you stand comfortably?  5 minutes    How long can you walk comfortably?  "not at all"    Diagnostic tests  imaging - see scanned docs    Patient Stated Goals  improve strength and motion left knee, walk without AD    Currently in Pain?  No/denies               TREATMENT: Therapeutic Exercise Nustep seat level at 8 grade @ level 3 - 77min Forward ambulation with SPC and focusing on core activation with performance 38ft  Step ups on 6" step - x 20 Hip extension in standing with YTB - x 20    Patient demonstrates increased fatigue with exercise     PT Education - 11/18/17 1452    Education provided  Yes    Education Details  form/technique with exercise    Person(s) Educated  Patient    Methods  Explanation;Demonstration    Comprehension  Verbalized understanding;Returned demonstration          PT Long Term Goals - 11/18/17 1453      PT LONG TERM GOAL #1   Title  Pt will improve 10 MWS to .8 M/S to improve functional activity tolerance/gait    Baseline  15 seconds (.67 m/s and using rolling walker); .8 m/s with FWW 09/14/17; 10/14/17: .95    Status  Achieved      PT LONG TERM GOAL #2   Title  Pt will improve 10 MWS to 1.0 M/S to improve functional activity tolerance/gait    Baseline  15 seconds (.8m/s); 09/14/17: .82 m/s without AD; 10/14/17: .95; 11/18/17: 1 m/s    Status  Achieved      PT LONG TERM GOAL #3   Title  Patient will be able to ambulate  independently with least restrictive AD safely on level surfaces, uneven surfaces and curbs to improve functional tasks at home and community    Baseline  rolling walker, limited function at home and community; 09/14/17 requires use of FWW for long distances; 10/14/17: unable to amb over uneven surfaces    Status  Achieved      PT LONG TERM GOAL #4   Title  LEFS improved to 35/80 demonstrating improved self perceived disabiltiy with daily tasks     Baseline  LEFS 26/80; 09/14/17: defer until next visit; 10/14/2017: 50/80    Status  Achieved      PT LONG TERM GOAL #5   Title  LEFS improved to 45/80 or better demonstrating improved self perceived disabiltiy with daily tasks     Baseline  LEFS 26/80; 09/14/17: defer until next visit;10/14/17: .95    Status  Achieved      PT LONG TERM GOAL #6   Title  Patient will be independent with home program for ROM, strengthening exercises in order to transition to self managment once discharged from physical therapy    Baseline  requires guidance, verbal cues to perform all exercises; Requires cueing to perform walking with postural sway; 10/14/17: increased postural sway with EXercise    Time  6    Period  Weeks    Status  On-going      PT LONG TERM GOAL #7   Title  Patient will improve her 72minWT to >1089ft with least restrictive AD to improve ability to ambulate in the community without need for rest breaks.    Baseline  750 ft amb; 10/14/17: 1041ft with FWW    Time  6    Period  Weeks    Status  Achieved            Plan - 11/18/17 1525    Clinical Impression Statement  Patient's BP was 156/90 and performed therapy today with increased rest breaks throughout the session. Patient is making progress towards long term goals with ability to ambulate with an SPC and at a greater gait speed indicating decreased fall risk with exercise. Patient continues to require increased cueing for exercise performance indicating poor coordination. Patient will benefit  from further skilled therapy to return to prior level of function.     Clinical Impairments Affecting Rehab Potential  (+) motivated, acute condition (-) arthritis right knee with pain    PT Frequency  2x / week    PT Duration  6 weeks  PT Treatment/Interventions  Consulting civil engineer;Therapeutic activities;Therapeutic exercise;Balance training;Patient/family education;Neuromuscular re-education;Manual techniques    PT Next Visit Plan  exercise progression for ROM, strength    PT Home Exercise Plan  see patient education    Consulted and Agree with Plan of Care  Patient       Patient will benefit from skilled therapeutic intervention in order to improve the following deficits and impairments:  Pain, Decreased activity tolerance, Decreased endurance, Decreased range of motion, Decreased strength, Impaired perceived functional ability, Difficulty walking  Visit Diagnosis: Acute pain of left knee  Pain in right hip  Difficulty in walking, not elsewhere classified     Problem List Patient Active Problem List   Diagnosis Date Noted  . Essential hypertension 10/25/2017  . Mixed hyperlipidemia 10/25/2017  . Low back pain at multiple sites 10/25/2017  . Dysuria 10/25/2017  . Encounter for general adult medical examination with abnormal findings 10/25/2017  . Vitamin D deficiency 10/25/2017  . Near syncope   . Symptomatic bradycardia 10/19/2017  . Total knee replacement status, left 07/06/2017  . Chest pain 06/29/2017  . Postop check 02/25/2017  . Obesity (BMI 35.0-39.9 without comorbidity) 07/15/2016  . Endometrial polyp 07/15/2016  . Morbid obesity ([place]) 07/15/2016  . Family history of breast cancer in first degree relative 07/15/2016  . Family history of ovarian cancer 07/15/2016    Blythe Stanford, PT DPT 11/18/2017, 3:38 PM  [facility] [address] [place], [address] Phone: [phone]   Fax:  [phone]  Name: Whitney [name] MRN: [id number] Date of Birth: [birth year]

## 2017-11-23 ENCOUNTER — Ambulatory Visit: Payer: Medicare Other

## 2017-11-23 DIAGNOSIS — M25562 Pain in left knee: Secondary | ICD-10-CM | POA: Diagnosis not present

## 2017-11-23 DIAGNOSIS — M25551 Pain in right hip: Secondary | ICD-10-CM

## 2017-11-23 DIAGNOSIS — R262 Difficulty in walking, not elsewhere classified: Secondary | ICD-10-CM | POA: Diagnosis not present

## 2017-11-23 NOTE — Therapy (Signed)
[facility] [address], [place], [address] Phone: [phone]   Fax:  [phone]  Physical Therapy Treatment  Patient Details  Name: Whitney [name] MRN: [id number] Date of Birth: 25-Mar-1951 Referring Provider: Kurtis Bushman MD   Encounter Date: 11/23/2017  PT End of Session - 11/23/17 1437    Visit Number  16    Number of Visits  28    Date for PT Re-Evaluation  12/16/17    Authorization Type  1 / 10 Prog Note   PT Start Time  7062    PT Stop Time  1515    PT Time Calculation (min)  44 min    Activity Tolerance  Patient tolerated treatment well    Behavior During Therapy  [facility] for tasks assessed/performed       Past Medical History:  Diagnosis Date  . Abnormal Pap smear of cervix    Pt states she had colposcopy   . Arthritis   . Chronic kidney disease   . Hx of unilateral nephrectomy 1979   Left Nephrectomy  . Hypertension   . PMB (postmenopausal bleeding)   . Restless leg syndrome   . Vitamin D deficiency     Past Surgical History:  Procedure Laterality Date  . HYSTEROSCOPY W/D&C N/A 02/16/2017   Procedure: DILATATION AND CURETTAGE /HYSTEROSCOPY;  Surgeon: Defrancesco, Alanda Slim, MD;  Location: [facility];  Service: Gynecology;  Laterality: N/A;  . KIDNEY SURGERY Left 1979   left kidney removed  . LEFT HEART CATH AND CORONARY ANGIOGRAPHY N/A 10/21/2017   Procedure: LEFT HEART CATH AND CORONARY ANGIOGRAPHY;  Surgeon: Teodoro Spray, MD;  Location: [facility];  Service: Cardiovascular;  Laterality: N/A;  . TOTAL KNEE ARTHROPLASTY Left 07/06/2017   Procedure: TOTAL KNEE ARTHROPLASTY;  Surgeon: Lovell Sheehan, MD;  Location: [facility];  Service: Orthopedics;  Laterality: Left;  . WRIST ARTHROSCOPY Left 1970s    There were no vitals filed for this visit.  Subjective Assessment - 11/23/17 1431    Subjective  Patient reports increased knee pain today along the L knee. Patient reports the  pain started yesterday.     Pertinent History  s/p TKA 07/06/2017; right knee arthritis with weakness as well with reports of right and left leg "giving way" intermittently    Limitations  Sitting;Walking;Standing;House hold activities;Other (comment)    How long can you sit comfortably?  forever    How long can you stand comfortably?  5 minutes    How long can you walk comfortably?  "not at all"    Diagnostic tests  imaging - see scanned docs    Patient Stated Goals  improve strength and motion left knee, walk without AD    Currently in Pain?  Yes    Pain Score  6     Pain Location  Knee    Pain Orientation  Left    Pain Descriptors / Indicators  Aching    Pain Type  Acute pain    Pain Onset  More than a month ago    Pain Frequency  Intermittent       TREATMENT: Therapeutic Exercise Seated ball roll outs on the L knee - x 8min  Seated SLR with 5# weight along the ankle - 2 x 15 B  Seated Knee flexion with GTB - 2 x 15 Seated hip abduction - 2 x 15 with legs straight     Patient demonstrates increased fatigue with exercise  PT Education - 11/23/17 1436    Education provided  Yes    Education Details  form/technique with exercise    Person(s) Educated  Patient    Methods  Explanation;Demonstration    Comprehension  Returned demonstration;Verbalized understanding          PT Long Term Goals - 11/18/17 1453      PT LONG TERM GOAL #1   Title  Pt will improve 10 MWS to .8 M/S to improve functional activity tolerance/gait    Baseline  15 seconds (.67 m/s and using rolling walker); .8 m/s with FWW 09/14/17; 10/14/17: .95    Status  Achieved      PT LONG TERM GOAL #2   Title  Pt will improve 10 MWS to 1.0 M/S to improve functional activity tolerance/gait    Baseline  15 seconds (.61m/s); 09/14/17: .82 m/s without AD; 10/14/17: .95; 11/18/17: 1 m/s    Status  Achieved      PT LONG TERM GOAL #3   Title  Patient will be able to ambulate independently with least restrictive AD  safely on level surfaces, uneven surfaces and curbs to improve functional tasks at home and community    Baseline  rolling walker, limited function at home and community; 09/14/17 requires use of FWW for long distances; 10/14/17: unable to amb over uneven surfaces    Status  Achieved      PT LONG TERM GOAL #4   Title  LEFS improved to 35/80 demonstrating improved self perceived disabiltiy with daily tasks     Baseline  LEFS 26/80; 09/14/17: defer until next visit; 10/14/2017: 50/80    Status  Achieved      PT LONG TERM GOAL #5   Title  LEFS improved to 45/80 or better demonstrating improved self perceived disabiltiy with daily tasks     Baseline  LEFS 26/80; 09/14/17: defer until next visit;10/14/17: .95    Status  Achieved      PT LONG TERM GOAL #6   Title  Patient will be independent with home program for ROM, strengthening exercises in order to transition to self managment once discharged from physical therapy    Baseline  requires guidance, verbal cues to perform all exercises; Requires cueing to perform walking with postural sway; 10/14/17: increased postural sway with EXercise    Time  6    Period  Weeks    Status  On-going      PT LONG TERM GOAL #7   Title  Patient will improve her 35minWT to >1055ft with least restrictive AD to improve ability to ambulate in the community without need for rest breaks.    Baseline  750 ft amb; 10/14/17: 1058ft with FWW    Time  6    Period  Weeks    Status  Achieved            Plan - 11/23/17 1448    Clinical Impression Statement  Patient's BP was 161/71mmHg today and performed decreased intensity exercises secondary to increased blood pressure. Educated patient to continueally take blood pressures at home to help monitor symptoms. Patient demonstrates improvement in knee pain after performing seated exercises indicating improvement in coordination and motor control. Patient will benefit from further skilled therapy to return to prior level of  function.     Clinical Impairments Affecting Rehab Potential  (+) motivated, acute condition (-) arthritis right knee with pain    PT Frequency  2x / week    PT Duration  6 weeks    PT Treatment/Interventions  Consulting civil engineer;Therapeutic activities;Therapeutic exercise;Balance training;Patient/family education;Neuromuscular re-education;Manual techniques    PT Next Visit Plan  exercise progression for ROM, strength    PT Home Exercise Plan  see patient education    Consulted and Agree with Plan of Care  Patient       Patient will benefit from skilled therapeutic intervention in order to improve the following deficits and impairments:  Pain, Decreased activity tolerance, Decreased endurance, Decreased range of motion, Decreased strength, Impaired perceived functional ability, Difficulty walking  Visit Diagnosis: Acute pain of left knee  Pain in right hip  Difficulty in walking, not elsewhere classified     Problem List Patient Active Problem List   Diagnosis Date Noted  . Essential hypertension 10/25/2017  . Mixed hyperlipidemia 10/25/2017  . Low back pain at multiple sites 10/25/2017  . Dysuria 10/25/2017  . Encounter for general adult medical examination with abnormal findings 10/25/2017  . Vitamin D deficiency 10/25/2017  . Near syncope   . Symptomatic bradycardia 10/19/2017  . Total knee replacement status, left 07/06/2017  . Chest pain 06/29/2017  . Postop check 02/25/2017  . Obesity (BMI 35.0-39.9 without comorbidity) 07/15/2016  . Endometrial polyp 07/15/2016  . Morbid obesity ([place]) 07/15/2016  . Family history of breast cancer in first degree relative 07/15/2016  . Family history of ovarian cancer 07/15/2016    Blythe Stanford, PT DPT 11/23/2017, 3:12 PM  [facility] [address], [place], [address] Phone: [phone]   Fax:   [phone]  Name: Whitney [name] MRN: [id number] Date of Birth: 08/12/1950

## 2017-11-25 DIAGNOSIS — M48062 Spinal stenosis, lumbar region with neurogenic claudication: Secondary | ICD-10-CM | POA: Diagnosis not present

## 2017-11-25 DIAGNOSIS — M5126 Other intervertebral disc displacement, lumbar region: Secondary | ICD-10-CM | POA: Diagnosis not present

## 2017-11-25 DIAGNOSIS — M5416 Radiculopathy, lumbar region: Secondary | ICD-10-CM | POA: Diagnosis not present

## 2017-11-26 ENCOUNTER — Ambulatory Visit: Payer: Self-pay | Admitting: Internal Medicine

## 2017-11-26 ENCOUNTER — Ambulatory Visit: Payer: Medicare Other

## 2017-11-26 DIAGNOSIS — M25562 Pain in left knee: Secondary | ICD-10-CM

## 2017-11-26 DIAGNOSIS — R262 Difficulty in walking, not elsewhere classified: Secondary | ICD-10-CM

## 2017-11-26 DIAGNOSIS — M25551 Pain in right hip: Secondary | ICD-10-CM

## 2017-11-26 NOTE — Therapy (Signed)
[facility] [address], [place], [address] Phone: [phone]   Fax:  [phone]  Physical Therapy Treatment  Patient Details  Name: Whitney [name] MRN: [id number] Date of Birth: 1951-03-30 Referring Provider: Kurtis Bushman MD   Encounter Date: 11/26/2017  PT End of Session - 11/26/17 1336    Visit Number  17    Number of Visits  28    Date for PT Re-Evaluation  12/16/17    PT Start Time  1300    PT Stop Time  1345    PT Time Calculation (min)  45 min    Activity Tolerance  Patient tolerated treatment well    Behavior During Therapy  [facility] for tasks assessed/performed       Past Medical History:  Diagnosis Date  . Abnormal Pap smear of cervix    Pt states she had colposcopy   . Arthritis   . Chronic kidney disease   . Hx of unilateral nephrectomy 1979   Left Nephrectomy  . Hypertension   . PMB (postmenopausal bleeding)   . Restless leg syndrome   . Vitamin D deficiency     Past Surgical History:  Procedure Laterality Date  . HYSTEROSCOPY W/D&C N/A 02/16/2017   Procedure: DILATATION AND CURETTAGE /HYSTEROSCOPY;  Surgeon: Defrancesco, Alanda Slim, MD;  Location: [facility];  Service: Gynecology;  Laterality: N/A;  . KIDNEY SURGERY Left 1979   left kidney removed  . LEFT HEART CATH AND CORONARY ANGIOGRAPHY N/A 10/21/2017   Procedure: LEFT HEART CATH AND CORONARY ANGIOGRAPHY;  Surgeon: Teodoro Spray, MD;  Location: [facility];  Service: Cardiovascular;  Laterality: N/A;  . TOTAL KNEE ARTHROPLASTY Left 07/06/2017   Procedure: TOTAL KNEE ARTHROPLASTY;  Surgeon: Lovell Sheehan, MD;  Location: [facility];  Service: Orthopedics;  Laterality: Left;  . WRIST ARTHROSCOPY Left 1970s    There were no vitals filed for this visit.  Subjective Assessment - 11/26/17 1320    Subjective  Patient reports no pain in the knee today but reports her BP has been elevated the past 3 days. Patient states she is  still prepared for discharged next week.     Pertinent History  s/p TKA 07/06/2017; right knee arthritis with weakness as well with reports of right and left leg "giving way" intermittently    Limitations  Sitting;Walking;Standing;House hold activities;Other (comment)    How long can you sit comfortably?  forever    How long can you stand comfortably?  5 minutes    How long can you walk comfortably?  "not at all"    Diagnostic tests  imaging - see scanned docs    Patient Stated Goals  improve strength and motion left knee, walk without AD    Currently in Pain?  No/denies    Pain Onset  More than a month ago         TREATMENT: Therapeutic Exercise Nustep in sitting with focus on improving speed - x 8 min  Step ups at 8" step without UE support - x 20  Leg Press at [facility] - x 20 95# Heel taps behind off of 8" step - x 12 performed B     Patient demonstrates increased fatigue with exercise      PT Education - 11/26/17 1336    Education provided  Yes    Education Details  form/technique with exercise    Person(s) Educated  Patient    Methods  Demonstration;Explanation  Comprehension  Verbalized understanding;Returned demonstration          PT Long Term Goals - 11/18/17 1453      PT LONG TERM GOAL #1   Title  Pt will improve 10 MWS to .8 M/S to improve functional activity tolerance/gait    Baseline  15 seconds (.67 m/s and using rolling walker); .8 m/s with FWW 09/14/17; 10/14/17: .95    Status  Achieved      PT LONG TERM GOAL #2   Title  Pt will improve 10 MWS to 1.0 M/S to improve functional activity tolerance/gait    Baseline  15 seconds (.58m/s); 09/14/17: .82 m/s without AD; 10/14/17: .95; 11/18/17: 1 m/s    Status  Achieved      PT LONG TERM GOAL #3   Title  Patient will be able to ambulate independently with least restrictive AD safely on level surfaces, uneven surfaces and curbs to improve functional tasks at home and community    Baseline  rolling walker, limited  function at home and community; 09/14/17 requires use of FWW for long distances; 10/14/17: unable to amb over uneven surfaces    Status  Achieved      PT LONG TERM GOAL #4   Title  LEFS improved to 35/80 demonstrating improved self perceived disabiltiy with daily tasks     Baseline  LEFS 26/80; 09/14/17: defer until next visit; 10/14/2017: 50/80    Status  Achieved      PT LONG TERM GOAL #5   Title  LEFS improved to 45/80 or better demonstrating improved self perceived disabiltiy with daily tasks     Baseline  LEFS 26/80; 09/14/17: defer until next visit;10/14/17: .95    Status  Achieved      PT LONG TERM GOAL #6   Title  Patient will be independent with home program for ROM, strengthening exercises in order to transition to self managment once discharged from physical therapy    Baseline  requires guidance, verbal cues to perform all exercises; Requires cueing to perform walking with postural sway; 10/14/17: increased postural sway with EXercise    Time  6    Period  Weeks    Status  On-going      PT LONG TERM GOAL #7   Title  Patient will improve her 26minWT to >1025ft with least restrictive AD to improve ability to ambulate in the community without need for rest breaks.    Baseline  750 ft amb; 10/14/17: 1081ft with FWW    Time  6    Period  Weeks    Status  Achieved            Plan - 11/26/17 1336    Clinical Impression Statement  Patient has a BP reading of 123/79 at the beginning of therapy and started secondary to appropriate blood pressure reading. Patient demonstrates adequate hip strength along the surgical L LE but demonstrates increased hip drop on the R side indicating poor stability/strength on the affected side. Patient will benefit from further skiled therapy focused on improving limitations to return to prior level of function.      Clinical Impairments Affecting Rehab Potential  (+) motivated, acute condition (-) arthritis right knee with pain    PT Frequency  2x / week     PT Duration  6 weeks    PT Treatment/Interventions  Electrical Stimulation;Cryotherapy;Moist Heat;Gait training;Stair training;Therapeutic activities;Therapeutic exercise;Balance training;Patient/family education;Neuromuscular re-education;Manual techniques    PT Next Visit Plan  exercise progression for ROM,  strength    PT Home Exercise Plan  see patient education    Consulted and Agree with Plan of Care  Patient       Patient will benefit from skilled therapeutic intervention in order to improve the following deficits and impairments:  Pain, Decreased activity tolerance, Decreased endurance, Decreased range of motion, Decreased strength, Impaired perceived functional ability, Difficulty walking  Visit Diagnosis: Acute pain of left knee  Pain in right hip  Difficulty in walking, not elsewhere classified     Problem List Patient Active Problem List   Diagnosis Date Noted  . Essential hypertension 10/25/2017  . Mixed hyperlipidemia 10/25/2017  . Low back pain at multiple sites 10/25/2017  . Dysuria 10/25/2017  . Encounter for general adult medical examination with abnormal findings 10/25/2017  . Vitamin D deficiency 10/25/2017  . Near syncope   . Symptomatic bradycardia 10/19/2017  . Total knee replacement status, left 07/06/2017  . Chest pain 06/29/2017  . Postop check 02/25/2017  . Obesity (BMI 35.0-39.9 without comorbidity) 07/15/2016  . Endometrial polyp 07/15/2016  . Morbid obesity ([place]) 07/15/2016  . Family history of breast cancer in first degree relative 07/15/2016  . Family history of ovarian cancer 07/15/2016    Blythe Stanford, PT DPT 11/26/2017, 2:54 PM  [facility] [address] [place], [address] Phone: [phone]   Fax:  [phone]  Name: Juliette Standre MRN: [id number] Date of Birth: [birth year]

## 2017-11-27 LAB — URINALYSIS, MICROSCOPIC (REFLEX): Bacteria, UA: NONE SEEN

## 2017-11-29 ENCOUNTER — Emergency Department
Admission: EM | Admit: 2017-11-29 | Discharge: 2017-11-29 | Disposition: A | Discharge status: Home / Self Care | Payer: Medicare Other | Attending: Emergency Medicine | Admitting: Emergency Medicine

## 2017-11-29 ENCOUNTER — Other Ambulatory Visit: Payer: Self-pay

## 2017-11-29 DIAGNOSIS — R55 Syncope and collapse: Secondary | ICD-10-CM | POA: Insufficient documentation

## 2017-11-29 DIAGNOSIS — N189 Chronic kidney disease, unspecified: Secondary | ICD-10-CM | POA: Insufficient documentation

## 2017-11-29 DIAGNOSIS — I129 Hypertensive chronic kidney disease with stage 1 through stage 4 chronic kidney disease, or unspecified chronic kidney disease: Secondary | ICD-10-CM | POA: Insufficient documentation

## 2017-11-29 DIAGNOSIS — R1030 Lower abdominal pain, unspecified: Secondary | ICD-10-CM | POA: Diagnosis not present

## 2017-11-29 DIAGNOSIS — R531 Weakness: Secondary | ICD-10-CM | POA: Diagnosis not present

## 2017-11-29 LAB — CBC
HCT: 39.8 % (ref 35.0–47.0)
Hemoglobin: 13.2 g/dL (ref 12.0–16.0)
MCH: 29.1 pg (ref 26.0–34.0)
MCHC: 33.2 g/dL (ref 32.0–36.0)
MCV: 87.5 fL (ref 80.0–100.0)
Platelets: 285 10*3/uL (ref 150–440)
RBC: 4.54 MIL/uL (ref 3.80–5.20)
RDW: 14.6 % — ABNORMAL HIGH (ref 11.5–14.5)
WBC: 6.1 10*3/uL (ref 3.6–11.0)

## 2017-11-29 LAB — BASIC METABOLIC PANEL
Anion gap: 8 (ref 5–15)
BUN: 21 mg/dL — ABNORMAL HIGH (ref 6–20)
CO2: 25 mmol/L (ref 22–32)
Calcium: 8.5 mg/dL — ABNORMAL LOW (ref 8.9–10.3)
Chloride: 108 mmol/L (ref 101–111)
Creatinine, Ser: 1.49 mg/dL — ABNORMAL HIGH (ref 0.44–1.00)
GFR calc Af Amer: 41 mL/min — ABNORMAL LOW (ref >60)
GFR calc non Af Amer: 35 mL/min — ABNORMAL LOW (ref >60)
Glucose, Bld: 143 mg/dL — ABNORMAL HIGH (ref 65–99)
Potassium: 4 mmol/L (ref 3.5–5.1)
Sodium: 141 mmol/L (ref 135–145)

## 2017-11-29 LAB — TROPONIN I
Troponin I: <0.03 ng/mL (ref <0.03)
Troponin I: <0.03 ng/mL (ref <0.03)

## 2017-11-29 MED ORDER — SODIUM CHLORIDE 0.9 % IV SOLN
1000.0000 mL | Freq: Once | INTRAVENOUS | Status: AC
  Administered 2017-11-29: 1000 mL via INTRAVENOUS

## 2017-11-29 NOTE — ED Notes (Signed)
Pt signed paper copy of discharge 

## 2017-11-29 NOTE — ED Provider Notes (Signed)
[facility] Emergency Department Provider Note       Time seen: ----------------------------------------- 3:53 PM on 11/29/2017 -----------------------------------------   I have reviewed the triage vital signs and the nursing notes.  HISTORY   Chief Complaint Loss of Consciousness    HPI Whitney [name] is a 66 y.o. female with a history of chronic kidney disease, hypertension, restless leg syndrome who presents to the ED for near syncope.  Patient arrives by EMS with a feeling of near syncope, she states she gets a feeling in the pit of her stomach and then feels like she cannot pass out.  She states she was just recently admitted into the hospital for this but there is no specific diagnosis given.  Patient states she gets abdominal pain and gets nauseous and feels like she can a pass out.  She denies fevers, chills, chest pain, shortness of breath, diarrhea but has had nausea.  She has eaten normally today, after she was admitted she states some of her medications were changed.  Past Medical History:  Diagnosis Date  . Abnormal Pap smear of cervix    Pt states she had colposcopy   . Arthritis   . Chronic kidney disease   . Hx of unilateral nephrectomy 1979   Left Nephrectomy  . Hypertension   . PMB (postmenopausal bleeding)   . Restless leg syndrome   . Vitamin D deficiency     Patient Active Problem List   Diagnosis Date Noted  . Essential hypertension 10/25/2017  . Mixed hyperlipidemia 10/25/2017  . Low back pain at multiple sites 10/25/2017  . Dysuria 10/25/2017  . Encounter for general adult medical examination with abnormal findings 10/25/2017  . Vitamin D deficiency 10/25/2017  . Near syncope   . Symptomatic bradycardia 10/19/2017  . Total knee replacement status, left 07/06/2017  . Chest pain 06/29/2017  . Postop check 02/25/2017  . Obesity (BMI 35.0-39.9 without comorbidity) 07/15/2016  . Endometrial polyp 07/15/2016  . Morbid obesity  ([place]) 07/15/2016  . Family history of breast cancer in first degree relative 07/15/2016  . Family history of ovarian cancer 07/15/2016    Past Surgical History:  Procedure Laterality Date  . HYSTEROSCOPY W/D&C N/A 02/16/2017   Procedure: DILATATION AND CURETTAGE /HYSTEROSCOPY;  Surgeon: Defrancesco, Alanda Slim, MD;  Location: [facility] ORS;  Service: Gynecology;  Laterality: N/A;  . KIDNEY SURGERY Left 1979   left kidney removed  . LEFT HEART CATH AND CORONARY ANGIOGRAPHY N/A 10/21/2017   Procedure: LEFT HEART CATH AND CORONARY ANGIOGRAPHY;  Surgeon: Teodoro Spray, MD;  Location: [place] [facility];  Service: Cardiovascular;  Laterality: N/A;  . TOTAL KNEE ARTHROPLASTY Left 07/06/2017   Procedure: TOTAL KNEE ARTHROPLASTY;  Surgeon: Lovell Sheehan, MD;  Location: [facility] ORS;  Service: Orthopedics;  Laterality: Left;  . WRIST ARTHROSCOPY Left 1970s    Allergies Enalapril and Lisinopril  Social History Social History   Tobacco Use  . Smoking status: Never Smoker  . Smokeless tobacco: Never Used  Substance Use Topics  . Alcohol use: Yes    Comment: occasionally  . Drug use: Yes    Frequency: 3.0 times per week    Types: Marijuana, Cocaine    Comment: Pt states she uses for leg pain: marijuana; states no longer uses cocaine   Review of Systems Constitutional: Negative for fever. Cardiovascular: Negative for chest pain. Respiratory: Negative for shortness of breath. Gastrointestinal: Negative for abdominal pain, positive for nausea Musculoskeletal: Negative for back pain. Skin: Negative for rash.  Neurological: Positive for weakness  All systems negative/normal/unremarkable except as stated in the HPI  ____________________________________________   PHYSICAL EXAM:  VITAL SIGNS: ED Triage Vitals [11/29/17 1551]  Enc Vitals Group     BP (!) 145/88     Pulse Rate 86     Resp 16     Temp 98 F (36.7 C)     Temp Source Oral     SpO2 97 %     Weight 238 lb (108 kg)     Height  5\' 5"  (1.651 m)     Head Circumference      Peak Flow      Pain Score 0     Pain Loc      Pain Edu?      Excl. in [place]?    Constitutional: Alert and oriented. Obese, no distress Eyes: Conjunctivae are normal. Normal extraocular movements. ENT   Head: Normocephalic and atraumatic.   Nose: No congestion/rhinnorhea.   Mouth/Throat: Mucous membranes are moist.   Neck: No stridor. Cardiovascular: Normal rate, regular rhythm. No murmurs, rubs, or gallops. Respiratory: Normal respiratory effort without tachypnea nor retractions. Breath sounds are clear and equal bilaterally. No wheezes/rales/rhonchi. Gastrointestinal: Soft and nontender. Normal bowel sounds Musculoskeletal: Nontender with normal range of motion in extremities. No lower extremity tenderness nor edema. Neurologic:  Normal speech and language. No gross focal neurologic deficits are appreciated.  Skin:  Skin is warm, dry and intact. No rash noted. Psychiatric: Mood and affect are normal. Speech and behavior are normal.  ____________________________________________  EKG: Interpreted by me.  Sinus rhythm rate 88 bpm, normal PR interval, normal QRS, normal QT.  ____________________________________________  ED COURSE:  As part of my medical decision making, I reviewed the following data within the [place] History obtained from family if available, nursing notes, old chart and ekg, as well as notes from prior ED visits. Patient presented for near syncope, we will assess with labs and imaging as indicated at this time.   Procedures ____________________________________________   LABS (pertinent positives/negatives)  Labs Reviewed  BASIC METABOLIC PANEL - Abnormal; Notable for the following components:      Result Value   Glucose, Bld 143 (*)    BUN 21 (*)    Creatinine, Ser 1.49 (*)    Calcium 8.5 (*)    GFR calc non Af Amer 35 (*)    GFR calc Af Amer 41 (*)    All other components within  normal limits  CBC - Abnormal; Notable for the following components:   RDW 14.6 (*)    All other components within normal limits  TROPONIN I  TROPONIN I  URINALYSIS, COMPLETE (UACMP) WITH MICROSCOPIC  CBG MONITORING, ED  CBG MONITORING, ED   ____________________________________________  DIFFERENTIAL DIAGNOSIS   Dehydration, electrolyte abnormality, medication side effect, gastritis  FINAL ASSESSMENT AND PLAN  Near syncope   Plan: The patient had presented for near syncope. Patient's labs did reveal slight worsening of her creatinine for which she was given fluids. Repeat troponin was negative. She looks well now, does not feel near syncopal. I will recommend close outpatient follow up.    Laurence Aly, MD   Note: This note was generated in part or whole with voice recognition software. Voice recognition is usually quite accurate but there are transcription errors that can and very often do occur. I apologize for any typographical errors that were not detected and corrected.     Earleen Newport, MD 11/29/17 [phone]

## 2017-11-29 NOTE — ED Triage Notes (Signed)
Pt arrived via ems for c/o "passing out" - pt states that she has had this before - she gets abd pain and then gets nauseated and then feels like she is going to pass out - Dr Jimmye Norman at bedside

## 2017-11-30 ENCOUNTER — Ambulatory Visit: Payer: Medicare Other

## 2017-12-01 ENCOUNTER — Other Ambulatory Visit (INDEPENDENT_AMBULATORY_CARE_PROVIDER_SITE_OTHER): Payer: Medicare Other | Admitting: Internal Medicine

## 2017-12-01 DIAGNOSIS — G4733 Obstructive sleep apnea (adult) (pediatric): Secondary | ICD-10-CM | POA: Diagnosis not present

## 2017-12-01 DIAGNOSIS — I482 Chronic atrial fibrillation, unspecified: Secondary | ICD-10-CM

## 2017-12-03 ENCOUNTER — Ambulatory Visit: Payer: Medicare Other | Attending: Orthopedic Surgery

## 2017-12-03 DIAGNOSIS — M25551 Pain in right hip: Secondary | ICD-10-CM | POA: Diagnosis present

## 2017-12-03 DIAGNOSIS — M25562 Pain in left knee: Secondary | ICD-10-CM

## 2017-12-03 DIAGNOSIS — R262 Difficulty in walking, not elsewhere classified: Secondary | ICD-10-CM | POA: Insufficient documentation

## 2017-12-03 NOTE — Therapy (Signed)
[facility] [address] [place], [address] Phone: [phone]   Fax:  [phone]  Physical Therapy Treatment/Discharge Summary  Patient Details  Name: Whitney [name] MRN: [id number] Date of Birth: [birth year] Referring Provider: Kurtis Bushman MD   Encounter Date: 12/03/2017  PT End of Session - 12/03/17 1415    Visit Number  18    Number of Visits  28    Date for PT Re-Evaluation  12/16/17    PT Start Time  1300    PT Stop Time  1320    PT Time Calculation (min)  20 min    Activity Tolerance  Patient tolerated treatment well    Behavior During Therapy  [facility] for tasks assessed/performed       Past Medical History:  Diagnosis Date  . Abnormal Pap smear of cervix    Pt states she had colposcopy   . Arthritis   . Chronic kidney disease   . Hx of unilateral nephrectomy 1979   Left Nephrectomy  . Hypertension   . PMB (postmenopausal bleeding)   . Restless leg syndrome   . Vitamin D deficiency     Past Surgical History:  Procedure Laterality Date  . HYSTEROSCOPY W/D&C N/A 02/16/2017   Procedure: DILATATION AND CURETTAGE /HYSTEROSCOPY;  Surgeon: Defrancesco, Alanda Slim, MD;  Location: [facility] ORS;  Service: Gynecology;  Laterality: N/A;  . KIDNEY SURGERY Left 1979   left kidney removed  . LEFT HEART CATH AND CORONARY ANGIOGRAPHY N/A 10/21/2017   Procedure: LEFT HEART CATH AND CORONARY ANGIOGRAPHY;  Surgeon: Teodoro Spray, MD;  Location: [place] [facility];  Service: Cardiovascular;  Laterality: N/A;  . TOTAL KNEE ARTHROPLASTY Left 07/06/2017   Procedure: TOTAL KNEE ARTHROPLASTY;  Surgeon: Lovell Sheehan, MD;  Location: [facility] ORS;  Service: Orthopedics;  Laterality: Left;  . WRIST ARTHROSCOPY Left 1970s    There were no vitals filed for this visit.  Subjective Assessment - 12/03/17 1326    Subjective  Patient reports she has been having increased stomach pain with nasuea. Patient states she is preaprd  for discharge today but does not want to do many exercises secondary to nausea.     Pertinent History  s/p TKA 07/06/2017; right knee arthritis with weakness as well with reports of right and left leg "giving way" intermittently    Limitations  Sitting;Walking;Standing;House hold activities;Other (comment)    How long can you sit comfortably?  forever    How long can you stand comfortably?  5 minutes    How long can you walk comfortably?  "not at all"    Diagnostic tests  imaging - see scanned docs    Patient Stated Goals  improve strength and motion left knee, walk without AD    Currently in Pain?  No/denies    Pain Onset  More than a month ago       TREATMENT  Therapeutic Exercise Reviewed all exercises given in HEP: Step ups Hip Abduction in standing Hip Extension in standing  Side stepping with band around knees SLS Tandem stance Leg Press Squats in standing  Bridges in standing   Patient demonstrates good understanding of exercises at the end of the session     PT Education - 12/03/17 1332    Education provided  Yes    Education Details  form/technique with exercises    Person(s) Educated  Patient    Methods  Explanation;Demonstration    Comprehension  Verbalized understanding;Returned demonstration  PT Long Term Goals - 12/03/17 1419      PT LONG TERM GOAL #1   Title  Pt will improve 10 MWS to .8 M/S to improve functional activity tolerance/gait    Baseline  15 seconds (.67 m/s and using rolling walker); .8 m/s with FWW 09/14/17; 10/14/17: .95    Status  Achieved      PT LONG TERM GOAL #2   Title  Pt will improve 10 MWS to 1.0 M/S to improve functional activity tolerance/gait    Baseline  15 seconds (.31ms); 09/14/17: .82 m/s without AD; 10/14/17: .95; 11/18/17: 1 m/s    Status  Achieved      PT LONG TERM GOAL #3   Title  Patient will be able to ambulate independently with least restrictive AD safely on level surfaces, uneven surfaces and curbs to  improve functional tasks at home and community    Baseline  rolling walker, limited function at home and community; 09/14/17 requires use of FWW for long distances; 10/14/17: unable to amb over uneven surfaces    Status  Achieved      PT LONG TERM GOAL #4   Title  LEFS improved to 35/80 demonstrating improved self perceived disabiltiy with daily tasks     Baseline  LEFS 26/80; 09/14/17: defer until next visit; 10/14/2017: 50/80    Status  Achieved      PT LONG TERM GOAL #5   Title  LEFS improved to 45/80 or [name] demonstrating improved self perceived disabiltiy with daily tasks     Baseline  LEFS 26/80; 09/14/17: defer until next visit;10/14/17: .95    Status  Achieved      PT LONG TERM GOAL #6   Title  Patient will be independent with home program for ROM, strengthening exercises in order to transition to self managment once discharged from physical therapy    Baseline  requires guidance, verbal cues to perform all exercises; Requires cueing to perform walking with postural sway; 10/14/17: increased postural sway with EXercise    Time  6    Period  Weeks    Status  Achieved      PT LONG TERM GOAL #7   Title  Patient will improve her 673mWT to >100073fith least restrictive AD to improve ability to ambulate in the community without need for rest breaks.    Baseline  750 ft amb; 10/14/17: [id number] FWW    Time  6    Period  Weeks    Status  Achieved            Plan - 12/03/17 1415    Clinical Impression Statement  Patient has a BP of 125/85 and continued to reviewing HEP for home and lightly performing the exercises to chech technique. Patient demonstrates good understnading of exercises and is able to reproduce technique and exercises with picture cueing. Patient has met all long term goals and is to be discharged from physical therapy.    Clinical Impairments Affecting Rehab Potential  (+) motivated, acute condition (-) arthritis right knee with pain    PT Frequency  2x / week     PT Duration  6 weeks    PT Treatment/Interventions  Electrical Stimulation;Cryotherapy;Moist Heat;Gait training;Stair training;Therapeutic activities;Therapeutic exercise;Balance training;Patient/family education;Neuromuscular re-education;Manual techniques    PT Next Visit Plan  exercise progression for ROM, strength    PT Home Exercise Plan  see patient education    Consulted and Agree with Plan of Care  Patient  Patient will benefit from skilled therapeutic intervention in order to improve the following deficits and impairments:  Pain, Decreased activity tolerance, Decreased endurance, Decreased range of motion, Decreased strength, Impaired perceived functional ability, Difficulty walking  Visit Diagnosis: Acute pain of left knee  Pain in right hip  Difficulty in walking, not elsewhere classified     Problem List Patient Active Problem List   Diagnosis Date Noted  . Essential hypertension 10/25/2017  . Mixed hyperlipidemia 10/25/2017  . Low back pain at multiple sites 10/25/2017  . Dysuria 10/25/2017  . Encounter for general adult medical examination with abnormal findings 10/25/2017  . Vitamin D deficiency 10/25/2017  . Near syncope   . Symptomatic bradycardia 10/19/2017  . Total knee replacement status, left 07/06/2017  . Chest pain 06/29/2017  . Postop check 02/25/2017  . Obesity (BMI 35.0-39.9 without comorbidity) 07/15/2016  . Endometrial polyp 07/15/2016  . Morbid obesity ([place]) 07/15/2016  . Family history of breast cancer in first degree relative 07/15/2016  . Family history of ovarian cancer 07/15/2016    Blythe Stanford, PT DPT 12/03/2017, 2:20 PM  [facility] [address], [place], [address] Phone: [phone]   Fax:  [phone]  Name: Antonela Freiman MRN: [id number] Date of Birth: 27-Sep-1950

## 2017-12-07 DIAGNOSIS — M5126 Other intervertebral disc displacement, lumbar region: Secondary | ICD-10-CM | POA: Diagnosis not present

## 2017-12-07 DIAGNOSIS — M48062 Spinal stenosis, lumbar region with neurogenic claudication: Secondary | ICD-10-CM | POA: Diagnosis not present

## 2017-12-07 DIAGNOSIS — M5416 Radiculopathy, lumbar region: Secondary | ICD-10-CM | POA: Diagnosis not present

## 2017-12-16 ENCOUNTER — Ambulatory Visit (INDEPENDENT_AMBULATORY_CARE_PROVIDER_SITE_OTHER): Payer: Medicare Other | Admitting: Obstetrics and Gynecology

## 2017-12-16 ENCOUNTER — Other Ambulatory Visit: Payer: Self-pay | Admitting: Obstetrics and Gynecology

## 2017-12-16 ENCOUNTER — Encounter: Payer: Self-pay | Admitting: Obstetrics and Gynecology

## 2017-12-16 VITALS — BP 179/81 | HR 46 | Ht 65.0 in | Wt 248.3 lb

## 2017-12-16 DIAGNOSIS — B373 Candidiasis of vulva and vagina: Secondary | ICD-10-CM

## 2017-12-16 DIAGNOSIS — N950 Postmenopausal bleeding: Secondary | ICD-10-CM

## 2017-12-16 DIAGNOSIS — E669 Obesity, unspecified: Secondary | ICD-10-CM

## 2017-12-16 DIAGNOSIS — N898 Other specified noninflammatory disorders of vagina: Secondary | ICD-10-CM

## 2017-12-16 DIAGNOSIS — B3731 Acute candidiasis of vulva and vagina: Secondary | ICD-10-CM

## 2017-12-16 MED ORDER — NYSTATIN-TRIAMCINOLONE 100000-0.1 UNIT/GM-% EX OINT: 1.0000 "application " | TOPICAL_OINTMENT | Freq: Two times a day (BID) | CUTANEOUS | 0 refills | Status: DC

## 2017-12-16 MED ORDER — FLUCONAZOLE 150 MG PO TABS: 150.0000 mg | ORAL_TABLET | ORAL | 0 refills | Status: DC

## 2017-12-16 NOTE — Progress Notes (Signed)
Chief complaint: 1.  Postmenopausal bleeding 2.  Vulvar/vaginal itching 3.  Vaginal discharge  Whitney [name] presents today for evaluation of the above complaints.  She got a steroid injection on May 5.  Shortly thereafter she had onset of postmenopausal bleeding for several days along with a vaginal discharge which was malodorous and associated with vaginal and vulvar itching.  She has not self treated for any condition.  She is not actively bleeding at this time.  She denies pelvic pain.  Past history is notable for endometrial hyperplasia, previously treated with Megace.  Subsequent post treatment biopsy revealed no hyperplasia.  Past Medical History:  Diagnosis Date  . Abnormal Pap smear of cervix    Pt states she had colposcopy   . Arthritis   . Chronic kidney disease   . Hx of unilateral nephrectomy 1979   Left Nephrectomy  . Hypertension   . PMB (postmenopausal bleeding)   . Restless leg syndrome   . Vitamin D deficiency    Past Surgical History:  Procedure Laterality Date  . HYSTEROSCOPY W/D&C N/A 02/16/2017   Procedure: DILATATION AND CURETTAGE /HYSTEROSCOPY;  Surgeon: Trish Mancinelli, Alanda Slim, MD;  Location: [facility];  Service: Gynecology;  Laterality: N/A;  . KIDNEY SURGERY Left 1979   left kidney removed  . LEFT HEART CATH AND CORONARY ANGIOGRAPHY N/A 10/21/2017   Procedure: LEFT HEART CATH AND CORONARY ANGIOGRAPHY;  Surgeon: Teodoro Spray, MD;  Location: [place] [facility];  Service: Cardiovascular;  Laterality: N/A;  . TOTAL KNEE ARTHROPLASTY Left 07/06/2017   Procedure: TOTAL KNEE ARTHROPLASTY;  Surgeon: Lovell Sheehan, MD;  Location: [facility];  Service: Orthopedics;  Laterality: Left;  . WRIST ARTHROSCOPY Left 1970s    Comprehensive review of systems is negative except for that noted in the HPI  OBJECTIVE: BP (!) 179/81   Pulse (!) 46   Ht 5\' 5"  (1.651 m)   Wt 248 lb 4.8 oz (112.6 kg)   LMP 01/26/2017   BMI 41.32 kg/m  Pleasant well-appearing female no acute  distress.  Alert and oriented. Abdomen: Soft, nontender Pelvic exam: External genitalia-mild hyperemia BUS-normal Vagina-thin white secretions in vaginal vault; no blood present; no malodor Cervix-no lesions; no cervical motion tenderness Uterus-midplane, nontender, size difficult to assess due to body habitus Adnexa-nonpalpable nontender Rectovaginal-normal external exam  PROCEDURE:Endometrial Biopsy Procedure Note  Pre-operative Diagnosis: Postmenopausal bleeding  Post-operative Diagnosis: same  Indications: postmenopausal bleeding  Procedure Details   Urine pregnancy test was not done.  The risks (including infection, bleeding, pain, and uterine perforation) and benefits of the procedure were explained to the patient and Verbal informed consent was obtained.  Antibiotic prophylaxis against endocarditis was not indicated.   The patient was placed in the dorsal lithotomy position.  Bimanual exam showed the uterus to be in the neutral position.  A Graves' speculum inserted in the vagina.  Endocervical curettage with a Kevorkian curette was not performed.   A sharp tenaculum was not applied to the anterior lip of the cervix for stabilization.  A sterile uterine sound was used to sound the uterus to a depth of 9.5cm.  A Mylex 51mm curette was used to sample the endometrium.  Sample was sent for pathologic examination.  The specimen was scant  Condition: Stable  Complications: None  Plan:  The patient was advised to call for any fever or for prolonged or severe pain or bleeding. She was advised to use OTC acetaminophen and OTC ibuprofen as needed for mild to moderate pain. She was advised  to avoid vaginal intercourse for 48 hours or until the bleeding has completely stopped.  Attending Physician Documentation: Brayton Mars, MD   PROCEDURE: Wet prep Normal saline-few white blood cells; no trichomonas; no clue cells KOH-budding yeast present  ASSESSMENT: 1.   Postmenopausal bleeding 2.  History of endometrial hyperplasia, status post Megace therapy 3.  Monilia vulvovaginitis  PLAN: 1.  Wet prep as noted 2.  Endometrial biopsy as noted 3.  Diflucan 150 mg orally every 3 days x 3 doses 4.  Nystatin/triamcinolone cream to be applied topically to the vulva twice a day for 10 to 14 days 5.  Results of endometrial biopsy will be made available along with further management planning 6.  Ultrasound is not ordered due to the patient's travel itinerary in the very near future precluding radiology assessment  A total of 15 minutes were spent face-to-face with the patient during this encounter and over half of that time dealt with counseling and coordination of care.  Brayton Mars, MD  Note: This dictation was prepared with Dragon dictation along with smaller phrase technology. Any transcriptional errors that result from this process are unintentional.   ASSESSMENT:

## 2017-12-16 NOTE — Patient Instructions (Signed)
1.  Wet prep today demonstrates monilia vaginitis 2.  Diflucan 150 mg orally every 3 days for 3 doses 3.  Nystatin/triamcinolone cream is to be applied topically to the vulva twice a day for 10 to 14 days 4.  Endometrial biopsy was performed today to assess reason for postmenopausal bleeding 5.  Results will be made available along with further management planning following pathology review

## 2017-12-18 ENCOUNTER — Ambulatory Visit (INDEPENDENT_AMBULATORY_CARE_PROVIDER_SITE_OTHER): Payer: Medicare Other | Admitting: Nurse Practitioner

## 2017-12-18 ENCOUNTER — Encounter: Payer: Self-pay | Admitting: Nurse Practitioner

## 2017-12-18 VITALS — BP 149/108 | HR 88 | Resp 16 | Ht 65.0 in | Wt 249.4 lb

## 2017-12-18 DIAGNOSIS — I10 Essential (primary) hypertension: Secondary | ICD-10-CM | POA: Diagnosis not present

## 2017-12-18 DIAGNOSIS — K862 Cyst of pancreas: Secondary | ICD-10-CM

## 2017-12-18 DIAGNOSIS — N2889 Other specified disorders of kidney and ureter: Secondary | ICD-10-CM | POA: Diagnosis not present

## 2017-12-18 DIAGNOSIS — R55 Syncope and collapse: Secondary | ICD-10-CM | POA: Diagnosis not present

## 2017-12-18 DIAGNOSIS — F5101 Primary insomnia: Secondary | ICD-10-CM | POA: Diagnosis not present

## 2017-12-18 DIAGNOSIS — R1084 Generalized abdominal pain: Secondary | ICD-10-CM | POA: Diagnosis not present

## 2017-12-18 MED ORDER — TRAZODONE HCL 50 MG PO TABS: 50.0000 mg | ORAL_TABLET | Freq: Every evening | ORAL | 3 refills | Status: DC | PRN

## 2017-12-18 NOTE — Progress Notes (Signed)
[facility] [place], [place] [address]  Internal MEDICINE  Office Visit Note  Patient Name: Whitney [name]  [id number]  [id number]  Date of Service: 01/11/2018  Pt is here for recent hospital follow up.   Chief Complaint  Patient presents with  . Near Syncope    hospital follow up  . Sleeping Problem    havent been bale to sleep complications since falling out  . stomach problems    have knots that cause fainting and skin to crawl. been going on for 90yrs     Near Syncope  This is a recurrent problem. The current episode started in the past 7 days. The problem occurs intermittently. The problem has been unchanged. Associated symptoms include abdominal pain, arthralgias, fatigue, myalgias, nausea and weakness. Pertinent negatives include no chest pain, chills, congestion, coughing, joint swelling, neck pain, numbness, rash, sore throat or vomiting. The symptoms are aggravated by bending. She has tried eating and drinking for the symptoms. The treatment provided mild relief.     Current Medication: Outpatient Encounter Medications as of 12/18/2017  Medication Sig  . acetaminophen (TYLENOL) 325 MG tablet Take 650 mg by mouth 4 (four) times daily as needed.   Marland Kitchen aspirin EC 81 MG tablet Take 1 tablet (81 mg total) by mouth daily.  Marland Kitchen atorvastatin (LIPITOR) 40 MG tablet Take 1 tablet (40 mg total) by mouth daily at 6 PM.  . carvedilol (COREG) 3.125 MG tablet Take 1 tablet (3.125 mg total) by mouth 2 (two) times daily with a meal.  . diclofenac sodium (VOLTAREN) 1 % GEL   . flecainide (TAMBOCOR) 50 MG tablet Take 1 tablet (50 mg total) by mouth every 12 (twelve) hours.  . fluconazole (DIFLUCAN) 150 MG tablet Take 1 tablet (150 mg total) by mouth every 3 (three) days.  Marland Kitchen gabapentin (NEURONTIN) 100 MG capsule Take 1 capsule (100 mg total) by mouth 2 (two) times daily.  Marland Kitchen nystatin-triamcinolone ointment (MYCOLOG) Apply 1 application topically 2 (two) times daily.  .  traMADol (ULTRAM) 50 MG tablet Take by mouth.  . [DISCONTINUED] hydrALAZINE (APRESOLINE) 10 MG tablet Take 1 tablet (10 mg total) by mouth 3 (three) times daily.  . [DISCONTINUED] rOPINIRole (REQUIP) 0.5 MG tablet Take 0.5 mg at bedtime by mouth. Takes 1 or 2 tablets as needed  . [DISCONTINUED] traZODone (DESYREL) 50 MG tablet Take 1 tablet (50 mg total) by mouth at bedtime as needed for sleep.  . [DISCONTINUED] traZODone (DESYREL) 50 MG tablet Take 1 tablet (50 mg total) by mouth at bedtime as needed for sleep.   No facility-administered encounter medications on file as of 12/18/2017.     Surgical History: Past Surgical History:  Procedure Laterality Date  . HYSTEROSCOPY W/D&C N/A 02/16/2017   Procedure: DILATATION AND CURETTAGE /HYSTEROSCOPY;  Surgeon: Defrancesco, Alanda Slim, MD;  Location: [facility];  Service: Gynecology;  Laterality: N/A;  . KIDNEY SURGERY Left 1979   left kidney removed  . LEFT HEART CATH AND CORONARY ANGIOGRAPHY N/A 10/21/2017   Procedure: LEFT HEART CATH AND CORONARY ANGIOGRAPHY;  Surgeon: Teodoro Spray, MD;  Location: [facility];  Service: Cardiovascular;  Laterality: N/A;  . TOTAL KNEE ARTHROPLASTY Left 07/06/2017   Procedure: TOTAL KNEE ARTHROPLASTY;  Surgeon: Lovell Sheehan, MD;  Location: [facility];  Service: Orthopedics;  Laterality: Left;  . WRIST ARTHROSCOPY Left 1970s    Medical History: Past Medical History:  Diagnosis Date  . Abnormal Pap smear of cervix    Pt  states she had colposcopy   . Arthritis   . Chronic kidney disease   . Hx of unilateral nephrectomy 1979   Left Nephrectomy  . Hypertension   . PMB (postmenopausal bleeding)   . Restless leg syndrome   . Vitamin D deficiency     Family History: Family History  Problem Relation Age of Onset  . Ovarian cancer Mother   . Hypertension Father   . Diabetes Father   . Cancer Father   . Breast cancer Sister   . Diabetes Sister     Social History   Socioeconomic History  .  Marital status: Divorced    Spouse name: Not on file  . Number of children: Not on file  . Years of education: Not on file  . Highest education level: Not on file  Occupational History  . Not on file  Social Needs  . Financial resource strain: Not on file  . Food insecurity:    Worry: Not on file    Inability: Not on file  . Transportation needs:    Medical: Not on file    Non-medical: Not on file  Tobacco Use  . Smoking status: Never Smoker  . Smokeless tobacco: Never Used  Substance and Sexual Activity  . Alcohol use: Yes    Comment: occasionally  . Drug use: Yes    Frequency: 3.0 times per week    Types: Marijuana, Cocaine    Comment: Pt states she uses for leg pain: marijuana; states no longer uses cocaine  . Sexual activity: Yes    Birth control/protection: None  Lifestyle  . Physical activity:    Days per week: Not on file    Minutes per session: Not on file  . Stress: Not on file  Relationships  . Social connections:    Talks on phone: Not on file    Gets together: Not on file    Attends religious service: Not on file    Active member of club or organization: Not on file    Attends meetings of clubs or organizations: Not on file    Relationship status: Not on file  . Intimate partner violence:    Fear of current or ex partner: Not on file    Emotionally abused: Not on file    Physically abused: Not on file    Forced sexual activity: Not on file  Other Topics Concern  . Not on file  Social History Narrative  . Not on file      Review of Systems  Constitutional: Positive for activity change and fatigue. Negative for chills and unexpected weight change.  HENT: Negative for congestion, postnasal drip, rhinorrhea, sneezing and sore throat.   Eyes: Negative.  Negative for redness.  Respiratory: Negative for cough, chest tightness, shortness of breath and wheezing.   Cardiovascular: Positive for palpitations and near-syncope. Negative for chest pain.   Gastrointestinal: Positive for abdominal pain, diarrhea and nausea. Negative for constipation and vomiting.  Endocrine: Negative for cold intolerance, heat intolerance, polydipsia and polyuria.  Genitourinary: Negative for dysuria and frequency.  Musculoskeletal: Positive for arthralgias, back pain and myalgias. Negative for joint swelling and neck pain.  Skin: Negative for rash.  Allergic/Immunologic: Positive for environmental allergies.  Neurological: Positive for weakness. Negative for tremors and numbness.  Hematological: Negative for adenopathy. Does not bruise/bleed easily.  Psychiatric/Behavioral: Negative for behavioral problems (Depression), sleep disturbance and suicidal ideas. The patient is nervous/anxious.     Vital Signs: BP (!) 149/108 (BP Location: Right  Arm, Patient Position: Sitting, Cuff Size: Large)   Pulse 88   Resp 16   Ht 5\' 5"  (1.651 m)   Wt 249 lb 6.4 oz (113.1 kg)   LMP 01/26/2017   SpO2 94%   BMI 41.50 kg/m    Physical Exam  Constitutional: She is oriented to person, place, and time. She appears well-developed and well-nourished. No distress.  HENT:  Head: Normocephalic and atraumatic.  Mouth/Throat: Oropharynx is clear and moist. No oropharyngeal exudate.  Eyes: Pupils are equal, round, and reactive to light. Conjunctivae and EOM are normal.  Neck: Normal range of motion. Neck supple. No JVD present. No tracheal deviation present. No thyromegaly present.  Cardiovascular: Normal rate, regular rhythm and normal heart sounds. Exam reveals no gallop and no friction rub.  No murmur heard. Pulmonary/Chest: Effort normal and breath sounds normal. No respiratory distress. She has no wheezes. She has no rales. She exhibits no tenderness.  Abdominal: Soft. Bowel sounds are normal. There is tenderness.  Musculoskeletal: Normal range of motion. She exhibits tenderness and deformity.  Knee   Lymphadenopathy:    She has no cervical adenopathy.  Neurological: She  is alert and oriented to person, place, and time. No cranial nerve deficit.  Skin: Skin is warm and dry. She is not diaphoretic.  Psychiatric: She has a normal mood and affect. Her behavior is normal. Judgment and thought content normal.  Nursing note and vitals reviewed.  Assessment/Plan:  1. Generalized abdominal pain Reviewed CT of abdomen which did show possible pancreatic cyst, as well as uterine fibroid. No findings noted to explain patient's abdominal pain. Will get MRI of the abdomen for further evaluation.   2. Cyst of pancreas - MR Abdomen W Wo Contrast; Future  3. Other specified disorders of kidney and ureter Cyst-like lesions on kidneys. MRI of abdomen ordered for further evaluation.  - MR Abdomen W Wo Contrast; Future  4. Near syncope Reviewed MRI of the brain. Showed chronic microvascular changes without acute abnormalities.   5. Essential hypertension Stable. Continue bp medication as prescribed  6. Primary insomnia Trazodone as needed and as prescribed.   General Counseling: kippy melena understanding of the findings of todays visit and agrees with plan of treatment. I have discussed any further diagnostic evaluation that may be needed or ordered today. We also reviewed her medications today. she has been encouraged to call the office with any questions or concerns that should arise related to todays visit.    Counseling:   This patient was seen by Leretha Pol, FNP- C in Collaboration with Dr Lavera Guise as a part of collaborative care agreement  Orders Placed This Encounter  Procedures  . MR Abdomen W Wo Contrast      I have reviewed all medical records from hospital follow up including radiology reports and consults from other physicians. Appropriate follow up diagnostics will be scheduled as needed. Patient/ Family understands the plan of treatment. Time spent 25 minutes.   Dr Lavera Guise, MD Internal Medicine

## 2017-12-21 LAB — PATHOLOGY

## 2017-12-22 ENCOUNTER — Other Ambulatory Visit: Payer: Self-pay

## 2017-12-22 DIAGNOSIS — F5101 Primary insomnia: Secondary | ICD-10-CM

## 2017-12-22 MED ORDER — TRAZODONE HCL 50 MG PO TABS: 50.0000 mg | ORAL_TABLET | Freq: Every evening | ORAL | 3 refills | Status: DC | PRN

## 2017-12-22 MED ORDER — HYDRALAZINE HCL 10 MG PO TABS: 10.0000 mg | ORAL_TABLET | Freq: Three times a day (TID) | ORAL | 1 refills | Status: DC

## 2017-12-26 ENCOUNTER — Ambulatory Visit
Admission: RE | Admit: 2017-12-26 | Discharge: 2017-12-26 | Disposition: A | Discharge status: Home / Self Care | Payer: Medicare Other | Source: Ambulatory Visit | Attending: Nurse Practitioner | Admitting: Nurse Practitioner

## 2017-12-26 DIAGNOSIS — D1809 Hemangioma of other sites: Secondary | ICD-10-CM | POA: Diagnosis not present

## 2017-12-26 DIAGNOSIS — K862 Cyst of pancreas: Secondary | ICD-10-CM | POA: Diagnosis present

## 2017-12-26 DIAGNOSIS — K8020 Calculus of gallbladder without cholecystitis without obstruction: Secondary | ICD-10-CM | POA: Diagnosis not present

## 2017-12-26 DIAGNOSIS — N2889 Other specified disorders of kidney and ureter: Secondary | ICD-10-CM

## 2017-12-26 MED ORDER — GADOBENATE DIMEGLUMINE 529 MG/ML IV SOLN
10.0000 mL | Freq: Once | INTRAVENOUS | Status: AC | PRN
Start: 2017-12-26 — End: 2017-12-26
  Administered 2017-12-26: 10 mL via INTRAVENOUS

## 2018-01-05 DIAGNOSIS — Z96652 Presence of left artificial knee joint: Secondary | ICD-10-CM | POA: Diagnosis not present

## 2018-01-05 DIAGNOSIS — M1711 Unilateral primary osteoarthritis, right knee: Secondary | ICD-10-CM | POA: Diagnosis not present

## 2018-01-06 ENCOUNTER — Ambulatory Visit: Payer: Self-pay | Admitting: Orthopedic Surgery

## 2018-01-07 ENCOUNTER — Encounter: Payer: Self-pay | Admitting: Nurse Practitioner

## 2018-01-07 ENCOUNTER — Ambulatory Visit (INDEPENDENT_AMBULATORY_CARE_PROVIDER_SITE_OTHER): Payer: Medicare Other | Admitting: Nurse Practitioner

## 2018-01-07 VITALS — BP 126/81 | HR 79 | Resp 16 | Ht 65.0 in | Wt 251.8 lb

## 2018-01-07 DIAGNOSIS — G2581 Restless legs syndrome: Secondary | ICD-10-CM | POA: Diagnosis not present

## 2018-01-07 DIAGNOSIS — K8010 Calculus of gallbladder with chronic cholecystitis without obstruction: Secondary | ICD-10-CM | POA: Diagnosis not present

## 2018-01-07 DIAGNOSIS — I10 Essential (primary) hypertension: Secondary | ICD-10-CM | POA: Diagnosis not present

## 2018-01-07 MED ORDER — ROPINIROLE HCL 0.5 MG PO TABS: 0.5000 mg | ORAL_TABLET | Freq: Every day | ORAL | 1 refills | Status: DC

## 2018-01-07 MED ORDER — HYDRALAZINE HCL 10 MG PO TABS: 10.0000 mg | ORAL_TABLET | Freq: Three times a day (TID) | ORAL | 1 refills | Status: DC

## 2018-01-07 NOTE — Progress Notes (Signed)
[facility] [place], [place] [address]  Internal MEDICINE  Office Visit Note  Patient Name: Whitney [name]  [id number]  [id number]  Date of Service: 01/27/2018   Pt is here for routine follow up.   Chief Complaint  Patient presents with  . Follow-up    sx clearance     Patient had MRI of the abdomen. Did show multiple hemangiomas and cystic lesions of both pancreas and liver. Also showed gallstone. Also showed aortic atherosclerosis. She sees cardiology for this.   Near Syncope  This is a recurrent problem. The current episode started 1 to 4 weeks ago. The problem occurs intermittently. The problem has been unchanged. Associated symptoms include abdominal pain, arthralgias, diaphoresis, fatigue, myalgias, nausea and weakness. Pertinent negatives include no chest pain, chills, congestion, coughing, joint swelling, neck pain, numbness, rash, sore throat or vomiting. The symptoms are aggravated by bending. She has tried eating and drinking for the symptoms. The treatment provided mild relief.       Current Medication: Outpatient Encounter Medications as of 01/07/2018  Medication Sig  . acetaminophen (TYLENOL) 325 MG tablet Take 650 mg by mouth 4 (four) times daily as needed.   Marland Kitchen aspirin EC 81 MG tablet Take 1 tablet (81 mg total) by mouth daily.  Marland Kitchen atorvastatin (LIPITOR) 40 MG tablet Take 1 tablet (40 mg total) by mouth daily at 6 PM.  . carvedilol (COREG) 3.125 MG tablet Take 1 tablet (3.125 mg total) by mouth 2 (two) times daily with a meal.  . diclofenac sodium (VOLTAREN) 1 % GEL   . flecainide (TAMBOCOR) 50 MG tablet Take 1 tablet (50 mg total) by mouth every 12 (twelve) hours.  . fluconazole (DIFLUCAN) 150 MG tablet Take 1 tablet (150 mg total) by mouth every 3 (three) days.  Marland Kitchen gabapentin (NEURONTIN) 100 MG capsule Take 1 capsule (100 mg total) by mouth 2 (two) times daily.  . hydrALAZINE (APRESOLINE) 10 MG tablet Take 1 tablet (10 mg total) by mouth 3  (three) times daily.  Marland Kitchen nystatin-triamcinolone ointment (MYCOLOG) Apply 1 application topically 2 (two) times daily.  . traMADol (ULTRAM) 50 MG tablet Take by mouth.  . traZODone (DESYREL) 50 MG tablet Take 1 tablet (50 mg total) by mouth at bedtime as needed for sleep.  . [DISCONTINUED] hydrALAZINE (APRESOLINE) 10 MG tablet Take 1 tablet (10 mg total) by mouth 3 (three) times daily.  . [DISCONTINUED] rOPINIRole (REQUIP) 0.5 MG tablet Take 0.5 mg at bedtime by mouth. Takes 1 or 2 tablets as needed  . [DISCONTINUED] rOPINIRole (REQUIP) 0.5 MG tablet Take 1 tablet (0.5 mg total) by mouth at bedtime. Takes 1 or 2 tablets as needed   No facility-administered encounter medications on file as of 01/07/2018.     Surgical History: Past Surgical History:  Procedure Laterality Date  . HYSTEROSCOPY W/D&C N/A 02/16/2017   Procedure: DILATATION AND CURETTAGE /HYSTEROSCOPY;  Surgeon: Defrancesco, Alanda Slim, MD;  Location: [facility];  Service: Gynecology;  Laterality: N/A;  . KIDNEY SURGERY Left 1979   left kidney removed  . LEFT HEART CATH AND CORONARY ANGIOGRAPHY N/A 10/21/2017   Procedure: LEFT HEART CATH AND CORONARY ANGIOGRAPHY;  Surgeon: Teodoro Spray, MD;  Location: [facility];  Service: Cardiovascular;  Laterality: N/A;  . TOTAL KNEE ARTHROPLASTY Left 07/06/2017   Procedure: TOTAL KNEE ARTHROPLASTY;  Surgeon: Lovell Sheehan, MD;  Location: [facility];  Service: Orthopedics;  Laterality: Left;  . WRIST ARTHROSCOPY Left 1970s    Medical History:  Past Medical History:  Diagnosis Date  . Abnormal Pap smear of cervix    Pt states she had colposcopy   . Arthritis   . Chronic kidney disease   . Hx of unilateral nephrectomy 1979   Left Nephrectomy  . Hypertension   . PMB (postmenopausal bleeding)   . Restless leg syndrome   . Vitamin D deficiency     Family History: Family History  Problem Relation Age of Onset  . Ovarian cancer Mother   . Hypertension Father   . Diabetes Father    . Cancer Father   . Breast cancer Sister   . Diabetes Sister     Social History   Socioeconomic History  . Marital status: Divorced    Spouse name: Not on file  . Number of children: Not on file  . Years of education: Not on file  . Highest education level: Not on file  Occupational History  . Not on file  Social Needs  . Financial resource strain: Not on file  . Food insecurity:    Worry: Not on file    Inability: Not on file  . Transportation needs:    Medical: Not on file    Non-medical: Not on file  Tobacco Use  . Smoking status: Never Smoker  . Smokeless tobacco: Never Used  Substance and Sexual Activity  . Alcohol use: Yes    Comment: occasionally  . Drug use: Yes    Frequency: 3.0 times per week    Types: Marijuana, Cocaine    Comment: Pt states she uses for leg pain: marijuana; states no longer uses cocaine  . Sexual activity: Yes    Birth control/protection: None  Lifestyle  . Physical activity:    Days per week: Not on file    Minutes per session: Not on file  . Stress: Not on file  Relationships  . Social connections:    Talks on phone: Not on file    Gets together: Not on file    Attends religious service: Not on file    Active member of club or organization: Not on file    Attends meetings of clubs or organizations: Not on file    Relationship status: Not on file  . Intimate partner violence:    Fear of current or ex partner: Not on file    Emotionally abused: Not on file    Physically abused: Not on file    Forced sexual activity: Not on file  Other Topics Concern  . Not on file  Social History Narrative  . Not on file      Review of Systems  Constitutional: Positive for activity change, diaphoresis and fatigue. Negative for chills and unexpected weight change.  HENT: Negative for congestion, postnasal drip, rhinorrhea, sneezing and sore throat.   Eyes: Negative.  Negative for redness.  Respiratory: Negative for cough, chest tightness,  shortness of breath and wheezing.   Cardiovascular: Positive for palpitations and near-syncope. Negative for chest pain.  Gastrointestinal: Positive for abdominal pain, diarrhea and nausea. Negative for constipation and vomiting.  Endocrine: Negative for cold intolerance, heat intolerance, polydipsia, polyphagia and polyuria.  Genitourinary: Negative for dysuria and frequency.  Musculoskeletal: Positive for arthralgias, back pain and myalgias. Negative for joint swelling and neck pain.  Skin: Negative for rash.  Allergic/Immunologic: Positive for environmental allergies.  Neurological: Positive for weakness. Negative for tremors and numbness.  Hematological: Negative for adenopathy. Does not bruise/bleed easily.  Psychiatric/Behavioral: Negative for behavioral problems (Depression), sleep disturbance  and suicidal ideas. The patient is nervous/anxious.     Today's Vitals   01/07/18 1438  BP: 126/81  Pulse: 79  Resp: 16  SpO2: 97%  Weight: 251 lb 12.8 oz (114.2 kg)  Height: 5\' 5"  (1.651 m)    Physical Exam  Constitutional: She is oriented to person, place, and time. She appears well-developed and well-nourished. No distress.  HENT:  Head: Normocephalic and atraumatic.  Mouth/Throat: Oropharynx is clear and moist. No oropharyngeal exudate.  Eyes: Pupils are equal, round, and reactive to light. Conjunctivae and EOM are normal.  Neck: Normal range of motion. Neck supple. No JVD present. No tracheal deviation present. No thyromegaly present.  Cardiovascular: Normal rate, regular rhythm and normal heart sounds. Exam reveals no gallop and no friction rub.  No murmur heard. Pulmonary/Chest: Effort normal and breath sounds normal. No respiratory distress. She has no wheezes. She has no rales. She exhibits no tenderness.  Abdominal: Soft. Bowel sounds are normal. There is tenderness.  Musculoskeletal: Normal range of motion. She exhibits tenderness and deformity.  Knee   Lymphadenopathy:     She has no cervical adenopathy.  Neurological: She is alert and oriented to person, place, and time. No cranial nerve deficit.  Skin: Skin is warm and dry. She is not diaphoretic.  Psychiatric: She has a normal mood and affect. Her behavior is normal. Judgment and thought content normal.  Nursing note and vitals reviewed.  Assessment/Plan: 1. Calculus of gallbladder with cholecystitis without biliary obstruction, unspecified cholecystitis acuity May be cause of patient's abdominal pain and intermittent cramping. Will refer to general surgery for further evaluation.  - Ambulatory referral to General Surgery  2. Essential hypertension Stable. Continue bp medication as prescribed.  - hydrALAZINE (APRESOLINE) 10 MG tablet; Take 1 tablet (10 mg total) by mouth 3 (three) times daily.  Dispense: 270 tablet; Refill: 1  3. Restless leg syndrome Continue requip 0.5mg  as prescribed. Takes 1-2 tablets at bedtime as needed to control symptoms.   General Counseling: dorothyann mourer understanding of the findings of todays visit and agrees with plan of treatment. I have discussed any further diagnostic evaluation that may be needed or ordered today. We also reviewed her medications today. she has been encouraged to call the office with any questions or concerns that should arise related to todays visit.    Counseling:  This patient was seen by Leretha Pol, FNP- C in Collaboration with Dr Lavera Guise as a part of collaborative care agreement  Orders Placed This Encounter  Procedures  . Ambulatory referral to General Surgery    Meds ordered this encounter  Medications  . hydrALAZINE (APRESOLINE) 10 MG tablet    Sig: Take 1 tablet (10 mg total) by mouth 3 (three) times daily.    Dispense:  270 tablet    Refill:  1    Order Specific Question:   Supervising Provider    Answer:   Lavera Guise [[id number]]  . DISCONTD: rOPINIRole (REQUIP) 0.5 MG tablet    Sig: Take 1 tablet (0.5 mg total) by mouth  at bedtime. Takes 1 or 2 tablets as needed    Dispense:  90 tablet    Refill:  1    Order Specific Question:   Supervising Provider    Answer:   Lavera Guise [[id number]]    Time spent: 45 Minutes          Dr Lavera Guise Internal medicine

## 2018-01-11 ENCOUNTER — Telehealth: Payer: Self-pay

## 2018-01-11 ENCOUNTER — Other Ambulatory Visit: Payer: Self-pay

## 2018-01-11 DIAGNOSIS — G2581 Restless legs syndrome: Secondary | ICD-10-CM

## 2018-01-11 DIAGNOSIS — F5101 Primary insomnia: Secondary | ICD-10-CM | POA: Insufficient documentation

## 2018-01-11 DIAGNOSIS — R1084 Generalized abdominal pain: Secondary | ICD-10-CM | POA: Insufficient documentation

## 2018-01-11 DIAGNOSIS — K862 Cyst of pancreas: Secondary | ICD-10-CM | POA: Insufficient documentation

## 2018-01-11 DIAGNOSIS — N2889 Other specified disorders of kidney and ureter: Secondary | ICD-10-CM | POA: Insufficient documentation

## 2018-01-11 MED ORDER — ROPINIROLE HCL 0.5 MG PO TABS: ORAL_TABLET | ORAL | 1 refills | Status: DC

## 2018-01-11 NOTE — Telephone Encounter (Signed)
Only one order for ropinirole in her chart which is 0.5mg  twice daily when needed. Was sent to pharmacy last week. They can d/c any prior orders.

## 2018-01-11 NOTE — Telephone Encounter (Signed)
Note sent to provider 

## 2018-01-24 NOTE — Procedures (Signed)
[facility] [address] [place], [place] [address]  Patient Name: Whitney [name] DOB: 03-Nov-1950   SLEEP STUDY INTERPRETATION  DATE OF SERVICE: December 01, 2017   SLEEP STUDY HISTORY: This patient is referred to the sleep lab for a baseline Polysomnography. Pertinent history includes a history of diagnosis of excessive daytime somnolence and snoring.  PROCEDURE: This overnight polysomnogram was performed using the Alice 5 acquisition system using the standard diagnostic protocol as outlined by the AASM. This includes 6 channels of EEG, 2 channelscannels of EOG, chin EMG, bilateral anterior tibialis EMG, nasal/oral thermister, PTAF, chest and abdominal wall movements, ECG and pulse oximetry. Apneas and Hypopneas were scored per AASM definition.  SLEEP ARCHITECHTURE: This is a baseline polysomnograph  study. The total recording time was  323 minutes and the patients total sleep time is noted to be  118.5 minutes. Sleep onset latency was  52 minutes and is  prolonged.  Stage R sleep was not noted. Sleep maintenance efficiency was  36.7% and is  reduced.  Sleep staging expressed as a percentage of total sleep time demonstrated  22.4% N1,  77.6% N2 and  0% N3  sleep. Stage R represents  0% of total sleep time. This is  reduced.  There were a total of  18 arousals  for an overall arousal index of  9.1 per hour of sleep. PLMS arousal  Were not noted. Arousals without respiratory events are  noted. This can contribute to sleep architechture disruption.  RESPIRATORY MONITORING:   Patient exhibits  Some abnormality of sleep disorderd breathing characterized by  0 central apneas,  1 obstructive apneas and  1 mixed apneas. There were  15 obstructive hypopneas and  7 RERAs. Most of the apneas/hypopneas were of  Obstructive and mixed variety. The total apnea hypopnea index (apneas and hypopneas per hour of sleep) is  8.6 respiratory events per hour and is  mild.  Respiratory monitoring  demonstrated  moderate snoring through the night. There are a total of  187 snoring episodes representing  21.5% of sleep.   Baseline oxygen saturation during wakefulness was  95% and during NREM sleep averaged  96% through the night. There  was significant  oxygen desaturation with the respiratory events. Arterial oxygen desaturation occurred of at least 4%  was noted with a low saturation of  80%. The study was performed  off oxygen.  CARDIAC MONITORING:   Average heart rate is  64 during sleep with a high of  78 beats per minute. Malignant arrhythmias  Were not noted.    IMPRESSIONS:  --This overnight polysomnogram demonstrates  Presence of significant sleep apnea with an overall AHI  8.6 per hour. --There were associated  significant arterial oxygen desaturations noted  Down to 80% --There  Was no significant PLMS noted in this study. --There is  moderate snoring noted throughout the study.    RECOMMENDATIONS:  --CPAP titration study is  Indicated in this case. --Nasal decongestants and antihistamines may be of help for increased upper airways resistance when present. --Weight loss through dietary and lifestyle modification is recommended in the presence of obesity. --A search for and treatment of any underlying cardiopulmonary disease is      recommended in the presence of oxygen desaturations. --Alternative treatment options if the patient is not willing to use CPAP include oral   appliances as well as surgical intervention which may help in the appropriate patient. --Clinical correlation is recommended. Please feel free to call the office for  any further  questions or assistance in the care of this patient.     Allyne Gee, MD [facility]monary Critical Care Medicine Sleep medicine

## 2018-01-27 DIAGNOSIS — K8010 Calculus of gallbladder with chronic cholecystitis without obstruction: Secondary | ICD-10-CM | POA: Insufficient documentation

## 2018-01-27 DIAGNOSIS — G2581 Restless legs syndrome: Secondary | ICD-10-CM | POA: Insufficient documentation

## 2018-02-11 ENCOUNTER — Ambulatory Visit: Payer: Self-pay | Admitting: Internal Medicine

## 2018-02-12 ENCOUNTER — Other Ambulatory Visit: Payer: Self-pay | Admitting: Nurse Practitioner

## 2018-02-12 MED ORDER — ATORVASTATIN CALCIUM 40 MG PO TABS: 40.0000 mg | ORAL_TABLET | Freq: Every day | ORAL | 5 refills | Status: DC

## 2018-02-19 ENCOUNTER — Telehealth: Payer: Self-pay

## 2018-03-08 ENCOUNTER — Other Ambulatory Visit: Payer: Self-pay

## 2018-03-08 ENCOUNTER — Other Ambulatory Visit: Payer: Self-pay | Admitting: Nurse Practitioner

## 2018-03-08 DIAGNOSIS — I10 Essential (primary) hypertension: Secondary | ICD-10-CM

## 2018-03-08 MED ORDER — ATORVASTATIN CALCIUM 40 MG PO TABS: 40.0000 mg | ORAL_TABLET | Freq: Every day | ORAL | 1 refills | Status: DC

## 2018-04-07 ENCOUNTER — Other Ambulatory Visit: Payer: Self-pay

## 2018-04-07 MED ORDER — ATORVASTATIN CALCIUM 40 MG PO TABS: 40.0000 mg | ORAL_TABLET | Freq: Every day | ORAL | 1 refills | Status: DC

## 2018-04-12 ENCOUNTER — Ambulatory Visit: Payer: Self-pay | Admitting: Nurse Practitioner

## 2018-04-15 ENCOUNTER — Ambulatory Visit: Payer: Self-pay | Admitting: Internal Medicine

## 2018-05-04 ENCOUNTER — Encounter: Payer: Self-pay | Admitting: Internal Medicine

## 2018-05-04 NOTE — Progress Notes (Signed)
SCANNED IN PSG REPORT DONE ON 12/01/17.

## 2018-05-07 ENCOUNTER — Other Ambulatory Visit: Payer: Self-pay

## 2018-05-07 MED ORDER — ATORVASTATIN CALCIUM 40 MG PO TABS: 40.0000 mg | ORAL_TABLET | Freq: Every day | ORAL | 1 refills | Status: DC

## 2018-05-11 NOTE — Telephone Encounter (Signed)
Patient has been advised no refills can be approved while she is out of state, we can not send refill request for her medications while she is out of [place].Whitney [name]

## 2018-05-12 ENCOUNTER — Inpatient Hospital Stay: Admit: 2018-05-12 | Payer: Medicare Other | Admitting: Orthopedic Surgery

## 2018-05-12 SURGERY — ARTHROPLASTY, KNEE, TOTAL
Anesthesia: Choice | Laterality: Right

## 2018-05-17 ENCOUNTER — Ambulatory Visit: Payer: Medicare Other

## 2018-06-06 ENCOUNTER — Other Ambulatory Visit: Payer: Self-pay | Admitting: Nurse Practitioner

## 2018-08-31 DIAGNOSIS — Z96652 Presence of left artificial knee joint: Secondary | ICD-10-CM | POA: Diagnosis not present

## 2018-08-31 DIAGNOSIS — M1711 Unilateral primary osteoarthritis, right knee: Secondary | ICD-10-CM | POA: Diagnosis not present

## 2018-09-02 ENCOUNTER — Ambulatory Visit: Payer: Self-pay | Admitting: Orthopedic Surgery

## 2018-09-09 ENCOUNTER — Other Ambulatory Visit: Payer: Self-pay | Admitting: Orthopedic Surgery

## 2018-09-09 DIAGNOSIS — M1711 Unilateral primary osteoarthritis, right knee: Secondary | ICD-10-CM

## 2018-09-10 ENCOUNTER — Ambulatory Visit (INDEPENDENT_AMBULATORY_CARE_PROVIDER_SITE_OTHER): Payer: Medicare Other | Admitting: Nurse Practitioner

## 2018-09-10 ENCOUNTER — Encounter: Payer: Self-pay | Admitting: Nurse Practitioner

## 2018-09-10 VITALS — BP 150/90 | HR 84 | Resp 16 | Ht 65.0 in | Wt 247.0 lb

## 2018-09-10 DIAGNOSIS — Z01818 Encounter for other preprocedural examination: Secondary | ICD-10-CM

## 2018-09-10 DIAGNOSIS — M1711 Unilateral primary osteoarthritis, right knee: Secondary | ICD-10-CM

## 2018-09-10 DIAGNOSIS — I10 Essential (primary) hypertension: Secondary | ICD-10-CM | POA: Diagnosis not present

## 2018-09-10 DIAGNOSIS — F5101 Primary insomnia: Secondary | ICD-10-CM

## 2018-09-10 DIAGNOSIS — G4733 Obstructive sleep apnea (adult) (pediatric): Secondary | ICD-10-CM | POA: Diagnosis not present

## 2018-09-10 DIAGNOSIS — E782 Mixed hyperlipidemia: Secondary | ICD-10-CM

## 2018-09-10 MED ORDER — ATORVASTATIN CALCIUM 40 MG PO TABS: 40.0000 mg | ORAL_TABLET | Freq: Every day | ORAL | 1 refills | Status: DC

## 2018-09-10 MED ORDER — TRAMADOL HCL 50 MG PO TABS: 50.0000 mg | ORAL_TABLET | Freq: Four times a day (QID) | ORAL | 0 refills | Status: DC | PRN

## 2018-09-10 MED ORDER — CARVEDILOL 3.125 MG PO TABS: 3.1250 mg | ORAL_TABLET | Freq: Two times a day (BID) | ORAL | 3 refills | Status: DC

## 2018-09-10 MED ORDER — TRAZODONE HCL 50 MG PO TABS: 50.0000 mg | ORAL_TABLET | Freq: Every evening | ORAL | 3 refills | Status: DC | PRN

## 2018-09-10 NOTE — Progress Notes (Signed)
[facility] [place], [place] [address]  Internal MEDICINE  Office Visit Note  Patient Name: Whitney [name]  [id number]  [id number]  Date of Service: 09/15/2018  Chief Complaint  Patient presents with  . Medical Management of Chronic Issues    general follow up, pt had sleep study done but no results, has an appointment with dr Marlyn Corporal, Feb 11,2020, medication refills  . Medical Clearance    form to fill out,     The patient is here for follow up visit. She has been [place] over six [place]. Is now scheduled to have right knee replacement November 17, 2018. She needs to have surgical clearance for this procedure. In addition to primary care, she sees cardiology and neurology and will need to have clearance from them as well.  Her blood pressure is slightly elevated today. Since out of town, her medications have been a little "off."  She did get this refilled but did not take blood pressure medication today. She also states that she takes her flecaninde on as needed basis and not every day. She does need to have follow up with her cardiologist.  She had sleep study prior to leaving town. She did have moderate sleep apnea and moderate snoring. She has not followed up regarding this test. Needs to have CPAP titration study, probably before total knee replacement.       Current Medication: Outpatient Encounter Medications as of 09/10/2018  Medication Sig  . acetaminophen (TYLENOL) 325 MG tablet Take 650 mg by mouth 4 (four) times daily as needed.   Marland Kitchen aspirin EC 81 MG tablet Take 1 tablet (81 mg total) by mouth daily.  Marland Kitchen atorvastatin (LIPITOR) 40 MG tablet Take 1 tablet (40 mg total) by mouth daily at 6 PM.  . carvedilol (COREG) 3.125 MG tablet Take 1 tablet (3.125 mg total) by mouth 2 (two) times daily with a meal.  . diclofenac sodium (VOLTAREN) 1 % GEL   . gabapentin (NEURONTIN) 100 MG capsule Take 1 capsule (100 mg total) by mouth 2 (two) times daily.  .  hydrALAZINE (APRESOLINE) 10 MG tablet Take 1 tablet (10 mg total) by mouth 3 (three) times daily.  Marland Kitchen nystatin-triamcinolone ointment (MYCOLOG) Apply 1 application topically 2 (two) times daily.  Marland Kitchen rOPINIRole (REQUIP) 0.5 MG tablet Takes 1 or 2 tablets daily as needed  . traMADol (ULTRAM) 50 MG tablet Take 1 tablet (50 mg total) by mouth every 6 (six) hours as needed.  . traZODone (DESYREL) 50 MG tablet Take 1 tablet (50 mg total) by mouth at bedtime as needed for sleep.  . [DISCONTINUED] atorvastatin (LIPITOR) 40 MG tablet Take 1 tablet (40 mg total) by mouth daily at 6 PM.  . [DISCONTINUED] carvedilol (COREG) 3.125 MG tablet Take 1 tablet (3.125 mg total) by mouth 2 (two) times daily with a meal.  . [DISCONTINUED] flecainide (TAMBOCOR) 50 MG tablet Take 1 tablet (50 mg total) by mouth every 12 (twelve) hours.  . [DISCONTINUED] fluconazole (DIFLUCAN) 150 MG tablet Take 1 tablet (150 mg total) by mouth every 3 (three) days.  . [DISCONTINUED] traMADol (ULTRAM) 50 MG tablet Take by mouth.  . [DISCONTINUED] traZODone (DESYREL) 50 MG tablet Take 1 tablet (50 mg total) by mouth at bedtime as needed for sleep.   No facility-administered encounter medications on file as of 09/10/2018.     Surgical History: Past Surgical History:  Procedure Laterality Date  . HYSTEROSCOPY W/D&C N/A 02/16/2017   Procedure: DILATATION AND  CURETTAGE /HYSTEROSCOPY;  Surgeon: Defrancesco, Alanda Slim, MD;  Location: [facility];  Service: Gynecology;  Laterality: N/A;  . KIDNEY SURGERY Left 1979   left kidney removed  . LEFT HEART CATH AND CORONARY ANGIOGRAPHY N/A 10/21/2017   Procedure: LEFT HEART CATH AND CORONARY ANGIOGRAPHY;  Surgeon: Teodoro Spray, MD;  Location: [facility];  Service: Cardiovascular;  Laterality: N/A;  . TOTAL KNEE ARTHROPLASTY Left 07/06/2017   Procedure: TOTAL KNEE ARTHROPLASTY;  Surgeon: Lovell Sheehan, MD;  Location: [facility];  Service: Orthopedics;  Laterality: Left;  . WRIST ARTHROSCOPY Left  1970s    Medical History: Past Medical History:  Diagnosis Date  . Abnormal Pap smear of cervix    Pt states she had colposcopy   . Arthritis   . Chronic kidney disease   . Hx of unilateral nephrectomy 1979   Left Nephrectomy  . Hypertension   . PMB (postmenopausal bleeding)   . Restless leg syndrome   . Vitamin D deficiency     Family History: Family History  Problem Relation Age of Onset  . Ovarian cancer Mother   . Hypertension Father   . Diabetes Father   . Cancer Father   . Breast cancer Sister   . Diabetes Sister     Social History   Socioeconomic History  . Marital status: Divorced    Spouse name: Not on file  . Number of children: Not on file  . Years of education: Not on file  . Highest education level: Not on file  Occupational History  . Not on file  Social Needs  . Financial resource strain: Not on file  . Food insecurity:    Worry: Not on file    Inability: Not on file  . Transportation needs:    Medical: Not on file    Non-medical: Not on file  Tobacco Use  . Smoking status: Never Smoker  . Smokeless tobacco: Never Used  Substance and Sexual Activity  . Alcohol use: Yes    Comment: occasionally  . Drug use: Yes    Frequency: 3.0 times per week    Types: Marijuana    Comment: Pt states she uses for leg pain: marijuana; states no longer uses cocaine  . Sexual activity: Yes    Birth control/protection: None  Lifestyle  . Physical activity:    Days per week: Not on file    Minutes per session: Not on file  . Stress: Not on file  Relationships  . Social connections:    Talks on phone: Not on file    Gets together: Not on file    Attends religious service: Not on file    Active member of club or organization: Not on file    Attends meetings of clubs or organizations: Not on file    Relationship status: Not on file  . Intimate partner violence:    Fear of current or ex partner: Not on file    Emotionally abused: Not on file     Physically abused: Not on file    Forced sexual activity: Not on file  Other Topics Concern  . Not on file  Social History Narrative  . Not on file      Review of Systems  Constitutional: Positive for activity change, diaphoresis and fatigue. Negative for chills and unexpected weight change.  HENT: Negative for congestion, postnasal drip, rhinorrhea, sneezing and sore throat.   Eyes: Negative for redness.  Respiratory: Negative for cough, chest tightness, shortness of  breath and wheezing.   Cardiovascular: Positive for palpitations. Negative for chest pain.  Gastrointestinal: Positive for abdominal pain, diarrhea and nausea. Negative for constipation and vomiting.  Endocrine: Negative for cold intolerance, heat intolerance, polydipsia and polyuria.  Genitourinary: Negative for dysuria and frequency.  Musculoskeletal: Positive for arthralgias, back pain and myalgias. Negative for joint swelling and neck pain.  Skin: Negative for rash.  Allergic/Immunologic: Positive for environmental allergies.  Neurological: Positive for weakness. Negative for tremors and numbness.  Hematological: Negative for adenopathy. Does not bruise/bleed easily.  Psychiatric/Behavioral: Negative for behavioral problems (Depression), sleep disturbance and suicidal ideas. The patient is nervous/anxious.    Today's Vitals   09/10/18 1353  BP: (!) 150/90  Pulse: 84  Resp: 16  SpO2: 94%  Weight: 247 lb (112 kg)  Height: 5\' 5"  (1.651 m)   Body mass index is 41.1 kg/m.  Physical Exam Vitals signs and nursing note reviewed.  Constitutional:      General: She is not in acute distress.    Appearance: She is well-developed. She is obese. She is not diaphoretic.  HENT:     Head: Normocephalic and atraumatic.     Mouth/Throat:     Pharynx: No oropharyngeal exudate.  Eyes:     Conjunctiva/sclera: Conjunctivae normal.     Pupils: Pupils are equal, round, and reactive to light.  Neck:     Musculoskeletal:  Normal range of motion and neck supple.     Thyroid: No thyromegaly.     Vascular: No JVD.     Trachea: No tracheal deviation.  Cardiovascular:     Rate and Rhythm: Normal rate and regular rhythm.     Heart sounds: Normal heart sounds. No murmur. No friction rub. No gallop.   Pulmonary:     Effort: Pulmonary effort is normal. No respiratory distress.     Breath sounds: Normal breath sounds. No wheezing or rales.  Chest:     Chest wall: No tenderness.  Abdominal:     General: Bowel sounds are normal.     Palpations: Abdomen is soft.     Tenderness: There is abdominal tenderness.  Musculoskeletal: Normal range of motion.        General: Tenderness and deformity present.     Comments: Knee   Lymphadenopathy:     Cervical: No cervical adenopathy.  Skin:    General: Skin is warm and dry.  Neurological:     Mental Status: She is alert and oriented to person, place, and time.     Cranial Nerves: No cranial nerve deficit.  Psychiatric:        Behavior: Behavior normal.        Thought Content: Thought content normal.        Judgment: Judgment normal.   Assessment/Plan: 1. Essential hypertension Generally stable however off of coreg. Restart at 3.125mg  twice daily. Reviewed DASH diet. Follow u pwith cardiology.  - carvedilol (COREG) 3.125 MG tablet; Take 1 tablet (3.125 mg total) by mouth 2 (two) times daily with a meal.  Dispense: 60 tablet; Refill: 3  2. Primary osteoarthritis of right knee Patient to have knee replacment surgery in April. Will give short term prescription for tramadol 50mg  to take up to QID if needed for pain. A prescription for #20 tablets was sent to her pharmacy.  - traMADol (ULTRAM) 50 MG tablet; Take 1 tablet (50 mg total) by mouth every 6 (six) hours as needed.  Dispense: 20 tablet; Refill: 0  3. Encounter for pre-operative examination Pre-operative  paperwork received for total knee replacement. Patient will need to have routine blood work. She will also need  to see pulmonolgy for OSA follow up and cardiology for pre-operative clearance as well.   4. Primary insomnia May take trazodone 50mg  at bedtime as needed to help with insomnia.  - traZODone (DESYREL) 50 MG tablet; Take 1 tablet (50 mg total) by mouth at bedtime as needed for sleep.  Dispense: 30 tablet; Refill: 3  5. Obstructive sleep apnea Sleep study positive for OSA but has not followed up regarding study. Refer to Dr. Devona Konig for further evaluation and treatment with CPAP/  - Ambulatory referral to Pulmonology  6. Mixed hyperlipidemia Continue atorvastatin as prescribed . - atorvastatin (LIPITOR) 40 MG tablet; Take 1 tablet (40 mg total) by mouth daily at 6 PM.  Dispense: 90 tablet; Refill: 1  General Counseling: Emilea verbalizes understanding of the findings of todays visit and agrees with plan of treatment. I have discussed any further diagnostic evaluation that may be needed or ordered today. We also reviewed her medications today. she has been encouraged to call the office with any questions or concerns that should arise related to todays visit.  Counseling: Adherence of Medical Therapy: The patient understands that it is the responsibility of the patient to complete all prescribed medications, all recommended testing, including but not limited to, laboratory studies and imaging. The patient further understands the need to keep all scheduled follow-up visits and to inform the office immediately of any changes in their medical condition. The patient understands that the success of treatment in large part depends on the patient's willingness to complete the therapeutic regimen and to work in partnership with the designated health-care providers.  This patient was seen by Leretha Pol FNP Collaboration with Dr Lavera Guise as a part of collaborative care agreement  Orders Placed This Encounter  Procedures  . Ambulatory referral to Pulmonology    Meds ordered this encounter   Medications  . atorvastatin (LIPITOR) 40 MG tablet    Sig: Take 1 tablet (40 mg total) by mouth daily at 6 PM.    Dispense:  90 tablet    Refill:  1    Order Specific Question:   Supervising Provider    Answer:   Lavera Guise [place]  . carvedilol (COREG) 3.125 MG tablet    Sig: Take 1 tablet (3.125 mg total) by mouth 2 (two) times daily with a meal.    Dispense:  60 tablet    Refill:  3    Order Specific Question:   Supervising Provider    Answer:   Lavera Guise [place]  . traZODone (DESYREL) 50 MG tablet    Sig: Take 1 tablet (50 mg total) by mouth at bedtime as needed for sleep.    Dispense:  30 tablet    Refill:  3    Order Specific Question:   Supervising Provider    Answer:   Lavera Guise [[id number]]  . traMADol (ULTRAM) 50 MG tablet    Sig: Take 1 tablet (50 mg total) by mouth every 6 (six) hours as needed.    Dispense:  20 tablet    Refill:  0    Order Specific Question:   Supervising Provider    Answer:   Lavera Guise [[id number]]    Time spent: 69 Minutes      Dr Lavera Guise Internal medicine

## 2018-09-14 ENCOUNTER — Encounter: Payer: Self-pay | Admitting: General Surgery

## 2018-09-14 ENCOUNTER — Other Ambulatory Visit: Payer: Self-pay

## 2018-09-14 ENCOUNTER — Ambulatory Visit (INDEPENDENT_AMBULATORY_CARE_PROVIDER_SITE_OTHER): Payer: Medicare Other | Admitting: General Surgery

## 2018-09-14 VITALS — BP 138/70 | HR 80 | Temp 95.4°F | Resp 18 | Ht 65.0 in | Wt 257.2 lb

## 2018-09-14 DIAGNOSIS — R55 Syncope and collapse: Secondary | ICD-10-CM

## 2018-09-14 DIAGNOSIS — K8020 Calculus of gallbladder without cholecystitis without obstruction: Secondary | ICD-10-CM

## 2018-09-14 DIAGNOSIS — R1011 Right upper quadrant pain: Secondary | ICD-10-CM

## 2018-09-14 NOTE — Progress Notes (Addendum)
Patient ID: Whitney [name], female   DOB: 18-Aug-1950, 67 y.o.   MRN: [id number]  Chief Complaint  Patient presents with  . New Patient (Initial Visit)    Cholelithiasis    HPI Whitney [name] is a 68 y.o. female.  Here today for consult for cholelithiasis referred by Leretha Pol NP.   Patient states she gets very nauseated, with associated vague gnawing sensation in the epigastrium followed by a general sense of weakness, development of cold clammy skin throughout the trunk upper extremities, head and neck and then frequent episodes of syncope.   The patient has not had any episode of postprandial pain.  The spells have occurred throughout the day.  It was only on the third-fourth episode that she agreed for transport to the hospital.  This was documented in March 2019.  Extensive evaluation was completed as well as subsequent evaluation by neurology and cardiology with no etiology appreciated.  The patient had been identified with cholelithiasis, and although she has no clear-cut biliary symptoms, she was referred for assessment.  The patient does report making use of marijuana, but has not found that this to be correlated with her episodes of distress.    HPI  Past Medical History:  Diagnosis Date  . Abnormal Pap smear of cervix    Pt states she had colposcopy   . Arthritis   . Chronic kidney disease   . Hx of unilateral nephrectomy 1979   Left Nephrectomy  . Hypertension   . PMB (postmenopausal bleeding)   . Restless leg syndrome   . Vitamin D deficiency     Past Surgical History:  Procedure Laterality Date  . HYSTEROSCOPY W/D&C N/A 02/16/2017   Procedure: DILATATION AND CURETTAGE /HYSTEROSCOPY;  Surgeon: Defrancesco, Alanda Slim, MD;  Location: [facility];  Service: Gynecology;  Laterality: N/A;  . KIDNEY SURGERY Left 1979   left kidney removed  . LEFT HEART CATH AND CORONARY ANGIOGRAPHY N/A 10/21/2017   Procedure: LEFT HEART CATH AND CORONARY ANGIOGRAPHY;  Surgeon: Teodoro Spray, MD;  Location: [facility];  Service: Cardiovascular;  Laterality: N/A;  . TOTAL KNEE ARTHROPLASTY Left 07/06/2017   Procedure: TOTAL KNEE ARTHROPLASTY;  Surgeon: Lovell Sheehan, MD;  Location: [facility];  Service: Orthopedics;  Laterality: Left;  . WRIST ARTHROSCOPY Left 1970s    Family History  Problem Relation Age of Onset  . Ovarian cancer Mother   . Hypertension Father   . Diabetes Father   . Cancer Father   . Breast cancer Sister   . Diabetes Sister     Social History Social History   Tobacco Use  . Smoking status: Never Smoker  . Smokeless tobacco: Never Used  Substance Use Topics  . Alcohol use: Yes    Comment: occasionally  . Drug use: Yes    Frequency: 3.0 times per week    Types: Marijuana    Comment: Pt states she uses for leg pain: marijuana; states no longer uses cocaine    Allergies  Allergen Reactions  . Enalapril Swelling  . Lisinopril Swelling    Current Outpatient Medications  Medication Sig Dispense Refill  . amLODipine (NORVASC) 5 MG tablet amlodipine 5 mg tablet    . aspirin EC 81 MG tablet Take 1 tablet (81 mg total) by mouth daily. 30 tablet 0  . atorvastatin (LIPITOR) 40 MG tablet Take 1 tablet (40 mg total) by mouth daily at 6 PM. 90 tablet 1  . carvedilol (COREG) 3.125 MG tablet Take 1 tablet (  3.125 mg total) by mouth 2 (two) times daily with a meal. 60 tablet 3  . diclofenac sodium (VOLTAREN) 1 % GEL   0  . gabapentin (NEURONTIN) 100 MG capsule Take 1 capsule (100 mg total) by mouth 2 (two) times daily. 90 capsule 0  . hydrALAZINE (APRESOLINE) 10 MG tablet Take 1 tablet (10 mg total) by mouth 3 (three) times daily. 270 tablet 1  . nystatin-triamcinolone ointment (MYCOLOG) Apply 1 application topically 2 (two) times daily. 30 g 0  . rOPINIRole (REQUIP) 0.5 MG tablet Takes 1 or 2 tablets daily as needed 90 tablet 1  . traMADol (ULTRAM) 50 MG tablet Take 1 tablet (50 mg total) by mouth every 6 (six) hours as needed. 20 tablet 0  .  traZODone (DESYREL) 50 MG tablet Take 1 tablet (50 mg total) by mouth at bedtime as needed for sleep. 30 tablet 3  . acetaminophen (TYLENOL) 325 MG tablet Take 650 mg by mouth 4 (four) times daily as needed.      No current facility-administered medications for this visit.     Review of Systems Review of Systems  Constitutional: Negative.  Negative for fatigue and fever.  HENT: Negative.   Respiratory: Negative.   Cardiovascular: Negative.   Gastrointestinal: Positive for nausea. Negative for diarrhea.  Endocrine: Negative.   Genitourinary: Negative.   Musculoskeletal: Negative.   Allergic/Immunologic: Negative.   Neurological: Negative.   Hematological: Negative.   Psychiatric/Behavioral: Negative.     Blood pressure 138/70, pulse 80, temperature (!) 95.4 F (35.2 C), temperature source Temporal, resp. rate 18, height 5\' 5"  (1.651 m), weight 257 lb 3.2 oz (116.7 kg), last menstrual period 01/26/2017, SpO2 98 %.  Physical Exam Physical Exam Constitutional:      Appearance: Normal appearance. She is obese.  HENT:     Head: Normocephalic and atraumatic.     Nose: Nose normal.     Mouth/Throat:     Mouth: Mucous membranes are moist.  Eyes:     Extraocular Movements: Extraocular movements intact.     Conjunctiva/sclera: Conjunctivae normal.     Pupils: Pupils are equal, round, and reactive to light.  Neck:     Musculoskeletal: Normal range of motion and neck supple.  Cardiovascular:     Rate and Rhythm: Normal rate and regular rhythm.     Heart sounds: No murmur. No friction rub. No gallop.      Comments: No peripheral edema.  Pulmonary:     Effort: Pulmonary effort is normal.     Breath sounds: Normal breath sounds.  Abdominal:     General: There is no distension.     Palpations: There is no mass.     Tenderness: There is no abdominal tenderness. There is no guarding.     Hernia: No hernia is present.  Lymphadenopathy:     Lower Body: No right inguinal adenopathy. No  left inguinal adenopathy.  Neurological:     Mental Status: She is alert.     Cranial Nerves: Cranial nerves are intact.     Motor: Motor function is intact.     Coordination: Coordination is intact.  Psychiatric:        Attention and Perception: Attention and perception normal.        Mood and Affect: Mood and affect normal.        Speech: Speech normal.        Behavior: Behavior normal. Behavior is cooperative.        Thought Content: Thought content  normal.        Cognition and Memory: Cognition normal.        Judgment: Judgment normal.     Data Reviewed CT of the abdomen and pelvis obtained on October 20, 2017 when she first presented for evaluation for her syncopal spell was notable for a large heart, coronary calcifications.  Several low attenuating structures within the liver.  MRI recommended.  Colonic diverticulosis.  Uterine fibroids.  Diffuse aortic atherosclerosis.  MRI of the brain on the same date showed chronic microvascular disease without acute intracranial abnormality.  MRI of the abdomen dated Dec 26, 2017 showed multiple hemangiomas.  Solitary 8 mm gallstone without mention of cholecystitis in the fundus.  Multiple small cystic lesions within the pancreas possibly representing side duct ectasia.  Follow-up imaging in 24 months was recommended.   Cardiac catheterization dated October 21, 2017 showed normal mitral, aortic valves.  EF 55-65%.  Distal R CEA lesion 60%, proximal LAD 50%.  Normal LV function.  Troponin levels were normal.  Sleep study dated December 01, 2017 showed significant sleep apnea.  Oxygen desaturation down 80%.    CBC dated November 29, 2017 showed hemoglobin of 13.2 with an MCV of 87.5, white blood cell count 6100, elevated RDW at 14.6, platelet count of 285,000.  Patient about panel showed normal electrolytes, elevated creatinine 1.3 with an estimated GFR 48.  5 HIAA on a 24-hour urine collection was normal.  Serum metanephrines were normal,  normetanephrine is elevated at 152 (0-145.  March 2019 hospitalization records reviewed.  Assessment    Extremely atypical presentation for biliary colic.    Syncopal spell more suggestive of cardiac arrhythmia. (Noted with bradycardia/ bigeminy during hospitalization.    Plan    Will arrange for a HIDA scan without stimulation.  If the gallbladder is functioning I think it is very unlikely that elective cholecystectomy would provide any benefit to the patient.  I cannot tell whether the patient has had a Holter monitor, but this would certainly be something to consider with her report of increasing episodes of this vague sense of doom followed by nausea and lightheadedness.      HPI, Physical Exam, Assessment and Plan have been scribed under the direction and in the presence of Robert Bellow, MD. Jonnie Finner, CMA  HPI, assessment, plan and physical exam has been scribed under the direction and in the presence of Robert Bellow, MD. Karie Fetch, RN   I have completed the exam and reviewed the above documentation for accuracy and completeness.  I agree with the above.  Haematologist has been used and any errors in dictation or transcription are unintentional.  [name], M.D., F.A.C.S.  Whitney [name] 09/15/2018, 6:40 PM

## 2018-09-14 NOTE — Patient Instructions (Addendum)
We will order the HIDA scan without stimulation.   You are scheduled for a HIDA scan at [facility] on Thursday February 20th at 8:15 am. You will arrive there by 8:00 am and report to the [facility] [address] desk on the right. You will have nothing to eat or drink after midnight the night prior. Hold all pain medications 6 hours prior. We will call you with your results.   Cholelithiasis  Cholelithiasis is also called "gallstones." It is a kind of gallbladder disease. The gallbladder is an organ that stores a liquid (bile) that helps you digest fat. Gallstones may not cause symptoms (may be silent gallstones) until they cause a blockage, and then they can cause pain (gallbladder attack). Follow these instructions at home:  Take over-the-counter and prescription medicines only as told by your doctor.  Stay at a healthy weight.  Eat healthy foods. This includes: ? Eating fewer fatty foods, like fried foods. ? Eating fewer refined carbs (refined carbohydrates). Refined carbs are breads and grains that are highly processed, like white bread and white rice. Instead, choose whole grains like whole-wheat bread and brown rice. ? Eating more fiber. Almonds, fresh fruit, and beans are healthy sources of fiber.  Keep all follow-up visits as told by your doctor. This is important. Contact a doctor if:  You have sudden pain in the upper right side of your belly (abdomen). Pain might spread to your right shoulder or your chest. This may be a sign of a gallbladder attack.  You feel sick to your stomach (are nauseous).  You throw up (vomit).  You have been diagnosed with gallstones that have no symptoms and you get: ? Belly pain. ? Discomfort, burning, or fullness in the upper part of your belly (indigestion). Get help right away if:  You have sudden pain in the upper right side of your belly, and it lasts for more than 2 hours.  You have belly pain that lasts for more than 5 hours.  You have a fever or  chills.  You keep feeling sick to your stomach or you keep throwing up.  Your skin or the whites of your eyes turn yellow (jaundice).  You have dark-colored pee (urine).  You have light-colored poop (stool). Summary  Cholelithiasis is also called "gallstones."  The gallbladder is an organ that stores a liquid (bile) that helps you digest fat.  Silent gallstones are gallstones that do not cause symptoms.  A gallbladder attack may cause sudden pain in the upper right side of your belly. Pain might spread to your right shoulder or your chest. If this happens, contact your doctor.  If you have sudden pain in the upper right side of your belly that lasts for more than 2 hours, get help right away. This information is not intended to replace advice given to you by your health care provider. Make sure you discuss any questions you have with your health care provider. Document Released: 01/07/2008 Document Revised: 04/06/2016 Document Reviewed: 04/06/2016 Elsevier Interactive Patient Education  2019 [employer].

## 2018-09-15 DIAGNOSIS — Z01818 Encounter for other preprocedural examination: Secondary | ICD-10-CM | POA: Insufficient documentation

## 2018-09-15 DIAGNOSIS — G4733 Obstructive sleep apnea (adult) (pediatric): Secondary | ICD-10-CM | POA: Insufficient documentation

## 2018-09-15 DIAGNOSIS — M1711 Unilateral primary osteoarthritis, right knee: Secondary | ICD-10-CM | POA: Insufficient documentation

## 2018-09-15 DIAGNOSIS — K8020 Calculus of gallbladder without cholecystitis without obstruction: Secondary | ICD-10-CM | POA: Insufficient documentation

## 2018-09-17 ENCOUNTER — Ambulatory Visit: Payer: Medicare Other

## 2018-09-24 ENCOUNTER — Ambulatory Visit: Payer: Medicare Other

## 2018-09-27 ENCOUNTER — Other Ambulatory Visit: Payer: Self-pay | Admitting: Nurse Practitioner

## 2018-09-27 DIAGNOSIS — Z01818 Encounter for other preprocedural examination: Secondary | ICD-10-CM | POA: Diagnosis not present

## 2018-09-27 DIAGNOSIS — R7301 Impaired fasting glucose: Secondary | ICD-10-CM | POA: Diagnosis not present

## 2018-09-27 DIAGNOSIS — R944 Abnormal results of kidney function studies: Secondary | ICD-10-CM | POA: Diagnosis not present

## 2018-09-27 DIAGNOSIS — I10 Essential (primary) hypertension: Secondary | ICD-10-CM | POA: Diagnosis not present

## 2018-09-27 DIAGNOSIS — E782 Mixed hyperlipidemia: Secondary | ICD-10-CM | POA: Diagnosis not present

## 2018-09-28 ENCOUNTER — Ambulatory Visit
Admission: RE | Admit: 2018-09-28 | Discharge: 2018-09-28 | Disposition: A | Discharge status: Home / Self Care | Payer: Medicare Other | Source: Ambulatory Visit | Attending: Orthopedic Surgery | Admitting: Orthopedic Surgery

## 2018-09-28 DIAGNOSIS — M1711 Unilateral primary osteoarthritis, right knee: Secondary | ICD-10-CM | POA: Diagnosis present

## 2018-09-28 DIAGNOSIS — Z01818 Encounter for other preprocedural examination: Secondary | ICD-10-CM | POA: Diagnosis not present

## 2018-09-28 LAB — URINALYSIS, ROUTINE W REFLEX MICROSCOPIC
Bilirubin, UA: NEGATIVE
Glucose, UA: NEGATIVE
Ketones, UA: NEGATIVE
Nitrite, UA: NEGATIVE
RBC, UA: NEGATIVE
Specific Gravity, UA: 1.017 (ref 1.005–1.030)
Urobilinogen, Ur: 0.2 mg/dL (ref 0.2–1.0)
pH, UA: 5.5 (ref 5.0–7.5)

## 2018-09-28 LAB — MICROSCOPIC EXAMINATION
Casts: NONE SEEN /lpf
Epithelial Cells (non renal): >10 /hpf — AB (ref 0–10)

## 2018-09-28 LAB — COMPREHENSIVE METABOLIC PANEL
ALT: 12 IU/L (ref 0–32)
AST: 14 IU/L (ref 0–40)
Albumin/Globulin Ratio: 1.2 (ref 1.2–2.2)
Albumin: 3.5 g/dL — ABNORMAL LOW (ref 3.8–4.8)
Alkaline Phosphatase: 102 IU/L (ref 39–117)
BUN/Creatinine Ratio: 21 (ref 12–28)
BUN: 25 mg/dL (ref 8–27)
Bilirubin Total: 0.3 mg/dL (ref 0.0–1.2)
CO2: 23 mmol/L (ref 20–29)
Calcium: 8.6 mg/dL — ABNORMAL LOW (ref 8.7–10.3)
Chloride: 106 mmol/L (ref 96–106)
Creatinine, Ser: 1.2 mg/dL — ABNORMAL HIGH (ref 0.57–1.00)
GFR calc Af Amer: 54 mL/min/{1.73_m2} — ABNORMAL LOW (ref >59)
GFR calc non Af Amer: 47 mL/min/{1.73_m2} — ABNORMAL LOW (ref >59)
Globulin, Total: 3 g/dL (ref 1.5–4.5)
Glucose: 88 mg/dL (ref 65–99)
Potassium: 4.9 mmol/L (ref 3.5–5.2)
Sodium: 144 mmol/L (ref 134–144)
Total Protein: 6.5 g/dL (ref 6.0–8.5)

## 2018-09-28 LAB — CBC
Hematocrit: 40.7 % (ref 34.0–46.6)
Hemoglobin: 13.4 g/dL (ref 11.1–15.9)
MCH: 28.8 pg (ref 26.6–33.0)
MCHC: 32.9 g/dL (ref 31.5–35.7)
MCV: 87 fL (ref 79–97)
Platelets: 264 10*3/uL (ref 150–450)
RBC: 4.66 x10E6/uL (ref 3.77–5.28)
RDW: 12.6 % (ref 11.7–15.4)
WBC: 5.8 10*3/uL (ref 3.4–10.8)

## 2018-09-28 LAB — HGB A1C W/O EAG: Hgb A1c MFr Bld: 5.8 % — ABNORMAL HIGH (ref 4.8–5.6)

## 2018-09-28 LAB — TSH: TSH: 1.73 u[IU]/mL (ref 0.450–4.500)

## 2018-09-28 LAB — T3: T3, Total: 104 ng/dL (ref 71–180)

## 2018-09-28 LAB — T4, FREE: Free T4: 0.9 ng/dL (ref 0.82–1.77)

## 2018-09-29 DIAGNOSIS — M47812 Spondylosis without myelopathy or radiculopathy, cervical region: Secondary | ICD-10-CM | POA: Diagnosis not present

## 2018-09-29 DIAGNOSIS — M4802 Spinal stenosis, cervical region: Secondary | ICD-10-CM | POA: Diagnosis not present

## 2018-09-29 DIAGNOSIS — R51 Headache: Secondary | ICD-10-CM | POA: Diagnosis not present

## 2018-09-30 ENCOUNTER — Telehealth: Payer: Self-pay | Admitting: Obstetrics and Gynecology

## 2018-09-30 NOTE — Telephone Encounter (Signed)
I spoke with patient and scheduled next week to see Dr. Amalia Hailey.

## 2018-09-30 NOTE — Telephone Encounter (Signed)
The patient called and stated that she would like to change her appointment that is tomorrow to today if possible, Pt is requesting a call back today to confirm if she is able to move apt and is willing to come in any time as well. Please advise.

## 2018-10-01 ENCOUNTER — Encounter: Payer: Medicare Other | Admitting: Obstetrics and Gynecology

## 2018-10-04 ENCOUNTER — Other Ambulatory Visit: Payer: Self-pay | Admitting: Acute Care

## 2018-10-04 DIAGNOSIS — M5030 Other cervical disc degeneration, unspecified cervical region: Secondary | ICD-10-CM

## 2018-10-05 ENCOUNTER — Encounter: Payer: Self-pay | Admitting: Internal Medicine

## 2018-10-05 ENCOUNTER — Telehealth: Payer: Self-pay | Admitting: *Deleted

## 2018-10-05 ENCOUNTER — Other Ambulatory Visit: Payer: Self-pay | Admitting: Surgery

## 2018-10-05 ENCOUNTER — Other Ambulatory Visit: Payer: Self-pay | Admitting: Orthopedic Surgery

## 2018-10-05 ENCOUNTER — Ambulatory Visit (INDEPENDENT_AMBULATORY_CARE_PROVIDER_SITE_OTHER): Payer: Medicare Other | Admitting: Internal Medicine

## 2018-10-05 VITALS — BP 132/84 | HR 65 | Resp 16 | Ht 65.0 in | Wt 261.0 lb

## 2018-10-05 DIAGNOSIS — G2581 Restless legs syndrome: Secondary | ICD-10-CM | POA: Diagnosis not present

## 2018-10-05 DIAGNOSIS — I10 Essential (primary) hypertension: Secondary | ICD-10-CM | POA: Diagnosis not present

## 2018-10-05 DIAGNOSIS — G4733 Obstructive sleep apnea (adult) (pediatric): Secondary | ICD-10-CM | POA: Diagnosis not present

## 2018-10-05 DIAGNOSIS — M1711 Unilateral primary osteoarthritis, right knee: Secondary | ICD-10-CM

## 2018-10-05 NOTE — Patient Instructions (Signed)

## 2018-10-05 NOTE — Progress Notes (Signed)
[facility] [place], [place] [address]  Pulmonary Sleep Medicine   Office Visit Note  Patient Name: Whitney [name] DOB: November 30, 1950 MRN [id number]  Date of Service: 10/05/2018  Complaints/HPI: Pt is here for follow up on sleep study that she had done in April 2018.  Patient reports she had a terrible experience during sleep study and then in June left to go to [place] to take care for members through Christmas.  She is now back and is ready to move forward with her CPAP machine.  She is in need of a titration study as she has gained a little bit of weight since her PSG.  We will get titration study and follow-up with patient in a few weeks.  ROS  General: (-) fever, (-) chills, (-) night sweats, (-) weakness Skin: (-) rashes, (-) itching,. Eyes: (-) visual changes, (-) redness, (-) itching. Nose and Sinuses: (-) nasal stuffiness or itchiness, (-) postnasal drip, (-) nosebleeds, (-) sinus trouble. Mouth and Throat: (-) sore throat, (-) hoarseness. Neck: (-) swollen glands, (-) enlarged thyroid, (-) neck pain. Respiratory: - cough, (-) bloody sputum, - shortness of breath, - wheezing. Cardiovascular: - ankle swelling, (-) chest pain. Lymphatic: (-) lymph node enlargement. Neurologic: (-) numbness, (-) tingling. Psychiatric: (-) anxiety, (-) depression   Current Medication: Outpatient Encounter Medications as of 10/05/2018  Medication Sig  . acetaminophen (TYLENOL) 325 MG tablet Take 650 mg by mouth 4 (four) times daily as needed.   Marland Kitchen amLODipine (NORVASC) 5 MG tablet amlodipine 5 mg tablet  . aspirin EC 81 MG tablet Take 1 tablet (81 mg total) by mouth daily.  Marland Kitchen atorvastatin (LIPITOR) 40 MG tablet Take 1 tablet (40 mg total) by mouth daily at 6 PM.  . carvedilol (COREG) 3.125 MG tablet Take 1 tablet (3.125 mg total) by mouth 2 (two) times daily with a meal.  . diclofenac sodium (VOLTAREN) 1 % GEL   . gabapentin (NEURONTIN) 100 MG capsule Take 1 capsule (100  mg total) by mouth 2 (two) times daily.  . hydrALAZINE (APRESOLINE) 10 MG tablet Take 1 tablet (10 mg total) by mouth 3 (three) times daily.  Marland Kitchen nystatin-triamcinolone ointment (MYCOLOG) Apply 1 application topically 2 (two) times daily.  Marland Kitchen rOPINIRole (REQUIP) 0.5 MG tablet Takes 1 or 2 tablets daily as needed  . traMADol (ULTRAM) 50 MG tablet Take 1 tablet (50 mg total) by mouth every 6 (six) hours as needed.  . traZODone (DESYREL) 50 MG tablet Take 1 tablet (50 mg total) by mouth at bedtime as needed for sleep.   No facility-administered encounter medications on file as of 10/05/2018.     Surgical History: Past Surgical History:  Procedure Laterality Date  . HYSTEROSCOPY W/D&C N/A 02/16/2017   Procedure: DILATATION AND CURETTAGE /HYSTEROSCOPY;  Surgeon: Defrancesco, Alanda Slim, MD;  Location: [facility] ORS;  Service: Gynecology;  Laterality: N/A;  . KIDNEY SURGERY Left 1979   left kidney removed  . LEFT HEART CATH AND CORONARY ANGIOGRAPHY N/A 10/21/2017   Procedure: LEFT HEART CATH AND CORONARY ANGIOGRAPHY;  Surgeon: Teodoro Spray, MD;  Location: [facility];  Service: Cardiovascular;  Laterality: N/A;  . TOTAL KNEE ARTHROPLASTY Left 07/06/2017   Procedure: TOTAL KNEE ARTHROPLASTY;  Surgeon: Lovell Sheehan, MD;  Location: [facility] ORS;  Service: Orthopedics;  Laterality: Left;  . WRIST ARTHROSCOPY Left 1970s    Medical History: Past Medical History:  Diagnosis Date  . Abnormal Pap smear of cervix    Pt states  she had colposcopy   . Arthritis   . Chronic kidney disease   . Hx of unilateral nephrectomy 1979   Left Nephrectomy  . Hypertension   . PMB (postmenopausal bleeding)   . Restless leg syndrome   . Vitamin D deficiency     Family History: Family History  Problem Relation Age of Onset  . Ovarian cancer Mother   . Hypertension Father   . Diabetes Father   . Cancer Father   . Breast cancer Sister   . Diabetes Sister     Social History: Social History   Socioeconomic  History  . Marital status: Divorced    Spouse name: Not on file  . Number of children: Not on file  . Years of education: Not on file  . Highest education level: Not on file  Occupational History  . Not on file  Social Needs  . Financial resource strain: Not on file  . Food insecurity:    Worry: Not on file    Inability: Not on file  . Transportation needs:    Medical: Not on file    Non-medical: Not on file  Tobacco Use  . Smoking status: Never Smoker  . Smokeless tobacco: Never Used  Substance and Sexual Activity  . Alcohol use: Yes    Comment: occasionally  . Drug use: Yes    Frequency: 3.0 times per week    Types: Marijuana    Comment: Pt states she uses for leg pain: marijuana; states no longer uses cocaine  . Sexual activity: Yes    Birth control/protection: None  Lifestyle  . Physical activity:    Days per week: Not on file    Minutes per session: Not on file  . Stress: Not on file  Relationships  . Social connections:    Talks on phone: Not on file    Gets together: Not on file    Attends religious service: Not on file    Active member of club or organization: Not on file    Attends meetings of clubs or organizations: Not on file    Relationship status: Not on file  . Intimate partner violence:    Fear of current or ex partner: Not on file    Emotionally abused: Not on file    Physically abused: Not on file    Forced sexual activity: Not on file  Other Topics Concern  . Not on file  Social History Narrative  . Not on file    Vital Signs: Blood pressure 132/84, [name] 65, resp. rate 16, height 5\' 5"  (1.651 m), weight 261 lb (118.4 kg), last menstrual period 01/26/2017, SpO2 94 %.  Examination: General Appearance: The patient is well-developed, well-nourished, and in no distress. Skin: Gross inspection of skin unremarkable. Head: normocephalic, no gross deformities. Eyes: no gross deformities noted. ENT: ears appear grossly normal no exudates. Neck:  Supple. No thyromegaly. No LAD. Respiratory: clear bilaterally. Cardiovascular: Normal S1 and S2 without murmur or rub. Extremities: No cyanosis. pulses are equal. Neurologic: Alert and oriented. No involuntary movements.  LABS: Recent Results (from the past 2160 hour(s))  Comprehensive metabolic panel     Status: Abnormal   Collection Time: 09/27/18 10:36 AM  Result Value Ref Range   Glucose 88 65 - 99 mg/dL   BUN 25 8 - 27 mg/dL   Creatinine, Ser 1.20 (H) 0.57 - 1.00 mg/dL   GFR calc non Af Amer 47 (L) >59 mL/min/1.73   GFR calc Af Amer 54 (L) >  59 mL/min/1.73   BUN/Creatinine Ratio 21 12 - 28   Sodium 144 134 - 144 mmol/L   Potassium 4.9 3.5 - 5.2 mmol/L   Chloride 106 96 - 106 mmol/L   CO2 23 20 - 29 mmol/L   Calcium 8.6 (L) 8.7 - 10.3 mg/dL   Total Protein 6.5 6.0 - 8.5 g/dL   Albumin 3.5 (L) 3.8 - 4.8 g/dL    Comment:               **Please note reference interval change**   Globulin, Total 3.0 1.5 - 4.5 g/dL   Albumin/Globulin Ratio 1.2 1.2 - 2.2   Bilirubin Total 0.3 0.0 - 1.2 mg/dL   Alkaline Phosphatase 102 39 - 117 IU/L   AST 14 0 - 40 IU/L   ALT 12 0 - 32 IU/L  Urinalysis, Routine w reflex microscopic     Status: Abnormal   Collection Time: 09/27/18 10:36 AM  Result Value Ref Range   Specific Gravity, UA 1.017 1.005 - 1.030   pH, UA 5.5 5.0 - 7.5   Color, UA Yellow Yellow   Appearance Ur Cloudy (A) Clear   Leukocytes, UA 2+ (A) Negative   Protein, UA 3+ (A) Negative/Trace   Glucose, UA Negative Negative   Ketones, UA Negative Negative   RBC, UA Negative Negative   Bilirubin, UA Negative Negative   Urobilinogen, Ur 0.2 0.2 - 1.0 mg/dL   Nitrite, UA Negative Negative   Microscopic Examination See below:     Comment: Microscopic was indicated and was performed.  Microscopic Examination     Status: Abnormal   Collection Time: 09/27/18 10:36 AM  Result Value Ref Range   WBC, UA 6-10 (A) 0 - 5 /hpf   RBC, UA 3-10 (A) 0 - 2 /hpf   Epithelial Cells (non renal)  >10 (A) 0 - 10 /hpf   Casts None seen None seen /lpf   Mucus, UA Present Not Estab.   Bacteria, UA Few None seen/Few  CBC     Status: None   Collection Time: 09/27/18 10:36 AM  Result Value Ref Range   WBC 5.8 3.4 - 10.8 x10E3/uL   RBC 4.66 3.77 - 5.28 x10E6/uL   Hemoglobin 13.4 11.1 - 15.9 g/dL   Hematocrit 40.7 34.0 - 46.6 %   MCV 87 79 - 97 fL   MCH 28.8 26.6 - 33.0 pg   MCHC 32.9 31.5 - 35.7 g/dL   RDW 12.6 11.7 - 15.4 %   Platelets 264 150 - 450 x10E3/uL  Hgb A1c w/o eAG     Status: Abnormal   Collection Time: 09/27/18 10:36 AM  Result Value Ref Range   Hgb A1c MFr Bld 5.8 (H) 4.8 - 5.6 %    Comment:          Prediabetes: 5.7 - 6.4          Diabetes: >6.4          Glycemic control for adults with diabetes: <7.0   T4, free     Status: None   Collection Time: 09/27/18 10:36 AM  Result Value Ref Range   Free T4 0.90 0.82 - 1.77 ng/dL  TSH     Status: None   Collection Time: 09/27/18 10:36 AM  Result Value Ref Range   TSH 1.730 0.450 - 4.500 uIU/mL  T3     Status: None   Collection Time: 09/27/18 10:36 AM  Result Value Ref Range   T3, Total 104 71 -  180 ng/dL    Radiology: Ct Knee Right Wo Contrast  Result Date: 09/28/2018 CLINICAL DATA:  Preop for total knee arthroplasty. EXAM: CT OF THE right KNEE WITHOUT CONTRAST TECHNIQUE: Multidetector CT imaging of the right knee was performed according to the standard protocol. Multiplanar CT image reconstructions were also generated. COMPARISON:  None. FINDINGS: Right hip: Axial images do not demonstrate any significant acute bony findings or evidence of AVN. No significant degenerative changes. There is a large intramuscular lipoma noted in the anterior compartment. Right knee: Advanced tricompartmental degenerative changes most severe in the medial compartment. There is significant joint space narrowing, full-thickness cartilage loss, bony eburnation, osteophytic spurring and subchondral cystic change. No fracture or AVN. Small  joint effusion. There are loose ossified bodies in the joint which could suggest synovial osteochondrosis. No findings for chondrocalcinosis. Right ankle: Axial images demonstrate minimal degenerative changes. No osteochondral lesion or fracture. IMPRESSION: 1. Severe tricompartmental degenerative changes involving the knee joint, most notable in the medial compartment. 2. The right hip and ankle joints are maintained. No significant degenerative changes or acute findings. Electronically Signed   By: Marijo Sanes M.D.   On: 09/28/2018 12:50    No results found.  Ct Knee Right Wo Contrast  Result Date: 09/28/2018 CLINICAL DATA:  Preop for total knee arthroplasty. EXAM: CT OF THE right KNEE WITHOUT CONTRAST TECHNIQUE: Multidetector CT imaging of the right knee was performed according to the standard protocol. Multiplanar CT image reconstructions were also generated. COMPARISON:  None. FINDINGS: Right hip: Axial images do not demonstrate any significant acute bony findings or evidence of AVN. No significant degenerative changes. There is a large intramuscular lipoma noted in the anterior compartment. Right knee: Advanced tricompartmental degenerative changes most severe in the medial compartment. There is significant joint space narrowing, full-thickness cartilage loss, bony eburnation, osteophytic spurring and subchondral cystic change. No fracture or AVN. Small joint effusion. There are loose ossified bodies in the joint which could suggest synovial osteochondrosis. No findings for chondrocalcinosis. Right ankle: Axial images demonstrate minimal degenerative changes. No osteochondral lesion or fracture. IMPRESSION: 1. Severe tricompartmental degenerative changes involving the knee joint, most notable in the medial compartment. 2. The right hip and ankle joints are maintained. No significant degenerative changes or acute findings. Electronically Signed   By: Marijo Sanes M.D.   On: 09/28/2018 12:50       Assessment and Plan: Patient Active Problem List   Diagnosis Date Noted  . Primary osteoarthritis of right knee 09/15/2018  . Encounter for pre-operative examination 09/15/2018  . Obstructive sleep apnea 09/15/2018  . Calculus of gallbladder without cholecystitis without obstruction 09/15/2018  . Calculus of gallbladder with cholecystitis without biliary obstruction 01/27/2018  . Restless leg syndrome 01/27/2018  . Generalized abdominal pain 01/11/2018  . Cyst of pancreas 01/11/2018  . Other specified disorders of kidney and ureter 01/11/2018  . Primary insomnia 01/11/2018  . Paroxysmal atrial fibrillation ([place]) 11/12/2017  . Essential hypertension 10/25/2017  . Mixed hyperlipidemia 10/25/2017  . Low back pain at multiple sites 10/25/2017  . Dysuria 10/25/2017  . Encounter for general adult medical examination with abnormal findings 10/25/2017  . Vitamin D deficiency 10/25/2017  . Near syncope   . Symptomatic bradycardia 10/19/2017  . Carpal tunnel syndrome 10/12/2017  . Primary osteoarthritis of left knee 07/09/2017  . Total knee replacement status, left 07/06/2017  . Chest pain 06/29/2017  . Postop check 02/25/2017  . Impingement syndrome of shoulder region 01/20/2017  . Neck pain 01/20/2017  .  Obesity (BMI 35.0-39.9 without comorbidity) 07/15/2016  . Endometrial polyp 07/15/2016  . Morbid obesity ([place]) 07/15/2016  . Family history of breast cancer in first degree relative 07/15/2016  . Family history of ovarian cancer 07/15/2016  . Knee pain 07/04/2016  . Bilateral leg pain 06/24/2016   1. OSA (obstructive sleep apnea) CPAP titration study ordered to assess patient's pressure level for machine. - Cpap titration; Future  2. Essential hypertension Stable, blood pressure today 132/84.  Continue using medications as prescribed.  3. Restless leg syndrome Stable, continue current therapy.   General Counseling: I have discussed the findings of the evaluation and  examination with Whitney [name].  I have also discussed any further diagnostic evaluation thatmay be needed or ordered today. Jaidan verbalizes understanding of the findings of todays visit. We also reviewed her medications today and discussed drug interactions and side effects including but not limited excessive drowsiness and altered mental states. We also discussed that there is always a risk not just to her but also people around her. she has been encouraged to call the office with any questions or concerns that should arise related to todays visit.    Time spent:  25 This patient was seen by Orson Gear AGNP-C in Collaboration with Dr. Devona Konig as a part of collaborative care agreement.   I have personally obtained a history, examined the patient, evaluated laboratory and imaging results, formulated the assessment and plan and placed orders.    Allyne Gee, MD [facility] Pulmonary and Critical Care Sleep medicine

## 2018-10-05 NOTE — Telephone Encounter (Signed)
Patient called and stated that the hospital called her to cancel the hida scan that was schedule for tomorrow due to no authorization. Please contact patient in regards to this.

## 2018-10-06 ENCOUNTER — Ambulatory Visit
Admission: RE | Admit: 2018-10-06 | Discharge: 2018-10-06 | Disposition: A | Discharge status: Home / Self Care | Payer: Medicare Other | Source: Ambulatory Visit | Attending: Orthopedic Surgery | Admitting: Orthopedic Surgery

## 2018-10-06 ENCOUNTER — Telehealth: Payer: Self-pay

## 2018-10-06 DIAGNOSIS — R936 Abnormal findings on diagnostic imaging of limbs: Secondary | ICD-10-CM | POA: Diagnosis not present

## 2018-10-06 DIAGNOSIS — M1711 Unilateral primary osteoarthritis, right knee: Secondary | ICD-10-CM | POA: Diagnosis not present

## 2018-10-06 NOTE — Telephone Encounter (Signed)
I have called patient in reference to the authorization status of her HIDA scan. No answer. I have left a detailed message on patient's voicemail that the authorization is still pending medical review. I also stated that I have contacted [facility] and that it is being reviewed at this time. I will also call patient when authorization has been obtained.

## 2018-10-06 NOTE — Telephone Encounter (Signed)
Faxed Sleep Study order form to [facility]. Beth

## 2018-10-07 ENCOUNTER — Encounter: Payer: Self-pay | Admitting: Obstetrics and Gynecology

## 2018-10-07 ENCOUNTER — Ambulatory Visit (INDEPENDENT_AMBULATORY_CARE_PROVIDER_SITE_OTHER): Payer: Medicare Other | Admitting: Obstetrics and Gynecology

## 2018-10-07 VITALS — BP 140/88 | HR 89 | Ht 65.0 in | Wt 260.1 lb

## 2018-10-07 DIAGNOSIS — B9689 Other specified bacterial agents as the cause of diseases classified elsewhere: Secondary | ICD-10-CM

## 2018-10-07 DIAGNOSIS — N760 Acute vaginitis: Secondary | ICD-10-CM

## 2018-10-07 MED ORDER — METRONIDAZOLE 500 MG PO TABS: 500.0000 mg | ORAL_TABLET | Freq: Two times a day (BID) | ORAL | 0 refills | Status: AC

## 2018-10-07 NOTE — Progress Notes (Signed)
HPI:      [name] is a 68 y.o. G2P1011 who LMP was Patient's last menstrual period was 01/26/2017.  Subjective:   She presents today stating that she has recently begun intercourse again and that she feels as if she has any irritation or a "scratch" just inside her vagina.  She reports that she has noticed some vaginal dryness since beginning intercourse.  She denies vaginal bleeding.  She has preemptively declined STD screening.    Hx: The following portions of the patient's history were reviewed and updated as appropriate:             She  has a past medical history of Abnormal Pap smear of cervix, Arthritis, Chronic kidney disease, unilateral nephrectomy (1979), Hypertension, PMB (postmenopausal bleeding), Restless leg syndrome, and Vitamin D deficiency. She does not have any pertinent problems on file. She  has a past surgical history that includes Kidney surgery (Left, 1979); Wrist arthroscopy (Left, 1970s); Hysteroscopy w/D&C (N/A, 02/16/2017); LEFT HEART CATH AND CORONARY ANGIOGRAPHY (N/A, 10/21/2017); and Total knee arthroplasty (Left, 07/06/2017). Her family history includes Breast cancer in her sister; Cancer in her father; Diabetes in her father and sister; Hypertension in her father; Ovarian cancer in her mother. She  reports that she has never smoked. She has never used smokeless tobacco. She reports current alcohol use. She reports current drug use. Frequency: 3.00 times per week. Drug: Marijuana. She has a current medication list which includes the following prescription(s): acetaminophen, amlodipine, aspirin ec, atorvastatin, carvedilol, diclofenac sodium, gabapentin, hydralazine, nystatin-triamcinolone ointment, ropinirole, tramadol, trazodone, and metronidazole. She is allergic to enalapril and lisinopril.       Review of Systems:  Review of Systems  Constitutional: Denied constitutional symptoms, night sweats, recent illness, fatigue, fever, insomnia and weight loss.   Eyes: Denied eye symptoms, eye pain, photophobia, vision change and visual disturbance.  Ears/Nose/Throat/Neck: Denied ear, nose, throat or neck symptoms, hearing loss, nasal discharge, sinus congestion and sore throat.  Cardiovascular: Denied cardiovascular symptoms, arrhythmia, chest pain/pressure, edema, exercise intolerance, orthopnea and palpitations.  Respiratory: Denied pulmonary symptoms, asthma, pleuritic pain, productive sputum, cough, dyspnea and wheezing.  Gastrointestinal: Denied, gastro-esophageal reflux, melena, nausea and vomiting.  Genitourinary: See HPI for additional information.  Musculoskeletal: Denied musculoskeletal symptoms, stiffness, swelling, muscle weakness and myalgia.  Dermatologic: Denied dermatology symptoms, rash and scar.  Neurologic: Denied neurology symptoms, dizziness, headache, neck pain and syncope.  Psychiatric: Denied psychiatric symptoms, anxiety and depression.  Endocrine: Denied endocrine symptoms including hot flashes and night sweats.   Meds:   Current Outpatient Medications on File Prior to Visit  Medication Sig Dispense Refill  . acetaminophen (TYLENOL) 325 MG tablet Take 650 mg by mouth 4 (four) times daily as needed.     Marland Kitchen amLODipine (NORVASC) 5 MG tablet amlodipine 5 mg tablet    . aspirin EC 81 MG tablet Take 1 tablet (81 mg total) by mouth daily. 30 tablet 0  . atorvastatin (LIPITOR) 40 MG tablet Take 1 tablet (40 mg total) by mouth daily at 6 PM. 90 tablet 1  . carvedilol (COREG) 3.125 MG tablet Take 1 tablet (3.125 mg total) by mouth 2 (two) times daily with a meal. 60 tablet 3  . diclofenac sodium (VOLTAREN) 1 % GEL   0  . gabapentin (NEURONTIN) 100 MG capsule Take 1 capsule (100 mg total) by mouth 2 (two) times daily. 90 capsule 0  . hydrALAZINE (APRESOLINE) 10 MG tablet Take 1 tablet (10 mg total) by mouth 3 (three) times  daily. 270 tablet 1  . nystatin-triamcinolone ointment (MYCOLOG) Apply 1 application topically 2 (two) times  daily. 30 g 0  . rOPINIRole (REQUIP) 0.5 MG tablet Takes 1 or 2 tablets daily as needed 90 tablet 1  . traMADol (ULTRAM) 50 MG tablet Take 1 tablet (50 mg total) by mouth every 6 (six) hours as needed. 20 tablet 0  . traZODone (DESYREL) 50 MG tablet Take 1 tablet (50 mg total) by mouth at bedtime as needed for sleep. 30 tablet 3   No current facility-administered medications on file prior to visit.     Objective:     Vitals:   10/07/18 0954  BP: 140/88  Pulse: 89              Physical examination   Pelvic:   Vulva: Normal appearance.  No lesions.  Vagina: No lesions or abnormalities noted.  Support: Normal pelvic support.  Urethra No masses tenderness or scarring.  Meatus Normal size without lesions or prolapse.  Cervix: Normal appearance.  No lesions.  Anus: Normal exam.  No lesions.  Perineum: Normal exam.  No lesions.        Bimanual   Uterus: Normal size.  Non-tender.  Mobile.  AV.  Adnexae: No masses.  Non-tender to palpation.  Cul-de-sac: Negative for abnormality.   Exam limited by patient body habitus  WET PREP: clue cells: present, KOH (yeast): negative, odor: present and trichomoniasis: negative Ph:  < 4.5   Assessment:    G2P1011 Patient Active Problem List   Diagnosis Date Noted  . Primary osteoarthritis of right knee 09/15/2018  . Encounter for pre-operative examination 09/15/2018  . Obstructive sleep apnea 09/15/2018  . Calculus of gallbladder without cholecystitis without obstruction 09/15/2018  . Calculus of gallbladder with cholecystitis without biliary obstruction 01/27/2018  . Restless leg syndrome 01/27/2018  . Generalized abdominal pain 01/11/2018  . Cyst of pancreas 01/11/2018  . Other specified disorders of kidney and ureter 01/11/2018  . Primary insomnia 01/11/2018  . Paroxysmal atrial fibrillation ([place]) 11/12/2017  . Essential hypertension 10/25/2017  . Mixed hyperlipidemia 10/25/2017  . Low back pain at multiple sites 10/25/2017  .  Dysuria 10/25/2017  . Encounter for general adult medical examination with abnormal findings 10/25/2017  . Vitamin D deficiency 10/25/2017  . Near syncope   . Symptomatic bradycardia 10/19/2017  . Carpal tunnel syndrome 10/12/2017  . Primary osteoarthritis of left knee 07/09/2017  . Total knee replacement status, left 07/06/2017  . Chest pain 06/29/2017  . Postop check 02/25/2017  . Impingement syndrome of shoulder region 01/20/2017  . Neck pain 01/20/2017  . Obesity (BMI 35.0-39.9 without comorbidity) 07/15/2016  . Endometrial polyp 07/15/2016  . Morbid obesity ([place]) 07/15/2016  . Family history of breast cancer in first degree relative 07/15/2016  . Family history of ovarian cancer 07/15/2016  . Knee pain 07/04/2016  . Bilateral leg pain 06/24/2016     1. Bacterial vulvovaginitis     I also suspect that she has some irritation from vaginal dryness and recent intercourse.   Plan:            1.  Flagyl for BV  2.  Use of lubricants during intercourse discussed in detail.  Necessity in menopause.  Possible future use of estrogen vaginal cream should problems continue. Orders No orders of the defined types were placed in this encounter.    Meds ordered this encounter  Medications  . metroNIDAZOLE (FLAGYL) 500 MG tablet    Sig: Take 1 tablet (  500 mg total) by mouth 2 (two) times daily for 7 days.    Dispense:  14 tablet    Refill:  0      F/U  Return for Annual Physical. I spent 18 minutes involved in the care of this patient of which greater than 50% was spent discussing BV, natural course and history, probiotic use, use of lubricants during intercourse, vaginal atrophy and menopause, possible future use of vaginal estrogen if needed.  Finis Bud, M.D. 10/07/2018 10:55 AM

## 2018-10-07 NOTE — Progress Notes (Signed)
Patient comes in today for vaginal itching. Patient states that she had intercourse with her friend and since then she has had burning. Patient states that she feels like there is a scratch on the inside of the vaginal lip. She has stopped using soap so it does not make it worse.

## 2018-10-11 ENCOUNTER — Ambulatory Visit: Payer: Medicare Other

## 2018-10-11 NOTE — Addendum Note (Signed)
Addended by: Devona Konig on: 10/11/2018 11:58 AM   Modules accepted: Level of Service

## 2018-10-13 ENCOUNTER — Other Ambulatory Visit: Payer: Self-pay

## 2018-10-13 ENCOUNTER — Ambulatory Visit
Admission: RE | Admit: 2018-10-13 | Discharge: 2018-10-13 | Disposition: A | Discharge status: Home / Self Care | Payer: Medicare Other | Source: Ambulatory Visit | Attending: Acute Care | Admitting: Acute Care

## 2018-10-13 DIAGNOSIS — M5030 Other cervical disc degeneration, unspecified cervical region: Secondary | ICD-10-CM | POA: Diagnosis present

## 2018-10-13 DIAGNOSIS — M542 Cervicalgia: Secondary | ICD-10-CM | POA: Diagnosis not present

## 2018-10-18 DIAGNOSIS — G4733 Obstructive sleep apnea (adult) (pediatric): Secondary | ICD-10-CM | POA: Diagnosis not present

## 2018-10-18 DIAGNOSIS — I10 Essential (primary) hypertension: Secondary | ICD-10-CM | POA: Diagnosis not present

## 2018-10-18 DIAGNOSIS — I480 Paroxysmal atrial fibrillation: Secondary | ICD-10-CM | POA: Diagnosis not present

## 2018-10-18 DIAGNOSIS — I493 Ventricular premature depolarization: Secondary | ICD-10-CM

## 2018-10-18 HISTORY — DX: Ventricular premature depolarization: I49.3

## 2018-10-19 ENCOUNTER — Other Ambulatory Visit: Payer: Self-pay

## 2018-10-19 ENCOUNTER — Encounter
Admission: RE | Admit: 2018-10-19 | Discharge: 2018-10-19 | Disposition: A | Discharge status: Home / Self Care | Payer: Medicare Other | Source: Ambulatory Visit | Attending: General Surgery | Admitting: General Surgery

## 2018-10-19 DIAGNOSIS — G8929 Other chronic pain: Secondary | ICD-10-CM | POA: Diagnosis not present

## 2018-10-19 DIAGNOSIS — R1011 Right upper quadrant pain: Secondary | ICD-10-CM | POA: Diagnosis not present

## 2018-10-19 DIAGNOSIS — M25512 Pain in left shoulder: Secondary | ICD-10-CM | POA: Diagnosis not present

## 2018-10-19 DIAGNOSIS — M5030 Other cervical disc degeneration, unspecified cervical region: Secondary | ICD-10-CM | POA: Diagnosis not present

## 2018-10-19 DIAGNOSIS — R110 Nausea: Secondary | ICD-10-CM | POA: Diagnosis not present

## 2018-10-19 DIAGNOSIS — M5412 Radiculopathy, cervical region: Secondary | ICD-10-CM | POA: Diagnosis not present

## 2018-10-19 MED ORDER — TECHNETIUM TC 99M MEBROFENIN IV KIT
5.0000 | PACK | Freq: Once | INTRAVENOUS | Status: AC | PRN
  Administered 2018-10-19: 5.52 via INTRAVENOUS

## 2018-10-21 ENCOUNTER — Encounter: Payer: Self-pay | Admitting: Obstetrics and Gynecology

## 2018-10-21 ENCOUNTER — Ambulatory Visit (INDEPENDENT_AMBULATORY_CARE_PROVIDER_SITE_OTHER): Payer: Medicare Other | Admitting: Obstetrics and Gynecology

## 2018-10-21 ENCOUNTER — Other Ambulatory Visit: Payer: Self-pay

## 2018-10-21 ENCOUNTER — Telehealth: Payer: Self-pay

## 2018-10-21 ENCOUNTER — Other Ambulatory Visit (HOSPITAL_COMMUNITY)
Admission: RE | Admit: 2018-10-21 | Discharge: 2018-10-21 | Disposition: A | Discharge status: Home / Self Care | Payer: Medicare Other | Source: Ambulatory Visit | Attending: Obstetrics and Gynecology | Admitting: Obstetrics and Gynecology

## 2018-10-21 VITALS — BP 153/87 | HR 81 | Ht 65.0 in | Wt 255.2 lb

## 2018-10-21 DIAGNOSIS — Z124 Encounter for screening for malignant neoplasm of cervix: Secondary | ICD-10-CM | POA: Insufficient documentation

## 2018-10-21 DIAGNOSIS — Z0000 Encounter for general adult medical examination without abnormal findings: Secondary | ICD-10-CM | POA: Diagnosis not present

## 2018-10-21 DIAGNOSIS — Z780 Asymptomatic menopausal state: Secondary | ICD-10-CM | POA: Insufficient documentation

## 2018-10-21 NOTE — Progress Notes (Signed)
Patient comes in today for her annual exam. She has no concerns today. She has her labs and mammogram through her PCP. She is due for Pap today.

## 2018-10-21 NOTE — Telephone Encounter (Signed)
-----   Message from Robert Bellow, MD sent at 10/20/2018  4:33 PM EDT ----- Please notify the patient her gallbladder function test shows the gallbladder is functioning well.  Would not recommend gallbladder removal at this time.  ----- Message ----- From: Interface, Rad Results In Sent: 10/19/2018  12:41 PM EDT To: Robert Bellow, MD

## 2018-10-21 NOTE — Addendum Note (Signed)
Addended by: Durwin Glaze on: 10/21/2018 11:12 AM   Modules accepted: Orders

## 2018-10-21 NOTE — Progress Notes (Signed)
HPI:      Whitney [name] is a 68 y.o. G2P1011 who LMP was Patient's last menstrual period was 01/26/2017.  Subjective:   She presents today for her annual examination.  She reports that her vaginal burning has resolved.  She has tried lubrication with intercourse and reports no problems.    Hx: The following portions of the patient's history were reviewed and updated as appropriate:             She  has a past medical history of Abnormal Pap smear of cervix, Arthritis, Chronic kidney disease, unilateral nephrectomy (1979), Hypertension, PMB (postmenopausal bleeding), Restless leg syndrome, and Vitamin D deficiency. She does not have any pertinent problems on file. She  has a past surgical history that includes Kidney surgery (Left, 1979); Wrist arthroscopy (Left, 1970s); Hysteroscopy w/D&C (N/A, 02/16/2017); LEFT HEART CATH AND CORONARY ANGIOGRAPHY (N/A, 10/21/2017); and Total knee arthroplasty (Left, 07/06/2017). Her family history includes Breast cancer in her sister; Cancer in her father; Diabetes in her father and sister; Hypertension in her father; Ovarian cancer in her mother. She  reports that she has never smoked. She has never used smokeless tobacco. She reports current alcohol use. She reports current drug use. Frequency: 3.00 times per week. Drug: Marijuana. She has a current medication list which includes the following prescription(s): acetaminophen, amlodipine, aspirin ec, atorvastatin, carvedilol, diclofenac sodium, gabapentin, hydralazine, nystatin-triamcinolone ointment, ropinirole, tramadol, and trazodone. She is allergic to enalapril and lisinopril.       Review of Systems:  Review of Systems  Constitutional: Denied constitutional symptoms, night sweats, recent illness, fatigue, fever, insomnia and weight loss.  Eyes: Denied eye symptoms, eye pain, photophobia, vision change and visual disturbance.  Ears/Nose/Throat/Neck: Denied ear, nose, throat or neck symptoms, hearing loss,  nasal discharge, sinus congestion and sore throat.  Cardiovascular: Denied cardiovascular symptoms, arrhythmia, chest pain/pressure, edema, exercise intolerance, orthopnea and palpitations.  Respiratory: Denied pulmonary symptoms, asthma, pleuritic pain, productive sputum, cough, dyspnea and wheezing.  Gastrointestinal: Denied, gastro-esophageal reflux, melena, nausea and vomiting.  Genitourinary: Denied genitourinary symptoms including symptomatic vaginal discharge, pelvic relaxation issues, and urinary complaints.  Musculoskeletal: Denied musculoskeletal symptoms, stiffness, swelling, muscle weakness and myalgia.  Dermatologic: Denied dermatology symptoms, rash and scar.  Neurologic: Denied neurology symptoms, dizziness, headache, neck pain and syncope.  Psychiatric: Denied psychiatric symptoms, anxiety and depression.  Endocrine: Denied endocrine symptoms including hot flashes and night sweats.   Meds:   Current Outpatient Medications on File Prior to Visit  Medication Sig Dispense Refill  . acetaminophen (TYLENOL) 325 MG tablet Take 650 mg by mouth 4 (four) times daily as needed.     Marland Kitchen amLODipine (NORVASC) 5 MG tablet amlodipine 5 mg tablet    . aspirin EC 81 MG tablet Take 1 tablet (81 mg total) by mouth daily. 30 tablet 0  . atorvastatin (LIPITOR) 40 MG tablet Take 1 tablet (40 mg total) by mouth daily at 6 PM. 90 tablet 1  . carvedilol (COREG) 3.125 MG tablet Take 1 tablet (3.125 mg total) by mouth 2 (two) times daily with a meal. 60 tablet 3  . diclofenac sodium (VOLTAREN) 1 % GEL   0  . gabapentin (NEURONTIN) 100 MG capsule Take 1 capsule (100 mg total) by mouth 2 (two) times daily. 90 capsule 0  . hydrALAZINE (APRESOLINE) 10 MG tablet Take 1 tablet (10 mg total) by mouth 3 (three) times daily. 270 tablet 1  . nystatin-triamcinolone ointment (MYCOLOG) Apply 1 application topically 2 (two) times daily. 30 g  0  . rOPINIRole (REQUIP) 0.5 MG tablet Takes 1 or 2 tablets daily as needed 90  tablet 1  . traMADol (ULTRAM) 50 MG tablet Take 1 tablet (50 mg total) by mouth every 6 (six) hours as needed. 20 tablet 0  . traZODone (DESYREL) 50 MG tablet Take 1 tablet (50 mg total) by mouth at bedtime as needed for sleep. 30 tablet 3   No current facility-administered medications on file prior to visit.     Objective:     Vitals:   10/21/18 1013  BP: (!) 153/87  Pulse: 81              Physical examination General NAD, Conversant  HEENT Atraumatic; Op clear with mmm.  Normo-cephalic. Pupils reactive. Anicteric sclerae  Thyroid/Neck Smooth without nodularity or enlargement. Normal ROM.  Neck Supple.  Skin No rashes, lesions or ulceration. Normal palpated skin turgor. No nodularity.  Breasts: No masses or discharge.  Symmetric.  No axillary adenopathy.  Lungs: Clear to auscultation.No rales or wheezes. Normal Respiratory effort, no retractions.  Heart: NSR.  No murmurs or rubs appreciated. No periferal edema  Abdomen: Soft.  Non-tender.  No masses.  No HSM. No hernia  Extremities: Moves all appropriately.  Normal ROM for age. No lymphadenopathy.  Neuro: Oriented to PPT.  Normal mood. Normal affect.     Pelvic:   Vulva: Normal appearance.  No lesions.  Vagina: No lesions or abnormalities noted.  Support: Normal pelvic support.  Urethra No masses tenderness or scarring.  Meatus Normal size without lesions or prolapse.  Cervix: Normal appearance.  No lesions.  Anus: Normal exam.  No lesions.  Perineum: Normal exam.  No lesions.        Bimanual   Uterus: Normal size.  Non-tender.  Mobile.  AV.  Adnexae: No masses.  Non-tender to palpation.  Cul-de-sac: Negative for abnormality.    Exam limited by patient body habitus  Assessment:    G2P1011 Patient Active Problem List   Diagnosis Date Noted  . Primary osteoarthritis of right knee 09/15/2018  . Encounter for pre-operative examination 09/15/2018  . Obstructive sleep apnea 09/15/2018  . Calculus of gallbladder without  cholecystitis without obstruction 09/15/2018  . Calculus of gallbladder with cholecystitis without biliary obstruction 01/27/2018  . Restless leg syndrome 01/27/2018  . Generalized abdominal pain 01/11/2018  . Cyst of pancreas 01/11/2018  . Other specified disorders of kidney and ureter 01/11/2018  . Primary insomnia 01/11/2018  . Paroxysmal atrial fibrillation ([place]) 11/12/2017  . Essential hypertension 10/25/2017  . Mixed hyperlipidemia 10/25/2017  . Low back pain at multiple sites 10/25/2017  . Dysuria 10/25/2017  . Encounter for general adult medical examination with abnormal findings 10/25/2017  . Vitamin D deficiency 10/25/2017  . Near syncope   . Symptomatic bradycardia 10/19/2017  . Carpal tunnel syndrome 10/12/2017  . Primary osteoarthritis of left knee 07/09/2017  . Total knee replacement status, left 07/06/2017  . Chest pain 06/29/2017  . Postop check 02/25/2017  . Impingement syndrome of shoulder region 01/20/2017  . Neck pain 01/20/2017  . Obesity (BMI 35.0-39.9 without comorbidity) 07/15/2016  . Endometrial polyp 07/15/2016  . Morbid obesity ([place]) 07/15/2016  . Family history of breast cancer in first degree relative 07/15/2016  . Family history of ovarian cancer 07/15/2016  . Knee pain 07/04/2016  . Bilateral leg pain 06/24/2016     1. Encounter for annual physical exam        Plan:  1.  Basic Screening Recommendations The basic screening recommendations for asymptomatic women were discussed with the patient during her visit.  The age-appropriate recommendations were discussed with her and the rational for the tests reviewed.  When I am informed by the patient that another primary care physician has previously obtained the age-appropriate tests and they are up-to-date, only outstanding tests are ordered and referrals given as necessary.  Abnormal results of tests will be discussed with her when all of her results are completed. Pap performed  today Patient states her family doctor orders mammograms and blood work. Orders No orders of the defined types were placed in this encounter.   No orders of the defined types were placed in this encounter.       F/U  Return in about 1 year (around 10/21/2019) for Annual Physical.  Finis Bud, M.D. 10/21/2018 11:01 AM

## 2018-10-22 NOTE — Telephone Encounter (Signed)
Notified patient as instructed, patient pleased. Discussed follow-up appointments, patient agrees  

## 2018-10-25 ENCOUNTER — Encounter: Payer: Self-pay | Admitting: Nurse Practitioner

## 2018-10-25 ENCOUNTER — Ambulatory Visit (INDEPENDENT_AMBULATORY_CARE_PROVIDER_SITE_OTHER): Payer: Medicare Other | Admitting: Nurse Practitioner

## 2018-10-25 ENCOUNTER — Other Ambulatory Visit: Payer: Self-pay

## 2018-10-25 VITALS — BP 158/84 | HR 69 | Resp 16 | Ht 65.0 in | Wt 255.0 lb

## 2018-10-25 DIAGNOSIS — I10 Essential (primary) hypertension: Secondary | ICD-10-CM

## 2018-10-25 DIAGNOSIS — R1084 Generalized abdominal pain: Secondary | ICD-10-CM | POA: Diagnosis not present

## 2018-10-25 DIAGNOSIS — R079 Chest pain, unspecified: Secondary | ICD-10-CM

## 2018-10-25 LAB — CYTOLOGY - PAP
Diagnosis: NEGATIVE
HPV: NOT DETECTED

## 2018-10-25 MED ORDER — CARVEDILOL 3.125 MG PO TABS: 6.2500 mg | ORAL_TABLET | Freq: Two times a day (BID) | ORAL | 3 refills | Status: DC

## 2018-10-25 MED ORDER — HYDRALAZINE HCL 10 MG PO TABS: 10.0000 mg | ORAL_TABLET | Freq: Three times a day (TID) | ORAL | 1 refills | Status: DC

## 2018-10-25 NOTE — Progress Notes (Signed)
[facility] [place], [place] [address]  Internal MEDICINE  Office Visit Note  Patient Name: Whitney [name]  [id number]  [id number]  Date of Service: 10/25/2018  Chief Complaint  Patient presents with  . Medical Management of Chronic Issues    6wk follow up  . Hypertension  . Labs Only    results    The patient is c/o chest tightness with palpitations, on and off for the past week or two. Has recently seen her cardiologist and had "good" ECG. These symptoms started since her last visit with him. Blood pressure is elevated today. Has seen surgery due to persistent and chronic abdominal pain. Does have gallstones present. HIDA scan done per surgery and was within normal limits. She should now see GI for persistent stomach pain.        Current Medication: Outpatient Encounter Medications as of 10/25/2018  Medication Sig  . acetaminophen (TYLENOL) 325 MG tablet Take 650 mg by mouth 4 (four) times daily as needed.   Marland Kitchen amLODipine (NORVASC) 5 MG tablet amlodipine 5 mg tablet  . atorvastatin (LIPITOR) 40 MG tablet Take 1 tablet (40 mg total) by mouth daily at 6 PM.  . carvedilol (COREG) 3.125 MG tablet Take 2 tablets (6.25 mg total) by mouth 2 (two) times daily with a meal.  . diclofenac sodium (VOLTAREN) 1 % GEL   . flecainide (TAMBOCOR) 150 MG tablet Take 150 mg by mouth 2 (two) times daily.  Marland Kitchen gabapentin (NEURONTIN) 100 MG capsule Take 1 capsule (100 mg total) by mouth 2 (two) times daily.  . hydrALAZINE (APRESOLINE) 10 MG tablet Take 1 tablet (10 mg total) by mouth 3 (three) times daily.  Marland Kitchen nystatin-triamcinolone ointment (MYCOLOG) Apply 1 application topically 2 (two) times daily.  Marland Kitchen rOPINIRole (REQUIP) 0.5 MG tablet Takes 1 or 2 tablets daily as needed  . traMADol (ULTRAM) 50 MG tablet Take 1 tablet (50 mg total) by mouth every 6 (six) hours as needed.  . traZODone (DESYREL) 50 MG tablet Take 1 tablet (50 mg total) by mouth at bedtime as needed for sleep.   . [DISCONTINUED] carvedilol (COREG) 3.125 MG tablet Take 1 tablet (3.125 mg total) by mouth 2 (two) times daily with a meal.  . [DISCONTINUED] hydrALAZINE (APRESOLINE) 10 MG tablet Take 1 tablet (10 mg total) by mouth 3 (three) times daily.   No facility-administered encounter medications on file as of 10/25/2018.     Surgical History: Past Surgical History:  Procedure Laterality Date  . HYSTEROSCOPY W/D&C N/A 02/16/2017   Procedure: DILATATION AND CURETTAGE /HYSTEROSCOPY;  Surgeon: Defrancesco, Alanda Slim, MD;  Location: [facility];  Service: Gynecology;  Laterality: N/A;  . KIDNEY SURGERY Left 1979   left kidney removed  . LEFT HEART CATH AND CORONARY ANGIOGRAPHY N/A 10/21/2017   Procedure: LEFT HEART CATH AND CORONARY ANGIOGRAPHY;  Surgeon: Teodoro Spray, MD;  Location: [facility];  Service: Cardiovascular;  Laterality: N/A;  . TOTAL KNEE ARTHROPLASTY Left 07/06/2017   Procedure: TOTAL KNEE ARTHROPLASTY;  Surgeon: Lovell Sheehan, MD;  Location: [facility];  Service: Orthopedics;  Laterality: Left;  . WRIST ARTHROSCOPY Left 1970s    Medical History: Past Medical History:  Diagnosis Date  . Abnormal Pap smear of cervix    Pt states she had colposcopy   . Arthritis   . Chronic kidney disease   . Hx of unilateral nephrectomy 1979   Left Nephrectomy  . Hypertension   . PMB (postmenopausal bleeding)   .  Restless leg syndrome   . Vitamin D deficiency     Family History: Family History  Problem Relation Age of Onset  . Ovarian cancer Mother   . Hypertension Father   . Diabetes Father   . Cancer Father   . Breast cancer Sister   . Diabetes Sister     Social History   Socioeconomic History  . Marital status: Divorced    Spouse name: Not on file  . Number of children: Not on file  . Years of education: Not on file  . Highest education level: Not on file  Occupational History  . Not on file  Social Needs  . Financial resource strain: Not on file  . Food  insecurity:    Worry: Not on file    Inability: Not on file  . Transportation needs:    Medical: Not on file    Non-medical: Not on file  Tobacco Use  . Smoking status: Never Smoker  . Smokeless tobacco: Never Used  Substance and Sexual Activity  . Alcohol use: Yes    Comment: occasionally  . Drug use: Yes    Frequency: 3.0 times per week    Types: Marijuana    Comment: Pt states she uses for leg pain: marijuana; states no longer uses cocaine  . Sexual activity: Yes    Birth control/protection: None  Lifestyle  . Physical activity:    Days per week: Not on file    Minutes per session: Not on file  . Stress: Not on file  Relationships  . Social connections:    Talks on phone: Not on file    Gets together: Not on file    Attends religious service: Not on file    Active member of club or organization: Not on file    Attends meetings of clubs or organizations: Not on file    Relationship status: Not on file  . Intimate partner violence:    Fear of current or ex partner: Not on file    Emotionally abused: Not on file    Physically abused: Not on file    Forced sexual activity: Not on file  Other Topics Concern  . Not on file  Social History Narrative  . Not on file      Review of Systems  Constitutional: Positive for activity change, diaphoresis and fatigue. Negative for chills and unexpected weight change.  HENT: Negative for congestion, postnasal drip, rhinorrhea, sneezing and sore throat.   Eyes: Negative for redness.  Respiratory: Negative for cough, chest tightness, shortness of breath and wheezing.   Cardiovascular: Positive for chest pain and palpitations.       Severely elevated blood pressure today.  Gastrointestinal: Positive for abdominal pain, diarrhea and nausea. Negative for constipation and vomiting.  Endocrine: Negative for cold intolerance, heat intolerance, polydipsia and polyuria.  Musculoskeletal: Positive for arthralgias, back pain and myalgias.  Negative for joint swelling and neck pain.  Skin: Negative for rash.  Allergic/Immunologic: Positive for environmental allergies.  Neurological: Positive for weakness. Negative for tremors and numbness.  Hematological: Negative for adenopathy. Does not bruise/bleed easily.  Psychiatric/Behavioral: Negative for behavioral problems (Depression), sleep disturbance and suicidal ideas. The patient is nervous/anxious.     Vital Signs: BP (!) 158/84 (BP Location: Right Arm, Patient Position: Sitting, Cuff Size: Large) Comment: 172/95 1st reading  Pulse 69   Resp 16   Ht 5\' 5"  (1.651 m)   Wt 255 lb (115.7 kg)   LMP 01/26/2017 Comment: age 68  SpO2 95%   BMI 42.43 kg/m    Physical Exam Vitals signs and nursing note reviewed.  Constitutional:      General: She is not in acute distress.    Appearance: She is well-developed. She is obese. She is not diaphoretic.  HENT:     Head: Normocephalic and atraumatic.     Mouth/Throat:     Pharynx: No oropharyngeal exudate.  Eyes:     Conjunctiva/sclera: Conjunctivae normal.     Pupils: Pupils are equal, round, and reactive to light.  Neck:     Musculoskeletal: Normal range of motion and neck supple.     Thyroid: No thyromegaly.     Vascular: No JVD.     Trachea: No tracheal deviation.  Cardiovascular:     Rate and Rhythm: Normal rate. Rhythm irregular.     Heart sounds: Normal heart sounds. No murmur. No friction rub. No gallop.      Comments: ECG showing ventricular bigeminy, left atrial enlargement, and non-specific t-abnormality.  Pulmonary:     Effort: Pulmonary effort is normal. No respiratory distress.     Breath sounds: Normal breath sounds. No wheezing or rales.  Chest:     Chest wall: No tenderness.  Abdominal:     General: Bowel sounds are normal.     Palpations: Abdomen is soft.     Tenderness: There is abdominal tenderness.  Musculoskeletal: Normal range of motion.        General: Tenderness and deformity present.      Comments: Right knee.   Lymphadenopathy:     Cervical: No cervical adenopathy.  Skin:    General: Skin is warm and dry.  Neurological:     Mental Status: She is alert and oriented to person, place, and time.     Cranial Nerves: No cranial nerve deficit.  Psychiatric:        Behavior: Behavior normal.        Thought Content: Thought content normal.        Judgment: Judgment normal.   Assessment/Plan: 1. Chest pain, unspecified type - EKG 12-Lead - showing ventricular bigeminy with left atrial enlargement. Non-specific t-abnormality. Does see cardiology and symptoms began after last routine follow up with him. Will get urgent appointment with cardiology for further evaluation and treatment.  - Ambulatory referral to Cardiology  2. Essential hypertension Increase coreg 3.125mg  tablets to two tablets twice daily. Continue other bp and antiarrhythmic medications as prescribed . - carvedilol (COREG) 3.125 MG tablet; Take 2 tablets (6.25 mg total) by mouth 2 (two) times daily with a meal.  Dispense: 120 tablet; Refill: 3 - hydrALAZINE (APRESOLINE) 10 MG tablet; Take 1 tablet (10 mg total) by mouth 3 (three) times daily.  Dispense: 270 tablet; Refill: 1  3. Generalized abdominal pain Normal HIDA scan - refer to gi for further evaluation and treatment.  - Ambulatory referral to Gastroenterology   General Counseling: alaisa moffitt understanding of the findings of todays visit and agrees with plan of treatment. I have discussed any further diagnostic evaluation that may be needed or ordered today. We also reviewed her medications today. she has been encouraged to call the office with any questions or concerns that should arise related to todays visit.  Cardiac risk factor modification:  1. Control blood pressure. 2. Exercise as prescribed. 3. Follow low sodium, low fat diet. and low fat and low cholestrol diet. 4. Take ASA 81mg  once a day. 5. Restricted calories diet to lose  weight.  This patient was  seen by Leretha Pol FNP Collaboration with Dr Lavera Guise as a part of collaborative care agreement  Orders Placed This Encounter  Procedures  . Ambulatory referral to Gastroenterology  . Ambulatory referral to Cardiology  . EKG 12-Lead    Meds ordered this encounter  Medications  . carvedilol (COREG) 3.125 MG tablet    Sig: Take 2 tablets (6.25 mg total) by mouth 2 (two) times daily with a meal.    Dispense:  120 tablet    Refill:  3    Please note, increasing dose until she sees cardiologist.    Order Specific Question:   Supervising Provider    Answer:   Lavera Guise [[id number]]  . hydrALAZINE (APRESOLINE) 10 MG tablet    Sig: Take 1 tablet (10 mg total) by mouth 3 (three) times daily.    Dispense:  270 tablet    Refill:  1    Order Specific Question:   Supervising Provider    Answer:   Lavera Guise [[id number]]    Time spent: 63 Minutes      Dr Lavera Guise Internal medicine

## 2018-10-25 NOTE — Progress Notes (Signed)
Pt blood pressure elevated informed provider, taken twice 1st reading machine 172/95 2nd reading manually 158/84

## 2018-10-27 ENCOUNTER — Ambulatory Visit: Payer: Medicare Other

## 2018-10-27 ENCOUNTER — Telehealth: Payer: Medicare Other | Admitting: Gastroenterology

## 2018-10-28 DIAGNOSIS — I493 Ventricular premature depolarization: Secondary | ICD-10-CM | POA: Diagnosis not present

## 2018-10-28 DIAGNOSIS — G4733 Obstructive sleep apnea (adult) (pediatric): Secondary | ICD-10-CM | POA: Diagnosis not present

## 2018-10-28 DIAGNOSIS — R29898 Other symptoms and signs involving the musculoskeletal system: Secondary | ICD-10-CM | POA: Diagnosis not present

## 2018-10-28 DIAGNOSIS — I10 Essential (primary) hypertension: Secondary | ICD-10-CM | POA: Diagnosis not present

## 2018-10-28 DIAGNOSIS — R079 Chest pain, unspecified: Secondary | ICD-10-CM | POA: Diagnosis not present

## 2018-11-01 ENCOUNTER — Telehealth: Payer: Medicare Other | Admitting: Gastroenterology

## 2018-11-02 ENCOUNTER — Ambulatory Visit (INDEPENDENT_AMBULATORY_CARE_PROVIDER_SITE_OTHER): Payer: Medicare Other | Admitting: Gastroenterology

## 2018-11-02 ENCOUNTER — Telehealth: Payer: Medicare Other | Admitting: Gastroenterology

## 2018-11-02 DIAGNOSIS — M47816 Spondylosis without myelopathy or radiculopathy, lumbar region: Secondary | ICD-10-CM | POA: Diagnosis not present

## 2018-11-02 DIAGNOSIS — R700 Elevated erythrocyte sedimentation rate: Secondary | ICD-10-CM | POA: Diagnosis not present

## 2018-11-02 DIAGNOSIS — R1013 Epigastric pain: Secondary | ICD-10-CM

## 2018-11-02 DIAGNOSIS — M47812 Spondylosis without myelopathy or radiculopathy, cervical region: Secondary | ICD-10-CM | POA: Diagnosis not present

## 2018-11-02 MED ORDER — FAMOTIDINE 40 MG PO TABS: 40.0000 mg | ORAL_TABLET | Freq: Every day | ORAL | 1 refills | Status: DC

## 2018-11-02 MED ORDER — DICYCLOMINE HCL 10 MG PO CAPS: 10.0000 mg | ORAL_CAPSULE | Freq: Three times a day (TID) | ORAL | 2 refills | Status: DC

## 2018-11-02 NOTE — Progress Notes (Signed)
Whitney [name]  [address]  [place]  [place], [place] [address]  Main: [phone]  Fax: [phone]   Gastroenterology Consultation  Referring Provider:     Ronnell Freshwater, NP Primary Care Physician:  Ronnell Freshwater, NP Reason for Consultation:     Abdominal pain         HPI:   Virtual Visit via Telephone Note  I connected with patient on 11/02/18 at 11:00 AM EDT by telephone and verified that I am speaking with the correct person using two identifiers.   I discussed the limitations, risks, security and privacy concerns of performing an evaluation and management service by telephone and the availability of in person appointments. I also discussed with the patient that there may be a patient responsible charge related to this service. The patient expressed understanding and agreed to proceed.  Location of the patient: Home Location of provider: Home Participating persons: Patient and provider only   History of Present Illness: Chief Complaint  Patient presents with  . Abdominal Pain     Whitney [name] is a 68 y.o. y/o female referred for consultation & management  by Dr. Ronnell Freshwater, NP.    She has been referred for abdominal pain.  Looking back on epic I can see that she has had an abdominal CT scan in March 2019 also for abdominal distention nausea dizziness bradycardia.  There was a small low-attenuation area within the body of the pancreas.  Lobular right kidney with questionable low-attenuation area in the lower pole.  Small uterine fibroids.  Moderate abdominal aortic atherosclerosis and diffuse coronary artery calcifications.  Multiple colonic diverticula at the recto sigmoid and descending colon. She also had a MRI of the abdomen in May 2019 which demonstrated benign liver abnormalities including multiple hemangiomas and cysts.  Gallstone.'s multiple small cystic foci throughout the pancreas.  Findings may represent small pseudocysts or areas of  sidebranch duct ectasia.  Cystic neoplasm of the pancreas not excluded follow-up imaging in 24 months recommended. 10/19/2018 HIDA scan: Normal.   Abdominal pain: Onset: 2.5 years associated with passing out on 1 occasion .  Pain no better after a meal Site :above belly button  Radiation: localized , each episode lasts 5-10 minutes , gets around 3 episodes a week .  Severity :severe  Petra Kuba of pain: dull pain  Aggravating factors: unclear- not related to meals. Everytime she gets the pain starts sweating  Relieving factors :when she lays down ,  Weight loss: has lost some weight with dieting does not think the pain has made her lose weight. NSAID use: no - she used to take Motrin - stopped  PPI use :no  Gall bladder surgery: intact Frequency of bowel movements: almost everyday - very regular  Change in bowel movements: no  Relief with bowel movements: unsure  Gas/Bloating/Abdominal distension: no      Past Medical History:  Diagnosis Date  . Abnormal Pap smear of cervix    Pt states she had colposcopy   . Arthritis   . Chronic kidney disease   . Hx of unilateral nephrectomy 1979   Left Nephrectomy  . Hypertension   . PMB (postmenopausal bleeding)   . Restless leg syndrome   . Vitamin D deficiency     Past Surgical History:  Procedure Laterality Date  . HYSTEROSCOPY W/D&C N/A 02/16/2017   Procedure: DILATATION AND CURETTAGE /HYSTEROSCOPY;  Surgeon: Defrancesco, Alanda Slim, MD;  Location: [facility];  Service: Gynecology;  Laterality:  N/A;  . KIDNEY SURGERY Left 1979   left kidney removed  . LEFT HEART CATH AND CORONARY ANGIOGRAPHY N/A 10/21/2017   Procedure: LEFT HEART CATH AND CORONARY ANGIOGRAPHY;  Surgeon: Teodoro Spray, MD;  Location: [facility];  Service: Cardiovascular;  Laterality: N/A;  . TOTAL KNEE ARTHROPLASTY Left 07/06/2017   Procedure: TOTAL KNEE ARTHROPLASTY;  Surgeon: Lovell Sheehan, MD;  Location: [facility];  Service: Orthopedics;  Laterality: Left;   . WRIST ARTHROSCOPY Left 1970s    Prior to Admission medications   Medication Sig Start Date End Date Taking? Authorizing Provider  acetaminophen (TYLENOL) 325 MG tablet Take 650 mg by mouth 4 (four) times daily as needed.    Yes [provider]  atorvastatin (LIPITOR) 40 MG tablet Take 1 tablet (40 mg total) by mouth daily at 6 PM. 09/10/18  Yes Boscia, Greer Ee, NP  carvedilol (COREG) 3.125 MG tablet Take 2 tablets (6.25 mg total) by mouth 2 (two) times daily with a meal. 10/25/18  Yes Boscia, Heather E, NP  diclofenac sodium (VOLTAREN) 1 % GEL  10/12/17  Yes [provider]  flecainide (TAMBOCOR) 150 MG tablet Take 150 mg by mouth 2 (two) times daily.   Yes Teodoro Spray, MD  gabapentin (NEURONTIN) 100 MG capsule Take 1 capsule (100 mg total) by mouth 2 (two) times daily. 10/22/17  Yes Wieting, Richard, MD  hydrALAZINE (APRESOLINE) 10 MG tablet Take 1 tablet (10 mg total) by mouth 3 (three) times daily. 10/25/18  Yes Ronnell Freshwater, NP  nystatin-triamcinolone ointment (MYCOLOG) Apply 1 application topically 2 (two) times daily. 12/16/17  Yes Defrancesco, Alanda Slim, MD  rOPINIRole (REQUIP) 0.5 MG tablet Takes 1 or 2 tablets daily as needed 01/11/18  Yes Boscia, Heather E, NP  traMADol (ULTRAM) 50 MG tablet Take 1 tablet (50 mg total) by mouth every 6 (six) hours as needed. 09/10/18  Yes Ronnell Freshwater, NP  traZODone (DESYREL) 50 MG tablet Take 1 tablet (50 mg total) by mouth at bedtime as needed for sleep. 09/10/18  Yes Boscia, Heather E, NP  amLODipine (NORVASC) 5 MG tablet amlodipine 5 mg tablet    [provider]    Family History  Problem Relation Age of Onset  . Ovarian cancer Mother   . Hypertension Father   . Diabetes Father   . Cancer Father   . Breast cancer Sister   . Diabetes Sister      Social History   Tobacco Use  . Smoking status: Never Smoker  . Smokeless tobacco: Never Used  Substance Use Topics  . Alcohol use: Yes    Comment:  occasionally  . Drug use: Yes    Frequency: 3.0 times per week    Types: Marijuana    Comment: Pt states she uses for leg pain: marijuana; states no longer uses cocaine    Allergies as of 11/02/2018 - Review Complete 11/01/2018  Allergen Reaction Noted  . Enalapril Swelling   . Lisinopril Swelling 05/26/2016    Review of Systems:    All systems reviewed and negative except where noted in HPI.   Observations/Objective:  Labs: CBC    Component Value Date/Time   WBC 5.8 09/27/2018 1036   WBC 6.1 11/29/2017 1555   RBC 4.66 09/27/2018 1036   RBC 4.54 11/29/2017 1555   HGB 13.4 09/27/2018 1036   HCT 40.7 09/27/2018 1036   PLT 264 09/27/2018 1036   MCV 87 09/27/2018 1036   MCH 28.8 09/27/2018 1036  MCH 29.1 11/29/2017 1555   MCHC 32.9 09/27/2018 1036   MCHC 33.2 11/29/2017 1555   RDW 12.6 09/27/2018 1036   LYMPHSABS 1.7 02/12/2017 1508   MONOABS 0.6 02/12/2017 1508   EOSABS 0.1 02/12/2017 1508   BASOSABS 0.0 02/12/2017 1508   CMP     Component Value Date/Time   NA 144 09/27/2018 1036   K 4.9 09/27/2018 1036   CL 106 09/27/2018 1036   CO2 23 09/27/2018 1036   GLUCOSE 88 09/27/2018 1036   GLUCOSE 143 (H) 11/29/2017 1555   BUN 25 09/27/2018 1036   CREATININE 1.20 (H) 09/27/2018 1036   CALCIUM 8.6 (L) 09/27/2018 1036   PROT 6.5 09/27/2018 1036   ALBUMIN 3.5 (L) 09/27/2018 1036   AST 14 09/27/2018 1036   ALT 12 09/27/2018 1036   ALKPHOS 102 09/27/2018 1036   BILITOT 0.3 09/27/2018 1036   GFRNONAA 47 (L) 09/27/2018 1036   GFRAA 54 (L) 09/27/2018 1036    Imaging Studies: Mr Cervical Spine Wo Contrast  Result Date: 10/13/2018 CLINICAL DATA:  Initial evaluation for left-sided neck pain with extension into the left upper extremity, with associated numbness and tingling. EXAM: MRI CERVICAL SPINE WITHOUT CONTRAST TECHNIQUE: Multiplanar, multisequence MR imaging of the cervical spine was performed. No intravenous contrast was administered. COMPARISON:  None available.  FINDINGS: Alignment: Straightening of the normal cervical lordosis. Trace retrolisthesis of C3 on C4, with trace anterolisthesis of C7 on T1, likely chronic and facet mediated. Vertebrae: Vertebral body heights maintained without evidence for acute or chronic fracture. Bone marrow signal intensity within normal limits. No discrete or worrisome osseous lesions. No abnormal marrow edema. Cord: Signal intensity within the cervical spinal cord is normal. Posterior Fossa, vertebral arteries, paraspinal tissues: Visualized brain and posterior fossa within normal limits. Craniocervical junction normal. Paraspinous and prevertebral soft tissues normal. Normal intravascular flow voids seen within the vertebral arteries bilaterally. Disc levels: C2-C3: Left eccentric disc bulge with bilateral uncovertebral hypertrophy. Left-sided facet hypertrophy. Flattening of the ventral thecal sac with resultant mild spinal stenosis. No cord deformity. Mild left C3 foraminal stenosis. C3-C4: Chronic circumferential disc osteophyte with intervertebral disc space narrowing. Broad posterior component flattens and effaces the ventral thecal sac with resultant moderate spinal stenosis. No significant cord deformity. Severe bilateral C4 foraminal stenosis, left slightly worse than right. C4-C5: Chronic circumferential disc osteophyte complex with intervertebral disc space narrowing. Flattening and effacement of the ventral thecal sac with resultant moderate spinal stenosis. Mild cord flattening without cord signal changes. Severe bilateral C5 foraminal stenosis. C5-C6: Circumferential disc osteophyte with intervertebral disc space narrowing. Right greater than left uncovertebral spurring. Flattening of the ventral CSF with resultant moderate spinal stenosis. No cord deformity. Severe right with moderate left C6 foraminal narrowing. C6-C7: Diffuse disc bulge with bilateral uncovertebral spurring, worse on the right. Flattening of the ventral  thecal sac with resultant mild spinal stenosis. No cord deformity. Fairly severe bilateral C7 foraminal stenosis, left worse than right. C7-T1: Trace anterolisthesis. Mild diffuse disc bulge with left greater than right uncovertebral spurring. Bilateral facet hypertrophy, also worse on the left. No significant spinal stenosis. Moderate left C8 foraminal narrowing. Visualized upper thoracic spine demonstrates no significant finding. IMPRESSION: 1. Moderate multilevel cervical spondylolysis with resultant mild to moderate diffuse spinal stenosis at C2-3 through C6-7, most pronounced at C3-4 and C4-5. 2. Multifactorial degenerative changes with resultant moderate to severe bilateral C4 through C7 foraminal stenosis with moderate left C8 foraminal narrowing. Electronically Signed   By: Pincus Badder.D.  On: 10/13/2018 16:13   Nm Hepatobiliary Including Gb  Result Date: 10/19/2018 CLINICAL DATA:  Right upper quadrant pain and nausea EXAM: NUCLEAR MEDICINE HEPATOBILIARY IMAGING TECHNIQUE: Sequential images of the abdomen were obtained out to 60 minutes following intravenous administration of radiopharmaceutical. RADIOPHARMACEUTICALS:  5.52 mCi Tc-52m  Choletec IV COMPARISON:  None. FINDINGS: Liver uptake of radiotracer is unremarkable. There is prompt visualization of gallbladder and small bowel, indicating patency of the cystic and common bile ducts. IMPRESSION: Study within normal limits. Electronically Signed   By: Lowella Grip III M.D.   On: 10/19/2018 12:38   Ct No Charge  Result Date: 10/06/2018 CLINICAL DATA:  Right knee arthritis EXAM: CT ADDITIONAL VIEWS AT NO CHARGE TECHNIQUE: Multidetector CT imaging of the right knee was performed according to the standard protocol. Multiplanar CT image reconstructions were also generated. COMPARISON:  None. FINDINGS: Bones/Joint/Cartilage No fracture or dislocation. Normal alignment. No joint effusion. Right hip joint space is maintained. Severe medial  femorotibial compartment joint space narrowing with a near bone-on-bone appearance, subchondral sclerosis and marginal osteophytes. Mild subchondral cystic changes in the medial femorotibial compartment. Moderate lateral femorotibial compartment with marginal osteophytes. Severe patellofemoral compartment joint space narrowing with marginal osteophytes. Small joint effusion. Right ankle demonstrates no focal abnormality. Ligaments Ligaments are suboptimally evaluated by CT. Muscles and Tendons Muscles are normal. 6.4 x 3 cm simple lipoma deep to the rectus femoris muscle. Soft tissue No fluid collection or hematoma.  No soft tissue mass. IMPRESSION: 1. Tricompartmental osteoarthritis of the right knee. Electronically Signed   By: Kathreen Devoid   On: 10/06/2018 13:44    Assessment and Plan:   Whitney [name] is a 68 y.o. y/o female has been referred for abdominal pain.  Appears to be longstanding.  Previous abdominal imaging showed abnormalities of the pancreas and per radiology report recommended repeating scan in 2 years.Her symptoms presently could be from functional dyspepsia. She does give some history of passing out with the pain ?vagal .   Plan :   1.  I will refer this patient to obtain an EUS in a few weeks after the pandemic to evaluate the cystic lesions of the pancreas. 2. Stool H pylori antigen 3. Pepcid daily script to be sent 4. Bentyl PRN upto TID 10 mg  5. F/u webex in 2-4 weeks 6. EGD+/- colonoscopy when pandemic is better controlled/    Follow Up Instructions:   I discussed the assessment and treatment plan with the patient. The patient was provided an opportunity to ask questions and all were answered. The patient agreed with the plan and demonstrated an understanding of the instructions.   The patient was advised to call back or seek an in-person evaluation if the symptoms worsen or if the condition fails to improve as anticipated.  I provided 30 minutes of non-face-to-face  time during this encounter.   Dr Whitney Bellows MD,MRCP [facility]) Gastroenterology/Hepatology Pager: [phone]   Speech recognition software was used to dictate the above note.

## 2018-11-02 NOTE — Addendum Note (Signed)
Addended by: Dorethea Clan on: 11/02/2018 11:24 AM   Modules accepted: Orders

## 2018-11-03 ENCOUNTER — Inpatient Hospital Stay: Admission: RE | Admit: 2018-11-03 | Payer: Medicare Other | Source: Ambulatory Visit

## 2018-11-08 DIAGNOSIS — R079 Chest pain, unspecified: Secondary | ICD-10-CM | POA: Diagnosis not present

## 2018-11-15 DIAGNOSIS — I10 Essential (primary) hypertension: Secondary | ICD-10-CM | POA: Diagnosis not present

## 2018-11-16 ENCOUNTER — Ambulatory Visit (INDEPENDENT_AMBULATORY_CARE_PROVIDER_SITE_OTHER): Payer: Medicare Other | Admitting: Gastroenterology

## 2018-11-16 DIAGNOSIS — R1013 Epigastric pain: Secondary | ICD-10-CM

## 2018-11-16 NOTE — Progress Notes (Signed)
Whitney [name] , MD [address]  [place]  [place], [place] [address]  Main: [phone]  Fax: [phone]   Primary Care Physician: Ronnell Freshwater, NP  Virtual Visit via Video Note  I connected with patient on 11/16/18 at  8:30 AM EDT by video and verified that I am speaking with the correct person using two identifiers.   I discussed the limitations, risks, security and privacy concerns of performing an evaluation and management service by video  and the availability of in person appointments. I also discussed with the patient that there may be a patient responsible charge related to this service. The patient expressed understanding and agreed to proceed.  Location of Patient: Home Location of Provider: Home Persons involved: Patient and provider only   History of Present Illness: Chief Complaint  Patient presents with   Follow-up    Dyspepsia    HPI: Whitney [name] is a 68 y.o. female   Summary of history :  Initially seen and referred on 11/02/2018 for abdominal pain.Abdominal CT scan in March 2019 also for abdominal distention nausea dizziness bradycardia.  There was a small low-attenuation area within the body of the pancreas.  Lobular right kidney with questionable low-attenuation area in the lower pole.  Small uterine fibroids.  Moderate abdominal aortic atherosclerosis and diffuse coronary artery calcifications.  Multiple colonic diverticula at the recto sigmoid and descending colon. MRI of the abdomen in May 2019 which demonstrated benign liver abnormalities including multiple hemangiomas and cysts.  Gallstone.'s multiple small cystic foci throughout the pancreas.  Findings may represent small pseudocysts or areas of sidebranch duct ectasia.  Cystic neoplasm of the pancreas not excluded follow-up imaging in 24 months recommended. 10/19/2018 HIDA scan: Normal.   Abdominal pain: Onset: 2.5 years associated with passing out on 1 occasion .  Pain no better  after a meal Site :above belly button  Radiation: localized , each episode lasts 5-10 minutes , gets around 3 episodes a week .  Severity :severe  Petra Kuba of pain: dull pain  Aggravating factors: unclear- not related to meals. Everytime she gets the pain starts sweating  Relieving factors :when she lays down ,  Weight loss: has lost some weight with dieting does not think the pain has made her lose weight. NSAID use: no - she used to take Motrin - stopped  PPI use :no  Gall bladder surgery: intact Frequency of bowel movements: almost everyday - very regular  Change in bowel movements: no  Relief with bowel movements: unsure  Gas/Bloating/Abdominal distension: no    Interval history   11/02/2018- 11/16/2018  Since last 2 weeks she feels better, Says the frequency is lower .HIDA scan was normal. Overall 60% better.    Current Outpatient Medications  Medication Sig Dispense Refill   acetaminophen (TYLENOL) 325 MG tablet Take 650 mg by mouth 4 (four) times daily as needed.      amLODipine (NORVASC) 5 MG tablet amlodipine 5 mg tablet     atorvastatin (LIPITOR) 40 MG tablet Take 1 tablet (40 mg total) by mouth daily at 6 PM. 90 tablet 1   carvedilol (COREG) 3.125 MG tablet Take 2 tablets (6.25 mg total) by mouth 2 (two) times daily with a meal. 120 tablet 3   diclofenac sodium (VOLTAREN) 1 % GEL   0   dicyclomine (BENTYL) 10 MG capsule Take 1 capsule (10 mg total) by mouth 4 (four) times daily -  before meals and at bedtime. As needed 120  capsule 2   famotidine (PEPCID) 40 MG tablet Take 1 tablet (40 mg total) by mouth daily. 90 tablet 1   flecainide (TAMBOCOR) 150 MG tablet Take 150 mg by mouth 2 (two) times daily.     gabapentin (NEURONTIN) 100 MG capsule Take 1 capsule (100 mg total) by mouth 2 (two) times daily. 90 capsule 0   hydrALAZINE (APRESOLINE) 10 MG tablet Take 1 tablet (10 mg total) by mouth 3 (three) times daily. 270 tablet 1   nystatin-triamcinolone ointment  (MYCOLOG) Apply 1 application topically 2 (two) times daily. 30 g 0   rOPINIRole (REQUIP) 0.5 MG tablet Takes 1 or 2 tablets daily as needed 90 tablet 1   traMADol (ULTRAM) 50 MG tablet Take 1 tablet (50 mg total) by mouth every 6 (six) hours as needed. 20 tablet 0   traZODone (DESYREL) 50 MG tablet Take 1 tablet (50 mg total) by mouth at bedtime as needed for sleep. 30 tablet 3   No current facility-administered medications for this visit.     Allergies as of 11/16/2018 - Review Complete 11/15/2018  Allergen Reaction Noted   Enalapril Swelling    Lisinopril Swelling 05/26/2016    Review of Systems:    All systems reviewed and negative except where noted in HPI.  General Appearance:    Alert, cooperative, no distress, appears stated age  Head:    Normocephalic, without obvious abnormality, atraumatic  Eyes:    PERRL, conjunctiva/corneas clear,  Ears:    Grossly normal hearing    Neurologic:  Grossly normal    Observations/Objective:  Labs: CMP     Component Value Date/Time   NA 144 09/27/2018 1036   K 4.9 09/27/2018 1036   CL 106 09/27/2018 1036   CO2 23 09/27/2018 1036   GLUCOSE 88 09/27/2018 1036   GLUCOSE 143 (H) 11/29/2017 1555   BUN 25 09/27/2018 1036   CREATININE 1.20 (H) 09/27/2018 1036   CALCIUM 8.6 (L) 09/27/2018 1036   PROT 6.5 09/27/2018 1036   ALBUMIN 3.5 (L) 09/27/2018 1036   AST 14 09/27/2018 1036   ALT 12 09/27/2018 1036   ALKPHOS 102 09/27/2018 1036   BILITOT 0.3 09/27/2018 1036   GFRNONAA 47 (L) 09/27/2018 1036   GFRAA 54 (L) 09/27/2018 1036   Lab Results  Component Value Date   WBC 5.8 09/27/2018   HGB 13.4 09/27/2018   HCT 40.7 09/27/2018   MCV 87 09/27/2018   PLT 264 09/27/2018    Imaging Studies: Nm Hepatobiliary Including Gb  Result Date: 10/19/2018 CLINICAL DATA:  Right upper quadrant pain and nausea EXAM: NUCLEAR MEDICINE HEPATOBILIARY IMAGING TECHNIQUE: Sequential images of the abdomen were obtained out to 60 minutes following  intravenous administration of radiopharmaceutical. RADIOPHARMACEUTICALS:  5.52 mCi Tc-77m  Choletec IV COMPARISON:  None. FINDINGS: Liver uptake of radiotracer is unremarkable. There is prompt visualization of gallbladder and small bowel, indicating patency of the cystic and common bile ducts. IMPRESSION: Study within normal limits. Electronically Signed   By: Lowella Grip III M.D.   On: 10/19/2018 12:38    Assessment and Plan:   MADORA BARLETTA is a 68 y.o. y/o female here to follow up for abdominal pain.  Appears to be longstanding.  Previous abdominal imaging showed abnormalities of the pancreas and per radiology report recommended repeating scan in 2 years. HIDA normal .Her symptoms presently could be from functional dyspepsia. She does give some history of passing out with the pain ?vagal . Overall feeling better on PPI and bentyl  since 2 weeks after commencing   Plan :   1.  I will refer this patient to obtain an EUS in a few weeks after the pandemic to evaluate the cystic lesions of the pancreas. 2. Stool H pylori antigen 3. Pepcid daily script to be sent 4. Bentyl PRN upto TID 10 mg  5. F/u webex in 2-4 weeks 6. EGD+/- colonoscopy when pandemic is better controlled.   F/u in 8 weeks    I discussed the assessment and treatment plan with the patient. The patient was provided an opportunity to ask questions and all were answered. The patient agreed with the plan and demonstrated an understanding of the instructions.   The patient was advised to call back or seek an in-person evaluation if the symptoms worsen or if the condition fails to improve as anticipated.   Dr Whitney Bellows MD,MRCP [facility]) Gastroenterology/Hepatology Pager: [phone]   Speech recognition software was used to dictate this note.

## 2018-11-22 ENCOUNTER — Encounter: Payer: Self-pay | Admitting: Nurse Practitioner

## 2018-11-22 ENCOUNTER — Ambulatory Visit (INDEPENDENT_AMBULATORY_CARE_PROVIDER_SITE_OTHER): Payer: Medicare Other | Admitting: Nurse Practitioner

## 2018-11-22 ENCOUNTER — Other Ambulatory Visit: Payer: Self-pay

## 2018-11-22 VITALS — Resp 16 | Ht 65.0 in | Wt 255.0 lb

## 2018-11-22 DIAGNOSIS — R079 Chest pain, unspecified: Secondary | ICD-10-CM | POA: Diagnosis not present

## 2018-11-22 DIAGNOSIS — R1084 Generalized abdominal pain: Secondary | ICD-10-CM

## 2018-11-22 DIAGNOSIS — I10 Essential (primary) hypertension: Secondary | ICD-10-CM

## 2018-11-22 NOTE — Progress Notes (Signed)
[facility] [place], [place] [address]  Internal MEDICINE  Telephone Visit  Patient Name: Whitney [name]  [id number]  [id number]  Date of Service: 12/05/2018  I connected with the patient at 12:29pm by webcam and verified the patients identity using two identifiers.   I discussed the limitations, risks, security and privacy concerns of performing an evaluation and management service by webcam and the availability of in person appointments. I also discussed with the patient that there may be a patient responsible charge related to the service.  The patient expressed understanding and agrees to proceed.    Chief Complaint  Patient presents with  . Telephone Assessment    Pt went to the cardiology her BP140/88  . Telephone Screen  . Hypertension    Follow up BP   . Quality Metric Gaps    colonoscopy,BMD and pneumonia     The patient has been contacted via webcam for follow up visit due to concerns for spread of novel coronavirus. The patient continues to have chest tightness and palpitations. She has seen her cardiologist since she was last seen. Is undergoing some testing to evaluate the electrical function of the heart. Is seeing GI provider due to persistent belly pain. She does have gall stones, however, her gallbladder is functioning normally based on recent HIDA scan results.        Current Medication: Outpatient Encounter Medications as of 11/22/2018  Medication Sig  . acetaminophen (TYLENOL) 325 MG tablet Take 650 mg by mouth 4 (four) times daily as needed.   Marland Kitchen amLODipine (NORVASC) 5 MG tablet amlodipine 5 mg tablet  . atorvastatin (LIPITOR) 40 MG tablet Take 1 tablet (40 mg total) by mouth daily at 6 PM.  . carvedilol (COREG) 3.125 MG tablet Take 2 tablets (6.25 mg total) by mouth 2 (two) times daily with a meal.  . diclofenac sodium (VOLTAREN) 1 % GEL   . dicyclomine (BENTYL) 10 MG capsule Take 1 capsule (10 mg total) by mouth 4 (four) times daily  -  before meals and at bedtime. As needed  . famotidine (PEPCID) 40 MG tablet Take 1 tablet (40 mg total) by mouth daily.  . flecainide (TAMBOCOR) 150 MG tablet Take 150 mg by mouth 2 (two) times daily.  Marland Kitchen gabapentin (NEURONTIN) 100 MG capsule Take 1 capsule (100 mg total) by mouth 2 (two) times daily.  . hydrALAZINE (APRESOLINE) 10 MG tablet Take 1 tablet (10 mg total) by mouth 3 (three) times daily.  Marland Kitchen nystatin-triamcinolone ointment (MYCOLOG) Apply 1 application topically 2 (two) times daily.  Marland Kitchen rOPINIRole (REQUIP) 0.5 MG tablet Takes 1 or 2 tablets daily as needed  . traMADol (ULTRAM) 50 MG tablet Take 1 tablet (50 mg total) by mouth every 6 (six) hours as needed.  . traZODone (DESYREL) 50 MG tablet Take 1 tablet (50 mg total) by mouth at bedtime as needed for sleep.   No facility-administered encounter medications on file as of 11/22/2018.     Surgical History: Past Surgical History:  Procedure Laterality Date  . HYSTEROSCOPY W/D&C N/A 02/16/2017   Procedure: DILATATION AND CURETTAGE /HYSTEROSCOPY;  Surgeon: Defrancesco, Alanda Slim, MD;  Location: [facility] ORS;  Service: Gynecology;  Laterality: N/A;  . KIDNEY SURGERY Left 1979   left kidney removed  . LEFT HEART CATH AND CORONARY ANGIOGRAPHY N/A 10/21/2017   Procedure: LEFT HEART CATH AND CORONARY ANGIOGRAPHY;  Surgeon: Teodoro Spray, MD;  Location: [facility];  Service: Cardiovascular;  Laterality: N/A;  .  TOTAL KNEE ARTHROPLASTY Left 07/06/2017   Procedure: TOTAL KNEE ARTHROPLASTY;  Surgeon: Lovell Sheehan, MD;  Location: [facility] ORS;  Service: Orthopedics;  Laterality: Left;  . WRIST ARTHROSCOPY Left 1970s    Medical History: Past Medical History:  Diagnosis Date  . Abnormal Pap smear of cervix    Pt states she had colposcopy   . Arthritis   . Chronic kidney disease   . Hx of unilateral nephrectomy 1979   Left Nephrectomy  . Hypertension   . PMB (postmenopausal bleeding)   . Restless leg syndrome   . Vitamin D  deficiency     Family History: Family History  Problem Relation Age of Onset  . Ovarian cancer Mother   . Hypertension Father   . Diabetes Father   . Cancer Father   . Breast cancer Sister   . Diabetes Sister     Social History   Socioeconomic History  . Marital status: Divorced    Spouse name: Not on file  . Number of children: Not on file  . Years of education: Not on file  . Highest education level: Not on file  Occupational History  . Not on file  Social Needs  . Financial resource strain: Not on file  . Food insecurity:    Worry: Not on file    Inability: Not on file  . Transportation needs:    Medical: Not on file    Non-medical: Not on file  Tobacco Use  . Smoking status: Never Smoker  . Smokeless tobacco: Never Used  Substance and Sexual Activity  . Alcohol use: Yes    Comment: occasionally  . Drug use: Yes    Frequency: 3.0 times per week    Types: Marijuana    Comment: Pt states she uses for leg pain: marijuana; states no longer uses cocaine  . Sexual activity: Yes    Birth control/protection: None  Lifestyle  . Physical activity:    Days per week: Not on file    Minutes per session: Not on file  . Stress: Not on file  Relationships  . Social connections:    Talks on phone: Not on file    Gets together: Not on file    Attends religious service: Not on file    Active member of club or organization: Not on file    Attends meetings of clubs or organizations: Not on file    Relationship status: Not on file  . Intimate partner violence:    Fear of current or ex partner: Not on file    Emotionally abused: Not on file    Physically abused: Not on file    Forced sexual activity: Not on file  Other Topics Concern  . Not on file  Social History Narrative  . Not on file      Review of Systems  Constitutional: Positive for activity change and fatigue. Negative for chills, diaphoresis and unexpected weight change.  HENT: Negative for congestion,  postnasal drip, rhinorrhea, sneezing and sore throat.   Eyes: Negative for redness.  Respiratory: Negative for cough, chest tightness, shortness of breath and wheezing.   Cardiovascular: Positive for chest pain and palpitations.       Nlood pressure was improved when she saw cardiologist.   Gastrointestinal: Positive for abdominal pain, diarrhea and nausea. Negative for constipation and vomiting.  Endocrine: Negative for cold intolerance, heat intolerance, polydipsia and polyuria.  Musculoskeletal: Positive for arthralgias, back pain and myalgias. Negative for joint swelling and neck pain.  Skin: Negative for rash.  Allergic/Immunologic: Positive for environmental allergies.  Neurological: Positive for weakness. Negative for tremors and numbness.  Hematological: Negative for adenopathy. Does not bruise/bleed easily.  Psychiatric/Behavioral: Negative for behavioral problems (Depression), sleep disturbance and suicidal ideas. The patient is nervous/anxious.     Today's Vitals   11/22/18 1140  Resp: 16  Weight: 255 lb (115.7 kg)  Height: 5\' 5"  (1.651 m)   Body mass index is 42.43 kg/m.  Observation/Objective:  The patient is alert and oriented. She appears fatigued. Her breathing is non-labored and there is no wheezing or cough appreciated today. She does not appear to be in any acute distress.    Assessment/Plan: 1. Generalized abdominal pain Continue visits with GI provider and follow up with general surgeon as needed/as scheduled.   2. Essential hypertension Improved. Continue bp medication as prescribed.   3. Chest pain, unspecified type Continue regular visits with cardiology as scheduled.   General Counseling: Whitney [name] understanding of the findings of today's phone visit and agrees with plan of treatment. I have discussed any further diagnostic evaluation that may be needed or ordered today. We also reviewed her medications today. she has been encouraged to call the  office with any questions or concerns that should arise related to todays visit.  This patient was seen by Leretha Pol FNP Collaboration with Dr Lavera Guise as a part of collaborative care agreement   Time spent: 57 Minutes    Dr Lavera Guise Internal medicine

## 2018-12-07 NOTE — TOC Initial Note (Signed)
Transition of Care [facility]) - Initial/Assessment Note    Patient Details  Name: Whitney [name] MRN: [id number] Date of Birth: [birth year]  Transition of Care [facility]) CM/SW Contact:    Su Hilt, RN Phone Number: 12/07/2018, 9:55 AM  Clinical Narrative:                  Spoke with the patient to discuss the upcoming ortho surgery and discuss needs for  DC, She has had previous Knee replacement on the opposite knee in 2017 She has a cousin that lives with her and will be helping her after surgery She is aware of the upcoming appointment for surgery as 01/12/19 She was not aware of the appointment for preop for 12/14/18, I encouraged her to call the office to verify that appointment She stated that she has an old walker that does not have the wheels on the front, She would like a RW to be ordered and delivered to her home prior to surgery, she would also like a 3 in 1 to use at bedside as well as in the shower. Will work to get the order for the DME to have it delivered to her home prior to surgery She verified that her insurance is still [employer] and has a Geologist, engineering Her PCP is Dr. Junius Creamer She prefers to use [facility] in [place] She does not have an appointment scheduled for Out patient PT that she is aware of, I will verify the appointment listed for 01/24/19 for [facility] outpatient therapy She does not want to use Home Health services and prefers to go outpatient She uses [employer] transportation [place] and will continue.        Patient Goals and CMS Choice        Expected Discharge Plan and Services                                                Prior Living Arrangements/Services                       Activities of Daily Living      Permission Sought/Granted                  Emotional Assessment              Admission diagnosis:  RIGHT KNEE OSTEOARTHRITIS Patient Active Problem List   Diagnosis Date Noted  .  Primary osteoarthritis of right knee 09/15/2018  . Encounter for pre-operative examination 09/15/2018  . Obstructive sleep apnea 09/15/2018  . Calculus of gallbladder without cholecystitis without obstruction 09/15/2018  . Calculus of gallbladder with cholecystitis without biliary obstruction 01/27/2018  . Restless leg syndrome 01/27/2018  . Generalized abdominal pain 01/11/2018  . Cyst of pancreas 01/11/2018  . Other specified disorders of kidney and ureter 01/11/2018  . Primary insomnia 01/11/2018  . Paroxysmal atrial fibrillation ([place]) 11/12/2017  . Essential hypertension 10/25/2017  . Mixed hyperlipidemia 10/25/2017  . Low back pain at multiple sites 10/25/2017  . Dysuria 10/25/2017  . Encounter for general adult medical examination with abnormal findings 10/25/2017  . Vitamin D deficiency 10/25/2017  . Near syncope   . Symptomatic bradycardia 10/19/2017  . Carpal tunnel syndrome 10/12/2017  . Primary osteoarthritis of left knee 07/09/2017  . Total knee replacement status, left 07/06/2017  . Chest pain  06/29/2017  . Postop check 02/25/2017  . Impingement syndrome of shoulder region 01/20/2017  . Neck pain 01/20/2017  . Obesity (BMI 35.0-39.9 without comorbidity) 07/15/2016  . Endometrial polyp 07/15/2016  . Morbid obesity ([place]) 07/15/2016  . Family history of breast cancer in first degree relative 07/15/2016  . Family history of ovarian cancer 07/15/2016  . Knee pain 07/04/2016  . Bilateral leg pain 06/24/2016   PCP:  Ronnell Freshwater, NP Pharmacy:   [facility] [place], [place] - [address] [place] [address] Phone: [phone] Fax: [phone]  [id number] [address], [address] [place] [address] Phone: [phone] Fax: [phone]     Social Determinants of Health (SDOH) Interventions    Readmission Risk Interventions No  flowsheet data found.

## 2018-12-10 NOTE — TOC Progression Note (Signed)
Transition of Care [facility]) - Progression Note    Patient Details  Name: Whitney [name] MRN: [id number] Date of Birth: [birth year]  Transition of Care [facility]) CM/SW Contact  Su Hilt, RN Phone Number: 12/10/2018, 3:30 PM  Clinical Narrative:     Durene Cal email to [facility] with [facility] to please make appointment for PT for after surgery for the patient.       Expected Discharge Plan and Services                                                 Social Determinants of Health (SDOH) Interventions    Readmission Risk Interventions No flowsheet data found.

## 2018-12-13 NOTE — TOC Progression Note (Signed)
Transition of Care [facility]) - Progression Note    Patient Details  Name: BAYLEA MILBURN MRN: [id number] Date of Birth: [birth year]  Transition [facility] Care [facility]) CM/SW Contact  Marshell Garfinkel, RN Phone Number: 12/13/2018, 8:42 AM  Clinical Narrative:    TOC received email back from [facility] outpatient therapy department requesting Rx for outpatient therapy.  She does not have anything scheduled with them at this time.  She uses [employer] for transportation and will need to make these arrangements when appointment is made.  I have sent email to [employer] office manager with [facility] requesting Rx and post-op appointment for this patient.  Surgery scheduled for 01/12/19. Dr. Harlow Mares has prescribed rolling walker and bedside commode for this patient which has been ordered for home deliver with Brad with [employer].         Expected Discharge Plan and Services                                                 Social Determinants of Health (SDOH) Interventions    Readmission Risk Interventions No flowsheet data found.

## 2018-12-14 DIAGNOSIS — M1711 Unilateral primary osteoarthritis, right knee: Secondary | ICD-10-CM | POA: Diagnosis not present

## 2018-12-14 DIAGNOSIS — Z01818 Encounter for other preprocedural examination: Secondary | ICD-10-CM | POA: Diagnosis not present

## 2018-12-28 ENCOUNTER — Ambulatory Visit: Payer: Medicare Other | Attending: Orthopedic Surgery

## 2018-12-28 ENCOUNTER — Other Ambulatory Visit: Payer: Self-pay

## 2018-12-28 DIAGNOSIS — M6281 Muscle weakness (generalized): Secondary | ICD-10-CM | POA: Insufficient documentation

## 2018-12-28 DIAGNOSIS — M25552 Pain in left hip: Secondary | ICD-10-CM | POA: Diagnosis present

## 2018-12-28 DIAGNOSIS — M25561 Pain in right knee: Secondary | ICD-10-CM | POA: Diagnosis not present

## 2018-12-28 DIAGNOSIS — G8929 Other chronic pain: Secondary | ICD-10-CM

## 2018-12-29 NOTE — Therapy (Signed)
[place] [address], [place], [address] Phone: [phone]   Fax:  [phone]  Physical Therapy Evaluation  Patient Details  Name: Whitney [name] MRN: [id number] Date of Birth: 29-Aug-1950 Referring Provider (PT): Kurtis Bushman MD   Encounter Date: 12/28/2018  PT End of Session - 12/28/18 1456    Visit Number  1    Number of Visits  13    Date for PT Re-Evaluation  02/08/19    Authorization Time Period  1 / 10 G code    PT Start Time  1400    PT Stop Time  1500    PT Time Calculation (min)  60 min    Activity Tolerance  Patient tolerated treatment well    Behavior During Therapy  [facility] for tasks assessed/performed       Past Medical History:  Diagnosis Date  . Abnormal Pap smear of cervix    Pt states she had colposcopy   . Arthritis   . Chronic kidney disease   . Hx of unilateral nephrectomy 1979   Left Nephrectomy  . Hypertension   . PMB (postmenopausal bleeding)   . Restless leg syndrome   . Vitamin D deficiency     Past Surgical History:  Procedure Laterality Date  . HYSTEROSCOPY W/D&C N/A 02/16/2017   Procedure: DILATATION AND CURETTAGE /HYSTEROSCOPY;  Surgeon: Defrancesco, Alanda Slim, MD;  Location: [facility] ORS;  Service: Gynecology;  Laterality: N/A;  . KIDNEY SURGERY Left 1979   left kidney removed  . LEFT HEART CATH AND CORONARY ANGIOGRAPHY N/A 10/21/2017   Procedure: LEFT HEART CATH AND CORONARY ANGIOGRAPHY;  Surgeon: Teodoro Spray, MD;  Location: [place] [facility];  Service: Cardiovascular;  Laterality: N/A;  . TOTAL KNEE ARTHROPLASTY Left 07/06/2017   Procedure: TOTAL KNEE ARTHROPLASTY;  Surgeon: Lovell Sheehan, MD;  Location: [facility] ORS;  Service: Orthopedics;  Laterality: Left;  . WRIST ARTHROSCOPY Left 1970s    There were no vitals filed for this visit.   Subjective Assessment - 12/28/18 1416    Subjective  Patient is a 68 yo right hand dominant female presenting with  increased L hip/low back pain and R knee pain. Patient has referrals for both her R knee and L hip/back and prioritizing the R knee secondary to her upcoming surgery. Patient reports increased difficulty with walking for long periods of time and ascending and decesending the stairs. Patient reports she has been resting her knee for rest but reports otherwise nothing has helped with her pain. Patient reports surgery of a TKA on 6/10. Patient reports overal with ambulation and has been addressing LE weakness.    Pertinent History  s/p TKA 07/06/2017; right knee arthritis with weakness as well with reports of right and left leg "giving way" intermittently; Going to have TKA 6/10    Limitations  Sitting;Walking;Standing;House hold activities;Other (comment)    How long can you sit comfortably?  forever    How long can you stand comfortably?  10    How long can you walk comfortably?  25    Diagnostic tests  imaging - see scanned docs    Patient Stated Goals  Improve motion and strength of L hip and R knee    Currently in Pain?  Yes   L Hip: 10/10 Worst pain; R knee: 6/10   Pain Score  6    hip; knee 0/10   Pain Location  Knee    Pain Orientation  Left    Pain Descriptors / Indicators  Aching;Discomfort;Shooting;Tingling;Tightness    Pain Type  Chronic pain    Pain Radiating Towards  L hip to L knee    Pain Onset  More than a month ago    Pain Frequency  Intermittent         [facility] PT Assessment - 12/29/18 0001      Assessment   Medical Diagnosis  R knee pain L hip back pain    Referring Provider (PT)  Kurtis Bushman MD    Onset Date/Surgical Date  08/04/16    Hand Dominance  Right    Prior Therapy  Prior therapy for other knee      Precautions   Precautions  Fall      Restrictions   Weight Bearing Restrictions  No      Balance Screen   Has the patient fallen in the past 6 months  No    How many times?  0    Has the patient had a decrease in activity level because of a fear of  falling?   No    Is the patient reluctant to leave their home because of a fear of falling?   No      Home Environment   Living Environment  Private residence    Living Arrangements  Other relatives    Type of Home  Apartment    Home Access  Level entry    Home Layout  One level    [place] - 2 wheels;Tub bench;Toilet riser;Other (comment)      Prior Function   Level of Independence  Independent    Vocation  Retired    Leisure  spend time with family, gardening       Cognition   Overall Cognitive Status  Within Functional Limits for tasks assessed      Observation/Other Assessments   Observations  Increased swelling around calves B, no redness or warmth indicated      Sensation   Light Touch  Appears Intact      Functional Tests   Functional tests  Sit to Stand      Squat   Comments  Increased forward knee translation      Sit to Stand   Comments  unable to perform with UE, slowly      Posture/Postural Control   Posture Comments  weight leaning off of R side      ROM / Strength   AROM / PROM / Strength  AROM;Strength      AROM   Overall AROM Comments  R knee: -2 - 100; L knee: 0 - 105      Strength   Right Hip Flexion  4+/5    Right Hip Extension  4-/5    Right Hip External Rotation   4-/5    Right Hip Internal Rotation  4/5    Right Hip ABduction  4-/5    Left Hip Flexion  3+/5   pain   Left Hip Extension  4/5    Left Hip External Rotation  4-/5   pain   Left Hip Internal Rotation  4/5    Left Hip ABduction  4-/5    Right Knee Flexion  4+/5    Right Knee Extension  4+/5   pain   Left Knee Flexion  5/5    Left Knee Extension  5/5      Palpation   Palpation comment  Increased TTP along L glute  med/max, joint line on R      Transfers   Five time sit to stand comments   21sec      Ambulation/Gait   Gait Pattern  Antalgic    Stairs  Yes    Stair Management Technique  Two rails;Step to pattern    Number of Stairs  4    Gait Comments   Lean of the L with weight acceptance onto the L side,       Standardized Balance Assessment   10 Meter Walk  .46m/s      Objective measurements completed on examination: See above findings.   TREATMENT Therapeutic Exercise Bridges in hooklying -- x 20 Lumbar extension in standing -- x20 Lumbar rotations in hooklying and standing -- x 20  Exercises performed to help decrease pain along her affected hip, knee, and lumbar spine. Patient demonstrates decreased pain at the end of the session compared to the start of the session.     PT Education - 12/28/18 1455    Education provided  Yes    Education Details  bridges and CKC lumbar/hip extension in standing     Person(s) Educated  Patient    Methods  Explanation;Demonstration    Comprehension  Verbalized understanding;Returned demonstration          PT Long Term Goals - 12/29/18 1000      PT LONG TERM GOAL #1   Title  Pt will be able to ambulate for 76mWT to >1 m/s to indicate improvement in functional gait and decreased fall risk    Baseline  .77 m/s    Time  6    Period  Weeks    Status  New    Target Date  02/08/19      PT LONG TERM GOAL #2   Title  Patient will improve 5xSTS to under 10sec to indicate significant improvement with functional LE strength.     Baseline  5XSTS: 21 sec    Time  6    Period  Weeks    Status  New    Target Date  02/08/19      PT LONG TERM GOAL #3   Title  Patient will have a worst pain of 3/10 to indicate significant improvement with pain and ability to perform functional activities more functionally.     Baseline  worst pain: 6/10    Time  6    Period  Weeks    Status  New    Target Date  02/08/19      PT LONG TERM GOAL #4   Title  Patient will be able to ascending/ decsend the stairs step over step with UE support to improve dynamic balance     Baseline  Able to perform step-to when acsending    Time  6    Period  Weeks    Status  New    Target Date  02/08/19              Plan - 12/28/18 1459    Clinical Impression Statement  Patient is a 68 yo right hand dominant female presenting with increased right knee pain and L hip pain. Patinet demonstates increased R knee pain most congruent with poor coordination and joint changes indicating dysfunction. The patient's L hip pain most congruent with L hip dysfunction with possible sciatic nerve involvement secondary to a positive SLUMP test. Patient demonstrates decrease in pain at the end of the session after focusing on improving lumbar extension ROM  and improvement with hip abduction. Patient will benefit from further skilled therapy focused on improving hip strength, and knee AROM on the R to return to prior level of function.     Clinical Impairments Affecting Rehab Potential  (+) motivated, acute condition (-) arthritis right knee with pain    PT Frequency  2x / week    PT Duration  6 weeks    PT Treatment/Interventions  Electrical Stimulation;Cryotherapy;Moist Heat;Gait training;Stair training;Therapeutic activities;Therapeutic exercise;Balance training;Patient/family education;Neuromuscular re-education;Manual techniques    PT Next Visit Plan  exercise progression for ROM, strength    PT Home Exercise Plan  see patient education    Consulted and Agree with Plan of Care  Patient       Patient will benefit from skilled therapeutic intervention in order to improve the following deficits and impairments:  Pain, Decreased activity tolerance, Decreased endurance, Decreased range of motion, Decreased strength, Impaired perceived functional ability, Difficulty walking  Visit Diagnosis: Chronic pain of right knee  Pain in left hip  Muscle weakness (generalized)     Problem List Patient Active Problem List   Diagnosis Date Noted  . Primary osteoarthritis of right knee 09/15/2018  . Encounter for pre-operative examination 09/15/2018  . Obstructive sleep apnea 09/15/2018  . Calculus of gallbladder  without cholecystitis without obstruction 09/15/2018  . Calculus of gallbladder with cholecystitis without biliary obstruction 01/27/2018  . Restless leg syndrome 01/27/2018  . Generalized abdominal pain 01/11/2018  . Cyst of pancreas 01/11/2018  . Other specified disorders of kidney and ureter 01/11/2018  . Primary insomnia 01/11/2018  . Paroxysmal atrial fibrillation ([place]) 11/12/2017  . Essential hypertension 10/25/2017  . Mixed hyperlipidemia 10/25/2017  . Low back pain at multiple sites 10/25/2017  . Dysuria 10/25/2017  . Encounter for general adult medical examination with abnormal findings 10/25/2017  . Vitamin D deficiency 10/25/2017  . Near syncope   . Symptomatic bradycardia 10/19/2017  . Carpal tunnel syndrome 10/12/2017  . Primary osteoarthritis of left knee 07/09/2017  . Total knee replacement status, left 07/06/2017  . Chest pain 06/29/2017  . Postop check 02/25/2017  . Impingement syndrome of shoulder region 01/20/2017  . Neck pain 01/20/2017  . Obesity (BMI 35.0-39.9 without comorbidity) 07/15/2016  . Endometrial polyp 07/15/2016  . Morbid obesity ([place]) 07/15/2016  . Family history of breast cancer in first degree relative 07/15/2016  . Family history of ovarian cancer 07/15/2016  . Knee pain 07/04/2016  . Bilateral leg pain 06/24/2016    Blythe Stanford, PT DPT 12/29/2018, 10:22 AM  [place] [facility] [address], [place], [address] Phone: [phone]   Fax:  [phone]  Name: Whitney [name] MRN: [id number] Date of Birth: February 28, 1951

## 2018-12-30 ENCOUNTER — Ambulatory Visit: Payer: Medicare Other

## 2019-01-03 ENCOUNTER — Ambulatory Visit: Payer: Medicare Other | Attending: Orthopedic Surgery

## 2019-01-03 ENCOUNTER — Other Ambulatory Visit: Payer: Self-pay

## 2019-01-03 ENCOUNTER — Encounter
Admission: RE | Admit: 2019-01-03 | Discharge: 2019-01-03 | Disposition: A | Discharge status: Home / Self Care | Payer: Medicare Other | Source: Ambulatory Visit | Attending: Orthopedic Surgery | Admitting: Orthopedic Surgery

## 2019-01-03 DIAGNOSIS — Z01818 Encounter for other preprocedural examination: Secondary | ICD-10-CM | POA: Insufficient documentation

## 2019-01-03 DIAGNOSIS — M6281 Muscle weakness (generalized): Secondary | ICD-10-CM | POA: Insufficient documentation

## 2019-01-03 DIAGNOSIS — G8929 Other chronic pain: Secondary | ICD-10-CM | POA: Insufficient documentation

## 2019-01-03 DIAGNOSIS — M25552 Pain in left hip: Secondary | ICD-10-CM | POA: Insufficient documentation

## 2019-01-03 DIAGNOSIS — R002 Palpitations: Secondary | ICD-10-CM | POA: Diagnosis not present

## 2019-01-03 DIAGNOSIS — M25561 Pain in right knee: Secondary | ICD-10-CM | POA: Insufficient documentation

## 2019-01-03 HISTORY — DX: Dyspnea, unspecified: R06.00

## 2019-01-03 HISTORY — DX: Palpitations: R00.2

## 2019-01-03 HISTORY — DX: Localized edema: R60.0

## 2019-01-03 HISTORY — DX: Cardiac arrhythmia, unspecified: I49.9

## 2019-01-03 LAB — URINALYSIS, ROUTINE W REFLEX MICROSCOPIC
Bilirubin Urine: NEGATIVE
Glucose, UA: NEGATIVE mg/dL
Hgb urine dipstick: NEGATIVE
Ketones, ur: NEGATIVE mg/dL
Leukocytes,Ua: NEGATIVE
Nitrite: NEGATIVE
Protein, ur: 100 mg/dL — AB
Specific Gravity, Urine: 1.014 (ref 1.005–1.030)
pH: 6 (ref 5.0–8.0)

## 2019-01-03 LAB — SURGICAL PCR SCREEN
MRSA, PCR: NEGATIVE
Staphylococcus aureus: NEGATIVE

## 2019-01-03 LAB — TYPE AND SCREEN
ABO/RH(D): B POS
Antibody Screen: NEGATIVE

## 2019-01-03 LAB — CBC
HCT: 41.9 % (ref 36.0–46.0)
Hemoglobin: 13.3 g/dL (ref 12.0–15.0)
MCH: 29.6 pg (ref 26.0–34.0)
MCHC: 31.7 g/dL (ref 30.0–36.0)
MCV: 93.3 fL (ref 80.0–100.0)
Platelets: 243 10*3/uL (ref 150–400)
RBC: 4.49 MIL/uL (ref 3.87–5.11)
RDW: 12.8 % (ref 11.5–15.5)
WBC: 5.5 10*3/uL (ref 4.0–10.5)
nRBC: 0 % (ref 0.0–0.2)

## 2019-01-03 LAB — BASIC METABOLIC PANEL
Anion gap: 6 (ref 5–15)
BUN: 22 mg/dL (ref 8–23)
CO2: 26 mmol/L (ref 22–32)
Calcium: 8.4 mg/dL — ABNORMAL LOW (ref 8.9–10.3)
Chloride: 108 mmol/L (ref 98–111)
Creatinine, Ser: 1.11 mg/dL — ABNORMAL HIGH (ref 0.44–1.00)
GFR calc Af Amer: 59 mL/min — ABNORMAL LOW (ref >60)
GFR calc non Af Amer: 51 mL/min — ABNORMAL LOW (ref >60)
Glucose, Bld: 92 mg/dL (ref 70–99)
Potassium: 4.1 mmol/L (ref 3.5–5.1)
Sodium: 140 mmol/L (ref 135–145)

## 2019-01-03 LAB — PROTIME-INR
INR: 0.9 (ref 0.8–1.2)
Prothrombin Time: 11.7 seconds (ref 11.4–15.2)

## 2019-01-03 LAB — APTT: aPTT: 28 seconds (ref 24–36)

## 2019-01-03 NOTE — Pre-Procedure Instructions (Signed)
Cardiac clearance on chart. 

## 2019-01-03 NOTE — Pre-Procedure Instructions (Signed)
MESSAGED DR FATH RE EKG DONE DUE TO PATIENT COMPLAINT OF PALPITATIONS. OK TO PROCEED

## 2019-01-03 NOTE — Pre-Procedure Instructions (Signed)
Instructed in use of incentive spirometry. Incentive spirometry sent home with patient.

## 2019-01-03 NOTE — Therapy (Signed)
[place] [facility] [address], [place], [address] Phone: [phone]   Fax:  [phone]  Physical Therapy Treatment  Patient Details  Name: Whitney [name] MRN: [id number] Date of Birth: 1950/10/23 Referring Provider (PT): Kurtis Bushman MD   Encounter Date: 01/03/2019  PT End of Session - 01/03/19 1125    Visit Number  2    Number of Visits  13    Date for PT Re-Evaluation  02/08/19    Authorization Time Period  2 / 10 G code    PT Start Time  1100    PT Stop Time  1145    PT Time Calculation (min)  45 min    Activity Tolerance  Patient tolerated treatment well    Behavior During Therapy  [facility] for tasks assessed/performed       Past Medical History:  Diagnosis Date  . Abnormal Pap smear of cervix    Pt states she had colposcopy   . Arthritis   . Chronic kidney disease   . Dyspnea   . Dysrhythmia   . Hx of unilateral nephrectomy 1979   Left Nephrectomy  . Hypertension   . Lower extremity edema   . Palpitations   . PMB (postmenopausal bleeding)   . Restless leg syndrome   . Vitamin D deficiency     Past Surgical History:  Procedure Laterality Date  . HYSTEROSCOPY W/D&C N/A 02/16/2017   Procedure: DILATATION AND CURETTAGE /HYSTEROSCOPY;  Surgeon: Defrancesco, Alanda Slim, MD;  Location: [facility] ORS;  Service: Gynecology;  Laterality: N/A;  . kidney removal Left   . KIDNEY SURGERY Left 1979   left kidney removed  . LEFT HEART CATH AND CORONARY ANGIOGRAPHY N/A 10/21/2017   Procedure: LEFT HEART CATH AND CORONARY ANGIOGRAPHY;  Surgeon: Teodoro Spray, MD;  Location: [place] [facility];  Service: Cardiovascular;  Laterality: N/A;  . TOTAL KNEE ARTHROPLASTY Left 07/06/2017   Procedure: TOTAL KNEE ARTHROPLASTY;  Surgeon: Lovell Sheehan, MD;  Location: [facility] ORS;  Service: Orthopedics;  Laterality: Left;  . WRIST ARTHROSCOPY Left 1970s    There were no vitals filed for this visit.  Subjective Assessment -  01/03/19 1112    Subjective  Patient reports she had an appointment today for her pre-admit for her R knee TKA surgery scheduled on June 1st. Patient states she has had some tightness in her chest today and heart palpitations. Patient went to the pre-admit and had an EKG which resulted in no adverse signals from pt report.     Pertinent History  s/p TKA 07/06/2017; right knee arthritis with weakness as well with reports of right and left leg "giving way" intermittently; Going to have TKA 6/10    Limitations  Sitting;Walking;Standing;House hold activities;Other (comment)    How long can you sit comfortably?  forever    How long can you stand comfortably?  10    How long can you walk comfortably?  25    Diagnostic tests  imaging - see scanned docs    Patient Stated Goals  Improve motion and strength of L hip and R knee    Currently in Pain?  Yes    Pain Score  7     Pain Location  Knee   R Knee and L hip   Pain Orientation  Left    Pain Descriptors / Indicators  Aching    Pain Type  Chronic pain    Pain Onset  More than a  month ago    Pain Frequency  Intermittent       Patient arrived but unable to perform therapy secondary to blood pressure concerns. Patient did not take her BP meds this am. Patient presents with a BP of 183/152mmHg and a HR of 73bpm in the beginning of therapy with minimal heart palpitations throughout the day and the session. We waited 10 minutes in which the patient voiced concerns about the surgery she was about to receive, reporting increased nervousness and anxiety. Measured vitals again which resulted in the BP of 198/135mmHg with a HR of 41 bpm. The HR was confirmed manually.   Guided patient through some breathing techniques to help decrease BP patient laid down for 20 minutes to decrease sympathetic response. Utilizing 4-square breathing techniques to do this. Then remeasured patient's BP which resulted in a BP 110/99mmHg but patient no longer c/o heart palpitations  with a HR of 71bpm.   At this time, we were not comfortable to starting any form of manual or exercise techniques and ended the session. Educated patient that is symptoms of heart palpitations get worse or if she begins to have any shortness of breath, breathing difficulty, unrelenting chest pains, or any type of symptoms that are out of the ordinary to call 911 for emergency services. Patient verbalizes understanding.      PT Education - 01/03/19 1124    Education provided  Yes    Education Details  form/technique with exercise    Person(s) Educated  Patient    Methods  Explanation;Demonstration    Comprehension  Verbalized understanding;Returned demonstration          PT Long Term Goals - 12/29/18 1000      PT LONG TERM GOAL #1   Title  Pt will be able to ambulate for 14mWT to >1 m/s to indicate improvement in functional gait and decreased fall risk    Baseline  .77 m/s    Time  6    Period  Weeks    Status  New    Target Date  02/08/19      PT LONG TERM GOAL #2   Title  Patient will improve 5xSTS to under 10sec to indicate significant improvement with functional LE strength.     Baseline  5XSTS: 21 sec    Time  6    Period  Weeks    Status  New    Target Date  02/08/19      PT LONG TERM GOAL #3   Title  Patient will have a worst pain of 3/10 to indicate significant improvement with pain and ability to perform functional activities more functionally.     Baseline  worst pain: 6/10    Time  6    Period  Weeks    Status  New    Target Date  02/08/19      PT LONG TERM GOAL #4   Title  Patient will be able to ascending/ decsend the stairs step over step with UE support to improve dynamic balance     Baseline  Able to perform step-to when acsending    Time  6    Period  Weeks    Status  New    Target Date  02/08/19              Patient will benefit from skilled therapeutic intervention in order to improve the following deficits and impairments:     Visit  Diagnosis: Chronic pain of right knee  Problem List Patient Active Problem List   Diagnosis Date Noted  . Primary osteoarthritis of right knee 09/15/2018  . Encounter for pre-operative examination 09/15/2018  . Obstructive sleep apnea 09/15/2018  . Calculus of gallbladder without cholecystitis without obstruction 09/15/2018  . Calculus of gallbladder with cholecystitis without biliary obstruction 01/27/2018  . Restless leg syndrome 01/27/2018  . Generalized abdominal pain 01/11/2018  . Cyst of pancreas 01/11/2018  . Other specified disorders of kidney and ureter 01/11/2018  . Primary insomnia 01/11/2018  . Paroxysmal atrial fibrillation ([place]) 11/12/2017  . Essential hypertension 10/25/2017  . Mixed hyperlipidemia 10/25/2017  . Low back pain at multiple sites 10/25/2017  . Dysuria 10/25/2017  . Encounter for general adult medical examination with abnormal findings 10/25/2017  . Vitamin D deficiency 10/25/2017  . Near syncope   . Symptomatic bradycardia 10/19/2017  . Carpal tunnel syndrome 10/12/2017  . Primary osteoarthritis of left knee 07/09/2017  . Total knee replacement status, left 07/06/2017  . Chest pain 06/29/2017  . Postop check 02/25/2017  . Impingement syndrome of shoulder region 01/20/2017  . Neck pain 01/20/2017  . Obesity (BMI 35.0-39.9 without comorbidity) 07/15/2016  . Endometrial polyp 07/15/2016  . Morbid obesity ([place]) 07/15/2016  . Family history of breast cancer in first degree relative 07/15/2016  . Family history of ovarian cancer 07/15/2016  . Knee pain 07/04/2016  . Bilateral leg pain 06/24/2016    Blythe Stanford, PT DPT 01/03/2019, 11:53 AM  [facility] [address] [place], [address] Phone: [phone]   Fax:  [phone]  Name: Whitney [name] MRN: [id number] Date of Birth: [birth year]

## 2019-01-03 NOTE — Patient Instructions (Signed)
Your procedure is scheduled on: 01/12/2019 Wed Report to Same Day Surgery 2nd floor medical mall [facility] Entrance-take elevator on left to 2nd floor.  Check in with surgery information desk.) To find out your arrival time please call [phone] between 1PM - 3PM on 01/11/2019 Tues  Remember: Instructions that are not followed completely may result in serious medical risk, up to and including death, or upon the discretion of your surgeon and anesthesiologist your surgery may need to be rescheduled.    _x___ 1. Do not eat food after midnight the night before your procedure. You may drink clear liquids up to 2 hours before you are scheduled to arrive at the hospital for your procedure.  Do not drink clear liquids within 2 hours of your scheduled arrival to the hospital.  Clear liquids include  --Water or Apple juice without pulp  --Clear carbohydrate beverage such as ClearFast or Gatorade  --Black Coffee or Clear Tea (No milk, no creamers, do not add anything to                  the coffee or Tea Type 1 and type 2 diabetics should only drink water.   ____Ensure clear carbohydrate drink on the way to the hospital for bariatric patients  ____Ensure clear carbohydrate drink 3 hours before surgery for Dr Dwyane Luo patients if physician instructed.   No gum chewing or hard candies.     __x__ 2. No Alcohol for 24 hours before or after surgery.   __x__3. No Smoking or e-cigarettes for 24 prior to surgery.  Do not use any chewable tobacco products for at least 6 hour prior to surgery   ____  4. Bring all medications with you on the day of surgery if instructed.    __x__ 5. Notify your doctor if there is any change in your medical condition     (cold, fever, infections).    x___6. On the morning of surgery brush your teeth with toothpaste and water.  You may rinse your mouth with mouth wash if you wish.  Do not swallow any toothpaste or mouthwash.   Do not wear jewelry, make-up, hairpins,  clips or nail polish.  Do not wear lotions, powders, or perfumes. You may wear deodorant.  Do not shave 48 hours prior to surgery. Men may shave face and neck.  Do not bring valuables to the hospital.    [facility] is not responsible for any belongings or valuables.               Contacts, dentures or bridgework may not be worn into surgery.  Leave your suitcase in the car. After surgery it may be brought to your room.  For patients admitted to the hospital, discharge time is determined by your                       treatment team.  _  Patients discharged the day of surgery will not be allowed to drive home.  You will need someone to drive you home and stay with you the night of your procedure.    Please read over the following fact sheets that you were given:   [facility] Preparing for Surgery and or MRSA Information   _x___ Take anti-hypertensive listed below, cardiac, seizure, asthma,     anti-reflux and psychiatric medicines. These include:  1. carvedilol (COREG)  2.famotidine (PEPCID  3.flecainide (TAMBOCOR  4.gabapentin (NEURONTIN  5.hydrALAZINE (APRESOLINE  6.  ____Fleets enema  or Magnesium Citrate as directed.   _x___ Use CHG Soap or sage wipes as directed on instruction sheet   ____ Use inhalers on the day of surgery and bring to hospital day of surgery  ____ Stop Metformin and Janumet 2 days prior to surgery.    ____ Take 1/2 of usual insulin dose the night before surgery and none on the morning     surgery.   _x___ Follow recommendations from Cardiologist, Pulmonologist or PCP regarding          stopping Aspirin, Coumadin, Plavix ,Eliquis, Effient, or Pradaxa, and Pletal.  X____Stop Anti-inflammatories such as Advil, Aleve, Ibuprofen, Motrin, Naproxen, Naprosyn, Goodies powders or aspirin products. OK to take Tylenol and                          Celebrex.   _x___ Stop supplements until after surgery.  But may continue Vitamin D, Vitamin B,       and  multivitamin.   ____ Bring C-Pap to the hospital.

## 2019-01-04 DIAGNOSIS — M7062 Trochanteric bursitis, left hip: Secondary | ICD-10-CM | POA: Diagnosis not present

## 2019-01-06 ENCOUNTER — Other Ambulatory Visit: Payer: Self-pay

## 2019-01-06 ENCOUNTER — Ambulatory Visit: Payer: Medicare Other

## 2019-01-06 DIAGNOSIS — M25552 Pain in left hip: Secondary | ICD-10-CM

## 2019-01-06 DIAGNOSIS — G8929 Other chronic pain: Secondary | ICD-10-CM

## 2019-01-06 DIAGNOSIS — M6281 Muscle weakness (generalized): Secondary | ICD-10-CM | POA: Diagnosis present

## 2019-01-06 DIAGNOSIS — M25561 Pain in right knee: Secondary | ICD-10-CM | POA: Diagnosis not present

## 2019-01-06 NOTE — Therapy (Signed)
[facility] [address] [place], [address] Phone: [phone]   Fax:  [phone]  Physical Therapy Treatment  Patient Details  Name: Whitney [name] MRN: [id number] Date of Birth: 1950/10/30 Referring Provider (PT): Kurtis Bushman MD   Encounter Date: 01/06/2019  PT End of Session - 01/06/19 0958    Visit Number  3    Number of Visits  13    Date for PT Re-Evaluation  02/08/19    Authorization Time Period  3 / 10 G code    PT Start Time  0900    PT Stop Time  0945    PT Time Calculation (min)  45 min    Activity Tolerance  Patient tolerated treatment well    Behavior During Therapy  [facility] for tasks assessed/performed       Past Medical History:  Diagnosis Date  . Abnormal Pap smear of cervix    Pt states she had colposcopy   . Arthritis   . Chronic kidney disease   . Dyspnea   . Dysrhythmia   . Hx of unilateral nephrectomy 1979   Left Nephrectomy  . Hypertension   . Lower extremity edema   . Palpitations   . PMB (postmenopausal bleeding)   . Restless leg syndrome   . Vitamin D deficiency     Past Surgical History:  Procedure Laterality Date  . HYSTEROSCOPY W/D&C N/A 02/16/2017   Procedure: DILATATION AND CURETTAGE /HYSTEROSCOPY;  Surgeon: Defrancesco, Alanda Slim, MD;  Location: [facility]S;  Service: Gynecology;  Laterality: N/A;  . kidney removal Left   . KIDNEY SURGERY Left 1979   left kidney removed  . LEFT HEART CATH AND CORONARY ANGIOGRAPHY N/A 10/21/2017   Procedure: LEFT HEART CATH AND CORONARY ANGIOGRAPHY;  Surgeon: Teodoro Spray, MD;  Location: [place] [facility];  Service: Cardiovascular;  Laterality: N/A;  . TOTAL KNEE ARTHROPLASTY Left 07/06/2017   Procedure: TOTAL KNEE ARTHROPLASTY;  Surgeon: Lovell Sheehan, MD;  Location: [facility];  Service: Orthopedics;  Laterality: Left;  . WRIST ARTHROSCOPY Left 1970s    There were no vitals filed for this visit.  Subjective Assessment -  01/06/19 0921    Subjective  Patient reports she went to her PCP yesterday how increased her BP medication secondary to her BP being elevated.     Pertinent History  s/p TKA 07/06/2017; right knee arthritis with weakness as well with reports of right and left leg "giving way" intermittently; Going to have TKA 6/10    Limitations  Sitting;Walking;Standing;House hold activities;Other (comment)    How long can you sit comfortably?  forever    How long can you stand comfortably?  10    How long can you walk comfortably?  25    Diagnostic tests  imaging - see scanned docs    Patient Stated Goals  Improve motion and strength of L hip and R knee    Currently in Pain?  Yes    Pain Score  6     Pain Location  Hip   knee   Pain Orientation  Left;Right    Pain Descriptors / Indicators  Aching    Pain Type  Chronic pain    Pain Onset  More than a month ago    Pain Frequency  Intermittent        TREATMENT Therapeutic Exercise: Hip abduction -- with UE support in standing -- 2 x 10 Hip extension -- UE suport in  standing -- 2 x 10 B knee extension in sitting with B 7.5 # -- x20 with SLR at end range B hip/lumbar extension -- x 20   Patient's BP 160/108 initial session, 164/102 8 min into the session, 155/54mmHg 15 min into the session  Performed exercises in standing focusing on improving hip/lumbar extension to improve low back/ hip pain. Performed exercises in sitting to improve quad strength and improve terminal knee extension         PT Education - 01/06/19 0957    Education provided  Yes    Education Details  Form/technique with exercise; educated on importance of BP maintainence     Person(s) Educated  Patient    Methods  Explanation;Demonstration    Comprehension  Verbalized understanding;Returned demonstration          PT Long Term Goals - 12/29/18 1000      PT LONG TERM GOAL #1   Title  Pt will be able to ambulate for 14mWT to >1 m/s to indicate improvement in  functional gait and decreased fall risk    Baseline  .77 m/s    Time  6    Period  Weeks    Status  New    Target Date  02/08/19      PT LONG TERM GOAL #2   Title  Patient will improve 5xSTS to under 10sec to indicate significant improvement with functional LE strength.     Baseline  5XSTS: 21 sec    Time  6    Period  Weeks    Status  New    Target Date  02/08/19      PT LONG TERM GOAL #3   Title  Patient will have a worst pain of 3/10 to indicate significant improvement with pain and ability to perform functional activities more functionally.     Baseline  worst pain: 6/10    Time  6    Period  Weeks    Status  New    Target Date  02/08/19      PT LONG TERM GOAL #4   Title  Patient will be able to ascending/ decsend the stairs step over step with UE support to improve dynamic balance     Baseline  Able to perform step-to when acsending    Time  6    Period  Weeks    Status  New    Target Date  02/08/19            Plan - 01/06/19 0958    Clinical Impression Statement  Patient's BP started elevated during the beginning of the session but decreased after performing breathing exercises in supine. Performed lighter exercises today secondary to pt's BP elevation. Patient tolerated the exercises well and we performed light exercises to not increased her BP further. Patient also demonstrates ability to perform exercises with centralization with back bends in standing indicating improvement in hip strength. Pt will benefit from further skilled therapy focused on improving limitations to return to prior level of function.     Clinical Impairments Affecting Rehab Potential  (+) motivated, acute condition (-) arthritis right knee with pain    PT Frequency  2x / week    PT Duration  6 weeks    PT Treatment/Interventions  Electrical Stimulation;Cryotherapy;Moist Heat;Gait training;Stair training;Therapeutic activities;Therapeutic exercise;Balance training;Patient/family  education;Neuromuscular re-education;Manual techniques    PT Next Visit Plan  exercise progression for ROM, strength    PT Home Exercise Plan  see patient education  Consulted and Agree with Plan of Care  Patient       Patient will benefit from skilled therapeutic intervention in order to improve the following deficits and impairments:  Pain, Decreased activity tolerance, Decreased endurance, Decreased range of motion, Decreased strength, Impaired perceived functional ability, Difficulty walking  Visit Diagnosis: Chronic pain of right knee  Pain in left hip  Muscle weakness (generalized)     Problem List Patient Active Problem List   Diagnosis Date Noted  . Primary osteoarthritis of right knee 09/15/2018  . Encounter for pre-operative examination 09/15/2018  . Obstructive sleep apnea 09/15/2018  . Calculus of gallbladder without cholecystitis without obstruction 09/15/2018  . Calculus of gallbladder with cholecystitis without biliary obstruction 01/27/2018  . Restless leg syndrome 01/27/2018  . Generalized abdominal pain 01/11/2018  . Cyst of pancreas 01/11/2018  . Other specified disorders of kidney and ureter 01/11/2018  . Primary insomnia 01/11/2018  . Paroxysmal atrial fibrillation ([place]) 11/12/2017  . Essential hypertension 10/25/2017  . Mixed hyperlipidemia 10/25/2017  . Low back pain at multiple sites 10/25/2017  . Dysuria 10/25/2017  . Encounter for general adult medical examination with abnormal findings 10/25/2017  . Vitamin D deficiency 10/25/2017  . Near syncope   . Symptomatic bradycardia 10/19/2017  . Carpal tunnel syndrome 10/12/2017  . Primary osteoarthritis of left knee 07/09/2017  . Total knee replacement status, left 07/06/2017  . Chest pain 06/29/2017  . Postop check 02/25/2017  . Impingement syndrome of shoulder region 01/20/2017  . Neck pain 01/20/2017  . Obesity (BMI 35.0-39.9 without comorbidity) 07/15/2016  . Endometrial polyp 07/15/2016  .  Morbid obesity ([place]) 07/15/2016  . Family history of breast cancer in first degree relative 07/15/2016  . Family history of ovarian cancer 07/15/2016  . Knee pain 07/04/2016  . Bilateral leg pain 06/24/2016    Blythe Stanford, PT DPT 01/06/2019, 10:02 AM  [facility] [address] [place], [address] Phone: [phone]   Fax:  [phone]  Name: LADIAMOND GALLINA MRN: [id number] Date of Birth: 05-06-1951

## 2019-01-07 ENCOUNTER — Inpatient Hospital Stay: Admission: RE | Admit: 2019-01-07 | Payer: Medicare Other | Source: Ambulatory Visit

## 2019-01-11 ENCOUNTER — Other Ambulatory Visit: Payer: Self-pay

## 2019-01-11 ENCOUNTER — Ambulatory Visit: Payer: Medicare Other

## 2019-01-11 DIAGNOSIS — M25552 Pain in left hip: Secondary | ICD-10-CM | POA: Diagnosis not present

## 2019-01-11 DIAGNOSIS — G8929 Other chronic pain: Secondary | ICD-10-CM

## 2019-01-11 DIAGNOSIS — M6281 Muscle weakness (generalized): Secondary | ICD-10-CM

## 2019-01-11 DIAGNOSIS — M25561 Pain in right knee: Secondary | ICD-10-CM | POA: Diagnosis not present

## 2019-01-11 NOTE — Therapy (Signed)
[facility] [place] [facility] [address], [place], [address] Phone: [phone]   Fax:  [phone]  Physical Therapy Treatment  Patient Details  Name: Whitney [name] MRN: [id number] Date of Birth: 06-21-1951 Referring Provider (PT): Kurtis Bushman MD   Encounter Date: 01/11/2019  PT End of Session - 01/11/19 1513    Visit Number  4    Number of Visits  13    Date for PT Re-Evaluation  02/08/19    Authorization Time Period  4 / 10 G code    PT Start Time  1500    PT Stop Time  1545    PT Time Calculation (min)  45 min    Activity Tolerance  Patient tolerated treatment well    Behavior During Therapy  [facility] for tasks assessed/performed       Past Medical History:  Diagnosis Date  . Abnormal Pap smear of cervix    Pt states she had colposcopy   . Arthritis   . Chronic kidney disease   . Dyspnea   . Dysrhythmia   . Hx of unilateral nephrectomy 1979   Left Nephrectomy  . Hypertension   . Lower extremity edema   . Palpitations   . PMB (postmenopausal bleeding)   . Restless leg syndrome   . Vitamin D deficiency     Past Surgical History:  Procedure Laterality Date  . HYSTEROSCOPY W/D&C N/A 02/16/2017   Procedure: DILATATION AND CURETTAGE /HYSTEROSCOPY;  Surgeon: Defrancesco, Alanda Slim, MD;  Location: [facility];  Service: Gynecology;  Laterality: N/A;  . kidney removal Left   . KIDNEY SURGERY Left 1979   left kidney removed  . LEFT HEART CATH AND CORONARY ANGIOGRAPHY N/A 10/21/2017   Procedure: LEFT HEART CATH AND CORONARY ANGIOGRAPHY;  Surgeon: Teodoro Spray, MD;  Location: [place] [facility];  Service: Cardiovascular;  Laterality: N/A;  . TOTAL KNEE ARTHROPLASTY Left 07/06/2017   Procedure: TOTAL KNEE ARTHROPLASTY;  Surgeon: Lovell Sheehan, MD;  Location: [facility];  Service: Orthopedics;  Laterality: Left;  . WRIST ARTHROSCOPY Left 1970s    There were no vitals filed for this visit.  Subjective Assessment -  01/11/19 1508    Subjective  Patient reports increased pain on the L side of her head reports it's sharp and only happens intermittently. Patient reports no new symptoms otherwise. Patient reports she would like to start working on exercises.     Pertinent History  s/p TKA 07/06/2017; right knee arthritis with weakness as well with reports of right and left leg "giving way" intermittently; Going to have TKA 6/10    Limitations  Sitting;Walking;Standing;House hold activities;Other (comment)    How long can you sit comfortably?  forever    How long can you stand comfortably?  10    How long can you walk comfortably?  25    Diagnostic tests  imaging - see scanned docs    Patient Stated Goals  Improve motion and strength of L hip and R knee    Currently in Pain?  Yes    Pain Score  6     Pain Location  Knee   hip and knee   Pain Orientation  Right;Left    Pain Descriptors / Indicators  Aching    Pain Type  Chronic pain    Pain Onset  More than a month ago    Pain Frequency  Intermittent       TREATMENT Therapeutic Exercise: Hip abduction --  with UE support in standing -- 2 x 10 Hip extension -- UE suport in standing -- 2 x 10 B knee extension in sitting with B 10# -- x20 with SLR at end range B hip/lumbar extension -- x 20  Standing marches with B UE support - x20 Leg press B LE - 85# x 15; #55 x3 unilat on R; #45 x 3 on R   Patient's BP 182/80 initial session, 155/70 5 min into the session,   Performed exercises in standing focusing on improving hip/lumbar extension to improve low back/ hip pain. Performed exercises in sitting to improve quad strength and improve terminal knee extension   PT Education - 01/11/19 1512    Education provided  Yes    Education Details  Educated on blood pressure and importance of checking her vitals often.     Person(s) Educated  Patient    Methods  Explanation;Demonstration    Comprehension  Verbalized understanding;Returned demonstration           PT Long Term Goals - 12/29/18 1000      PT LONG TERM GOAL #1   Title  Pt will be able to ambulate for 35mWT to >1 m/s to indicate improvement in functional gait and decreased fall risk    Baseline  .77 m/s    Time  6    Period  Weeks    Status  New    Target Date  02/08/19      PT LONG TERM GOAL #2   Title  Patient will improve 5xSTS to under 10sec to indicate significant improvement with functional LE strength.     Baseline  5XSTS: 21 sec    Time  6    Period  Weeks    Status  New    Target Date  02/08/19      PT LONG TERM GOAL #3   Title  Patient will have a worst pain of 3/10 to indicate significant improvement with pain and ability to perform functional activities more functionally.     Baseline  worst pain: 6/10    Time  6    Period  Weeks    Status  New    Target Date  02/08/19      PT LONG TERM GOAL #4   Title  Patient will be able to ascending/ decsend the stairs step over step with UE support to improve dynamic balance     Baseline  Able to perform step-to when acsending    Time  6    Period  Weeks    Status  New    Target Date  02/08/19            Plan - 01/11/19 1555    Clinical Impression Statement  BP is better controlled during today's session compared to previous sessions and continued with POC. Patient demonstrates decreased pain after performing quad strengthening exercises indicating poor coordination. Patient lacks full knee extension at end range on the R, however, this is only by a few degrees. Patient will benefit from further skilled therapy focused on improving limitations to return to prior level of function.     Clinical Impairments Affecting Rehab Potential  (+) motivated, acute condition (-) arthritis right knee with pain    PT Frequency  2x / week    PT Duration  6 weeks    PT Treatment/Interventions  Electrical Stimulation;Cryotherapy;Moist Heat;Gait training;Stair training;Therapeutic activities;Therapeutic exercise;Balance  training;Patient/family education;Neuromuscular re-education;Manual techniques    PT Next Visit Plan  exercise progression for ROM, strength    PT Home Exercise Plan  see patient education    Consulted and Agree with Plan of Care  Patient       Patient will benefit from skilled therapeutic intervention in order to improve the following deficits and impairments:  Pain, Decreased activity tolerance, Decreased endurance, Decreased range of motion, Decreased strength, Impaired perceived functional ability, Difficulty walking  Visit Diagnosis: Chronic pain of right knee  Pain in left hip  Muscle weakness (generalized)     Problem List Patient Active Problem List   Diagnosis Date Noted  . Primary osteoarthritis of right knee 09/15/2018  . Encounter for pre-operative examination 09/15/2018  . Obstructive sleep apnea 09/15/2018  . Calculus of gallbladder without cholecystitis without obstruction 09/15/2018  . Calculus of gallbladder with cholecystitis without biliary obstruction 01/27/2018  . Restless leg syndrome 01/27/2018  . Generalized abdominal pain 01/11/2018  . Cyst of pancreas 01/11/2018  . Other specified disorders of kidney and ureter 01/11/2018  . Primary insomnia 01/11/2018  . Paroxysmal atrial fibrillation ([place]) 11/12/2017  . Essential hypertension 10/25/2017  . Mixed hyperlipidemia 10/25/2017  . Low back pain at multiple sites 10/25/2017  . Dysuria 10/25/2017  . Encounter for general adult medical examination with abnormal findings 10/25/2017  . Vitamin D deficiency 10/25/2017  . Near syncope   . Symptomatic bradycardia 10/19/2017  . Carpal tunnel syndrome 10/12/2017  . Primary osteoarthritis of left knee 07/09/2017  . Total knee replacement status, left 07/06/2017  . Chest pain 06/29/2017  . Postop check 02/25/2017  . Impingement syndrome of shoulder region 01/20/2017  . Neck pain 01/20/2017  . Obesity (BMI 35.0-39.9 without comorbidity) 07/15/2016  .  Endometrial polyp 07/15/2016  . Morbid obesity ([place]) 07/15/2016  . Family history of breast cancer in first degree relative 07/15/2016  . Family history of ovarian cancer 07/15/2016  . Knee pain 07/04/2016  . Bilateral leg pain 06/24/2016    Blythe Stanford, PT DPT 01/11/2019, 4:01 PM  [place] [address], [place], [address] Phone: [phone]   Fax:  [phone]  Name: ORALEE RAPAPORT MRN: [id number] Date of Birth: [birth year]

## 2019-01-12 ENCOUNTER — Inpatient Hospital Stay: Admission: RE | Admit: 2019-01-12 | Payer: Medicare Other | Source: Home / Self Care | Admitting: Orthopedic Surgery

## 2019-01-12 ENCOUNTER — Encounter: Admission: RE | Payer: Self-pay | Source: Home / Self Care

## 2019-01-12 SURGERY — ARTHROPLASTY, KNEE, TOTAL
Anesthesia: Choice | Laterality: Right

## 2019-01-13 ENCOUNTER — Ambulatory Visit: Payer: Medicare Other

## 2019-01-13 ENCOUNTER — Other Ambulatory Visit: Payer: Self-pay

## 2019-01-13 DIAGNOSIS — M25552 Pain in left hip: Secondary | ICD-10-CM

## 2019-01-13 DIAGNOSIS — M25561 Pain in right knee: Secondary | ICD-10-CM | POA: Diagnosis not present

## 2019-01-13 DIAGNOSIS — G8929 Other chronic pain: Secondary | ICD-10-CM

## 2019-01-13 DIAGNOSIS — M6281 Muscle weakness (generalized): Secondary | ICD-10-CM

## 2019-01-13 NOTE — Therapy (Signed)
[facility] [address] [place], [address] Phone: [phone]   Fax:  [phone]  Physical Therapy Treatment  Patient Details  Name: Whitney [name] MRN: [id number] Date of Birth: 1950-11-16 Referring Provider (PT): Kurtis Bushman MD   Encounter Date: 01/13/2019  PT End of Session - 01/13/19 1119    Visit Number  5    Number of Visits  13    Date for PT Re-Evaluation  02/08/19    Authorization Time Period  5 / 10 G code    PT Start Time  1102    PT Stop Time  1145    PT Time Calculation (min)  43 min    Activity Tolerance  Patient tolerated treatment well    Behavior During Therapy  [facility] for tasks assessed/performed       Past Medical History:  Diagnosis Date  . Abnormal Pap smear of cervix    Pt states she had colposcopy   . Arthritis   . Chronic kidney disease   . Dyspnea   . Dysrhythmia   . Hx of unilateral nephrectomy 1979   Left Nephrectomy  . Hypertension   . Lower extremity edema   . Palpitations   . PMB (postmenopausal bleeding)   . Restless leg syndrome   . Vitamin D deficiency     Past Surgical History:  Procedure Laterality Date  . HYSTEROSCOPY W/D&C N/A 02/16/2017   Procedure: DILATATION AND CURETTAGE /HYSTEROSCOPY;  Surgeon: Defrancesco, Alanda Slim, MD;  Location: [facility];  Service: Gynecology;  Laterality: N/A;  . kidney removal Left   . KIDNEY SURGERY Left 1979   left kidney removed  . LEFT HEART CATH AND CORONARY ANGIOGRAPHY N/A 10/21/2017   Procedure: LEFT HEART CATH AND CORONARY ANGIOGRAPHY;  Surgeon: Teodoro Spray, MD;  Location: [place] [facility];  Service: Cardiovascular;  Laterality: N/A;  . TOTAL KNEE ARTHROPLASTY Left 07/06/2017   Procedure: TOTAL KNEE ARTHROPLASTY;  Surgeon: Lovell Sheehan, MD;  Location: [facility];  Service: Orthopedics;  Laterality: Left;  . WRIST ARTHROSCOPY Left 1970s    There were no vitals filed for this visit.  Subjective Assessment -  01/13/19 1105    Subjective  Patient reports no major changes since the previous session. Patient states she felt better after the previous session.    Pertinent History  s/p TKA 07/06/2017; right knee arthritis with weakness as well with reports of right and left leg "giving way" intermittently; Going to have TKA 6/10    Limitations  Sitting;Walking;Standing;House hold activities;Other (comment)    How long can you sit comfortably?  forever    How long can you stand comfortably?  10    How long can you walk comfortably?  25    Diagnostic tests  imaging - see scanned docs    Patient Stated Goals  Improve motion and strength of L hip and R knee    Currently in Pain?  No/denies    Pain Onset  More than a month ago       TREATMENT Therapeutic Exercise:  Patient's BP 184/84 initial session, 162/86 5 min into the session,  B knee extension in sitting with B 10# -- x20 with SLR at end range; unilaterally  Performed BP measurement  after exercise with 220/86 waited 8 min; 210/80 after 12 min; waited another 8 min patient's BP: 205/39mmHg   Although patient was not having any active symptoms, educated patient to go to the ED  since patient's BP 205/21mmHg. Also educated patient to call her cardiologist for a further follow up.     PT Education - 01/13/19 1110    Education provided  Yes    Education Details  Continued to educate on appropriate measurement of BP; Educated to go to the ED secondary to increased BP of 200/80mmHg after 15 min of exercise    Person(s) Educated  Patient    Methods  Explanation;Demonstration    Comprehension  Verbalized understanding;Returned demonstration          PT Long Term Goals - 12/29/18 1000      PT LONG TERM GOAL #1   Title  Pt will be able to ambulate for 5mWT to >1 m/s to indicate improvement in functional gait and decreased fall risk    Baseline  .77 m/s    Time  6    Period  Weeks    Status  New    Target Date  02/08/19      PT LONG TERM  GOAL #2   Title  Patient will improve 5xSTS to under 10sec to indicate significant improvement with functional LE strength.     Baseline  5XSTS: 21 sec    Time  6    Period  Weeks    Status  New    Target Date  02/08/19      PT LONG TERM GOAL #3   Title  Patient will have a worst pain of 3/10 to indicate significant improvement with pain and ability to perform functional activities more functionally.     Baseline  worst pain: 6/10    Time  6    Period  Weeks    Status  New    Target Date  02/08/19      PT LONG TERM GOAL #4   Title  Patient will be able to ascending/ decsend the stairs step over step with UE support to improve dynamic balance     Baseline  Able to perform step-to when acsending    Time  6    Period  Weeks    Status  New    Target Date  02/08/19            Plan - 01/13/19 1508    Clinical Impression Statement  Patient demonstrates limitation in exercise performance secondary to elevated BP. Patient demonstrates a BP of 164/79mmHg at the beginning of the session and started performing exercises in sitting at lesser intensities to improve LE strength and elevate BP to a lesser extet. Measured BP 8 min after exercise received a reading of 220/86; then again after 12 min and received a reading of 210/86, and 5 min after that, received a reading of 205/47mmHg. Educated patient to go to ED and patient verbally confirms understanding with request however is reluctant to go to the ED secondary to COVID and lack of symptoms. Patient's hypertension limiting progress in physical therapy and requires greater management of BP for further progression in therapy.    Clinical Impairments Affecting Rehab Potential  (+) motivated, acute condition (-) arthritis right knee with pain    PT Frequency  2x / week    PT Duration  6 weeks    PT Treatment/Interventions  Electrical Stimulation;Cryotherapy;Moist Heat;Gait training;Stair training;Therapeutic activities;Therapeutic  exercise;Balance training;Patient/family education;Neuromuscular re-education;Manual techniques    PT Next Visit Plan  exercise progression for ROM, strength    PT Home Exercise Plan  see patient education    Consulted and Agree with Plan of  Care  Patient       Patient will benefit from skilled therapeutic intervention in order to improve the following deficits and impairments:  Pain, Decreased activity tolerance, Decreased endurance, Decreased range of motion, Decreased strength, Impaired perceived functional ability, Difficulty walking  Visit Diagnosis: Chronic pain of right knee  Pain in left hip  Muscle weakness (generalized)     Problem List Patient Active Problem List   Diagnosis Date Noted  . Primary osteoarthritis of right knee 09/15/2018  . Encounter for pre-operative examination 09/15/2018  . Obstructive sleep apnea 09/15/2018  . Calculus of gallbladder without cholecystitis without obstruction 09/15/2018  . Calculus of gallbladder with cholecystitis without biliary obstruction 01/27/2018  . Restless leg syndrome 01/27/2018  . Generalized abdominal pain 01/11/2018  . Cyst of pancreas 01/11/2018  . Other specified disorders of kidney and ureter 01/11/2018  . Primary insomnia 01/11/2018  . Paroxysmal atrial fibrillation ([place]) 11/12/2017  . Essential hypertension 10/25/2017  . Mixed hyperlipidemia 10/25/2017  . Low back pain at multiple sites 10/25/2017  . Dysuria 10/25/2017  . Encounter for general adult medical examination with abnormal findings 10/25/2017  . Vitamin D deficiency 10/25/2017  . Near syncope   . Symptomatic bradycardia 10/19/2017  . Carpal tunnel syndrome 10/12/2017  . Primary osteoarthritis of left knee 07/09/2017  . Total knee replacement status, left 07/06/2017  . Chest pain 06/29/2017  . Postop check 02/25/2017  . Impingement syndrome of shoulder region 01/20/2017  . Neck pain 01/20/2017  . Obesity (BMI 35.0-39.9 without comorbidity)  07/15/2016  . Endometrial polyp 07/15/2016  . Morbid obesity ([place]) 07/15/2016  . Family history of breast cancer in first degree relative 07/15/2016  . Family history of ovarian cancer 07/15/2016  . Knee pain 07/04/2016  . Bilateral leg pain 06/24/2016    Blythe Stanford, PT DPT 01/13/2019, 3:19 PM  [facility] [address], [place], [address] Phone: [phone]   Fax:  [phone]  Name: SAKIYA STEPKA MRN: [id number] Date of Birth: March 23, 1951

## 2019-01-17 ENCOUNTER — Other Ambulatory Visit: Payer: Self-pay

## 2019-01-17 ENCOUNTER — Ambulatory Visit: Payer: Medicare Other

## 2019-01-17 DIAGNOSIS — M25552 Pain in left hip: Secondary | ICD-10-CM | POA: Diagnosis not present

## 2019-01-17 DIAGNOSIS — M25561 Pain in right knee: Secondary | ICD-10-CM

## 2019-01-17 DIAGNOSIS — M6281 Muscle weakness (generalized): Secondary | ICD-10-CM | POA: Diagnosis not present

## 2019-01-17 DIAGNOSIS — G8929 Other chronic pain: Secondary | ICD-10-CM | POA: Diagnosis not present

## 2019-01-17 NOTE — Therapy (Signed)
[facility] [address] [place], [address] Phone: [phone]   Fax:  [phone]  Physical Therapy Treatment  Patient Details  Name: Whitney [name] MRN: [id number] Date of Birth: 01-Nov-1950 Referring Provider (PT): Kurtis Bushman MD   Encounter Date: 01/17/2019  PT End of Session - 01/17/19 0949    Visit Number  6    Number of Visits  13    Date for PT Re-Evaluation  02/08/19    Authorization Time Period  6 / 10 G code    PT Start Time  0900    PT Stop Time  0945    PT Time Calculation (min)  45 min    Activity Tolerance  Patient tolerated treatment well    Behavior During Therapy  [facility] for tasks assessed/performed       Past Medical History:  Diagnosis Date  . Abnormal Pap smear of cervix    Pt states she had colposcopy   . Arthritis   . Chronic kidney disease   . Dyspnea   . Dysrhythmia   . Hx of unilateral nephrectomy 1979   Left Nephrectomy  . Hypertension   . Lower extremity edema   . Palpitations   . PMB (postmenopausal bleeding)   . Restless leg syndrome   . Vitamin D deficiency     Past Surgical History:  Procedure Laterality Date  . HYSTEROSCOPY W/D&C N/A 02/16/2017   Procedure: DILATATION AND CURETTAGE /HYSTEROSCOPY;  Surgeon: Defrancesco, Alanda Slim, MD;  Location: [facility];  Service: Gynecology;  Laterality: N/A;  . kidney removal Left   . KIDNEY SURGERY Left 1979   left kidney removed  . LEFT HEART CATH AND CORONARY ANGIOGRAPHY N/A 10/21/2017   Procedure: LEFT HEART CATH AND CORONARY ANGIOGRAPHY;  Surgeon: Teodoro Spray, MD;  Location: [place] [facility];  Service: Cardiovascular;  Laterality: N/A;  . TOTAL KNEE ARTHROPLASTY Left 07/06/2017   Procedure: TOTAL KNEE ARTHROPLASTY;  Surgeon: Lovell Sheehan, MD;  Location: [facility];  Service: Orthopedics;  Laterality: Left;  . WRIST ARTHROSCOPY Left 1970s    There were no vitals filed for this visit.  Subjective Assessment -  01/17/19 0918    Subjective  Patient reports she has been performing her HEP and called her PCP last Friday and told her about the BP difficulties and set up an appointment this Friday to further address the issue.    Pertinent History  s/p TKA 07/06/2017; right knee arthritis with weakness as well with reports of right and left leg "giving way" intermittently; Going to have TKA 6/10    Limitations  Sitting;Walking;Standing;House hold activities;Other (comment)    How long can you sit comfortably?  forever    How long can you stand comfortably?  10    How long can you walk comfortably?  25    Diagnostic tests  imaging - see scanned docs    Patient Stated Goals  Improve motion and strength of L hip and R knee    Currently in Pain?  Yes    Pain Score  4     Pain Location  Knee    Pain Orientation  Right;Left    Pain Descriptors / Indicators  Aching    Pain Type  Chronic pain    Pain Onset  More than a month ago       TREATMENT  Therapeutic Exercise: LAQ in sitting with 10# -- 2 x 10 performed B Double LAQ with  10# with SLR at end range -- 2 x 10  Hip abduction in standing -- 2 x 10 Hip extension in standing -- 2 x 10  Monster walks with RTB around knees -- x15 (2 steps each side)  Performed exercises to help improve hip strength which her weakness along her hip musculature  is most likely contributing to knee pain.        PT Education - 01/17/19 0948    Education provided  Yes    Education Details  Continued to educate on importance of BP measurement    Person(s) Educated  Patient    Methods  Explanation;Demonstration    Comprehension  Verbalized understanding;Returned demonstration          PT Long Term Goals - 12/29/18 1000      PT LONG TERM GOAL #1   Title  Pt will be able to ambulate for 55mWT to >1 m/s to indicate improvement in functional gait and decreased fall risk    Baseline  .77 m/s    Time  6    Period  Weeks    Status  New    Target Date  02/08/19       PT LONG TERM GOAL #2   Title  Patient will improve 5xSTS to under 10sec to indicate significant improvement with functional LE strength.     Baseline  5XSTS: 21 sec    Time  6    Period  Weeks    Status  New    Target Date  02/08/19      PT LONG TERM GOAL #3   Title  Patient will have a worst pain of 3/10 to indicate significant improvement with pain and ability to perform functional activities more functionally.     Baseline  worst pain: 6/10    Time  6    Period  Weeks    Status  New    Target Date  02/08/19      PT LONG TERM GOAL #4   Title  Patient will be able to ascending/ decsend the stairs step over step with UE support to improve dynamic balance     Baseline  Able to perform step-to when acsending    Time  6    Period  Weeks    Status  New    Target Date  02/08/19            Plan - 01/17/19 0949    Clinical Impression Statement  Patient demonstrates improvement in BP this morning with a reading of 157/84mmHg and began performing exercises. Check BP intermittently throughout the session with a reading of 117/67mmHg and 150/72mmHg throughout the session. After performing one bout of exercise, patient demonstrates increased dizziness and stomach aches which she reports she has felt before. However,  vital were 131/91 and a HR: 84bpm. Stopped secondary to increased dizziness symptoms, patient sat quietly for the last 5 minutes of the symptoms and dizziness symptoms subsided. Patient will benefit from further skilled therapy to appropriate remeasurement of vitals with exercises to improve functional capability.    Clinical Impairments Affecting Rehab Potential  (+) motivated, acute condition (-) arthritis right knee with pain    PT Frequency  2x / week    PT Duration  6 weeks    PT Treatment/Interventions  Electrical Stimulation;Cryotherapy;Moist Heat;Gait training;Stair training;Therapeutic activities;Therapeutic exercise;Balance training;Patient/family  education;Neuromuscular re-education;Manual techniques    PT Next Visit Plan  exercise progression for ROM, strength    PT Home Exercise Plan  see patient education    Consulted and Agree with Plan of Care  Patient       Patient will benefit from skilled therapeutic intervention in order to improve the following deficits and impairments:  Pain, Decreased activity tolerance, Decreased endurance, Decreased range of motion, Decreased strength, Impaired perceived functional ability, Difficulty walking  Visit Diagnosis: Chronic pain of right knee  Pain in left hip  Muscle weakness (generalized)     Problem List Patient Active Problem List   Diagnosis Date Noted  . Primary osteoarthritis of right knee 09/15/2018  . Encounter for pre-operative examination 09/15/2018  . Obstructive sleep apnea 09/15/2018  . Calculus of gallbladder without cholecystitis without obstruction 09/15/2018  . Calculus of gallbladder with cholecystitis without biliary obstruction 01/27/2018  . Restless leg syndrome 01/27/2018  . Generalized abdominal pain 01/11/2018  . Cyst of pancreas 01/11/2018  . Other specified disorders of kidney and ureter 01/11/2018  . Primary insomnia 01/11/2018  . Paroxysmal atrial fibrillation ([place]) 11/12/2017  . Essential hypertension 10/25/2017  . Mixed hyperlipidemia 10/25/2017  . Low back pain at multiple sites 10/25/2017  . Dysuria 10/25/2017  . Encounter for general adult medical examination with abnormal findings 10/25/2017  . Vitamin D deficiency 10/25/2017  . Near syncope   . Symptomatic bradycardia 10/19/2017  . Carpal tunnel syndrome 10/12/2017  . Primary osteoarthritis of left knee 07/09/2017  . Total knee replacement status, left 07/06/2017  . Chest pain 06/29/2017  . Postop check 02/25/2017  . Impingement syndrome of shoulder region 01/20/2017  . Neck pain 01/20/2017  . Obesity (BMI 35.0-39.9 without comorbidity) 07/15/2016  . Endometrial polyp 07/15/2016  .  Morbid obesity ([place]) 07/15/2016  . Family history of breast cancer in first degree relative 07/15/2016  . Family history of ovarian cancer 07/15/2016  . Knee pain 07/04/2016  . Bilateral leg pain 06/24/2016    Blythe Stanford, PT DPT 01/17/2019, 9:55 AM  [facility] [address], [place], [address] Phone: [phone]   Fax:  [phone]  Name: Whitney [name] MRN: [id number] Date of Birth: April 06, 1951

## 2019-01-18 ENCOUNTER — Ambulatory Visit: Payer: Medicare Other | Admitting: Gastroenterology

## 2019-01-19 ENCOUNTER — Other Ambulatory Visit: Payer: Self-pay

## 2019-01-19 ENCOUNTER — Ambulatory Visit: Payer: Medicare Other

## 2019-01-19 DIAGNOSIS — G8929 Other chronic pain: Secondary | ICD-10-CM | POA: Diagnosis not present

## 2019-01-19 DIAGNOSIS — M6281 Muscle weakness (generalized): Secondary | ICD-10-CM | POA: Diagnosis not present

## 2019-01-19 DIAGNOSIS — M25552 Pain in left hip: Secondary | ICD-10-CM | POA: Diagnosis not present

## 2019-01-19 DIAGNOSIS — M25561 Pain in right knee: Secondary | ICD-10-CM | POA: Diagnosis not present

## 2019-01-19 NOTE — Therapy (Signed)
[facility] [address] [place], [address] Phone: [phone]   Fax:  [phone]  Physical Therapy Treatment  Patient Details  Name: Whitney [name] MRN: [id number] Date of Birth: 1950-12-27 Referring Provider (PT): Kurtis Bushman MD   Encounter Date: 01/19/2019  PT End of Session - 01/19/19 0911    Visit Number  7    Number of Visits  13    Date for PT Re-Evaluation  02/08/19    Authorization Time Period  7/10 G Code    PT Start Time  0900    PT Stop Time  0945    PT Time Calculation (min)  45 min    Activity Tolerance  Patient tolerated treatment well    Behavior During Therapy  [facility] for tasks assessed/performed       Past Medical History:  Diagnosis Date  . Abnormal Pap smear of cervix    Pt states she had colposcopy   . Arthritis   . Chronic kidney disease   . Dyspnea   . Dysrhythmia   . Hx of unilateral nephrectomy 1979   Left Nephrectomy  . Hypertension   . Lower extremity edema   . Palpitations   . PMB (postmenopausal bleeding)   . Restless leg syndrome   . Vitamin D deficiency     Past Surgical History:  Procedure Laterality Date  . HYSTEROSCOPY W/D&C N/A 02/16/2017   Procedure: DILATATION AND CURETTAGE /HYSTEROSCOPY;  Surgeon: Defrancesco, Alanda Slim, MD;  Location: [facility];  Service: Gynecology;  Laterality: N/A;  . kidney removal Left   . KIDNEY SURGERY Left 1979   left kidney removed  . LEFT HEART CATH AND CORONARY ANGIOGRAPHY N/A 10/21/2017   Procedure: LEFT HEART CATH AND CORONARY ANGIOGRAPHY;  Surgeon: Teodoro Spray, MD;  Location: [facility];  Service: Cardiovascular;  Laterality: N/A;  . TOTAL KNEE ARTHROPLASTY Left 07/06/2017   Procedure: TOTAL KNEE ARTHROPLASTY;  Surgeon: Lovell Sheehan, MD;  Location: [facility];  Service: Orthopedics;  Laterality: Left;  . WRIST ARTHROSCOPY Left 1970s    There were no vitals filed for this visit.  Subjective Assessment -  01/19/19 0904    Subjective  Patient reports she has been resting since the previous session. Patient reports her physician started her on a Beta blocker and another blood pressure medication. Patient reports she feels good today and is ready for physical therapy.    Pertinent History  s/p TKA 07/06/2017; right knee arthritis with weakness as well with reports of right and left leg "giving way" intermittently; Going to have TKA 6/10    Limitations  Sitting;Walking;Standing;House hold activities;Other (comment)    How long can you sit comfortably?  forever    How long can you stand comfortably?  10    How long can you walk comfortably?  25    Diagnostic tests  imaging - see scanned docs    Patient Stated Goals  Improve motion and strength of L hip and R knee    Currently in Pain?  No/denies    Pain Onset  More than a month ago        TREATMENT  Therapeutic Exercise: LAQ in sitting with 10# -- 2 x 10 performed B Double LAQ with 10# with SLR at end range --  x 10  Hip abduction in standing -- x 10 B Hip extension in standing -- x 10 B Standing squat with UE support - 2 x 10  Leg Press in sitting - x10 85#  BP Measurements: 153/26mmHg at beginning of session; 173/35mmHg at the end of session   Performed exercises to help improve hip strength which her weakness along her hip musculature  is most likely contributing to knee pain.      PT Education - 01/19/19 0911    Education provided  Yes    Education Details  form/technique with exercise    Person(s) Educated  Patient    Methods  Explanation;Demonstration    Comprehension  Verbalized understanding;Returned demonstration          PT Long Term Goals - 12/29/18 1000      PT LONG TERM GOAL #1   Title  Pt will be able to ambulate for 32mWT to >1 m/s to indicate improvement in functional gait and decreased fall risk    Baseline  .77 m/s    Time  6    Period  Weeks    Status  New    Target Date  02/08/19      PT LONG TERM GOAL  #2   Title  Patient will improve 5xSTS to under 10sec to indicate significant improvement with functional LE strength.     Baseline  5XSTS: 21 sec    Time  6    Period  Weeks    Status  New    Target Date  02/08/19      PT LONG TERM GOAL #3   Title  Patient will have a worst pain of 3/10 to indicate significant improvement with pain and ability to perform functional activities more functionally.     Baseline  worst pain: 6/10    Time  6    Period  Weeks    Status  New    Target Date  02/08/19      PT LONG TERM GOAL #4   Title  Patient will be able to ascending/ decsend the stairs step over step with UE support to improve dynamic balance     Baseline  Able to perform step-to when acsending    Time  6    Period  Weeks    Status  New    Target Date  02/08/19            Plan - 01/19/19 0957    Clinical Impression Statement  Patient demonstrates no signs/symptoms of cardiovascular distress throughout the session and demonstrates vitals WNL of at the beginning of the session 153/24mmHg and 173/36mmHg at the end of the session. Patient demosntrates improvement with exercises requiring less breaks throughout the session. Patient demonstrates increased quad wekaness most notably on the L LE and early onset of fatigue with exercise. Patient will benefit from further skilled therapy to return to prior level of function.    Clinical Impairments Affecting Rehab Potential  (+) motivated, acute condition (-) arthritis right knee with pain    PT Frequency  2x / week    PT Duration  6 weeks    PT Treatment/Interventions  Electrical Stimulation;Cryotherapy;Moist Heat;Gait training;Stair training;Therapeutic activities;Therapeutic exercise;Balance training;Patient/family education;Neuromuscular re-education;Manual techniques    PT Next Visit Plan  exercise progression for ROM, strength    PT Home Exercise Plan  see patient education    Consulted and Agree with Plan of Care  Patient        Patient will benefit from skilled therapeutic intervention in order to improve the following deficits and impairments:  Pain, Decreased activity tolerance, Decreased endurance, Decreased range of motion, Decreased strength, Impaired  perceived functional ability, Difficulty walking  Visit Diagnosis: 1. Chronic pain of right knee   2. Pain in left hip   3. Muscle weakness (generalized)        Problem List Patient Active Problem List   Diagnosis Date Noted  . Primary osteoarthritis of right knee 09/15/2018  . Encounter for pre-operative examination 09/15/2018  . Obstructive sleep apnea 09/15/2018  . Calculus of gallbladder without cholecystitis without obstruction 09/15/2018  . Calculus of gallbladder with cholecystitis without biliary obstruction 01/27/2018  . Restless leg syndrome 01/27/2018  . Generalized abdominal pain 01/11/2018  . Cyst of pancreas 01/11/2018  . Other specified disorders of kidney and ureter 01/11/2018  . Primary insomnia 01/11/2018  . Paroxysmal atrial fibrillation ([place]) 11/12/2017  . Essential hypertension 10/25/2017  . Mixed hyperlipidemia 10/25/2017  . Low back pain at multiple sites 10/25/2017  . Dysuria 10/25/2017  . Encounter for general adult medical examination with abnormal findings 10/25/2017  . Vitamin D deficiency 10/25/2017  . Near syncope   . Symptomatic bradycardia 10/19/2017  . Carpal tunnel syndrome 10/12/2017  . Primary osteoarthritis of left knee 07/09/2017  . Total knee replacement status, left 07/06/2017  . Chest pain 06/29/2017  . Postop check 02/25/2017  . Impingement syndrome of shoulder region 01/20/2017  . Neck pain 01/20/2017  . Obesity (BMI 35.0-39.9 without comorbidity) 07/15/2016  . Endometrial polyp 07/15/2016  . Morbid obesity ([place]) 07/15/2016  . Family history of breast cancer in first degree relative 07/15/2016  . Family history of ovarian cancer 07/15/2016  . Knee pain 07/04/2016  . Bilateral leg pain  06/24/2016    Blythe Stanford, PT DPT 01/19/2019, 10:32 AM  [facility] [address] [place], [address] Phone: [phone]   Fax:  [phone]  Name: Whitney [name] MRN: [id number] Date of Birth: 1951-01-16

## 2019-01-25 ENCOUNTER — Other Ambulatory Visit: Payer: Self-pay

## 2019-01-25 ENCOUNTER — Ambulatory Visit: Payer: Medicare Other

## 2019-01-25 DIAGNOSIS — G8929 Other chronic pain: Secondary | ICD-10-CM

## 2019-01-25 DIAGNOSIS — M6281 Muscle weakness (generalized): Secondary | ICD-10-CM

## 2019-01-25 DIAGNOSIS — M25552 Pain in left hip: Secondary | ICD-10-CM

## 2019-01-25 NOTE — Therapy (Signed)
[facility] [address], [place], [address] Phone: [phone]   Fax:  [phone]  Patient Details  Name: Whitney [name] MRN: [id number] Date of Birth: [birth year] Referring Provider:  Lovell Sheehan, MD  Encounter Date: 01/25/2019  Patient reports she is feeling "good" during today's session. However was unable to perform therapy today secondary to BP reading of 182/16mmHg, 184/90mmHg, 192/23mmHg all in sitting. Will try attempt therapy again next Thursday. Educated patient to call her MD for further instruction and to update on symptoms. Patient verbally agrees and confirms understanding.   Blythe Stanford, PT DPT 01/25/2019, 1:01 PM  [facility] [address], [place], [address] Phone: [phone]   Fax:  [phone]

## 2019-02-01 ENCOUNTER — Encounter: Payer: Self-pay | Admitting: Gastroenterology

## 2019-02-01 ENCOUNTER — Ambulatory Visit (INDEPENDENT_AMBULATORY_CARE_PROVIDER_SITE_OTHER): Payer: Medicare Other | Admitting: Gastroenterology

## 2019-02-01 ENCOUNTER — Other Ambulatory Visit: Payer: Self-pay

## 2019-02-01 ENCOUNTER — Ambulatory Visit: Payer: Medicare Other

## 2019-02-01 VITALS — BP 171/113 | HR 69 | Temp 97.6°F | Ht 65.0 in | Wt 260.4 lb

## 2019-02-01 DIAGNOSIS — R1013 Epigastric pain: Secondary | ICD-10-CM | POA: Diagnosis not present

## 2019-02-01 NOTE — Progress Notes (Signed)
[name] MD, MRCP(U.K) [address]  [place]  [place], [place] [address]  Main: [phone]  Fax: [phone]   Primary Care Physician: Ronnell Freshwater, NP  Primary Gastroenterologist:  Dr. [name]   No chief complaint on file.   HPI: Whitney [name] is a 68 y.o. female   Summary of history :  Initially seen and referred on 3/31/2020for abdominal pain.Abdominal CT scan in March 2019 also for abdominal distention nausea dizziness bradycardia. There was a small low-attenuation area within the body of the pancreas. Lobular right kidney with questionable low-attenuation area in the lower pole. Small uterine fibroids. Moderate abdominal aortic atherosclerosis and diffuse coronary artery calcifications. Multiple colonic diverticula at the recto sigmoid and descending colon. MRI of the abdomen in May 2019 which demonstrated benign liver abnormalities including multiple hemangiomas and cysts. Gallstone.'s multiple small cystic foci throughout the pancreas. Findings may represent small pseudocysts or areas of sidebranch duct ectasia. Cystic neoplasm of the pancreas not excluded follow-up imaging in 24 months recommended. 10/19/2018 HIDA scan: Normal.   Abdominal pain:Onset:2.5 years associated with passing out on 1 occasion . Pain no better after a meal Site :above belly button Radiation:localized , each episode lasts 5-10 minutes , gets around 3 episodes a week . Severity :severe Nature of pain:dull pain Aggravating factors:unclear- not related to meals. Everytime she gets the pain starts sweating Relieving factors :when she lays down , Weight loss:has lost some weight with dieting does not think the pain has made her lose weight. NSAID use:no - she used to take Motrin - stopped PPI use :no Gall bladder surgery:intact Frequency of bowel movements:almost everyday - very regular Change in bowel movements:no Relief with bowel  movements:unsure Gas/Bloating/Abdominal distension:no   Interval history   11/16/2018-02/01/2019   Has had a few episodes of abdominal pain since last visit. Overall she says that the pain episodes are more foten but does not last as long . Takes bentyl. Never had a colonoscopy or an EGD.    Current Outpatient Medications  Medication Sig Dispense Refill  . atorvastatin (LIPITOR) 40 MG tablet Take 1 tablet (40 mg total) by mouth daily at 6 PM. 90 tablet 1  . carvedilol (COREG) 3.125 MG tablet Take 2 tablets (6.25 mg total) by mouth 2 (two) times daily with a meal. (Patient not taking: Reported on 12/24/2018) 120 tablet 3  . carvedilol (COREG) 6.25 MG tablet Take 6.25 mg by mouth 2 (two) times a day.    . diclofenac sodium (VOLTAREN) 1 % GEL Apply 1 application topically 4 (four) times daily as needed (pain).   0  . dicyclomine (BENTYL) 10 MG capsule Take 1 capsule (10 mg total) by mouth 4 (four) times daily -  before meals and at bedtime. As needed (Patient taking differently: Take 10 mg by mouth 2 (two) times a day. ) 120 capsule 2  . famotidine (PEPCID) 40 MG tablet Take 1 tablet (40 mg total) by mouth daily. 90 tablet 1  . flecainide (TAMBOCOR) 150 MG tablet Take 150 mg by mouth 2 (two) times daily.    Marland Kitchen gabapentin (NEURONTIN) 100 MG capsule Take 1 capsule (100 mg total) by mouth 2 (two) times daily. (Patient taking differently: Take 100 mg by mouth 2 (two) times daily as needed (pain). ) 90 capsule 0  . hydrALAZINE (APRESOLINE) 10 MG tablet Take 1 tablet (10 mg total) by mouth 3 (three) times daily. 270 tablet 1  . rOPINIRole (REQUIP) 0.5 MG tablet Takes 1 or  2 tablets daily as needed (Patient taking differently: Take 0.5 mg by mouth 2 (two) times daily as needed (restless legs). ) 90 tablet 1  . traMADol (ULTRAM) 50 MG tablet Take 1 tablet (50 mg total) by mouth every 6 (six) hours as needed. (Patient taking differently: Take 50 mg by mouth every 6 (six) hours as needed for moderate pain.  ) 20 tablet 0  . traZODone (DESYREL) 50 MG tablet Take 1 tablet (50 mg total) by mouth at bedtime as needed for sleep. 30 tablet 3   No current facility-administered medications for this visit.     Allergies as of 02/01/2019 - Review Complete 01/25/2019  Allergen Reaction Noted  . Enalapril Swelling   . Lisinopril Swelling 05/26/2016    ROS:  General: Negative for anorexia, weight loss, fever, chills, fatigue, weakness. ENT: Negative for hoarseness, difficulty swallowing , nasal congestion. CV: Negative for chest pain, angina, palpitations, dyspnea on exertion, peripheral edema.  Respiratory: Negative for dyspnea at rest, dyspnea on exertion, cough, sputum, wheezing.  GI: See history of present illness. GU:  Negative for dysuria, hematuria, urinary incontinence, urinary frequency, nocturnal urination.  Endo: Negative for unusual weight change.    Physical Examination:   LMP 01/26/2017 Comment: age 12  General: Well-nourished, well-developed in no acute distress.  Eyes: No icterus. Conjunctivae pink. Mouth: Oropharyngeal mucosa moist and pink , no lesions erythema or exudate. Lungs: Clear to auscultation bilaterally. Non-labored. Heart: Regular rate and rhythm, no murmurs rubs or gallops.  Abdomen: Bowel sounds are normal, nontender, nondistended, no hepatosplenomegaly or masses, no abdominal bruits or hernia , no rebound or guarding.   Extremities: No lower extremity edema. No clubbing or deformities. Neuro: Alert and oriented x 3.  Grossly intact. Skin: Warm and dry, no jaundice.   Psych: Alert and cooperative, normal mood and affect.   Imaging Studies: No results found.  Assessment and Plan:   Whitney [name] is a 68 y.o. y/o femalehere to follow up for abdominal pain. Appears to be longstanding. Previous abdominal imaging showed abnormalities of the pancreas and per radiology report recommended repeating scan in 2 years. HIDA normal .Her symptoms presently could be from  functional dyspepsia. She does give some history of passing out with the pain ?vagal .Overall feeling better on PPI and bentyl but not resolved, never had a colonoscopy   Plan :  1. I will refer this patient to obtain an EUS in a few weeks after the pandemic to evaluate the cystic lesions of the pancreas. 2.H pylori breath test 3.Bentyl PRN upto TID 20 mg and IB guard PRN samples provided 5. EGD+ colonoscopy   I have discussed alternative options, risks & benefits,  which include, but are not limited to, bleeding, infection, perforation,respiratory complication & drug reaction.  The patient agrees with this plan & written consent will be obtained.     Dr [name]  MD,MRCP [facility]) Follow up in 4-6 weeks

## 2019-02-02 ENCOUNTER — Other Ambulatory Visit: Payer: Self-pay

## 2019-02-02 DIAGNOSIS — R935 Abnormal findings on diagnostic imaging of other abdominal regions, including retroperitoneum: Secondary | ICD-10-CM

## 2019-02-03 ENCOUNTER — Other Ambulatory Visit: Payer: Self-pay

## 2019-02-03 ENCOUNTER — Ambulatory Visit: Payer: Medicare Other

## 2019-02-03 DIAGNOSIS — M25561 Pain in right knee: Secondary | ICD-10-CM | POA: Insufficient documentation

## 2019-02-03 DIAGNOSIS — G8929 Other chronic pain: Secondary | ICD-10-CM | POA: Insufficient documentation

## 2019-02-03 DIAGNOSIS — M25552 Pain in left hip: Secondary | ICD-10-CM | POA: Insufficient documentation

## 2019-02-07 ENCOUNTER — Ambulatory Visit: Payer: Medicare Other | Attending: Orthopedic Surgery

## 2019-02-07 ENCOUNTER — Telehealth: Payer: Self-pay | Admitting: Gastroenterology

## 2019-02-07 NOTE — Therapy (Signed)
[facility] [address] [place], [address] Phone: [phone]   Fax:  [phone]  Patient Details  Name: Whitney [name] MRN: [id number] Date of Birth: Jun 26, 1951 Referring Provider:  Lovell Sheehan, MD  Encounter Date: 02/03/2019  Unable to perform therapy today secondary to HTN of 180/157mmHg. Educated patient to go to the ED and she verbally confirms understanding. Called cardiologist with patient who recommended the same course of action.   Blythe Stanford, PT DPT 02/07/2019, 12:53 PM  [facility] [address], [place], [address] Phone: [phone]   Fax:  [phone]

## 2019-02-07 NOTE — Telephone Encounter (Signed)
There is a referal to Dr. Rush Landmark for EUS to evaluate the cystic lesions of the pancreas.

## 2019-02-07 NOTE — Telephone Encounter (Signed)
Whitney [name], thank you for reaching out. Dr. Vicente Males, thanks for reaching out to [place] as well with this referral. I had a chance to review your recent note. I have had a chance to review the previous CT and then the MRI/CP from May 2019. In the setting of 1-year worth of time, prior to EUS, it would be ideal to see if these lesions remain. If these have characteristic appearance on repeat imaging, then an EUS, may not be indicated at this time. I would begin with repeating MRI/MRCP. If you have other issues or concerns in regards to the possibilities of chronic pancreatitis playing a role or to get a better understanding of the parenchyma we could pursue an EUS. However, if necessary and to help direct possible sampling (though at last evaluation these lesions are too small for typical FNA), the MRI/MRCP would be ideal to see what has changed in the year. Are you OK with beginning with this Dr. Vicente Males?  Whitney Britain, MD

## 2019-02-08 ENCOUNTER — Telehealth: Payer: Self-pay | Admitting: Gastroenterology

## 2019-02-08 NOTE — Telephone Encounter (Signed)
Patient called & l/m on v/m stating she had lost papers & would liked them mailed to her. She will take them to [facility] [address] for the labwork.

## 2019-02-08 NOTE — Telephone Encounter (Signed)
Spoke with pt and informed her that she does not need the printed lab order as [facility] will be able to access the order electronically. Pt understands.

## 2019-02-09 ENCOUNTER — Ambulatory Visit: Payer: Medicare Other

## 2019-02-10 ENCOUNTER — Ambulatory Visit: Payer: Medicare Other | Admitting: Internal Medicine

## 2019-02-14 ENCOUNTER — Ambulatory Visit: Payer: Medicare Other

## 2019-02-14 ENCOUNTER — Encounter: Payer: Self-pay | Admitting: Internal Medicine

## 2019-02-14 ENCOUNTER — Other Ambulatory Visit: Payer: Self-pay

## 2019-02-14 ENCOUNTER — Ambulatory Visit (INDEPENDENT_AMBULATORY_CARE_PROVIDER_SITE_OTHER): Payer: Medicare Other | Admitting: Internal Medicine

## 2019-02-14 VITALS — BP 158/108 | HR 89 | Resp 16 | Ht 65.0 in | Wt 258.0 lb

## 2019-02-14 DIAGNOSIS — I10 Essential (primary) hypertension: Secondary | ICD-10-CM

## 2019-02-14 DIAGNOSIS — G2581 Restless legs syndrome: Secondary | ICD-10-CM

## 2019-02-14 DIAGNOSIS — R0602 Shortness of breath: Secondary | ICD-10-CM | POA: Diagnosis not present

## 2019-02-14 DIAGNOSIS — G4733 Obstructive sleep apnea (adult) (pediatric): Secondary | ICD-10-CM

## 2019-02-14 DIAGNOSIS — Z789 Other specified health status: Secondary | ICD-10-CM

## 2019-02-14 DIAGNOSIS — R933 Abnormal findings on diagnostic imaging of other parts of digestive tract: Secondary | ICD-10-CM

## 2019-02-14 NOTE — Telephone Encounter (Signed)
[place]   Inform patient- as discussed with Dr Rush Landmark- get MRI abdomen to evaluate pancreas since been a year since last imaging. Based on result will decide if EUS is warranted or not at this time  C/c Dr Rush Landmark , Ronnell Freshwater, NP   Dr [name] MD,MRCP [facility]) Gastroenterology/Hepatology Pager: [phone]

## 2019-02-14 NOTE — Telephone Encounter (Signed)
I'll be on the look out for the MRI. Thank you. GM

## 2019-02-14 NOTE — Progress Notes (Signed)
[facility] [place], [place] [address]  Pulmonary Sleep Medicine   Office Visit Note  Patient Name: Whitney [name] DOB: April 25, 1951 MRN [id number]  Date of Service: 02/14/2019  Complaints/HPI: Pt is here for pulmonary clearance for Upper gi/colonoscopy. She also has not had her titration study done.  She had a bad experience in our lab last time and prefers to do her titration study at the hospital. Given her history we discussed that she would need PFT's before surgical cleanse could be given.      ROS  General: (-) fever, (-) chills, (-) night sweats, (-) weakness Skin: (-) rashes, (-) itching,. Eyes: (-) visual changes, (-) redness, (-) itching. Nose and Sinuses: (-) nasal stuffiness or itchiness, (-) postnasal drip, (-) nosebleeds, (-) sinus trouble. Mouth and Throat: (-) sore throat, (-) hoarseness. Neck: (-) swollen glands, (-) enlarged thyroid, (-) neck pain. Respiratory: - cough, (-) bloody sputum, - shortness of breath, - wheezing. Cardiovascular: - ankle swelling, (-) chest pain. Lymphatic: (-) lymph node enlargement. Neurologic: (-) numbness, (-) tingling. Psychiatric: (-) anxiety, (-) depression   Current Medication: Outpatient Encounter Medications as of 02/14/2019  Medication Sig  . atorvastatin (LIPITOR) 40 MG tablet Take 1 tablet (40 mg total) by mouth daily at 6 PM.  . carvedilol (COREG) 3.125 MG tablet Take 2 tablets (6.25 mg total) by mouth 2 (two) times daily with a meal.  . carvedilol (COREG) 6.25 MG tablet Take 6.25 mg by mouth 2 (two) times a day.  . diclofenac sodium (VOLTAREN) 1 % GEL Apply 1 application topically 4 (four) times daily as needed (pain).   Marland Kitchen dicyclomine (BENTYL) 10 MG capsule Take 1 capsule (10 mg total) by mouth 4 (four) times daily -  before meals and at bedtime. As needed (Patient taking differently: Take 10 mg by mouth 2 (two) times a day. )  . famotidine (PEPCID) 40 MG tablet Take 1 tablet (40 mg total) by  mouth daily.  . flecainide (TAMBOCOR) 150 MG tablet Take 150 mg by mouth 2 (two) times daily.  Marland Kitchen gabapentin (NEURONTIN) 100 MG capsule Take 1 capsule (100 mg total) by mouth 2 (two) times daily. (Patient taking differently: Take 100 mg by mouth 2 (two) times daily as needed (pain). )  . hydrALAZINE (APRESOLINE) 10 MG tablet Take 1 tablet (10 mg total) by mouth 3 (three) times daily.  Marland Kitchen rOPINIRole (REQUIP) 0.5 MG tablet Takes 1 or 2 tablets daily as needed (Patient taking differently: Take 0.5 mg by mouth 2 (two) times daily as needed (restless legs). )  . traMADol (ULTRAM) 50 MG tablet Take 1 tablet (50 mg total) by mouth every 6 (six) hours as needed. (Patient taking differently: Take 50 mg by mouth every 6 (six) hours as needed for moderate pain. )  . traZODone (DESYREL) 50 MG tablet Take 1 tablet (50 mg total) by mouth at bedtime as needed for sleep.   No facility-administered encounter medications on file as of 02/14/2019.     Surgical History: Past Surgical History:  Procedure Laterality Date  . HYSTEROSCOPY W/D&C N/A 02/16/2017   Procedure: DILATATION AND CURETTAGE /HYSTEROSCOPY;  Surgeon: Defrancesco, Alanda Slim, MD;  Location: [facility] ORS;  Service: Gynecology;  Laterality: N/A;  . kidney removal Left   . KIDNEY SURGERY Left 1979   left kidney removed  . LEFT HEART CATH AND CORONARY ANGIOGRAPHY N/A 10/21/2017   Procedure: LEFT HEART CATH AND CORONARY ANGIOGRAPHY;  Surgeon: Teodoro Spray, MD;  Location: [facility];  Service: Cardiovascular;  Laterality: N/A;  . TOTAL KNEE ARTHROPLASTY Left 07/06/2017   Procedure: TOTAL KNEE ARTHROPLASTY;  Surgeon: Lovell Sheehan, MD;  Location: [facility] ORS;  Service: Orthopedics;  Laterality: Left;  . WRIST ARTHROSCOPY Left 1970s    Medical History: Past Medical History:  Diagnosis Date  . Abnormal Pap smear of cervix    Pt states she had colposcopy   . Arthritis   . Chronic kidney disease   . Dyspnea   . Dysrhythmia   . Hx of unilateral  nephrectomy 1979   Left Nephrectomy  . Hypertension   . Lower extremity edema   . Palpitations   . PMB (postmenopausal bleeding)   . Restless leg syndrome   . Vitamin D deficiency     Family History: Family History  Problem Relation Age of Onset  . Ovarian cancer Mother   . Hypertension Father   . Diabetes Father   . Cancer Father   . Breast cancer Sister   . Diabetes Sister     Social History: Social History   Socioeconomic History  . Marital status: Divorced    Spouse name: Not on file  . Number of children: Not on file  . Years of education: Not on file  . Highest education level: Not on file  Occupational History  . Not on file  Social Needs  . Financial resource strain: Not on file  . Food insecurity    Worry: Not on file    Inability: Not on file  . Transportation needs    Medical: Not on file    Non-medical: Not on file  Tobacco Use  . Smoking status: Never Smoker  . Smokeless tobacco: Never Used  Substance and Sexual Activity  . Alcohol use: Yes    Alcohol/week: 2.0 standard drinks    Types: 2 Standard drinks or equivalent per week    Comment: occasionally  . Drug use: Yes    Frequency: 3.0 times per week    Types: Marijuana    Comment: Pt states she uses for leg pain: marijuana; states no longer uses cocaine  . Sexual activity: Yes    Birth control/protection: None  Lifestyle  . Physical activity    Days per week: Not on file    Minutes per session: Not on file  . Stress: Not on file  Relationships  . Social Herbalist on phone: Not on file    Gets together: Not on file    Attends religious service: Not on file    Active member of club or organization: Not on file    Attends meetings of clubs or organizations: Not on file    Relationship status: Not on file  . Intimate partner violence    Fear of current or ex partner: Not on file    Emotionally abused: Not on file    Physically abused: Not on file    Forced sexual activity:  Not on file  Other Topics Concern  . Not on file  Social History Narrative  . Not on file    Vital Signs: Blood pressure (!) 158/108, [name] 89, resp. rate 16, height 5\' 5"  (1.651 m), weight 258 lb (117 kg), last menstrual period 01/26/2017, SpO2 98 %.  Examination: General Appearance: The patient is well-developed, well-nourished, and in no distress. Skin: Gross inspection of skin unremarkable. Head: normocephalic, no gross deformities. Eyes: no gross deformities noted. ENT: ears appear grossly normal no exudates. Neck: Supple. No thyromegaly.  No LAD. Respiratory: clear bilateraly. Cardiovascular: Normal S1 and S2 without murmur or rub. Extremities: No cyanosis. pulses are equal. Neurologic: Alert and oriented. No involuntary movements.  LABS: Recent Results (from the past 2160 hour(s))  APTT     Status: None   Collection Time: 01/03/19  8:50 AM  Result Value Ref Range   aPTT 28 24 - 36 seconds    Comment: Performed at [facility], [address] [place], [place] [address]  Basic metabolic panel     Status: Abnormal   Collection Time: 01/03/19  8:50 AM  Result Value Ref Range   Sodium 140 135 - 145 mmol/L   Potassium 4.1 3.5 - 5.1 mmol/L   Chloride 108 98 - 111 mmol/L   CO2 26 22 - 32 mmol/L   Glucose, Bld 92 70 - 99 mg/dL   BUN 22 8 - 23 mg/dL   Creatinine, Ser 1.11 (H) 0.44 - 1.00 mg/dL   Calcium 8.4 (L) 8.9 - 10.3 mg/dL   GFR calc non Af Amer 51 (L) >60 mL/min   GFR calc Af Amer 59 (L) >60 mL/min   Anion gap 6 5 - 15    Comment: Performed at [facility], [address] [place], [place] [address]  CBC     Status: None   Collection Time: 01/03/19  8:50 AM  Result Value Ref Range   WBC 5.5 4.0 - 10.5 K/uL   RBC 4.49 3.87 - 5.11 MIL/uL   Hemoglobin 13.3 12.0 - 15.0 g/dL   HCT 41.9 36.0 - 46.0 %   MCV 93.3 80.0 - 100.0 fL   MCH 29.6 26.0 - 34.0 pg   MCHC 31.7 30.0 - 36.0 g/dL   RDW 12.8 11.5 - 15.5 %   Platelets 243 150 - 400 K/uL   nRBC  0.0 0.0 - 0.2 %    Comment: Performed at [facility], [address] [place], [place] [address]  Protime-INR     Status: None   Collection Time: 01/03/19  8:50 AM  Result Value Ref Range   Prothrombin Time 11.7 11.4 - 15.2 seconds   INR 0.9 0.8 - 1.2    Comment: (NOTE) INR goal varies based on device and disease states. Performed at [facility], [address] [place], [place] [address]   Urinalysis, Routine w reflex microscopic     Status: Abnormal   Collection Time: 01/03/19  8:50 AM  Result Value Ref Range   Color, Urine YELLOW (A) YELLOW   APPearance HAZY (A) CLEAR   Specific Gravity, Urine 1.014 1.005 - 1.030   pH 6.0 5.0 - 8.0   Glucose, UA NEGATIVE NEGATIVE mg/dL   Hgb urine dipstick NEGATIVE NEGATIVE   Bilirubin Urine NEGATIVE NEGATIVE   Ketones, ur NEGATIVE NEGATIVE mg/dL   Protein, ur 100 (A) NEGATIVE mg/dL   Nitrite NEGATIVE NEGATIVE   Leukocytes,Ua NEGATIVE NEGATIVE   RBC / HPF 0-5 0 - 5 RBC/hpf   WBC, UA 0-5 0 - 5 WBC/hpf   Bacteria, UA RARE (A) NONE SEEN   Squamous Epithelial / LPF 0-5 0 - 5   Mucus PRESENT     Comment: Performed at [facility], [address], [place], [place] [address]  Surgical pcr screen     Status: None   Collection Time: 01/03/19  8:50 AM   Specimen: Nasal Mucosa; Nasal Swab  Result Value Ref Range   MRSA, PCR NEGATIVE NEGATIVE   Staphylococcus aureus NEGATIVE NEGATIVE  Comment: (NOTE) The Xpert SA Assay (FDA approved for NASAL specimens in patients 18 years of age and older), is one component of a comprehensive surveillance program. It is not intended to diagnose infection nor to guide or monitor treatment. Performed at [facility], [address] [place], [place] [address]   Type and screen [place]     Status: None   Collection Time: 01/03/19  8:50 AM  Result Value Ref Range   ABO/RH(D) B POS    Antibody Screen NEG    Sample Expiration 01/17/2019,2359     Extend sample reason      NO TRANSFUSIONS OR PREGNANCY IN THE PAST 3 MONTHS Performed at [facility], [address] [place], [place] [address]     Radiology: No results found.  No results found.  No results found.    Assessment and Plan: Patient Active Problem List   Diagnosis Date Noted  . Primary osteoarthritis of right knee 09/15/2018  . Encounter for pre-operative examination 09/15/2018  . Obstructive sleep apnea 09/15/2018  . Calculus of gallbladder without cholecystitis without obstruction 09/15/2018  . Calculus of gallbladder with cholecystitis without biliary obstruction 01/27/2018  . Restless leg syndrome 01/27/2018  . Generalized abdominal pain 01/11/2018  . Cyst of pancreas 01/11/2018  . Other specified disorders of kidney and ureter 01/11/2018  . Primary insomnia 01/11/2018  . Paroxysmal atrial fibrillation ([place]) 11/12/2017  . Essential hypertension 10/25/2017  . Mixed hyperlipidemia 10/25/2017  . Low back pain at multiple sites 10/25/2017  . Dysuria 10/25/2017  . Encounter for general adult medical examination with abnormal findings 10/25/2017  . Vitamin D deficiency 10/25/2017  . Near syncope   . Symptomatic bradycardia 10/19/2017  . Carpal tunnel syndrome 10/12/2017  . Primary osteoarthritis of left knee 07/09/2017  . Total knee replacement status, left 07/06/2017  . Chest pain 06/29/2017  . Postop check 02/25/2017  . Impingement syndrome of shoulder region 01/20/2017  . Neck pain 01/20/2017  . Obesity (BMI 35.0-39.9 without comorbidity) 07/15/2016  . Endometrial polyp 07/15/2016  . Morbid obesity ([place]) 07/15/2016  . Family history of breast cancer in first degree relative 07/15/2016  . Family history of ovarian cancer 07/15/2016  . Knee pain 07/04/2016  . Bilateral leg pain 06/24/2016    1. OSA (obstructive sleep apnea) [place], due to covid, pt still has not completed testing.  - Cpap titration; Future  2. Essential  hypertension BP elevated today.  Pt reports she has not taken her medication today, and she was upset before coming to office.  Encouraged her to take her medications as directed.   3. Restless leg syndrome Stable, continue present management.  4. Morbid obesity ([place]) Obesity Counseling: Risk Assessment: An assessment of behavioral risk factors was made today and includes lack of exercise sedentary lifestyle, lack of portion control and poor dietary habits.  Risk Modification Advice: She was counseled on portion control guidelines. Restricting daily caloric intake to. . The detrimental long term effects of obesity on her health and ongoing poor compliance was also discussed with the patient.  5. SOB (shortness of breath) - Spirometry with Graph  6. Baseline forced expiratory volume at end of one second (FEV1) less than 30% predicted - Pulmonary Function Test; Future  General Counseling: I have discussed the findings of the evaluation and examination with Whitney [name].  I have also discussed any further diagnostic evaluation thatmay be needed or ordered today. Whitney [name] verbalizes understanding of the findings of todays visit. We also reviewed  her medications today and discussed drug interactions and side effects including but not limited excessive drowsiness and altered mental states. We also discussed that there is always a risk not just to her but also people around her. she has been encouraged to call the office with any questions or concerns that should arise related to todays visit.    Time spent: 25 This patient was seen by Orson Gear AGNP-C in Collaboration with Dr. Devona Konig as a part of collaborative care agreement.   I have personally obtained a history, examined the patient, evaluated laboratory and imaging results, formulated the assessment and plan and placed orders.    Allyne Gee, MD [facility] Pulmonary and Critical Care Sleep medicine

## 2019-02-15 NOTE — Telephone Encounter (Signed)
Spoke with pt and informed her that she'll need a repeat MRCP prior to the EUS procedure, pt agrees. The MRCP has been scheduled.

## 2019-02-16 ENCOUNTER — Ambulatory Visit: Payer: Medicare Other

## 2019-02-21 ENCOUNTER — Encounter: Payer: Self-pay | Admitting: Gastroenterology

## 2019-02-21 ENCOUNTER — Ambulatory Visit: Payer: Medicare Other

## 2019-02-21 LAB — H. PYLORI BREATH TEST: H pylori Breath Test: NEGATIVE

## 2019-02-23 ENCOUNTER — Ambulatory Visit: Payer: Medicare Other

## 2019-02-23 ENCOUNTER — Other Ambulatory Visit: Payer: Self-pay

## 2019-02-23 ENCOUNTER — Telehealth: Payer: Self-pay | Admitting: Gastroenterology

## 2019-02-23 ENCOUNTER — Ambulatory Visit (INDEPENDENT_AMBULATORY_CARE_PROVIDER_SITE_OTHER): Payer: Medicare Other | Admitting: Internal Medicine

## 2019-02-23 DIAGNOSIS — R0602 Shortness of breath: Secondary | ICD-10-CM | POA: Diagnosis not present

## 2019-02-23 LAB — PULMONARY FUNCTION TEST

## 2019-02-23 NOTE — Telephone Encounter (Signed)
Pt called back to find out the address for her Mri and the time for her transportation please call pt

## 2019-02-23 NOTE — Telephone Encounter (Signed)
Pt left vm to find out information about her MRI

## 2019-02-24 NOTE — Telephone Encounter (Signed)
Pt is returning a call for nurse

## 2019-02-24 NOTE — Telephone Encounter (Signed)
Unable to speak with pt to remind her of MRI appointment location and time. Left a detailed message.

## 2019-03-04 ENCOUNTER — Ambulatory Visit
Admission: RE | Admit: 2019-03-04 | Discharge: 2019-03-04 | Disposition: A | Discharge status: Home / Self Care | Payer: Medicare Other | Source: Ambulatory Visit | Attending: Gastroenterology | Admitting: Gastroenterology

## 2019-03-04 ENCOUNTER — Other Ambulatory Visit: Payer: Self-pay

## 2019-03-04 DIAGNOSIS — R933 Abnormal findings on diagnostic imaging of other parts of digestive tract: Secondary | ICD-10-CM | POA: Insufficient documentation

## 2019-03-04 LAB — POCT I-STAT CREATININE: Creatinine, Ser: 1.3 mg/dL — ABNORMAL HIGH (ref 0.44–1.00)

## 2019-03-04 MED ORDER — GADOBUTROL 1 MMOL/ML IV SOLN
10.0000 mL | Freq: Once | INTRAVENOUS | Status: AC | PRN
  Administered 2019-03-04: 10 mL via INTRAVENOUS

## 2019-03-06 IMAGING — MR MR HEAD W/O CM
8 of 10 series · 31 of 48 positions shown · non-contrast
Comparison: Brain MRI 04/18/2016

CLINICAL DATA: Syncope and nausea.

EXAM:
MRI HEAD WITHOUT CONTRAST
TECHNIQUE: Multiplanar, multiecho pulse sequences of the brain and surrounding
structures were obtained without intravenous contrast.

[Series 3: DWI · axial · 4.0mm · 0.94mm/px · z∈[-58,+101]mm · 4 of 41 slices shown (1 of 2)]
[im 1/41]
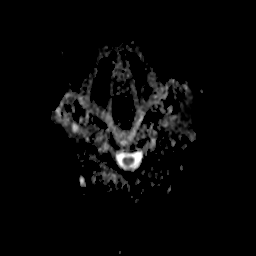
[im 14/41]
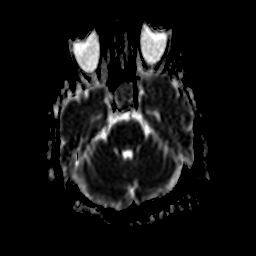
[im 27/41]
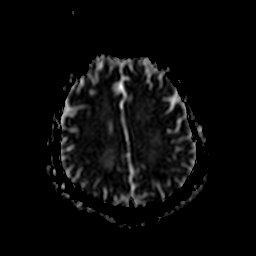
[im 41/41]
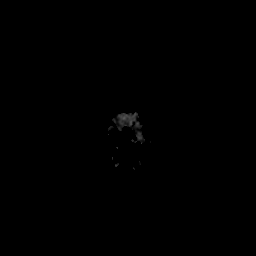

[Series 5: DWI · coronal · 5.0mm · 1.80mm/px · 5 of 39 slices shown (2 of 2)]
[im 1/39]
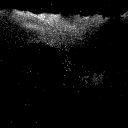
[im 10/39]
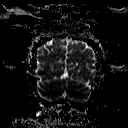
[im 20/39]
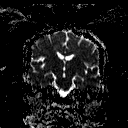
[im 29/39]
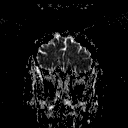
[im 39/39]
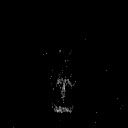

[Series 7: T1 · sagittal · 5.0mm · 0.45mm/px · 4 of 29 slices shown (1 of 2)]
[im 1/29]
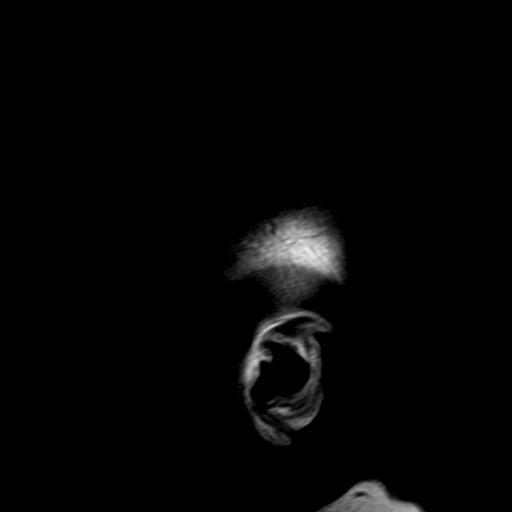
[im 10/29]
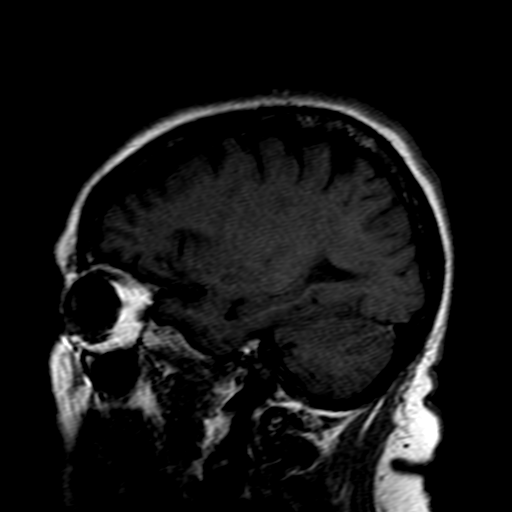
[im 19/29]
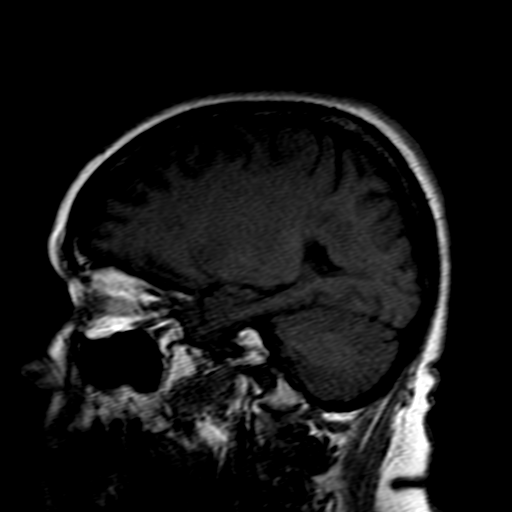
[im 29/29]
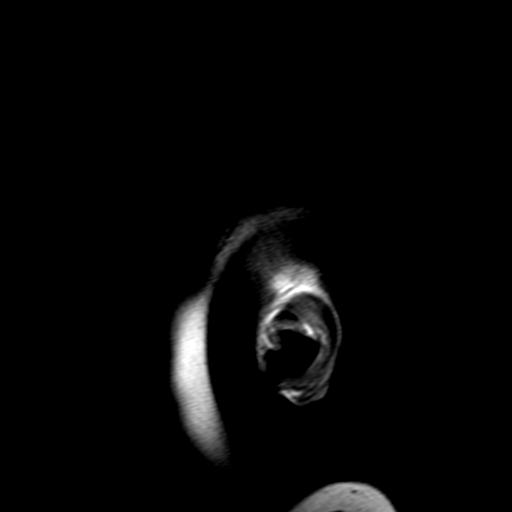

[Series 9: T2 · axial · 5.0mm · 0.45mm/px · z∈[-57,+98]mm · 3 of 25 slices shown (1 of 3)]
[im 1/25]
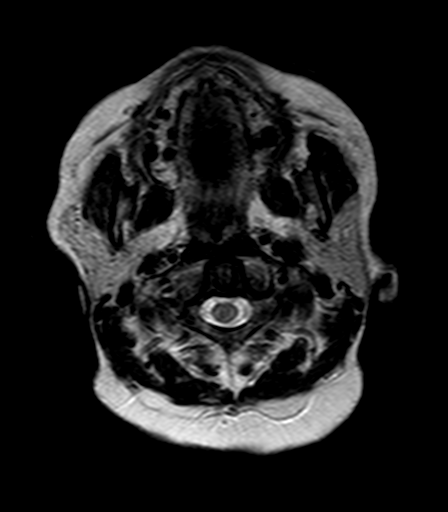
[im 13/25]
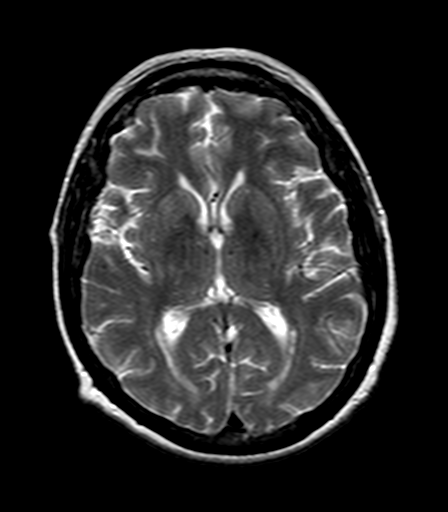
[im 25/25]
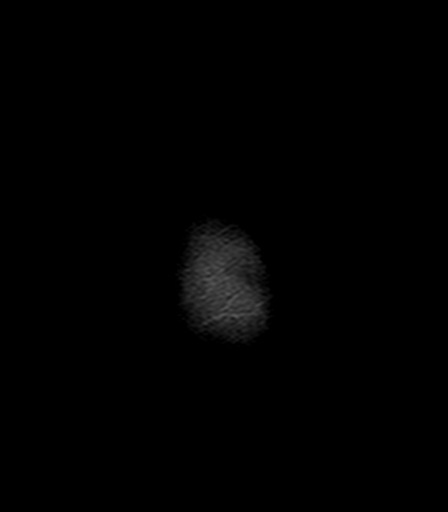

[Series 10: FLAIR · axial · 5.0mm · 0.90mm/px · z∈[-58,+97]mm · 3 of 25 slices shown]
[im 1/25]
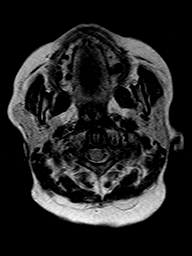
[im 13/25]
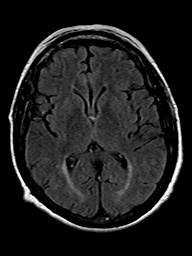
[im 25/25]
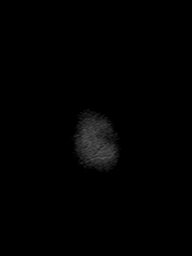

[Series 11: T2 · axial · 5.0mm · 0.45mm/px · z∈[-57,+98]mm · 3 of 25 slices shown (2 of 3)]
[im 1/25]
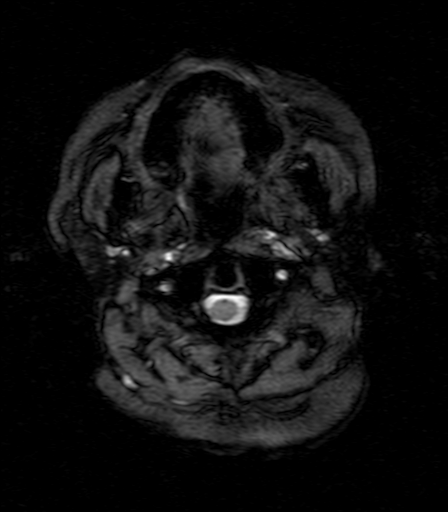
[im 13/25]
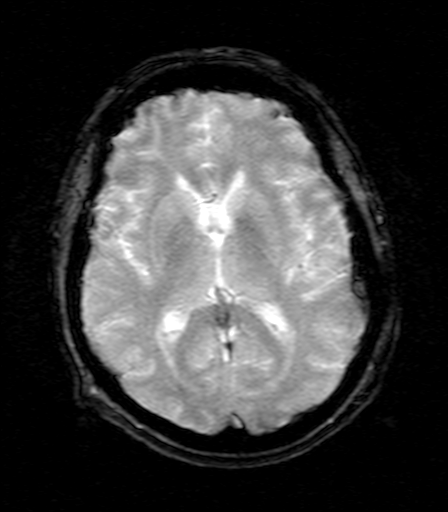
[im 25/25]
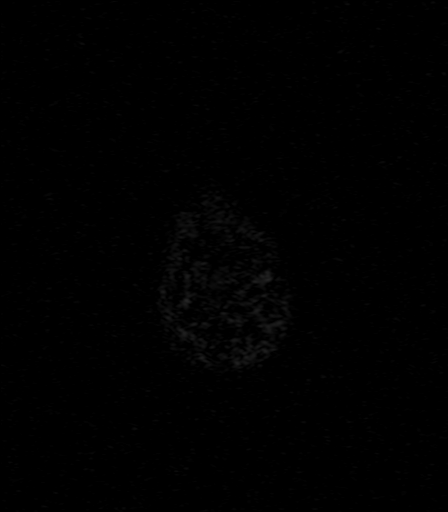

[Series 12: T1 · axial · 3.0mm · 0.45mm/px · z∈[-62,+45]mm · 5 of 56 slices shown (2 of 2)]
[im 1/56]
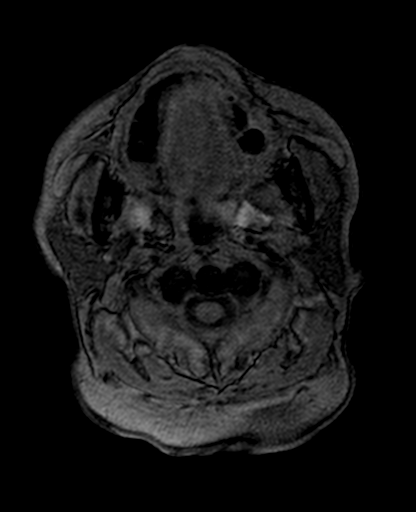
[im 10/56]
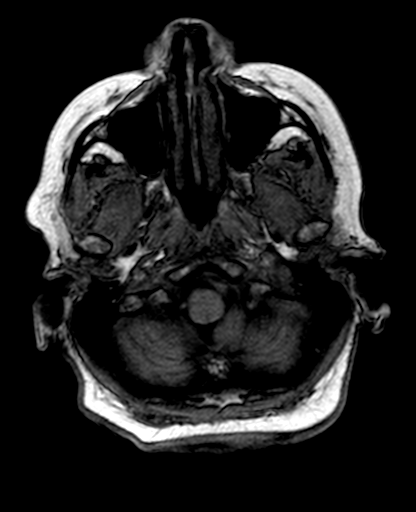
[im 19/56]
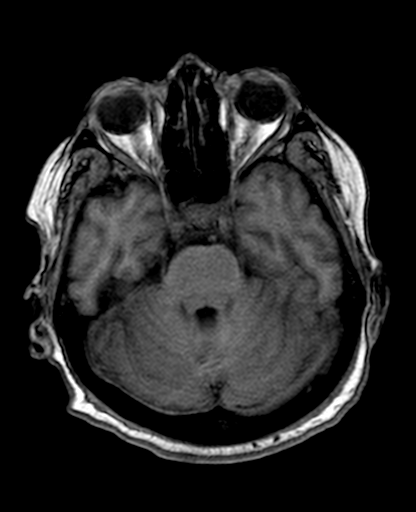
[im 28/56]
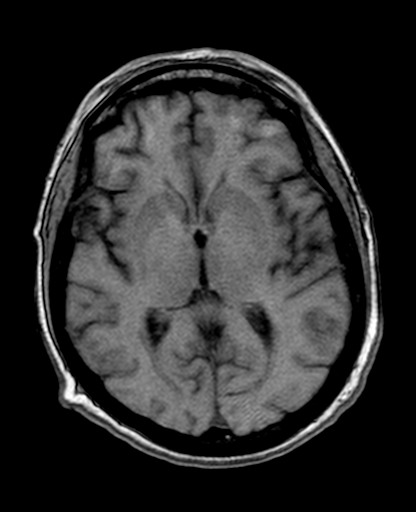
[im 37/56]
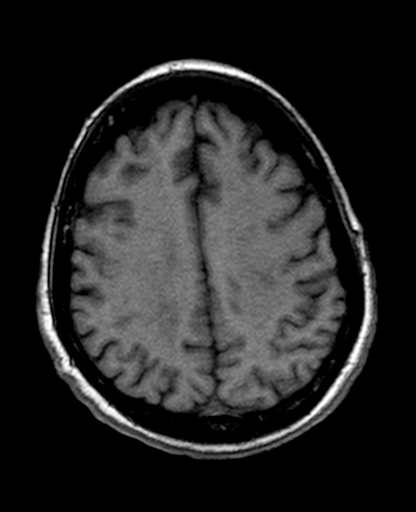

[Series 13: T2 · coronal · 5.0mm · 0.45mm/px · 4 of 29 slices shown (3 of 3)]
[im 1/29]
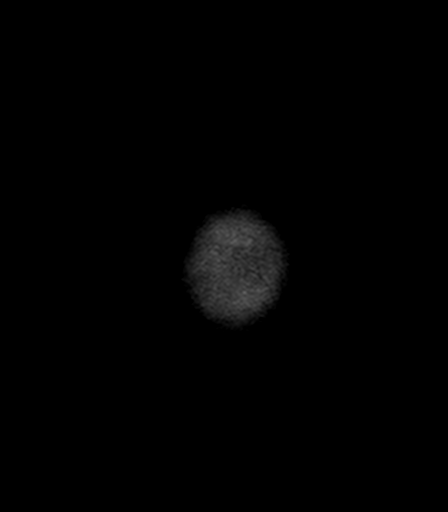
[im 10/29]
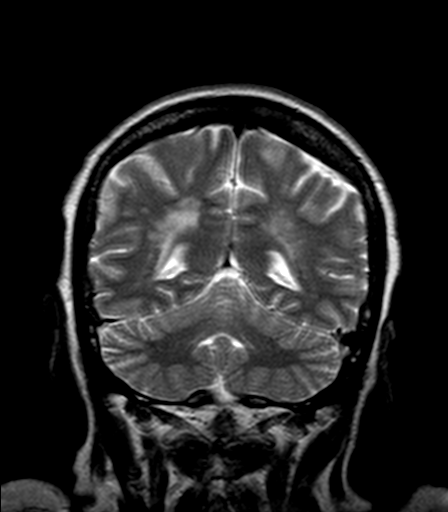
[im 19/29]
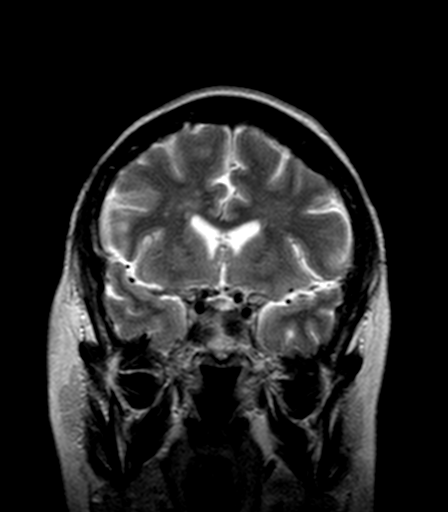
[im 29/29]
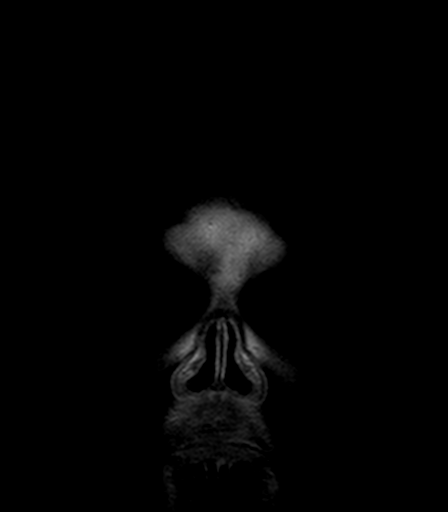

[31 of 48 positions shown; findings below may reference images not displayed]

FINDINGS: Brain: The midline structures are normal. There is no acute infarct
or acute hemorrhage. No mass lesion, hydrocephalus, dural
abnormality or extra-axial collection. Multifocal white matter
hyperintensity, most commonly due to chronic ischemic
microangiopathy. No age-advanced or lobar predominant atrophy. No
chronic microhemorrhage or superficial siderosis.

Vascular: Major intracranial arterial and venous sinus flow voids
are preserved.

Skull and upper cervical spine: The visualized skull base,
calvarium, upper cervical spine and extracranial soft tissues are
normal.

Sinuses/Orbits: No fluid levels or advanced mucosal thickening. No
mastoid or middle ear effusion. Normal orbits.
IMPRESSION: Chronic microvascular disease without acute intracranial
abnormality.

## 2019-03-06 NOTE — Procedures (Signed)
[facility] [address] [place], [address]  DATE OF SERVICE: 02/23/2019  Complete Pulmonary Function Testing Interpretation:  FINDINGS:  Forced vital capacity is normal.  The FEV1 is normal.  FEV1 FVC ratio is normal.  Postbronchodilator there is no significant change in the FEV1.  Total lung capacity is moderately decreased.  Residual volume is severely decreased.  Residual volume total lung capacity ratio is decreased.  Thoracic gas volume is decreased.  DLCO is normal.  IMPRESSION:  This pulmonary function study is consistent with moderate restrictive lung disease  Allyne Gee, MD [facility] Pulmonary Critical Care Medicine Sleep Medicine

## 2019-03-07 ENCOUNTER — Telehealth: Payer: Self-pay

## 2019-03-07 NOTE — Telephone Encounter (Signed)
Spoke with pt and informed her of MRI results and Dr. Georgeann Oppenheim suggestions. Pt states she'd like to discuss this further during her scheduled follow up visit with Dr. Vicente Males before we send the referral.

## 2019-03-07 NOTE — Telephone Encounter (Signed)
-----   Message from [name], MD sent at 03/06/2019  1:57 PM EDT ----- Sherald Hess inform she has a large gall bladder polyp- I would suggest she have the gall bladder taken out as it can at times lead to cancer. IF patient wishes to discuss further then schedule office visit otherwise refer to Dr Dahlia Byes for cholecystectomy. F/u with me after surgery . C/c Dr Rush Landmark- previously plan was to discuss EUS based on this report. It appears stable@ pancreatic cyst- do you recommend EUS or repeat MRI in 1 year   C/c Boscia, Greer Ee, NP   Dr [name] MD,MRCP [facility]) Gastroenterology/Hepatology Pager: [phone]

## 2019-03-08 ENCOUNTER — Ambulatory Visit: Payer: Medicare Other | Admitting: Gastroenterology

## 2019-03-08 ENCOUNTER — Ambulatory Visit (INDEPENDENT_AMBULATORY_CARE_PROVIDER_SITE_OTHER): Payer: Medicare Other | Admitting: Gastroenterology

## 2019-03-08 ENCOUNTER — Other Ambulatory Visit: Payer: Self-pay

## 2019-03-08 DIAGNOSIS — R1013 Epigastric pain: Secondary | ICD-10-CM | POA: Diagnosis not present

## 2019-03-08 DIAGNOSIS — R933 Abnormal findings on diagnostic imaging of other parts of digestive tract: Secondary | ICD-10-CM | POA: Diagnosis not present

## 2019-03-08 DIAGNOSIS — K824 Cholesterolosis of gallbladder: Secondary | ICD-10-CM | POA: Diagnosis not present

## 2019-03-08 NOTE — Progress Notes (Signed)
Whitney [name] , MD [address]  [place]  [place], [place] [address]  Main: [phone]  Fax: [phone]   Primary Care Physician: Ronnell Freshwater, NP  Virtual Visit via Telephone Note  I connected with patient on 03/08/19 at 11:30 AM EDT by telephone and verified that I am speaking with the correct person using two identifiers.   I discussed the limitations, risks, security and privacy concerns of performing an evaluation and management service by telephone and the availability of in person appointments. I also discussed with the patient that there may be a patient responsible charge related to this service. The patient expressed understanding and agreed to proceed.  Location of Patient: Home Location of Provider: Home Persons involved: Patient and provider only   History of Present Illness: Follow up for dyspepsia  HPI: Whitney [name] is a 68 y.o. female   Summary of history :  Initially seen and referred on 3/31/2020for abdominal pain.Abdominal CT scan in March 2019also for abdominal distention nausea dizziness bradycardia. There was a small low-attenuation area within the body of the pancreas. Lobular right kidney with questionable low-attenuation area in the lower pole. Small uterine fibroids. Moderate abdominal aortic atherosclerosis and diffuse coronary artery calcifications. Multiple colonic diverticula at the recto sigmoid and descending colon. MRI of the abdomen in May 2019 which demonstrated benign liver abnormalities including multiple hemangiomas and cysts. Gallstone.'s multiple small cystic foci throughout the pancreas. Findings may represent small pseudocysts or areas of sidebranch duct ectasia. Cystic neoplasm of the pancreas not excluded follow-up imaging in 24 months recommended. 10/19/2018 HIDA scan: Normal.   Abdominal pain:Onset:2.5 years associated with passing out on 1 occasion . Pain no better after a meal Site :above belly  button Radiation:localized , each episode lasts 5-10 minutes , gets around 3 episodes a week . Severity :severe Nature of pain:dull pain Aggravating factors:unclear- not related to meals. Everytime she gets the pain starts sweating Relieving factors :when she lays down , Weight loss:has lost some weight with dieting does not think the pain has made her lose weight. NSAID use:no - she used to take Motrin - stopped PPI use :no Gall bladder surgery:intact Frequency of bowel movements:almost everyday - very regular Change in bowel movements:no Relief with bowel movements:unsure Gas/Bloating/Abdominal distension:no   Interval history6/30/2020 -03/08/2019   02/18/2019: H pylori breath test - negative 03/04/2019: MRCP- progression of the gall bladder polyp which is 10 mm in size . Pancreas lesion is stable repeat in 1 year  Discussed with Dr Rush Landmark who agrees with the plan for MRI in 1 year   Abdominal pain much better after commencing on IB guard.  Never had a colonoscopy or an EGD.      Current Outpatient Medications  Medication Sig Dispense Refill   atorvastatin (LIPITOR) 40 MG tablet Take 1 tablet (40 mg total) by mouth daily at 6 PM. 90 tablet 1   carvedilol (COREG) 3.125 MG tablet Take 2 tablets (6.25 mg total) by mouth 2 (two) times daily with a meal. 120 tablet 3   carvedilol (COREG) 6.25 MG tablet Take 6.25 mg by mouth 2 (two) times a day.     diclofenac sodium (VOLTAREN) 1 % GEL Apply 1 application topically 4 (four) times daily as needed (pain).   0   dicyclomine (BENTYL) 10 MG capsule Take 1 capsule (10 mg total) by mouth 4 (four) times daily -  before meals and at bedtime. As needed (Patient taking differently: Take 10 mg by mouth  2 (two) times a day. ) 120 capsule 2   famotidine (PEPCID) 40 MG tablet Take 1 tablet (40 mg total) by mouth daily. 90 tablet 1   flecainide (TAMBOCOR) 150 MG tablet Take 150 mg by mouth 2 (two) times daily.      gabapentin (NEURONTIN) 100 MG capsule Take 1 capsule (100 mg total) by mouth 2 (two) times daily. (Patient taking differently: Take 100 mg by mouth 2 (two) times daily as needed (pain). ) 90 capsule 0   hydrALAZINE (APRESOLINE) 10 MG tablet Take 1 tablet (10 mg total) by mouth 3 (three) times daily. 270 tablet 1   rOPINIRole (REQUIP) 0.5 MG tablet Takes 1 or 2 tablets daily as needed (Patient taking differently: Take 0.5 mg by mouth 2 (two) times daily as needed (restless legs). ) 90 tablet 1   traMADol (ULTRAM) 50 MG tablet Take 1 tablet (50 mg total) by mouth every 6 (six) hours as needed. (Patient taking differently: Take 50 mg by mouth every 6 (six) hours as needed for moderate pain. ) 20 tablet 0   traZODone (DESYREL) 50 MG tablet Take 1 tablet (50 mg total) by mouth at bedtime as needed for sleep. 30 tablet 3   No current facility-administered medications for this visit.     Allergies as of 03/08/2019 - Review Complete 02/14/2019  Allergen Reaction Noted   Enalapril Swelling    Lisinopril Swelling 05/26/2016    Review of Systems:    All systems reviewed and negative except where noted in HPI.   Observations/Objective:  Labs: CMP     Component Value Date/Time   NA 140 01/03/2019 0850   NA 144 09/27/2018 1036   K 4.1 01/03/2019 0850   CL 108 01/03/2019 0850   CO2 26 01/03/2019 0850   GLUCOSE 92 01/03/2019 0850   BUN 22 01/03/2019 0850   BUN 25 09/27/2018 1036   CREATININE 1.30 (H) 03/04/2019 1127   CALCIUM 8.4 (L) 01/03/2019 0850   PROT 6.5 09/27/2018 1036   ALBUMIN 3.5 (L) 09/27/2018 1036   AST 14 09/27/2018 1036   ALT 12 09/27/2018 1036   ALKPHOS 102 09/27/2018 1036   BILITOT 0.3 09/27/2018 1036   GFRNONAA 51 (L) 01/03/2019 0850   GFRAA 59 (L) 01/03/2019 0850   Lab Results  Component Value Date   WBC 5.5 01/03/2019   HGB 13.3 01/03/2019   HCT 41.9 01/03/2019   MCV 93.3 01/03/2019   PLT 243 01/03/2019    Imaging Studies: Mr Abdomen Mrcp W Wo  Contast  Result Date: 03/04/2019 CLINICAL DATA:  Pancreatic cysts noted on previous MRI. EXAM: MRI ABDOMEN WITHOUT AND WITH CONTRAST (INCLUDING MRCP) TECHNIQUE: Multiplanar multisequence MR imaging of the abdomen was performed both before and after the administration of intravenous contrast. Heavily T2-weighted images of the biliary and pancreatic ducts were obtained, and three-dimensional MRCP images were rendered by post processing. CONTRAST:  10 cc Gadavist COMPARISON:  12/26/2017 FINDINGS: Lower chest: Unremarkable. Hepatobiliary: 2.8 cm simple cyst described previously in segment VIII is unchanged in the interval. Other scattered tiny cystic foci in the liver parenchyma are stable. 10 mm enhancing polyp noted in the gallbladder lumen. There is no evidence for gallstones, gallbladder wall thickening, or pericholecystic fluid. No intrahepatic or extrahepatic biliary dilation. Pancreas: Multiple cystic lesions are again identified in the pancreas. Dominant lesion is noted near the junction of the body and tail, as before measuring 1.3 x 1.0 cm today, unchanged in size (image 19/series 3). A smaller cystic lesion in  the head of pancreas measures 6 mm today, also stable. The third cystic lesion described previously in the neck of the pancreas is stable today (22/3). No dilatation of the main pancreatic duct. Spleen:  No splenomegaly. No focal mass lesion. Adrenals/Urinary Tract: No adrenal nodule or mass. Left nephrectomy again noted. 13 mm simple cyst noted interpolar right kidney. Right kidney otherwise unremarkable. Stomach/Bowel: Stomach is unremarkable. No gastric wall thickening. No evidence of outlet obstruction. Duodenum is normally positioned as is the ligament of Treitz. No small bowel or colonic dilatation within the visualized abdomen. Vascular/Lymphatic: No abdominal aortic aneurysm. No abdominal lymphadenopathy. Other:  No intraperitoneal free fluid. Musculoskeletal: No abnormal marrow signal within  the visualized bony anatomy. IMPRESSION: 1. Slight progression of an enhancing gallbladder polyp now measuring 10 mm. Given the increased risk of carcinoma in gallbladder polyps over 10 mm, surgical consultation recommended. 2. Tiny cystic foci in the pancreas are stable in the 1 year interval since prior study. Repeat MRI in 12 months recommended to ensure stability. Consensus guidelines recommend annual imaging for 5 years of follow-up. This recommendation follows ACR consensus guidelines: Management of Incidental Pancreatic Cysts: A White Paper of the ACR Incidental Findings Committee. J Am Coll Radiol 4259;56:387-564. 3. Left nephrectomy. Electronically Signed   By: Misty Stanley M.D.   On: 03/04/2019 13:17    Assessment and Plan:   Whitney [name] is a 68 y.o. y/o female here to follow upfor abdominal pain. Appears to be longstanding. Previous abdominal imaging showed abnormalities of the pancreas and per radiology report recommended repeating scan in 2 years. HIDA normal.Her symptoms presently could be from functional dyspepsia. Gall bladder polyp is 10 mm and at that size with symptoms would recommend a cholecystectomy . The pancreas lesion is stable   Plan :  1.EGD+ colonoscopy  2. MRI pancreas in 1 year 3. Refer to Dr Dahlia Byes for cholecystectomy for large gall bladder polyp with symptoms    I discussed the assessment and treatment plan with the patient. The patient was provided an opportunity to ask questions and all were answered. The patient agreed with the plan and demonstrated an understanding of the instructions.   The patient was advised to call back or seek an in-person evaluation if the symptoms worsen or if the condition fails to improve as anticipated.  I provided 13 minutes of non-face-to-face time during this encounter.  Dr Whitney Bellows MD,MRCP [facility]) Gastroenterology/Hepatology Pager: [phone]   Speech recognition software was used to dictate this note.

## 2019-03-09 ENCOUNTER — Telehealth: Payer: Self-pay | Admitting: Gastroenterology

## 2019-03-09 NOTE — Telephone Encounter (Signed)
This case has already been discussed previously with Dr. Vicente Males. Plan for MRI/MRCP in 1-year and then consideration of EUS if necessary based on changes. Please see prior notes in chart. Thanks. GM

## 2019-03-09 NOTE — Telephone Encounter (Signed)
Referral to Dr. Rush Landmark for EUS to evaluate the cystic lesions of the pancreas.

## 2019-03-09 NOTE — Telephone Encounter (Signed)
Please let referring know. Thanks

## 2019-03-10 ENCOUNTER — Other Ambulatory Visit: Payer: Self-pay

## 2019-03-10 ENCOUNTER — Ambulatory Visit (INDEPENDENT_AMBULATORY_CARE_PROVIDER_SITE_OTHER): Payer: Medicare Other | Admitting: Internal Medicine

## 2019-03-10 ENCOUNTER — Encounter: Payer: Self-pay | Admitting: Internal Medicine

## 2019-03-10 VITALS — BP 149/98 | HR 72 | Resp 16 | Ht 65.0 in | Wt 256.0 lb

## 2019-03-10 DIAGNOSIS — I10 Essential (primary) hypertension: Secondary | ICD-10-CM | POA: Diagnosis not present

## 2019-03-10 DIAGNOSIS — G2581 Restless legs syndrome: Secondary | ICD-10-CM | POA: Diagnosis not present

## 2019-03-10 DIAGNOSIS — G4733 Obstructive sleep apnea (adult) (pediatric): Secondary | ICD-10-CM

## 2019-03-10 NOTE — Progress Notes (Signed)
[facility] [place], [place] [address]  Pulmonary Sleep Medicine   Office Visit Note  Patient Name: Whitney [name] DOB: April 28, 1951 MRN [id number]  Date of Service: 03/10/2019  Complaints/HPI: PT is here for follow up and surgical clearance.  Her FEV1 is normal on last PFT last month.  She denies any issues with her breathing at this time. She does have an OSA diagnosis, we are awaiting a cpap titration study at this time.   ROS  General: (-) fever, (-) chills, (-) night sweats, (-) weakness Skin: (-) rashes, (-) itching,. Eyes: (-) visual changes, (-) redness, (-) itching. Nose and Sinuses: (-) nasal stuffiness or itchiness, (-) postnasal drip, (-) nosebleeds, (-) sinus trouble. Mouth and Throat: (-) sore throat, (-) hoarseness. Neck: (-) swollen glands, (-) enlarged thyroid, (-) neck pain. Respiratory: - cough, (-) bloody sputum, - shortness of breath, - wheezing. Cardiovascular: - ankle swelling, (-) chest pain. Lymphatic: (-) lymph node enlargement. Neurologic: (-) numbness, (-) tingling. Psychiatric: (-) anxiety, (-) depression   Current Medication: Outpatient Encounter Medications as of 03/10/2019  Medication Sig  . atorvastatin (LIPITOR) 40 MG tablet Take 1 tablet (40 mg total) by mouth daily at 6 PM.  . carvedilol (COREG) 3.125 MG tablet Take 2 tablets (6.25 mg total) by mouth 2 (two) times daily with a meal.  . carvedilol (COREG) 6.25 MG tablet Take 6.25 mg by mouth 2 (two) times a day.  . diclofenac sodium (VOLTAREN) 1 % GEL Apply 1 application topically 4 (four) times daily as needed (pain).   Marland Kitchen dicyclomine (BENTYL) 10 MG capsule Take 1 capsule (10 mg total) by mouth 4 (four) times daily -  before meals and at bedtime. As needed (Patient taking differently: Take 10 mg by mouth 2 (two) times a day. )  . famotidine (PEPCID) 40 MG tablet Take 1 tablet (40 mg total) by mouth daily.  . flecainide (TAMBOCOR) 150 MG tablet Take 150 mg by mouth 2 (two)  times daily.  Marland Kitchen gabapentin (NEURONTIN) 100 MG capsule Take 1 capsule (100 mg total) by mouth 2 (two) times daily. (Patient taking differently: Take 100 mg by mouth 2 (two) times daily as needed (pain). )  . hydrALAZINE (APRESOLINE) 10 MG tablet Take 1 tablet (10 mg total) by mouth 3 (three) times daily.  Marland Kitchen rOPINIRole (REQUIP) 0.5 MG tablet Takes 1 or 2 tablets daily as needed (Patient taking differently: Take 0.5 mg by mouth 2 (two) times daily as needed (restless legs). )  . traMADol (ULTRAM) 50 MG tablet Take 1 tablet (50 mg total) by mouth every 6 (six) hours as needed. (Patient taking differently: Take 50 mg by mouth every 6 (six) hours as needed for moderate pain. )  . traZODone (DESYREL) 50 MG tablet Take 1 tablet (50 mg total) by mouth at bedtime as needed for sleep.   No facility-administered encounter medications on file as of 03/10/2019.     Surgical History: Past Surgical History:  Procedure Laterality Date  . HYSTEROSCOPY W/D&C N/A 02/16/2017   Procedure: DILATATION AND CURETTAGE /HYSTEROSCOPY;  Surgeon: Defrancesco, Alanda Slim, MD;  Location: [facility] ORS;  Service: Gynecology;  Laterality: N/A;  . kidney removal Left   . KIDNEY SURGERY Left 1979   left kidney removed  . LEFT HEART CATH AND CORONARY ANGIOGRAPHY N/A 10/21/2017   Procedure: LEFT HEART CATH AND CORONARY ANGIOGRAPHY;  Surgeon: Teodoro Spray, MD;  Location: [facility];  Service: Cardiovascular;  Laterality: N/A;  . TOTAL KNEE  ARTHROPLASTY Left 07/06/2017   Procedure: TOTAL KNEE ARTHROPLASTY;  Surgeon: Lovell Sheehan, MD;  Location: [facility] OR[facility];  Service: Orthopedics;  Laterality: Left;  . WRIST ARTHROSCOPY Left 1970s    Medical History: Past Medical History:  Diagnosis Date  . Abnormal Pap smear of cervix    Pt states she had colposcopy   . Arthritis   . Chronic kidney disease   . Dyspnea   . Dysrhythmia   . Hx of unilateral nephrectomy 1979   Left Nephrectomy  . Hypertension   . Lower extremity edema   .  Palpitations   . PMB (postmenopausal bleeding)   . Restless leg syndrome   . Vitamin D deficiency     Family History: Family History  Problem Relation Age of Onset  . Ovarian cancer Mother   . Hypertension Father   . Diabetes Father   . Cancer Father   . Breast cancer Sister   . Diabetes Sister     Social History: Social History   Socioeconomic History  . Marital status: Divorced    Spouse name: Not on file  . Number of children: Not on file  . Years of education: Not on file  . Highest education level: Not on file  Occupational History  . Not on file  Social Needs  . Financial resource strain: Not on file  . Food insecurity    Worry: Not on file    Inability: Not on file  . Transportation needs    Medical: Not on file    Non-medical: Not on file  Tobacco Use  . Smoking status: Never Smoker  . Smokeless tobacco: Never Used  Substance and Sexual Activity  . Alcohol use: Yes    Alcohol/week: 2.0 standard drinks    Types: 2 Standard drinks or equivalent per week    Comment: occasionally  . Drug use: Yes    Frequency: 3.0 times per week    Types: Marijuana    Comment: Pt states she uses for leg pain: marijuana; states no longer uses cocaine  . Sexual activity: Yes    Birth control/protection: None  Lifestyle  . Physical activity    Days per week: Not on file    Minutes per session: Not on file  . Stress: Not on file  Relationships  . Social Herbalist on phone: Not on file    Gets together: Not on file    Attends religious service: Not on file    Active member of club or organization: Not on file    Attends meetings of clubs or organizations: Not on file    Relationship status: Not on file  . Intimate partner violence    Fear of current or ex partner: Not on file    Emotionally abused: Not on file    Physically abused: Not on file    Forced sexual activity: Not on file  Other Topics Concern  . Not on file  Social History Narrative  . Not on  file    Vital Signs: Blood pressure (!) 149/98, pulse 72, resp. rate 16, height 5\' 5"  (1.651 m), weight 256 lb (116.1 kg), last menstrual period 01/26/2017, SpO2 97 %.  Examination: General Appearance: The patient is well-developed, well-nourished, and in no distress. Skin: Gross inspection of skin unremarkable. Head: normocephalic, no gross deformities. Eyes: no gross deformities noted. ENT: ears appear grossly normal no exudates. Neck: Supple. No thyromegaly. No LAD. Respiratory: clear bilaterally. Cardiovascular: Normal S1 and S2 without murmur  or rub. Extremities: No cyanosis. pulses are equal. Neurologic: Alert and oriented. No involuntary movements.  LABS: Recent Results (from the past 2160 hour(s))  APTT     Status: None   Collection Time: 01/03/19  8:50 AM  Result Value Ref Range   aPTT 28 24 - 36 seconds    Comment: Performed at [facility], [address] [place], [place] [address]  Basic metabolic panel     Status: Abnormal   Collection Time: 01/03/19  8:50 AM  Result Value Ref Range   Sodium 140 135 - 145 mmol/L   Potassium 4.1 3.5 - 5.1 mmol/L   Chloride 108 98 - 111 mmol/L   CO2 26 22 - 32 mmol/L   Glucose, Bld 92 70 - 99 mg/dL   BUN 22 8 - 23 mg/dL   Creatinine, Ser 1.11 (H) 0.44 - 1.00 mg/dL   Calcium 8.4 (L) 8.9 - 10.3 mg/dL   GFR calc non Af Amer 51 (L) >60 mL/min   GFR calc Af Amer 59 (L) >60 mL/min   Anion gap 6 5 - 15    Comment: Performed at [facility], [address] [place], [place] [address]  CBC     Status: None   Collection Time: 01/03/19  8:50 AM  Result Value Ref Range   WBC 5.5 4.0 - 10.5 K/uL   RBC 4.49 3.87 - 5.11 MIL/uL   Hemoglobin 13.3 12.0 - 15.0 g/dL   HCT 41.9 36.0 - 46.0 %   MCV 93.3 80.0 - 100.0 fL   MCH 29.6 26.0 - 34.0 pg   MCHC 31.7 30.0 - 36.0 g/dL   RDW 12.8 11.5 - 15.5 %   Platelets 243 150 - 400 K/uL   nRBC 0.0 0.0 - 0.2 %    Comment: Performed at [facility], [address]  [place], [place] [address]  Protime-INR     Status: None   Collection Time: 01/03/19  8:50 AM  Result Value Ref Range   Prothrombin Time 11.7 11.4 - 15.2 seconds   INR 0.9 0.8 - 1.2    Comment: (NOTE) INR goal varies based on device and disease states. Performed at [facility], [address] [place], [place] [address]   Urinalysis, Routine w reflex microscopic     Status: Abnormal   Collection Time: 01/03/19  8:50 AM  Result Value Ref Range   Color, Urine YELLOW (A) YELLOW   APPearance HAZY (A) CLEAR   Specific Gravity, Urine 1.014 1.005 - 1.030   pH 6.0 5.0 - 8.0   Glucose, UA NEGATIVE NEGATIVE mg/dL   Hgb urine dipstick NEGATIVE NEGATIVE   Bilirubin Urine NEGATIVE NEGATIVE   Ketones, ur NEGATIVE NEGATIVE mg/dL   Protein, ur 100 (A) NEGATIVE mg/dL   Nitrite NEGATIVE NEGATIVE   Leukocytes,Ua NEGATIVE NEGATIVE   RBC / HPF 0-5 0 - 5 RBC/hpf   WBC, UA 0-5 0 - 5 WBC/hpf   Bacteria, UA RARE (A) NONE SEEN   Squamous Epithelial / LPF 0-5 0 - 5   Mucus PRESENT     Comment: Performed at [facility], [address] [place],  [address]  Surgical pcr screen     Status: None   Collection Time: 01/03/19  8:50 AM   Specimen: Nasal Mucosa; Nasal Swab  Result Value Ref Range   MRSA, PCR NEGATIVE NEGATIVE   Staphylococcus aureus NEGATIVE NEGATIVE    Comment: (NOTE) The Xpert SA Assay (FDA approved for NASAL specimens  in patients 60 years of age and older), is one component of a comprehensive surveillance program. It is not intended to diagnose infection nor to guide or monitor treatment. Performed at [facility], [address] [place], [place] [address]   Type and screen [place]     Status: None   Collection Time: 01/03/19  8:50 AM  Result Value Ref Range   ABO/RH(D) B POS    Antibody Screen NEG    Sample Expiration 01/17/2019,2359    Extend sample reason      NO TRANSFUSIONS OR PREGNANCY IN THE PAST 3 MONTHS Performed  at [facility], [address] [place], [place] [address]   H. pylori breath test     Status: None   Collection Time: 02/18/19 11:21 AM  Result Value Ref Range   H pylori Breath Test Negative Negative  I-STAT creatinine     Status: Abnormal   Collection Time: 03/04/19 11:27 AM  Result Value Ref Range   Creatinine, Ser 1.30 (H) 0.44 - 1.00 mg/dL    Radiology: Mr Abdomen Mrcp W Wo Contast  Result Date: 03/04/2019 CLINICAL DATA:  Pancreatic cysts noted on previous MRI. EXAM: MRI ABDOMEN WITHOUT AND WITH CONTRAST (INCLUDING MRCP) TECHNIQUE: Multiplanar multisequence MR imaging of the abdomen was performed both before and after the administration of intravenous contrast. Heavily T2-weighted images of the biliary and pancreatic ducts were obtained, and three-dimensional MRCP images were rendered by post processing. CONTRAST:  10 cc Gadavist COMPARISON:  12/26/2017 FINDINGS: Lower chest: Unremarkable. Hepatobiliary: 2.8 cm simple cyst described previously in segment VIII is unchanged in the interval. Other scattered tiny cystic foci in the liver parenchyma are stable. 10 mm enhancing polyp noted in the gallbladder lumen. There is no evidence for gallstones, gallbladder wall thickening, or pericholecystic fluid. No intrahepatic or extrahepatic biliary dilation. Pancreas: Multiple cystic lesions are again identified in the pancreas. Dominant lesion is noted near the junction of the body and tail, as before measuring 1.3 x 1.0 cm today, unchanged in size (image 19/series 3). A smaller cystic lesion in the head of pancreas measures 6 mm today, also stable. The third cystic lesion described previously in the neck of the pancreas is stable today (22/3). No dilatation of the main pancreatic duct. Spleen:  No splenomegaly. No focal mass lesion. Adrenals/Urinary Tract: No adrenal nodule or mass. Left nephrectomy again noted. 13 mm simple cyst noted interpolar right kidney. Right kidney otherwise  unremarkable. Stomach/Bowel: Stomach is unremarkable. No gastric wall thickening. No evidence of outlet obstruction. Duodenum is normally positioned as is the ligament of Treitz. No small bowel or colonic dilatation within the visualized abdomen. Vascular/Lymphatic: No abdominal aortic aneurysm. No abdominal lymphadenopathy. Other:  No intraperitoneal free fluid. Musculoskeletal: No abnormal marrow signal within the visualized bony anatomy. IMPRESSION: 1. Slight progression of an enhancing gallbladder polyp now measuring 10 mm. Given the increased risk of carcinoma in gallbladder polyps over 10 mm, surgical consultation recommended. 2. Tiny cystic foci in the pancreas are stable in the 1 year interval since prior study. Repeat MRI in 12 months recommended to ensure stability. Consensus guidelines recommend annual imaging for 5 years of follow-up. This recommendation follows ACR consensus guidelines: Management of Incidental Pancreatic Cysts: A White Paper of the ACR Incidental Findings Committee. J Am Coll Radiol 5176;16:073-710. 3. Left nephrectomy. Electronically Signed   By: Misty Stanley M.D.   On: 03/04/2019 13:17    No results found.  Mr Abdomen Mrcp W Wo Contast  Result Date: 03/04/2019 CLINICAL DATA:  Pancreatic cysts noted on previous MRI. EXAM: MRI ABDOMEN WITHOUT AND WITH CONTRAST (INCLUDING MRCP) TECHNIQUE: Multiplanar multisequence MR imaging of the abdomen was performed both before and after the administration of intravenous contrast. Heavily T2-weighted images of the biliary and pancreatic ducts were obtained, and three-dimensional MRCP images were rendered by post processing. CONTRAST:  10 cc Gadavist COMPARISON:  12/26/2017 FINDINGS: Lower chest: Unremarkable. Hepatobiliary: 2.8 cm simple cyst described previously in segment VIII is unchanged in the interval. Other scattered tiny cystic foci in the liver parenchyma are stable. 10 mm enhancing polyp noted in the gallbladder lumen. There is no  evidence for gallstones, gallbladder wall thickening, or pericholecystic fluid. No intrahepatic or extrahepatic biliary dilation. Pancreas: Multiple cystic lesions are again identified in the pancreas. Dominant lesion is noted near the junction of the body and tail, as before measuring 1.3 x 1.0 cm today, unchanged in size (image 19/series 3). A smaller cystic lesion in the head of pancreas measures 6 mm today, also stable. The third cystic lesion described previously in the neck of the pancreas is stable today (22/3). No dilatation of the main pancreatic duct. Spleen:  No splenomegaly. No focal mass lesion. Adrenals/Urinary Tract: No adrenal nodule or mass. Left nephrectomy again noted. 13 mm simple cyst noted interpolar right kidney. Right kidney otherwise unremarkable. Stomach/Bowel: Stomach is unremarkable. No gastric wall thickening. No evidence of outlet obstruction. Duodenum is normally positioned as is the ligament of Treitz. No small bowel or colonic dilatation within the visualized abdomen. Vascular/Lymphatic: No abdominal aortic aneurysm. No abdominal lymphadenopathy. Other:  No intraperitoneal free fluid. Musculoskeletal: No abnormal marrow signal within the visualized bony anatomy. IMPRESSION: 1. Slight progression of an enhancing gallbladder polyp now measuring 10 mm. Given the increased risk of carcinoma in gallbladder polyps over 10 mm, surgical consultation recommended. 2. Tiny cystic foci in the pancreas are stable in the 1 year interval since prior study. Repeat MRI in 12 months recommended to ensure stability. Consensus guidelines recommend annual imaging for 5 years of follow-up. This recommendation follows ACR consensus guidelines: Management of Incidental Pancreatic Cysts: A White Paper of the ACR Incidental Findings Committee. J Am Coll Radiol 5498;26:415-830. 3. Left nephrectomy. Electronically Signed   By: Misty Stanley M.D.   On: 03/04/2019 13:17      Assessment and Plan: Patient  Active Problem List   Diagnosis Date Noted  . Primary osteoarthritis of right knee 09/15/2018  . Encounter for pre-operative examination 09/15/2018  . Obstructive sleep apnea 09/15/2018  . Calculus of gallbladder without cholecystitis without obstruction 09/15/2018  . Calculus of gallbladder with cholecystitis without biliary obstruction 01/27/2018  . Restless leg syndrome 01/27/2018  . Generalized abdominal pain 01/11/2018  . Cyst of pancreas 01/11/2018  . Other specified disorders of kidney and ureter 01/11/2018  . Primary insomnia 01/11/2018  . Paroxysmal atrial fibrillation ([place]) 11/12/2017  . Essential hypertension 10/25/2017  . Mixed hyperlipidemia 10/25/2017  . Low back pain at multiple sites 10/25/2017  . Dysuria 10/25/2017  . Encounter for general adult medical examination with abnormal findings 10/25/2017  . Vitamin D deficiency 10/25/2017  . Near syncope   . Symptomatic bradycardia 10/19/2017  . Carpal tunnel syndrome 10/12/2017  . Primary osteoarthritis of left knee 07/09/2017  . Total knee replacement status, left 07/06/2017  . Chest pain 06/29/2017  . Postop check 02/25/2017  . Impingement syndrome of shoulder region 01/20/2017  . Neck pain 01/20/2017  . Obesity (BMI 35.0-39.9 without comorbidity) 07/15/2016  .  Endometrial polyp 07/15/2016  . Morbid obesity ([place]) 07/15/2016  . Family history of breast cancer in first degree relative 07/15/2016  . Family history of ovarian cancer 07/15/2016  . Knee pain 07/04/2016  . Bilateral leg pain 06/24/2016    1. OSA (obstructive sleep apnea) Continues to be untreated, we are awaiting a titration study to order her machine.   2. Essential hypertension Continue to follow up with Dr. Ubaldo Glassing.  3. Restless leg syndrome Continue present management.   General Counseling: I have discussed the findings of the evaluation and examination with Juliann Pulse.  I have also discussed any further diagnostic evaluation thatmay be needed or  ordered today. Paislie verbalizes understanding of the findings of todays visit. We also reviewed her medications today and discussed drug interactions and side effects including but not limited excessive drowsiness and altered mental states. We also discussed that there is always a risk not just to her but also people around her. she has been encouraged to call the office with any questions or concerns that should arise related to todays visit.    Time spent: 15 This patient was seen by Orson Gear AGNP-C in Collaboration with Dr. Devona Konig as a part of collaborative care agreement.   I have personally obtained a history, examined the patient, evaluated laboratory and imaging results, formulated the assessment and plan and placed orders.    Allyne Gee, MD [facility] Pulmonary and Critical Care Sleep medicine

## 2019-03-28 ENCOUNTER — Other Ambulatory Visit: Payer: Self-pay

## 2019-03-28 ENCOUNTER — Ambulatory Visit (INDEPENDENT_AMBULATORY_CARE_PROVIDER_SITE_OTHER): Payer: Medicare Other | Admitting: Surgery

## 2019-03-28 ENCOUNTER — Encounter: Payer: Self-pay | Admitting: Surgery

## 2019-03-28 VITALS — BP 140/82 | HR 80 | Temp 97.9°F | Ht 65.0 in | Wt 257.6 lb

## 2019-03-28 DIAGNOSIS — K8020 Calculus of gallbladder without cholecystitis without obstruction: Secondary | ICD-10-CM

## 2019-03-28 DIAGNOSIS — K824 Cholesterolosis of gallbladder: Secondary | ICD-10-CM

## 2019-03-29 ENCOUNTER — Encounter: Payer: Self-pay | Admitting: Surgery

## 2019-03-29 ENCOUNTER — Ambulatory Visit: Payer: Medicare Other | Admitting: Nurse Practitioner

## 2019-03-29 NOTE — Progress Notes (Signed)
Surgical Consultation  03/29/2019  Whitney [name] is an 68 y.o. female.   Chief Complaint  Patient presents with  . Follow-up    gallbladder polyp     HPI: [name] with multiple medical problems including chronic back pain.  She is seen in consultation at the request of Dr. Vicente [name] for a gallbladder polyp.  She reports some abdominal distention and intermittent abdominal pain.  This has been present for several months.  No fevers no chills no evidence of biliary obstruction.  She did have extensive work-up to include a CT scan as well as an MRI that I have personally reviewed showing a  Gallstone on one study and on the most recent one  a 1 cm polyp within the gallbladder.  SHe also had a HIDA scan which was negative.  She reports some nausea as well.  The pain is intermittent and sharp in nature located in the upper abdomen.  No specific aggravating or elevating factors.  No fevers no chills.  Does have a normal INR creatinine of 1.1 and rest of the labs are normal. Her BMI is 42. Dr .Whitney [name] will perform an EGD and colonoscopy at some point time.  Past Medical History:  Diagnosis Date  . Abnormal Pap smear of cervix    Pt states she had colposcopy   . Arthritis   . Chronic kidney disease   . Dyspnea   . Dysrhythmia   . Hx of unilateral nephrectomy 1979   Left Nephrectomy  . Hypertension   . Lower extremity edema   . Palpitations   . PMB (postmenopausal bleeding)   . Restless leg syndrome   . Vitamin D deficiency     Past Surgical History:  Procedure Laterality Date  . HYSTEROSCOPY W/D&C N/A 02/16/2017   Procedure: DILATATION AND CURETTAGE /HYSTEROSCOPY;  Surgeon: Defrancesco, Alanda Slim, MD;  Location: [facility] OR[facility];  Service: Gynecology;  Laterality: N/A;  . kidney removal Left   . KIDNEY SURGERY Left 1979   left kidney removed  . LEFT HEART CATH AND CORONARY ANGIOGRAPHY N/A 10/21/2017   Procedure: LEFT HEART CATH AND CORONARY ANGIOGRAPHY;  Surgeon: Whitney Spray, MD;   Location: [facility];  Service: Cardiovascular;  Laterality: N/A;  . TOTAL KNEE ARTHROPLASTY Left 07/06/2017   Procedure: TOTAL KNEE ARTHROPLASTY;  Surgeon: Whitney Sheehan, MD;  Location: [facility] ORS;  Service: Orthopedics;  Laterality: Left;  . WRIST ARTHROSCOPY Left 1970s    Family History  Problem Relation Age of Onset  . Ovarian cancer Mother   . Hypertension Father   . Diabetes Father   . Cancer Father   . Breast cancer Sister   . Diabetes Sister     Social History:  reports that she has never smoked. She has never used smokeless tobacco. She reports current alcohol use of about 2.0 standard drinks of alcohol per week. She reports current drug use. Frequency: 3.00 times per week. Drug: Marijuana.  Allergies:  Allergies  Allergen Reactions  . Enalapril Swelling  . Lisinopril Swelling    Medications reviewed.     ROS Full ROS performed and is otherwise negative other than what is stated in the HPI    BP 140/82   Pulse 80   Temp 97.9 F (36.6 C)   Ht 5\' 5"  (1.651 m)   Wt 257 lb 9.6 oz (116.8 kg)   LMP 01/26/2017 Comment: age 25  SpO2 96%   BMI 42.87 kg/m   Physical Exam Vitals signs and  nursing note reviewed. Exam conducted with a chaperone present.  Constitutional:      General: She is not in acute distress.    Appearance: Normal appearance. She is obese. She is not toxic-appearing.  Eyes:     General: No scleral icterus.       Left eye: No discharge.  Neck:     Musculoskeletal: Normal range of motion and neck supple. No neck rigidity or muscular tenderness.  Cardiovascular:     Rate and Rhythm: Normal rate.  Pulmonary:     Effort: Pulmonary effort is normal. No respiratory distress.     Breath sounds: Normal breath sounds. No stridor.  Abdominal:     General: Abdomen is flat. Bowel sounds are normal.     Palpations: Abdomen is soft. There is no mass.     Tenderness: There is abdominal tenderness. There is no rebound.     Hernia: No hernia is  present.     Comments: Diffuse upper abdominal tenderness.  No peritonitis.  No Murphy sign  Musculoskeletal: Normal range of motion.        General: No swelling.  Lymphadenopathy:     Cervical: No cervical adenopathy.  Skin:    General: Skin is warm and dry.     Coloration: Skin is not jaundiced.  Neurological:     General: No focal deficit present.     Mental Status: She is alert and oriented to person, place, and time.  Psychiatric:        Mood and Affect: Mood normal.        Behavior: Behavior normal.        Thought Content: Thought content normal.        Judgment: Judgment normal.      Assessment/Plan: 68 year old female with gallbladder polyp and abdominal pain.  Given the size of the gallbladder polyp I do recommend cholecystectomy.  Whether or not abdominal symptoms may be improved not necessarily certain about this.  The main purpose of the operation will be to decrease the chances of malignancy.  I had an extensive discussion with the patient regarding this specific issue. The risks, benefits, complications, treatment options, and expected outcomes were discussed with the patient. The possibilities of bleeding, recurrent infection, finding a normal gallbladder, perforation of viscus organs, damage to surrounding structures, bile leak, abscess formation, needing a drain placed, the need for additional procedures, reaction to medication, pulmonary aspiration,  failure to diagnose a condition, the possible need to convert to an open procedure, and creating a complication requiring transfusion or operation were discussed with the patient. The patient and/or family concurred with the proposed plan, giving informed consent.   Whitney Hamman, MD [facility] General Surgeon

## 2019-04-01 ENCOUNTER — Inpatient Hospital Stay: Admission: RE | Admit: 2019-04-01 | Payer: Medicare Other | Source: Ambulatory Visit

## 2019-04-01 ENCOUNTER — Telehealth: Payer: Self-pay | Admitting: Surgery

## 2019-04-01 NOTE — Telephone Encounter (Signed)
Pt advised of pre op date/time and sx date. Sx: 04/07/19 with Dr Kris Mouton assisted laparoscopic cholecystectomy. Pre op: Office interview-04/04/19 @ 12:45pm Covid testing-04/04/19 following Pre admit appointment.  Patient has made transportation arrangements for 04/04/19 and 04/07/19.  Patient made aware to call [phone], between 1-3:00pm the day before surgery, to find out what time to arrive.

## 2019-04-04 ENCOUNTER — Encounter
Admission: RE | Admit: 2019-04-04 | Discharge: 2019-04-04 | Disposition: A | Discharge status: Home / Self Care | Payer: Medicare Other | Source: Ambulatory Visit | Attending: Surgery | Admitting: Surgery

## 2019-04-04 ENCOUNTER — Other Ambulatory Visit: Payer: Self-pay

## 2019-04-04 DIAGNOSIS — K824 Cholesterolosis of gallbladder: Secondary | ICD-10-CM | POA: Diagnosis present

## 2019-04-04 DIAGNOSIS — Z20828 Contact with and (suspected) exposure to other viral communicable diseases: Secondary | ICD-10-CM | POA: Diagnosis not present

## 2019-04-04 DIAGNOSIS — Z5309 Procedure and treatment not carried out because of other contraindication: Secondary | ICD-10-CM | POA: Diagnosis not present

## 2019-04-04 DIAGNOSIS — R825 Elevated urine levels of drugs, medicaments and biological substances: Secondary | ICD-10-CM | POA: Diagnosis not present

## 2019-04-04 HISTORY — DX: Sleep apnea, unspecified: G47.30

## 2019-04-04 NOTE — Patient Instructions (Signed)
Your procedure is scheduled on: Thurs 9/3  Report to Day Surgery.  [facility] To find out your arrival time please call [phone] between [place] on Wed. 9/2.  Remember: Instructions that are not followed completely may result in serious medical risk,  up to and including death, or upon the discretion of your surgeon and anesthesiologist your  surgery may need to be rescheduled.     _X__ 1. Do not eat food after midnight the night before your procedure.                 No gum chewing or hard candies. You may drink clear liquids up to 2 hours                 before you are scheduled to arrive for your surgery- DO not drink clear                 liquids within 2 hours of the start of your surgery.                 Clear Liquids include:  water, apple juice without pulp, clear carbohydrate                 drink such as Clearfast of Gatorade, Black Coffee or Tea (Do not add                 anything to coffee or tea).  __X__2.  On the morning of surgery brush your teeth with toothpaste and water, you                may rinse your mouth with mouthwash if you wish.  Do not swallow any toothpaste of mouthwash.     _X__ 3.  No Alcohol for 24 hours before or after surgery.   ___ 4.  Do Not Smoke or use e-cigarettes For 24 Hours Prior to Your Surgery.                 Do not use any chewable tobacco products for at least 6 hours prior to                 surgery.  ____  5.  Bring all medications with you on the day of surgery if instructed.   _x__  6.  Notify your doctor if there is any change in your medical condition      (cold, fever, infections).     Do not wear jewelry, make-up, hairpins, clips or nail polish. Do not wear lotions, powders, or perfumes. You may wear deodorant. Do not shave 48 hours prior to surgery. Men may shave face and neck. Do not bring valuables to the hospital.    [facility] is not responsible for any belongings or  valuables.  Contacts, dentures or bridgework may not be worn into surgery. Leave your suitcase in the car. After surgery it may be brought to your room. For patients admitted to the hospital, discharge time is determined by your treatment team.   Patients discharged the day of surgery will not be allowed to drive home.   Please read over the following fact sheets that you were given:      _x___ Take these medicines the morning of surgery with A SIP OF WATER:    1. carvedilol (COREG) 3.125 MG tablet  2. famotidine (PEPCID) 40 MG tablet  Take dose the night before and the morning of surgery  3. flecainide (TAMBOCOR) 100 MG  tablet  4.hydrALAZINE (APRESOLINE) 10 MG tablet  5.traMADol (ULTRAM) 50 MG tablet  If needed  6.  ____ Fleet Enema (as directed)   __x__ Use CHG Soap as directed  ____ Use inhalers on the day of surgery  ____ Stop metformin 2 days prior to surgery    ____ Take 1/2 of usual insulin dose the night before surgery. No insulin the morning          of surgery.   __x__ Stop aspirin today  ____ Stop Anti-inflammatories on    ____ Stop supplements until after surgery.    ____ Bring C-Pap to the hospital.

## 2019-04-05 LAB — SARS CORONAVIRUS 2 (TAT 6-24 HRS): SARS Coronavirus 2: NEGATIVE

## 2019-04-06 ENCOUNTER — Ambulatory Visit: Payer: Medicare Other | Admitting: Anesthesiology

## 2019-04-06 MED ORDER — INDOCYANINE GREEN 25 MG IV SOLR: 7.5000 mg | Freq: Once | INTRAVENOUS | Status: DC

## 2019-04-06 NOTE — Addendum Note (Signed)
Addended by: Caroleen Hamman F on: 04/06/2019 01:11 PM   Modules accepted: Orders

## 2019-04-07 ENCOUNTER — Encounter
Admission: RE | Disposition: A | Discharge status: Home / Self Care | Payer: Self-pay | Source: Home / Self Care | Attending: Surgery

## 2019-04-07 ENCOUNTER — Encounter: Payer: Self-pay | Admitting: *Deleted

## 2019-04-07 ENCOUNTER — Ambulatory Visit
Admission: RE | Admit: 2019-04-07 | Discharge: 2019-04-07 | Disposition: A | Discharge status: Home / Self Care | Payer: Medicare Other | Attending: Surgery | Admitting: Surgery

## 2019-04-07 ENCOUNTER — Other Ambulatory Visit: Payer: Self-pay

## 2019-04-07 DIAGNOSIS — Z01818 Encounter for other preprocedural examination: Secondary | ICD-10-CM

## 2019-04-07 DIAGNOSIS — R825 Elevated urine levels of drugs, medicaments and biological substances: Secondary | ICD-10-CM | POA: Diagnosis not present

## 2019-04-07 DIAGNOSIS — Z5309 Procedure and treatment not carried out because of other contraindication: Secondary | ICD-10-CM | POA: Insufficient documentation

## 2019-04-07 DIAGNOSIS — Z20828 Contact with and (suspected) exposure to other viral communicable diseases: Secondary | ICD-10-CM | POA: Insufficient documentation

## 2019-04-07 DIAGNOSIS — K824 Cholesterolosis of gallbladder: Secondary | ICD-10-CM | POA: Insufficient documentation

## 2019-04-07 LAB — URINE DRUG SCREEN, QUALITATIVE (ARMC ONLY)
Amphetamines, Ur Screen: NOT DETECTED
Barbiturates, Ur Screen: NOT DETECTED
Benzodiazepine, Ur Scrn: NOT DETECTED
Cannabinoid 50 Ng, Ur ~~LOC~~: NOT DETECTED
Cocaine Metabolite,Ur ~~LOC~~: POSITIVE — AB
MDMA (Ecstasy)Ur Screen: NOT DETECTED
Methadone Scn, Ur: NOT DETECTED
Opiate, Ur Screen: NOT DETECTED
Phencyclidine (PCP) Ur S: NOT DETECTED
Tricyclic, Ur Screen: NOT DETECTED

## 2019-04-07 SURGERY — CHOLECYSTECTOMY, ROBOT-ASSISTED, LAPAROSCOPIC
Anesthesia: General

## 2019-04-07 MED ORDER — LACTATED RINGERS IV SOLN
INTRAVENOUS | Status: DC
  Administered 2019-04-07: 07:00:00 via INTRAVENOUS

## 2019-04-07 MED ORDER — GABAPENTIN 300 MG PO CAPS
300.0000 mg | ORAL_CAPSULE | ORAL | Status: AC
  Administered 2019-04-07: 07:00:00 300 mg via ORAL

## 2019-04-07 MED ORDER — CHLORHEXIDINE GLUCONATE CLOTH 2 % EX PADS: 6.0000 | MEDICATED_PAD | Freq: Once | CUTANEOUS | Status: DC

## 2019-04-07 MED ORDER — CEFAZOLIN SODIUM-DEXTROSE 2-4 GM/100ML-% IV SOLN
INTRAVENOUS | Status: AC
  Filled 2019-04-07: qty 100

## 2019-04-07 MED ORDER — PROPOFOL 10 MG/ML IV BOLUS
INTRAVENOUS | Status: AC
  Filled 2019-04-07: qty 20

## 2019-04-07 MED ORDER — MIDAZOLAM HCL 2 MG/2ML IJ SOLN
INTRAMUSCULAR | Status: AC
  Filled 2019-04-07: qty 2

## 2019-04-07 MED ORDER — CELECOXIB 200 MG PO CAPS
200.0000 mg | ORAL_CAPSULE | ORAL | Status: AC
  Administered 2019-04-07: 07:00:00 200 mg via ORAL

## 2019-04-07 MED ORDER — CEFAZOLIN SODIUM-DEXTROSE 2-4 GM/100ML-% IV SOLN: 2.0000 g | INTRAVENOUS | Status: DC

## 2019-04-07 MED ORDER — GABAPENTIN 300 MG PO CAPS
ORAL_CAPSULE | ORAL | Status: AC
  Administered 2019-04-07: 300 mg via ORAL
  Filled 2019-04-07: qty 1

## 2019-04-07 MED ORDER — INDOCYANINE GREEN 25 MG IV SOLR
7.5000 mg | Freq: Once | INTRAVENOUS | Status: DC
  Filled 2019-04-07 (×3): qty 25

## 2019-04-07 MED ORDER — ACETAMINOPHEN 500 MG PO TABS
ORAL_TABLET | ORAL | Status: AC
  Administered 2019-04-07: 1000 mg via ORAL
  Filled 2019-04-07: qty 2

## 2019-04-07 MED ORDER — ACETAMINOPHEN 500 MG PO TABS
1000.0000 mg | ORAL_TABLET | ORAL | Status: AC
  Administered 2019-04-07: 07:00:00 1000 mg via ORAL

## 2019-04-07 MED ORDER — CELECOXIB 200 MG PO CAPS
ORAL_CAPSULE | ORAL | Status: AC
  Administered 2019-04-07: 200 mg via ORAL
  Filled 2019-04-07: qty 1

## 2019-04-07 MED ORDER — FENTANYL CITRATE (PF) 100 MCG/2ML IJ SOLN
INTRAMUSCULAR | Status: AC
  Filled 2019-04-07: qty 2

## 2019-04-07 SURGICAL SUPPLY — 42 items
CANISTER SUCT 1200ML W/VALVE (MISCELLANEOUS) ×2 IMPLANT
CHLORAPREP W/TINT 26 (MISCELLANEOUS) ×2 IMPLANT
CLIP VESOLOCK MED LG 6/CT (CLIP) ×2 IMPLANT
COVER WAND RF STERILE (DRAPES) ×2 IMPLANT
DECANTER SPIKE VIAL GLASS SM (MISCELLANEOUS) ×2 IMPLANT
DEFOGGER SCOPE WARMER CLEARIFY (MISCELLANEOUS) ×2 IMPLANT
DERMABOND ADVANCED (GAUZE/BANDAGES/DRESSINGS) ×1
DERMABOND ADVANCED .7 DNX12 (GAUZE/BANDAGES/DRESSINGS) ×1 IMPLANT
DRAPE 3/4 80X56 (DRAPES) ×2 IMPLANT
DRAPE ARM DVNC X/XI (DISPOSABLE) ×4 IMPLANT
DRAPE COLUMN DVNC XI (DISPOSABLE) ×1 IMPLANT
DRAPE DA VINCI XI ARM (DISPOSABLE) ×4
DRAPE DA VINCI XI COLUMN (DISPOSABLE) ×1
ELECT CAUTERY BLADE 6.4 (BLADE) ×2 IMPLANT
ELECT REM PT RETURN 9FT ADLT (ELECTROSURGICAL) ×2
ELECTRODE REM PT RTRN 9FT ADLT (ELECTROSURGICAL) ×1 IMPLANT
GLOVE BIO SURGEON STRL SZ7 (GLOVE) ×4 IMPLANT
GOWN STRL REUS W/ TWL LRG LVL3 (GOWN DISPOSABLE) ×4 IMPLANT
GOWN STRL REUS W/TWL LRG LVL3 (GOWN DISPOSABLE) ×4
IRRIGATION STRYKERFLOW (MISCELLANEOUS) IMPLANT
IRRIGATOR STRYKERFLOW (MISCELLANEOUS)
IV NS 1000ML (IV SOLUTION)
IV NS 1000ML BAXH (IV SOLUTION) IMPLANT
KIT PINK PAD W/HEAD ARE REST (MISCELLANEOUS) ×2
KIT PINK PAD W/HEAD ARM REST (MISCELLANEOUS) ×1 IMPLANT
LABEL OR SOLS (LABEL) ×2 IMPLANT
NEEDLE HYPO 22GX1.5 SAFETY (NEEDLE) ×2 IMPLANT
NS IRRIG 500ML POUR BTL (IV SOLUTION) ×2 IMPLANT
OBTURATOR OPTICAL STANDARD 8MM (TROCAR) ×1
OBTURATOR OPTICAL STND 8 DVNC (TROCAR) ×1
OBTURATOR OPTICALSTD 8 DVNC (TROCAR) ×1 IMPLANT
PACK LAP CHOLECYSTECTOMY (MISCELLANEOUS) ×2 IMPLANT
PENCIL ELECTRO HAND CTR (MISCELLANEOUS) ×2 IMPLANT
POUCH SPECIMEN RETRIEVAL 10MM (ENDOMECHANICALS) ×2 IMPLANT
SEAL CANN UNIV 5-8 DVNC XI (MISCELLANEOUS) ×4 IMPLANT
SEAL XI 5MM-8MM UNIVERSAL (MISCELLANEOUS) ×4
SOLUTION ELECTROLUBE (MISCELLANEOUS) ×2 IMPLANT
SPONGE LAP 18X18 RF (DISPOSABLE) ×2 IMPLANT
SUT MNCRL AB 4-0 PS2 18 (SUTURE) ×2 IMPLANT
SUT VICRYL 0 AB UR-6 (SUTURE) ×4 IMPLANT
TROCAR 130MM GELPORT  DAV (MISCELLANEOUS) ×2 IMPLANT
TUBING EVAC SMOKE HEATED PNEUM (TUBING) ×2 IMPLANT

## 2019-04-07 NOTE — Anesthesia Preprocedure Evaluation (Deleted)
Anesthesia Evaluation  Patient identified by MRN, date of birth, ID band Patient awake    Reviewed: Allergy & Precautions, H&P , NPO status , Patient's Chart, lab work & pertinent test results  History of Anesthesia Complications Negative for: history of anesthetic complications  Airway Mallampati: III  TM Distance: >3 FB Neck ROM: full    Dental  (+) Chipped, Poor Dentition, Missing, Partial Upper   Pulmonary neg shortness of breath, sleep apnea and Continuous Positive Airway Pressure Ventilation ,           Cardiovascular Exercise Tolerance: Good hypertension, (-) angina(-) Past MI and (-) PND negative cardio ROS  + dysrhythmias      Neuro/Psych  Neuromuscular disease negative psych ROS   GI/Hepatic negative GI ROS, Neg liver ROS, neg GERD  ,  Endo/Other  negative endocrine ROS  Renal/GU Renal disease     Musculoskeletal  (+) Arthritis ,   Abdominal   Peds  Hematology negative hematology ROS (+)   Anesthesia Other Findings Past Medical History: No date: Abnormal Pap smear of cervix     Comment:  Pt states she had colposcopy  No date: Arthritis No date: Chronic kidney disease     Comment:  removed left kidney No date: Dyspnea No date: Dysrhythmia 1979: Hx of unilateral nephrectomy     Comment:  Left Nephrectomy No date: Hypertension No date: Lower extremity edema No date: Palpitations No date: PMB (postmenopausal bleeding) No date: Restless leg syndrome No date: Sleep apnea     Comment:  has no cpap No date: Vitamin D deficiency  Past Surgical History: 02/16/2017: HYSTEROSCOPY W/D&C; N/A     Comment:  Procedure: DILATATION AND CURETTAGE /HYSTEROSCOPY;                Surgeon: Brayton Mars, MD;  Location: [facility] ORS;               Service: Gynecology;  Laterality: N/A; No date: kidney removal; Left 1979: KIDNEY SURGERY; Left     Comment:  left kidney removed 10/21/2017: LEFT HEART CATH AND  CORONARY ANGIOGRAPHY; N/A     Comment:  Procedure: LEFT HEART CATH AND CORONARY ANGIOGRAPHY;                Surgeon: Teodoro Spray, MD;  Location: [place] [facility]              [facility];  Service: Cardiovascular;  Laterality: N/A; 07/06/2017: TOTAL KNEE ARTHROPLASTY; Left     Comment:  Procedure: TOTAL KNEE ARTHROPLASTY;  Surgeon: Lovell Sheehan, MD;  Location: [facility] ORS;  Service: Orthopedics;               Laterality: Left; 1970s: WRIST ARTHROSCOPY; Left  BMI    Body Mass Index: 42.92 kg/m      Reproductive/Obstetrics negative OB ROS                             Anesthesia Physical Anesthesia Plan  ASA: III  Anesthesia Plan: General ETT   Post-op Pain Management:    Induction: Intravenous  PONV Risk Score and Plan: Ondansetron, Dexamethasone, Midazolam and Treatment may vary due to age or medical condition  Airway Management Planned: Oral ETT  Additional Equipment:   Intra-op Plan:   Post-operative Plan: Extubation in OR  Informed Consent: I have reviewed the patients History and Physical, chart,  labs and discussed the procedure including the risks, benefits and alternatives for the proposed anesthesia with the patient or authorized representative who has indicated his/her understanding and acceptance.     Dental Advisory Given  Plan Discussed with: Anesthesiologist, CRNA and Surgeon  Anesthesia Plan Comments: (Patient consented for risks of anesthesia including but not limited to:  - adverse reactions to medications - damage to teeth, lips or other oral mucosa - sore throat or hoarseness - Damage to heart, brain, lungs or loss of life  Patient voiced understanding.)        Anesthesia Quick Evaluation

## 2019-04-07 NOTE — Progress Notes (Signed)
Patient surgery cancelled by Dr Perrin Maltese due to positive result for cocaine from UDS report. Patient informed that Dr Corlis Leak office will give her a follow up call. Patient was directly asked by me if she had done cocaine when she initially arrived. Patient denied use of cocaine and continue to deny cocaine use after positive for cocaine UDS result.

## 2019-04-12 ENCOUNTER — Other Ambulatory Visit: Payer: Medicare Other

## 2019-04-12 ENCOUNTER — Telehealth: Payer: Self-pay | Admitting: Surgery

## 2019-04-12 NOTE — Telephone Encounter (Signed)
Patient's previously scheduled surgery was cancelled due to an abnormal drug screen. Patient has been rescheduled.   Pt advised of pre op date/time and sx date. Sx: 04/26/19 with Dr Kris Mouton assisted laparoscopic cholecystectomy.  Pre op: Patient does not require another pre admit appointment.   Covid testing date is 04/22/19 between 8-10:30am- [facility]. Patient is aware of this date and is able to get transportation.   Patient made aware to call [phone], between 1-3:00pm the day before surgery, to find out what time to arrive.

## 2019-04-13 NOTE — Addendum Note (Signed)
Addended by: Caroleen Hamman F on: 04/13/2019 11:18 AM   Modules accepted: Orders, SmartSet

## 2019-04-22 ENCOUNTER — Other Ambulatory Visit
Admission: RE | Admit: 2019-04-22 | Discharge: 2019-04-22 | Disposition: A | Discharge status: Home / Self Care | Payer: Medicare Other | Source: Ambulatory Visit | Attending: Surgery | Admitting: Surgery

## 2019-04-22 ENCOUNTER — Other Ambulatory Visit: Payer: Self-pay

## 2019-04-22 DIAGNOSIS — Z01812 Encounter for preprocedural laboratory examination: Secondary | ICD-10-CM | POA: Insufficient documentation

## 2019-04-22 DIAGNOSIS — Z20828 Contact with and (suspected) exposure to other viral communicable diseases: Secondary | ICD-10-CM | POA: Diagnosis not present

## 2019-04-22 LAB — SARS CORONAVIRUS 2 (TAT 6-24 HRS): SARS Coronavirus 2: NEGATIVE

## 2019-04-25 ENCOUNTER — Encounter: Payer: Self-pay | Admitting: Anesthesiology

## 2019-04-25 MED ORDER — CEFAZOLIN SODIUM-DEXTROSE 2-4 GM/100ML-% IV SOLN
2.0000 g | INTRAVENOUS | Status: AC
  Administered 2019-04-26: 2 g via INTRAVENOUS

## 2019-04-25 MED ORDER — INDOCYANINE GREEN 25 MG IV SOLR
7.5000 mg | Freq: Once | INTRAVENOUS | Status: AC
  Administered 2019-04-26: 7.5 mg via INTRAVENOUS
  Filled 2019-04-25: qty 25

## 2019-04-26 ENCOUNTER — Ambulatory Visit: Payer: Medicare Other | Admitting: Anesthesiology

## 2019-04-26 ENCOUNTER — Encounter
Admission: RE | Disposition: A | Discharge status: Home / Self Care | Payer: Self-pay | Source: Home / Self Care | Attending: Surgery

## 2019-04-26 ENCOUNTER — Ambulatory Visit
Admission: RE | Admit: 2019-04-26 | Discharge: 2019-04-26 | Disposition: A | Discharge status: Home / Self Care | Payer: Medicare Other | Attending: Surgery | Admitting: Surgery

## 2019-04-26 ENCOUNTER — Other Ambulatory Visit: Payer: Self-pay

## 2019-04-26 ENCOUNTER — Encounter: Payer: Self-pay | Admitting: *Deleted

## 2019-04-26 ENCOUNTER — Ambulatory Visit: Payer: Medicare Other | Admitting: Gastroenterology

## 2019-04-26 DIAGNOSIS — K824 Cholesterolosis of gallbladder: Secondary | ICD-10-CM | POA: Diagnosis not present

## 2019-04-26 DIAGNOSIS — Z8249 Family history of ischemic heart disease and other diseases of the circulatory system: Secondary | ICD-10-CM | POA: Diagnosis not present

## 2019-04-26 DIAGNOSIS — N189 Chronic kidney disease, unspecified: Secondary | ICD-10-CM | POA: Diagnosis not present

## 2019-04-26 DIAGNOSIS — G8929 Other chronic pain: Secondary | ICD-10-CM | POA: Diagnosis not present

## 2019-04-26 DIAGNOSIS — Z96652 Presence of left artificial knee joint: Secondary | ICD-10-CM | POA: Diagnosis not present

## 2019-04-26 DIAGNOSIS — E782 Mixed hyperlipidemia: Secondary | ICD-10-CM | POA: Insufficient documentation

## 2019-04-26 DIAGNOSIS — Z6841 Body Mass Index (BMI) 40.0 and over, adult: Secondary | ICD-10-CM | POA: Diagnosis not present

## 2019-04-26 DIAGNOSIS — I4891 Unspecified atrial fibrillation: Secondary | ICD-10-CM | POA: Diagnosis not present

## 2019-04-26 DIAGNOSIS — Z888 Allergy status to other drugs, medicaments and biological substances status: Secondary | ICD-10-CM | POA: Insufficient documentation

## 2019-04-26 DIAGNOSIS — G2581 Restless legs syndrome: Secondary | ICD-10-CM | POA: Insufficient documentation

## 2019-04-26 DIAGNOSIS — Z79899 Other long term (current) drug therapy: Secondary | ICD-10-CM | POA: Diagnosis not present

## 2019-04-26 DIAGNOSIS — M549 Dorsalgia, unspecified: Secondary | ICD-10-CM | POA: Diagnosis not present

## 2019-04-26 DIAGNOSIS — G4733 Obstructive sleep apnea (adult) (pediatric): Secondary | ICD-10-CM | POA: Diagnosis not present

## 2019-04-26 DIAGNOSIS — K760 Fatty (change of) liver, not elsewhere classified: Secondary | ICD-10-CM | POA: Insufficient documentation

## 2019-04-26 DIAGNOSIS — M170 Bilateral primary osteoarthritis of knee: Secondary | ICD-10-CM | POA: Diagnosis not present

## 2019-04-26 DIAGNOSIS — I10 Essential (primary) hypertension: Secondary | ICD-10-CM | POA: Diagnosis not present

## 2019-04-26 DIAGNOSIS — I129 Hypertensive chronic kidney disease with stage 1 through stage 4 chronic kidney disease, or unspecified chronic kidney disease: Secondary | ICD-10-CM | POA: Diagnosis not present

## 2019-04-26 DIAGNOSIS — G4730 Sleep apnea, unspecified: Secondary | ICD-10-CM | POA: Diagnosis not present

## 2019-04-26 LAB — URINE DRUG SCREEN, QUALITATIVE (ARMC ONLY)
Amphetamines, Ur Screen: NOT DETECTED
Barbiturates, Ur Screen: NOT DETECTED
Benzodiazepine, Ur Scrn: NOT DETECTED
Cannabinoid 50 Ng, Ur ~~LOC~~: POSITIVE — AB
Cocaine Metabolite,Ur ~~LOC~~: NOT DETECTED
MDMA (Ecstasy)Ur Screen: NOT DETECTED
Methadone Scn, Ur: NOT DETECTED
Opiate, Ur Screen: NOT DETECTED
Phencyclidine (PCP) Ur S: NOT DETECTED
Tricyclic, Ur Screen: NOT DETECTED

## 2019-04-26 SURGERY — CHOLECYSTECTOMY, ROBOT-ASSISTED, LAPAROSCOPIC
Anesthesia: General

## 2019-04-26 MED ORDER — PROPOFOL 10 MG/ML IV BOLUS
INTRAVENOUS | Status: DC | PRN
  Administered 2019-04-26: 140 mg via INTRAVENOUS

## 2019-04-26 MED ORDER — FENTANYL CITRATE (PF) 100 MCG/2ML IJ SOLN
INTRAMUSCULAR | Status: DC | PRN
  Administered 2019-04-26 (×2): 50 ug via INTRAVENOUS
  Administered 2019-04-26: 100 ug via INTRAVENOUS

## 2019-04-26 MED ORDER — LACTATED RINGERS IV SOLN
INTRAVENOUS | Status: DC
  Administered 2019-04-26: 07:00:00 via INTRAVENOUS

## 2019-04-26 MED ORDER — LIDOCAINE HCL (CARDIAC) PF 100 MG/5ML IV SOSY
PREFILLED_SYRINGE | INTRAVENOUS | Status: DC | PRN
  Administered 2019-04-26: 100 mg via INTRAVENOUS

## 2019-04-26 MED ORDER — ONDANSETRON HCL 4 MG/2ML IJ SOLN
INTRAMUSCULAR | Status: AC
  Filled 2019-04-26: qty 2

## 2019-04-26 MED ORDER — ACETAMINOPHEN 500 MG PO TABS
1000.0000 mg | ORAL_TABLET | ORAL | Status: AC
  Administered 2019-04-26: 1000 mg via ORAL

## 2019-04-26 MED ORDER — PHENYLEPHRINE HCL (PRESSORS) 10 MG/ML IV SOLN
INTRAVENOUS | Status: DC | PRN
  Administered 2019-04-26: 100 ug via INTRAVENOUS
  Administered 2019-04-26: 200 ug via INTRAVENOUS

## 2019-04-26 MED ORDER — CELECOXIB 200 MG PO CAPS
200.0000 mg | ORAL_CAPSULE | ORAL | Status: AC
  Administered 2019-04-26: 200 mg via ORAL

## 2019-04-26 MED ORDER — CEFAZOLIN SODIUM-DEXTROSE 2-4 GM/100ML-% IV SOLN
INTRAVENOUS | Status: AC
  Filled 2019-04-26: qty 100

## 2019-04-26 MED ORDER — ACETAMINOPHEN 500 MG PO TABS
ORAL_TABLET | ORAL | Status: AC
  Filled 2019-04-26: qty 2

## 2019-04-26 MED ORDER — CHLORHEXIDINE GLUCONATE CLOTH 2 % EX PADS: 6.0000 | MEDICATED_PAD | Freq: Once | CUTANEOUS | Status: DC

## 2019-04-26 MED ORDER — MIDAZOLAM HCL 2 MG/2ML IJ SOLN
INTRAMUSCULAR | Status: AC
  Filled 2019-04-26: qty 2

## 2019-04-26 MED ORDER — GABAPENTIN 300 MG PO CAPS
ORAL_CAPSULE | ORAL | Status: AC
  Filled 2019-04-26: qty 1

## 2019-04-26 MED ORDER — CELECOXIB 200 MG PO CAPS
ORAL_CAPSULE | ORAL | Status: AC
  Filled 2019-04-26: qty 1

## 2019-04-26 MED ORDER — FENTANYL CITRATE (PF) 100 MCG/2ML IJ SOLN
INTRAMUSCULAR | Status: AC
  Filled 2019-04-26: qty 2

## 2019-04-26 MED ORDER — BUPIVACAINE-EPINEPHRINE (PF) 0.25% -1:200000 IJ SOLN
INTRAMUSCULAR | Status: AC
  Filled 2019-04-26: qty 30

## 2019-04-26 MED ORDER — BUPIVACAINE-EPINEPHRINE (PF) 0.25% -1:200000 IJ SOLN
INTRAMUSCULAR | Status: DC | PRN
  Administered 2019-04-26: 30 mL

## 2019-04-26 MED ORDER — CHLORHEXIDINE GLUCONATE CLOTH 2 % EX PADS
6.0000 | MEDICATED_PAD | Freq: Once | CUTANEOUS | Status: AC
  Administered 2019-04-26: 07:00:00 6 via TOPICAL

## 2019-04-26 MED ORDER — EPHEDRINE SULFATE 50 MG/ML IJ SOLN
INTRAMUSCULAR | Status: DC | PRN
  Administered 2019-04-26: 15 mg via INTRAVENOUS

## 2019-04-26 MED ORDER — MIDAZOLAM HCL 2 MG/2ML IJ SOLN
INTRAMUSCULAR | Status: DC | PRN
  Administered 2019-04-26: 2 mg via INTRAVENOUS

## 2019-04-26 MED ORDER — PROPOFOL 10 MG/ML IV BOLUS
INTRAVENOUS | Status: AC
  Filled 2019-04-26: qty 20

## 2019-04-26 MED ORDER — SUGAMMADEX SODIUM 500 MG/5ML IV SOLN
INTRAVENOUS | Status: DC | PRN
  Administered 2019-04-26: 200 mg via INTRAVENOUS

## 2019-04-26 MED ORDER — GABAPENTIN 300 MG PO CAPS
300.0000 mg | ORAL_CAPSULE | ORAL | Status: AC
  Administered 2019-04-26: 300 mg via ORAL

## 2019-04-26 MED ORDER — ONDANSETRON HCL 4 MG/2ML IJ SOLN
4.0000 mg | Freq: Once | INTRAMUSCULAR | Status: AC | PRN
  Administered 2019-04-26: 4 mg via INTRAVENOUS

## 2019-04-26 MED ORDER — ROCURONIUM BROMIDE 100 MG/10ML IV SOLN
INTRAVENOUS | Status: DC | PRN
  Administered 2019-04-26: 50 mg via INTRAVENOUS

## 2019-04-26 MED ORDER — HYDROCODONE-ACETAMINOPHEN 5-325 MG PO TABS: 1.0000 | ORAL_TABLET | ORAL | 0 refills | Status: DC | PRN

## 2019-04-26 MED ORDER — LACTATED RINGERS IV SOLN
INTRAVENOUS | Status: DC | PRN
  Administered 2019-04-26: 07:00:00 via INTRAVENOUS

## 2019-04-26 MED ORDER — SUCCINYLCHOLINE CHLORIDE 20 MG/ML IJ SOLN
INTRAMUSCULAR | Status: DC | PRN
  Administered 2019-04-26: 100 mg via INTRAVENOUS

## 2019-04-26 MED ORDER — DEXAMETHASONE SODIUM PHOSPHATE 10 MG/ML IJ SOLN
INTRAMUSCULAR | Status: DC | PRN
  Administered 2019-04-26: 5 mg via INTRAVENOUS

## 2019-04-26 MED ORDER — FENTANYL CITRATE (PF) 100 MCG/2ML IJ SOLN
25.0000 ug | INTRAMUSCULAR | Status: DC | PRN
  Administered 2019-04-26 (×4): 25 ug via INTRAVENOUS

## 2019-04-26 MED ORDER — ONDANSETRON HCL 4 MG/2ML IJ SOLN
INTRAMUSCULAR | Status: DC | PRN
  Administered 2019-04-26: 4 mg via INTRAVENOUS

## 2019-04-26 MED ORDER — FENTANYL CITRATE (PF) 250 MCG/5ML IJ SOLN
INTRAMUSCULAR | Status: AC
  Filled 2019-04-26: qty 5

## 2019-04-26 SURGICAL SUPPLY — 45 items
"PENCIL ELECTRO HAND CTR " (MISCELLANEOUS) ×2 IMPLANT
CANISTER SUCT 1200ML W/VALVE (MISCELLANEOUS) ×3 IMPLANT
CHLORAPREP W/TINT 26 (MISCELLANEOUS) ×3 IMPLANT
CLIP VESOLOCK MED LG 6/CT (CLIP) ×3 IMPLANT
COVER WAND RF STERILE (DRAPES) ×3 IMPLANT
DECANTER SPIKE VIAL GLASS SM (MISCELLANEOUS) ×3 IMPLANT
DEFOGGER SCOPE WARMER CLEARIFY (MISCELLANEOUS) ×3 IMPLANT
DERMABOND ADVANCED (GAUZE/BANDAGES/DRESSINGS) ×1
DERMABOND ADVANCED .7 DNX12 (GAUZE/BANDAGES/DRESSINGS) ×2 IMPLANT
DRAPE 3/4 80X56 (DRAPES) ×3 IMPLANT
DRAPE ARM DVNC X/XI (DISPOSABLE) ×8 IMPLANT
DRAPE COLUMN DVNC XI (DISPOSABLE) ×2 IMPLANT
DRAPE DA VINCI XI ARM (DISPOSABLE) ×4
DRAPE DA VINCI XI COLUMN (DISPOSABLE) ×1
ELECT CAUTERY BLADE 6.4 (BLADE) ×3 IMPLANT
ELECT REM PT RETURN 9FT ADLT (ELECTROSURGICAL) ×3
ELECTRODE REM PT RTRN 9FT ADLT (ELECTROSURGICAL) ×2 IMPLANT
GLOVE BIO SURGEON STRL SZ7 (GLOVE) ×6 IMPLANT
GOWN STRL REUS W/ TWL LRG LVL3 (GOWN DISPOSABLE) ×8 IMPLANT
GOWN STRL REUS W/TWL LRG LVL3 (GOWN DISPOSABLE) ×4
IRRIGATION STRYKERFLOW (MISCELLANEOUS) IMPLANT
IRRIGATOR STRYKERFLOW (MISCELLANEOUS)
IV NS 1000ML (IV SOLUTION)
IV NS 1000ML BAXH (IV SOLUTION) IMPLANT
KIT IMAGING PINPOINTPAQ (MISCELLANEOUS) ×1 IMPLANT
KIT PINK PAD W/HEAD ARE REST (MISCELLANEOUS) ×3
KIT PINK PAD W/HEAD ARM REST (MISCELLANEOUS) ×2 IMPLANT
LABEL OR SOLS (LABEL) ×3 IMPLANT
NEEDLE HYPO 22GX1.5 SAFETY (NEEDLE) ×3 IMPLANT
NS IRRIG 500ML POUR BTL (IV SOLUTION) ×3 IMPLANT
OBTURATOR OPTICAL STANDARD 8MM (TROCAR) ×1
OBTURATOR OPTICAL STND 8 DVNC (TROCAR) ×2
OBTURATOR OPTICALSTD 8 DVNC (TROCAR) ×2 IMPLANT
PACK LAP CHOLECYSTECTOMY (MISCELLANEOUS) ×3 IMPLANT
PENCIL ELECTRO HAND CTR (MISCELLANEOUS) ×3 IMPLANT
POUCH SPECIMEN RETRIEVAL 10MM (ENDOMECHANICALS) ×3 IMPLANT
SEAL CANN UNIV 5-8 DVNC XI (MISCELLANEOUS) ×8 IMPLANT
SEAL XI 5MM-8MM UNIVERSAL (MISCELLANEOUS) ×4
SOLUTION ELECTROLUBE (MISCELLANEOUS) ×3 IMPLANT
SPONGE LAP 18X18 RF (DISPOSABLE) ×3 IMPLANT
SPONGE LAP 4X18 RFD (DISPOSABLE) ×1 IMPLANT
SUT MNCRL AB 4-0 PS2 18 (SUTURE) ×3 IMPLANT
SUT VICRYL 0 AB UR-6 (SUTURE) ×6 IMPLANT
TROCAR 130MM GELPORT  DAV (MISCELLANEOUS) ×3 IMPLANT
TUBING EVAC SMOKE HEATED PNEUM (TUBING) ×3 IMPLANT

## 2019-04-26 NOTE — Transfer of Care (Signed)
Immediate Anesthesia Transfer of Care Note  Patient: Whitney [name]  Procedure(s) Performed: XI ROBOTIC ASSISTED LAPAROSCOPIC CHOLECYSTECTOMY (N/A ) INDOCYANINE GREEN FLUORESCENCE IMAGING (ICG)  Patient Location: PACU  Anesthesia Type:General  Level of Consciousness: sedated  Airway & Oxygen Therapy: Patient Spontanous Breathing and Patient connected to face mask oxygen  Post-op Assessment: Report given to RN and Post -op Vital signs reviewed and stable  Post vital signs: Reviewed and stable  Last Vitals:  Vitals Value Taken Time  BP    Temp    Pulse 63 04/26/19 0905  Resp 17 04/26/19 0905  SpO2 92 % 04/26/19 0905  Vitals shown include unvalidated device data.  Last Pain:  Vitals:   04/26/19 0609  TempSrc: Tympanic  PainSc: 0-No pain         Complications: No apparent anesthesia complications

## 2019-04-26 NOTE — Discharge Instructions (Addendum)
AMBULATORY SURGERY  DISCHARGE INSTRUCTIONS   1) The drugs that you were given will stay in your system until tomorrow so for the next 24 hours you should not:  A) Drive an automobile B) Make any legal decisions C) Drink any alcoholic beverage   2) You may resume regular meals tomorrow.  Today it is better to start with liquids and gradually work up to solid foods.  You may eat anything you prefer, but it is better to start with liquids, then soup and crackers, and gradually work up to solid foods.   3) Please notify your doctor immediately if you have any unusual bleeding, trouble breathing, redness and pain at the surgery site, drainage, fever, or pain not relieved by medication.    4) Additional Instructions:        Please contact your physician with any problems or Same Day Surgery at [phone], Monday through Friday 6 am to 4 pm, or [place] [facility] number at [phone].Laparoscopic Cholecystectomy, Care After   These instructions give you information on caring for yourself after your procedure. Your doctor may also give you more specific instructions. Call your doctor if you have any problems or questions after your procedure.  HOME CARE  Change your bandages (dressings) as told by your doctor.  Keep the wound dry and clean. Wash the wound gently with soap and water. Pat the wound dry with a clean towel.  Do not take baths, swim, or use hot tubs for 2 weeks, or as told by your doctor.  Only take medicine as told by your doctor.  Eat a normal diet as told by your doctor.  Do not lift anything heavier than 10 pounds (4.5 kg) until your doctor says it is okay.  Do not play contact sports for 1 week, or as told by your doctor. GET HELP IF:  Your wound is red, puffy (swollen), or painful.  You have yellowish-white fluid (pus) coming from the wound.  You have fluid draining from the wound for more than 1 day.  You have a bad smell coming from the wound.   Your wound breaks open. GET HELP RIGHT AWAY IF:  You have trouble breathing.  You have chest pain.  You have a fever >101  You have pain in the shoulders (shoulder strap areas) that is getting worse.  You feel dizzy or pass out (faint).  You have severe belly (abdominal) pain.  You feel sick to your stomach (nauseous) or throw up (vomit) for more than 1 day.   

## 2019-04-26 NOTE — H&P (Signed)
HPI: 68 year old female with multiple medical problems including chronic back pain.  She is seen in consultation at the request of Dr. Vicente Males for a gallbladder polyp.  She reports some abdominal distention and intermittent abdominal pain.  This has been present for several months.  No fevers no chills no evidence of biliary obstruction.  She did have extensive work-up to include a CT scan as well as an MRI that I have personally reviewed showing a  Gallstone on one study and on the most recent one  a 1 cm polyp within the gallbladder.  SHe also had a HIDA scan which was negative.  She reports some nausea as well.  The pain is intermittent and sharp in nature located in the upper abdomen.  No specific aggravating or elevating factors.  No fevers no chills.  Does have a normal INR creatinine of 1.1 and rest of the labs are normal. Her BMI is 42. Dr .Vicente Males will perform an EGD and colonoscopy at some point time. She is cocaine negative today      Past Medical History:  Diagnosis Date  . Abnormal Pap smear of cervix    Pt states she had colposcopy   . Arthritis   . Chronic kidney disease   . Dyspnea   . Dysrhythmia   . Hx of unilateral nephrectomy 1979   Left Nephrectomy  . Hypertension   . Lower extremity edema   . Palpitations   . PMB (postmenopausal bleeding)   . Restless leg syndrome   . Vitamin D deficiency          Past Surgical History:  Procedure Laterality Date  . HYSTEROSCOPY W/D&C N/A 02/16/2017   Procedure: DILATATION AND CURETTAGE /HYSTEROSCOPY;  Surgeon: Defrancesco, Alanda Slim, MD;  Location: [facility] ORS;  Service: Gynecology;  Laterality: N/A;  . kidney removal Left   . KIDNEY SURGERY Left 1979   left kidney removed  . LEFT HEART CATH AND CORONARY ANGIOGRAPHY N/A 10/21/2017   Procedure: LEFT HEART CATH AND CORONARY ANGIOGRAPHY;  Surgeon: Teodoro Spray, MD;  Location: [place] [facility];  Service: Cardiovascular;  Laterality: N/A;  . TOTAL KNEE ARTHROPLASTY  Left 07/06/2017   Procedure: TOTAL KNEE ARTHROPLASTY;  Surgeon: Lovell Sheehan, MD;  Location: [facility] ORS;  Service: Orthopedics;  Laterality: Left;  . WRIST ARTHROSCOPY Left 1970s         Family History  Problem Relation Age of Onset  . Ovarian cancer Mother   . Hypertension Father   . Diabetes Father   . Cancer Father   . Breast cancer Sister   . Diabetes Sister     Social History:  reports that she has never smoked. She has never used smokeless tobacco. She reports current alcohol use of about 2.0 standard drinks of alcohol per week. She reports current drug use. Frequency: 3.00 times per week. Drug: Marijuana.  Allergies:      Allergies  Allergen Reactions  . Enalapril Swelling  . Lisinopril Swelling    Medications reviewed.     ROS Full ROS performed and is otherwise negative other than what is stated in the HPI    BP 140/82   Pulse 80   Temp 97.9 F (36.6 C)   Ht 5\' 5"  (1.651 m)   Wt 257 lb 9.6 oz (116.8 kg)   LMP 01/26/2017 Comment: age 68  SpO2 96%   BMI 42.87 kg/m   Physical Exam Vitals signs and nursing note reviewed. Exam conducted with a chaperone present.  Constitutional:  General: She is not in acute distress.    Appearance: Normal appearance. She is obese. She is not toxic-appearing.  Eyes:     General: No scleral icterus.       Left eye: No discharge.  Neck:     Musculoskeletal: Normal range of motion and neck supple. No neck rigidity or muscular tenderness.  Cardiovascular:     Rate and Rhythm: Normal rate.  Pulmonary:     Effort: Pulmonary effort is normal. No respiratory distress.     Breath sounds: Normal breath sounds. No stridor.  Abdominal:     General: Abdomen is flat. Bowel sounds are normal.     Palpations: Abdomen is soft. There is no mass.     Tenderness: There is abdominal tenderness. There is no rebound.     Hernia: No hernia is present.     Comments: Diffuse upper abdominal tenderness.  No  peritonitis.  No Murphy sign  Musculoskeletal: Normal range of motion.        General: No swelling.  Lymphadenopathy:     Cervical: No cervical adenopathy.  Skin:    General: Skin is warm and dry.     Coloration: Skin is not jaundiced.  Neurological:     General: No focal deficit present.     Mental Status: She is alert and oriented to person, place, and time.  Psychiatric:        Mood and Affect: Mood normal.        Behavior: Behavior normal.        Thought Content: Thought content normal.        Judgment: Judgment normal.      Assessment/Plan: 68 year old female with gallbladder polyp and abdominal pain.  Given the size of the gallbladder polyp I do recommend cholecystectomy.  Whether or not abdominal symptoms may be improved not necessarily certain about this.  The main purpose of the operation will be to decrease the chances of malignancy.  I had an extensive discussion with the patient regarding this specific issue. The risks, benefits, complications, treatment options, and expected outcomes were discussed with the patient. The possibilities of bleeding, recurrent infection, finding a normal gallbladder, perforation of viscus organs, damage to surrounding structures, bile leak, abscess formation, needing a drain placed, the need for additional procedures, reaction to medication, pulmonary aspiration,  failure to diagnose a condition, the possible need to convert to an open procedure, and creating a complication requiring transfusion or operation were discussed with the patient. The patient and/or family concurred with the proposed plan, giving informed consent.   Caroleen Hamman, MD [facility] General Surgeon

## 2019-04-26 NOTE — Anesthesia Procedure Notes (Signed)
Procedure Name: Intubation Date/Time: 04/26/2019 7:42 AM Performed by: Justus Memory, CRNA Pre-anesthesia Checklist: Patient identified, Patient being monitored, Timeout performed, Emergency Drugs available and Suction available Patient Re-evaluated:Patient Re-evaluated prior to induction Oxygen Delivery Method: Circle system utilized Preoxygenation: Pre-oxygenation with 100% oxygen Induction Type: IV induction Ventilation: Mask ventilation without difficulty Laryngoscope Size: Mac, 3 and McGraph Grade View: Grade II Tube type: Oral Tube size: 7.0 mm Number of attempts: 1 Airway Equipment and Method: Stylet Placement Confirmation: ETT inserted through vocal cords under direct vision,  positive ETCO2 and breath sounds checked- equal and bilateral Secured at: 22 cm Tube secured with: Tape Dental Injury: Teeth and Oropharynx as per pre-operative assessment  Difficulty Due To: Difficulty was anticipated, Difficult Airway- due to anterior larynx and Difficult Airway- due to large tongue

## 2019-04-26 NOTE — Anesthesia Post-op Follow-up Note (Signed)
Anesthesia QCDR form completed.        

## 2019-04-26 NOTE — Op Note (Signed)
Robotic assisted laparoscopic Cholecystectomy  Pre-operative Diagnosis: Gallbladder polyp  Post-operative Diagnosis: same  Procedure:  Robotic assisted laparoscopic Cholecystectomy  Surgeon: Caroleen Hamman, MD FACS  Anesthesia: Gen. with endotracheal tube  Findings: Chronic mild Cholecystitis  Large Fatty liver  Estimated Blood Loss: 5cc       Specimens: Gallbladder           Complications: none   Procedure Details  The patient was seen again in the Holding Room. The benefits, complications, treatment options, and expected outcomes were discussed with the patient. The risks of bleeding, infection, recurrence of symptoms, failure to resolve symptoms, bile duct damage, bile duct leak, retained common bile duct stone, bowel injury, any of which could require further surgery and/or ERCP, stent, or papillotomy were reviewed with the patient. The likelihood of improving the patient's symptoms with return to their baseline status is good.  The patient and/or family concurred with the proposed plan, giving informed consent.  The patient was taken to Operating Room, identified  and the procedure verified as Laparoscopic Cholecystectomy.  A Time Out was held and the above information confirmed.  Prior to the induction of general anesthesia, antibiotic prophylaxis was administered. VTE prophylaxis was in place. General endotracheal anesthesia was then administered and tolerated well. After the induction, the abdomen was prepped with Chloraprep and draped in the sterile fashion. The patient was positioned in the supine position.  Cut down technique was used to enter the abdominal cavity and a Hasson trochar was placed after two vicryl stitches were anchored to the fascia. Pneumoperitoneum was then created with CO2 and tolerated well without any adverse changes in the patient's vital signs.  Three 8-mm ports were placed under direct vision. All skin incisions  were infiltrated with a local anesthetic  agent before making the incision and placing the trocars.   The patient was positioned  in reverse Trendelenburg, robot was brought to the surgical field and docked in the standard fashion.  We made sure all the instrumentation was kept indirect view at all times and that there were no collision between the arms. I scrubbed out and went to the console.  The gallbladder was identified, the fundus grasped and retracted cephalad. Adhesions were lysed bluntly. The infundibulum was grasped and retracted laterally, exposing the peritoneum overlying the triangle of Calot. This was then divided and exposed in a blunt fashion. An extended critical view of the cystic duct and cystic artery was obtained.  The cystic duct was clearly identified and bluntly dissected.   Artery and duct were double clipped and divided. Using ICG cholangiography we visualize the cystic duct and so no a Baron biliary ductal anatomy or evidence of bile injuries. The gallbladder was taken from the gallbladder fossa in a retrograde fashion with the electrocautery.  Hemostasis was achieved with the electrocautery. nspection of the right upper quadrant was performed. No bleeding, bile duct injury or leak, or bowel injury was noted. Robotic instruments and robotic arms were undocked in the standard fashion.  I scrubbed back in.  The gallbladder was removed and placed in an Endocatch bag.   Pneumoperitoneum was released.  The periumbilical port site was closed with interrumpted 0 Vicryl sutures. 4-0 subcuticular Monocryl was used to close the skin. Dermabond was  applied.  The patient was then extubated and brought to the recovery room in stable condition. Sponge, lap, and needle counts were correct at closure and at the conclusion of the case.  Caroleen Hamman, MD, FACS

## 2019-04-26 NOTE — Anesthesia Preprocedure Evaluation (Signed)
Anesthesia Evaluation  Patient identified by MRN, date of birth, ID band Patient awake    Reviewed: Allergy & Precautions, NPO status , Patient's Chart, lab work & pertinent test results, reviewed documented beta blocker date and time   Airway Mallampati: III  TM Distance: >3 FB     Dental  (+) Chipped   Pulmonary shortness of breath, sleep apnea ,           Cardiovascular hypertension, Pt. on medications and Pt. on home beta blockers + dysrhythmias Atrial Fibrillation      Neuro/Psych  Neuromuscular disease    GI/Hepatic   Endo/Other    Renal/GU Renal disease     Musculoskeletal  (+) Arthritis ,   Abdominal   Peds  Hematology   Anesthesia Other Findings   Reproductive/Obstetrics                             Anesthesia Physical Anesthesia Plan  ASA: III  Anesthesia Plan: General   Post-op Pain Management:    Induction: Intravenous  PONV Risk Score and Plan:   Airway Management Planned: Oral ETT  Additional Equipment:   Intra-op Plan:   Post-operative Plan:   Informed Consent: I have reviewed the patients History and Physical, chart, labs and discussed the procedure including the risks, benefits and alternatives for the proposed anesthesia with the patient or authorized representative who has indicated his/her understanding and acceptance.       Plan Discussed with: CRNA  Anesthesia Plan Comments:         Anesthesia Quick Evaluation

## 2019-04-27 ENCOUNTER — Telehealth: Payer: Self-pay | Admitting: Surgery

## 2019-04-27 NOTE — Telephone Encounter (Signed)
Telephone Triage Questions    Type of surgery?    Xi robotic assisted laparoscopic cholecystectomy                       Date?    04/26/2019                          Physician?     Dr. Dahlia Byes   Pain ? Pain in the stomach and head  Where and how severe, (1-10) 4  Nausea, vomiting, Fever, chills? Sweats no fever just headaches   Current Antibiotics or Narcotics?  hydrocodone  Patient said she has puffy eyes, stuffy nose and slight pain in the back, and keeps sneezing. Please call patent and advise.

## 2019-04-27 NOTE — Telephone Encounter (Signed)
Spoke with patient. She was instructed to try ibuprofen along with the hydrocodone to see if this helps with  the pain. She will call back if not. Denies fever, chills at this time.

## 2019-04-27 NOTE — Anesthesia Postprocedure Evaluation (Signed)
Anesthesia Post Note  Patient: ASSETOU KALB  Procedure(s) Performed: XI ROBOTIC ASSISTED LAPAROSCOPIC CHOLECYSTECTOMY (N/A ) INDOCYANINE GREEN FLUORESCENCE IMAGING (ICG)  Patient location during evaluation: PACU Anesthesia Type: General Level of consciousness: awake and alert Pain management: pain level controlled Vital Signs Assessment: post-procedure vital signs reviewed and stable Respiratory status: spontaneous breathing, nonlabored ventilation, respiratory function stable and patient connected to nasal cannula oxygen Cardiovascular status: blood pressure returned to baseline and stable Postop Assessment: no apparent nausea or vomiting Anesthetic complications: no     Last Vitals:  Vitals:   04/26/19 1012 04/26/19 1044  BP:  133/79  Pulse: 69 63  Resp:  20  Temp:    SpO2: 97% 96%    Last Pain:  Vitals:   04/27/19 0812  TempSrc:   PainSc: 1                  Truth Barot S

## 2019-04-29 LAB — SURGICAL PATHOLOGY

## 2019-05-03 ENCOUNTER — Encounter: Payer: Self-pay | Admitting: Adult Health

## 2019-05-03 ENCOUNTER — Other Ambulatory Visit: Payer: Self-pay

## 2019-05-03 ENCOUNTER — Ambulatory Visit (INDEPENDENT_AMBULATORY_CARE_PROVIDER_SITE_OTHER): Payer: Medicare Other | Admitting: Adult Health

## 2019-05-03 VITALS — BP 155/89 | HR 89 | Ht 65.0 in | Wt 259.0 lb

## 2019-05-03 DIAGNOSIS — L309 Dermatitis, unspecified: Secondary | ICD-10-CM

## 2019-05-03 DIAGNOSIS — I10 Essential (primary) hypertension: Secondary | ICD-10-CM

## 2019-05-03 MED ORDER — AMOXICILLIN-POT CLAVULANATE 875-125 MG PO TABS: 1.0000 | ORAL_TABLET | Freq: Two times a day (BID) | ORAL | 0 refills | Status: DC

## 2019-05-03 NOTE — Progress Notes (Addendum)
[facility] [place], [place] [address]  Internal MEDICINE  Telephone Visit  Patient Name: Whitney [name]  [id number]  [id number]  Date of Service: 06/10/2019  I connected with the patient at  857 by telephone and verified the patients identity using two identifiers.   I discussed the limitations, risks, security and privacy concerns of performing an evaluation and management service by telephone and the availability of in person appointments. I also discussed with the patient that there may be a patient responsible charge related to the service.  The patient expressed understanding and agrees to proceed.    Chief Complaint  Patient presents with  . Telephone Assessment  . Telephone Screen  . Cough  . Blister    inside and outside a nose   . Sinusitis  . Medical Management of Chronic Issues    sneezing pt had just had surgery for gall bladder     HPI Gall bladder removed week ago.  She reports the day after surgery began to have uncontrollable sneezeing and have discomfort in her nose.  She started getting blisters around her nares on the inside and outside.  She denies any other symptoms at this time.      Current Medication: Outpatient Encounter Medications as of 05/03/2019  Medication Sig  . acetaminophen (TYLENOL) 500 MG tablet Take 1,000 mg by mouth every 6 (six) hours as needed for moderate pain or headache.  Marland Kitchen aspirin EC 81 MG tablet Take 81 mg by mouth daily.  Marland Kitchen atorvastatin (LIPITOR) 40 MG tablet Take 1 tablet (40 mg total) by mouth daily at 6 PM.  . Cholecalciferol (VITAMIN D) 50 MCG (2000 UT) tablet Take 2,000 Units by mouth daily.  . famotidine (PEPCID) 40 MG tablet Take 1 tablet (40 mg total) by mouth daily. (Patient taking differently: Take 40 mg by mouth daily as needed. )  . flecainide (TAMBOCOR) 100 MG tablet Take 100 mg by mouth 2 (two) times daily.  Marland Kitchen gabapentin (NEURONTIN) 100 MG capsule Take 1 capsule (100 mg total) by mouth 2 (two)  times daily. (Patient taking differently: Take 100 mg by mouth 2 (two) times daily as needed (pain). )  . hydrALAZINE (APRESOLINE) 10 MG tablet Take 1 tablet (10 mg total) by mouth 3 (three) times daily.  Marland Kitchen nystatin ointment (MYCOSTATIN) Apply 1 application topically 2 (two) times daily as needed (knee pain).  Marland Kitchen rOPINIRole (REQUIP) 0.5 MG tablet Takes 1 or 2 tablets daily as needed (Patient taking differently: Take 0.5 mg by mouth 2 (two) times daily as needed (restless legs). )  . traMADol (ULTRAM) 50 MG tablet Take 1 tablet (50 mg total) by mouth every 6 (six) hours as needed. (Patient taking differently: Take 50 mg by mouth every 6 (six) hours as needed for moderate pain. )  . traZODone (DESYREL) 50 MG tablet Take 1 tablet (50 mg total) by mouth at bedtime as needed for sleep.  . [DISCONTINUED] carvedilol (COREG) 3.125 MG tablet Take 2 tablets (6.25 mg total) by mouth 2 (two) times daily with a meal. (Patient taking differently: Take 3.125 mg by mouth 2 (two) times daily with a meal. )  . [DISCONTINUED] HYDROcodone-acetaminophen (NORCO/VICODIN) 5-325 MG tablet Take 1-2 tablets by mouth every 4 (four) hours as needed for moderate pain.  . [DISCONTINUED] amoxicillin-clavulanate (AUGMENTIN) 875-125 MG tablet Take 1 tablet by mouth 2 (two) times daily.   Facility-Administered Encounter Medications as of 05/03/2019  Medication  . indocyanine green (IC-GREEN) injection 7.5 mg  Surgical History: Past Surgical History:  Procedure Laterality Date  . CHOLECYSTECTOMY    . HYSTEROSCOPY W/D&C N/A 02/16/2017   Procedure: DILATATION AND CURETTAGE /HYSTEROSCOPY;  Surgeon: Defrancesco, Alanda Slim, MD;  Location: [facility] ORS;  Service: Gynecology;  Laterality: N/A;  . JOINT REPLACEMENT    . kidney removal Left   . KIDNEY SURGERY Left 1979   left kidney removed  . LEFT HEART CATH AND CORONARY ANGIOGRAPHY N/A 10/21/2017   Procedure: LEFT HEART CATH AND CORONARY ANGIOGRAPHY;  Surgeon: Teodoro Spray, MD;   Location: [facility];  Service: Cardiovascular;  Laterality: N/A;  . TOTAL KNEE ARTHROPLASTY Left 07/06/2017   Procedure: TOTAL KNEE ARTHROPLASTY;  Surgeon: Lovell Sheehan, MD;  Location: [facility] ORS;  Service: Orthopedics;  Laterality: Left;  . WRIST ARTHROSCOPY Left 1970s    Medical History: Past Medical History:  Diagnosis Date  . Abnormal Pap smear of cervix    Pt states she had colposcopy   . Arthritis   . Chronic kidney disease    removed left kidney  . Dyspnea   . Dysrhythmia   . Hx of unilateral nephrectomy 1979   Left Nephrectomy  . Hypertension   . Lower extremity edema   . Palpitations   . PMB (postmenopausal bleeding)   . Restless leg syndrome   . Sleep apnea    has no cpap  . Vitamin D deficiency     Family History: Family History  Problem Relation Age of Onset  . Ovarian cancer Mother   . Hypertension Father   . Diabetes Father   . Cancer Father   . Breast cancer Sister   . Diabetes Sister     Social History   Socioeconomic History  . Marital status: Divorced    Spouse name: Not on file  . Number of children: Not on file  . Years of education: Not on file  . Highest education level: Not on file  Occupational History  . Not on file  Social Needs  . Financial resource strain: Not on file  . Food insecurity    Worry: Not on file    Inability: Not on file  . Transportation needs    Medical: Not on file    Non-medical: Not on file  Tobacco Use  . Smoking status: Never Smoker  . Smokeless tobacco: Never Used  Substance and Sexual Activity  . Alcohol use: Yes    Alcohol/week: 2.0 standard drinks    Types: 2 Standard drinks or equivalent per week    Comment: occasionally  . Drug use: Yes    Frequency: 3.0 times per week    Types: Marijuana    Comment: Pt states she uses for leg pain: marijuana; states no longer uses cocaine  . Sexual activity: Yes    Birth control/protection: None  Lifestyle  . Physical activity    Days per week:  Not on file    Minutes per session: Not on file  . Stress: Not on file  Relationships  . Social Herbalist on phone: Not on file    Gets together: Not on file    Attends religious service: Not on file    Active member of club or organization: Not on file    Attends meetings of clubs or organizations: Not on file    Relationship status: Not on file  . Intimate partner violence    Fear of current or ex partner: Not on file    Emotionally abused: Not  on file    Physically abused: Not on file    Forced sexual activity: Not on file  Other Topics Concern  . Not on file  Social History Narrative  . Not on file      Review of Systems  Constitutional: Negative for chills, fatigue and unexpected weight change.  HENT: Negative for congestion, rhinorrhea, sneezing and sore throat.   Eyes: Negative for photophobia, pain and redness.  Respiratory: Negative for cough, chest tightness and shortness of breath.   Cardiovascular: Negative for chest pain and palpitations.  Gastrointestinal: Negative for abdominal pain, constipation, diarrhea, nausea and vomiting.  Endocrine: Negative.   Genitourinary: Negative for dysuria and frequency.  Musculoskeletal: Negative for arthralgias, back pain, joint swelling and neck pain.  Skin: Negative for rash.  Allergic/Immunologic: Negative.   Neurological: Negative for tremors and numbness.  Hematological: Negative for adenopathy. Does not bruise/bleed easily.  Psychiatric/Behavioral: Negative for behavioral problems and sleep disturbance. The patient is not nervous/anxious.     Vital Signs: BP (!) 155/89   Pulse 89   Ht 5\' 5"  (1.651 m)   Wt 259 lb (117.5 kg)   LMP 01/26/2017 Comment: age 24  BMI 43.10 kg/m    Observation/Objective:  Well appearing, does have blisters around nares on camera   Assessment/Plan: 1. Dermatitis Use Augmentin, and continue to use Bactroban as discussed in nares.   2. Essential hypertension Slightly  elevated, continue present management.    3. Morbid obesity ([place]) Obesity Counseling: Risk Assessment: An assessment of behavioral risk factors was made today and includes lack of exercise sedentary lifestyle, lack of portion control and poor dietary habits.  Risk Modification Advice: She was counseled on portion control guidelines. Restricting daily caloric intake to. . The detrimental long term effects of obesity on her health and ongoing poor compliance was also discussed with the patient.  General Counseling: makina shoots understanding of the findings of today's phone visit and agrees with plan of treatment. I have discussed any further diagnostic evaluation that may be needed or ordered today. We also reviewed her medications today. she has been encouraged to call the office with any questions or concerns that should arise related to todays visit.  Meds ordered this encounter  Medications  . DISCONTD: amoxicillin-clavulanate (AUGMENTIN) 875-125 MG tablet    Sig: Take 1 tablet by mouth 2 (two) times daily.    Dispense:  14 tablet    Refill:  0    Time spent: [place] AGNP-C Internal medicine

## 2019-05-09 ENCOUNTER — Encounter: Payer: Medicare Other | Admitting: Physician Assistant

## 2019-05-10 ENCOUNTER — Ambulatory Visit (INDEPENDENT_AMBULATORY_CARE_PROVIDER_SITE_OTHER): Payer: Medicare Other | Admitting: Physician Assistant

## 2019-05-10 ENCOUNTER — Encounter: Payer: Self-pay | Admitting: Physician Assistant

## 2019-05-10 ENCOUNTER — Other Ambulatory Visit: Payer: Self-pay

## 2019-05-10 VITALS — BP 148/90 | HR 89 | Temp 97.2°F | Ht 65.0 in | Wt 258.0 lb

## 2019-05-10 DIAGNOSIS — K824 Cholesterolosis of gallbladder: Secondary | ICD-10-CM

## 2019-05-10 DIAGNOSIS — Z09 Encounter for follow-up examination after completed treatment for conditions other than malignant neoplasm: Secondary | ICD-10-CM

## 2019-05-10 NOTE — Patient Instructions (Signed)
Follow up in 2 weeks  GENERAL POST-OPERATIVE PATIENT INSTRUCTIONS   WOUND CARE INSTRUCTIONS:  Keep a dry clean dressing on the wound if there is drainage. The initial bandage may be removed after 24 hours.  Once the wound has quit draining you may leave it open to air.  If clothing rubs against the wound or causes irritation and the wound is not draining you may cover it with a dry dressing during the daytime.  Try to keep the wound dry and avoid ointments on the wound unless directed to do so.  If the wound becomes bright red and painful or starts to drain infected material that is not clear, please contact your physician immediately.  If the wound is mildly pink and has a thick firm ridge underneath it, this is normal, and is referred to as a healing ridge.  This will resolve over the next 4-6 weeks.  BATHING: You may shower if you have been informed of this by your surgeon. However, Please do not submerge in a tub, hot tub, or pool until incisions are completely sealed or have been told by your surgeon that you may do so.  DIET:  You may eat any foods that you can tolerate.  It is a good idea to eat a high fiber diet and take in plenty of fluids to prevent constipation.  If you do become constipated you may want to take a mild laxative or take ducolax tablets on a daily basis until your bowel habits are regular.  Constipation can be very uncomfortable, along with straining, after recent surgery.  ACTIVITY:  You are encouraged to cough and deep breath or use your incentive spirometer if you were given one, every 15-30 minutes when awake.  This will help prevent respiratory complications and low grade fevers post-operatively if you had a general anesthetic.  You may want to hug a pillow when coughing and sneezing to add additional support to the surgical area, if you had abdominal or chest surgery, which will decrease pain during these times.  You are encouraged to walk and engage in light activity for  the next two weeks.  You should not lift more than 20 pounds for a few more weeks as it could put you at increased risk for complications.  Twenty pounds is roughly equivalent to a plastic bag of groceries. At that time- Listen to your body when lifting, if you have pain when lifting, stop and then try again in a few days. Soreness after doing exercises or activities of daily living is normal as you get back in to your normal routine.  MEDICATIONS:  Try to take narcotic medications and anti-inflammatory medications, such as tylenol, ibuprofen, naprosyn, etc., with food.  This will minimize stomach upset from the medication.  Should you develop nausea and vomiting from the pain medication, or develop a rash, please discontinue the medication and contact your physician.  You should not drive, make important decisions, or operate machinery when taking narcotic pain medication.  SUNBLOCK Use sun block to incision area over the next year if this area will be exposed to sun. This helps decrease scarring and will allow you avoid a permanent darkened area over your incision.  QUESTIONS:  Please feel free to call our office if you have any questions, and we will be glad to assist you. [phone].

## 2019-05-10 NOTE — Progress Notes (Signed)
[facility] POST-OP OFFICE VISIT  05/10/2019  HPI: Whitney [name] is a 68 y.o. female 14 days s/p robotic-assisted laparoscopic cholecystectomy with Dr Dahlia Byes for gallbladder polyp.  Today, she reports that overall she is doing well. Her pain has been well controlled and not needing narcotics. No fever, chills, nausea, emesis, or bowel changes. Tolerating PO. She is worried about the incision superior to her umbilicus secondary to swelling and "bulging." No other issues.     Vital signs: BP (!) 148/90   Pulse 89   Temp (!) 97.2 F (36.2 C)   Ht 5\' 5"  (1.651 m)   Wt 258 lb (117 kg)   LMP 01/26/2017 Comment: age 71  BMI 42.93 kg/m    Physical Exam: Constitutional: Well appearing female, NAD Abdomen: Soft, there is significant swelling at the incision superior to umbilicus difficult to distinguish between induration and question of hernia, no defect appreciable, non tender, non-distended. Skin: Laparoscopic incision are CDI with erythema or drainage.   Assessment/Plan: This is a 68 y.o. female 14 days s/p robotic-assisted laparoscopic cholecystectomy   - Pain control prn  - Okay to shower  - complete lifting restrictions; light activity okay  - reviewed pathology  - will reassess in 2 weeks to allow inflammation/swelling to subside and further evaluate for possibility of incisional hernia  -- Edison Simon, PA-C [facility] 05/10/2019, 9:09 AM [phone] M-F: 7am - 4pm

## 2019-05-17 ENCOUNTER — Ambulatory Visit (INDEPENDENT_AMBULATORY_CARE_PROVIDER_SITE_OTHER): Payer: Medicare Other | Admitting: Adult Health

## 2019-05-17 ENCOUNTER — Other Ambulatory Visit: Payer: Self-pay

## 2019-05-17 ENCOUNTER — Encounter: Payer: Self-pay | Admitting: Internal Medicine

## 2019-05-17 VITALS — BP 140/88 | HR 93 | Temp 97.5°F | Resp 16 | Ht 65.0 in | Wt 261.0 lb

## 2019-05-17 DIAGNOSIS — J301 Allergic rhinitis due to pollen: Secondary | ICD-10-CM

## 2019-05-17 DIAGNOSIS — R040 Epistaxis: Secondary | ICD-10-CM

## 2019-05-17 DIAGNOSIS — H25011 Cortical age-related cataract, right eye: Secondary | ICD-10-CM | POA: Diagnosis not present

## 2019-05-17 DIAGNOSIS — H25012 Cortical age-related cataract, left eye: Secondary | ICD-10-CM | POA: Diagnosis not present

## 2019-05-17 NOTE — Progress Notes (Addendum)
[facility] [place],  [address]  Internal MEDICINE  Office Visit Note  Patient Name: Whitney [name]  [id number]  [id number]  Date of Service: 06/15/2019  Chief Complaint  Patient presents with  . Epistaxis  . Headache     HPI Pt is here for a sick visit. Pt reports she had intermittent nose bleed on two occasions over the last two days.  She also reports headaches considered with the nose bleeds. Even event lasted no more than 2 minutes per patient.  She did not check her blood pressure at the time, but states the "felt like it was high".  Her bp is 140/88 at this time. She denies dizziness, or chest pain.  She also reports history of cataracts in both her eyes, that she needs opthomology referral for.     Current Medication:  Outpatient Encounter Medications as of 05/17/2019  Medication Sig  . acetaminophen (TYLENOL) 500 MG tablet Take 1,000 mg by mouth every 6 (six) hours as needed for moderate pain or headache.  Marland Kitchen aspirin EC 81 MG tablet Take 81 mg by mouth daily.  Marland Kitchen atorvastatin (LIPITOR) 40 MG tablet Take 1 tablet (40 mg total) by mouth daily at 6 PM.  . Cholecalciferol (VITAMIN D) 50 MCG (2000 UT) tablet Take 2,000 Units by mouth daily.  . famotidine (PEPCID) 40 MG tablet Take 1 tablet (40 mg total) by mouth daily. (Patient taking differently: Take 40 mg by mouth daily as needed. )  . flecainide (TAMBOCOR) 100 MG tablet Take 100 mg by mouth 2 (two) times daily.  Marland Kitchen gabapentin (NEURONTIN) 100 MG capsule Take 1 capsule (100 mg total) by mouth 2 (two) times daily. (Patient taking differently: Take 100 mg by mouth 2 (two) times daily as needed (pain). )  . hydrALAZINE (APRESOLINE) 10 MG tablet Take 1 tablet (10 mg total) by mouth 3 (three) times daily.  Marland Kitchen nystatin ointment (MYCOSTATIN) Apply 1 application topically 2 (two) times daily as needed (knee pain).  Marland Kitchen rOPINIRole (REQUIP) 0.5 MG tablet Takes 1 or 2 tablets daily as needed (Patient taking  differently: Take 0.5 mg by mouth 2 (two) times daily as needed (restless legs). )  . traMADol (ULTRAM) 50 MG tablet Take 1 tablet (50 mg total) by mouth every 6 (six) hours as needed. (Patient taking differently: Take 50 mg by mouth every 6 (six) hours as needed for moderate pain. )  . traZODone (DESYREL) 50 MG tablet Take 1 tablet (50 mg total) by mouth at bedtime as needed for sleep.  . [DISCONTINUED] amoxicillin-clavulanate (AUGMENTIN) 875-125 MG tablet Take 1 tablet by mouth 2 (two) times daily.  . [DISCONTINUED] carvedilol (COREG) 3.125 MG tablet Take 2 tablets (6.25 mg total) by mouth 2 (two) times daily with a meal. (Patient taking differently: Take 3.125 mg by mouth 2 (two) times daily with a meal. )   Facility-Administered Encounter Medications as of 05/17/2019  Medication  . indocyanine green (IC-GREEN) injection 7.5 mg      Medical History: Past Medical History:  Diagnosis Date  . Abnormal Pap smear of cervix    Pt states she had colposcopy   . Arthritis   . Chronic kidney disease    removed left kidney  . Dyspnea   . Dysrhythmia   . Hx of unilateral nephrectomy 1979   Left Nephrectomy  . Hypertension   . Lower extremity edema   . Palpitations   . PMB (postmenopausal bleeding)   . Restless leg syndrome   .  Sleep apnea    has no cpap  . Vitamin D deficiency      Vital Signs: BP 140/88   Pulse 93   Temp (!) 97.5 F (36.4 C)   Resp 16   Ht 5\' 5"  (1.651 m)   Wt 261 lb (118.4 kg)   LMP 01/26/2017 Comment: age 68  SpO2 96%   BMI 43.43 kg/m    Review of Systems  Constitutional: Negative for chills, fatigue and unexpected weight change.  HENT: Negative for congestion, rhinorrhea, sneezing and sore throat.   Eyes: Negative for photophobia, pain and redness.  Respiratory: Negative for cough, chest tightness and shortness of breath.   Cardiovascular: Negative for chest pain and palpitations.  Gastrointestinal: Negative for abdominal pain, constipation,  diarrhea, nausea and vomiting.  Endocrine: Negative.   Genitourinary: Negative for dysuria and frequency.  Musculoskeletal: Negative for arthralgias, back pain, joint swelling and neck pain.  Skin: Negative for rash.  Allergic/Immunologic: Negative.   Neurological: Negative for tremors and numbness.  Hematological: Negative for adenopathy. Does not bruise/bleed easily.  Psychiatric/Behavioral: Negative for behavioral problems and sleep disturbance. The patient is not nervous/anxious.     Physical Exam Vitals signs and nursing note reviewed.  Constitutional:      General: She is not in acute distress.    Appearance: She is well-developed. She is not diaphoretic.  HENT:     Head: Normocephalic and atraumatic.     Mouth/Throat:     Pharynx: No oropharyngeal exudate.  Eyes:     Pupils: Pupils are equal, round, and reactive to light.  Neck:     Musculoskeletal: Normal range of motion and neck supple.     Thyroid: No thyromegaly.     Vascular: No JVD.     Trachea: No tracheal deviation.  Cardiovascular:     Rate and Rhythm: Normal rate and regular rhythm.     Heart sounds: Normal heart sounds. No murmur. No friction rub. No gallop.   Pulmonary:     Effort: Pulmonary effort is normal. No respiratory distress.     Breath sounds: Normal breath sounds. No wheezing or rales.  Chest:     Chest wall: No tenderness.  Abdominal:     Palpations: Abdomen is soft.     Tenderness: There is no abdominal tenderness. There is no guarding.  Musculoskeletal: Normal range of motion.  Lymphadenopathy:     Cervical: No cervical adenopathy.  Skin:    General: Skin is warm and dry.  Neurological:     Mental Status: She is alert and oriented to person, place, and time.     Cranial Nerves: No cranial nerve deficit.  Psychiatric:        Behavior: Behavior normal.        Thought Content: Thought content normal.        Judgment: Judgment normal.    Assessment/Plan: 1. Allergic rhinitis due to  pollen, unspecified seasonality Stable, continue present management.   2. Bleeding nose Resolved, could be due to HTN. Pt will return if anymore episodes, and will check bp as discussed. Increase coreg on next visit   3. Cortical age-related cataract of left eye - Ambulatory referral to Optometry/opthalmology  4. Cortical age-related cataract of right eye - Ambulatory referral to Optometry/ophthalmology   General Counseling: daysi cayce understanding of the findings of todays visit and agrees with plan of treatment. I have discussed any further diagnostic evaluation that may be needed or ordered today. We also reviewed her medications today. she  has been encouraged to call the office with any questions or concerns that should arise related to todays visit.   Orders Placed This Encounter  Procedures  . Ambulatory referral to Optometry  . Ambulatory referral to Ophthalmology    No orders of the defined types were placed in this encounter.   Time spent: 25 Minutes  This patient was seen by Orson Gear AGNP-C in Collaboration with Dr Lavera Guise as a part of collaborative care agreement.  Kendell Bane AGNP-C Internal Medicine

## 2019-05-24 ENCOUNTER — Other Ambulatory Visit: Payer: Self-pay

## 2019-05-24 ENCOUNTER — Encounter: Payer: Self-pay | Admitting: Physician Assistant

## 2019-05-24 ENCOUNTER — Ambulatory Visit: Payer: Medicare Other

## 2019-05-24 ENCOUNTER — Other Ambulatory Visit
Admission: RE | Admit: 2019-05-24 | Discharge: 2019-05-24 | Disposition: A | Discharge status: Home / Self Care | Payer: Medicare Other | Source: Ambulatory Visit | Attending: Physician Assistant | Admitting: Physician Assistant

## 2019-05-24 ENCOUNTER — Ambulatory Visit (INDEPENDENT_AMBULATORY_CARE_PROVIDER_SITE_OTHER): Payer: Medicare Other | Admitting: Physician Assistant

## 2019-05-24 VITALS — BP 169/110 | HR 69 | Temp 95.5°F | Ht 65.0 in | Wt 259.2 lb

## 2019-05-24 DIAGNOSIS — Z09 Encounter for follow-up examination after completed treatment for conditions other than malignant neoplasm: Secondary | ICD-10-CM

## 2019-05-24 DIAGNOSIS — K439 Ventral hernia without obstruction or gangrene: Secondary | ICD-10-CM

## 2019-05-24 DIAGNOSIS — K824 Cholesterolosis of gallbladder: Secondary | ICD-10-CM | POA: Insufficient documentation

## 2019-05-24 LAB — CREATININE, SERUM
Creatinine, Ser: 1.2 mg/dL — ABNORMAL HIGH (ref 0.44–1.00)
GFR calc Af Amer: 54 mL/min — ABNORMAL LOW (ref >60)
GFR calc non Af Amer: 46 mL/min — ABNORMAL LOW (ref >60)

## 2019-05-24 NOTE — Addendum Note (Signed)
Addended by: Irena Reichmann on: 05/24/2019 10:01 AM   Modules accepted: Orders

## 2019-05-24 NOTE — Patient Instructions (Addendum)
CT scheduled at [facility]patient [facility] at 10:45 am on 05/27/19. Please do not eat or drink 4 hours prior.   Please go to the lab when leaving our office for lab work. Please stop at the radiology desk and pick up your prep and instruction sheet.   Otho Ket will contact patient for follow up appointment to discuss CT results.

## 2019-05-24 NOTE — Progress Notes (Addendum)
[facility] POST-OP OFFICE VISIT  05/24/2019  HPI: Whitney [name] is a 68 y.o. female 4 weeks s/p laparoscopic cholecystectomy with Dr Dahlia Byes.   Today, she reports she continues to have swelling around her supra-umbilical incision. The area is sore. No issues with fever, nausea, or emesis. She does feel it has gotten smaller.   Vital signs: BP (!) 169/110   Pulse 69   Temp (!) 95.5 F (35.3 C) (Temporal)   Ht 5\' 5"  (1.651 m)   Wt 259 lb 3.2 oz (117.6 kg)   LMP 01/26/2017 Comment: age 25  SpO2 98%   BMI 43.13 kg/m    Physical Exam: Well appearing female, NAD Abdomen: Soft, there is improved swelling at the incision superior to umbilicus, difficult to distinguish between induration and hernia, tender to palpation Skin: Laparoscopic incision are well healed  Assessment/Plan: This is a 68 y.o. female 4 weeks s/p laparoscopic cholecystectomy with Dr Dahlia Byes.    - pain control prn  - okay to submerge wounds  - Will get CT abdomen/pelvis to evaluate for hernia  - Continue lifting restrictions  - Follow up pending CT results  -- Edison Simon, PA-C [facility] 05/24/2019, 9:24 AM [phone] M-F: 7am - 4pm

## 2019-05-27 ENCOUNTER — Other Ambulatory Visit: Payer: Self-pay

## 2019-05-27 ENCOUNTER — Ambulatory Visit
Admission: RE | Admit: 2019-05-27 | Discharge: 2019-05-27 | Disposition: A | Discharge status: Home / Self Care | Payer: Medicare Other | Source: Ambulatory Visit | Attending: Physician Assistant | Admitting: Physician Assistant

## 2019-05-27 DIAGNOSIS — Z09 Encounter for follow-up examination after completed treatment for conditions other than malignant neoplasm: Secondary | ICD-10-CM | POA: Insufficient documentation

## 2019-05-27 DIAGNOSIS — K824 Cholesterolosis of gallbladder: Secondary | ICD-10-CM | POA: Insufficient documentation

## 2019-05-27 DIAGNOSIS — K439 Ventral hernia without obstruction or gangrene: Secondary | ICD-10-CM | POA: Insufficient documentation

## 2019-05-27 DIAGNOSIS — K429 Umbilical hernia without obstruction or gangrene: Secondary | ICD-10-CM | POA: Diagnosis not present

## 2019-05-27 MED ORDER — IOHEXOL 300 MG/ML  SOLN
100.0000 mL | Freq: Once | INTRAMUSCULAR | Status: AC | PRN
  Administered 2019-05-27: 80 mL via INTRAVENOUS

## 2019-05-30 ENCOUNTER — Telehealth: Payer: Self-pay

## 2019-05-30 ENCOUNTER — Other Ambulatory Visit: Payer: Self-pay

## 2019-05-30 DIAGNOSIS — R1013 Epigastric pain: Secondary | ICD-10-CM

## 2019-05-30 MED ORDER — NA SULFATE-K SULFATE-MG SULF 17.5-3.13-1.6 GM/177ML PO SOLN: 1.0000 | Freq: Once | ORAL | 0 refills | Status: AC

## 2019-05-30 NOTE — Telephone Encounter (Signed)
Patient notified of Ct scan and appointment 06/08/2019 at 11 am made with Dr.Pabon.

## 2019-05-30 NOTE — Telephone Encounter (Signed)
Patient called stating she is scheduled with our office for 06/08/19 at 11:00am. Patient states she is scheduled for an colonoscopy and has to have a COVID test done before she can have the procedure done. Patient asked if it would be possible for [place] to contact the courtesy car to take her to get her testing after her appointment with our office. Per [place], informed me to notify the patient that would should be able to do that for her. Patient verbalizes understanding.

## 2019-06-01 ENCOUNTER — Other Ambulatory Visit: Payer: Self-pay

## 2019-06-01 ENCOUNTER — Telehealth: Payer: Self-pay | Admitting: Gastroenterology

## 2019-06-01 DIAGNOSIS — I10 Essential (primary) hypertension: Secondary | ICD-10-CM

## 2019-06-01 MED ORDER — CARVEDILOL 3.125 MG PO TABS: 6.2500 mg | ORAL_TABLET | Freq: Two times a day (BID) | ORAL | 3 refills | Status: DC

## 2019-06-01 NOTE — Telephone Encounter (Signed)
Spoke with pt regarding her procedure and was able to answer her questions.

## 2019-06-01 NOTE — Telephone Encounter (Signed)
Pt left vm she needs someone to call her regarding her procedure 06/13/19

## 2019-06-08 ENCOUNTER — Other Ambulatory Visit
Admission: RE | Admit: 2019-06-08 | Discharge: 2019-06-08 | Disposition: A | Discharge status: Home / Self Care | Payer: Medicare Other | Source: Ambulatory Visit | Attending: Gastroenterology | Admitting: Gastroenterology

## 2019-06-08 ENCOUNTER — Ambulatory Visit (INDEPENDENT_AMBULATORY_CARE_PROVIDER_SITE_OTHER): Payer: Medicare Other | Admitting: Surgery

## 2019-06-08 ENCOUNTER — Other Ambulatory Visit: Payer: Self-pay

## 2019-06-08 ENCOUNTER — Encounter: Payer: Self-pay | Admitting: Surgery

## 2019-06-08 VITALS — BP 161/102 | HR 70 | Temp 97.7°F | Resp 12 | Ht 65.0 in | Wt 260.2 lb

## 2019-06-08 DIAGNOSIS — Z01812 Encounter for preprocedural laboratory examination: Secondary | ICD-10-CM | POA: Diagnosis not present

## 2019-06-08 DIAGNOSIS — Z20828 Contact with and (suspected) exposure to other viral communicable diseases: Secondary | ICD-10-CM | POA: Diagnosis not present

## 2019-06-08 DIAGNOSIS — K432 Incisional hernia without obstruction or gangrene: Secondary | ICD-10-CM | POA: Diagnosis not present

## 2019-06-08 LAB — SARS CORONAVIRUS 2 (TAT 6-24 HRS): SARS Coronavirus 2: NEGATIVE

## 2019-06-08 NOTE — Patient Instructions (Addendum)
Follow up here in 2 months to discuss and schedule surgery for your hernia.    Hernia, Adult     A hernia happens when tissue inside your body pushes out through a weak spot in your belly muscles (abdominal wall). This makes a round lump (bulge). The lump may be:  In a scar from surgery that was done in your belly (incisional hernia).  Near your belly button (umbilical hernia).  In your groin (inguinal hernia). Your groin is the area where your leg meets your lower belly (abdomen). This kind of hernia could also be: ? In your scrotum, if you are female. ? In folds of skin around your vagina, if you are female.  In your upper thigh (femoral hernia).  Inside your belly (hiatal hernia). This happens when your stomach slides above the muscle between your belly and your chest (diaphragm). If your hernia is small and it does not cause pain, you may not need treatment. If your hernia is large or it causes pain, you may need surgery. Follow these instructions at home: Activity  Avoid stretching or overusing (straining) the muscles near your hernia. Straining can happen when you: ? Lift something heavy. ? Poop (have a bowel movement).  Do not lift anything that is heavier than 10 lb (4.5 kg), or the limit that you are told, until your doctor says that it is safe.  Use the strength of your legs when you lift something heavy. Do not use only your back muscles to lift. General instructions  Do these things if told by your doctor so you do not have trouble pooping (constipation): ? Drink enough fluid to keep your pee (urine) pale yellow. ? Eat foods that are high in fiber. These include fresh fruits and vegetables, whole grains, and beans. ? Limit foods that are high in fat and processed sugars. These include foods that are fried or sweet. ? Take medicine for trouble pooping.  When you cough, try to cough gently.  You may try to push your hernia in by very gently pressing on it when you  are lying down. Do not try to force the bulge back in if it will not push in easily.  If you are overweight, work with your doctor to lose weight safely.  Do not use any products that have nicotine or tobacco in them. These include cigarettes and e-cigarettes. If you need help quitting, ask your doctor.  If you will be having surgery (hernia repair), watch your hernia for changes in shape, size, or color. Tell your doctor if you see any changes.  Take over-the-counter and prescription medicines only as told by your doctor.  Keep all follow-up visits as told by your doctor. Contact a doctor if:  You get new pain, swelling, or redness near your hernia.  You poop fewer times in a week than normal.  You have trouble pooping.  You have poop (stool) that is more dry than normal.  You have poop that is harder or larger than normal. Get help right away if:  You have a fever.  You have belly pain that gets worse.  You feel sick to your stomach (nauseous).  You throw up (vomit).  Your hernia cannot be pushed in by very gently pressing on it when you are lying down. Do not try to force the bulge back in if it will not push in easily.  Your hernia: ? Changes in shape or size. ? Changes color. ? Feels hard or it hurts  when you touch it. These symptoms may represent a serious problem that is an emergency. Do not wait to see if the symptoms will go away. Get medical help right away. Call your local emergency services (911 in the [place] Summary  A hernia happens when tissue inside your body pushes out through a weak spot in the belly muscles. This creates a bulge.  If your hernia is small and it does not hurt, you may not need treatment. If your hernia is large or it hurts, you may need surgery.  If you will be having surgery, watch your hernia for changes in shape, size, or color. Tell your doctor about any changes. This information is not intended to replace advice given to you by  your health care provider. Make sure you discuss any questions you have with your health care provider. Document Released: 01/08/2010 Document Revised: 11/11/2018 Document Reviewed: 04/22/2017 Elsevier Patient Education  2020 [employer].

## 2019-06-09 ENCOUNTER — Other Ambulatory Visit: Payer: Medicare Other

## 2019-06-10 ENCOUNTER — Encounter: Payer: Self-pay | Admitting: *Deleted

## 2019-06-10 NOTE — OR Nursing (Signed)
Pt.was notified pre-op that she would need someone with her to leave by [facility].She was made aware that our policy was that she could not have her test done if she did not have a person with her.

## 2019-06-13 ENCOUNTER — Encounter: Payer: Self-pay | Admitting: Surgery

## 2019-06-13 ENCOUNTER — Encounter: Payer: Self-pay | Admitting: Anesthesiology

## 2019-06-13 ENCOUNTER — Telehealth: Payer: Self-pay | Admitting: Gastroenterology

## 2019-06-13 ENCOUNTER — Other Ambulatory Visit: Payer: Self-pay

## 2019-06-13 ENCOUNTER — Ambulatory Visit
Admission: RE | Admit: 2019-06-13 | Discharge: 2019-06-13 | Disposition: A | Discharge status: Home / Self Care | Payer: Medicare Other | Attending: Gastroenterology | Admitting: Gastroenterology

## 2019-06-13 ENCOUNTER — Ambulatory Visit: Payer: Medicare Other | Admitting: Anesthesiology

## 2019-06-13 ENCOUNTER — Encounter
Admission: RE | Disposition: A | Discharge status: Home / Self Care | Payer: Self-pay | Source: Home / Self Care | Attending: Gastroenterology

## 2019-06-13 DIAGNOSIS — K449 Diaphragmatic hernia without obstruction or gangrene: Secondary | ICD-10-CM | POA: Diagnosis not present

## 2019-06-13 DIAGNOSIS — K2950 Unspecified chronic gastritis without bleeding: Secondary | ICD-10-CM | POA: Diagnosis not present

## 2019-06-13 DIAGNOSIS — N189 Chronic kidney disease, unspecified: Secondary | ICD-10-CM | POA: Diagnosis not present

## 2019-06-13 DIAGNOSIS — I129 Hypertensive chronic kidney disease with stage 1 through stage 4 chronic kidney disease, or unspecified chronic kidney disease: Secondary | ICD-10-CM | POA: Diagnosis not present

## 2019-06-13 DIAGNOSIS — K228 Other specified diseases of esophagus: Secondary | ICD-10-CM | POA: Diagnosis not present

## 2019-06-13 DIAGNOSIS — Z96652 Presence of left artificial knee joint: Secondary | ICD-10-CM | POA: Insufficient documentation

## 2019-06-13 DIAGNOSIS — Z6841 Body Mass Index (BMI) 40.0 and over, adult: Secondary | ICD-10-CM | POA: Diagnosis not present

## 2019-06-13 DIAGNOSIS — R1013 Epigastric pain: Secondary | ICD-10-CM

## 2019-06-13 DIAGNOSIS — R109 Unspecified abdominal pain: Secondary | ICD-10-CM | POA: Insufficient documentation

## 2019-06-13 DIAGNOSIS — Z905 Acquired absence of kidney: Secondary | ICD-10-CM | POA: Insufficient documentation

## 2019-06-13 DIAGNOSIS — G4730 Sleep apnea, unspecified: Secondary | ICD-10-CM | POA: Insufficient documentation

## 2019-06-13 DIAGNOSIS — G4733 Obstructive sleep apnea (adult) (pediatric): Secondary | ICD-10-CM | POA: Diagnosis not present

## 2019-06-13 DIAGNOSIS — G2581 Restless legs syndrome: Secondary | ICD-10-CM | POA: Diagnosis not present

## 2019-06-13 DIAGNOSIS — Z7982 Long term (current) use of aspirin: Secondary | ICD-10-CM | POA: Insufficient documentation

## 2019-06-13 DIAGNOSIS — K2970 Gastritis, unspecified, without bleeding: Secondary | ICD-10-CM | POA: Diagnosis not present

## 2019-06-13 DIAGNOSIS — Z79899 Other long term (current) drug therapy: Secondary | ICD-10-CM | POA: Insufficient documentation

## 2019-06-13 DIAGNOSIS — I10 Essential (primary) hypertension: Secondary | ICD-10-CM | POA: Diagnosis not present

## 2019-06-13 DIAGNOSIS — B9681 Helicobacter pylori [H. pylori] as the cause of diseases classified elsewhere: Secondary | ICD-10-CM | POA: Diagnosis not present

## 2019-06-13 HISTORY — PX: COLONOSCOPY WITH PROPOFOL: SHX5780

## 2019-06-13 HISTORY — PX: ESOPHAGOGASTRODUODENOSCOPY (EGD) WITH PROPOFOL: SHX5813

## 2019-06-13 SURGERY — COLONOSCOPY WITH PROPOFOL
Anesthesia: General

## 2019-06-13 MED ORDER — SODIUM CHLORIDE 0.9 % IV SOLN
INTRAVENOUS | Status: DC
  Administered 2019-06-13: 1000 mL via INTRAVENOUS

## 2019-06-13 MED ORDER — PROPOFOL 10 MG/ML IV BOLUS
INTRAVENOUS | Status: AC
  Filled 2019-06-13: qty 20

## 2019-06-13 MED ORDER — PROPOFOL 500 MG/50ML IV EMUL
INTRAVENOUS | Status: AC
  Filled 2019-06-13: qty 50

## 2019-06-13 MED ORDER — LIDOCAINE HCL (PF) 1 % IJ SOLN
INTRAMUSCULAR | Status: AC
  Filled 2019-06-13: qty 2

## 2019-06-13 MED ORDER — PROPOFOL 10 MG/ML IV BOLUS
INTRAVENOUS | Status: DC | PRN
  Administered 2019-06-13 (×4): 50 mg via INTRAVENOUS

## 2019-06-13 MED ORDER — PROPOFOL 500 MG/50ML IV EMUL
INTRAVENOUS | Status: DC | PRN
  Administered 2019-06-13: 100 ug/kg/min via INTRAVENOUS

## 2019-06-13 NOTE — Op Note (Signed)
[facility] Gastroenterology Patient Name: Curtis Milian Procedure Date: 06/13/2019 8:43 AM MRN: [id number] Account #: [id number] Date of Birth: 1950-10-10 Admit Type: Outpatient Age: 68 Room: [facility] ENDO ROOM 4 Gender: Female Note Status: Finalized Procedure:             Upper GI endoscopy Indications:           Abdominal pain Providers:             [name] MD, MD Medicines:             Monitored Anesthesia Care Complications:         No immediate complications. Procedure:             Pre-Anesthesia Assessment:                        - Prior to the procedure, a History and Physical was                         performed, and patient medications, allergies and                         sensitivities were reviewed. The patient's tolerance                         of previous anesthesia was reviewed.                        - The risks and benefits of the procedure and the                         sedation options and risks were discussed with the                         patient. All questions were answered and informed                         consent was obtained.                        - ASA Grade Assessment: III - A patient with severe                         systemic disease.                        After obtaining informed consent, the endoscope was                         passed under direct vision. Throughout the procedure,                         the patient's blood pressure, pulse, and oxygen                         saturations were monitored continuously. The Endoscope                         was introduced through the mouth, and advanced to the  third part of duodenum. The upper GI endoscopy was                         accomplished with ease. The patient tolerated the                         procedure well. Findings:      The examined duodenum was normal.      A medium-sized hiatal hernia was present.      The entire examined stomach  was normal. Biopsies were taken with a cold       forceps for histology.      2 [Size of Nodule(s)] [Mucosal Involvement] nodules [Extent] were found       at the gastroesophageal junction.      The cardia and gastric fundus were normal on retroflexion. Impression:            - Normal examined duodenum.                        - Medium-sized hiatal hernia.                        - Normal stomach. Biopsied.                        - Mucosal nodule found in the esophagus. Recommendation:        - Await pathology results.                        - Refer to Dr Rush Landmark or Dr Ardis Hughs for EUS+/- EMR                         of nodules at [place] junction Procedure Code(s):     --- Professional ---                        [phone], Esophagogastroduodenoscopy, flexible,                         transoral; with biopsy, single or multiple Diagnosis Code(s):     --- Professional ---                        K44.9, Diaphragmatic hernia without obstruction or                         gangrene                        K22.8, Other specified diseases of esophagus                        R10.9, Unspecified abdominal pain CPT copyright 2019 American Medical Association. All rights reserved. The codes documented in this report are preliminary and upon coder review may  be revised to meet current compliance requirements. [name], MD [name] MD, MD 06/13/2019 9:04:09 AM This report has been signed electronically. Number of Addenda: 0 Note Initiated On: 06/13/2019 8:43 AM      [facility]

## 2019-06-13 NOTE — Telephone Encounter (Signed)
Patient called & would like to go over the findings of her upper endoscopy done today. She does not remember what she was told.

## 2019-06-13 NOTE — Progress Notes (Signed)
Outpatient Surgical Follow Up  06/13/2019  Whitney [name] is an 68 y.o. female.   Chief Complaint  Patient presents with  . Routine Post Op    lap chole 04/26/2019    HPI: Whitney [name] is a 68 year old female well-known to me with a previous cholecystectomy close to 2 months ago now comes in with a new bulge.  She notices some intermittent epigastric pain that is worsening with Valsalva.  Pain is sharp and mild to moderate intensity.  No fevers no chills.  We did perform a CT scan that I have personally reviewed showing evidence of good sized ventral hernia as well as an umbilical hernia they are very close in distance within each other.  Past Medical History:  Diagnosis Date  . Abnormal Pap smear of cervix    Pt states she had colposcopy   . Arthritis   . Chronic kidney disease    removed left kidney  . Dyspnea   . Dysrhythmia   . Hx of unilateral nephrectomy 1979   Left Nephrectomy  . Hypertension   . Lower extremity edema   . Palpitations   . PMB (postmenopausal bleeding)   . Restless leg syndrome   . Sleep apnea    has no cpap  . Vitamin D deficiency     Past Surgical History:  Procedure Laterality Date  . CHOLECYSTECTOMY    . HYSTEROSCOPY W/D&C N/A 02/16/2017   Procedure: DILATATION AND CURETTAGE /HYSTEROSCOPY;  Surgeon: Defrancesco, Alanda Slim, MD;  Location: [facility] ORS;  Service: Gynecology;  Laterality: N/A;  . JOINT REPLACEMENT    . kidney removal Left   . KIDNEY SURGERY Left 1979   left kidney removed  . LEFT HEART CATH AND CORONARY ANGIOGRAPHY N/A 10/21/2017   Procedure: LEFT HEART CATH AND CORONARY ANGIOGRAPHY;  Surgeon: Teodoro Spray, MD;  Location: [facility];  Service: Cardiovascular;  Laterality: N/A;  . TOTAL KNEE ARTHROPLASTY Left 07/06/2017   Procedure: TOTAL KNEE ARTHROPLASTY;  Surgeon: Lovell Sheehan, MD;  Location: [facility] ORS;  Service: Orthopedics;  Laterality: Left;  . WRIST ARTHROSCOPY Left 1970s    Family History  Problem Relation Age of Onset   . Ovarian cancer Mother   . Hypertension Father   . Diabetes Father   . Cancer Father   . Breast cancer Sister   . Diabetes Sister     Social History:  reports that she has never smoked. She has never used smokeless tobacco. She reports current alcohol use of about 2.0 standard drinks of alcohol per week. She reports current drug use. Frequency: 3.00 times per week. Drug: Marijuana.  Allergies:  Allergies  Allergen Reactions  . Enalapril Swelling  . Lisinopril Swelling    Medications reviewed.    ROS Full ROS performed and is otherwise negative other than what is stated in HPI   BP (!) 161/102   Pulse 70   Temp 97.7 F (36.5 C) (Temporal)   Resp 12   Ht 5\' 5"  (1.651 m)   Wt 260 lb 3.2 oz (118 kg)   LMP 01/26/2017 Comment: age 5  SpO2 97%   BMI 43.30 kg/m   Physical Exam Vitals signs and nursing note reviewed. Exam conducted with a chaperone present.  Constitutional:      Appearance: Normal appearance. She is obese. She is not ill-appearing.  Cardiovascular:     Rate and Rhythm: Normal rate.     Heart sounds: No murmur.  Pulmonary:     Effort: Pulmonary effort is  normal. No respiratory distress.     Breath sounds: Normal breath sounds. No stridor. No wheezing.  Abdominal:     General: Abdomen is flat.     Palpations: Abdomen is soft.     Tenderness: There is no abdominal tenderness. There is no guarding or rebound.     Hernia: A hernia is present.     Comments: Evidence of supraumbilical ventral hernia.  Mildly tender to palpation.  No peritonitis.  Musculoskeletal: Normal range of motion.        General: No swelling.  Skin:    General: Skin is warm and dry.     Capillary Refill: Capillary refill takes less than 2 seconds.  Neurological:     General: No focal deficit present.     Mental Status: She is alert and oriented to person, place, and time.  Psychiatric:        Mood and Affect: Mood normal.        Behavior: Behavior normal.        Thought  Content: Thought content normal.        Judgment: Judgment normal.     Assessment/Plan: 68 year old female with symptomatic ventral hernia in need for repair.  She wishes to wait at least until the holidays to have surgery performed.  We will see her back in 1 to 2 months and will talk again about surgical intervention.  I do think that she will benefit from robotic ventral hernia repair.  In the interim I did encourage her to lose weight.  No need for emergent surgical intervention at this time   Greater than 50% of the 25 minutes  visit was spent in counseling/coordination of care   Caroleen Hamman, MD [facility] Surgeon

## 2019-06-13 NOTE — H&P (Signed)
[name], MD [address] [place], [place], [place], [address] [address], [place], [place], [place], [address] Phone: [phone]  Fax: [phone]  Primary Care Physician:  Ronnell Freshwater, NP   Pre-Procedure History & Physical: HPI:  Whitney [name] is a 68 y.o. female is here for an endoscopy and colonoscopy    Past Medical History:  Diagnosis Date  . Abnormal Pap smear of cervix    Pt states she had colposcopy   . Arthritis   . Chronic kidney disease    removed left kidney  . Dyspnea   . Dysrhythmia   . Hx of unilateral nephrectomy 1979   Left Nephrectomy  . Hypertension   . Lower extremity edema   . Palpitations   . PMB (postmenopausal bleeding)   . Restless leg syndrome   . Sleep apnea    has no cpap  . Vitamin D deficiency     Past Surgical History:  Procedure Laterality Date  . CHOLECYSTECTOMY    . HYSTEROSCOPY W/D&C N/A 02/16/2017   Procedure: DILATATION AND CURETTAGE /HYSTEROSCOPY;  Surgeon: Defrancesco, Alanda Slim, MD;  Location: [facility] OR[facility];  Service: Gynecology;  Laterality: N/A;  . JOINT REPLACEMENT    . kidney removal Left   . KIDNEY SURGERY Left 1979   left kidney removed  . LEFT HEART CATH AND CORONARY ANGIOGRAPHY N/A 10/21/2017   Procedure: LEFT HEART CATH AND CORONARY ANGIOGRAPHY;  Surgeon: Teodoro Spray, MD;  Location: [facility];  Service: Cardiovascular;  Laterality: N/A;  . TOTAL KNEE ARTHROPLASTY Left 07/06/2017   Procedure: TOTAL KNEE ARTHROPLASTY;  Surgeon: Lovell Sheehan, MD;  Location: [facility];  Service: Orthopedics;  Laterality: Left;  . WRIST ARTHROSCOPY Left 1970s    Prior to Admission medications   Medication Sig Start Date End Date Taking? Authorizing Provider  acetaminophen (TYLENOL) 500 MG tablet Take 1,000 mg by mouth every 6 (six) hours as needed for moderate pain or headache.   Yes [provider]  aspirin EC 81 MG tablet Take 81 mg by mouth daily.   Yes [provider]  atorvastatin  (LIPITOR) 40 MG tablet Take 1 tablet (40 mg total) by mouth daily at 6 PM. 09/10/18  Yes Boscia, Greer Ee, NP  carvedilol (COREG) 3.125 MG tablet Take 2 tablets (6.25 mg total) by mouth 2 (two) times daily with a meal. 06/01/19  Yes Scarboro, Audie Clear, NP  Cholecalciferol (VITAMIN D) 50 MCG (2000 UT) tablet Take 2,000 Units by mouth daily.   Yes [provider]  famotidine (PEPCID) 40 MG tablet Take 1 tablet (40 mg total) by mouth daily. Patient taking differently: Take 40 mg by mouth daily as needed.  11/02/18  Yes [name], MD  flecainide (TAMBOCOR) 100 MG tablet Take 100 mg by mouth 2 (two) times daily.   Yes [provider]  gabapentin (NEURONTIN) 100 MG capsule Take 1 capsule (100 mg total) by mouth 2 (two) times daily. Patient taking differently: Take 100 mg by mouth 2 (two) times daily as needed (pain).  10/22/17  Yes Wieting, [place], MD  hydrALAZINE (APRESOLINE) 10 MG tablet Take 1 tablet (10 mg total) by mouth 3 (three) times daily. 10/25/18  Yes Boscia, Greer Ee, NP  nystatin ointment (MYCOSTATIN) Apply 1 application topically 2 (two) times daily as needed (knee pain).   Yes [provider]  rOPINIRole (REQUIP) 0.5 MG tablet Takes 1 or 2 tablets daily as needed Patient taking differently: Take 0.5 mg  by mouth 2 (two) times daily as needed (restless legs).  01/11/18  Yes Boscia, Greer Ee, NP  traMADol (ULTRAM) 50 MG tablet Take 1 tablet (50 mg total) by mouth every 6 (six) hours as needed. Patient taking differently: Take 50 mg by mouth every 6 (six) hours as needed for moderate pain.  09/10/18  Yes Ronnell Freshwater, NP  traZODone (DESYREL) 50 MG tablet Take 1 tablet (50 mg total) by mouth at bedtime as needed for sleep. 09/10/18  Yes Ronnell Freshwater, NP    Allergies as of 05/31/2019 - Review Complete 05/27/2019  Allergen Reaction Noted  . Enalapril Swelling   . Lisinopril Swelling 05/26/2016    Family History  Problem Relation Age of Onset  . Ovarian cancer  Mother   . Hypertension Father   . Diabetes Father   . Cancer Father   . Breast cancer Sister   . Diabetes Sister     Social History   Socioeconomic History  . Marital status: Divorced    Spouse name: Not on file  . Number of children: Not on file  . Years of education: Not on file  . Highest education level: Not on file  Occupational History  . Not on file  Social Needs  . Financial resource strain: Not on file  . Food insecurity    Worry: Not on file    Inability: Not on file  . Transportation needs    Medical: Not on file    Non-medical: Not on file  Tobacco Use  . Smoking status: Never Smoker  . Smokeless tobacco: Never Used  Substance and Sexual Activity  . Alcohol use: Yes    Alcohol/week: 2.0 standard drinks    Types: 2 Standard drinks or equivalent per week    Comment: occasionally  . Drug use: Yes    Frequency: 3.0 times per week    Types: Marijuana    Comment: Pt states she uses for leg pain: marijuana; states no longer uses cocaine  . Sexual activity: Yes    Birth control/protection: None  Lifestyle  . Physical activity    Days per week: Not on file    Minutes per session: Not on file  . Stress: Not on file  Relationships  . Social Herbalist on phone: Not on file    Gets together: Not on file    Attends religious service: Not on file    Active member of club or organization: Not on file    Attends meetings of clubs or organizations: Not on file    Relationship status: Not on file  . Intimate partner violence    Fear of current or ex partner: Not on file    Emotionally abused: Not on file    Physically abused: Not on file    Forced sexual activity: Not on file  Other Topics Concern  . Not on file  Social History Narrative  . Not on file    Review of Systems: See HPI, otherwise negative ROS  Physical Exam: BP (!) 190/82   Pulse 75   Temp (!) 97.2 F (36.2 C)   Resp 20   Ht 5\' 5"  (1.651 m)   Wt 117 kg   LMP 01/26/2017  Comment: age 71  SpO2 98%   BMI 42.93 kg/m  General:   Alert,  pleasant and cooperative in NAD Head:  Normocephalic and atraumatic. Neck:  Supple; no masses or thyromegaly. Lungs:  Clear throughout to auscultation, normal respiratory  effort.    Heart:  +S1, +S2, Regular rate and rhythm, No edema. Abdomen:  Soft, nontender and nondistended. Normal bowel sounds, without guarding, and without rebound.   Neurologic:  Alert and  oriented x4;  grossly normal neurologically.  Impression/Plan: Whitney [name] is here for an endoscopy and colonoscopy  to be performed for  evaluation of abdominal pain     Risks, benefits, limitations, and alternatives regarding endoscopy have been reviewed with the patient.  Questions have been answered.  All parties agreeable.   [name], MD  06/13/2019, 8:41 AM

## 2019-06-13 NOTE — OR Nursing (Addendum)
On Friday Nov. 6th, pt was informed that she could have [facility] drop her off at [facility] prior to the procedure but that [facility] would not drive her home unless she had someone with her to  Ride with her.  Pt was informed that [facility] called back and would not pick pt up unless she had another person to ride with her. Prior to procedure Norlene Campbell RN had spoken to pt's cousin Baker Janus that she would be pt's ride home and needed a 30 minute notice.    This RN called both pt's cousin Baker Janus and sister Lorriane Shire to request them come to pick pt up from [facility]. Neither person answered their phone. Message left on voicemail.  Pt was upset, recalled [facility] herself and left the unit on foot after several staff members tried to get her to stay find a ride home.  Pt reported she got a ride home from Day surgery after her gallbladder surgery and had her cousin meet her at the home, and [facility] was going to pick her up now same as gallbladder surgery.

## 2019-06-13 NOTE — Anesthesia Postprocedure Evaluation (Signed)
Anesthesia Post Note  Patient: Whitney [name]  Procedure(s) Performed: COLONOSCOPY WITH PROPOFOL (N/A ) ESOPHAGOGASTRODUODENOSCOPY (EGD) WITH PROPOFOL (N/A )  Patient location during evaluation: Endoscopy Anesthesia Type: General Level of consciousness: awake and alert Pain management: pain level controlled Vital Signs Assessment: post-procedure vital signs reviewed and stable Respiratory status: spontaneous breathing and respiratory function stable Cardiovascular status: stable Anesthetic complications: no     Last Vitals:  Vitals:   06/13/19 0914 06/13/19 0924  BP: 126/83 (!) 143/60  Pulse: 79 77  Resp: 20 19  Temp: 36.4 C   SpO2: 96% 97%    Last Pain:  Vitals:   06/13/19 0924  TempSrc:   PainSc: 0-No pain                 Roselynn Whitacre K

## 2019-06-13 NOTE — Op Note (Signed)
[facility] Gastroenterology Patient Name: Whitney [name] Procedure Date: 06/13/2019 8:43 AM MRN: [id number] Account #: [id number] Date of Birth: 11/02/1950 Admit Type: Outpatient Age: 68 Room: [facility] ENDO ROOM 4 Gender: Female Note Status: Finalized Procedure:             Colonoscopy Indications:           Abdominal pain Providers:             [name] MD, MD Medicines:             Monitored Anesthesia Care Complications:         No immediate complications. Procedure:             Pre-Anesthesia Assessment:                        - Prior to the procedure, a History and Physical was                         performed, and patient medications, allergies and                         sensitivities were reviewed. The patient's tolerance                         of previous anesthesia was reviewed.                        - The risks and benefits of the procedure and the                         sedation options and risks were discussed with the                         patient. All questions were answered and informed                         consent was obtained.                        - ASA Grade Assessment: III - A patient with severe                         systemic disease.                        After obtaining informed consent, the colonoscope was                         passed under direct vision. Throughout the procedure,                         the patient's blood pressure, pulse, and oxygen                         saturations were monitored continuously. The                         Colonoscope was introduced through the anus with the  intention of advancing to the cecum. The scope was                         advanced to the sigmoid colon before the procedure was                         aborted. Medications were given. The colonoscopy was                         performed with ease. The patient tolerated the                         procedure  well. The quality of the bowel preparation                         was poor. Findings:      The perianal and digital rectal examinations were normal.      A large amount of stool was found in the rectum and in the sigmoid colon. Impression:            - Preparation of the colon was poor.                        - Stool in the rectum and in the sigmoid colon.                        - No specimens collected. Recommendation:        - Discharge patient to home (with escort).                        - Resume previous diet.                        - Continue present medications.                        - Repeat colonoscopy in 4 weeks because the bowel                         preparation was poor. Procedure Code(s):     --- Professional ---                        [phone], 89, Colonoscopy, flexible; diagnostic,                         including collection of specimen(s) by brushing or                         washing, when performed (separate procedure) Diagnosis Code(s):     --- Professional ---                        R10.9, Unspecified abdominal pain CPT copyright 2019 American Medical Association. All rights reserved. The codes documented in this report are preliminary and upon coder review may  be revised to meet current compliance requirements. [name], MD [name] MD, MD 06/13/2019 9:11:47 AM This report has been signed electronically. Number of Addenda: 0 Note Initiated On: 06/13/2019 8:43 AM Total Procedure Duration: 0 hours 1 minute  47 seconds  Estimated Blood Loss:  Estimated blood loss: none.      [facility]

## 2019-06-13 NOTE — Anesthesia Preprocedure Evaluation (Addendum)
Anesthesia Evaluation  Patient identified by MRN, date of birth, ID band Patient awake    History of Anesthesia Complications (+) history of anesthetic complications ("blisters" on face and congestion s/p cholecytectomy)  Airway Mallampati: III       Dental  (+) Partial Upper   Pulmonary sleep apnea (not using CPAP) , neg COPD, Not current smoker,           Cardiovascular hypertension, Pt. on medications (-) Past MI and (-) CHF + dysrhythmias Atrial Fibrillation (-) Valvular Problems/Murmurs     Neuro/Psych neg Seizures    GI/Hepatic Neg liver ROS, neg GERD  ,  Endo/Other  neg diabetesMorbid obesity  Renal/GU Renal InsufficiencyRenal disease     Musculoskeletal   Abdominal   Peds  Hematology   Anesthesia Other Findings   Reproductive/Obstetrics                            Anesthesia Physical Anesthesia Plan  ASA: III  Anesthesia Plan: General   Post-op Pain Management:    Induction: Intravenous  PONV Risk Score and Plan: Propofol infusion, TIVA and Treatment may vary due to age or medical condition  Airway Management Planned: Nasal Cannula  Additional Equipment:   Intra-op Plan:   Post-operative Plan:   Informed Consent: I have reviewed the patients History and Physical, chart, labs and discussed the procedure including the risks, benefits and alternatives for the proposed anesthesia with the patient or authorized representative who has indicated his/her understanding and acceptance.       Plan Discussed with:   Anesthesia Plan Comments:         Anesthesia Quick Evaluation

## 2019-06-13 NOTE — Addendum Note (Signed)
Addended by: Lavera Guise on: 06/13/2019 06:37 PM   Modules accepted: Level of Service

## 2019-06-13 NOTE — Anesthesia Post-op Follow-up Note (Signed)
Anesthesia QCDR form completed.        

## 2019-06-13 NOTE — Transfer of Care (Signed)
Immediate Anesthesia Transfer of Care Note  Patient: Whitney [name]  Procedure(s) Performed: COLONOSCOPY WITH PROPOFOL (N/A ) ESOPHAGOGASTRODUODENOSCOPY (EGD) WITH PROPOFOL (N/A )  Patient Location: PACU  Anesthesia Type:General  Level of Consciousness: awake  Airway & Oxygen Therapy: Patient Spontanous Breathing and Patient connected to nasal cannula oxygen  Post-op Assessment: Report given to RN and Post -op Vital signs reviewed and stable  Post vital signs: Reviewed and stable  Last Vitals:  Vitals Value Taken Time  BP    Temp    Pulse    Resp    SpO2      Last Pain:  Vitals:   06/13/19 0745  PainSc: 0-No pain         Complications: No apparent anesthesia complications

## 2019-06-14 ENCOUNTER — Encounter: Payer: Self-pay | Admitting: Gastroenterology

## 2019-06-14 LAB — SURGICAL PATHOLOGY

## 2019-06-15 NOTE — Addendum Note (Signed)
Addended by: Kendell Bane on: 06/15/2019 12:32 PM   Modules accepted: Orders

## 2019-06-16 ENCOUNTER — Other Ambulatory Visit: Payer: Self-pay

## 2019-06-16 DIAGNOSIS — K2289 Other specified disease of esophagus: Secondary | ICD-10-CM

## 2019-06-16 DIAGNOSIS — K228 Other specified diseases of esophagus: Secondary | ICD-10-CM

## 2019-06-16 DIAGNOSIS — R1013 Epigastric pain: Secondary | ICD-10-CM

## 2019-06-16 MED ORDER — PEG 3350-KCL-NA BICARB-NACL 420 G PO SOLR: 4000.0000 mL | Freq: Once | ORAL | 0 refills | Status: AC

## 2019-06-16 NOTE — Telephone Encounter (Signed)
Spoke with pt and informed her of Dr. Georgeann Oppenheim recommendations. Pt agrees. Pt's repeat colonoscopy has been rescheduled to 07-15-19. I reminded pt that she would have to have someone accompany her while at the facility and they would need to accompany her on her ride home.

## 2019-06-16 NOTE — Telephone Encounter (Signed)
-----   Message from [name], MD sent at 06/13/2019  9:19 AM EST ----- Whitney [name]  Please reschedule colonoscopy: Poor prep today: 2-day prep.Please refer to Dr. Mansouraty/Dr. Ardis Hughs for EUS and EMR of nodules at the GE junction seen on endoscopy

## 2019-06-17 ENCOUNTER — Telehealth: Payer: Self-pay

## 2019-06-17 NOTE — Telephone Encounter (Signed)
Mansouraty, Telford Nab., MD  Timothy Lasso, RN; Milus Banister, MD        EGD reviewed.  EGD performed for abdominal pain, but the lesions noted on the esophagus/hiatal hernia region most likely are not associated with her symptoms.  The lesions were not sampled/biopsied, I suspect to decrease risk of fibrosis if EMR were necessary.  A non-urgent EUS is not unreasonable, though these lesions do not look to be significantly submucosal based on the current endoscopy report.  If nothing seen on EUS other than thickening in the wall (if that), then sampling vs resection could be considered.   Please schedule a non-urgent EGD/EUS with possible resection with DJ or myself (Radial EUS) - 60 minutes should suffice.   Thanks.  Tomma Rakers

## 2019-06-20 ENCOUNTER — Other Ambulatory Visit: Payer: Self-pay

## 2019-06-20 DIAGNOSIS — K3189 Other diseases of stomach and duodenum: Secondary | ICD-10-CM

## 2019-06-20 NOTE — Telephone Encounter (Signed)
Called the pt to set up EGD EUS for submucosal lesion.  Can offer 07/28/19 at 830 am with Dr Ardis Hughs

## 2019-06-20 NOTE — Telephone Encounter (Signed)
appt made and message left for pt to return call.  Information mailed.

## 2019-06-26 ENCOUNTER — Encounter: Payer: Self-pay | Admitting: Gastroenterology

## 2019-06-27 ENCOUNTER — Encounter: Payer: Self-pay | Admitting: Gastroenterology

## 2019-06-27 ENCOUNTER — Other Ambulatory Visit: Payer: Self-pay

## 2019-06-27 ENCOUNTER — Telehealth: Payer: Self-pay | Admitting: Gastroenterology

## 2019-06-27 ENCOUNTER — Ambulatory Visit (INDEPENDENT_AMBULATORY_CARE_PROVIDER_SITE_OTHER): Payer: Medicare Other | Admitting: Gastroenterology

## 2019-06-27 VITALS — BP 168/100 | HR 78 | Temp 98.2°F | Ht 65.0 in | Wt 257.8 lb

## 2019-06-27 DIAGNOSIS — B9681 Helicobacter pylori [H. pylori] as the cause of diseases classified elsewhere: Secondary | ICD-10-CM

## 2019-06-27 DIAGNOSIS — K2970 Gastritis, unspecified, without bleeding: Secondary | ICD-10-CM

## 2019-06-27 DIAGNOSIS — K3189 Other diseases of stomach and duodenum: Secondary | ICD-10-CM | POA: Diagnosis not present

## 2019-06-27 DIAGNOSIS — R1013 Epigastric pain: Secondary | ICD-10-CM | POA: Diagnosis not present

## 2019-06-27 MED ORDER — AMOXICILLIN 500 MG PO TABS: 1000.0000 mg | ORAL_TABLET | Freq: Three times a day (TID) | ORAL | 0 refills | Status: AC

## 2019-06-27 MED ORDER — CLARITHROMYCIN 500 MG PO TABS: 500.0000 mg | ORAL_TABLET | Freq: Two times a day (BID) | ORAL | 0 refills | Status: AC

## 2019-06-27 MED ORDER — OMEPRAZOLE 20 MG PO CPDR: 20.0000 mg | DELAYED_RELEASE_CAPSULE | Freq: Two times a day (BID) | ORAL | 0 refills | Status: DC

## 2019-06-27 NOTE — Telephone Encounter (Signed)
Dr Ardis Hughs the pt has called to cancel the procedure on 12/24-she states transportation is an issue and she is not sure when she will have a ride .  She states she will call back in a few weeks when she decides to have the EUS done and gets transportation figured out.  I have called and cancelled.

## 2019-06-27 NOTE — Addendum Note (Signed)
Addended by: Dorethea Clan on: 06/27/2019 01:36 PM   Modules accepted: Orders

## 2019-06-27 NOTE — Progress Notes (Signed)
[name] MD, MRCP(U.K) [address]  [place]  [place], [place] [address]  Main: [phone]  Fax: [phone]   Primary Care Physician: Ronnell Freshwater, NP  Primary Gastroenterologist:  Dr. [name]   Follow-up for dyspepsia  HPI: Whitney [name] is a 68 y.o. female   Summary of history :  Initially seen and referred on 3/31/2020for abdominal pain.Abdominal CT scan in March 2019also for abdominal distention nausea dizziness bradycardia. There was a small low-attenuation area within the body of the pancreas. Lobular right kidney with questionable low-attenuation area in the lower pole. Small uterine fibroids. Moderate abdominal aortic atherosclerosis and diffuse coronary artery calcifications. Multiple colonic diverticula at the recto sigmoid and descending colon. MRI of the abdomen in May 2019 which demonstrated benign liver abnormalities including multiple hemangiomas and cysts. Gallstone.'s multiple small cystic foci throughout the pancreas. Findings may represent small pseudocysts or areas of sidebranch duct ectasia. Cystic neoplasm of the pancreas not excluded follow-up imaging in 24 months recommended. 10/19/2018 HIDA scan: Normal.   Abdominal pain:Onset:2.5 years associated with passing out on 1 occasion . Pain no better after a meal Site :above belly button Radiation:localized , each episode lasts 5-10 minutes , gets around 3 episodes a week . Severity :severe Nature of pain:dull pain Aggravating factors:unclear- not related to meals. Everytime she gets the pain starts sweating Relieving factors :when she lays down , Weight loss:has lost some weight with dieting does not think the pain has made her lose weight. NSAID use:no - she used to take Motrin - stopped PPI use :no Gall bladder surgery:intact Frequency of bowel movements:almost everyday - very regular Change in bowel movements:no Relief with bowel movements:unsure  Gas/Bloating/Abdominal distension:no  02/18/2019: H pylori breath test - negative 03/04/2019: MRCP- progression of the gall bladder polyp which is 10 mm in size . Pancreas lesion is stable repeat in 1 year  Discussed with Dr Rush Landmark who agrees with the plan for MRI in 1 year   Interval history8/11/2018-06/27/2019  06/13/2019 colonoscopy: Large quantity of stool seen in the rectum and sigmoid repeat colonoscopy recommended.  EGD.  Medium size hiatal hernia seen nodule seen just past the GE junction.  No biopsies taken refer to Dr. Rush Landmark for EUS and EMR.  Biopsies of the stomach demonstrated H. pylori gastritis.  Underwent laparoscopic cholecystectomy with Dr. Dahlia Byes in November 2020 05/27/2019: CT scan of the abdomen and pelvis showed moderate supraumbilical midline ventral hernia containing inflamed flat and small amount of fluid.  Stable small pancreatic cysts.  Followed up with Dr. Dahlia Byes on 06/08/2019 and plan is for surgery in the future for the ventral hernia.   Feels much better after cholecystectomy.  No abdominal pain presently.  Has an abnormal sensation when she bends forward in the midline.   Current Outpatient Medications  Medication Sig Dispense Refill  . acetaminophen (TYLENOL) 500 MG tablet Take 1,000 mg by mouth every 6 (six) hours as needed for moderate pain or headache.    Marland Kitchen aspirin EC 81 MG tablet Take 81 mg by mouth daily.    Marland Kitchen atorvastatin (LIPITOR) 40 MG tablet Take 1 tablet (40 mg total) by mouth daily at 6 PM. 90 tablet 1  . carvedilol (COREG) 3.125 MG tablet Take 2 tablets (6.25 mg total) by mouth 2 (two) times daily with a meal. 120 tablet 3  . Cholecalciferol (VITAMIN D) 50 MCG (2000 UT) tablet Take 2,000 Units by mouth daily.    . famotidine (PEPCID) 40 MG tablet Take 1  tablet (40 mg total) by mouth daily. (Patient taking differently: Take 40 mg by mouth daily as needed. ) 90 tablet 1  . flecainide (TAMBOCOR) 100 MG tablet Take 100 mg by mouth 2 (two)  times daily.    Marland Kitchen gabapentin (NEURONTIN) 100 MG capsule Take 1 capsule (100 mg total) by mouth 2 (two) times daily. (Patient taking differently: Take 100 mg by mouth 2 (two) times daily as needed (pain). ) 90 capsule 0  . hydrALAZINE (APRESOLINE) 10 MG tablet Take 1 tablet (10 mg total) by mouth 3 (three) times daily. 270 tablet 1  . nystatin ointment (MYCOSTATIN) Apply 1 application topically 2 (two) times daily as needed (knee pain).    Marland Kitchen rOPINIRole (REQUIP) 0.5 MG tablet Takes 1 or 2 tablets daily as needed (Patient taking differently: Take 0.5 mg by mouth 2 (two) times daily as needed (restless legs). ) 90 tablet 1  . traMADol (ULTRAM) 50 MG tablet Take 1 tablet (50 mg total) by mouth every 6 (six) hours as needed. (Patient taking differently: Take 50 mg by mouth every 6 (six) hours as needed for moderate pain. ) 20 tablet 0  . traZODone (DESYREL) 50 MG tablet Take 1 tablet (50 mg total) by mouth at bedtime as needed for sleep. 30 tablet 3   Current Facility-Administered Medications  Medication Dose Route Frequency Provider Last Rate Last Dose  . indocyanine green (IC-GREEN) injection 7.5 mg  7.5 mg Intravenous Once Jules Husbands, MD        Allergies as of 06/27/2019 - Review Complete 06/13/2019  Allergen Reaction Noted  . Enalapril Swelling   . Lisinopril Swelling 05/26/2016    ROS:  General: Negative for anorexia, weight loss, fever, chills, fatigue, weakness. ENT: Negative for hoarseness, difficulty swallowing , nasal congestion. CV: Negative for chest pain, angina, palpitations, dyspnea on exertion, peripheral edema.  Respiratory: Negative for dyspnea at rest, dyspnea on exertion, cough, sputum, wheezing.  GI: See history of present illness. GU:  Negative for dysuria, hematuria, urinary incontinence, urinary frequency, nocturnal urination.  Endo: Negative for unusual weight change.    Physical Examination:   LMP 01/26/2017 Comment: age 64  General: Well-nourished,  well-developed in no acute distress.  Eyes: No icterus. Conjunctivae pink. Mouth: Oropharyngeal mucosa moist and pink , no lesions erythema or exudate. Lungs: Clear to auscultation bilaterally. Non-labored. Heart: Regular rate and rhythm, no murmurs rubs or gallops.  Abdomen: Bowel sounds are normal, nontender, nondistended, no hepatosplenomegaly or masses, no abdominal bruits or hernia , no rebound or guarding.   Extremities: No lower extremity edema. No clubbing or deformities. Neuro: Alert and oriented x 3.  Grossly intact. Skin: Warm and dry, no jaundice.   Psych: Alert and cooperative, normal mood and affect.   Imaging Studies: No results found.  Assessment and Plan:   DANEAN MARVIN is a 68 y.o. y/o female here to follow upfor abdominal pain. Appears to be longstanding. Previous abdominal imaging showed abnormalities of the pancreas and per radiology report recommended repeating scan in 2 years. HIDA normal.  Status post cholecystectomy for gallbladder polyp at 10 mm size .Marland Kitchen The pancreas lesion is stable .  Possible the abdominal symptoms in the past were due to the gallbladder polyp.  She also has a midline ventral hernia that is being planned for repair.  H. pylori gastritis seen on EGD.  Plan :  1. Repeat colonoscopy due to poor prep scheduled on 07/15/2019 2. MRI pancreas in 1 year 3. Dr Dahlia Byes has a  plan for repair of ventral hernia in the near future 4.  H. pylori gastritis: Treat with antibiotics and repeat H. pylori breath test 6 weeks after treatment of PPI to check for eradication. 5.  Procedure evaluation of submucosal lesion with Dr. Rush Landmark on 07/28/2019 which has been scheduled.  Dr [name]  MD,MRCP [facility]) Follow up in 3 months

## 2019-06-28 ENCOUNTER — Other Ambulatory Visit: Payer: Self-pay

## 2019-06-28 ENCOUNTER — Ambulatory Visit (INDEPENDENT_AMBULATORY_CARE_PROVIDER_SITE_OTHER): Payer: Medicare Other | Admitting: Adult Health

## 2019-06-28 ENCOUNTER — Telehealth: Payer: Self-pay

## 2019-06-28 VITALS — BP 150/58 | Resp 16 | Ht 65.0 in | Wt 256.0 lb

## 2019-06-28 DIAGNOSIS — R42 Dizziness and giddiness: Secondary | ICD-10-CM | POA: Diagnosis not present

## 2019-06-28 DIAGNOSIS — I10 Essential (primary) hypertension: Secondary | ICD-10-CM | POA: Diagnosis not present

## 2019-06-28 MED ORDER — MECLIZINE HCL 25 MG PO TABS: 25.0000 mg | ORAL_TABLET | Freq: Two times a day (BID) | ORAL | 0 refills | Status: DC | PRN

## 2019-06-28 MED ORDER — HYDRALAZINE HCL 25 MG PO TABS: 25.0000 mg | ORAL_TABLET | Freq: Three times a day (TID) | ORAL | 3 refills | Status: DC

## 2019-06-28 NOTE — Telephone Encounter (Signed)
PER ADAM CALLED PT TO ADVISE HER THAT SHE HAS TO TAKE HYDRALAZINE 2 TABS (20 MG) THREE TIMES DAILY UNTIL HER NEXT APPT. HYDRALAZINE 25 MG TID WAS SENT TO THE PHARMACY BUT PT WAS ADVISED NOT TO PICK THIS RX UP YET.   PT WAS ALSO ADVISED TO GET BLOOD WORK DONE WITHIN THE NEXT WEEK AND FOLLOW UP ON 07/12/2019, APPT WAS ALREADY SCHED.

## 2019-06-28 NOTE — Progress Notes (Signed)
[facility] [place], [place] [address]  Internal MEDICINE  Telephone Visit  Patient Name: Whitney [name]  [id number]  [id number]  Date of Service: 06/28/2019  I connected with the patient at 847 by telephone and verified the patients identity using two identifiers.   I discussed the limitations, risks, security and privacy concerns of performing an evaluation and management service by telephone and the availability of in person appointments. I also discussed with the patient that there may be a patient responsible charge related to the service.  The patient expressed understanding and agrees to proceed.    Chief Complaint  Patient presents with  . Telephone Assessment  . Telephone Screen  . Headache  . Dizziness    HPI  Pt seen via telephone. She reports over a week ago, she had an episode of dizziness/light headedness.  These episodes have happened intermittently since.  She reports she was at her pre-op appt for colonoscopy and had another episode.  The doctor there, told her it seemed like vertigo and she should follow up with her pcp.  She reports each episode lasts about 1 minute.  She denies any nausea.  She has been drinking a lot of water because she read that it could be brought on by dehydration.     Current Medication: Outpatient Encounter Medications as of 06/28/2019  Medication Sig  . acetaminophen (TYLENOL) 500 MG tablet Take 1,000 mg by mouth every 6 (six) hours as needed for moderate pain or headache.  Marland Kitchen amoxicillin (AMOXIL) 500 MG tablet Take 2 tablets (1,000 mg total) by mouth 3 (three) times daily for 14 days.  Marland Kitchen aspirin EC 81 MG tablet Take 81 mg by mouth daily.  Marland Kitchen atorvastatin (LIPITOR) 40 MG tablet Take 1 tablet (40 mg total) by mouth daily at 6 PM.  . carvedilol (COREG) 3.125 MG tablet Take 2 tablets (6.25 mg total) by mouth 2 (two) times daily with a meal.  . Cholecalciferol (VITAMIN D) 50 MCG (2000 UT) tablet Take 2,000 Units by  mouth daily.  . clarithromycin (BIAXIN) 500 MG tablet Take 1 tablet (500 mg total) by mouth 2 (two) times daily for 14 days.  . famotidine (PEPCID) 40 MG tablet Take 1 tablet (40 mg total) by mouth daily. (Patient taking differently: Take 40 mg by mouth daily as needed. )  . flecainide (TAMBOCOR) 100 MG tablet Take 100 mg by mouth 2 (two) times daily.  Marland Kitchen gabapentin (NEURONTIN) 100 MG capsule Take 1 capsule (100 mg total) by mouth 2 (two) times daily. (Patient taking differently: Take 100 mg by mouth 2 (two) times daily as needed (pain). )  . hydrALAZINE (APRESOLINE) 25 MG tablet Take 1 tablet (25 mg total) by mouth 3 (three) times daily.  Marland Kitchen nystatin ointment (MYCOSTATIN) Apply 1 application topically 2 (two) times daily as needed (knee pain).  Marland Kitchen omeprazole (PRILOSEC) 20 MG capsule Take 1 capsule (20 mg total) by mouth 2 (two) times daily for 14 days.  Marland Kitchen rOPINIRole (REQUIP) 0.5 MG tablet Takes 1 or 2 tablets daily as needed (Patient taking differently: Take 0.5 mg by mouth 2 (two) times daily as needed (restless legs). )  . traMADol (ULTRAM) 50 MG tablet Take 1 tablet (50 mg total) by mouth every 6 (six) hours as needed. (Patient taking differently: Take 50 mg by mouth every 6 (six) hours as needed for moderate pain. )  . traZODone (DESYREL) 50 MG tablet Take 1 tablet (50 mg total) by mouth at bedtime  as needed for sleep.  . [DISCONTINUED] hydrALAZINE (APRESOLINE) 10 MG tablet Take 1 tablet (10 mg total) by mouth 3 (three) times daily.   Facility-Administered Encounter Medications as of 06/28/2019  Medication  . indocyanine green (IC-GREEN) injection 7.5 mg    Surgical History: Past Surgical History:  Procedure Laterality Date  . CHOLECYSTECTOMY    . COLONOSCOPY WITH PROPOFOL N/A 06/13/2019   Procedure: COLONOSCOPY WITH PROPOFOL;  Surgeon: [name], MD;  Location: [facility] ENDOSCOPY;  Service: Gastroenterology;  Laterality: N/A;  . ESOPHAGOGASTRODUODENOSCOPY (EGD) WITH PROPOFOL N/A 06/13/2019    Procedure: ESOPHAGOGASTRODUODENOSCOPY (EGD) WITH PROPOFOL;  Surgeon: [name], MD;  Location: [facility] ENDOSCOPY;  Service: Gastroenterology;  Laterality: N/A;  Pt will go for COVID test on [id number] as she uses public transportation and will be in the area on that day.   Marland Kitchen HYSTEROSCOPY W/D&C N/A 02/16/2017   Procedure: DILATATION AND CURETTAGE /HYSTEROSCOPY;  Surgeon: Defrancesco, Alanda Slim, MD;  Location: [facility] ORS;  Service: Gynecology;  Laterality: N/A;  . JOINT REPLACEMENT    . kidney removal Left   . KIDNEY SURGERY Left 1979   left kidney removed  . LEFT HEART CATH AND CORONARY ANGIOGRAPHY N/A 10/21/2017   Procedure: LEFT HEART CATH AND CORONARY ANGIOGRAPHY;  Surgeon: Teodoro Spray, MD;  Location: [facility] CV LAB;  Service: Cardiovascular;  Laterality: N/A;  . TOTAL KNEE ARTHROPLASTY Left 07/06/2017   Procedure: TOTAL KNEE ARTHROPLASTY;  Surgeon: Lovell Sheehan, MD;  Location: [facility] ORS;  Service: Orthopedics;  Laterality: Left;  . WRIST ARTHROSCOPY Left 1970s    Medical History: Past Medical History:  Diagnosis Date  . Abnormal Pap smear of cervix    Pt states she had colposcopy   . Arthritis   . Chronic kidney disease    removed left kidney  . Dyspnea   . Dysrhythmia   . Hx of unilateral nephrectomy 1979   Left Nephrectomy  . Hypertension   . Lower extremity edema   . Palpitations   . PMB (postmenopausal bleeding)   . Restless leg syndrome   . Sleep apnea    has no cpap  . Vitamin D deficiency     Family History: Family History  Problem Relation Age of Onset  . Ovarian cancer Mother   . Hypertension Father   . Diabetes Father   . Cancer Father   . Breast cancer Sister   . Diabetes Sister     Social History   Socioeconomic History  . Marital status: Divorced    Spouse name: Not on file  . Number of children: Not on file  . Years of education: Not on file  . Highest education level: Not on file  Occupational History  . Not on file  Social Needs  .  Financial resource strain: Not on file  . Food insecurity    Worry: Not on file    Inability: Not on file  . Transportation needs    Medical: Not on file    Non-medical: Not on file  Tobacco Use  . Smoking status: Never Smoker  . Smokeless tobacco: Never Used  Substance and Sexual Activity  . Alcohol use: Yes    Alcohol/week: 2.0 standard drinks    Types: 2 Standard drinks or equivalent per week    Comment: occasionally  . Drug use: Yes    Frequency: 3.0 times per week    Types: Marijuana    Comment: Pt states she uses for leg pain: marijuana; states no longer uses cocaine  .  Sexual activity: Yes    Birth control/protection: None  Lifestyle  . Physical activity    Days per week: Not on file    Minutes per session: Not on file  . Stress: Not on file  Relationships  . Social Herbalist on phone: Not on file    Gets together: Not on file    Attends religious service: Not on file    Active member of club or organization: Not on file    Attends meetings of clubs or organizations: Not on file    Relationship status: Not on file  . Intimate partner violence    Fear of current or ex partner: Not on file    Emotionally abused: Not on file    Physically abused: Not on file    Forced sexual activity: Not on file  Other Topics Concern  . Not on file  Social History Narrative  . Not on file      Review of Systems  Constitutional: Negative for chills, fatigue and unexpected weight change.  HENT: Negative for congestion, rhinorrhea, sneezing and sore throat.   Eyes: Negative for photophobia, pain and redness.  Respiratory: Negative for cough, chest tightness and shortness of breath.   Cardiovascular: Negative for chest pain and palpitations.  Gastrointestinal: Negative for abdominal pain, constipation, diarrhea, nausea and vomiting.  Endocrine: Negative.   Genitourinary: Negative for dysuria and frequency.  Musculoskeletal: Negative for arthralgias, back pain, joint  swelling and neck pain.  Skin: Negative for rash.  Allergic/Immunologic: Negative.   Neurological: Positive for dizziness and light-headedness. Negative for tremors and numbness.  Hematological: Negative for adenopathy. Does not bruise/bleed easily.  Psychiatric/Behavioral: Negative for behavioral problems and sleep disturbance. The patient is not nervous/anxious.     Vital Signs: BP (!) 150/58   Resp 16   Ht 5\' 5"  (1.651 m)   Wt 256 lb (116.1 kg)   LMP 01/26/2017 Comment: age 8  BMI 42.60 kg/m    Observation/Objective:  Well sounding, NAD noted   Assessment/Plan: 1. Essential hypertension Increase current hydralazine to 20 mg TID, and then start new RX (25mg  TID)  Will get labs drawn, and follow up in office in 2 weeks.  - hydrALAZINE (APRESOLINE) 25 MG tablet; Take 1 tablet (25 mg total) by mouth 3 (three) times daily.  Dispense: 90 tablet; Refill: 3 - Basic Metabolic Panel (BMET) - CBC w/Diff/Platelet  2. Vertigo Use meclizine as discussed.   3. Morbid obesity ([place]) Obesity Counseling: Risk Assessment: An assessment of behavioral risk factors was made today and includes lack of exercise sedentary lifestyle, lack of portion control and poor dietary habits.  Risk Modification Advice: She was counseled on portion control guidelines. Restricting daily caloric intake to. . The detrimental long term effects of obesity on her health and ongoing poor compliance was also discussed with the patient.    General Counseling: dajanai breer understanding of the findings of today's phone visit and agrees with plan of treatment. I have discussed any further diagnostic evaluation that may be needed or ordered today. We also reviewed her medications today. she has been encouraged to call the office with any questions or concerns that should arise related to todays visit.    Orders Placed This Encounter  Procedures  . Basic Metabolic Panel (BMET)  . CBC w/Diff/Platelet    Meds  ordered this encounter  Medications  . hydrALAZINE (APRESOLINE) 25 MG tablet    Sig: Take 1 tablet (25 mg total) by mouth 3 (  three) times daily.    Dispense:  90 tablet    Refill:  3  Meclizine sent for patient 25mg  po BID as needed.   Time spent: [facility] Internal medicine

## 2019-07-08 ENCOUNTER — Telehealth: Payer: Self-pay

## 2019-07-08 NOTE — Telephone Encounter (Signed)
Called lmom informing patient of appointment. klh 

## 2019-07-11 DIAGNOSIS — I10 Essential (primary) hypertension: Secondary | ICD-10-CM | POA: Diagnosis not present

## 2019-07-12 ENCOUNTER — Encounter: Payer: Self-pay | Admitting: Adult Health

## 2019-07-12 ENCOUNTER — Ambulatory Visit (INDEPENDENT_AMBULATORY_CARE_PROVIDER_SITE_OTHER): Payer: Medicare Other | Admitting: Adult Health

## 2019-07-12 ENCOUNTER — Other Ambulatory Visit: Payer: Medicare Other

## 2019-07-12 ENCOUNTER — Other Ambulatory Visit: Payer: Self-pay

## 2019-07-12 VITALS — BP 148/80 | HR 103

## 2019-07-12 DIAGNOSIS — I10 Essential (primary) hypertension: Secondary | ICD-10-CM

## 2019-07-12 LAB — BASIC METABOLIC PANEL
BUN/Creatinine Ratio: 18 (ref 12–28)
BUN: 25 mg/dL (ref 8–27)
CO2: 24 mmol/L (ref 20–29)
Calcium: 8.6 mg/dL — ABNORMAL LOW (ref 8.7–10.3)
Chloride: 108 mmol/L — ABNORMAL HIGH (ref 96–106)
Creatinine, Ser: 1.4 mg/dL — ABNORMAL HIGH (ref 0.57–1.00)
GFR calc Af Amer: 44 mL/min/{1.73_m2} — ABNORMAL LOW (ref >59)
GFR calc non Af Amer: 38 mL/min/{1.73_m2} — ABNORMAL LOW (ref >59)
Glucose: 94 mg/dL (ref 65–99)
Potassium: 4.4 mmol/L (ref 3.5–5.2)
Sodium: 146 mmol/L — ABNORMAL HIGH (ref 134–144)

## 2019-07-12 LAB — CBC WITH DIFFERENTIAL/PLATELET
Basophils Absolute: 0 10*3/uL (ref 0.0–0.2)
Basos: 1 %
EOS (ABSOLUTE): 0.3 10*3/uL (ref 0.0–0.4)
Eos: 4 %
Hematocrit: 39.1 % (ref 34.0–46.6)
Hemoglobin: 13.2 g/dL (ref 11.1–15.9)
Immature Grans (Abs): 0 10*3/uL (ref 0.0–0.1)
Immature Granulocytes: 0 %
Lymphocytes Absolute: 2.6 10*3/uL (ref 0.7–3.1)
Lymphs: 45 %
MCH: 29.7 pg (ref 26.6–33.0)
MCHC: 33.8 g/dL (ref 31.5–35.7)
MCV: 88 fL (ref 79–97)
Monocytes Absolute: 0.5 10*3/uL (ref 0.1–0.9)
Monocytes: 9 %
Neutrophils Absolute: 2.4 10*3/uL (ref 1.4–7.0)
Neutrophils: 41 %
Platelets: 265 10*3/uL (ref 150–450)
RBC: 4.44 x10E6/uL (ref 3.77–5.28)
RDW: 12.5 % (ref 11.7–15.4)
WBC: 5.7 10*3/uL (ref 3.4–10.8)

## 2019-07-12 NOTE — Progress Notes (Addendum)
[facility] [place], [place] [address]  Internal MEDICINE  Telephone Visit  Patient Name: Whitney [name]  [id number]  [id number]  Date of Service: 07/17/2019  I connected with the patient at 202 by telephone and verified the patients identity using two identifiers.   I discussed the limitations, risks, security and privacy concerns of performing an evaluation and management service by telephone and the availability of in person appointments. I also discussed with the patient that there may be a patient responsible charge related to the service.  The patient expressed understanding and agrees to proceed.    Chief Complaint  Patient presents with  . Telephone Assessment  . Telephone Screen  . Follow-up    dizziness  . Hypertension    HPI  Pt seen via telephone for follow up on HTN and dizziness. She has had two episodes since last visit of dizziness.  She has taken meclizine twice, with good results.  Her BP was initially elevated 161/100.  She is using a home device via telephone, and its effectiveness is questionable.  Her repeat bp was 148/80.  Denies Chest pain, Shortness of breath, palpitations, headache, or blurred vision.      Current Medication: Outpatient Encounter Medications as of 07/12/2019  Medication Sig  . acetaminophen (TYLENOL) 500 MG tablet Take 1,000 mg by mouth every 6 (six) hours as needed for moderate pain or headache.  Marland Kitchen aspirin EC 81 MG tablet Take 81 mg by mouth daily.  Marland Kitchen atorvastatin (LIPITOR) 40 MG tablet Take 1 tablet (40 mg total) by mouth daily at 6 PM.  . carvedilol (COREG) 3.125 MG tablet Take 2 tablets (6.25 mg total) by mouth 2 (two) times daily with a meal.  . Cholecalciferol (VITAMIN D) 50 MCG (2000 UT) tablet Take 2,000 Units by mouth daily.  . famotidine (PEPCID) 40 MG tablet Take 1 tablet (40 mg total) by mouth daily. (Patient taking differently: Take 40 mg by mouth daily as needed. )  . flecainide (TAMBOCOR) 100 MG  tablet Take 100 mg by mouth 2 (two) times daily.  Marland Kitchen gabapentin (NEURONTIN) 100 MG capsule Take 1 capsule (100 mg total) by mouth 2 (two) times daily. (Patient taking differently: Take 100 mg by mouth 2 (two) times daily as needed (pain). )  . hydrALAZINE (APRESOLINE) 25 MG tablet Take 1 tablet (25 mg total) by mouth 3 (three) times daily.  . meclizine (ANTIVERT) 25 MG tablet Take 1 tablet (25 mg total) by mouth 2 (two) times daily as needed for dizziness.  . nystatin ointment (MYCOSTATIN) Apply 1 application topically 2 (two) times daily as needed (knee pain).  Marland Kitchen rOPINIRole (REQUIP) 0.5 MG tablet Takes 1 or 2 tablets daily as needed (Patient taking differently: Take 0.5 mg by mouth 2 (two) times daily as needed (restless legs). )  . traMADol (ULTRAM) 50 MG tablet Take 1 tablet (50 mg total) by mouth every 6 (six) hours as needed. (Patient taking differently: Take 50 mg by mouth every 6 (six) hours as needed for moderate pain. )  . traZODone (DESYREL) 50 MG tablet Take 1 tablet (50 mg total) by mouth at bedtime as needed for sleep.  Marland Kitchen omeprazole (PRILOSEC) 20 MG capsule Take 1 capsule (20 mg total) by mouth 2 (two) times daily for 14 days.   Facility-Administered Encounter Medications as of 07/12/2019  Medication  . indocyanine green (IC-GREEN) injection 7.5 mg    Surgical History: Past Surgical History:  Procedure Laterality Date  . CHOLECYSTECTOMY    .  COLONOSCOPY WITH PROPOFOL N/A 06/13/2019   Procedure: COLONOSCOPY WITH PROPOFOL;  Surgeon: [name], MD;  Location: [facility] ENDOSCOPY;  Service: Gastroenterology;  Laterality: N/A;  . ESOPHAGOGASTRODUODENOSCOPY (EGD) WITH PROPOFOL N/A 06/13/2019   Procedure: ESOPHAGOGASTRODUODENOSCOPY (EGD) WITH PROPOFOL;  Surgeon: [name], MD;  Location: [facility] ENDOSCOPY;  Service: Gastroenterology;  Laterality: N/A;  Pt will go for COVID test on [id number] as she uses public transportation and will be in the area on that day.   Marland Kitchen HYSTEROSCOPY W/D&C N/A 02/16/2017    Procedure: DILATATION AND CURETTAGE /HYSTEROSCOPY;  Surgeon: Defrancesco, Alanda Slim, MD;  Location: [facility] ORS;  Service: Gynecology;  Laterality: N/A;  . JOINT REPLACEMENT    . kidney removal Left   . KIDNEY SURGERY Left 1979   left kidney removed  . LEFT HEART CATH AND CORONARY ANGIOGRAPHY N/A 10/21/2017   Procedure: LEFT HEART CATH AND CORONARY ANGIOGRAPHY;  Surgeon: Teodoro Spray, MD;  Location: [facility] CV LAB;  Service: Cardiovascular;  Laterality: N/A;  . TOTAL KNEE ARTHROPLASTY Left 07/06/2017   Procedure: TOTAL KNEE ARTHROPLASTY;  Surgeon: Lovell Sheehan, MD;  Location: [facility] ORS;  Service: Orthopedics;  Laterality: Left;  . WRIST ARTHROSCOPY Left 1970s    Medical History: Past Medical History:  Diagnosis Date  . Abnormal Pap smear of cervix    Pt states she had colposcopy   . Arthritis   . Chronic kidney disease    removed left kidney  . Dyspnea   . Dysrhythmia   . Hx of unilateral nephrectomy 1979   Left Nephrectomy  . Hypertension   . Lower extremity edema   . Palpitations   . PMB (postmenopausal bleeding)   . Restless leg syndrome   . Sleep apnea    has no cpap  . Vitamin D deficiency     Family History: Family History  Problem Relation Age of Onset  . Ovarian cancer Mother   . Hypertension Father   . Diabetes Father   . Cancer Father   . Breast cancer Sister   . Diabetes Sister     Social History   Socioeconomic History  . Marital status: Divorced    Spouse name: Not on file  . Number of children: Not on file  . Years of education: Not on file  . Highest education level: Not on file  Occupational History  . Not on file  Tobacco Use  . Smoking status: Never Smoker  . Smokeless tobacco: Never Used  Substance and Sexual Activity  . Alcohol use: Yes    Alcohol/week: 2.0 standard drinks    Types: 2 Standard drinks or equivalent per week    Comment: occasionally  . Drug use: Yes    Frequency: 3.0 times per week    Types: Marijuana     Comment: Pt states she uses for leg pain: marijuana; states no longer uses cocaine  . Sexual activity: Yes    Birth control/protection: None  Other Topics Concern  . Not on file  Social History Narrative  . Not on file   Social Determinants of Health   Financial Resource Strain:   . Difficulty of Paying Living Expenses: Not on file  Food Insecurity:   . Worried About Charity fundraiser in the Last Year: Not on file  . Ran Out of Food in the Last Year: Not on file  Transportation Needs:   . Lack of Transportation (Medical): Not on file  . Lack of Transportation (Non-Medical): Not on file  Physical Activity:   .  Days of Exercise per Week: Not on file  . Minutes of Exercise per Session: Not on file  Stress:   . Feeling of Stress : Not on file  Social Connections:   . Frequency of Communication with Friends and Family: Not on file  . Frequency of Social Gatherings with Friends and Family: Not on file  . Attends Religious Services: Not on file  . Active Member of Clubs or Organizations: Not on file  . Attends Archivist Meetings: Not on file  . Marital Status: Not on file  Intimate Partner Violence:   . Fear of Current or Ex-Partner: Not on file  . Emotionally Abused: Not on file  . Physically Abused: Not on file  . Sexually Abused: Not on file      Review of Systems  Constitutional: Negative for chills, fatigue and unexpected weight change.  HENT: Negative for congestion, rhinorrhea, sneezing and sore throat.   Eyes: Negative for photophobia, pain and redness.  Respiratory: Negative for cough, chest tightness and shortness of breath.   Cardiovascular: Negative for chest pain and palpitations.  Gastrointestinal: Negative for abdominal pain, constipation, diarrhea, nausea and vomiting.  Endocrine: Negative.   Genitourinary: Negative for dysuria and frequency.  Musculoskeletal: Negative for arthralgias, back pain, joint swelling and neck pain.  Skin: Negative for  rash.  Allergic/Immunologic: Negative.   Neurological: Negative for tremors and numbness.  Hematological: Negative for adenopathy. Does not bruise/bleed easily.  Psychiatric/Behavioral: Negative for behavioral problems and sleep disturbance. The patient is not nervous/anxious.     Vital Signs: BP (!) 148/80   Pulse (!) 103   LMP 01/26/2017 Comment: age 68   Observation/Objective:  Well sounding, NAD noted.   Assessment/Plan: 1. Essential hypertension Change Hydralazine to 25mg  BID, due  To elevated creatinine.  PT had Nephrectomy in 1979.  Pt will follow up in office in 1 week.    2. Morbid obesity ([place]) Obesity Counseling: Risk Assessment: An assessment of behavioral risk factors was made today and includes lack of exercise sedentary lifestyle, lack of portion control and poor dietary habits.  Risk Modification Advice: She was counseled on portion control guidelines. Restricting daily caloric intake to 1600 . The detrimental long term effects of obesity on her health and ongoing poor compliance was also discussed with the patient.    General Counseling: shea mcbeth understanding of the findings of today's phone visit and agrees with plan of treatment. I have discussed any further diagnostic evaluation that may be needed or ordered today. We also reviewed her medications today. she has been encouraged to call the office with any questions or concerns that should arise related to todays visit.    No orders of the defined types were placed in this encounter.   No orders of the defined types were placed in this encounter.   Time spent: [place] AGNP-C Internal medicine

## 2019-07-14 ENCOUNTER — Telehealth: Payer: Self-pay

## 2019-07-14 ENCOUNTER — Telehealth: Payer: Self-pay | Admitting: Obstetrics and Gynecology

## 2019-07-14 NOTE — Telephone Encounter (Signed)
The pt called and stated that she lis experiencing vaginal itching and id wanting to know if she can get an appointment some time soon approved or a prescription. Pt is requesting a call back. Please advise.

## 2019-07-14 NOTE — Telephone Encounter (Signed)
Spoke with patient and she has been taking an antibiotic prescribed from the GI doctor. She has not finished yet. I told patient that she can try monistat OTC to see if that would work. If that does not help to give [place] a call back and we can get her in. I also told her that she could try to call the GI office to see if they would prescribe diflucan. Patient verbalized understanding. She will call back if she needs to be seen.

## 2019-07-14 NOTE — Telephone Encounter (Signed)
Patient contacted office states that she has been taking 2 antibiotics prescribed by Dr. Vicente Males, and as a result she now has a yeast infection. Can we send an rx in for Diflucan to [facility] in [place]?  Thanks Sharyn Lull

## 2019-07-15 ENCOUNTER — Other Ambulatory Visit: Payer: Self-pay

## 2019-07-15 ENCOUNTER — Telehealth: Payer: Self-pay | Admitting: Gastroenterology

## 2019-07-15 MED ORDER — FLUCONAZOLE 150 MG PO TABS: 150.0000 mg | ORAL_TABLET | Freq: Once | ORAL | 0 refills | Status: AC

## 2019-07-15 NOTE — Telephone Encounter (Signed)
Per Dr. Vicente Males, Diflucan 150mg  one time dose has been sent to patients pharmacy.  Pt has been notified.  Thanks [employer]

## 2019-07-15 NOTE — Telephone Encounter (Signed)
Yes, diflucan 150 mg single dose orally.

## 2019-07-15 NOTE — Telephone Encounter (Signed)
Where does she have the yeast infection ? Throat?

## 2019-07-15 NOTE — Telephone Encounter (Signed)
Patient called in asking for medication to help with the itching of her vaginal area since she's on two antibiotic. She has checked with her GYN & they ask her to call her since we put her on the antibiotics. Please call into [facility] in [place]. She states she called & l/m yesterday with no return call.

## 2019-07-22 ENCOUNTER — Telehealth: Payer: Self-pay

## 2019-07-22 NOTE — Telephone Encounter (Signed)
Confirmed appointment with patient. klh °

## 2019-07-25 ENCOUNTER — Other Ambulatory Visit (HOSPITAL_COMMUNITY)
Admission: RE | Admit: 2019-07-25 | Payer: Medicare Other | Source: Ambulatory Visit | Attending: Gastroenterology | Admitting: Gastroenterology

## 2019-07-25 ENCOUNTER — Other Ambulatory Visit (HOSPITAL_COMMUNITY): Payer: Medicare Other

## 2019-07-26 ENCOUNTER — Ambulatory Visit: Payer: Medicare Other | Admitting: Adult Health

## 2019-07-26 DIAGNOSIS — Z008 Encounter for other general examination: Secondary | ICD-10-CM

## 2019-07-28 ENCOUNTER — Encounter (HOSPITAL_COMMUNITY): Payer: Self-pay

## 2019-07-28 ENCOUNTER — Ambulatory Visit (HOSPITAL_COMMUNITY): Admit: 2019-07-28 | Payer: Medicare Other | Admitting: Gastroenterology

## 2019-07-28 SURGERY — UPPER ENDOSCOPIC ULTRASOUND (EUS) RADIAL
Anesthesia: Monitor Anesthesia Care

## 2019-07-29 NOTE — Progress Notes (Signed)
Pt will need both renal u/s// renal artery u/s (unilateral). Repeat labs// control bp coreg, ( titrate to HR) hydralazine, add norvasc if needed

## 2019-08-08 ENCOUNTER — Ambulatory Visit (INDEPENDENT_AMBULATORY_CARE_PROVIDER_SITE_OTHER): Payer: Medicare Other | Admitting: Surgery

## 2019-08-08 ENCOUNTER — Other Ambulatory Visit: Payer: Self-pay

## 2019-08-08 ENCOUNTER — Encounter: Payer: Self-pay | Admitting: Surgery

## 2019-08-08 VITALS — BP 148/85 | HR 89 | Temp 97.7°F | Ht 65.0 in | Wt 263.0 lb

## 2019-08-08 DIAGNOSIS — K439 Ventral hernia without obstruction or gangrene: Secondary | ICD-10-CM

## 2019-08-08 NOTE — Progress Notes (Signed)
Outpatient Surgical Follow Up  08/08/2019  Whitney [name] is an 69 y.o. female.   Chief Complaint  Patient presents with  . Follow-up    ventral hernia    HPI: [name] is a 69 year old female well-known to me with a prior history of cholecystectomy developed a ventral hernia and now has intermittent abdominal pain that is sharp associated with a bulge.  Pain is mild to moderate intensity worsening with Valsalva and when she stands up.  No fevers no chills no nausea no vomiting no weight loss.  She does walk with a cane has a pending right knee replacement.  She recently underwent an incomplete colonoscopy and is to have another one due to poor prep.  I have personally reviewed her CT scan and have shared the images with her.  There is 2 defects 1 supraumbilical defect measuring approximately 3 cm and another inferior defect that is small.   Past Medical History:  Diagnosis Date  . Abnormal Pap smear of cervix    Pt states she had colposcopy   . Arthritis   . Chronic kidney disease    removed left kidney  . Dyspnea   . Dysrhythmia   . Hx of unilateral nephrectomy 1979   Left Nephrectomy  . Hypertension   . Lower extremity edema   . Palpitations   . PMB (postmenopausal bleeding)   . Restless leg syndrome   . Sleep apnea    has no cpap  . Vitamin D deficiency     Past Surgical History:  Procedure Laterality Date  . CHOLECYSTECTOMY    . COLONOSCOPY WITH PROPOFOL N/A 06/13/2019   Procedure: COLONOSCOPY WITH PROPOFOL;  Surgeon: [name], MD;  Location: [facility] ENDOSCOPY;  Service: Gastroenterology;  Laterality: N/A;  . ESOPHAGOGASTRODUODENOSCOPY (EGD) WITH PROPOFOL N/A 06/13/2019   Procedure: ESOPHAGOGASTRODUODENOSCOPY (EGD) WITH PROPOFOL;  Surgeon: [name], MD;  Location: [facility] ENDOSCOPY;  Service: Gastroenterology;  Laterality: N/A;  Pt will go for COVID test on [id number] as she uses public transportation and will be in the area on that day.   Marland Kitchen HYSTEROSCOPY WITH D & C N/A  02/16/2017   Procedure: DILATATION AND CURETTAGE /HYSTEROSCOPY;  Surgeon: Defrancesco, Alanda Slim, MD;  Location: [facility] ORS;  Service: Gynecology;  Laterality: N/A;  . JOINT REPLACEMENT    . kidney removal Left   . KIDNEY SURGERY Left 1979   left kidney removed  . LEFT HEART CATH AND CORONARY ANGIOGRAPHY N/A 10/21/2017   Procedure: LEFT HEART CATH AND CORONARY ANGIOGRAPHY;  Surgeon: Teodoro Spray, MD;  Location: [place] [facility];  Service: Cardiovascular;  Laterality: N/A;  . TOTAL KNEE ARTHROPLASTY Left 07/06/2017   Procedure: TOTAL KNEE ARTHROPLASTY;  Surgeon: Lovell Sheehan, MD;  Location: [facility] ORS;  Service: Orthopedics;  Laterality: Left;  . WRIST ARTHROSCOPY Left 1970s    Family History  Problem Relation Age of Onset  . Ovarian cancer Mother   . Hypertension Father   . Diabetes Father   . Cancer Father   . Breast cancer Sister   . Diabetes Sister     Social History:  reports that she has never smoked. She has never used smokeless tobacco. She reports current alcohol use of about 2.0 standard drinks of alcohol per week. She reports current drug use. Frequency: 3.00 times per week. Drug: Marijuana.  Allergies:  Allergies  Allergen Reactions  . Enalapril Swelling  . Lisinopril Swelling    Medications reviewed.    ROS Full ROS performed and is otherwise  negative other than what is stated in HPI   BP (!) 148/85   Pulse 89   Temp 97.7 F (36.5 C) (Temporal)   Ht 5\' 5"  (1.651 m)   Wt 263 lb (119.3 kg)   LMP 01/26/2017 Comment: age 74  SpO2 96%   BMI 43.77 kg/m   Physical Exam Vitals and nursing note reviewed. Exam conducted with a chaperone present.  Constitutional:      Appearance: Normal appearance. She is obese.  Eyes:     General:        Right eye: No discharge.        Left eye: No discharge.  Cardiovascular:     Rate and Rhythm: Normal rate and regular rhythm.  Pulmonary:     Effort: Pulmonary effort is normal. No respiratory distress.     Breath  sounds: Normal breath sounds. No stridor.  Abdominal:     General: Abdomen is flat. There is no distension.     Tenderness: There is no abdominal tenderness. There is no guarding.     Hernia: A hernia is present.     Comments: There is an chronically incarcerated epigastric ventral hernia measuring approximately 3-1/2 cm.  It is tender to palpation.  No peritonitis.  There is an additional umbilical defect that is nontender and reduces easily.  Musculoskeletal:     Cervical back: Normal range of motion. No rigidity.  Lymphadenopathy:     Cervical: No cervical adenopathy.  Skin:    General: Skin is warm and dry.     Capillary Refill: Capillary refill takes less than 2 seconds.  Neurological:     General: No focal deficit present.     Mental Status: She is alert and oriented to person, place, and time.  Psychiatric:        Mood and Affect: Mood normal.        Behavior: Behavior normal.        Thought Content: Thought content normal.        Judgment: Judgment normal.       Assessment/Plan: [name] is a 69 year old female with a symptomatic ventral hernia in need for repair.  I have discussed with the patient in detail about her disease process and the rationale for repair.  She does have an additional umbilical hernia that that we will be addressing at the same time.  Procedure discussed with patient in detail.  Risk, benefits and possible implications including but not limited to: Bleeding, infection, recurrence, chronic pain and injury to adjacent structures.  I do think that she will be a good candidate for robotic surgery.  Greater than 50% of the 60minutes  visit was spent in counseling/coordination of care   Caroleen Hamman, MD [place] Surgeon

## 2019-08-08 NOTE — Patient Instructions (Signed)
Our surgery scheduler Levada Dy will contact you within the next 24-48hrs. Please have the BLUE sheet available when she contacts you. Levada Dy will discuss the type of surgery and the prep for the surgery. If you have any questions or concerns, please do not hesitate to contact our office.     Ventral Hernia  A ventral hernia is a bulge of tissue from inside the abdomen that pushes through a weak area of the muscles that form the front wall of the abdomen. The tissues inside the abdomen are inside a sac (peritoneum). These tissues include the small intestine, large intestine, and the fatty tissue that covers the intestines (omentum). Sometimes, the bulge that forms a hernia contains intestines. Other hernias contain only fat. Ventral hernias do not go away without surgical treatment. There are several types of ventral hernias. You may have:  A hernia at an incision site from previous abdominal surgery (incisional hernia).  A hernia just above the belly button (epigastric hernia), or at the belly button (umbilical hernia). These types of hernias can develop from heavy lifting or straining.  A hernia that comes and goes (reducible hernia). It may be visible only when you lift or strain. This type of hernia can be pushed back into the abdomen (reduced).  A hernia that traps abdominal tissue inside the hernia (incarcerated hernia). This type of hernia does not reduce.  A hernia that cuts off blood flow to the tissues inside the hernia (strangulated hernia). The tissues can start to die if this happens. This is a very painful bulge that cannot be reduced. A strangulated hernia is a medical emergency. What are the causes? This condition is caused by abdominal tissue putting pressure on an area of weakness in the abdominal muscles. What increases the risk? The following factors may make you more likely to develop this condition:  Being female.  Being 26 or older.  Being overweight or obese.  Having  had previous abdominal surgery, especially if there was an infection after surgery.  Having had an injury to the abdominal wall.  Having had several pregnancies.  Having a buildup of fluid inside the abdomen (ascites). What are the signs or symptoms? The only symptom of a ventral hernia may be a painless bulge in the abdomen. A reducible hernia may be visible only when you strain, cough, or lift. Other symptoms may include:  Dull pain.  A feeling of pressure. Signs and symptoms of a strangulated hernia may include:  Increasing pain.  Nausea and vomiting.  Pain when pressing on the hernia.  The skin over the hernia turning red or purple.  Constipation.  Blood in the stool (feces). How is this diagnosed? This condition may be diagnosed based on:  Your symptoms.  Your medical history.  A physical exam. You may be asked to cough or strain while standing. These actions increase the pressure inside your abdomen and force the hernia through the opening in your muscles. Your health care provider may try to reduce the hernia by pressing on it.  Imaging studies, such as an ultrasound or CT scan. How is this treated? This condition is treated with surgery. If you have a strangulated hernia, surgery is done as soon as possible. If your hernia is small and not incarcerated, you may be asked to lose some weight before surgery. Follow these instructions at home:  Follow instructions from your health care provider about eating or drinking restrictions.  If you are overweight, your health care provider may recommend that  you increase your activity level and eat a healthier diet.  Do not lift anything that is heavier than 10 lb (4.5 kg).  Return to your normal activities as told by your health care provider. Ask your health care provider what activities are safe for you. You may need to avoid activities that increase pressure on your hernia.  Take over-the-counter and prescription  medicines only as told by your health care provider.  Keep all follow-up visits as told by your health care provider. This is important. Contact a health care provider if:  Your hernia gets larger.  Your hernia becomes painful. Get help right away if:  Your hernia becomes increasingly painful.  You have pain along with any of the following: ? Changes in skin color in the area of the hernia. ? Nausea. ? Vomiting. ? Fever. Summary  A ventral hernia is a bulge of tissue from inside the abdomen that pushes through a weak area of the muscles that form the front wall of the abdomen.  This condition is treated with surgery, which may be urgent depending on your hernia.  Do not lift anything that is heavier than 10 lb (4.5 kg), and follow activity instructions from your health care provider. This information is not intended to replace advice given to you by your health care provider. Make sure you discuss any questions you have with your health care provider. Document Revised: 09/02/2017 Document Reviewed: 02/09/2017 Elsevier Patient Education  [place].

## 2019-08-10 ENCOUNTER — Other Ambulatory Visit
Admission: RE | Admit: 2019-08-10 | Discharge: 2019-08-10 | Disposition: A | Discharge status: Home / Self Care | Payer: Medicare Other | Source: Ambulatory Visit | Attending: Gastroenterology | Admitting: Gastroenterology

## 2019-08-10 ENCOUNTER — Other Ambulatory Visit: Payer: Self-pay

## 2019-08-10 DIAGNOSIS — I493 Ventricular premature depolarization: Secondary | ICD-10-CM | POA: Diagnosis not present

## 2019-08-10 DIAGNOSIS — Z01812 Encounter for preprocedural laboratory examination: Secondary | ICD-10-CM | POA: Insufficient documentation

## 2019-08-10 DIAGNOSIS — M79604 Pain in right leg: Secondary | ICD-10-CM | POA: Diagnosis not present

## 2019-08-10 DIAGNOSIS — I10 Essential (primary) hypertension: Secondary | ICD-10-CM | POA: Diagnosis not present

## 2019-08-10 DIAGNOSIS — G4733 Obstructive sleep apnea (adult) (pediatric): Secondary | ICD-10-CM | POA: Diagnosis not present

## 2019-08-10 DIAGNOSIS — N1832 Chronic kidney disease, stage 3b: Secondary | ICD-10-CM | POA: Insufficient documentation

## 2019-08-10 DIAGNOSIS — Z20822 Contact with and (suspected) exposure to covid-19: Secondary | ICD-10-CM | POA: Insufficient documentation

## 2019-08-10 DIAGNOSIS — R29898 Other symptoms and signs involving the musculoskeletal system: Secondary | ICD-10-CM | POA: Diagnosis not present

## 2019-08-10 LAB — SARS CORONAVIRUS 2 (TAT 6-24 HRS): SARS Coronavirus 2: NEGATIVE

## 2019-08-12 ENCOUNTER — Ambulatory Visit: Payer: Medicare Other | Admitting: Anesthesiology

## 2019-08-12 ENCOUNTER — Encounter: Payer: Self-pay | Admitting: Gastroenterology

## 2019-08-12 ENCOUNTER — Ambulatory Visit
Admission: RE | Admit: 2019-08-12 | Discharge: 2019-08-12 | Disposition: A | Discharge status: Home / Self Care | Payer: Medicare Other | Attending: Gastroenterology | Admitting: Gastroenterology

## 2019-08-12 ENCOUNTER — Other Ambulatory Visit: Payer: Self-pay

## 2019-08-12 ENCOUNTER — Encounter
Admission: RE | Disposition: A | Discharge status: Home / Self Care | Payer: Self-pay | Source: Home / Self Care | Attending: Gastroenterology

## 2019-08-12 DIAGNOSIS — Z79899 Other long term (current) drug therapy: Secondary | ICD-10-CM | POA: Insufficient documentation

## 2019-08-12 DIAGNOSIS — G2581 Restless legs syndrome: Secondary | ICD-10-CM | POA: Insufficient documentation

## 2019-08-12 DIAGNOSIS — K635 Polyp of colon: Secondary | ICD-10-CM

## 2019-08-12 DIAGNOSIS — I129 Hypertensive chronic kidney disease with stage 1 through stage 4 chronic kidney disease, or unspecified chronic kidney disease: Secondary | ICD-10-CM | POA: Insufficient documentation

## 2019-08-12 DIAGNOSIS — Z7689 Persons encountering health services in other specified circumstances: Secondary | ICD-10-CM | POA: Diagnosis not present

## 2019-08-12 DIAGNOSIS — K5730 Diverticulosis of large intestine without perforation or abscess without bleeding: Secondary | ICD-10-CM | POA: Insufficient documentation

## 2019-08-12 DIAGNOSIS — I480 Paroxysmal atrial fibrillation: Secondary | ICD-10-CM | POA: Diagnosis not present

## 2019-08-12 DIAGNOSIS — I10 Essential (primary) hypertension: Secondary | ICD-10-CM | POA: Diagnosis not present

## 2019-08-12 DIAGNOSIS — E559 Vitamin D deficiency, unspecified: Secondary | ICD-10-CM | POA: Diagnosis not present

## 2019-08-12 DIAGNOSIS — G4730 Sleep apnea, unspecified: Secondary | ICD-10-CM | POA: Insufficient documentation

## 2019-08-12 DIAGNOSIS — Z96652 Presence of left artificial knee joint: Secondary | ICD-10-CM | POA: Diagnosis not present

## 2019-08-12 DIAGNOSIS — Z1211 Encounter for screening for malignant neoplasm of colon: Secondary | ICD-10-CM | POA: Diagnosis not present

## 2019-08-12 DIAGNOSIS — Z905 Acquired absence of kidney: Secondary | ICD-10-CM | POA: Diagnosis not present

## 2019-08-12 DIAGNOSIS — C189 Malignant neoplasm of colon, unspecified: Secondary | ICD-10-CM | POA: Diagnosis not present

## 2019-08-12 DIAGNOSIS — R1013 Epigastric pain: Secondary | ICD-10-CM

## 2019-08-12 DIAGNOSIS — R109 Unspecified abdominal pain: Secondary | ICD-10-CM | POA: Insufficient documentation

## 2019-08-12 DIAGNOSIS — K640 First degree hemorrhoids: Secondary | ICD-10-CM | POA: Diagnosis not present

## 2019-08-12 DIAGNOSIS — Z7982 Long term (current) use of aspirin: Secondary | ICD-10-CM | POA: Insufficient documentation

## 2019-08-12 HISTORY — PX: COLONOSCOPY WITH PROPOFOL: SHX5780

## 2019-08-12 SURGERY — COLONOSCOPY WITH PROPOFOL
Anesthesia: General

## 2019-08-12 MED ORDER — PROPOFOL 10 MG/ML IV BOLUS
INTRAVENOUS | Status: DC | PRN
  Administered 2019-08-12: 30 mg via INTRAVENOUS
  Administered 2019-08-12: 100 mg via INTRAVENOUS

## 2019-08-12 MED ORDER — PROPOFOL 500 MG/50ML IV EMUL
INTRAVENOUS | Status: AC
  Filled 2019-08-12: qty 50

## 2019-08-12 MED ORDER — SODIUM CHLORIDE 0.9 % IV SOLN: INTRAVENOUS | Status: DC

## 2019-08-12 MED ORDER — PROPOFOL 500 MG/50ML IV EMUL
INTRAVENOUS | Status: DC | PRN
  Administered 2019-08-12: 175 ug/kg/min via INTRAVENOUS

## 2019-08-12 NOTE — Op Note (Signed)
[facility] Gastroenterology Patient Name: Whitney [name] Procedure Date: 08/12/2019 9:06 AM MRN: [id number] Account #: [id number] Date of Birth: 1951-05-12 Admit Type: Outpatient Age: 69 Room: [facility] ENDO ROOM 3 Gender: Female Note Status: Finalized Procedure:             Colonoscopy Indications:           Abdominal pain Providers:             [name] MD, MD Referring MD:          Leretha Pol, NP Medicines:             Monitored Anesthesia Care Complications:         No immediate complications. Procedure:             Pre-Anesthesia Assessment:                        - Prior to the procedure, a History and Physical was                         performed, and patient medications, allergies and                         sensitivities were reviewed. The patient's tolerance                         of previous anesthesia was reviewed.                        - The risks and benefits of the procedure and the                         sedation options and risks were discussed with the                         patient. All questions were answered and informed                         consent was obtained.                        - ASA Grade Assessment: III - A patient with severe                         systemic disease.                        After obtaining informed consent, the colonoscope was                         passed under direct vision. Throughout the procedure,                         the patient's blood pressure, pulse, and oxygen                         saturations were monitored continuously. The                         Colonoscope was introduced through the anus and  advanced to the the cecum, identified by the                         appendiceal orifice. The colonoscopy was performed                         with ease. The patient tolerated the procedure well.                         The quality of the bowel preparation was  poor. Findings:      The perianal and digital rectal examinations were normal.      Non-bleeding internal hemorrhoids were found during retroflexion. The       hemorrhoids were medium-sized and Grade I (internal hemorrhoids that do       not prolapse).      Multiple small-mouthed diverticula were found in the sigmoid colon.      A 8 mm polyp was found in the cecum. The polyp was semi-pedunculated.       The polyp was removed with a cold snare. Resection and retrieval were       complete. To prevent bleeding after the polypectomy, two hemostatic       clips were successfully placed. There was no bleeding at the end of the       procedure.      The exam was otherwise without abnormality on direct and retroflexion       views. Impression:            - Preparation of the colon was poor.                        - Non-bleeding internal hemorrhoids.                        - Diverticulosis in the sigmoid colon.                        - One 8 mm polyp in the cecum, removed with a cold                         snare. Resected and retrieved. Clips were placed.                        - The examination was otherwise normal on direct and                         retroflexion views. Recommendation:        - Discharge patient to home (with escort).                        - Resume previous diet.                        - Continue present medications.                        - Await pathology results.                        - Repeat colonoscopy in 4 weeks because the bowel  preparation was suboptimal. Procedure Code(s):     --- Professional ---                        [phone], Colonoscopy, flexible; with removal of                         tumor(s), polyp(s), or other lesion(s) by snare                         technique Diagnosis Code(s):     --- Professional ---                        K64.0, First degree hemorrhoids                        K63.5, Polyp of colon                         R10.9, Unspecified abdominal pain                        K57.30, Diverticulosis of large intestine without                         perforation or abscess without bleeding CPT copyright 2019 American Medical Association. All rights reserved. The codes documented in this report are preliminary and upon coder review may  be revised to meet current compliance requirements. [name], MD [name] MD, MD 08/12/2019 9:32:25 AM This report has been signed electronically. Number of Addenda: 0 Note Initiated On: 08/12/2019 9:06 AM Scope Withdrawal Time: 0 hours 6 minutes 42 seconds  Total Procedure Duration: 0 hours 8 minutes 52 seconds  Estimated Blood Loss:  Estimated blood loss: none.      [facility]

## 2019-08-12 NOTE — H&P (Signed)
[name], MD [address], [place], [place], [place], [address] [address], [place], [place], [place], [address] Phone: [phone]  Fax: [phone]  Primary Care Physician:  Ronnell Freshwater, NP   Pre-Procedure History & Physical: HPI:  Whitney [name] is a 69 y.o. female is here for an colonoscopy.   Past Medical History:  Diagnosis Date  . Abnormal Pap smear of cervix    Pt states she had colposcopy   . Arthritis   . Chronic kidney disease    removed left kidney  . Dyspnea   . Dysrhythmia   . Hx of unilateral nephrectomy 1979   Left Nephrectomy  . Hypertension   . Lower extremity edema   . Palpitations   . PMB (postmenopausal bleeding)   . Restless leg syndrome   . Sleep apnea    has no cpap  . Vitamin D deficiency     Past Surgical History:  Procedure Laterality Date  . ABDOMINAL HYSTERECTOMY    . CARDIAC CATHETERIZATION    . CHOLECYSTECTOMY    . COLONOSCOPY WITH PROPOFOL N/A 06/13/2019   Procedure: COLONOSCOPY WITH PROPOFOL;  Surgeon: [name], MD;  Location: [facility] ENDOSCOPY;  Service: Gastroenterology;  Laterality: N/A;  . ESOPHAGOGASTRODUODENOSCOPY (EGD) WITH PROPOFOL N/A 06/13/2019   Procedure: ESOPHAGOGASTRODUODENOSCOPY (EGD) WITH PROPOFOL;  Surgeon: [name], MD;  Location: [facility] ENDOSCOPY;  Service: Gastroenterology;  Laterality: N/A;  Pt will go for COVID test on [id number] as she uses public transportation and will be in the area on that day.   Marland Kitchen HYSTEROSCOPY WITH D & C N/A 02/16/2017   Procedure: DILATATION AND CURETTAGE /HYSTEROSCOPY;  Surgeon: Defrancesco, Alanda Slim, MD;  Location: [facility] ORS;  Service: Gynecology;  Laterality: N/A;  . JOINT REPLACEMENT    . kidney removal Left   . KIDNEY SURGERY Left 1979   left kidney removed  . LEFT HEART CATH AND CORONARY ANGIOGRAPHY N/A 10/21/2017   Procedure: LEFT HEART CATH AND CORONARY ANGIOGRAPHY;  Surgeon: Teodoro Spray, MD;  Location: [place] [facility];  Service: Cardiovascular;   Laterality: N/A;  . TOTAL KNEE ARTHROPLASTY Left 07/06/2017   Procedure: TOTAL KNEE ARTHROPLASTY;  Surgeon: Lovell Sheehan, MD;  Location: [facility] ORS;  Service: Orthopedics;  Laterality: Left;  . WRIST ARTHROSCOPY Left 1970s    Prior to Admission medications   Medication Sig Start Date End Date Taking? Authorizing Provider  acetaminophen (TYLENOL) 500 MG tablet Take 1,000 mg by mouth every 6 (six) hours as needed for moderate pain or headache.   Yes [provider]  aspirin EC 81 MG tablet Take 81 mg by mouth daily.   Yes [provider]  atorvastatin (LIPITOR) 40 MG tablet Take 1 tablet (40 mg total) by mouth daily at 6 PM. 09/10/18  Yes Boscia, Greer Ee, NP  carvedilol (COREG) 3.125 MG tablet Take 2 tablets (6.25 mg total) by mouth 2 (two) times daily with a meal. 06/01/19  Yes Scarboro, Audie Clear, NP  Cholecalciferol (VITAMIN D) 50 MCG (2000 UT) tablet Take 2,000 Units by mouth daily.   Yes [provider]  diclofenac Sodium (VOLTAREN) 1 % GEL Voltaren 1 % topical gel  APPLY 2 GRAM TO THE AFFECTED AREA(S) BY TOPICAL ROUTE 4 TIMES PER DAY   Yes [provider]  famotidine (PEPCID) 40 MG tablet Take 1 tablet (40 mg total) by mouth daily. Patient taking differently: Take 40 mg by mouth daily as needed.  11/02/18  Yes [name], MD  flecainide (TAMBOCOR) 100 MG tablet Take 100 mg by mouth 2 (two) times daily.   Yes [provider]  hydrALAZINE (APRESOLINE) 25 MG tablet Take 1 tablet (25 mg total) by mouth 3 (three) times daily. 06/28/19  Yes Scarboro, Audie Clear, NP  meclizine (ANTIVERT) 25 MG tablet Take 1 tablet (25 mg total) by mouth 2 (two) times daily as needed for dizziness. 06/28/19  Yes Scarboro, Audie Clear, NP  nystatin ointment (MYCOSTATIN) Apply 1 application topically 2 (two) times daily as needed (knee pain).   Yes [provider]  rOPINIRole (REQUIP) 0.5 MG tablet Takes 1 or 2 tablets daily as needed Patient taking differently: Take 0.5 mg  by mouth 2 (two) times daily as needed (restless legs).  01/11/18  Yes Boscia, Greer Ee, NP  gabapentin (NEURONTIN) 100 MG capsule Take 1 capsule (100 mg total) by mouth 2 (two) times daily. Patient taking differently: Take 100 mg by mouth 2 (two) times daily as needed (pain).  10/22/17   Loletha Grayer, MD  omeprazole (PRILOSEC) 20 MG capsule Take 1 capsule (20 mg total) by mouth 2 (two) times daily for 14 days. 06/27/19 07/11/19  [name], MD  traMADol (ULTRAM) 50 MG tablet Take 1 tablet (50 mg total) by mouth every 6 (six) hours as needed. Patient taking differently: Take 50 mg by mouth every 6 (six) hours as needed for moderate pain.  09/10/18   Ronnell Freshwater, NP  traZODone (DESYREL) 50 MG tablet Take 1 tablet (50 mg total) by mouth at bedtime as needed for sleep. Patient not taking: Reported on 08/12/2019 09/10/18   Ronnell Freshwater, NP    Allergies as of 06/16/2019 - Review Complete 06/13/2019  Allergen Reaction Noted  . Enalapril Swelling   . Lisinopril Swelling 05/26/2016    Family History  Problem Relation Age of Onset  . Ovarian cancer Mother   . Hypertension Father   . Diabetes Father   . Cancer Father   . Breast cancer Sister   . Diabetes Sister     Social History   Socioeconomic History  . Marital status: Divorced    Spouse name: Not on file  . Number of children: Not on file  . Years of education: Not on file  . Highest education level: Not on file  Occupational History  . Not on file  Tobacco Use  . Smoking status: Never Smoker  . Smokeless tobacco: Never Used  Substance and Sexual Activity  . Alcohol use: Yes    Alcohol/week: 2.0 standard drinks    Types: 2 Standard drinks or equivalent per week    Comment: occasionally  . Drug use: Yes    Frequency: 3.0 times per week    Types: Marijuana, Cocaine    Comment: Pt states she uses for leg pain: marijuana; states no longer uses cocaine  . Sexual activity: Yes    Birth control/protection: None  Other  Topics Concern  . Not on file  Social History Narrative  . Not on file   Social Determinants of Health   Financial Resource Strain:   . Difficulty of Paying Living Expenses: Not on file  Food Insecurity:   . Worried About Charity fundraiser in the Last Year: Not on file  . Ran Out of Food in the Last Year: Not on file  Transportation Needs:   . Lack of Transportation (Medical): Not on file  . Lack of Transportation (Non-Medical): Not on file  Physical Activity:   .  Days of Exercise per Week: Not on file  . Minutes of Exercise per Session: Not on file  Stress:   . Feeling of Stress : Not on file  Social Connections:   . Frequency of Communication with Friends and Family: Not on file  . Frequency of Social Gatherings with Friends and Family: Not on file  . Attends Religious Services: Not on file  . Active Member of Clubs or Organizations: Not on file  . Attends Archivist Meetings: Not on file  . Marital Status: Not on file  Intimate Partner Violence:   . Fear of Current or Ex-Partner: Not on file  . Emotionally Abused: Not on file  . Physically Abused: Not on file  . Sexually Abused: Not on file    Review of Systems: See HPI, otherwise negative ROS  Physical Exam: BP (!) 157/96   Pulse 79   Temp 98.1 F (36.7 C) (Temporal)   Resp 20   Ht 5\' 5"  (1.651 m)   Wt 116.4 kg   LMP 01/26/2017 Comment: age 44  SpO2 99%   BMI 42.69 kg/m  General:   Alert,  pleasant and cooperative in NAD Head:  Normocephalic and atraumatic. Neck:  Supple; no masses or thyromegaly. Lungs:  Clear throughout to auscultation, normal respiratory effort.    Heart:  +S1, +S2, Regular rate and rhythm, No edema. Abdomen:  Soft, nontender and nondistended. Normal bowel sounds, without guarding, and without rebound.   Neurologic:  Alert and  oriented x4;  grossly normal neurologically.  Impression/Plan: Whitney [name] is here for an colonoscopy to be performed for abdominal pain Risks,  benefits, limitations, and alternatives regarding  colonoscopy have been reviewed with the patient.  Questions have been answered.  All parties agreeable.   [name], MD  08/12/2019, 9:08 AM

## 2019-08-12 NOTE — Anesthesia Preprocedure Evaluation (Signed)
Anesthesia Evaluation  Patient identified by MRN, date of birth, ID band Patient awake    Reviewed: Allergy & Precautions, NPO status , Patient's Chart, lab work & pertinent test results  History of Anesthesia Complications Negative for: history of anesthetic complications  Airway Mallampati: II  TM Distance: >3 FB Neck ROM: Full    Dental  (+) Partial Upper   Pulmonary sleep apnea , neg COPD,    breath sounds clear to auscultation- rhonchi (-) wheezing      Cardiovascular hypertension, Pt. on medications (-) CAD, (-) Past MI, (-) Cardiac Stents and (-) CABG  Rhythm:Regular Rate:Normal - Systolic murmurs and - Diastolic murmurs    Neuro/Psych neg Seizures negative neurological ROS  negative psych ROS   GI/Hepatic negative GI ROS, Neg liver ROS,   Endo/Other  negative endocrine ROSneg diabetes  Renal/GU Renal InsufficiencyRenal disease     Musculoskeletal  (+) Arthritis ,   Abdominal (+) + obese,   Peds  Hematology negative hematology ROS (+)   Anesthesia Other Findings Past Medical History: No date: Abnormal Pap smear of cervix     Comment:  Pt states she had colposcopy  No date: Arthritis No date: Chronic kidney disease     Comment:  removed left kidney No date: Dyspnea No date: Dysrhythmia 1979: Hx of unilateral nephrectomy     Comment:  Left Nephrectomy No date: Hypertension No date: Lower extremity edema No date: Palpitations No date: PMB (postmenopausal bleeding) No date: Restless leg syndrome No date: Sleep apnea     Comment:  has no cpap No date: Vitamin D deficiency   Reproductive/Obstetrics                             Anesthesia Physical Anesthesia Plan  ASA: III  Anesthesia Plan: General   Post-op Pain Management:    Induction: Intravenous  PONV Risk Score and Plan: 2 and Propofol infusion  Airway Management Planned: Natural Airway  Additional Equipment:    Intra-op Plan:   Post-operative Plan:   Informed Consent: I have reviewed the patients History and Physical, chart, labs and discussed the procedure including the risks, benefits and alternatives for the proposed anesthesia with the patient or authorized representative who has indicated his/her understanding and acceptance.     Dental advisory given  Plan Discussed with: CRNA and Anesthesiologist  Anesthesia Plan Comments:         Anesthesia Quick Evaluation

## 2019-08-12 NOTE — Anesthesia Postprocedure Evaluation (Signed)
Anesthesia Post Note  Patient: Whitney [name]  Procedure(s) Performed: COLONOSCOPY WITH PROPOFOL (N/A )  Patient location during evaluation: Endoscopy Anesthesia Type: General Level of consciousness: awake and alert and oriented Pain management: pain level controlled Vital Signs Assessment: post-procedure vital signs reviewed and stable Respiratory status: spontaneous breathing, nonlabored ventilation and respiratory function stable Cardiovascular status: blood pressure returned to baseline and stable Postop Assessment: no signs of nausea or vomiting Anesthetic complications: no     Last Vitals:  Vitals:   08/12/19 0945 08/12/19 1005  BP: 132/78 (!) 153/99  Pulse:    Resp:    Temp:    SpO2:      Last Pain:  Vitals:   08/12/19 1005  TempSrc:   PainSc: 0-No pain                 Whitney [name]

## 2019-08-12 NOTE — Transfer of Care (Signed)
Immediate Anesthesia Transfer of Care Note  Patient: Whitney [name]  Procedure(s) Performed: COLONOSCOPY WITH PROPOFOL (N/A )  Patient Location: PACU  Anesthesia Type:General  Level of Consciousness: sedated  Airway & Oxygen Therapy: Patient Spontanous Breathing and Patient connected to nasal cannula oxygen  Post-op Assessment: Report given to RN and Post -op Vital signs reviewed and stable  Post vital signs: Reviewed and stable  Last Vitals:  Vitals Value Taken Time  BP 123/73 08/12/19 0936  Temp 36.8 C 08/12/19 0935  Pulse 79 08/12/19 0937  Resp 23 08/12/19 0937  SpO2 97 % 08/12/19 0937  Vitals shown include unvalidated device data.  Last Pain:  Vitals:   08/12/19 0935  TempSrc: Temporal  PainSc:          Complications: No apparent anesthesia complications

## 2019-08-12 NOTE — Anesthesia Procedure Notes (Signed)
Date/Time: 08/12/2019 9:28 AM Performed by: Nelda Marseille, CRNA Pre-anesthesia Checklist: Patient identified, Emergency Drugs available, Suction available, Patient being monitored and Timeout performed Oxygen Delivery Method: Nasal cannula

## 2019-08-15 ENCOUNTER — Encounter: Payer: Self-pay | Admitting: Gastroenterology

## 2019-08-15 LAB — SURGICAL PATHOLOGY

## 2019-08-24 ENCOUNTER — Other Ambulatory Visit: Payer: Self-pay

## 2019-08-24 ENCOUNTER — Telehealth: Payer: Self-pay

## 2019-08-24 ENCOUNTER — Ambulatory Visit (INDEPENDENT_AMBULATORY_CARE_PROVIDER_SITE_OTHER): Payer: Medicare Other | Admitting: Adult Health

## 2019-08-24 ENCOUNTER — Encounter: Payer: Self-pay | Admitting: Adult Health

## 2019-08-24 VITALS — BP 187/88 | HR 82 | Ht 65.0 in

## 2019-08-24 DIAGNOSIS — M48061 Spinal stenosis, lumbar region without neurogenic claudication: Secondary | ICD-10-CM

## 2019-08-24 DIAGNOSIS — K635 Polyp of colon: Secondary | ICD-10-CM

## 2019-08-24 DIAGNOSIS — R1013 Epigastric pain: Secondary | ICD-10-CM

## 2019-08-24 DIAGNOSIS — R109 Unspecified abdominal pain: Secondary | ICD-10-CM

## 2019-08-24 DIAGNOSIS — M5386 Other specified dorsopathies, lumbar region: Secondary | ICD-10-CM

## 2019-08-24 DIAGNOSIS — I10 Essential (primary) hypertension: Secondary | ICD-10-CM | POA: Diagnosis not present

## 2019-08-24 DIAGNOSIS — M1711 Unilateral primary osteoarthritis, right knee: Secondary | ICD-10-CM

## 2019-08-24 MED ORDER — PEG 3350-KCL-NABCB-NACL-NASULF 236 G PO SOLR: 4000.0000 mL | Freq: Once | ORAL | 0 refills | Status: AC

## 2019-08-24 MED ORDER — PREDNISONE 10 MG PO TABS: ORAL_TABLET | ORAL | 0 refills | Status: DC

## 2019-08-24 MED ORDER — TRAMADOL HCL 50 MG PO TABS: 50.0000 mg | ORAL_TABLET | Freq: Four times a day (QID) | ORAL | 0 refills | Status: DC | PRN

## 2019-08-24 NOTE — Telephone Encounter (Signed)
Spoke with pt and informed her of biopsy result and Dr. Georgeann Oppenheim recommendation. Pt agrees and has scheduled her repeat colonoscopy. Pt is aware she'll receive her procedure prep instructions via mail.

## 2019-08-24 NOTE — Progress Notes (Signed)
[facility] [place], [place] [address]  Internal MEDICINE  Telephone Visit  Patient Name: Whitney [name]  [id number]  [id number]  Date of Service: 08/24/2019  I connected with the patient at 1101 by telephone and verified the patients identity using two identifiers.   I discussed the limitations, risks, security and privacy concerns of performing an evaluation and management service by telephone and the availability of in person appointments. I also discussed with the patient that there may be a patient responsible charge related to the service.  The patient expressed understanding and agrees to proceed.    Chief Complaint  Patient presents with  . Telephone Assessment  . Telephone Screen  . Hip Pain    left side pain radiating down to thigh, going on for 2 weeks, hard to walk   . Excessive Sweating    woke up feeling like she was drenched in water, drinking lots of water     HPI  Seen via video.  She reports two weeks of left leg pain that originates in the hip/buttock and radiates down to her foot. She has a significant history of sciatica, as well as spinal stenosis. She reports for about two weeks this left leg has hurt with any movement.  She has difficulty rolling over in bed.  She also had an episode of night sweats last night. She is currently taking gabapentin and is out of tramadol.      Current Medication: Outpatient Encounter Medications as of 08/24/2019  Medication Sig  . acetaminophen (TYLENOL) 500 MG tablet Take 1,000 mg by mouth every 6 (six) hours as needed for moderate pain or headache.  Marland Kitchen aspirin EC 81 MG tablet Take 81 mg by mouth daily.  Marland Kitchen atorvastatin (LIPITOR) 40 MG tablet Take 1 tablet (40 mg total) by mouth daily at 6 PM.  . carvedilol (COREG) 3.125 MG tablet Take 2 tablets (6.25 mg total) by mouth 2 (two) times daily with a meal.  . Cholecalciferol (VITAMIN D) 50 MCG (2000 UT) tablet Take 2,000 Units by mouth daily.  . diclofenac  Sodium (VOLTAREN) 1 % GEL Voltaren 1 % topical gel  APPLY 2 GRAM TO THE AFFECTED AREA(S) BY TOPICAL ROUTE 4 TIMES PER DAY  . famotidine (PEPCID) 40 MG tablet Take 1 tablet (40 mg total) by mouth daily. (Patient taking differently: Take 40 mg by mouth daily as needed. )  . flecainide (TAMBOCOR) 100 MG tablet Take 100 mg by mouth 2 (two) times daily.  Marland Kitchen gabapentin (NEURONTIN) 100 MG capsule Take 1 capsule (100 mg total) by mouth 2 (two) times daily. (Patient taking differently: Take 100 mg by mouth 2 (two) times daily as needed (pain). )  . hydrALAZINE (APRESOLINE) 25 MG tablet Take 1 tablet (25 mg total) by mouth 3 (three) times daily.  . meclizine (ANTIVERT) 25 MG tablet Take 1 tablet (25 mg total) by mouth 2 (two) times daily as needed for dizziness.  . nystatin ointment (MYCOSTATIN) Apply 1 application topically 2 (two) times daily as needed (knee pain).  Marland Kitchen rOPINIRole (REQUIP) 0.5 MG tablet Takes 1 or 2 tablets daily as needed (Patient taking differently: Take 0.5 mg by mouth 2 (two) times daily as needed (restless legs). )  . traMADol (ULTRAM) 50 MG tablet Take 1 tablet (50 mg total) by mouth every 6 (six) hours as needed for moderate pain.  . traZODone (DESYREL) 50 MG tablet Take 1 tablet (50 mg total) by mouth at bedtime as needed for sleep.  . [  DISCONTINUED] traMADol (ULTRAM) 50 MG tablet Take 1 tablet (50 mg total) by mouth every 6 (six) hours as needed. (Patient taking differently: Take 50 mg by mouth every 6 (six) hours as needed for moderate pain. )  . omeprazole (PRILOSEC) 20 MG capsule Take 1 capsule (20 mg total) by mouth 2 (two) times daily for 14 days.  . predniSONE (DELTASONE) 10 MG tablet Use per dose pack   No facility-administered encounter medications on file as of 08/24/2019.    Surgical History: Past Surgical History:  Procedure Laterality Date  . ABDOMINAL HYSTERECTOMY    . CARDIAC CATHETERIZATION    . CHOLECYSTECTOMY    . COLONOSCOPY WITH PROPOFOL N/A 06/13/2019    Procedure: COLONOSCOPY WITH PROPOFOL;  Surgeon: [name], MD;  Location: [facility] ENDOSCOPY;  Service: Gastroenterology;  Laterality: N/A;  . COLONOSCOPY WITH PROPOFOL N/A 08/12/2019   Procedure: COLONOSCOPY WITH PROPOFOL;  Surgeon: [name], MD;  Location: [facility] ENDOSCOPY;  Service: Gastroenterology;  Laterality: N/A;  . ESOPHAGOGASTRODUODENOSCOPY (EGD) WITH PROPOFOL N/A 06/13/2019   Procedure: ESOPHAGOGASTRODUODENOSCOPY (EGD) WITH PROPOFOL;  Surgeon: [name], MD;  Location: [facility] ENDOSCOPY;  Service: Gastroenterology;  Laterality: N/A;  Pt will go for COVID test on [id number] as she uses public transportation and will be in the area on that day.   Marland Kitchen HYSTEROSCOPY WITH D & C N/A 02/16/2017   Procedure: DILATATION AND CURETTAGE /HYSTEROSCOPY;  Surgeon: Defrancesco, Alanda Slim, MD;  Location: [facility] ORS;  Service: Gynecology;  Laterality: N/A;  . JOINT REPLACEMENT    . kidney removal Left   . KIDNEY SURGERY Left 1979   left kidney removed  . LEFT HEART CATH AND CORONARY ANGIOGRAPHY N/A 10/21/2017   Procedure: LEFT HEART CATH AND CORONARY ANGIOGRAPHY;  Surgeon: Teodoro Spray, MD;  Location: [facility];  Service: Cardiovascular;  Laterality: N/A;  . TOTAL KNEE ARTHROPLASTY Left 07/06/2017   Procedure: TOTAL KNEE ARTHROPLASTY;  Surgeon: Lovell Sheehan, MD;  Location: [facility] ORS;  Service: Orthopedics;  Laterality: Left;  . WRIST ARTHROSCOPY Left 1970s    Medical History: Past Medical History:  Diagnosis Date  . Abnormal Pap smear of cervix    Pt states she had colposcopy   . Arthritis   . Chronic kidney disease    removed left kidney  . Dyspnea   . Dysrhythmia   . Hx of unilateral nephrectomy 1979   Left Nephrectomy  . Hypertension   . Lower extremity edema   . Palpitations   . PMB (postmenopausal bleeding)   . Restless leg syndrome   . Sleep apnea    has no cpap  . Vitamin D deficiency     Family History: Family History  Problem Relation Age of Onset  . Ovarian cancer Mother    . Hypertension Father   . Diabetes Father   . Cancer Father   . Breast cancer Sister   . Diabetes Sister     Social History   Socioeconomic History  . Marital status: Divorced    Spouse name: Not on file  . Number of children: Not on file  . Years of education: Not on file  . Highest education level: Not on file  Occupational History  . Not on file  Tobacco Use  . Smoking status: Never Smoker  . Smokeless tobacco: Never Used  Substance and Sexual Activity  . Alcohol use: Yes    Alcohol/week: 2.0 standard drinks    Types: 2 Standard drinks or equivalent per week    Comment: occasionally  .  Drug use: Yes    Frequency: 3.0 times per week    Types: Marijuana, Cocaine    Comment: Pt states she uses for leg pain: marijuana; states no longer uses cocaine  . Sexual activity: Yes    Birth control/protection: None  Other Topics Concern  . Not on file  Social History Narrative  . Not on file   Social Determinants of Health   Financial Resource Strain:   . Difficulty of Paying Living Expenses: Not on file  Food Insecurity:   . Worried About Charity fundraiser in the Last Year: Not on file  . Ran Out of Food in the Last Year: Not on file  Transportation Needs:   . Lack of Transportation (Medical): Not on file  . Lack of Transportation (Non-Medical): Not on file  Physical Activity:   . Days of Exercise per Week: Not on file  . Minutes of Exercise per Session: Not on file  Stress:   . Feeling of Stress : Not on file  Social Connections:   . Frequency of Communication with Friends and Family: Not on file  . Frequency of Social Gatherings with Friends and Family: Not on file  . Attends Religious Services: Not on file  . Active Member of Clubs or Organizations: Not on file  . Attends Archivist Meetings: Not on file  . Marital Status: Not on file  Intimate Partner Violence:   . Fear of Current or Ex-Partner: Not on file  . Emotionally Abused: Not on file  .  Physically Abused: Not on file  . Sexually Abused: Not on file      Review of Systems  Constitutional: Negative for chills, fatigue and unexpected weight change.  HENT: Negative for congestion, rhinorrhea, sneezing and sore throat.   Eyes: Negative for photophobia, pain and redness.  Respiratory: Negative for cough, chest tightness and shortness of breath.   Cardiovascular: Negative for chest pain and palpitations.  Gastrointestinal: Negative for abdominal pain, constipation, diarrhea, nausea and vomiting.  Endocrine: Negative.   Genitourinary: Negative for dysuria and frequency.  Musculoskeletal: Negative for arthralgias, back pain, joint swelling and neck pain.  Skin: Negative for rash.  Allergic/Immunologic: Negative.   Neurological: Negative for tremors and numbness.       Sciatica pain down left leg.  Hematological: Negative for adenopathy. Does not bruise/bleed easily.  Psychiatric/Behavioral: Negative for behavioral problems and sleep disturbance. The patient is not nervous/anxious.     Vital Signs: BP (!) 187/88   Pulse 82   Ht 5\' 5"  (1.651 m)   LMP 01/26/2017 Comment: age 69  BMI 42.69 kg/m    Observation/Objective:  Well appearing, NAD noted.   Assessment/Plan: 1. Sciatica associated with disorder of lumbar spine Use prednisone as discussed.  Also discussed that night sweats could be symptom of larger issues in spinal cord.  Pt will call neurologist, for evaluation. - predniSONE (DELTASONE) 10 MG tablet; Use per dose pack  Dispense: 21 tablet; Refill: 0  2. Spinal stenosis of lumbar region without neurogenic claudication On previous CT, likely cause of today's issues.  Follow up as discussed with neurosurgery.  3. Essential hypertension Slightly elevated, likely due to pain.    4. Morbid obesity ([place]) Obesity Counseling: Risk Assessment: An assessment of behavioral risk factors was made today and includes lack of exercise sedentary lifestyle, lack of  portion control and poor dietary habits.  Risk Modification Advice: She was counseled on portion control guidelines. Restricting daily caloric intake to 1800. The  detrimental long term effects of obesity on her health and ongoing poor compliance was also discussed with the patient.  5. Primary osteoarthritis of right knee Reviewed risks and possible side effects associated with taking opiates, benzodiazepines and other CNS depressants. Combination of these could cause dizziness and drowsiness. Advised patient not to drive or operate machinery when taking these medications, as patient's and other's life can be at risk and will have consequences. Patient verbalized understanding in this matter. Dependence and abuse for these drugs will be monitored closely. A Controlled substance policy and procedure is on file which allows [facility] to order a urine drug screen test at any visit. Patient understands and agrees with the plan - traMADol (ULTRAM) 50 MG tablet; Take 1 tablet (50 mg total) by mouth every 6 (six) hours as needed for moderate pain.  Dispense: 15 tablet; Refill: 0  General Counseling: Cynthie verbalizes understanding of the findings of today's phone visit and agrees with plan of treatment. I have discussed any further diagnostic evaluation that may be needed or ordered today. We also reviewed her medications today. she has been encouraged to call the office with any questions or concerns that should arise related to todays visit.    No orders of the defined types were placed in this encounter.   Meds ordered this encounter  Medications  . predniSONE (DELTASONE) 10 MG tablet    Sig: Use per dose pack    Dispense:  21 tablet    Refill:  0  . traMADol (ULTRAM) 50 MG tablet    Sig: Take 1 tablet (50 mg total) by mouth every 6 (six) hours as needed for moderate pain.    Dispense:  15 tablet    Refill:  0    Time spent: [place] AGNP-C Internal  medicine

## 2019-08-24 NOTE — Telephone Encounter (Signed)
-----   Message from [name], MD sent at 08/22/2019  1:31 PM EST ----- Recent colonoscopy was poor prep - 2 days prep with clears before that   ----- Message ----- From: Interface, Lab In Three Zero One Sent: 08/15/2019   8:58 AM EST To: [name], MD

## 2019-08-30 ENCOUNTER — Other Ambulatory Visit: Payer: Self-pay | Admitting: Adult Health

## 2019-08-30 DIAGNOSIS — I10 Essential (primary) hypertension: Secondary | ICD-10-CM

## 2019-09-01 DIAGNOSIS — H5213 Myopia, bilateral: Secondary | ICD-10-CM | POA: Diagnosis not present

## 2019-09-01 DIAGNOSIS — H2513 Age-related nuclear cataract, bilateral: Secondary | ICD-10-CM | POA: Diagnosis not present

## 2019-09-02 DIAGNOSIS — G2581 Restless legs syndrome: Secondary | ICD-10-CM | POA: Diagnosis not present

## 2019-09-02 DIAGNOSIS — R531 Weakness: Secondary | ICD-10-CM | POA: Insufficient documentation

## 2019-09-02 DIAGNOSIS — M542 Cervicalgia: Secondary | ICD-10-CM | POA: Diagnosis not present

## 2019-09-02 DIAGNOSIS — M79605 Pain in left leg: Secondary | ICD-10-CM | POA: Diagnosis not present

## 2019-09-02 DIAGNOSIS — R519 Headache, unspecified: Secondary | ICD-10-CM | POA: Diagnosis not present

## 2019-09-15 ENCOUNTER — Other Ambulatory Visit: Payer: Self-pay | Admitting: Nephrology

## 2019-09-15 ENCOUNTER — Other Ambulatory Visit: Payer: Medicare Other

## 2019-09-15 ENCOUNTER — Other Ambulatory Visit: Payer: Self-pay

## 2019-09-15 ENCOUNTER — Other Ambulatory Visit
Admission: RE | Admit: 2019-09-15 | Discharge: 2019-09-15 | Disposition: A | Discharge status: Home / Self Care | Payer: Medicare Other | Source: Ambulatory Visit | Attending: Gastroenterology | Admitting: Gastroenterology

## 2019-09-15 DIAGNOSIS — N1832 Chronic kidney disease, stage 3b: Secondary | ICD-10-CM | POA: Diagnosis not present

## 2019-09-15 DIAGNOSIS — Z20822 Contact with and (suspected) exposure to covid-19: Secondary | ICD-10-CM | POA: Diagnosis not present

## 2019-09-15 DIAGNOSIS — R809 Proteinuria, unspecified: Secondary | ICD-10-CM | POA: Diagnosis not present

## 2019-09-15 DIAGNOSIS — I10 Essential (primary) hypertension: Secondary | ICD-10-CM | POA: Diagnosis not present

## 2019-09-15 DIAGNOSIS — Z01812 Encounter for preprocedural laboratory examination: Secondary | ICD-10-CM | POA: Insufficient documentation

## 2019-09-15 LAB — SARS CORONAVIRUS 2 (TAT 6-24 HRS): SARS Coronavirus 2: NEGATIVE

## 2019-09-16 ENCOUNTER — Encounter: Payer: Self-pay | Admitting: Gastroenterology

## 2019-09-19 ENCOUNTER — Encounter
Admission: RE | Disposition: A | Discharge status: Home / Self Care | Payer: Self-pay | Source: Home / Self Care | Attending: Gastroenterology

## 2019-09-19 ENCOUNTER — Encounter: Payer: Self-pay | Admitting: Gastroenterology

## 2019-09-19 ENCOUNTER — Ambulatory Visit
Admission: RE | Admit: 2019-09-19 | Discharge: 2019-09-19 | Disposition: A | Discharge status: Home / Self Care | Payer: Medicare Other | Attending: Gastroenterology | Admitting: Gastroenterology

## 2019-09-19 ENCOUNTER — Ambulatory Visit: Payer: Medicare Other | Admitting: Registered Nurse

## 2019-09-19 ENCOUNTER — Other Ambulatory Visit: Payer: Self-pay

## 2019-09-19 DIAGNOSIS — Z905 Acquired absence of kidney: Secondary | ICD-10-CM | POA: Diagnosis not present

## 2019-09-19 DIAGNOSIS — K635 Polyp of colon: Secondary | ICD-10-CM

## 2019-09-19 DIAGNOSIS — I10 Essential (primary) hypertension: Secondary | ICD-10-CM | POA: Diagnosis not present

## 2019-09-19 DIAGNOSIS — D122 Benign neoplasm of ascending colon: Secondary | ICD-10-CM | POA: Diagnosis not present

## 2019-09-19 DIAGNOSIS — Z8249 Family history of ischemic heart disease and other diseases of the circulatory system: Secondary | ICD-10-CM | POA: Insufficient documentation

## 2019-09-19 DIAGNOSIS — Z8719 Personal history of other diseases of the digestive system: Secondary | ICD-10-CM | POA: Insufficient documentation

## 2019-09-19 DIAGNOSIS — R1013 Epigastric pain: Secondary | ICD-10-CM | POA: Diagnosis not present

## 2019-09-19 DIAGNOSIS — Z1211 Encounter for screening for malignant neoplasm of colon: Secondary | ICD-10-CM | POA: Insufficient documentation

## 2019-09-19 DIAGNOSIS — G2581 Restless legs syndrome: Secondary | ICD-10-CM | POA: Diagnosis not present

## 2019-09-19 DIAGNOSIS — Z7982 Long term (current) use of aspirin: Secondary | ICD-10-CM | POA: Insufficient documentation

## 2019-09-19 DIAGNOSIS — G4730 Sleep apnea, unspecified: Secondary | ICD-10-CM | POA: Diagnosis not present

## 2019-09-19 DIAGNOSIS — M1990 Unspecified osteoarthritis, unspecified site: Secondary | ICD-10-CM | POA: Diagnosis not present

## 2019-09-19 DIAGNOSIS — Z86010 Personal history of colonic polyps: Secondary | ICD-10-CM

## 2019-09-19 DIAGNOSIS — I129 Hypertensive chronic kidney disease with stage 1 through stage 4 chronic kidney disease, or unspecified chronic kidney disease: Secondary | ICD-10-CM | POA: Diagnosis not present

## 2019-09-19 DIAGNOSIS — Z79899 Other long term (current) drug therapy: Secondary | ICD-10-CM | POA: Insufficient documentation

## 2019-09-19 DIAGNOSIS — N189 Chronic kidney disease, unspecified: Secondary | ICD-10-CM | POA: Diagnosis not present

## 2019-09-19 DIAGNOSIS — R109 Unspecified abdominal pain: Secondary | ICD-10-CM

## 2019-09-19 DIAGNOSIS — E782 Mixed hyperlipidemia: Secondary | ICD-10-CM | POA: Diagnosis not present

## 2019-09-19 HISTORY — PX: COLONOSCOPY WITH PROPOFOL: SHX5780

## 2019-09-19 LAB — URINE DRUG SCREEN, QUALITATIVE (ARMC ONLY)
Amphetamines, Ur Screen: NOT DETECTED
Barbiturates, Ur Screen: NOT DETECTED
Benzodiazepine, Ur Scrn: NOT DETECTED
Cannabinoid 50 Ng, Ur ~~LOC~~: POSITIVE — AB
Cocaine Metabolite,Ur ~~LOC~~: NOT DETECTED
MDMA (Ecstasy)Ur Screen: NOT DETECTED
Methadone Scn, Ur: NOT DETECTED
Opiate, Ur Screen: NOT DETECTED
Phencyclidine (PCP) Ur S: NOT DETECTED
Tricyclic, Ur Screen: NOT DETECTED

## 2019-09-19 SURGERY — COLONOSCOPY WITH PROPOFOL
Anesthesia: General

## 2019-09-19 MED ORDER — LIDOCAINE HCL (PF) 2 % IJ SOLN
INTRAMUSCULAR | Status: AC
  Filled 2019-09-19: qty 5

## 2019-09-19 MED ORDER — PROPOFOL 500 MG/50ML IV EMUL
INTRAVENOUS | Status: DC | PRN
  Administered 2019-09-19: 140 ug/kg/min via INTRAVENOUS

## 2019-09-19 MED ORDER — SUCCINYLCHOLINE CHLORIDE 20 MG/ML IJ SOLN
INTRAMUSCULAR | Status: AC
  Filled 2019-09-19: qty 2

## 2019-09-19 MED ORDER — PROPOFOL 500 MG/50ML IV EMUL
INTRAVENOUS | Status: AC
  Filled 2019-09-19: qty 100

## 2019-09-19 MED ORDER — PROPOFOL 500 MG/50ML IV EMUL
INTRAVENOUS | Status: AC
  Filled 2019-09-19: qty 200

## 2019-09-19 MED ORDER — PROPOFOL 10 MG/ML IV BOLUS
INTRAVENOUS | Status: DC | PRN
  Administered 2019-09-19: 70 mg via INTRAVENOUS

## 2019-09-19 MED ORDER — SODIUM CHLORIDE 0.9 % IV SOLN: INTRAVENOUS | Status: DC

## 2019-09-19 MED ORDER — ONDANSETRON HCL 4 MG/2ML IJ SOLN
4.0000 mg | Freq: Once | INTRAMUSCULAR | Status: AC
  Administered 2019-09-19: 4 mg via INTRAVENOUS

## 2019-09-19 MED ORDER — PROPOFOL 10 MG/ML IV BOLUS
INTRAVENOUS | Status: AC
  Filled 2019-09-19: qty 20

## 2019-09-19 MED ORDER — ONDANSETRON HCL 4 MG/2ML IJ SOLN
INTRAMUSCULAR | Status: AC
  Filled 2019-09-19: qty 2

## 2019-09-19 MED ORDER — PHENYLEPHRINE HCL (PRESSORS) 10 MG/ML IV SOLN
INTRAVENOUS | Status: AC
  Filled 2019-09-19: qty 1

## 2019-09-19 NOTE — Transfer of Care (Signed)
Immediate Anesthesia Transfer of Care Note  Patient: Whitney [name]  Procedure(s) Performed: COLONOSCOPY WITH PROPOFOL (N/A )  Patient Location: PACU  Anesthesia Type:General  Level of Consciousness: awake  Airway & Oxygen Therapy: Patient Spontanous Breathing and Patient connected to nasal cannula oxygen  Post-op Assessment: Report given to RN and Post -op Vital signs reviewed and stable  Post vital signs: Reviewed and stable  Last Vitals:  Vitals Value Taken Time  BP    Temp    Pulse    Resp    SpO2      Last Pain:  Vitals:   09/19/19 0728  TempSrc: Temporal  PainSc: 0-No pain         Complications: No apparent anesthesia complications

## 2019-09-19 NOTE — Op Note (Signed)
[facility] Gastroenterology Patient Name: Whitney [name] Procedure Date: 09/19/2019 8:09 AM MRN: [id number] Account #: [id number] Date of Birth: [birth year] Admit Type: Outpatient Age: 69 Room: [facility] ENDO ROOM 4 Gender: Female Note Status: Finalized Procedure:             Colonoscopy Indications:           High risk colon cancer surveillance: Personal history                         of colonic polyps Providers:             [name] MD, MD Referring MD:          No Local Md, MD (Referring MD) Medicines:             Monitored Anesthesia Care Complications:         No immediate complications. Procedure:             Pre-Anesthesia Assessment:                        - Prior to the procedure, a History and Physical was                         performed, and patient medications, allergies and                         sensitivities were reviewed. The patient's tolerance                         of previous anesthesia was reviewed.                        - The risks and benefits of the procedure and the                         sedation options and risks were discussed with the                         patient. All questions were answered and informed                         consent was obtained.                        - ASA Grade Assessment: II - A patient with mild                         systemic disease.                        After obtaining informed consent, the colonoscope was                         passed under direct vision. Throughout the procedure,                         the patient's blood pressure, pulse, and oxygen                         saturations were monitored continuously. The  Colonoscope was introduced through the anus and                         advanced to the the cecum, identified by the                         appendiceal orifice. The colonoscopy was performed                         with ease. The patient tolerated the  procedure well.                         The quality of the bowel preparation was poor. Findings:      The perianal and digital rectal examinations were normal.      Three sessile polyps were found in the ascending colon. The polyps were       4 to 6 mm in size. These polyps were removed with a cold snare.       Resection was complete, but the polyp tissue was not retrieved.      Copious quantities of semi-liquid stool was found in the ascending       colon, interfering with visualization. Impression:            - Preparation of the colon was poor.                        - Three 4 to 6 mm polyps in the ascending colon,                         removed with a cold snare. Complete resection. Polyp                         tissue not retrieved.                        - Stool in the ascending colon. Recommendation:        - Discharge patient to home (with escort).                        - Resume previous diet.                        - Continue present medications.                        - Await pathology results.                        - Repeat colonoscopy in 5 months because the bowel                         preparation was suboptimal. Procedure Code(s):     --- Professional ---                        [phone], Colonoscopy, flexible; with removal of                         tumor(s), polyp(s), or other lesion(s) by snare  technique Diagnosis Code(s):     --- Professional ---                        Z86.010, Personal history of colonic polyps                        K63.5, Polyp of colon CPT copyright 2019 [employer]. All rights reserved. The codes documented in this report are preliminary and upon coder review may  be revised to meet current compliance requirements. [name], MD [name] MD, MD 09/19/2019 8:27:31 AM This report has been signed electronically. Number of Addenda: 0 Note Initiated On: 09/19/2019 8:09 AM Scope Withdrawal Time: 0 hours 8  minutes 51 seconds  Total Procedure Duration: 0 hours 11 minutes 16 seconds  Estimated Blood Loss:  Estimated blood loss: none.      [facility]

## 2019-09-19 NOTE — H&P (Signed)
[name], MD [address], [place], [place], [place], [id number], [place], [place], [place], [address] Phone: [phone]  Fax: [phone]  Primary Care Physician:  Whitney Freshwater, NP   Pre-Procedure History & Physical: HPI:  Whitney [name] is a 69 y.o. female is here for an colonoscopy.   Past Medical History:  Diagnosis Date  . Abnormal Pap smear of cervix    Pt states she had colposcopy   . Arthritis   . Chronic kidney disease    removed left kidney  . Dyspnea   . Dysrhythmia   . Hx of unilateral nephrectomy 1979   Left Nephrectomy  . Hypertension   . Lower extremity edema   . Palpitations   . PMB (postmenopausal bleeding)   . Restless leg syndrome   . Sleep apnea    has no cpap  . Vitamin D deficiency     Past Surgical History:  Procedure Laterality Date  . ABDOMINAL HYSTERECTOMY    . CARDIAC CATHETERIZATION    . CHOLECYSTECTOMY    . COLONOSCOPY WITH PROPOFOL N/A 06/13/2019   Procedure: COLONOSCOPY WITH PROPOFOL;  Surgeon: [name], MD;  Location: [facility] ENDOSCOPY;  Service: Gastroenterology;  Laterality: N/A;  . COLONOSCOPY WITH PROPOFOL N/A 08/12/2019   Procedure: COLONOSCOPY WITH PROPOFOL;  Surgeon: [name], MD;  Location: [facility] ENDOSCOPY;  Service: Gastroenterology;  Laterality: N/A;  . ESOPHAGOGASTRODUODENOSCOPY (EGD) WITH PROPOFOL N/A 06/13/2019   Procedure: ESOPHAGOGASTRODUODENOSCOPY (EGD) WITH PROPOFOL;  Surgeon: [name], MD;  Location: [facility] ENDOSCOPY;  Service: Gastroenterology;  Laterality: N/A;  Pt will go for COVID test on [id number] as she uses public transportation and will be in the area on that day.   Marland Kitchen HYSTEROSCOPY WITH D & C N/A 02/16/2017   Procedure: DILATATION AND CURETTAGE /HYSTEROSCOPY;  Surgeon: Defrancesco, Alanda Slim, MD;  Location: [facility] ORS;  Service: Gynecology;  Laterality: N/A;  . JOINT REPLACEMENT    . kidney removal Left   . KIDNEY SURGERY Left 1979   left kidney removed  . LEFT HEART CATH AND CORONARY  ANGIOGRAPHY N/A 10/21/2017   Procedure: LEFT HEART CATH AND CORONARY ANGIOGRAPHY;  Surgeon: Teodoro Spray, MD;  Location: [place] CV LAB;  Service: Cardiovascular;  Laterality: N/A;  . TOTAL KNEE ARTHROPLASTY Left 07/06/2017   Procedure: TOTAL KNEE ARTHROPLASTY;  Surgeon: Lovell Sheehan, MD;  Location: [facility] ORS;  Service: Orthopedics;  Laterality: Left;  . WRIST ARTHROSCOPY Left 1970s    Prior to Admission medications   Medication Sig Start Date End Date Taking? Authorizing Provider  aspirin EC 81 MG tablet Take 81 mg by mouth daily.   Yes [provider]  atorvastatin (LIPITOR) 40 MG tablet Take 1 tablet (40 mg total) by mouth daily at 6 PM. 09/10/18  Yes Boscia, Heather E, NP  carvedilol (COREG) 3.125 MG tablet TAKE 2 TABLETS(6.25 MG) BY MOUTH TWICE DAILY WITH A MEAL 08/30/19  Yes Lavera Guise, MD  Cholecalciferol (VITAMIN D) 50 MCG (2000 UT) tablet Take 2,000 Units by mouth daily.   Yes [provider]  famotidine (PEPCID) 40 MG tablet Take 1 tablet (40 mg total) by mouth daily. Patient taking differently: Take 40 mg by mouth daily as needed.  11/02/18  Yes [name], MD  flecainide (TAMBOCOR) 100 MG tablet Take 100 mg by mouth 2 (two) times daily.   Yes [provider]  gabapentin (NEURONTIN) 100 MG capsule Take 1 capsule (100 mg total) by mouth 2 (two) times  daily. Patient taking differently: Take 100 mg by mouth 2 (two) times daily as needed (pain).  10/22/17  Yes Wieting, Richard, MD  hydrALAZINE (APRESOLINE) 25 MG tablet Take 1 tablet (25 mg total) by mouth 3 (three) times daily. 06/28/19  Yes Scarboro, Audie Clear, NP  rOPINIRole (REQUIP) 0.5 MG tablet Takes 1 or 2 tablets daily as needed Patient taking differently: Take 0.5 mg by mouth 2 (two) times daily as needed (restless legs).  01/11/18  Yes Boscia, Greer Ee, NP  traMADol (ULTRAM) 50 MG tablet Take 1 tablet (50 mg total) by mouth every 6 (six) hours as needed for moderate pain. 08/24/19  Yes Scarboro,  Audie Clear, NP  traZODone (DESYREL) 50 MG tablet Take 1 tablet (50 mg total) by mouth at bedtime as needed for sleep. 09/10/18  Yes Whitney Freshwater, NP  acetaminophen (TYLENOL) 500 MG tablet Take 1,000 mg by mouth every 6 (six) hours as needed for moderate pain or headache.    [provider]  diclofenac Sodium (VOLTAREN) 1 % GEL Voltaren 1 % topical gel  APPLY 2 GRAM TO THE AFFECTED AREA(S) BY TOPICAL ROUTE 4 TIMES PER DAY    [provider]  meclizine (ANTIVERT) 25 MG tablet Take 1 tablet (25 mg total) by mouth 2 (two) times daily as needed for dizziness. 06/28/19   Kendell Bane, NP  nystatin ointment (MYCOSTATIN) Apply 1 application topically 2 (two) times daily as needed (knee pain).    [provider]  omeprazole (PRILOSEC) 20 MG capsule Take 1 capsule (20 mg total) by mouth 2 (two) times daily for 14 days. 06/27/19 07/11/19  [name], MD  predniSONE (DELTASONE) 10 MG tablet Use per dose pack 08/24/19   Kendell Bane, NP    Allergies as of 08/24/2019 - Review Complete 08/24/2019  Allergen Reaction Noted  . Enalapril Swelling   . Lisinopril Swelling 05/26/2016    Family History  Problem Relation Age of Onset  . Ovarian cancer Mother   . Hypertension Father   . Diabetes Father   . Cancer Father   . Breast cancer Sister   . Diabetes Sister     Social History   Socioeconomic History  . Marital status: Divorced    Spouse name: Not on file  . Number of children: Not on file  . Years of education: Not on file  . Highest education level: Not on file  Occupational History  . Not on file  Tobacco Use  . Smoking status: Never Smoker  . Smokeless tobacco: Never Used  Substance and Sexual Activity  . Alcohol use: Yes    Alcohol/week: 2.0 standard drinks    Types: 2 Standard drinks or equivalent per week    Comment: occasionally  . Drug use: Yes    Frequency: 3.0 times per week    Types: Marijuana, Cocaine    Comment: Pt states she uses for leg  pain: marijuana; states no longer uses cocaine  . Sexual activity: Yes    Birth control/protection: None  Other Topics Concern  . Not on file  Social History Narrative  . Not on file   Social Determinants of Health   Financial Resource Strain:   . Difficulty of Paying Living Expenses: Not on file  Food Insecurity:   . Worried About Charity fundraiser in the Last Year: Not on file  . Ran Out of Food in the Last Year: Not on file  Transportation Needs:   . Lack of Transportation (Medical):  Not on file  . Lack of Transportation (Non-Medical): Not on file  Physical Activity:   . Days of Exercise per Week: Not on file  . Minutes of Exercise per Session: Not on file  Stress:   . Feeling of Stress : Not on file  Social Connections:   . Frequency of Communication with Friends and Family: Not on file  . Frequency of Social Gatherings with Friends and Family: Not on file  . Attends Religious Services: Not on file  . Active Member of Clubs or Organizations: Not on file  . Attends Archivist Meetings: Not on file  . Marital Status: Not on file  Intimate Partner Violence:   . Fear of Current or Ex-Partner: Not on file  . Emotionally Abused: Not on file  . Physically Abused: Not on file  . Sexually Abused: Not on file    Review of Systems: See HPI, otherwise negative ROS  Physical Exam: BP (!) 141/87   Pulse 72   Temp 97.9 F (36.6 C) (Temporal)   Resp 18   Ht 5\' 5"  (1.651 m)   Wt 116.6 kg   LMP 01/26/2017 Comment: age 6  SpO2 100%   BMI 42.77 kg/m  General:   Alert,  pleasant and cooperative in NAD Head:  Normocephalic and atraumatic. Neck:  Supple; no masses or thyromegaly. Lungs:  Clear throughout to auscultation, normal respiratory effort.    Heart:  +S1, +S2, Regular rate and rhythm, No edema. Abdomen:  Soft, nontender and nondistended. Normal bowel sounds, without guarding, and without rebound.   Neurologic:  Alert and  oriented x4;  grossly normal  neurologically.  Impression/Plan: Whitney [name] is here for an colonoscopy to be performed for Screening colonoscopy prior history of colon polyps.Risks, benefits, limitations, and alternatives regarding  colonoscopy have been reviewed with the patient.  Questions have been answered.  All parties agreeable.   [name], MD  09/19/2019, 8:08 AM

## 2019-09-19 NOTE — Anesthesia Preprocedure Evaluation (Signed)
Anesthesia Evaluation  Patient identified by MRN, date of birth, ID band Patient awake    Reviewed: Allergy & Precautions, H&P , NPO status , Patient's Chart, lab work & pertinent test results, reviewed documented beta blocker date and time   Airway Mallampati: II   Neck ROM: full    Dental  (+) Poor Dentition   Pulmonary neg pulmonary ROS, shortness of breath and with exertion, sleep apnea ,    Pulmonary exam normal        Cardiovascular Exercise Tolerance: Poor hypertension, On Medications negative cardio ROS Normal cardiovascular exam+ dysrhythmias Supra Ventricular Tachycardia  Rhythm:regular Rate:Normal     Neuro/Psych  Neuromuscular disease negative neurological ROS  negative psych ROS   GI/Hepatic negative GI ROS, Neg liver ROS,   Endo/Other  Morbid obesity  Renal/GU Renal diseasenegative Renal ROS  negative genitourinary   Musculoskeletal   Abdominal   Peds  Hematology negative hematology ROS (+)   Anesthesia Other Findings Past Medical History: No date: Abnormal Pap smear of cervix     Comment:  Pt states she had colposcopy  No date: Arthritis No date: Chronic kidney disease     Comment:  removed left kidney No date: Dyspnea No date: Dysrhythmia 1979: Hx of unilateral nephrectomy     Comment:  Left Nephrectomy No date: Hypertension No date: Lower extremity edema No date: Palpitations No date: PMB (postmenopausal bleeding) No date: Restless leg syndrome No date: Sleep apnea     Comment:  has no cpap No date: Vitamin D deficiency Past Surgical History: No date: ABDOMINAL HYSTERECTOMY No date: CARDIAC CATHETERIZATION No date: CHOLECYSTECTOMY 06/13/2019: COLONOSCOPY WITH PROPOFOL; N/A     Comment:  Procedure: COLONOSCOPY WITH PROPOFOL;  Surgeon: [name], MD;  Location: [facility] ENDOSCOPY;  Service:               Gastroenterology;  Laterality: N/A; 08/12/2019: COLONOSCOPY WITH  PROPOFOL; N/A     Comment:  Procedure: COLONOSCOPY WITH PROPOFOL;  Surgeon: [name], MD;  Location: [facility] ENDOSCOPY;  Service:               Gastroenterology;  Laterality: N/A; 06/13/2019: ESOPHAGOGASTRODUODENOSCOPY (EGD) WITH PROPOFOL; N/A     Comment:  Procedure: ESOPHAGOGASTRODUODENOSCOPY (EGD) WITH               PROPOFOL;  Surgeon: [name], MD;  Location: [facility]               ENDOSCOPY;  Service: Gastroenterology;  Laterality: N/A;               Pt will go for COVID test on 123456 as she uses public               transportation and will be in the area on that day.  02/16/2017: HYSTEROSCOPY WITH D & C; N/A     Comment:  Procedure: DILATATION AND CURETTAGE /HYSTEROSCOPY;                Surgeon: Defrancesco, Alanda Slim, MD;  Location: [facility] ORS;               Service: Gynecology;  Laterality: N/A; No date: JOINT REPLACEMENT No date: kidney removal; Left 1979: KIDNEY SURGERY; Left     Comment:  left kidney removed 10/21/2017: LEFT HEART CATH AND CORONARY ANGIOGRAPHY; N/A  Comment:  Procedure: LEFT HEART CATH AND CORONARY ANGIOGRAPHY;                Surgeon: Teodoro Spray, MD;  Location: [place] CV              LAB;  Service: Cardiovascular;  Laterality: N/A; 07/06/2017: TOTAL KNEE ARTHROPLASTY; Left     Comment:  Procedure: TOTAL KNEE ARTHROPLASTY;  Surgeon: Lovell Sheehan, MD;  Location: [facility] ORS;  Service: Orthopedics;               Laterality: Left; 1970s: WRIST ARTHROSCOPY; Left BMI    Body Mass Index: 42.77 kg/m     Reproductive/Obstetrics negative OB ROS                             Anesthesia Physical Anesthesia Plan  ASA: III  Anesthesia Plan: General   Post-op Pain Management:    Induction:   PONV Risk Score and Plan:   Airway Management Planned:   Additional Equipment:   Intra-op Plan:   Post-operative Plan:   Informed Consent: I have reviewed the patients History and Physical, chart, labs  and discussed the procedure including the risks, benefits and alternatives for the proposed anesthesia with the patient or authorized representative who has indicated his/her understanding and acceptance.     Dental Advisory Given  Plan Discussed with: CRNA  Anesthesia Plan Comments:         Anesthesia Quick Evaluation

## 2019-09-19 NOTE — OR Nursing (Signed)
C/o nauseau, zofran adm.

## 2019-09-20 ENCOUNTER — Telehealth: Payer: Self-pay

## 2019-09-20 ENCOUNTER — Encounter: Payer: Self-pay | Admitting: *Deleted

## 2019-09-20 LAB — SURGICAL PATHOLOGY

## 2019-09-20 NOTE — Telephone Encounter (Signed)
CONFIRMED 09-22-19.

## 2019-09-20 NOTE — Anesthesia Postprocedure Evaluation (Signed)
Anesthesia Post Note  Patient: Whitney [name]  Procedure(s) Performed: COLONOSCOPY WITH PROPOFOL (N/A )  Patient location during evaluation: PACU Anesthesia Type: General Level of consciousness: awake and alert Pain management: pain level controlled Vital Signs Assessment: post-procedure vital signs reviewed and stable Respiratory status: spontaneous breathing, nonlabored ventilation, respiratory function stable and patient connected to nasal cannula oxygen Cardiovascular status: blood pressure returned to baseline and stable Postop Assessment: no apparent nausea or vomiting Anesthetic complications: no     Last Vitals:  Vitals:   09/19/19 0900 09/19/19 0910  BP: 135/86 135/86  Pulse: 71 67  Resp: 17 13  Temp:    SpO2: 94% 95%    Last Pain:  Vitals:   09/20/19 0736  TempSrc:   PainSc: 0-No pain                 Molli Barrows

## 2019-09-21 ENCOUNTER — Other Ambulatory Visit: Payer: Self-pay

## 2019-09-21 ENCOUNTER — Telehealth: Payer: Self-pay

## 2019-09-21 ENCOUNTER — Ambulatory Visit (INDEPENDENT_AMBULATORY_CARE_PROVIDER_SITE_OTHER): Payer: Medicare Other | Admitting: Surgery

## 2019-09-21 ENCOUNTER — Encounter: Payer: Self-pay | Admitting: Surgery

## 2019-09-21 VITALS — BP 131/86 | HR 73 | Temp 97.9°F | Resp 13 | Ht 65.0 in | Wt 260.0 lb

## 2019-09-21 DIAGNOSIS — K432 Incisional hernia without obstruction or gangrene: Secondary | ICD-10-CM

## 2019-09-21 NOTE — Telephone Encounter (Signed)
Patient rescheduled appointment on 09/26/2019 to 10/11/2019. klh

## 2019-09-21 NOTE — Patient Instructions (Addendum)
Please contact [facility] to have your EUA scan done.  We will have you follow up here in about a month after this scan has been completed.

## 2019-09-21 NOTE — Progress Notes (Signed)
Outpatient Surgical Follow Up  09/21/2019  Whitney [name] is an 69 y.o. female.   Chief Complaint  Patient presents with  . Pre-op Exam    Ventral Hernia    HPI: Whitney [name] is a 69 year old female well-known to me with a prior history of cholecystectomy developed a ventral hernia and now has intermittent abdominal pain that is sharp associated with a bulge.  Pain is mild to moderate intensity worsening with Valsalva and when she stands up.  No fevers no chills no nausea no vomiting no weight loss.  She does walk with a cane has a pending right knee replacement.  She recently underwent an incomplete colonoscopy and is to have another one due to poor prep.  I have personally reviewed her CT scan and have shared the images with her.  There is 2 defects 1 supraumbilical defect measuring approximately 3 cm and another inferior defect that is small.  Also has a cystic lesion in the pancreas and Dr. Vicente Males from GI has recommended an endoscopic ultrasound.  For some reason the patient canceled the procedure.    Past Medical History:  Diagnosis Date  . Abnormal Pap smear of cervix    Pt states she had colposcopy   . Arthritis   . Chronic kidney disease    removed left kidney  . Dyspnea   . Dysrhythmia   . Hx of unilateral nephrectomy 1979   Left Nephrectomy  . Hypertension   . Lower extremity edema   . Palpitations   . PMB (postmenopausal bleeding)   . Restless leg syndrome   . Sleep apnea    has no cpap  . Vitamin D deficiency     Past Surgical History:  Procedure Laterality Date  . ABDOMINAL HYSTERECTOMY    . CARDIAC CATHETERIZATION    . CHOLECYSTECTOMY    . COLONOSCOPY WITH PROPOFOL N/A 06/13/2019   Procedure: COLONOSCOPY WITH PROPOFOL;  Surgeon: [name], MD;  Location: [facility] ENDOSCOPY;  Service: Gastroenterology;  Laterality: N/A;  . COLONOSCOPY WITH PROPOFOL N/A 08/12/2019   Procedure: COLONOSCOPY WITH PROPOFOL;  Surgeon: [name], MD;  Location: [facility] ENDOSCOPY;  Service:  Gastroenterology;  Laterality: N/A;  . COLONOSCOPY WITH PROPOFOL N/A 09/19/2019   Procedure: COLONOSCOPY WITH PROPOFOL;  Surgeon: [name], MD;  Location: [facility] ENDOSCOPY;  Service: Gastroenterology;  Laterality: N/A;  . ESOPHAGOGASTRODUODENOSCOPY (EGD) WITH PROPOFOL N/A 06/13/2019   Procedure: ESOPHAGOGASTRODUODENOSCOPY (EGD) WITH PROPOFOL;  Surgeon: [name], MD;  Location: [facility] ENDOSCOPY;  Service: Gastroenterology;  Laterality: N/A;  Pt will go for COVID test on 123456 as she uses public transportation and will be in the area on that day.   Marland Kitchen HYSTEROSCOPY WITH D & C N/A 02/16/2017   Procedure: DILATATION AND CURETTAGE /HYSTEROSCOPY;  Surgeon: Defrancesco, Alanda Slim, MD;  Location: [facility] ORS;  Service: Gynecology;  Laterality: N/A;  . JOINT REPLACEMENT    . kidney removal Left   . KIDNEY SURGERY Left 1979   left kidney removed  . LEFT HEART CATH AND CORONARY ANGIOGRAPHY N/A 10/21/2017   Procedure: LEFT HEART CATH AND CORONARY ANGIOGRAPHY;  Surgeon: Teodoro Spray, MD;  Location: [place] CV LAB;  Service: Cardiovascular;  Laterality: N/A;  . TOTAL KNEE ARTHROPLASTY Left 07/06/2017   Procedure: TOTAL KNEE ARTHROPLASTY;  Surgeon: Lovell Sheehan, MD;  Location: [facility] ORS;  Service: Orthopedics;  Laterality: Left;  . WRIST ARTHROSCOPY Left 1970s    Family History  Problem Relation Age of Onset  . Ovarian cancer Mother   .  Hypertension Father   . Diabetes Father   . Cancer Father   . Breast cancer Sister   . Diabetes Sister     Social History:  reports that she has never smoked. She has never used smokeless tobacco. She reports current alcohol use of about 2.0 standard drinks of alcohol per week. She reports current drug use. Frequency: 3.00 times per week. Drugs: Marijuana and Cocaine.  Allergies:  Allergies  Allergen Reactions  . Enalapril Swelling  . Lisinopril Swelling    Medications reviewed.    ROS Full ROS performed and is otherwise negative other than what is  stated in HPI   BP 131/86   Pulse 73   Temp 97.9 F (36.6 C)   Resp 13   Ht 5\' 5"  (1.651 m)   Wt 260 lb (117.9 kg)   LMP 01/26/2017 Comment: age 81  SpO2 95%   BMI 43.27 kg/m   Physical Exam Vitals and nursing note reviewed. Exam conducted with a chaperone present.  Constitutional:      General: She is not in acute distress.    Appearance: Normal appearance. She is obese.  Cardiovascular:     Rate and Rhythm: Normal rate.     Heart sounds: No murmur. No gallop.   Pulmonary:     Effort: Pulmonary effort is normal. No respiratory distress.     Breath sounds: No stridor. No wheezing.  Abdominal:     General: Abdomen is flat. There is no distension.     Palpations: There is no mass.     Tenderness: There is no abdominal tenderness. There is no guarding.     Hernia: No hernia is present.     Comments: Evidence of a reducible periumbilical ventral hernia.  No peritonitis.  Musculoskeletal:     Cervical back: Normal range of motion and neck supple.  Skin:    Capillary Refill: Capillary refill takes less than 2 seconds.  Neurological:     General: No focal deficit present.     Mental Status: She is alert and oriented to person, place, and time.  Psychiatric:        Mood and Affect: Mood normal.        Behavior: Behavior normal.        Thought Content: Thought content normal.        Judgment: Judgment normal.       Assessment/Plan:  69 year old female with 2 ventral hernias in need for repair.  Are symptomatic.  Given that she does have a pending endoscopic ultrasound to assess the pancreatic lesion this will take precedence.  I have discussed with her in detail about the importance of following up for her endoscopic ultrasound.  Once we have had this figured out I do think that is okay to schedule an elective robotic ventral hernia repair.  I have also discussed with her about importance of weight control and optimization. Greater than 50% of the 25 minutes  visit was  spent in counseling/coordination of care No need for emergent surgical procedure at this time  Caroleen Hamman, MD [place]

## 2019-09-22 ENCOUNTER — Ambulatory Visit: Payer: Medicare Other | Admitting: Nurse Practitioner

## 2019-09-23 ENCOUNTER — Ambulatory Visit: Payer: Medicare Other

## 2019-09-26 ENCOUNTER — Encounter: Payer: Self-pay | Admitting: Surgery

## 2019-09-27 ENCOUNTER — Encounter: Payer: Self-pay | Admitting: Gastroenterology

## 2019-09-27 ENCOUNTER — Other Ambulatory Visit: Payer: Self-pay

## 2019-09-27 ENCOUNTER — Ambulatory Visit (INDEPENDENT_AMBULATORY_CARE_PROVIDER_SITE_OTHER): Payer: Medicare Other | Admitting: Gastroenterology

## 2019-09-27 VITALS — BP 124/76 | HR 69 | Temp 98.1°F | Ht 65.0 in | Wt 256.8 lb

## 2019-09-27 DIAGNOSIS — K228 Other specified diseases of esophagus: Secondary | ICD-10-CM | POA: Diagnosis not present

## 2019-09-27 DIAGNOSIS — A048 Other specified bacterial intestinal infections: Secondary | ICD-10-CM | POA: Diagnosis not present

## 2019-09-27 DIAGNOSIS — R1013 Epigastric pain: Secondary | ICD-10-CM

## 2019-09-27 DIAGNOSIS — R935 Abnormal findings on diagnostic imaging of other abdominal regions, including retroperitoneum: Secondary | ICD-10-CM | POA: Diagnosis not present

## 2019-09-27 DIAGNOSIS — K2289 Other specified disease of esophagus: Secondary | ICD-10-CM

## 2019-09-27 NOTE — Progress Notes (Signed)
[name] MD, MRCP(U.K) [address]  [place]  [place], [place] [address]  Main: [phone]  Fax: [phone]   Primary Care Physician: Ronnell Freshwater, NP  Primary Gastroenterologist:  Dr. [name]   Follow-up H. pylori gastritis ,abnormal imaging of the pancreas, lesion of the GE junction HPI: Whitney [name] is a 69 y.o. female   Summary of history :  Initially seen and referred on 3/31/2020for abdominal pain.  Abdominal CT scan in March 2019 showed  A small low-attenuation area within the body of the pancreas.  MRI of the abdomen in May 2019 which demonstrated benign liver abnormalities including multiple hemangiomas and cysts. Gallstone.'s multiple small cystic foci throughout the pancreas. Findings may represent small pseudocysts or areas of sidebranch duct ectasia. Cystic neoplasm of the pancreas not excluded follow-up imaging in 24 months recommended.  10/19/2018 HIDA scan: Normal. 02/18/2019: H pylori breath test - negative 03/04/2019: MRCP- progression of the gall bladder polyp which is 10 mm in size . Pancreas lesion is stable repeat in 1 year . 06/13/2019 colonoscopy: Large quantity of stool seen in the rectum and sigmoid repeat colonoscopy recommended.   EGD.  Medium size hiatal hernia seen nodule seen just past the GE junction.  No biopsies taken was to undergo an EUS with Dr. Owens Loffler but the patient canceled the procedure.  Biopsies of the stomach demonstrated H. pylori gastritis.  Underwent laparoscopic cholecystectomy with Dr. Dahlia Byes in November 2020 which resolved her longstanding abdominal pain 05/27/2019: CT scan of the abdomen and pelvis showed moderate supraumbilical midline ventral hernia containing inflamed flat and small amount of fluid.  Stable small pancreatic cysts.  Followed up with Dr. Dahlia Byes on 06/08/2019 and plan is for surgery in the future for the ventral hernia.  Abdominal pain:Onset:2.5 years resolved after her gallbladder  surgery     Interval history11/23/2020-09/27/2019  09/19/2019: Colonoscopy: Poor prep and 3 polyps 4 to 6 mm were resected in the ascending colon with a cold snare.  Could not be retrieved since prep was poor 07/28/2019: Was scheduled for endoscopic ultrasound for lesion seen just past the GE junction.Marland Kitchen  Appears it was canceled patient states that she wanted to be done at a later point and called and canceled the same. 09/21/2019: Seen by Dr. Dahlia Byes: None for repair of the ventral hernia after surveillance of the pancreatic lesion which she has a MRI to be scheduled in July  Presently no abdominal pain.  No acute complaints.  Here to reschedule all prior tests.  Current Outpatient Medications  Medication Sig Dispense Refill  . acetaminophen (TYLENOL) 500 MG tablet Take 1,000 mg by mouth every 6 (six) hours as needed for moderate pain or headache.    Marland Kitchen amLODipine (NORVASC) 5 MG tablet Take 5 mg by mouth daily.    Marland Kitchen aspirin EC 81 MG tablet Take 81 mg by mouth daily.    Marland Kitchen atorvastatin (LIPITOR) 40 MG tablet Take 1 tablet (40 mg total) by mouth daily at 6 PM. 90 tablet 1  . carvedilol (COREG) 3.125 MG tablet TAKE 2 TABLETS(6.25 MG) BY MOUTH TWICE DAILY WITH A MEAL 120 tablet 3  . Cholecalciferol (VITAMIN D) 50 MCG (2000 UT) tablet Take 2,000 Units by mouth daily.    . diclofenac Sodium (VOLTAREN) 1 % GEL Voltaren 1 % topical gel  APPLY 2 GRAM TO THE AFFECTED AREA(S) BY TOPICAL ROUTE 4 TIMES PER DAY    . famotidine (PEPCID) 40 MG tablet Take 1 tablet (40 mg  total) by mouth daily. (Patient taking differently: Take 40 mg by mouth daily as needed. ) 90 tablet 1  . flecainide (TAMBOCOR) 100 MG tablet Take 100 mg by mouth 2 (two) times daily.    Marland Kitchen gabapentin (NEURONTIN) 100 MG capsule Take 1 capsule (100 mg total) by mouth 2 (two) times daily. (Patient taking differently: Take 100 mg by mouth 2 (two) times daily as needed (pain). ) 90 capsule 0  . hydrALAZINE (APRESOLINE) 25 MG tablet Take 1 tablet  (25 mg total) by mouth 3 (three) times daily. 90 tablet 3  . meclizine (ANTIVERT) 25 MG tablet Take 1 tablet (25 mg total) by mouth 2 (two) times daily as needed for dizziness. 20 tablet 0  . nystatin ointment (MYCOSTATIN) Apply 1 application topically 2 (two) times daily as needed (knee pain).    Marland Kitchen omeprazole (PRILOSEC) 20 MG capsule Take 1 capsule (20 mg total) by mouth 2 (two) times daily for 14 days. 28 capsule 0  . predniSONE (DELTASONE) 10 MG tablet Use per dose pack 21 tablet 0  . rOPINIRole (REQUIP) 0.5 MG tablet Takes 1 or 2 tablets daily as needed (Patient taking differently: Take 0.5 mg by mouth 2 (two) times daily as needed (restless legs). ) 90 tablet 1  . traMADol (ULTRAM) 50 MG tablet Take 1 tablet (50 mg total) by mouth every 6 (six) hours as needed for moderate pain. 15 tablet 0  . traZODone (DESYREL) 50 MG tablet Take 1 tablet (50 mg total) by mouth at bedtime as needed for sleep. 30 tablet 3   No current facility-administered medications for this visit.    Allergies as of 09/27/2019 - Review Complete 09/26/2019  Allergen Reaction Noted  . Enalapril Swelling   . Lisinopril Swelling 05/26/2016    ROS:  General: Negative for anorexia, weight loss, fever, chills, fatigue, weakness. ENT: Negative for hoarseness, difficulty swallowing , nasal congestion. CV: Negative for chest pain, angina, palpitations, dyspnea on exertion, peripheral edema.  Respiratory: Negative for dyspnea at rest, dyspnea on exertion, cough, sputum, wheezing.  GI: See history of present illness. GU:  Negative for dysuria, hematuria, urinary incontinence, urinary frequency, nocturnal urination.  Endo: Negative for unusual weight change.    Physical Examination:   LMP 01/26/2017 Comment: age 74  General: Well-nourished, well-developed in no acute distress.  Eyes: No icterus. Conjunctivae pink. Mouth: Oropharyngeal mucosa moist and pink , no lesions erythema or exudate. Lungs: Clear to auscultation  bilaterally. Non-labored. Heart: Regular rate and rhythm, no murmurs rubs or gallops.  Abdomen: Bowel sounds are normal, nontender, nondistended, no hepatosplenomegaly or masses, no abdominal bruits or hernia , no rebound or guarding.   Extremities: No lower extremity edema. No clubbing or deformities. Neuro: Alert and oriented x 3.  Grossly intact. Skin: Warm and dry, no jaundice.   Psych: Alert and cooperative, normal mood and affect.   Imaging Studies: No results found.  Assessment and Plan:   Whitney [name] is a 69 y.o. y/o female here to follow upfor abdominal pain. Appears to be longstanding. Previous abdominal imaging showed abnormalities of the pancreas and plan to repeat imaging in a year.Marland Kitchen HIDA normal.  Status post cholecystectomy for gallbladder polyp at 10 mm size .Marland Kitchen The pancreas lesion is stable.  Possible the abdominal symptoms in the past were due to the gallbladder polyp.  She also has a midline ventral hernia that is being planned for repair after evaluation of the pancreatic lesion in July as per note by Dr. Dahlia Byes  in February 2021.  H. pylori gastritis seen on EGD.  Plan :  1. Repeat colonoscopy due to poor prep with a 2-day prep and 2-day prior of clears in 2 to 3 months once she has completed the EUS 2. MRI pancreas in July 2021 3. Dr Dahlia Byes has a plan for repair of ventral hernia in the near future 4.  H. pylori gastritis: Status post treatment check H. pylori breath test for eradication  5.  Procedure evaluation of submucosal lesion with EUS to be is rescheduled  I have discussed alternative options, risks & benefits,  which include, but are not limited to, bleeding, infection, perforation,respiratory complication & drug reaction.  The patient agrees with this plan & written consent will be obtained.    Dr [name]  MD,MRCP [facility]) Follow up in 3-4 months

## 2019-09-29 ENCOUNTER — Other Ambulatory Visit: Payer: Self-pay

## 2019-09-29 DIAGNOSIS — E782 Mixed hyperlipidemia: Secondary | ICD-10-CM

## 2019-09-29 DIAGNOSIS — K3189 Other diseases of stomach and duodenum: Secondary | ICD-10-CM

## 2019-09-29 MED ORDER — ATORVASTATIN CALCIUM 40 MG PO TABS: 40.0000 mg | ORAL_TABLET | Freq: Every day | ORAL | 1 refills | Status: DC

## 2019-09-30 ENCOUNTER — Ambulatory Visit
Admission: RE | Admit: 2019-09-30 | Discharge: 2019-09-30 | Disposition: A | Discharge status: Home / Self Care | Payer: Medicare Other | Source: Ambulatory Visit | Attending: Nephrology | Admitting: Nephrology

## 2019-09-30 ENCOUNTER — Other Ambulatory Visit: Payer: Self-pay

## 2019-09-30 DIAGNOSIS — N1832 Chronic kidney disease, stage 3b: Secondary | ICD-10-CM | POA: Insufficient documentation

## 2019-09-30 DIAGNOSIS — N183 Chronic kidney disease, stage 3 unspecified: Secondary | ICD-10-CM | POA: Diagnosis not present

## 2019-10-03 ENCOUNTER — Telehealth: Payer: Self-pay

## 2019-10-03 NOTE — Telephone Encounter (Signed)
Mansouraty, Telford Nab., MD  Timothy Lasso, RN; Milus Banister, MD  Breckyn Ticas,  Patient had previously been scheduled with DJ but canceled procedure at end of December. As long as patient is going to truly follow-up, patient can be rescheduled with DJ or myself as nonurgent EUS. Thanks. GM       Previous Messages   ----- Message -----  From: Timothy Lasso, RN  Sent: 09/30/2019  9:37 AM EST  To: Irving Copas., MD    ----- Message -----  From: Hoy Register  Sent: 09/30/2019  9:15 AM EST  To: Timothy Lasso, RN   Good morning Jeanmarie Mccowen, please see this referral.

## 2019-10-03 NOTE — Telephone Encounter (Signed)
Left message on machine to call back  

## 2019-10-04 ENCOUNTER — Other Ambulatory Visit: Payer: Self-pay

## 2019-10-04 DIAGNOSIS — K3189 Other diseases of stomach and duodenum: Secondary | ICD-10-CM

## 2019-10-04 NOTE — Telephone Encounter (Signed)
The pt has been scheduled for EUS on 3/29 with Dr Rush Landmark at [facility] on 3/25 915 am EUS scheduled, pt instructed and medications reviewed.  Patient instructions mailed to home.  Patient to call with any questions or concerns.

## 2019-10-07 ENCOUNTER — Telehealth: Payer: Self-pay

## 2019-10-07 NOTE — Telephone Encounter (Signed)
CONFIRMED AND SCREENED FOR 10-11-19 OV. 

## 2019-10-11 ENCOUNTER — Other Ambulatory Visit: Payer: Self-pay

## 2019-10-11 ENCOUNTER — Ambulatory Visit (INDEPENDENT_AMBULATORY_CARE_PROVIDER_SITE_OTHER): Payer: Medicare Other | Admitting: Nurse Practitioner

## 2019-10-11 ENCOUNTER — Encounter: Payer: Self-pay | Admitting: Nurse Practitioner

## 2019-10-11 VITALS — BP 187/104 | HR 66 | Temp 97.2°F | Ht 65.0 in | Wt 254.6 lb

## 2019-10-11 DIAGNOSIS — I10 Essential (primary) hypertension: Secondary | ICD-10-CM | POA: Diagnosis not present

## 2019-10-11 DIAGNOSIS — B001 Herpesviral vesicular dermatitis: Secondary | ICD-10-CM

## 2019-10-11 DIAGNOSIS — Z0001 Encounter for general adult medical examination with abnormal findings: Secondary | ICD-10-CM

## 2019-10-11 DIAGNOSIS — Z13820 Encounter for screening for osteoporosis: Secondary | ICD-10-CM

## 2019-10-11 DIAGNOSIS — R300 Dysuria: Secondary | ICD-10-CM

## 2019-10-11 DIAGNOSIS — E782 Mixed hyperlipidemia: Secondary | ICD-10-CM | POA: Diagnosis not present

## 2019-10-11 DIAGNOSIS — Z23 Encounter for immunization: Secondary | ICD-10-CM

## 2019-10-11 DIAGNOSIS — H6120 Impacted cerumen, unspecified ear: Secondary | ICD-10-CM | POA: Diagnosis not present

## 2019-10-11 DIAGNOSIS — H9193 Unspecified hearing loss, bilateral: Secondary | ICD-10-CM

## 2019-10-11 DIAGNOSIS — Z1231 Encounter for screening mammogram for malignant neoplasm of breast: Secondary | ICD-10-CM

## 2019-10-11 MED ORDER — DEBROX 6.5 % OT SOLN: 5.0000 [drp] | Freq: Two times a day (BID) | OTIC | 0 refills | Status: DC

## 2019-10-11 MED ORDER — PNEUMOCOCCAL 13-VAL CONJ VACC IM SUSP: 0.5000 mL | Freq: Once | INTRAMUSCULAR | 0 refills | Status: AC

## 2019-10-11 MED ORDER — VALACYCLOVIR HCL 1 G PO TABS: 1000.0000 mg | ORAL_TABLET | Freq: Two times a day (BID) | ORAL | 2 refills | Status: DC

## 2019-10-11 MED ORDER — AMLODIPINE BESYLATE 5 MG PO TABS: 5.0000 mg | ORAL_TABLET | Freq: Two times a day (BID) | ORAL | 2 refills | Status: DC

## 2019-10-11 NOTE — Progress Notes (Signed)
[facility] [place], [place] [address]  Internal MEDICINE  Office Visit Note  Patient Name: Whitney [name]  [id number]  [id number]  Date of Service: 10/15/2019   Pt is here for routine health maintenance examination  Chief Complaint  Patient presents with  . Annual Exam  . Hypertension  . Hyperlipidemia  . Gastroesophageal Reflux  . Blister    fever blister   . Quality Metric Gaps    pna vacc and dexa scan     The patient is here for health maintenance exam. The patient's blood pressure has been moderately elevated recently. She did see her cardiologist, blood pressure was good at hat time. Her home pressure readings have been high, even when blood pressure was good at her cardiology office.  She is due to have routine, fasting labs done. She should also have screening mammogram and bone density test.  She had colonoscopy done 09/19/2019. She had three 4 to 6 mm polyps in the ascending colon, removed with a cold snare. She is to have a repeat done soon as colon preparation was fair to poor. She had upper endoscopy done 06/12/2020. It showed - Normal examined duodenum.  Medium-sized hiatal hernia. Normal stomach. Mucosal nodule found in the esophagus. Difficulty hearing. Both ears. Has been trying to keep them cleaned out with warm water and q tip. States that this has really not helped improve her hearing much.  Fever blister. Started last night. Has taken medication for this in the past. Got now has been applying oragel to help with pain relief,     Current Medication: Outpatient Encounter Medications as of 10/11/2019  Medication Sig  . acetaminophen (TYLENOL) 500 MG tablet Take 1,000 mg by mouth every 6 (six) hours as needed for moderate pain or headache.  Marland Kitchen amLODipine (NORVASC) 5 MG tablet Take 1 tablet (5 mg total) by mouth 2 (two) times daily.  Marland Kitchen aspirin EC 81 MG tablet Take 81 mg by mouth daily.  Marland Kitchen atorvastatin (LIPITOR) 40 MG tablet Take 1 tablet  (40 mg total) by mouth daily at 6 PM.  . carvedilol (COREG) 3.125 MG tablet TAKE 2 TABLETS(6.25 MG) BY MOUTH TWICE DAILY WITH A MEAL  . Cholecalciferol (VITAMIN D) 50 MCG (2000 UT) tablet Take 2,000 Units by mouth daily.  . diclofenac Sodium (VOLTAREN) 1 % GEL Voltaren 1 % topical gel  APPLY 2 GRAM TO THE AFFECTED AREA(S) BY TOPICAL ROUTE 4 TIMES PER DAY  . famotidine (PEPCID) 40 MG tablet Take 1 tablet (40 mg total) by mouth daily. (Patient taking differently: Take 40 mg by mouth daily as needed. )  . flecainide (TAMBOCOR) 100 MG tablet Take 100 mg by mouth 2 (two) times daily.  Marland Kitchen gabapentin (NEURONTIN) 100 MG capsule Take 1 capsule (100 mg total) by mouth 2 (two) times daily. (Patient taking differently: Take 100 mg by mouth 2 (two) times daily as needed (pain). )  . hydrALAZINE (APRESOLINE) 25 MG tablet Take 1 tablet (25 mg total) by mouth 3 (three) times daily.  . meclizine (ANTIVERT) 25 MG tablet Take 1 tablet (25 mg total) by mouth 2 (two) times daily as needed for dizziness.  . nystatin ointment (MYCOSTATIN) Apply 1 application topically 2 (two) times daily as needed (knee pain).  Marland Kitchen rOPINIRole (REQUIP) 0.5 MG tablet Takes 1 or 2 tablets daily as needed (Patient taking differently: Take 0.5 mg by mouth 2 (two) times daily as needed (restless legs). )  . traMADol (ULTRAM) 50 MG tablet  Take 1 tablet (50 mg total) by mouth every 6 (six) hours as needed for moderate pain.  . traZODone (DESYREL) 50 MG tablet Take 1 tablet (50 mg total) by mouth at bedtime as needed for sleep.  . [DISCONTINUED] amLODipine (NORVASC) 5 MG tablet Take 5 mg by mouth daily.  . carbamide peroxide (DEBROX) 6.5 % OTIC solution Place 5 drops into both ears 2 (two) times daily.  Marland Kitchen omeprazole (PRILOSEC) 20 MG capsule Take 1 capsule (20 mg total) by mouth 2 (two) times daily for 14 days.  . [EXPIRED] pneumococcal 13-valent conjugate vaccine (PREVNAR 13) SUSP injection Inject 0.5 mLs into the muscle once for 1 dose.  .  valACYclovir (VALTREX) 1000 MG tablet Take 1 tablet (1,000 mg total) by mouth 2 (two) times daily.  . [DISCONTINUED] predniSONE (DELTASONE) 10 MG tablet Use per dose pack (Patient not taking: Reported on 10/11/2019)   No facility-administered encounter medications on file as of 10/11/2019.    Surgical History: Past Surgical History:  Procedure Laterality Date  . ABDOMINAL HYSTERECTOMY    . CARDIAC CATHETERIZATION    . CHOLECYSTECTOMY    . COLONOSCOPY WITH PROPOFOL N/A 06/13/2019   Procedure: COLONOSCOPY WITH PROPOFOL;  Surgeon: [name], MD;  Location: [facility] ENDOSCOPY;  Service: Gastroenterology;  Laterality: N/A;  . COLONOSCOPY WITH PROPOFOL N/A 08/12/2019   Procedure: COLONOSCOPY WITH PROPOFOL;  Surgeon: [name], MD;  Location: [facility] ENDOSCOPY;  Service: Gastroenterology;  Laterality: N/A;  . COLONOSCOPY WITH PROPOFOL N/A 09/19/2019   Procedure: COLONOSCOPY WITH PROPOFOL;  Surgeon: [name], MD;  Location: [facility] ENDOSCOPY;  Service: Gastroenterology;  Laterality: N/A;  . ESOPHAGOGASTRODUODENOSCOPY (EGD) WITH PROPOFOL N/A 06/13/2019   Procedure: ESOPHAGOGASTRODUODENOSCOPY (EGD) WITH PROPOFOL;  Surgeon: [name], MD;  Location: [facility] ENDOSCOPY;  Service: Gastroenterology;  Laterality: N/A;  Pt will go for COVID test on [id number] as she uses public transportation and will be in the area on that day.   Marland Kitchen HYSTEROSCOPY WITH D & C N/A 02/16/2017   Procedure: DILATATION AND CURETTAGE /HYSTEROSCOPY;  Surgeon: Defrancesco, Alanda Slim, MD;  Location: [facility] ORS;  Service: Gynecology;  Laterality: N/A;  . JOINT REPLACEMENT    . kidney removal Left   . KIDNEY SURGERY Left 1979   left kidney removed  . LEFT HEART CATH AND CORONARY ANGIOGRAPHY N/A 10/21/2017   Procedure: LEFT HEART CATH AND CORONARY ANGIOGRAPHY;  Surgeon: Teodoro Spray, MD;  Location: [facility] CV LAB;  Service: Cardiovascular;  Laterality: N/A;  . TOTAL KNEE ARTHROPLASTY Left 07/06/2017   Procedure: TOTAL KNEE ARTHROPLASTY;  Surgeon:  Lovell Sheehan, MD;  Location: [facility] ORS;  Service: Orthopedics;  Laterality: Left;  . WRIST ARTHROSCOPY Left 1970s    Medical History: Past Medical History:  Diagnosis Date  . Abnormal Pap smear of cervix    Pt states she had colposcopy   . Arthritis   . Chronic kidney disease    removed left kidney  . Dyspnea   . Dysrhythmia   . Hx of unilateral nephrectomy 1979   Left Nephrectomy  . Hypertension   . Lower extremity edema   . Palpitations   . PMB (postmenopausal bleeding)   . Restless leg syndrome   . Sleep apnea    has no cpap  . Vitamin D deficiency     Family History: Family History  Problem Relation Age of Onset  . Ovarian cancer Mother   . Hypertension Father   . Diabetes Father   . Cancer Father   . Breast cancer Sister   .  Diabetes Sister       Review of Systems  Constitutional: Positive for activity change and fatigue. Negative for chills and unexpected weight change.  HENT: Positive for hearing loss and mouth sores. Negative for congestion, postnasal drip, rhinorrhea, sneezing and sore throat.   Respiratory: Negative for cough, chest tightness, shortness of breath and wheezing.   Cardiovascular: Negative for chest pain and palpitations.       Blood pressure moderately elevated today.   Gastrointestinal: Positive for abdominal distention. Negative for abdominal pain, constipation, diarrhea, nausea and vomiting.  Endocrine: Negative for cold intolerance, heat intolerance, polydipsia and polyuria.  Musculoskeletal: Positive for arthralgias and myalgias. Negative for back pain, joint swelling and neck pain.  Skin: Negative for rash.  Allergic/Immunologic: Negative for environmental allergies.  Neurological: Negative for dizziness, tremors, numbness and headaches.  Hematological: Negative for adenopathy. Does not bruise/bleed easily.  Psychiatric/Behavioral: Negative for behavioral problems (Depression), sleep disturbance and suicidal ideas. The patient is  nervous/anxious.      Today's Vitals   10/11/19 1440  BP: (!) 187/104  Pulse: 66  Temp: (!) 97.2 F (36.2 C)  SpO2: 97%  Weight: 254 lb 9.6 oz (115.5 kg)  Height: 5\' 5"  (1.651 m)   Body mass index is 42.37 kg/m.  Physical Exam Vitals and nursing note reviewed.  Constitutional:      General: She is not in acute distress.    Appearance: Normal appearance. She is well-developed. She is obese. She is not diaphoretic.  HENT:     Head: Normocephalic and atraumatic.     Right Ear: There is impacted cerumen.     Ears:     Comments: Moderate wax in the right canal without complete impaction. Lesser amount of wax in the left ear canal    Mouth/Throat:     Pharynx: No oropharyngeal exudate.  Eyes:     Pupils: Pupils are equal, round, and reactive to light.  Neck:     Thyroid: No thyromegaly.     Vascular: No carotid bruit or JVD.     Trachea: No tracheal deviation.  Cardiovascular:     Rate and Rhythm: Normal rate and regular rhythm.     Heart sounds: Murmur present. No friction rub. No gallop.   Pulmonary:     Effort: Pulmonary effort is normal. No respiratory distress.     Breath sounds: Normal breath sounds. No wheezing or rales.  Chest:     Chest wall: No tenderness.  Abdominal:     General: Bowel sounds are normal.     Palpations: Abdomen is soft.     Tenderness: There is abdominal tenderness.  Musculoskeletal:        General: Normal range of motion.     Cervical back: Normal range of motion and neck supple.  Lymphadenopathy:     Cervical: No cervical adenopathy.  Skin:    General: Skin is warm and dry.  Neurological:     Mental Status: She is alert and oriented to person, place, and time. Mental status is at baseline.     Cranial Nerves: No cranial nerve deficit.  Psychiatric:        Mood and Affect: Mood normal.        Behavior: Behavior normal.        Thought Content: Thought content normal.        Judgment: Judgment normal.    Depression screen [facility] 2/9  10/11/2019 05/03/2019 10/25/2018 01/07/2018 12/18/2017  Decreased Interest 0 0 0 0 0  Down,  Depressed, Hopeless 0 0 0 - 0  PHQ - 2 Score 0 0 0 0 0  Some recent data might be hidden    Functional Status Survey: Is the patient deaf or have difficulty hearing?: Yes Does the patient have difficulty seeing, even when wearing glasses/contacts?: No Does the patient have difficulty concentrating, remembering, or making decisions?: No Does the patient have difficulty walking or climbing stairs?: Yes(climbing stairs) Does the patient have difficulty dressing or bathing?: No Does the patient have difficulty doing errands alone such as visiting a doctor's office or shopping?: No  MMSE - [place] Exam 10/11/2019  Orientation to time 5  Orientation to Place 5  Registration 3  Attention/ Calculation 5  Recall 3  Language- name 2 objects 2  Language- repeat 1  Language- follow 3 step command 3  Language- read & follow direction 1  Write a sentence 1  Copy design 1  Total score 30    Fall Risk  10/11/2019 09/21/2019 08/08/2019 06/08/2019 05/24/2019  Falls in the past year? 0 0 0 0 0  Number falls in past yr: - 0 0 - 0  Injury with Fall? - 0 0 - 0      LABS: Recent Results (from the past 2160 hour(s))  SARS CORONAVIRUS 2 (TAT 6-24 HRS) Nasopharyngeal Nasopharyngeal Swab     Status: None   Collection Time: 08/10/19 12:23 PM   Specimen: Nasopharyngeal Swab  Result Value Ref Range   SARS Coronavirus 2 NEGATIVE NEGATIVE    Comment: (NOTE) SARS-CoV-2 target nucleic acids are NOT DETECTED. The SARS-CoV-2 RNA is generally detectable in upper and lower respiratory specimens during the acute phase of infection. Negative results do not preclude SARS-CoV-2 infection, do not rule out co-infections with other pathogens, and should not be used as the sole basis for treatment or other patient management decisions. Negative results must be combined with clinical observations, patient history, and  epidemiological information. The expected result is Negative. Fact Sheet for Patients: SugarRoll.be Fact Sheet for Healthcare Providers: https://www.woods-mathews.com/ This test is not yet approved or cleared by the [place] FDA and  has been authorized for detection and/or diagnosis of SARS-CoV-2 by FDA under an Emergency Use Authorization (EUA). This EUA will remain  in effect (meaning this test can be used) for the duration of the COVID-19 declaration under Section 56 4(b)(1) of the Act, 21 U.S.C. section 360bbb-3(b)(1), unless the authorization is terminated or revoked sooner. Performed at [facility], [place] [address] [place], [place] [address]   Surgical pathology     Status: None   Collection Time: 08/12/19  8:53 AM  Result Value Ref Range   SURGICAL PATHOLOGY      SURGICAL PATHOLOGY CASE: [id number] PATIENT: [facility] Surgical Pathology Report     Specimen Submitted: A. Colon polyp, cecum; cold snare  Clinical History: Colon cancer screening. Colon polyp      DIAGNOSIS: A. COLON POLYP, CECUM; COLD SNARE: - BENIGN INFLAMMATORY POLYP. - NEGATIVE FOR DYSPLASIA AND MALIGNANCY.   GROSS DESCRIPTION: A. Labeled: Cold snare cecal colon polyp Received: In formalin Tissue fragment(s): 1 Size: 0.5 x 0.4 x 0.4 cm Description: Tan soft polypoid lesion Entirely submitted in 1 cassette bisected polyp.    Final Diagnosis performed by Betsy Pries, MD.   Electronically signed 08/15/2019 8:43:00AM The electronic signature indicates that the named Attending Pathologist has evaluated the specimen Technical component performed at [facility], [address] [place], [place] [address] Lab: [phone] Dir: Rush Farmer, MD, MMM  Professional component performed at [facility], [facility], [place], [place], [place] [address] Lab: [phone] Dir: Dellia Nims. Rubinas, MD   SARS CORONAVIRUS 2  (TAT 6-24 HRS) Nasopharyngeal Nasopharyngeal Swab     Status: None   Collection Time: 09/15/19 11:27 AM   Specimen: Nasopharyngeal Swab  Result Value Ref Range   SARS Coronavirus 2 NEGATIVE NEGATIVE    Comment: (NOTE) SARS-CoV-2 target nucleic acids are NOT DETECTED. The SARS-CoV-2 RNA is generally detectable in upper and lower respiratory specimens during the acute phase of infection. Negative results do not preclude SARS-CoV-2 infection, do not rule out co-infections with other pathogens, and should not be used as the sole basis for treatment or other patient management decisions. Negative results must be combined with clinical observations, patient history, and epidemiological information. The expected result is Negative. Fact Sheet for Patients: SugarRoll.be Fact Sheet for Healthcare Providers: https://www.woods-mathews.com/ This test is not yet approved or cleared by the [place] FDA and  has been authorized for detection and/or diagnosis of SARS-CoV-2 by FDA under an Emergency Use Authorization (EUA). This EUA will remain  in effect (meaning this test can be used) for the duration of the COVID-19 declaration under Section 56 4(b)(1) of the Act, 21 U.S.C. section 360bbb-3(b)(1), unless the authorization is terminated or revoked sooner. Performed at [facility], [place] [address] [place], [place] [address]   Urine Drug Screen, Qualitative ([place] only)     Status: Abnormal   Collection Time: 09/19/19  7:16 AM  Result Value Ref Range   Tricyclic, Ur Screen NONE DETECTED NONE DETECTED   Amphetamines, Ur Screen NONE DETECTED NONE DETECTED   MDMA (Ecstasy)Ur Screen NONE DETECTED NONE DETECTED   Cocaine Metabolite,Ur [place] NONE DETECTED NONE DETECTED   Opiate, Ur Screen NONE DETECTED NONE DETECTED   Phencyclidine (PCP) Ur S NONE DETECTED NONE DETECTED   Cannabinoid 50 Ng, Ur [place] POSITIVE (A) NONE DETECTED   Barbiturates, Ur Screen NONE  DETECTED NONE DETECTED   Benzodiazepine, Ur Scrn NONE DETECTED NONE DETECTED   Methadone Scn, Ur NONE DETECTED NONE DETECTED    Comment: (NOTE) Tricyclics + metabolites, urine    Cutoff 1000 ng/mL Amphetamines + metabolites, urine  Cutoff 1000 ng/mL MDMA (Ecstasy), urine              Cutoff 500 ng/mL Cocaine Metabolite, urine          Cutoff 300 ng/mL Opiate + metabolites, urine        Cutoff 300 ng/mL Phencyclidine (PCP), urine         Cutoff 25 ng/mL Cannabinoid, urine                 Cutoff 50 ng/mL Barbiturates + metabolites, urine  Cutoff 200 ng/mL Benzodiazepine, urine              Cutoff 200 ng/mL Methadone, urine                   Cutoff 300 ng/mL The urine drug screen provides only a preliminary, unconfirmed analytical test result and should not be used for non-medical purposes. Clinical consideration and professional judgment should be applied to any positive drug screen result due to possible interfering substances. A more specific alternate chemical method must be used in order to obtain a confirmed analytical result. Gas chromatography / mass spectrometry (GC/MS) is the preferred confirmat ory method. Performed at [facility], [address] [place], [place] [address]   Surgical pathology  Status: None   Collection Time: 09/19/19  8:18 AM  Result Value Ref Range   SURGICAL PATHOLOGY      SURGICAL PATHOLOGY CASE: [id number] PATIENT: [facility] Surgical Pathology Report     Specimen Submitted: A. Colon polyps x3, cecum; cold snare  Clinical History: Dyspepsia R10.13, Unspecified abdominal pain R10.9, polyp of colon K63.5. Colon polyps.    DIAGNOSIS: A. COLON POLYPS X3, CECUM; COLD SNARE: - PREDOMINANTLY FECAL MATERIAL. - MINUTE SUPERFICIAL STRIPS OF BENIGN COLONIC MUCOSA. - NO EVIDENCE OF DYSPLASIA OR MALIGNANCY.  Comment: The specimen container was re-examined, without identification of additional tissue.  Following initial  sections, the paraffin-embedded tissue block was also re-examined, and multiple additional deeper recut levels were performed, without identification of further colonic tissue. Clinical correlation is recommended.  GROSS DESCRIPTION: A. Labeled: Cold snare polyp cecal x3 Received: In formalin Tissue fragment(s): Multiple Size: Aggregate, 1.0 x 0.8 x 0.1 cm Description: Yellow most likely fecal material.  No gros sly viable tissue identified. The specimen is filtrated and entirely submitted in the mesh biopsy bag.    Final Diagnosis performed by Allena Napoleon, MD.   Electronically signed 09/20/2019 10:56:05AM The electronic signature indicates that the named Attending Pathologist has evaluated the specimen Technical component performed at [facility], [address] [place], [place] [address] Lab: [phone] Dir: Rush Farmer, MD, MMM  Professional component performed at [facility], [place], [place], [place] [address] Lab: [phone] Dir: Dellia Nims. Rubinas, MD   UA/M w/rflx Culture, Routine     Status: Abnormal   Collection Time: 10/11/19 12:00 AM   Specimen: Urine   URINE  Result Value Ref Range   Specific Gravity, UA 1.015 1.005 - 1.030   pH, UA 8.0 (H) 5.0 - 7.5   Color, UA Yellow Yellow   Appearance Ur Clear Clear   Leukocytes,UA Negative Negative   Protein,UA 3+ (A) Negative/Trace   Glucose, UA Negative Negative   Ketones, UA Negative Negative   RBC, UA Negative Negative   Bilirubin, UA Negative Negative   Urobilinogen, Ur 0.2 0.2 - 1.0 mg/dL   Nitrite, UA Negative Negative   Microscopic Examination See below:     Comment: Microscopic was indicated and was performed.   Urinalysis Reflex Comment     Comment: This specimen has reflexed to a Urine Culture.  Microscopic Examination     Status: Abnormal   Collection Time: 10/11/19 12:00 AM   URINE  Result Value Ref Range   WBC, UA 6-10 (A) 0 - 5 /hpf   RBC None seen 0 - 2 /hpf    Epithelial Cells (non renal) >10 (A) 0 - 10 /hpf   Casts None seen None seen /lpf   Bacteria, UA Moderate (A) None seen/Few  Urine Culture, Reflex     Status: Abnormal   Collection Time: 10/11/19 12:00 AM   URINE  Result Value Ref Range   Urine Culture, Routine Final report (A)    Organism ID, Bacteria Citrobacter koseri (A)     Comment: Greater than 100,000 colony forming units per mL   ORGANISM ID, BACTERIA Comment     Comment: Mixed urogenital flora 25,000-50,000 colony forming units per mL    Antimicrobial Susceptibility Comment     Comment:       ** S = Susceptible; I = Intermediate; R = Resistant **                    P = Positive; N = Negative  MICS are expressed in micrograms per mL    Antibiotic                 RSLT#1    RSLT#2    RSLT#3    RSLT#4 Amoxicillin/Clavulanic Acid    S Cefepime                       S Ceftriaxone                    S Cefuroxime                     S Ciprofloxacin                  S Ertapenem                      S Gentamicin                     S Imipenem                       S Levofloxacin                   S Meropenem                      S Nitrofurantoin                 S Piperacillin/Tazobactam        S Tetracycline                   S Tobramycin                     S Trimethoprim/Sulfa             S     Assessment/Plan: 1. Encounter for general adult medical examination with abnormal findings Annual health maintenance exam today.   2. Essential hypertension Increase amlodipine 5mg  to twice daily. Continue other mp medication as prescribed. Follow up with cardiology and nephrology as scheduled.  - amLODipine (NORVASC) 5 MG tablet; Take 1 tablet (5 mg total) by mouth 2 (two) times daily.  Dispense: 60 tablet; Refill: 2  3. Mixed hyperlipidemia Check fasting lipid panel and adjust atorvastatin as indicated  4. Fever blister May take valacyclovir 1000mg  twice daily as needed when fever blister begins to develop.  -  valACYclovir (VALTREX) 1000 MG tablet; Take 1 tablet (1,000 mg total) by mouth 2 (two) times daily.  Dispense: 10 tablet; Refill: 2  5. Cerumen in auditory canal on examination Recommend use of debrox along with warm water to rinse out ear canals. Refer to audiology for further evaluation and treatment.  - carbamide peroxide (DEBROX) 6.5 % OTIC solution; Place 5 drops into both ears 2 (two) times daily.  Dispense: 15 mL; Refill: 0  6. Bilateral hearing loss, unspecified hearing loss type Refer to audiology for further evaluation and treatment. - Ambulatory referral to Audiology  7. Need for vaccination against Streptococcus pneumoniae using pneumococcal conjugate vaccine 13 Prescription for Prevnar 13 sent to pharmacy for administration.  - pneumococcal 13-valent conjugate vaccine (PREVNAR 13) SUSP injection; Inject 0.5 mLs into the muscle once for 1 dose.  Dispense: 0.5 mL; Refill: 0  8. Encounter for screening mammogram for malignant neoplasm of breast - MM DIGITAL SCREENING BILATERAL; Future  9. Screening for osteoporosis -  DG Bone Density; Future  10. Dysuria - UA/M w/rflx Culture, Routine  General Counseling: Whitney [name] verbalizes understanding of the findings of todays visit and agrees with plan of treatment. I have discussed any further diagnostic evaluation that may be needed or ordered today. We also reviewed her medications today. she has been encouraged to call the office with any questions or concerns that should arise related to todays visit.    Counseling:  Hypertension Counseling:   The following hypertensive lifestyle modification were recommended and discussed:  1. Limiting alcohol intake to less than 1 oz/day of ethanol:(24 oz of beer or 8 oz of wine or 2 oz of 100-proof whiskey). 2. Take baby ASA 81 mg daily. 3. Importance of regular aerobic exercise and losing weight. 4. Reduce dietary saturated fat and cholesterol intake for overall cardiovascular health. 5.  Maintaining adequate dietary potassium, calcium, and magnesium intake. 6. Regular monitoring of the blood pressure. 7. Reduce sodium intake to less than 100 mmol/day (less than 2.3 gm of sodium or less than 6 gm of sodium choride)   This patient was seen by [facility] with Dr Lavera Guise as a part of collaborative care agreement  Orders Placed This Encounter  Procedures  . Microscopic Examination  . Urine Culture, Reflex  . DG Bone Density  . MM DIGITAL SCREENING BILATERAL  . UA/M w/rflx Culture, Routine  . Ambulatory referral to Audiology    Meds ordered this encounter  Medications  . pneumococcal 13-valent conjugate vaccine (PREVNAR 13) SUSP injection    Sig: Inject 0.5 mLs into the muscle once for 1 dose.    Dispense:  0.5 mL    Refill:  0    Order Specific Question:   Supervising Provider    Answer:   Lavera Guise [id number]  . carbamide peroxide (DEBROX) 6.5 % OTIC solution    Sig: Place 5 drops into both ears 2 (two) times daily.    Dispense:  15 mL    Refill:  0    Order Specific Question:   Supervising Provider    Answer:   Lavera Guise [id number]  . amLODipine (NORVASC) 5 MG tablet    Sig: Take 1 tablet (5 mg total) by mouth 2 (two) times daily.    Dispense:  60 tablet    Refill:  2    Please note increased to BID dosing.    Order Specific Question:   Supervising Provider    Answer:   Lavera Guise [id number]  . valACYclovir (VALTREX) 1000 MG tablet    Sig: Take 1 tablet (1,000 mg total) by mouth 2 (two) times daily.    Dispense:  10 tablet    Refill:  2    Order Specific Question:   Supervising Provider    Answer:   Lavera Guise [id number]    Total time spent: 27 Minutes  Time spent includes review of chart, medications, test results, and follow up plan with the patient.     Lavera Guise, MD  Internal Medicine

## 2019-10-12 NOTE — Progress Notes (Signed)
Consider this to be contamination, but will treat depending on culture and sensitivity results.

## 2019-10-14 LAB — UA/M W/RFLX CULTURE, ROUTINE
Bilirubin, UA: NEGATIVE
Glucose, UA: NEGATIVE
Ketones, UA: NEGATIVE
Leukocytes,UA: NEGATIVE
Nitrite, UA: NEGATIVE
RBC, UA: NEGATIVE
Specific Gravity, UA: 1.015 (ref 1.005–1.030)
Urobilinogen, Ur: 0.2 mg/dL (ref 0.2–1.0)
pH, UA: 8 — ABNORMAL HIGH (ref 5.0–7.5)

## 2019-10-14 LAB — URINE CULTURE, REFLEX

## 2019-10-14 LAB — MICROSCOPIC EXAMINATION
Casts: NONE SEEN /lpf
Epithelial Cells (non renal): >10 /hpf — AB (ref 0–10)
RBC, Urine: NONE SEEN /hpf (ref 0–2)

## 2019-10-15 DIAGNOSIS — Z13820 Encounter for screening for osteoporosis: Secondary | ICD-10-CM | POA: Insufficient documentation

## 2019-10-15 DIAGNOSIS — H9193 Unspecified hearing loss, bilateral: Secondary | ICD-10-CM | POA: Insufficient documentation

## 2019-10-15 DIAGNOSIS — Z23 Encounter for immunization: Secondary | ICD-10-CM | POA: Insufficient documentation

## 2019-10-15 DIAGNOSIS — Z1211 Encounter for screening for malignant neoplasm of colon: Secondary | ICD-10-CM | POA: Insufficient documentation

## 2019-10-15 DIAGNOSIS — H6120 Impacted cerumen, unspecified ear: Secondary | ICD-10-CM | POA: Insufficient documentation

## 2019-10-15 DIAGNOSIS — Z1231 Encounter for screening mammogram for malignant neoplasm of breast: Secondary | ICD-10-CM | POA: Insufficient documentation

## 2019-10-15 DIAGNOSIS — B001 Herpesviral vesicular dermatitis: Secondary | ICD-10-CM | POA: Insufficient documentation

## 2019-10-17 ENCOUNTER — Telehealth: Payer: Self-pay

## 2019-10-17 ENCOUNTER — Other Ambulatory Visit: Payer: Self-pay | Admitting: Nurse Practitioner

## 2019-10-17 DIAGNOSIS — N390 Urinary tract infection, site not specified: Secondary | ICD-10-CM

## 2019-10-17 MED ORDER — AMOXICILLIN-POT CLAVULANATE 875-125 MG PO TABS: 1.0000 | ORAL_TABLET | Freq: Two times a day (BID) | ORAL | 0 refills | Status: DC

## 2019-10-17 NOTE — Progress Notes (Signed)
Patient with UTI at time of visit. Start augmentin 875mg  bid for 10 days. Sent to her pharmacy.

## 2019-10-17 NOTE — Progress Notes (Signed)
Please let the patient know that she had UTI at time of visit. Start augmentin 875mg  bid for 10 days. Sent to her pharmacy. Thanks .

## 2019-10-17 NOTE — Telephone Encounter (Signed)
Pt was notified.  

## 2019-10-17 NOTE — Telephone Encounter (Signed)
-----   Message from Ronnell Freshwater, NP sent at 10/17/2019  8:33 AM EDT ----- Please let the patient know that she had UTI at time of visit. Start augmentin 875mg  bid for 10 days. Sent to her pharmacy. Thanks .

## 2019-10-20 DIAGNOSIS — N1832 Chronic kidney disease, stage 3b: Secondary | ICD-10-CM | POA: Diagnosis not present

## 2019-10-20 DIAGNOSIS — I10 Essential (primary) hypertension: Secondary | ICD-10-CM | POA: Diagnosis not present

## 2019-10-20 DIAGNOSIS — N2581 Secondary hyperparathyroidism of renal origin: Secondary | ICD-10-CM | POA: Diagnosis not present

## 2019-10-20 DIAGNOSIS — R809 Proteinuria, unspecified: Secondary | ICD-10-CM | POA: Diagnosis not present

## 2019-10-25 ENCOUNTER — Ambulatory Visit (INDEPENDENT_AMBULATORY_CARE_PROVIDER_SITE_OTHER): Payer: Medicare Other | Admitting: Obstetrics and Gynecology

## 2019-10-25 ENCOUNTER — Other Ambulatory Visit: Payer: Self-pay | Admitting: Otolaryngology

## 2019-10-25 ENCOUNTER — Other Ambulatory Visit: Payer: Self-pay

## 2019-10-25 ENCOUNTER — Encounter: Payer: Self-pay | Admitting: Obstetrics and Gynecology

## 2019-10-25 VITALS — BP 127/75 | HR 71 | Ht 65.0 in | Wt 254.9 lb

## 2019-10-25 DIAGNOSIS — H9313 Tinnitus, bilateral: Secondary | ICD-10-CM | POA: Diagnosis not present

## 2019-10-25 DIAGNOSIS — R221 Localized swelling, mass and lump, neck: Secondary | ICD-10-CM | POA: Diagnosis not present

## 2019-10-25 DIAGNOSIS — Z01419 Encounter for gynecological examination (general) (routine) without abnormal findings: Secondary | ICD-10-CM

## 2019-10-25 DIAGNOSIS — H93293 Other abnormal auditory perceptions, bilateral: Secondary | ICD-10-CM | POA: Diagnosis not present

## 2019-10-25 NOTE — Progress Notes (Signed)
HPI:      [name] is a 69 y.o. G2P1011 who LMP was Patient's last menstrual period was 01/26/2017.  Subjective:   She presents today for her annual examination.  She states that she has generally been well.  Reports that she got a yeast infection after taking antibiotics but this was resolved with Diflucan. She continues to be sexually active without issue. She does complain of some night sweats and some hot flashes which have become more common lately.    Hx: The following portions of the patient's history were reviewed and updated as appropriate:             She  has a past medical history of Abnormal Pap smear of cervix, Arthritis, Chronic kidney disease, Dyspnea, Dysrhythmia, unilateral nephrectomy (1979), Hypertension, Lower extremity edema, Palpitations, PMB (postmenopausal bleeding), Restless leg syndrome, Sleep apnea, and Vitamin D deficiency. She does not have any pertinent problems on file. She  has a past surgical history that includes Kidney surgery (Left, 1979); Wrist arthroscopy (Left, 1970s); Hysteroscopy with D & C (N/A, 02/16/2017); LEFT HEART CATH AND CORONARY ANGIOGRAPHY (N/A, 10/21/2017); Total knee arthroplasty (Left, 07/06/2017); kidney removal (Left); Joint replacement; Cholecystectomy; Colonoscopy with propofol (N/A, 06/13/2019); Esophagogastroduodenoscopy (egd) with propofol (N/A, 06/13/2019); Abdominal hysterectomy; Cardiac catheterization; Colonoscopy with propofol (N/A, 08/12/2019); and Colonoscopy with propofol (N/A, 09/19/2019). Her family history includes Breast cancer in her sister; Cancer in her father; Diabetes in her father and sister; Hypertension in her father; Ovarian cancer in her mother. She  reports that she has never smoked. She has never used smokeless tobacco. She reports current alcohol use of about 2.0 standard drinks of alcohol per week. She reports current drug use. Frequency: 3.00 times per week. Drugs: Marijuana and Cocaine. She has a current  medication list which includes the following prescription(s): acetaminophen, amlodipine, amoxicillin-clavulanate, aspirin ec, atorvastatin, debrox, carvedilol, vitamin d, diclofenac sodium, famotidine, flecainide, gabapentin, hydralazine, meclizine, nystatin ointment, ropinirole, tramadol, valacyclovir, omeprazole, and trazodone. She is allergic to enalapril and lisinopril.       Review of Systems:  Review of Systems  Constitutional: Denied constitutional symptoms, night sweats, recent illness, fatigue, fever, insomnia and weight loss.  Eyes: Denied eye symptoms, eye pain, photophobia, vision change and visual disturbance.  Ears/Nose/Throat/Neck: Denied ear, nose, throat or neck symptoms, hearing loss, nasal discharge, sinus congestion and sore throat.  Cardiovascular: Denied cardiovascular symptoms, arrhythmia, chest pain/pressure, edema, exercise intolerance, orthopnea and palpitations.  Respiratory: Denied pulmonary symptoms, asthma, pleuritic pain, productive sputum, cough, dyspnea and wheezing.  Gastrointestinal: Denied, gastro-esophageal reflux, melena, nausea and vomiting.  Genitourinary: Denied genitourinary symptoms including symptomatic vaginal discharge, pelvic relaxation issues, and urinary complaints.  Musculoskeletal: Denied musculoskeletal symptoms, stiffness, swelling, muscle weakness and myalgia.  Dermatologic: Denied dermatology symptoms, rash and scar.  Neurologic: Denied neurology symptoms, dizziness, headache, neck pain and syncope.  Psychiatric: Denied psychiatric symptoms, anxiety and depression.  Endocrine: See HPI for additional information.   Meds:   Current Outpatient Medications on File Prior to Visit  Medication Sig Dispense Refill  . acetaminophen (TYLENOL) 500 MG tablet Take 1,000 mg by mouth every 6 (six) hours as needed for moderate pain or headache.    Marland Kitchen amLODipine (NORVASC) 5 MG tablet Take 1 tablet (5 mg total) by mouth 2 (two) times daily. 60 tablet 2  .  amoxicillin-clavulanate (AUGMENTIN) 875-125 MG tablet Take 1 tablet by mouth 2 (two) times daily. 20 tablet 0  . aspirin EC 81 MG tablet Take 81 mg by mouth daily.    Marland Kitchen  atorvastatin (LIPITOR) 40 MG tablet Take 1 tablet (40 mg total) by mouth daily at 6 PM. 90 tablet 1  . carbamide peroxide (DEBROX) 6.5 % OTIC solution Place 5 drops into both ears 2 (two) times daily. 15 mL 0  . carvedilol (COREG) 3.125 MG tablet TAKE 2 TABLETS(6.25 MG) BY MOUTH TWICE DAILY WITH A MEAL 120 tablet 3  . Cholecalciferol (VITAMIN D) 50 MCG (2000 UT) tablet Take 2,000 Units by mouth daily.    . diclofenac Sodium (VOLTAREN) 1 % GEL Voltaren 1 % topical gel  APPLY 2 GRAM TO THE AFFECTED AREA(S) BY TOPICAL ROUTE 4 TIMES PER DAY    . famotidine (PEPCID) 40 MG tablet Take 1 tablet (40 mg total) by mouth daily. (Patient taking differently: Take 40 mg by mouth daily as needed. ) 90 tablet 1  . flecainide (TAMBOCOR) 100 MG tablet Take 100 mg by mouth 2 (two) times daily.    Marland Kitchen gabapentin (NEURONTIN) 100 MG capsule Take 1 capsule (100 mg total) by mouth 2 (two) times daily. (Patient taking differently: Take 100 mg by mouth 2 (two) times daily as needed (pain). ) 90 capsule 0  . hydrALAZINE (APRESOLINE) 25 MG tablet Take 1 tablet (25 mg total) by mouth 3 (three) times daily. 90 tablet 3  . meclizine (ANTIVERT) 25 MG tablet Take 1 tablet (25 mg total) by mouth 2 (two) times daily as needed for dizziness. 20 tablet 0  . nystatin ointment (MYCOSTATIN) Apply 1 application topically 2 (two) times daily as needed (knee pain).    Marland Kitchen rOPINIRole (REQUIP) 0.5 MG tablet Takes 1 or 2 tablets daily as needed (Patient taking differently: Take 0.5 mg by mouth 2 (two) times daily as needed (restless legs). ) 90 tablet 1  . traMADol (ULTRAM) 50 MG tablet Take 1 tablet (50 mg total) by mouth every 6 (six) hours as needed for moderate pain. 15 tablet 0  . valACYclovir (VALTREX) 1000 MG tablet Take 1 tablet (1,000 mg total) by mouth 2 (two) times daily.  10 tablet 2  . omeprazole (PRILOSEC) 20 MG capsule Take 1 capsule (20 mg total) by mouth 2 (two) times daily for 14 days. 28 capsule 0  . traZODone (DESYREL) 50 MG tablet Take 1 tablet (50 mg total) by mouth at bedtime as needed for sleep. (Patient not taking: Reported on 10/25/2019) 30 tablet 3   No current facility-administered medications on file prior to visit.    Objective:     Vitals:   10/25/19 0929  BP: 127/75  Pulse: 71              Physical examination General NAD, Conversant  HEENT Atraumatic; Op clear with mmm.  Normo-cephalic. Pupils reactive. Anicteric sclerae  Thyroid/Neck Smooth without nodularity or enlargement. Normal ROM.  Neck Supple.  Skin No rashes, lesions or ulceration. Normal palpated skin turgor. No nodularity.  Breasts: No masses or discharge.  Symmetric.  No axillary adenopathy.  Lungs: Clear to auscultation.No rales or wheezes. Normal Respiratory effort, no retractions.  Heart: NSR.  No murmurs or rubs appreciated. No periferal edema  Abdomen: Soft.  Non-tender.  No masses.  No HSM. No hernia  Extremities: Moves all appropriately.  Normal ROM for age. No lymphadenopathy.  Neuro: Oriented to PPT.  Normal mood. Normal affect.     Pelvic:   Vulva: Normal appearance.  No lesions.  Vagina: No lesions or abnormalities noted.  Support: Normal pelvic support.  Urethra No masses tenderness or scarring.  Meatus Normal size without  lesions or prolapse.  Cervix: Normal appearance.  No lesions.  Anus: Normal exam.  No lesions.  Perineum: Normal exam.  No lesions.        Bimanual   Uterus: Normal size.  Non-tender.  Mobile.  AV.  Adnexae: No masses.  Non-tender to palpation.  Cul-de-sac: Negative for abnormality.   Exam limited by patient body habitus   Assessment:    G2P1011 Patient Active Problem List   Diagnosis Date Noted  . Fever blister 10/15/2019  . Cerumen in auditory canal on examination 10/15/2019  . Bilateral hearing loss 10/15/2019  .  Need for vaccination against Streptococcus pneumoniae using pneumococcal conjugate vaccine 13 10/15/2019  . Encounter for screening mammogram for malignant neoplasm of breast 10/15/2019  . Screening for osteoporosis 10/15/2019  . Elevated erythrocyte sedimentation rate 11/02/2018  . Lumbar spondylosis 11/02/2018  . PVC's (premature ventricular contractions) 10/18/2018  . Primary osteoarthritis of right knee 09/15/2018  . Encounter for pre-operative examination 09/15/2018  . Obstructive sleep apnea 09/15/2018  . Calculus of gallbladder without cholecystitis without obstruction 09/15/2018  . Calculus of gallbladder with cholecystitis without biliary obstruction 01/27/2018  . Restless leg syndrome 01/27/2018  . Generalized abdominal pain 01/11/2018  . Cyst of pancreas 01/11/2018  . Other specified disorders of kidney and ureter 01/11/2018  . Primary insomnia 01/11/2018  . Paroxysmal atrial fibrillation ([place]) 11/12/2017  . Essential hypertension 10/25/2017  . Mixed hyperlipidemia 10/25/2017  . Low back pain at multiple sites 10/25/2017  . Dysuria 10/25/2017  . Encounter for general adult medical examination with abnormal findings 10/25/2017  . Vitamin D deficiency 10/25/2017  . Near syncope   . Symptomatic bradycardia 10/19/2017  . Carpal tunnel syndrome 10/12/2017  . Primary osteoarthritis of left knee 07/09/2017  . History of total knee arthroplasty 07/06/2017  . Chest pain 06/29/2017  . Postop check 02/25/2017  . Impingement syndrome of shoulder region 01/20/2017  . Neck pain 01/20/2017  . Obesity (BMI 35.0-39.9 without comorbidity) 07/15/2016  . Endometrial polyp 07/15/2016  . Morbid obesity ([place]) 07/15/2016  . Family history of breast cancer in first degree relative 07/15/2016  . Family history of ovarian cancer 07/15/2016  . Knee pain 07/04/2016  . Bilateral leg pain 06/24/2016     1. Well woman exam with routine gynecological exam     Occasional night sweats and hot  flashes but otherwise no GYN issues   Plan:            1.  Basic Screening Recommendations The basic screening recommendations for asymptomatic women were discussed with the patient during her visit.  The age-appropriate recommendations were discussed with her and the rational for the tests reviewed.  When I am informed by the patient that another primary care physician has previously obtained the age-appropriate tests and they are up-to-date, only outstanding tests are ordered and referrals given as necessary.  Abnormal results of tests will be discussed with her when all of her results are completed.  Routine preventative health maintenance measures emphasized: Exercise/Diet/Weight control, Tobacco Warnings, Alcohol/Substance use risks and Stress Management Patient is up-to-date on Paps-mammogram due in April. 2.  We have discussed hot flashes and night sweats.  Discussed the use of black cohosh and she will let us know how she is doing with this trial.  Orders No orders of the defined types were placed in this encounter.   No orders of the defined types were placed in this encounter.       F/U  No follow-ups on file.  Finis Bud, M.D. 10/25/2019 10:10 AM

## 2019-10-27 ENCOUNTER — Other Ambulatory Visit (HOSPITAL_COMMUNITY)
Admission: RE | Admit: 2019-10-27 | Discharge: 2019-10-27 | Disposition: A | Discharge status: Home / Self Care | Payer: Medicare Other | Source: Ambulatory Visit | Attending: Gastroenterology | Admitting: Gastroenterology

## 2019-10-27 DIAGNOSIS — Z20822 Contact with and (suspected) exposure to covid-19: Secondary | ICD-10-CM | POA: Insufficient documentation

## 2019-10-27 DIAGNOSIS — Z01812 Encounter for preprocedural laboratory examination: Secondary | ICD-10-CM | POA: Insufficient documentation

## 2019-10-27 LAB — SARS CORONAVIRUS 2 (TAT 6-24 HRS): SARS Coronavirus 2: NEGATIVE

## 2019-10-28 ENCOUNTER — Other Ambulatory Visit: Payer: Self-pay | Admitting: Nurse Practitioner

## 2019-10-28 DIAGNOSIS — N6489 Other specified disorders of breast: Secondary | ICD-10-CM

## 2019-10-28 DIAGNOSIS — Z1231 Encounter for screening mammogram for malignant neoplasm of breast: Secondary | ICD-10-CM

## 2019-10-28 NOTE — Progress Notes (Signed)
Unable to reach pt for pre-op call. Left detailed instructions on pt's voicemail.  covid test done on 10/27/19 and it is negative.  EKG 01/03/19 Epic Echo 06/30/17 Epic Cath 10/21/17 Epic

## 2019-10-30 NOTE — Anesthesia Preprocedure Evaluation (Addendum)
Anesthesia Evaluation  Patient identified by MRN, date of birth, ID band Patient awake    Reviewed: Allergy & Precautions, NPO status , Patient's Chart, lab work & pertinent test results  Airway Mallampati: II  TM Distance: >3 FB Neck ROM: Full    Dental no notable dental hx. (+) Edentulous Upper   Pulmonary shortness of breath, sleep apnea ,    Pulmonary exam normal breath sounds clear to auscultation       Cardiovascular hypertension, Pt. on medications and Pt. on home beta blockers Normal cardiovascular exam Rhythm:Regular Rate:Normal     Neuro/Psych  Neuromuscular disease negative psych ROS   GI/Hepatic negative GI ROS, Neg liver ROS,   Endo/Other  negative endocrine ROS  Renal/GU Renal disease     Musculoskeletal  (+) Arthritis ,   Abdominal (+) + obese,   Peds  Hematology   Anesthesia Other Findings   Reproductive/Obstetrics                           Anesthesia Physical Anesthesia Plan  ASA: III  Anesthesia Plan: MAC   Post-op Pain Management:    Induction: Intravenous  PONV Risk Score and Plan: Treatment may vary due to age or medical condition  Airway Management Planned: Nasal Cannula and Natural Airway  Additional Equipment: None  Intra-op Plan:   Post-operative Plan:   Informed Consent: I have reviewed the patients History and Physical, chart, labs and discussed the procedure including the risks, benefits and alternatives for the proposed anesthesia with the patient or authorized representative who has indicated his/her understanding and acceptance.     Dental advisory given  Plan Discussed with:   Anesthesia Plan Comments:        Anesthesia Quick Evaluation

## 2019-10-31 ENCOUNTER — Other Ambulatory Visit: Payer: Self-pay

## 2019-10-31 ENCOUNTER — Encounter (HOSPITAL_COMMUNITY)
Admission: RE | Disposition: A | Discharge status: Home / Self Care | Payer: Self-pay | Source: Home / Self Care | Attending: Gastroenterology

## 2019-10-31 ENCOUNTER — Other Ambulatory Visit: Payer: Self-pay | Admitting: Physician Assistant

## 2019-10-31 ENCOUNTER — Ambulatory Visit (HOSPITAL_COMMUNITY)
Admission: RE | Admit: 2019-10-31 | Discharge: 2019-10-31 | Disposition: A | Discharge status: Home / Self Care | Payer: Medicare Other | Attending: Gastroenterology | Admitting: Gastroenterology

## 2019-10-31 ENCOUNTER — Ambulatory Visit (HOSPITAL_COMMUNITY): Payer: Medicare Other | Admitting: Anesthesiology

## 2019-10-31 ENCOUNTER — Encounter (HOSPITAL_COMMUNITY): Payer: Self-pay | Admitting: Gastroenterology

## 2019-10-31 DIAGNOSIS — Z905 Acquired absence of kidney: Secondary | ICD-10-CM | POA: Insufficient documentation

## 2019-10-31 DIAGNOSIS — R0602 Shortness of breath: Secondary | ICD-10-CM | POA: Insufficient documentation

## 2019-10-31 DIAGNOSIS — E559 Vitamin D deficiency, unspecified: Secondary | ICD-10-CM | POA: Diagnosis not present

## 2019-10-31 DIAGNOSIS — I129 Hypertensive chronic kidney disease with stage 1 through stage 4 chronic kidney disease, or unspecified chronic kidney disease: Secondary | ICD-10-CM | POA: Diagnosis not present

## 2019-10-31 DIAGNOSIS — Z8041 Family history of malignant neoplasm of ovary: Secondary | ICD-10-CM | POA: Insufficient documentation

## 2019-10-31 DIAGNOSIS — K862 Cyst of pancreas: Secondary | ICD-10-CM | POA: Diagnosis not present

## 2019-10-31 DIAGNOSIS — K208 Other esophagitis without bleeding: Secondary | ICD-10-CM | POA: Diagnosis not present

## 2019-10-31 DIAGNOSIS — Z6841 Body Mass Index (BMI) 40.0 and over, adult: Secondary | ICD-10-CM | POA: Diagnosis not present

## 2019-10-31 DIAGNOSIS — N189 Chronic kidney disease, unspecified: Secondary | ICD-10-CM | POA: Insufficient documentation

## 2019-10-31 DIAGNOSIS — Z9049 Acquired absence of other specified parts of digestive tract: Secondary | ICD-10-CM | POA: Diagnosis not present

## 2019-10-31 DIAGNOSIS — Z8249 Family history of ischemic heart disease and other diseases of the circulatory system: Secondary | ICD-10-CM | POA: Insufficient documentation

## 2019-10-31 DIAGNOSIS — Z90710 Acquired absence of both cervix and uterus: Secondary | ICD-10-CM | POA: Diagnosis not present

## 2019-10-31 DIAGNOSIS — G2581 Restless legs syndrome: Secondary | ICD-10-CM | POA: Diagnosis not present

## 2019-10-31 DIAGNOSIS — M1990 Unspecified osteoarthritis, unspecified site: Secondary | ICD-10-CM | POA: Diagnosis not present

## 2019-10-31 DIAGNOSIS — Z96652 Presence of left artificial knee joint: Secondary | ICD-10-CM | POA: Diagnosis not present

## 2019-10-31 DIAGNOSIS — Z803 Family history of malignant neoplasm of breast: Secondary | ICD-10-CM | POA: Insufficient documentation

## 2019-10-31 DIAGNOSIS — G709 Myoneural disorder, unspecified: Secondary | ICD-10-CM | POA: Insufficient documentation

## 2019-10-31 DIAGNOSIS — G4730 Sleep apnea, unspecified: Secondary | ICD-10-CM | POA: Insufficient documentation

## 2019-10-31 DIAGNOSIS — R002 Palpitations: Secondary | ICD-10-CM | POA: Insufficient documentation

## 2019-10-31 DIAGNOSIS — Z833 Family history of diabetes mellitus: Secondary | ICD-10-CM | POA: Insufficient documentation

## 2019-10-31 DIAGNOSIS — K2950 Unspecified chronic gastritis without bleeding: Secondary | ICD-10-CM | POA: Insufficient documentation

## 2019-10-31 DIAGNOSIS — K449 Diaphragmatic hernia without obstruction or gangrene: Secondary | ICD-10-CM | POA: Insufficient documentation

## 2019-10-31 DIAGNOSIS — K3189 Other diseases of stomach and duodenum: Secondary | ICD-10-CM | POA: Diagnosis not present

## 2019-10-31 DIAGNOSIS — K228 Other specified diseases of esophagus: Secondary | ICD-10-CM | POA: Diagnosis not present

## 2019-10-31 DIAGNOSIS — Z888 Allergy status to other drugs, medicaments and biological substances status: Secondary | ICD-10-CM | POA: Insufficient documentation

## 2019-10-31 DIAGNOSIS — E669 Obesity, unspecified: Secondary | ICD-10-CM | POA: Diagnosis not present

## 2019-10-31 DIAGNOSIS — I899 Noninfective disorder of lymphatic vessels and lymph nodes, unspecified: Secondary | ICD-10-CM | POA: Diagnosis not present

## 2019-10-31 HISTORY — PX: POLYPECTOMY: SHX5525

## 2019-10-31 HISTORY — PX: BIOPSY: SHX5522

## 2019-10-31 HISTORY — PX: HEMOSTASIS CLIP PLACEMENT: SHX6857

## 2019-10-31 HISTORY — PX: ESOPHAGOGASTRODUODENOSCOPY (EGD) WITH PROPOFOL: SHX5813

## 2019-10-31 HISTORY — PX: UPPER ESOPHAGEAL ENDOSCOPIC ULTRASOUND (EUS): SHX6562

## 2019-10-31 SURGERY — UPPER ESOPHAGEAL ENDOSCOPIC ULTRASOUND (EUS)
Anesthesia: Monitor Anesthesia Care

## 2019-10-31 MED ORDER — LIDOCAINE 2% (20 MG/ML) 5 ML SYRINGE
INTRAMUSCULAR | Status: DC | PRN
  Administered 2019-10-31: 60 mg via INTRAVENOUS

## 2019-10-31 MED ORDER — SODIUM CHLORIDE 0.9 % IV SOLN: INTRAVENOUS | Status: DC

## 2019-10-31 MED ORDER — OMEPRAZOLE 40 MG PO CPDR: 40.0000 mg | DELAYED_RELEASE_CAPSULE | Freq: Two times a day (BID) | ORAL | 3 refills | Status: DC

## 2019-10-31 MED ORDER — LACTATED RINGERS IV SOLN: INTRAVENOUS | Status: DC | PRN

## 2019-10-31 MED ORDER — PROPOFOL 500 MG/50ML IV EMUL
INTRAVENOUS | Status: DC | PRN
  Administered 2019-10-31: 150 ug/kg/min via INTRAVENOUS

## 2019-10-31 SURGICAL SUPPLY — 14 items

## 2019-10-31 NOTE — Anesthesia Postprocedure Evaluation (Signed)
Anesthesia Post Note  Patient: Whitney [name]  Procedure(s) Performed: UPPER ESOPHAGEAL ENDOSCOPIC ULTRASOUND (EUS) (N/A ) ESOPHAGOGASTRODUODENOSCOPY (EGD) WITH PROPOFOL (N/A ) POLYPECTOMY BIOPSY     Patient location during evaluation: Endoscopy Anesthesia Type: MAC Level of consciousness: awake and alert Pain management: pain level controlled Vital Signs Assessment: post-procedure vital signs reviewed and stable Respiratory status: spontaneous breathing, nonlabored ventilation, respiratory function stable and patient connected to nasal cannula oxygen Cardiovascular status: blood pressure returned to baseline and stable Postop Assessment: no apparent nausea or vomiting Anesthetic complications: no    Last Vitals:  Vitals:   10/31/19 1150 10/31/19 1548  BP: (!) 169/81 128/60  Pulse: 84 89  Resp: (!) 21 (!) 27  Temp: 36.5 C   SpO2: 93% (!) 89%    Last Pain:  Vitals:   10/31/19 1548  TempSrc:   PainSc: [name]

## 2019-10-31 NOTE — Op Note (Signed)
[facility] Patient Name: Whitney [name] Procedure Date : 10/31/2019 MRN: [id number] Attending MD: Justice Britain , MD Date of Birth: 08/12/1950 CSN: [id number] Age: 69 Admit Type: Inpatient Procedure:                Upper EUS Indications:              Esophageal deformity on endoscopy/Subepithelial                            tumor vs. extrinsic compression, Pancreatic cyst on                            MRI Providers:                Justice Britain, MD, Jeanella Cara, RN,                            Lazaro Arms, Technician, Dellie Catholic, CRNA Referring MD:             [name] MD, MD Medicines:                Monitored Anesthesia Care Complications:            No immediate complications. Estimated Blood Loss:     Estimated blood loss was minimal. Procedure:                Pre-Anesthesia Assessment:                           - Prior to the procedure, a History and Physical                            was performed, and patient medications and                            allergies were reviewed. The patient's tolerance of                            previous anesthesia was also reviewed. The risks                            and benefits of the procedure and the sedation                            options and risks were discussed with the patient.                            All questions were answered, and informed consent                            was obtained. Prior Anticoagulants: The patient has                            taken no previous anticoagulant or antiplatelet  agents except for NSAID medication. ASA Grade                            Assessment: II - A patient with mild systemic                            disease. After reviewing the risks and benefits,                            the patient was deemed in satisfactory condition to                            undergo the procedure.                           After obtaining  informed consent, the endoscope was                            passed under direct vision. Throughout the                            procedure, the patient's blood pressure, pulse, and                            oxygen saturations were monitored continuously. The                            [id number]-[id number] ([id number]) Olympus therapeutic                            gastroscope was introduced through the mouth, and                            advanced to the second part of duodenum. The                            [id number] ([id number]) Waldron was                            introduced through the mouth, and advanced to the                            second part of duodenum. The GF-[id number]-AL5 ([id number])                            Olympus Radial EUS scope was introduced through the                            mouth, and advanced to the stomach for ultrasound                            examination from the esophagus and stomach. The  [id number] ([id number]) Olympus Linear EUS scope was                            introduced through the mouth, and advanced to the                            duodenum for ultrasound examination from the                            stomach and duodenum. The upper EUS was                            accomplished without difficulty. The patient                            tolerated the procedure. Scope In: Scope Out: Findings:      ENDOSCOPIC FINDING: :      No gross lesions were noted in the entire esophagus.      A single 10 mm mucosal papule (nodule) with no bleeding and no stigmata       of recent bleeding was found just below the gastroesophageal junction.       The polyp was removed with a lift and cut technique using a cold snare.       Resection and retrieval were complete. To prevent bleeding       post-maneuver, three hemostatic clips were successfully placed (MR       conditional). There was no bleeding at the end of the procedure.       Patchy nodular mucosa was found in the gastric body and in the gastric       antrum.      A 3 cm hiatal hernia was found. The proximal extent of the gastric folds       (end of tubular esophagus) was 35 cm from the incisors. The hiatal       narrowing was 38 cm from the incisors. The Z-line was 35 cm from the       incisors.      No gross lesions were noted in the entire examined stomach. Biopsies       were taken with a cold forceps for histology and Helicobacter pylori       testing.      No gross lesions were noted in the duodenal bulb, in the first portion       of the duodenum and in the second portion of the duodenum.      The ampulla was normal.      ENDOSONOGRAPHIC FINDING: :      Localized wall thickening was visualized endosonographically just below       the gastroesophageal junction. This was encountered at 36 cm from the       incisors. This appeared to be primarily due to thickening of the luminal       interface/superficial mucosa (Layer 1). Additionally, wall thickening       was noted in the following wall layer(s): deep mucosa (Layer 2).      Anechoic lesions suggestive of multiple cysts were identified in the       pancreatic head and genu of the pancreas the three largest are noted       below. The first cyst  in the head measured 8.7 mm by 7.4 mm. The second       cyst in the neck measured 11.4 mm by 7.2 mm. The third cyst measured 6.9       mm by 4.2 mm. There was no associated mass.      There was no sign of significant endosonographic abnormality in the       pancreatic head (2.3 mm), genu of the pancreas (1.9 mm), pancreatic body       (1.0 mm) and pancreatic tail (0.6 mm). No masses.      There was no sign of significant endosonographic abnormality in the       common bile duct (2.8 mm -> 3.1 mm) and in the common hepatic duct (7.2       mm). No stones and ducts of normal caliber were identified.      Endosonographic imaging of the ampulla showed no intramural        (subepithelial) lesion.      Endosonographic imaging in the visualized portion of the liver showed no       mass.      No malignant-appearing lymph nodes were visualized in the lower       paraesophageal mediastinum (level 8L), left gastric region (level 17)       and peripancreatic region.      The celiac plexus was visualized. Impression:               EGD Impression:                           - No gross lesions in esophagus.                           - A single mucosal papule (nodule) found in the                            stomach. Complete removal was accomplished. Clips                            (MR conditional) were placed.                           - Nodular mucosa in the gastric body and in the                            gastric antrum.                           - 3 cm hiatal hernia.                           - No other gross lesions in the stomach. Biopsied.                           - No gross lesions in the duodenal bulb, in the                            first portion of the duodenum and in the second  portion of the duodenum.                           - Normal ampulla.                           EUS Impression:                           - Wall thickening was seen in the gastroesophageal                            junction. The thickening appeared to primarily be                            within the luminal interface/superficial mucosa                            (Layer 1).                           - Multiple cystic lesions were seen in the                            pancreatic head and genu of the pancreas. Tissue                            has not been obtained. However, the endosonographic                            appearance is consistent with likely intraductal                            papillary mucinous neoplasms.                           - There was no other sign of significant pathology                            in the pancreatic  head, genu of the pancreas,                            pancreatic body and pancreatic tail.                           - There was no sign of significant pathology in the                            common bile duct and in the common hepatic duct.                           - No malignant-appearing lymph nodes were                            visualized in the lower paraesophageal mediastinum                            (  level 8L), left gastric region (level 17) and                            peripancreatic region. Recommendation:           - The patient will be observed post-procedure,                            until all discharge criteria are met.                           - Discharge patient to home.                           - Patient has a contact number available for                            emergencies. The signs and symptoms of potential                            delayed complications were discussed with the                            patient. Return to normal activities tomorrow.                            Written discharge instructions were provided to the                            patient.                           - Await path results.                           - Start Omeprazole 40 mg twice daily for 1 month                            and then decrease to once daily thereafter.                           - Repeat EGD/EUS based on final pathology will be                            determined.                           - The findings and recommendations were discussed                            with the patient.                           - The findings and recommendations were discussed  with the patient's family. Procedure Code(s):        --- Professional ---                           [id number], Esophagogastroduodenoscopy, flexible,                            transoral; with endoscopic ultrasound examination,                            including the  esophagus, stomach, and either the                            duodenum or a surgically altered stomach where the                            jejunum is examined distal to the anastomosis                           43251, Esophagogastroduodenoscopy, flexible,                            transoral; with removal of tumor(s), polyp(s), or                            other lesion(s) by snare technique Diagnosis Code(s):        --- Professional ---                           K31.89, Other diseases of stomach and duodenum                           K44.9, Diaphragmatic hernia without obstruction or                            gangrene                           K22.8, Other specified diseases of esophagus                           K86.2, Cyst of pancreas                           I89.9, Noninfective disorder of lymphatic vessels                            and lymph nodes, unspecified CPT copyright 2019 American Medical Association. All rights reserved. The codes documented in this report are preliminary and upon coder review may  be revised to meet current compliance requirements. Justice Britain, MD 10/31/2019 4:11:53 PM Number of Addenda: 0

## 2019-10-31 NOTE — Anesthesia Procedure Notes (Signed)
Procedure Name: MAC Date/Time: 10/31/2019 2:28 PM Performed by: Babs Bertin, CRNA Pre-anesthesia Checklist: Patient identified, Emergency Drugs available, Suction available, Patient being monitored and Timeout performed Patient Re-evaluated:Patient Re-evaluated prior to induction Oxygen Delivery Method: Nasal cannula

## 2019-10-31 NOTE — H&P (Signed)
GASTROENTEROLOGY PROCEDURE H&P NOTE   Primary Care Physician: Ronnell Freshwater, NP  HPI: Whitney [name] is a 69 y.o. female who presents for EGD/EUS of abnormal esophageal nodule and pancreatic cyst.  Past Medical History:  Diagnosis Date  . Abnormal Pap smear of cervix    Pt states she had colposcopy   . Arthritis   . Chronic kidney disease    removed left kidney  . Dyspnea   . Dysrhythmia   . Hx of unilateral nephrectomy 1979   Left Nephrectomy  . Hypertension   . Lower extremity edema   . Palpitations   . PMB (postmenopausal bleeding)   . Restless leg syndrome   . Sleep apnea    has no cpap  . Vitamin D deficiency    Past Surgical History:  Procedure Laterality Date  . ABDOMINAL HYSTERECTOMY    . CARDIAC CATHETERIZATION    . CHOLECYSTECTOMY    . COLONOSCOPY WITH PROPOFOL N/A 06/13/2019   Procedure: COLONOSCOPY WITH PROPOFOL;  Surgeon: [name], MD;  Location: [facility] ENDOSCOPY;  Service: Gastroenterology;  Laterality: N/A;  . COLONOSCOPY WITH PROPOFOL N/A 08/12/2019   Procedure: COLONOSCOPY WITH PROPOFOL;  Surgeon: [name], MD;  Location: [facility] ENDOSCOPY;  Service: Gastroenterology;  Laterality: N/A;  . COLONOSCOPY WITH PROPOFOL N/A 09/19/2019   Procedure: COLONOSCOPY WITH PROPOFOL;  Surgeon: [name], MD;  Location: [facility] ENDOSCOPY;  Service: Gastroenterology;  Laterality: N/A;  . ESOPHAGOGASTRODUODENOSCOPY (EGD) WITH PROPOFOL N/A 06/13/2019   Procedure: ESOPHAGOGASTRODUODENOSCOPY (EGD) WITH PROPOFOL;  Surgeon: [name], MD;  Location: [facility] ENDOSCOPY;  Service: Gastroenterology;  Laterality: N/A;  Pt will go for COVID test on [id number] as she uses public transportation and will be in the area on that day.   Marland Kitchen HYSTEROSCOPY WITH D & C N/A 02/16/2017   Procedure: DILATATION AND CURETTAGE /HYSTEROSCOPY;  Surgeon: Defrancesco, Alanda Slim, MD;  Location: [facility] ORS;  Service: Gynecology;  Laterality: N/A;  . JOINT REPLACEMENT    . kidney removal Left   . KIDNEY SURGERY  Left 1979   left kidney removed  . LEFT HEART CATH AND CORONARY ANGIOGRAPHY N/A 10/21/2017   Procedure: LEFT HEART CATH AND CORONARY ANGIOGRAPHY;  Surgeon: Teodoro Spray, MD;  Location: [place] CV LAB;  Service: Cardiovascular;  Laterality: N/A;  . TOTAL KNEE ARTHROPLASTY Left 07/06/2017   Procedure: TOTAL KNEE ARTHROPLASTY;  Surgeon: Lovell Sheehan, MD;  Location: [facility] ORS;  Service: Orthopedics;  Laterality: Left;  . WRIST ARTHROSCOPY Left 1970s   Current Facility-Administered Medications  Medication Dose Route Frequency Provider Last Rate Last Admin  . 0.9 %  sodium chloride infusion   Intravenous Continuous Mansouraty, Telford Nab., MD       Facility-Administered Medications Ordered in Other Encounters  Medication Dose Route Frequency Provider Last Rate Last Admin  . lactated ringers infusion   Intravenous Continuous PRN Harden Mo, CRNA   New Bag at 10/31/19 1214   Allergies  Allergen Reactions  . Enalapril Swelling  . Lisinopril Swelling   Family History  Problem Relation Age of Onset  . Ovarian cancer Mother   . Hypertension Father   . Diabetes Father   . Cancer Father   . Breast cancer Sister   . Diabetes Sister    Social History   Socioeconomic History  . Marital status: Divorced    Spouse name: Not on file  . Number of children: Not on file  . Years of education: Not on file  . Highest education level: Not on  file  Occupational History  . Not on file  Tobacco Use  . Smoking status: Never Smoker  . Smokeless tobacco: Never Used  Substance and Sexual Activity  . Alcohol use: Yes    Alcohol/week: 2.0 standard drinks    Types: 2 Standard drinks or equivalent per week    Comment: occasionally  . Drug use: Yes    Frequency: 3.0 times per week    Types: Marijuana, Cocaine    Comment: Pt states she uses for leg pain: marijuana; states no longer uses cocaine  . Sexual activity: Yes    Birth control/protection: None  Other Topics Concern  . Not on file    Social History Narrative  . Not on file   Social Determinants of Health   Financial Resource Strain:   . Difficulty of Paying Living Expenses:   Food Insecurity:   . Worried About Charity fundraiser in the Last Year:   . Arboriculturist in the Last Year:   Transportation Needs:   . Film/video editor (Medical):   Marland Kitchen Lack of Transportation (Non-Medical):   Physical Activity:   . Days of Exercise per Week:   . Minutes of Exercise per Session:   Stress:   . Feeling of Stress :   Social Connections:   . Frequency of Communication with Friends and Family:   . Frequency of Social Gatherings with Friends and Family:   . Attends Religious Services:   . Active Member of Clubs or Organizations:   . Attends Archivist Meetings:   Marland Kitchen Marital Status:   Intimate Partner Violence:   . Fear of Current or Ex-Partner:   . Emotionally Abused:   Marland Kitchen Physically Abused:   . Sexually Abused:     Physical Exam: Vital signs in last 24 hours: Temp:  [97.7 F (36.5 C)] 97.7 F (36.5 C) (03/29 1150) Pulse Rate:  [84] 84 (03/29 1150) Resp:  [21] 21 (03/29 1150) BP: (169)/(81) 169/81 (03/29 1150) SpO2:  [93 %] 93 % (03/29 1150)   GEN: NAD EYE: Sclerae anicteric ENT: MMM CV: Non-tachycardic GI: Soft, NT/ND NEURO:  Alert & Oriented x 3  Lab Results: No results for input(s): WBC, HGB, HCT, PLT in the last 72 hours. BMET No results for input(s): NA, K, CL, CO2, GLUCOSE, BUN, CREATININE, CALCIUM in the last 72 hours. LFT No results for input(s): PROT, ALBUMIN, AST, ALT, ALKPHOS, BILITOT, BILIDIR, IBILI in the last 72 hours. PT/INR No results for input(s): LABPROT, INR in the last 72 hours.   Impression / Plan: This is a 69 y.o.female  who presents for EGD/EUS of abnormal esophageal nodule and pancreatic cyst.  The risks of EUS including bleeding, infection, aspiration pneumonia and intestinal perforation were discussed as was the possibility it may not give a definitive  diagnosis.  If a biopsy of the pancreas is done as part of the EUS, there is an additional risk of pancreatitis at the rate of about 1%.  It was explained that procedure related pancreatitis is typically mild, although can be severe and even life threatening, which is why we do not perform random pancreatic biopsies and only biopsy a lesion we feel is concerning enough to warrant the risk.  The risks and benefits of endoscopic evaluation were discussed with the patient; these include but are not limited to the risk of perforation, infection, bleeding, missed lesions, lack of diagnosis, severe illness requiring hospitalization, as well as anesthesia and sedation related illnesses.  The patient  is agreeable to proceed.    Justice Britain, MD [facility] Office # [phone]  -

## 2019-10-31 NOTE — Transfer of Care (Signed)
Immediate Anesthesia Transfer of Care Note  Patient: Whitney [name]  Procedure(s) Performed: UPPER ESOPHAGEAL ENDOSCOPIC ULTRASOUND (EUS) (N/A ) ESOPHAGOGASTRODUODENOSCOPY (EGD) WITH PROPOFOL (N/A ) POLYPECTOMY BIOPSY  Patient Location: Endoscopy Unit  Anesthesia Type:MAC  Level of Consciousness: awake and alert   Airway & Oxygen Therapy: Patient Spontanous Breathing  Post-op Assessment: Report given to RN and Post -op Vital signs reviewed and stable  Post vital signs: Reviewed and stable  Last Vitals:  Vitals Value Taken Time  BP 128/60 10/31/19 1547  Temp    Pulse 89 10/31/19 1548  Resp 27 10/31/19 1548  SpO2 89 % 10/31/19 1548  Vitals shown include unvalidated device data.  Last Pain:  Vitals:   10/31/19 1150  TempSrc: Oral  PainSc: 7          Complications: No apparent anesthesia complications

## 2019-10-31 NOTE — Discharge Instructions (Signed)
YOU HAD AN ENDOSCOPIC PROCEDURE TODAY: Refer to the procedure report and other information in the discharge instructions given to you for any specific questions about what was found during the examination. If this information does not answer your questions, please call [place] office at [phone] to clarify.   YOU SHOULD EXPECT: Some feelings of bloating in the abdomen. Passage of more gas than usual. Walking can help get rid of the air that was put into your GI tract during the procedure and reduce the bloating. If you had a lower endoscopy (such as a colonoscopy or flexible sigmoidoscopy) you may notice spotting of blood in your stool or on the toilet paper. Some abdominal soreness may be present for a day or two, also.  DIET: Your first meal following the procedure should be a light meal and then it is ok to progress to your normal diet. A half-sandwich or bowl of soup is an example of a good first meal. Heavy or fried foods are harder to digest and may make you feel nauseous or bloated. Drink plenty of fluids but you should avoid alcoholic beverages for 24 hours. If you had a esophageal dilation, please see attached instructions for diet.    ACTIVITY: Your care partner should take you home directly after the procedure. You should plan to take it easy, moving slowly for the rest of the day. You can resume normal activity the day after the procedure however YOU SHOULD NOT DRIVE, use power tools, machinery or perform tasks that involve climbing or major physical exertion for 24 hours (because of the sedation medicines used during the test).   SYMPTOMS TO REPORT IMMEDIATELY: A gastroenterologist can be reached at any hour. Please call [phone]  for any of the following symptoms:   Following upper endoscopy (EGD, EUS, ERCP, esophageal dilation) Vomiting of blood or coffee ground material  New, significant abdominal pain  New, significant chest pain or pain under the shoulder blades  Painful or  persistently difficult swallowing  New shortness of breath  Black, tarry-looking or red, bloody stools  FOLLOW UP:  If any biopsies were taken you will be contacted by phone or by letter within the next 1-3 weeks. Call [phone]  if you have not heard about the biopsies in 3 weeks.  Please also call with any specific questions about appointments or follow up tests.  

## 2019-11-01 ENCOUNTER — Ambulatory Visit: Admission: RE | Admit: 2019-11-01 | Payer: Medicare Other | Source: Ambulatory Visit

## 2019-11-02 ENCOUNTER — Ambulatory Visit: Payer: Medicare Other | Admitting: Surgery

## 2019-11-02 LAB — SURGICAL PATHOLOGY

## 2019-11-03 ENCOUNTER — Telehealth: Payer: Self-pay

## 2019-11-03 NOTE — Telephone Encounter (Signed)
Spoke with Whitney [name] and informed her of Dr. Georgeann Oppenheim recommendations. Whitney [name] understands and agrees.

## 2019-11-03 NOTE — Telephone Encounter (Signed)
Inform the biopsies were benign.  Watch for her symptoms and color of her stools.  As long as her stools are brown and the pain does not get worse I expect to resolve soon.  Can take some Tums if needed.  If has any more blood being thrown up to call on-call GI doctor which is myself over the weekend.  Can take some Tylenol for head and back pain.

## 2019-11-03 NOTE — Telephone Encounter (Signed)
Patient had a surgery on Monday. Begun having blood in saliva, dime size amt. Hasn't seen any since 2am this morning. Head and back pain consistently since Mon. Patient says she did not rec any pain meds. Stomach sore

## 2019-11-05 ENCOUNTER — Encounter: Payer: Self-pay | Admitting: Gastroenterology

## 2019-11-07 ENCOUNTER — Other Ambulatory Visit: Payer: Self-pay

## 2019-11-07 ENCOUNTER — Ambulatory Visit (INDEPENDENT_AMBULATORY_CARE_PROVIDER_SITE_OTHER): Payer: Medicare Other | Admitting: Surgery

## 2019-11-07 ENCOUNTER — Encounter: Payer: Self-pay | Admitting: Surgery

## 2019-11-07 ENCOUNTER — Ambulatory Visit: Payer: Medicare Other

## 2019-11-07 VITALS — BP 128/85 | HR 81 | Temp 96.6°F | Ht 65.0 in | Wt 255.0 lb

## 2019-11-07 DIAGNOSIS — K432 Incisional hernia without obstruction or gangrene: Secondary | ICD-10-CM | POA: Diagnosis not present

## 2019-11-07 NOTE — H&P (View-Only) (Signed)
Outpatient Surgical Follow Up  11/07/2019  KAITLAIN WESSELMANN is an 69 y.o. female.   Chief Complaint  Patient presents with  . Pre-op Exam    HPI: [name] is a 69 year old female well-known to me with known history of symptomatic ventral hernia along with an umbilical hernia.  She recently underwent an EUS to evaluate for pancreatic cyst.  Have personally reviewed and discussed with her the findings.  They are very reassuring there is no evidence of significant images that are concerning for cancer.  She continues to have intermittent pains that are moderate in intensity that worsen with Valsalva within the abdominal wall.  No fevers no chills no evidence of incarceration or strangulation.  I once again reviewed the CT scan showing evidence of 2 ventral hernias and with some piece of large bowel in it.  No evidence of strangulation or incarceration.  She is due for a knee replacement but wishes to have his ventral hernia repair first.  Walks with a cane.  Past Medical History:  Diagnosis Date  . Abnormal Pap smear of cervix    Pt states she had colposcopy   . Arthritis   . Chronic kidney disease    removed left kidney  . Dyspnea   . Dysrhythmia   . Hx of unilateral nephrectomy 1979   Left Nephrectomy  . Hypertension   . Lower extremity edema   . Palpitations   . PMB (postmenopausal bleeding)   . Restless leg syndrome   . Sleep apnea    has no cpap  . Vitamin D deficiency     Past Surgical History:  Procedure Laterality Date  . ABDOMINAL HYSTERECTOMY    . BIOPSY  10/31/2019   Procedure: BIOPSY;  Surgeon: Rush Landmark Telford Nab., MD;  Location: [place];  Service: Gastroenterology;;  . CARDIAC CATHETERIZATION    . CHOLECYSTECTOMY    . COLONOSCOPY WITH PROPOFOL N/A 06/13/2019   Procedure: COLONOSCOPY WITH PROPOFOL;  Surgeon: [name], MD;  Location: [facility] ENDOSCOPY;  Service: Gastroenterology;  Laterality: N/A;  . COLONOSCOPY WITH PROPOFOL N/A 08/12/2019   Procedure: COLONOSCOPY  WITH PROPOFOL;  Surgeon: [name], MD;  Location: [facility] ENDOSCOPY;  Service: Gastroenterology;  Laterality: N/A;  . COLONOSCOPY WITH PROPOFOL N/A 09/19/2019   Procedure: COLONOSCOPY WITH PROPOFOL;  Surgeon: [name], MD;  Location: [facility] ENDOSCOPY;  Service: Gastroenterology;  Laterality: N/A;  . ESOPHAGOGASTRODUODENOSCOPY (EGD) WITH PROPOFOL N/A 06/13/2019   Procedure: ESOPHAGOGASTRODUODENOSCOPY (EGD) WITH PROPOFOL;  Surgeon: [name], MD;  Location: [facility] ENDOSCOPY;  Service: Gastroenterology;  Laterality: N/A;  Pt will go for COVID test on [id number] as she uses public transportation and will be in the area on that day.   . ESOPHAGOGASTRODUODENOSCOPY (EGD) WITH PROPOFOL N/A 10/31/2019   Procedure: ESOPHAGOGASTRODUODENOSCOPY (EGD) WITH PROPOFOL;  Surgeon: Rush Landmark Telford Nab., MD;  Location: [place];  Service: Gastroenterology;  Laterality: N/A;  . HEMOSTASIS CLIP PLACEMENT  10/31/2019   Procedure: HEMOSTASIS CLIP PLACEMENT;  Surgeon: Irving Copas., MD;  Location: [place];  Service: Gastroenterology;;  . HYSTEROSCOPY WITH D & C N/A 02/16/2017   Procedure: DILATATION AND CURETTAGE [place];  Surgeon: Brayton Mars, MD;  Location: [facility]S;  Service: Gynecology;  Laterality: N/A;  . JOINT REPLACEMENT    . kidney removal Left   . KIDNEY SURGERY Left 1979   left kidney removed  . LEFT HEART CATH AND CORONARY ANGIOGRAPHY N/A 10/21/2017   Procedure: LEFT HEART CATH AND CORONARY ANGIOGRAPHY;  Surgeon: Teodoro Spray, MD;  Location: [place]  CV LAB;  Service: Cardiovascular;  Laterality: N/A;  . POLYPECTOMY  10/31/2019   Procedure: POLYPECTOMY;  Surgeon: Rush Landmark Telford Nab., MD;  Location: [place];  Service: Gastroenterology;;  . TOTAL KNEE ARTHROPLASTY Left 07/06/2017   Procedure: TOTAL KNEE ARTHROPLASTY;  Surgeon: Lovell Sheehan, MD;  Location: [facility] ORS;  Service: Orthopedics;  Laterality: Left;  . UPPER ESOPHAGEAL ENDOSCOPIC ULTRASOUND (EUS) N/A  10/31/2019   Procedure: UPPER ESOPHAGEAL ENDOSCOPIC ULTRASOUND (EUS);  Surgeon: Irving Copas., MD;  Location: [place];  Service: Gastroenterology;  Laterality: N/A;  . WRIST ARTHROSCOPY Left 1970s    Family History  Problem Relation Age of Onset  . Ovarian cancer Mother   . Hypertension Father   . Diabetes Father   . Cancer Father   . Breast cancer Sister   . Diabetes Sister     Social History:  reports that she has never smoked. She has never used smokeless tobacco. She reports current alcohol use of about 2.0 standard drinks of alcohol per week. She reports current drug use. Frequency: 3.00 times per week. Drugs: Marijuana and Cocaine.  Allergies:  Allergies  Allergen Reactions  . Enalapril Swelling  . Lisinopril Swelling    Medications reviewed.    ROS Full ROS performed and is otherwise negative other than what is stated in HPI   BP 128/85   Pulse 81   Temp (!) 96.6 F (35.9 C)   Ht 5\' 5"  (1.651 m)   Wt 255 lb (115.7 kg)   LMP 01/26/2017 Comment: age 82  SpO2 95%   BMI 42.43 kg/m   Physical Exam Vitals and nursing note reviewed. Exam conducted with a chaperone present.  Constitutional:      General: She is not in acute distress.    Appearance: Normal appearance. She is obese.  Cardiovascular:     Rate and Rhythm: Normal rate and regular rhythm.  Pulmonary:     Effort: Pulmonary effort is normal. No respiratory distress.     Breath sounds: Normal breath sounds. No stridor. No rhonchi.  Abdominal:     General: Abdomen is flat. There is no distension.     Palpations: Abdomen is soft.     Tenderness: There is abdominal tenderness. There is no rebound.     Hernia: A hernia is present.     Comments: Evidence of a reducible ventral hernia.  Mild tenderness palpation.  No peritonitis.  Musculoskeletal:     Cervical back: Normal range of motion and neck supple. No rigidity.  Lymphadenopathy:     Cervical: No cervical adenopathy.  Skin:     General: Skin is warm and dry.     Capillary Refill: Capillary refill takes less than 2 seconds.  Neurological:     General: No focal deficit present.     Mental Status: She is alert and oriented to person, place, and time.  Psychiatric:        Mood and Affect: Mood normal.        Behavior: Behavior normal.        Thought Content: Thought content normal.        Judgment: Judgment normal.        Assessment/Plan: 69 year old female with symptomatic ventral hernia in need for repair.  I definitely recommend this to be repair and I do think that she will be a good candidate for robotic approach.  Procedure discussed with the patient in detail.  Risk, benefits and possible applications including but not limited to: Bleeding, infection injury to  adjacent structures mesh issues and chronic pain.  She understands and wished to proceed    Greater than 50% of the 40 minutes  visit was spent in counseling/coordination of care   Caroleen Hamman, MD [place] Surgeon

## 2019-11-07 NOTE — Patient Instructions (Signed)
You have requested to have a Ventral Hernia Repair. This will be done by Dr Whitney [name] at [facility]. Please see your (BLUE) Pre-care sheet for more information.  Our surgery scheduler will contact you to look at surgery dates and go over surgery information as well as Covid-19 testing information.  You will need to arrange to be out of work for approximately 1-2 weeks and then you may return with a lifting restriction for 4 more weeks. If you have FMLA or Disability paperwork that needs to be filled out, please have your company fax your paperwork to [phone] or you may drop this by either office. This paperwork will be filled out within 3 days after your surgery has been completed.  Ventral Hernia A ventral hernia (also called an incisional hernia) is a hernia that occurs at the site of a previous surgical cut (incision) in the abdomen. The abdominal wall spans from your lower chest down to your pelvis. If the abdominal wall is weakened from a surgical incision, a hernia can occur. A hernia is a bulge of bowel or muscle tissue pushing out on the weakened part of the abdominal wall. Ventral hernias can get bigger from straining or lifting. Obese and older people are at higher risk for a ventral hernia. People who develop infections after surgery or require repeat incisions at the same site on the abdomen are also at increased risk. CAUSES  A ventral hernia occurs because of weakness in the abdominal wall at an incision site.  SYMPTOMS  Common symptoms include:  A visible bulge or lump on the abdominal wall.  Pain or tenderness around the lump.  Increased discomfort if you cough or make a sudden movement. If the hernia has blocked part of the intestine, a serious complication can occur (incarcerated or strangulated hernia). This can become a problem that requires emergency surgery because the blood flow to the blocked intestine may be cut off. Symptoms may include:  Feeling sick to your stomach  (nauseous).  Throwing up (vomiting).  Stomach swelling (distention) or bloating.  Fever.  Rapid heartbeat. DIAGNOSIS  Your health care provider will take a medical history and perform a physical exam. Various tests may be ordered, such as:  Blood tests.  Urine tests.  Ultrasonography.  X-rays.  Computed tomography (CT). TREATMENT  Watchful waiting may be all that is needed for a smaller hernia that does not cause symptoms. Your health care provider may recommend the use of a supportive belt (truss) that helps to keep the abdominal wall intact. For larger hernias or those that cause pain, surgery to repair the hernia is usually recommended. If a hernia becomes strangulated, emergency surgery needs to be done right away. HOME CARE INSTRUCTIONS  Avoid putting pressure or strain on the abdominal area.  Avoid heavy lifting.  Use good body positioning for physical tasks. Ask your health care provider about proper body positioning.  Use a supportive belt as directed by your health care provider.  Maintain a healthy weight.  Eat foods that are high in fiber, such as whole grains, fruits, and vegetables. Fiber helps prevent difficult bowel movements (constipation).  Drink enough fluids to keep your urine clear or pale yellow.  Follow up with your health care provider as directed. SEEK MEDICAL CARE IF:   Your hernia seems to be getting larger or more painful. SEEK IMMEDIATE MEDICAL CARE IF:   You have abdominal pain that is sudden and sharp.  Your pain becomes severe.  You have repeated vomiting.  You are sweating a lot.  You notice a rapid heartbeat.  You develop a fever. MAKE SURE YOU:   Understand these instructions.  Will watch your condition.  Will get help right away if you are not doing well or get worse.   This information is not intended to replace advice given to you by your health care provider. Make sure you discuss any questions you have with your  health care provider.   Document Released: 07/07/2012 Document Revised: 08/11/2014 Document Reviewed: 07/07/2012 Elsevier Interactive Patient Education 2016 Elsevier Inc.    Laparoscopic Ventral Hernia Repair Laparoscopic ventral hernia repairis a surgery to fix a ventral hernia. Aventral hernia, also called an incisional hernia, is a bulge of body tissue or intestines that pushes through the front part of the abdomen. This can happen if the connective tissue covering the muscles over the abdomen has a weak spot or is torn because of a surgical cut (incision) from a previous surgery. Laparoscopic ventral hernia repair is often done soon after diagnosis to stop the hernia from getting bigger, becoming uncomfortable, or becoming an emergency. This surgery usually takes about 2 hours, but the time can vary greatly. LET [facility] CARE PROVIDER KNOW ABOUT:  Any allergies you have.  All medicines you are taking, including steroids, vitamins, herbs, eye drops, creams, and over-the-counter medicines.  Previous problems you or members of your family have had with the use of anesthetics.  Any blood disorders you have.  Previous surgeries you have had.  Medical conditions you have. RISKS AND COMPLICATIONS  Generally, laparoscopic ventral hernia repair is a safe procedure. However, as with any surgical procedure, problems can occur. Possible problems include:  Bleeding.  Trouble passing urine or having a bowel movement after the surgery.  Infection.  Pneumonia.  Blood clots.  Pain in the area of the hernia.  A bulge in the area of the hernia that may be caused by a collection of fluid.  Injury to intestines or other structures in the abdomen.  Return of the hernia after surgery. In some cases, your health care provider may need to stop the laparoscopic procedure and do regular, open surgery. This may be necessary for very difficult hernias, when organs are hard to see, or when  bleeding problems occur during surgery. BEFORE THE PROCEDURE   You may need to have blood tests, urine tests, a chest X-ray, or an electrocardiogram done before the day of the surgery.  Ask your health care provider about changing or stopping your regular medicines. This is especially important if you are taking diabetes medicines or blood thinners.  You may need to wash with a special type of germ-killing soap.  Do not eat or drink anything after midnight the night before the procedure or as directed by your health care provider.  Make plans to have someone drive you home after the procedure. PROCEDURE   Small monitors will be put on your body. They are used to check your heart, blood pressure, and oxygen level.  An IV access tube will be put into a vein in your hand or arm. Fluids and medicine will flow directly into your body through the IV tube.  You will be given medicine that makes you go to sleep (general anesthetic).  Your abdomen will be cleaned with a special soap to kill any germs on your skin.  Once you are asleep, several small incisions will be made in your abdomen.  The large space in your abdomen will be filled  with air so that it expands. This gives your health care provider more room and a better view.  A thin, lighted tube with a tiny camera on the end (laparoscope) is put through a small incision in your abdomen. The camera on the laparoscope sends a picture to a TV screen in the operating room. This gives your health care provider a good view inside your abdomen.  Hollow tubes are put through the other small incisions in your abdomen. The tools needed for the procedure are put through these tubes.  Your health care provider puts the tissue or intestines that formed the hernia back in place.  A screen-like patch (mesh) is used to close the hernia. This helps make the area stronger. Stitches, tacks, or staples are used to keep the mesh in place.  Medicine and a  bandage (dressing) or skin glue will be put over the incisions. AFTER THE PROCEDURE   You will stay in a recovery area until the anesthetic wears off. Your blood pressure and pulse will be checked often.  You may be able to go home the same day or may need to stay in the hospital for 1-2 days after surgery. Your health care provider will decide when you can go home.  You may feel some pain. You may be given medicine for pain.  You will be urged to do breathing exercises that involve taking deep breaths. This helps prevent a lung infection after a surgery.  You may have to wear compression stockings while you are in the hospital. These stockings help keep blood clots from forming in your legs.   This information is not intended to replace advice given to you by your health care provider. Make sure you discuss any questions you have with your health care provider.   Document Released: 07/07/2012 Document Revised: 07/26/2013 Document Reviewed: 07/07/2012 Elsevier Interactive Patient Education [employer].

## 2019-11-07 NOTE — Progress Notes (Signed)
Outpatient Surgical Follow Up  11/07/2019  Whitney [name] is an 68 y.o. female.   Chief Complaint  Patient presents with  . Pre-op Exam    HPI: Whitney [name] is a 69 year old female well-known to me with known history of symptomatic ventral hernia along with an umbilical hernia.  She recently underwent an EUS to evaluate for pancreatic cyst.  Have personally reviewed and discussed with her the findings.  They are very reassuring there is no evidence of significant images that are concerning for cancer.  She continues to have intermittent pains that are moderate in intensity that worsen with Valsalva within the abdominal wall.  No fevers no chills no evidence of incarceration or strangulation.  I once again reviewed the CT scan showing evidence of 2 ventral hernias and with some piece of large bowel in it.  No evidence of strangulation or incarceration.  She is due for a knee replacement but wishes to have his ventral hernia repair first.  Walks with a cane.  Past Medical History:  Diagnosis Date  . Abnormal Pap smear of cervix    Pt states she had colposcopy   . Arthritis   . Chronic kidney disease    removed left kidney  . Dyspnea   . Dysrhythmia   . Hx of unilateral nephrectomy 1979   Left Nephrectomy  . Hypertension   . Lower extremity edema   . Palpitations   . PMB (postmenopausal bleeding)   . Restless leg syndrome   . Sleep apnea    has no cpap  . Vitamin D deficiency     Past Surgical History:  Procedure Laterality Date  . ABDOMINAL HYSTERECTOMY    . BIOPSY  10/31/2019   Procedure: BIOPSY;  Surgeon: Whitney Landmark Telford Nab., MD;  Location: [place];  Service: Gastroenterology;;  . CARDIAC CATHETERIZATION    . CHOLECYSTECTOMY    . COLONOSCOPY WITH PROPOFOL N/A 06/13/2019   Procedure: COLONOSCOPY WITH PROPOFOL;  Surgeon: Whitney Bellows, MD;  Location: [facility] ENDOSCOPY;  Service: Gastroenterology;  Laterality: N/A;  . COLONOSCOPY WITH PROPOFOL N/A 08/12/2019   Procedure: COLONOSCOPY  WITH PROPOFOL;  Surgeon: Whitney Bellows, MD;  Location: [facility] ENDOSCOPY;  Service: Gastroenterology;  Laterality: N/A;  . COLONOSCOPY WITH PROPOFOL N/A 09/19/2019   Procedure: COLONOSCOPY WITH PROPOFOL;  Surgeon: Whitney Bellows, MD;  Location: [facility] ENDOSCOPY;  Service: Gastroenterology;  Laterality: N/A;  . ESOPHAGOGASTRODUODENOSCOPY (EGD) WITH PROPOFOL N/A 06/13/2019   Procedure: ESOPHAGOGASTRODUODENOSCOPY (EGD) WITH PROPOFOL;  Surgeon: Whitney Bellows, MD;  [name];  Service: Gastroenterology;  Laterality: N/A;  Pt will go for COVID test on [id number] as she uses public transportation and will be in the area on that day.   . ESOPHAGOGASTRODUODENOSCOPY (EGD) WITH PROPOFOL N/A 10/31/2019   Procedure: ESOPHAGOGASTRODUODENOSCOPY (EGD) WITH PROPOFOL;  Surgeon: Whitney Landmark Telford Nab., MD;  Location: [place];  Service: Gastroenterology;  Laterality: N/A;  . HEMOSTASIS CLIP PLACEMENT  10/31/2019   Procedure: HEMOSTASIS CLIP PLACEMENT;  Surgeon: Whitney [name]., MD;  Location: [place];  Service: Gastroenterology;;  . HYSTEROSCOPY WITH D & C N/A 02/16/2017   Procedure: DILATATION AND CURETTAGE Whitney [name];  Surgeon: Whitney Mars, MD;  Location: [facility];  Service: Gynecology;  Laterality: N/A;  . JOINT REPLACEMENT    . kidney removal Left   . KIDNEY SURGERY Left 1979   left kidney removed  . LEFT HEART CATH AND CORONARY ANGIOGRAPHY N/A 10/21/2017   Procedure: LEFT HEART CATH AND CORONARY ANGIOGRAPHY;  Surgeon: Whitney Spray, MD;  Location: [place]  CV LAB;  Service: Cardiovascular;  Laterality: N/A;  . POLYPECTOMY  10/31/2019   Procedure: POLYPECTOMY;  Surgeon: Whitney Landmark Telford Nab., MD;  Location: [place];  Service: Gastroenterology;;  . TOTAL KNEE ARTHROPLASTY Left 07/06/2017   Procedure: TOTAL KNEE ARTHROPLASTY;  Surgeon: Whitney Sheehan, MD;  Location: [facility];  Service: Orthopedics;  Laterality: Left;  . UPPER ESOPHAGEAL ENDOSCOPIC ULTRASOUND (EUS) N/A  10/31/2019   Procedure: UPPER ESOPHAGEAL ENDOSCOPIC ULTRASOUND (EUS);  Surgeon: Whitney [name]., MD;  Location: [place];  Service: Gastroenterology;  Laterality: N/A;  . WRIST ARTHROSCOPY Left 1970s    Family History  Problem Relation Age of Onset  . Ovarian cancer Mother   . Hypertension Father   . Diabetes Father   . Cancer Father   . Breast cancer Sister   . Diabetes Sister     Social History:  reports that she has never smoked. She has never used smokeless tobacco. She reports current alcohol use of about 2.0 standard drinks of alcohol per week. She reports current drug use. Frequency: 3.00 times per week. Drugs: Marijuana and Cocaine.  Allergies:  Allergies  Allergen Reactions  . Enalapril Swelling  . Lisinopril Swelling    Medications reviewed.    ROS Full ROS performed and is otherwise negative other than what is stated in HPI   BP 128/85   Pulse 81   Temp (!) 96.6 F (35.9 C)   Ht 5\' 5"  (1.651 m)   Wt 255 lb (115.7 kg)   LMP 01/26/2017 Comment: age 62  SpO2 95%   BMI 42.43 kg/m   Physical Exam Vitals and nursing note reviewed. Exam conducted with a chaperone present.  Constitutional:      General: She is not in acute distress.    Appearance: Normal appearance. She is obese.  Cardiovascular:     Rate and Rhythm: Normal rate and regular rhythm.  Pulmonary:     Effort: Pulmonary effort is normal. No respiratory distress.     Breath sounds: Normal breath sounds. No stridor. No rhonchi.  Abdominal:     General: Abdomen is flat. There is no distension.     Palpations: Abdomen is soft.     Tenderness: There is abdominal tenderness. There is no rebound.     Hernia: A hernia is present.     Comments: Evidence of a reducible ventral hernia.  Mild tenderness palpation.  No peritonitis.  Musculoskeletal:     Cervical back: Normal range of motion and neck supple. No rigidity.  Lymphadenopathy:     Cervical: No cervical adenopathy.  Skin:     General: Skin is warm and dry.     Capillary Refill: Capillary refill takes less than 2 seconds.  Neurological:     General: No focal deficit present.     Mental Status: She is alert and oriented to person, place, and time.  Psychiatric:        Mood and Affect: Mood normal.        Behavior: Behavior normal.        Thought Content: Thought content normal.        Judgment: Judgment normal.        Assessment/Plan: 69 year old female with symptomatic ventral hernia in need for repair.  I definitely recommend this to be repair and I do think that she will be a good candidate for robotic approach.  Procedure discussed with the patient in detail.  Risk, benefits and possible applications including but not limited to: Bleeding, infection injury to  adjacent structures mesh issues and chronic pain.  She understands and wished to proceed    Greater than 50% of the 40 minutes  visit was spent in counseling/coordination of care   Caroleen Hamman, MD [place] Surgeon

## 2019-11-10 ENCOUNTER — Telehealth: Payer: Self-pay | Admitting: Surgery

## 2019-11-10 NOTE — Telephone Encounter (Signed)
Outgoing call made, left message for patient to call to advise of Pre-Admission date/time, COVID Testing date and Surgery date.  Please advise of the following:  Surgery Date: 12/01/19 Preadmission Testing Date: 11/23/19 (phone 8a-1p) Covid Testing Date: 11/29/19 - patient advised to go to the [place] ([place] between 8a-1p   Also have patient to call [phone], between 1-3:00pm the day before surgery, to find out what time to arrive for surgery.

## 2019-11-11 NOTE — Telephone Encounter (Signed)
Outgoing call is made.  I was able to speak with the patient.  She is now informed of all her dates regarding her surgery and voices understanding.

## 2019-11-13 DIAGNOSIS — H524 Presbyopia: Secondary | ICD-10-CM | POA: Diagnosis not present

## 2019-11-18 ENCOUNTER — Other Ambulatory Visit: Payer: Self-pay | Admitting: Nurse Practitioner

## 2019-11-18 DIAGNOSIS — I10 Essential (primary) hypertension: Secondary | ICD-10-CM | POA: Diagnosis not present

## 2019-11-18 DIAGNOSIS — Z0001 Encounter for general adult medical examination with abnormal findings: Secondary | ICD-10-CM | POA: Diagnosis not present

## 2019-11-18 DIAGNOSIS — E559 Vitamin D deficiency, unspecified: Secondary | ICD-10-CM | POA: Diagnosis not present

## 2019-11-18 DIAGNOSIS — R7301 Impaired fasting glucose: Secondary | ICD-10-CM | POA: Diagnosis not present

## 2019-11-18 DIAGNOSIS — E782 Mixed hyperlipidemia: Secondary | ICD-10-CM | POA: Diagnosis not present

## 2019-11-19 LAB — COMPREHENSIVE METABOLIC PANEL
ALT: 12 IU/L (ref 0–32)
AST: 17 IU/L (ref 0–40)
Albumin/Globulin Ratio: 1.3 (ref 1.2–2.2)
Albumin: 3.8 g/dL (ref 3.8–4.8)
Alkaline Phosphatase: 97 IU/L (ref 39–117)
BUN/Creatinine Ratio: 15 (ref 12–28)
BUN: 18 mg/dL (ref 8–27)
Bilirubin Total: 0.3 mg/dL (ref 0.0–1.2)
CO2: 23 mmol/L (ref 20–29)
Calcium: 9 mg/dL (ref 8.7–10.3)
Chloride: 104 mmol/L (ref 96–106)
Creatinine, Ser: 1.19 mg/dL — ABNORMAL HIGH (ref 0.57–1.00)
GFR calc Af Amer: 54 mL/min/{1.73_m2} — ABNORMAL LOW (ref >59)
GFR calc non Af Amer: 47 mL/min/{1.73_m2} — ABNORMAL LOW (ref >59)
Globulin, Total: 3 g/dL (ref 1.5–4.5)
Glucose: 83 mg/dL (ref 65–99)
Potassium: 4.7 mmol/L (ref 3.5–5.2)
Sodium: 142 mmol/L (ref 134–144)
Total Protein: 6.8 g/dL (ref 6.0–8.5)

## 2019-11-19 LAB — LIPID PANEL W/O CHOL/HDL RATIO
Cholesterol, Total: 225 mg/dL — ABNORMAL HIGH (ref 100–199)
HDL: 59 mg/dL (ref >39)
LDL Chol Calc (NIH): 148 mg/dL — ABNORMAL HIGH (ref 0–99)
Triglycerides: 104 mg/dL (ref 0–149)
VLDL Cholesterol Cal: 18 mg/dL (ref 5–40)

## 2019-11-19 LAB — TSH: TSH: 2.06 u[IU]/mL (ref 0.450–4.500)

## 2019-11-19 LAB — CBC
Hematocrit: 39.9 % (ref 34.0–46.6)
Hemoglobin: 13.2 g/dL (ref 11.1–15.9)
MCH: 29.4 pg (ref 26.6–33.0)
MCHC: 33.1 g/dL (ref 31.5–35.7)
MCV: 89 fL (ref 79–97)
Platelets: 317 10*3/uL (ref 150–450)
RBC: 4.49 x10E6/uL (ref 3.77–5.28)
RDW: 12.6 % (ref 11.7–15.4)
WBC: 6.1 10*3/uL (ref 3.4–10.8)

## 2019-11-19 LAB — HCV COMMENT:

## 2019-11-19 LAB — VITAMIN D 25 HYDROXY (VIT D DEFICIENCY, FRACTURES): Vit D, 25-Hydroxy: 32.3 ng/mL (ref 30.0–100.0)

## 2019-11-19 LAB — HGB A1C W/O EAG: Hgb A1c MFr Bld: 5.7 % — ABNORMAL HIGH (ref 4.8–5.6)

## 2019-11-19 LAB — HEPATITIS C ANTIBODY (REFLEX): HCV Ab: <0.1 s/co ratio (ref 0.0–0.9)

## 2019-11-19 LAB — T4, FREE: Free T4: 0.91 ng/dL (ref 0.82–1.77)

## 2019-11-22 ENCOUNTER — Ambulatory Visit
Admission: RE | Admit: 2019-11-22 | Discharge: 2019-11-22 | Disposition: A | Discharge status: Home / Self Care | Payer: Medicare Other | Source: Ambulatory Visit | Attending: Otolaryngology | Admitting: Otolaryngology

## 2019-11-22 ENCOUNTER — Other Ambulatory Visit: Payer: Self-pay

## 2019-11-22 DIAGNOSIS — E041 Nontoxic single thyroid nodule: Secondary | ICD-10-CM | POA: Diagnosis not present

## 2019-11-22 DIAGNOSIS — R221 Localized swelling, mass and lump, neck: Secondary | ICD-10-CM | POA: Diagnosis not present

## 2019-11-23 ENCOUNTER — Encounter: Admission: RE | Admit: 2019-11-23 | Payer: Medicare Other | Source: Ambulatory Visit

## 2019-11-23 NOTE — Addendum Note (Signed)
Addended by: Caroleen Hamman F on: 11/23/2019 09:05 AM   Modules accepted: Orders, SmartSet

## 2019-11-23 NOTE — Progress Notes (Signed)
Improved renal functions. Other labs normal. Discuss at next visit.

## 2019-11-24 ENCOUNTER — Encounter
Admission: RE | Admit: 2019-11-24 | Discharge: 2019-11-24 | Disposition: A | Discharge status: Home / Self Care | Payer: Medicare Other | Source: Ambulatory Visit | Attending: Surgery | Admitting: Surgery

## 2019-11-24 ENCOUNTER — Other Ambulatory Visit: Payer: Self-pay

## 2019-11-24 DIAGNOSIS — Z01818 Encounter for other preprocedural examination: Secondary | ICD-10-CM | POA: Insufficient documentation

## 2019-11-24 HISTORY — DX: Anxiety disorder, unspecified: F41.9

## 2019-11-24 HISTORY — DX: Depression, unspecified: F32.A

## 2019-11-24 NOTE — Patient Instructions (Addendum)
Your procedure is scheduled on: Thursday December 01, 2019 Report to Day Surgery inside [facility]. To find out your arrival time please call [phone] between 1PM - 3PM on Wednesday November 30, 2019.  Remember: Instructions that are not followed completely may result in serious medical risk,  up to and including death, or upon the discretion of your surgeon and anesthesiologist your  surgery may need to be rescheduled.     _X__ 1. Do not eat food after midnight the night before your procedure.                 No gum chewing or hard candies. You may drink clear liquids up to 2 hours                 before you are scheduled to arrive for your surgery- DO not drink clear                 liquids within 2 hours of the start of your surgery.                 Clear Liquids include:  water, apple juice without pulp, clear Gatorade, G2 or                  Gatorade Zero (avoid Red/Purple/Blue), Black Coffee or Tea (Do not add                 anything to coffee or tea).  __X__2.  On the morning of surgery brush your teeth with toothpaste and water, you                may rinse your mouth with mouthwash if you wish.  Do not swallow any toothpaste of mouthwash.     _X__ 3.  No Alcohol for 24 hours before or after surgery.   _X__ 4.  Do Not Smoke or use e-cigarettes For 24 Hours Prior to Your Surgery.                 Do not use any chewable tobacco products for at least 6 hours prior to                 Surgery.  _X__  5.  Do not use any recreational drugs (marijuana, cocaine, heroin, ecstacy, MDMA or other)                For at least one week prior to your surgery.  Combination of these drugs with anesthesia                May have life threatening results.  _X___ .5  Notify your doctor if there is any change in your medical condition      (cold, fever, infections).     Do not wear jewelry, make-up, hairpins, clips or nail polish. Do not wear lotions, powders, or  perfumes. You may wear deodorant. Do not shave 48 hours prior to surgery. Men may shave face and neck. Do not bring valuables to the hospital.    [facility] is not responsible for any belongings or valuables.  Contacts, dentures or bridgework may not be worn into surgery. Leave your suitcase in the car. After surgery it may be brought to your room. For patients admitted to the hospital, discharge time is determined by your treatment team.   Patients discharged the day of surgery will not be allowed to drive home.   Make arrangements for someone to be with you  for the first 24 hours of your Same Day Discharge.    __X__ Take these medicines the morning of surgery with A SIP OF WATER:    1. amLODipine (NORVASC)   2. carvedilol (COREG)   3. flecainide (TAMBOCOR)   4. gabapentin (NEURONTIN)   5. omeprazole (PRILOSEC)    6. rOPINIRole (REQUIP)     __X__ Use CHG Soap as directed  __X__ Stop Anti-inflammatories such as ibuprofen, Aleve, naproxen, aspirin and or BC powders.   __X__ Stop supplements until after surgery.    __X__ Do not start any herbal supplements before your surgery.

## 2019-11-28 ENCOUNTER — Encounter: Payer: Self-pay | Admitting: Emergency Medicine

## 2019-11-28 ENCOUNTER — Emergency Department
Admission: EM | Admit: 2019-11-28 | Discharge: 2019-11-28 | Disposition: A | Discharge status: Home / Self Care | Payer: Medicare Other | Attending: Emergency Medicine | Admitting: Emergency Medicine

## 2019-11-28 ENCOUNTER — Other Ambulatory Visit: Payer: Self-pay

## 2019-11-28 ENCOUNTER — Telehealth: Payer: Self-pay

## 2019-11-28 ENCOUNTER — Emergency Department: Payer: Medicare Other

## 2019-11-28 DIAGNOSIS — M19012 Primary osteoarthritis, left shoulder: Secondary | ICD-10-CM | POA: Insufficient documentation

## 2019-11-28 DIAGNOSIS — Z905 Acquired absence of kidney: Secondary | ICD-10-CM | POA: Insufficient documentation

## 2019-11-28 DIAGNOSIS — M25512 Pain in left shoulder: Secondary | ICD-10-CM | POA: Diagnosis not present

## 2019-11-28 DIAGNOSIS — Z7982 Long term (current) use of aspirin: Secondary | ICD-10-CM | POA: Diagnosis not present

## 2019-11-28 DIAGNOSIS — Z96652 Presence of left artificial knee joint: Secondary | ICD-10-CM | POA: Diagnosis not present

## 2019-11-28 DIAGNOSIS — N189 Chronic kidney disease, unspecified: Secondary | ICD-10-CM | POA: Insufficient documentation

## 2019-11-28 DIAGNOSIS — Z79899 Other long term (current) drug therapy: Secondary | ICD-10-CM | POA: Diagnosis not present

## 2019-11-28 DIAGNOSIS — I129 Hypertensive chronic kidney disease with stage 1 through stage 4 chronic kidney disease, or unspecified chronic kidney disease: Secondary | ICD-10-CM | POA: Diagnosis not present

## 2019-11-28 DIAGNOSIS — F1210 Cannabis abuse, uncomplicated: Secondary | ICD-10-CM | POA: Insufficient documentation

## 2019-11-28 DIAGNOSIS — F1410 Cocaine abuse, uncomplicated: Secondary | ICD-10-CM | POA: Insufficient documentation

## 2019-11-28 MED ORDER — TRAMADOL HCL 50 MG PO TABS: 50.0000 mg | ORAL_TABLET | Freq: Two times a day (BID) | ORAL | 0 refills | Status: DC | PRN

## 2019-11-28 MED ORDER — ACETAMINOPHEN 325 MG PO TABS
650.0000 mg | ORAL_TABLET | Freq: Once | ORAL | Status: AC
  Administered 2019-11-28: 650 mg via ORAL
  Filled 2019-11-28: qty 2

## 2019-11-28 MED ORDER — TRAMADOL HCL 50 MG PO TABS
50.0000 mg | ORAL_TABLET | Freq: Once | ORAL | Status: AC
  Administered 2019-11-28: 50 mg via ORAL
  Filled 2019-11-28: qty 1

## 2019-11-28 NOTE — Telephone Encounter (Signed)
Tammy-after office nurse called to let us know she spoke with patient- patient stated she is having left arm pain and chest pain- Tammy instructed patient to go to ED.  Patient is scheduled for surgery Thursday 12/01/19 Ventral Hernia Dr.Pabon has been notified.

## 2019-11-28 NOTE — ED Triage Notes (Signed)
Pt here for left shoulder pain. Worse with movement.  Tender on palpation.  No known injury. No cardiac hx.

## 2019-11-28 NOTE — ED Notes (Signed)
See triage note  Presents with pain to left neck/shoulder area for almost 1 week area is tender on palpation  States pain is moving from neck into left arm

## 2019-11-28 NOTE — ED Provider Notes (Signed)
[facility] Emergency Department Provider Note ____________________________________________  Time seen: Approximately 5:30 PM  I have reviewed the triage vital signs and the nursing notes.   HISTORY  Chief Complaint No chief complaint on file.    HPI Whitney [name] is a 69 y.o. female who presents to the emergency department for evaluation and treatment of left shoulder pain.  No injury that she can recall.  Symptoms started about a week ago.  Pain increased with movement.   Past Medical History:  Diagnosis Date  . Abnormal Pap smear of cervix    Pt states she had colposcopy   . Anxiety   . Arthritis   . Chronic kidney disease    removed left kidney  . Depression   . Dyspnea   . Dysrhythmia   . Hx of unilateral nephrectomy 1979   Left Nephrectomy  . Hypertension   . Lower extremity edema   . Palpitations   . PMB (postmenopausal bleeding)   . Restless leg syndrome   . Sleep apnea    has no cpap  . Vitamin D deficiency     Patient Active Problem List   Diagnosis Date Noted  . Fever blister 10/15/2019  . Cerumen in auditory canal on examination 10/15/2019  . Bilateral hearing loss 10/15/2019  . Need for vaccination against Streptococcus pneumoniae using pneumococcal conjugate vaccine 13 10/15/2019  . Encounter for screening mammogram for malignant neoplasm of breast 10/15/2019  . Screening for osteoporosis 10/15/2019  . Elevated erythrocyte sedimentation rate 11/02/2018  . Lumbar spondylosis 11/02/2018  . PVC's (premature ventricular contractions) 10/18/2018  . Primary osteoarthritis of right knee 09/15/2018  . Encounter for pre-operative examination 09/15/2018  . Obstructive sleep apnea 09/15/2018  . Calculus of gallbladder without cholecystitis without obstruction 09/15/2018  . Calculus of gallbladder with cholecystitis without biliary obstruction 01/27/2018  . Restless leg syndrome 01/27/2018  . Generalized abdominal pain 01/11/2018  .  Cyst of pancreas 01/11/2018  . Other specified disorders of kidney and ureter 01/11/2018  . Primary insomnia 01/11/2018  . Paroxysmal atrial fibrillation ([place]) 11/12/2017  . Essential hypertension 10/25/2017  . Mixed hyperlipidemia 10/25/2017  . Low back pain at multiple sites 10/25/2017  . Dysuria 10/25/2017  . Encounter for general adult medical examination with abnormal findings 10/25/2017  . Vitamin D deficiency 10/25/2017  . Near syncope   . Symptomatic bradycardia 10/19/2017  . Carpal tunnel syndrome 10/12/2017  . Primary osteoarthritis of left knee 07/09/2017  . History of total knee arthroplasty 07/06/2017  . Chest pain 06/29/2017  . Postop check 02/25/2017  . Impingement syndrome of shoulder region 01/20/2017  . Neck pain 01/20/2017  . Obesity (BMI 35.0-39.9 without comorbidity) 07/15/2016  . Endometrial polyp 07/15/2016  . Morbid obesity ([place]) 07/15/2016  . Family history of breast cancer in first degree relative 07/15/2016  . Family history of ovarian cancer 07/15/2016  . Knee pain 07/04/2016  . Bilateral leg pain 06/24/2016    Past Surgical History:  Procedure Laterality Date  . BIOPSY  10/31/2019   Procedure: BIOPSY;  Surgeon: Rush Landmark Telford Nab., MD;  Location: [place];  Service: Gastroenterology;;  . CARDIAC CATHETERIZATION    . CHOLECYSTECTOMY    . COLONOSCOPY WITH PROPOFOL N/A 06/13/2019   Procedure: COLONOSCOPY WITH PROPOFOL;  Surgeon: [name], MD;  Location: [facility] ENDOSCOPY;  Service: Gastroenterology;  Laterality: N/A;  . COLONOSCOPY WITH PROPOFOL N/A 08/12/2019   Procedure: COLONOSCOPY WITH PROPOFOL;  Surgeon: [name], MD;  Location: [facility] ENDOSCOPY;  Service: Gastroenterology;  Laterality: N/A;  . COLONOSCOPY WITH PROPOFOL N/A 09/19/2019   Procedure: COLONOSCOPY WITH PROPOFOL;  Surgeon: [name], MD;  Location: [facility] ENDOSCOPY;  Service: Gastroenterology;  Laterality: N/A;  . DILATION AND CURETTAGE OF UTERUS    . ESOPHAGOGASTRODUODENOSCOPY  (EGD) WITH PROPOFOL N/A 06/13/2019   Procedure: ESOPHAGOGASTRODUODENOSCOPY (EGD) WITH PROPOFOL;  Surgeon: [name], MD;  Location: [facility] ENDOSCOPY;  Service: Gastroenterology;  Laterality: N/A;  Pt will go for COVID test on [id number] as she uses public transportation and will be in the area on that day.   . ESOPHAGOGASTRODUODENOSCOPY (EGD) WITH PROPOFOL N/A 10/31/2019   Procedure: ESOPHAGOGASTRODUODENOSCOPY (EGD) WITH PROPOFOL;  Surgeon: Rush Landmark Telford Nab., MD;  Location: [place];  Service: Gastroenterology;  Laterality: N/A;  . HEMOSTASIS CLIP PLACEMENT  10/31/2019   Procedure: HEMOSTASIS CLIP PLACEMENT;  Surgeon: Irving Copas., MD;  Location: [place];  Service: Gastroenterology;;  . HYSTEROSCOPY WITH D & C N/A 02/16/2017   Procedure: DILATATION AND CURETTAGE [place];  Surgeon: Brayton Mars, MD;  Location: [facility] ORS;  Service: Gynecology;  Laterality: N/A;  . JOINT REPLACEMENT    . kidney removal Left   . KIDNEY SURGERY Left 1979   left kidney removed  . LEFT HEART CATH AND CORONARY ANGIOGRAPHY N/A 10/21/2017   Procedure: LEFT HEART CATH AND CORONARY ANGIOGRAPHY;  Surgeon: Teodoro Spray, MD;  Location: [facility];  Service: Cardiovascular;  Laterality: N/A;  . POLYPECTOMY  10/31/2019   Procedure: POLYPECTOMY;  Surgeon: Rush Landmark Telford Nab., MD;  Location: [place];  Service: Gastroenterology;;  . TOTAL KNEE ARTHROPLASTY Left 07/06/2017   Procedure: TOTAL KNEE ARTHROPLASTY;  Surgeon: Lovell Sheehan, MD;  Location: [facility] ORS;  Service: Orthopedics;  Laterality: Left;  . UPPER ESOPHAGEAL ENDOSCOPIC ULTRASOUND (EUS) N/A 10/31/2019   Procedure: UPPER ESOPHAGEAL ENDOSCOPIC ULTRASOUND (EUS);  Surgeon: Irving Copas., MD;  Location: [place];  Service: Gastroenterology;  Laterality: N/A;  . WRIST ARTHROSCOPY Left 1970s    Prior to Admission medications   Medication Sig Start Date End Date Taking? Authorizing Provider  acetaminophen  (TYLENOL) 500 MG tablet Take 1,000 mg by mouth every 6 (six) hours as needed for moderate pain or headache.    [provider]  amLODipine (NORVASC) 5 MG tablet Take 1 tablet (5 mg total) by mouth 2 (two) times daily. 10/11/19   Ronnell Freshwater, NP  aspirin EC 81 MG tablet Take 81 mg by mouth daily.    [provider]  atorvastatin (LIPITOR) 40 MG tablet Take 1 tablet (40 mg total) by mouth daily at 6 PM. Patient taking differently: Take 40 mg by mouth at bedtime.  09/29/19   Kendell Bane, NP  carvedilol (COREG) 3.125 MG tablet TAKE 2 TABLETS(6.25 MG) BY MOUTH TWICE DAILY WITH A MEAL Patient taking differently: Take 6.25 mg by mouth in the morning and at bedtime.  08/30/19   Lavera Guise, MD  Cholecalciferol (VITAMIN D) 50 MCG (2000 UT) tablet Take 2,000 Units by mouth in the morning and at bedtime.     [provider]  diclofenac Sodium (VOLTAREN) 1 % GEL Apply 1 application topically 4 (four) times daily as needed (pain).     [provider]  flecainide (TAMBOCOR) 100 MG tablet Take 100 mg by mouth 2 (two) times daily as needed (palpitations).     [provider]  gabapentin (NEURONTIN) 100 MG capsule Take 1 capsule (100 mg total) by mouth 2 (two) times daily. Patient taking differently: Take 100 mg by  mouth 2 (two) times daily as needed (pain).  10/22/17   Loletha Grayer, MD  hydrALAZINE (APRESOLINE) 25 MG tablet Take 1 tablet (25 mg total) by mouth 3 (three) times daily. 06/28/19   Kendell Bane, NP  meclizine (ANTIVERT) 25 MG tablet Take 1 tablet (25 mg total) by mouth 2 (two) times daily as needed for dizziness. 06/28/19   Kendell Bane, NP  omeprazole (PRILOSEC) 40 MG capsule Take 1 capsule (40 mg total) by mouth 2 (two) times daily before a meal. For 1 month.  Then decrease to once daily dosing thereafter.  Please take before meals. 10/31/19 12/30/19  Mansouraty, Telford Nab., MD  orlistat (ALLI) 60 MG capsule Take 60 mg by mouth 3 (three)  times daily with meals.    [provider]  OVER THE COUNTER MEDICATION Apply 1 application topically in the morning and at bedtime. Tummy cream    [provider]  rOPINIRole (REQUIP) 0.5 MG tablet Takes 1 or 2 tablets daily as needed Patient taking differently: Take 0.5 mg by mouth 2 (two) times daily as needed (restless legs).  01/11/18   Ronnell Freshwater, NP  traMADol (ULTRAM) 50 MG tablet Take 1 tablet (50 mg total) by mouth every 12 (twelve) hours as needed. 11/28/19   Rhyli Depaula, Johnette Abraham B, FNP    Allergies Enalapril and Lisinopril  Family History  Problem Relation Age of Onset  . Ovarian cancer Mother   . Hypertension Father   . Diabetes Father   . Cancer Father   . Breast cancer Sister   . Diabetes Sister     Social History Social History   Tobacco Use  . Smoking status: Never Smoker  . Smokeless tobacco: Never Used  Substance Use Topics  . Alcohol use: Yes    Alcohol/week: 2.0 standard drinks    Types: 2 Standard drinks or equivalent per week    Comment: occasionally  . Drug use: Yes    Frequency: 3.0 times per week    Types: Marijuana, Cocaine    Comment: Pt states she uses for leg pain: marijuana; states no longer uses cocaine    Review of Systems Constitutional: Negative for fever. Cardiovascular: Negative for chest pain. Respiratory: Negative for shortness of breath. Musculoskeletal: Positive for left shoulder pain. Skin: Negative for open wound or lesion. Neurological: Negative for decrease in sensation  ____________________________________________   PHYSICAL EXAM:  VITAL SIGNS: ED Triage Vitals  Enc Vitals Group     BP 11/28/19 1024 (!) 169/90     Pulse Rate 11/28/19 1024 76     Resp 11/28/19 1024 18     Temp 11/28/19 1024 98 F (36.7 C)     Temp Source 11/28/19 1024 Oral     SpO2 11/28/19 1024 96 %     Weight 11/28/19 1022 255 lb (115.7 kg)     Height 11/28/19 1022 5\' 5"  (1.651 m)     Head Circumference --      Peak Flow --       Pain Score 11/28/19 1022 10     Pain Loc --      Pain Edu? --      Excl. in [place]? --     Constitutional: Alert and oriented. Well appearing and in no acute distress. Eyes: Conjunctivae are clear without discharge or drainage Head: Atraumatic Neck: Supple.  Respiratory: No cough. Respirations are even and unlabored. Musculoskeletal: Left shoulder diffuse tenderness that increases with attempts to abduct or externally rotate. Neurologic: No radiculopathy  Skin: Intact.  No erythema or edema on exposed skin. Psychiatric: Affect and behavior are appropriate.  ____________________________________________   LABS (all labs ordered are listed, but only abnormal results are displayed)  Labs Reviewed - No data to display ____________________________________________  RADIOLOGY  Severe arthropathy in the glenohumeral and acromioclavicular joints without fracture or dislocation  I, Drexler Maland, personally viewed and evaluated these images (plain radiographs) as part of my medical decision making, as well as reviewing the written report by the radiologist.  DG Shoulder Left  Result Date: 11/28/2019 CLINICAL DATA:  Pain EXAM: LEFT SHOULDER - 2+ VIEW COMPARISON:  None. FINDINGS: Oblique, Y scapular, and axillary images were obtained. No fracture or dislocation. There is marked arthropathy in the acromioclavicular and glenohumeral joints. There is marked bony overgrowth in these areas with marked narrowing in both the acromioclavicular and glenohumeral joints. No erosive change or intra-articular calcification. Visualized left lung clear. IMPRESSION: Severe arthropathy in the glenohumeral and acromioclavicular joints. No fracture or dislocation. Electronically Signed   By: Lowella Grip III M.D.   On: 11/28/2019 12:12   ____________________________________________   PROCEDURES  Procedures  ____________________________________________   INITIAL IMPRESSION / ASSESSMENT AND PLAN / ED  COURSE  DONZELLA HEMMERT is a 69 y.o. who presents to the emergency department for treatment and evaluation of nontraumatic left shoulder pain.  See HPI for further details.  X-ray shows that she has severe arthritis.  Results discussed with the patient.  She is unable to tolerate NSAIDs due to to nephrectomy.  Plan will be to treat her with tramadol and Tylenol.  Prednisone was considered, however she has an upcoming hernia repair and I would prefer to avoid having her on steroids prior to a surgical procedure.  Patient instructed to follow-up with orthopedics if her symptoms are not improving with rest and medication.  She was also instructed to return to the emergency department for symptoms that change or worsen if unable schedule an appointment with orthopedics or primary care.  Medications  traMADol (ULTRAM) tablet 50 mg (50 mg Oral Given 11/28/19 1307)  acetaminophen (TYLENOL) tablet 650 mg (650 mg Oral Given 11/28/19 1307)    Pertinent labs & imaging results that were available during my care of the patient were reviewed by me and considered in my medical decision making (see chart for details).   _________________________________________   FINAL CLINICAL IMPRESSION(S) / ED DIAGNOSES  Final diagnoses:  Primary osteoarthritis of left shoulder    ED Discharge Orders         Ordered    traMADol (ULTRAM) 50 MG tablet  Every 12 hours PRN     11/28/19 1332           If controlled substance prescribed during this visit, 12 month history viewed on the [place] prior to issuing an initial prescription for Schedule II or III opiod.   Victorino Dike, FNP 11/28/19 1736    Harvest Dark, MD 11/29/19 1156

## 2019-11-28 NOTE — Discharge Instructions (Signed)
Follow up with orthopedics if not improving with rest and medication.  Return to the ER for symptoms that change or worsen or for new concerns if unable to see primary care or orthopedics.

## 2019-11-29 ENCOUNTER — Telehealth: Payer: Self-pay | Admitting: *Deleted

## 2019-11-29 ENCOUNTER — Other Ambulatory Visit
Admission: RE | Admit: 2019-11-29 | Discharge: 2019-11-29 | Disposition: A | Discharge status: Home / Self Care | Payer: Medicare Other | Source: Ambulatory Visit | Attending: Surgery | Admitting: Surgery

## 2019-11-29 DIAGNOSIS — Z20822 Contact with and (suspected) exposure to covid-19: Secondary | ICD-10-CM | POA: Diagnosis not present

## 2019-11-29 DIAGNOSIS — Z01812 Encounter for preprocedural laboratory examination: Secondary | ICD-10-CM | POA: Insufficient documentation

## 2019-11-29 LAB — SARS CORONAVIRUS 2 (TAT 6-24 HRS): SARS Coronavirus 2: NEGATIVE

## 2019-11-29 NOTE — Telephone Encounter (Signed)
Pt states she went to the ED yesterday for left arm pain and was diagnosed with arthritis. Pt was prescribed Tramadol.  Pt states she is having sx 4/29 and wants to know if she needs to stop medications including aspirin. Last dose of aspirin was Sunday 11/27/19. Advised pt to not take the aspirin and after sx they will make her aware of when to resume it. Pt advised it was okay to continue Tramadol and tylenol for the left arm pain.  Pt voiced understanding and has no further concerns at this time.

## 2019-11-29 NOTE — Telephone Encounter (Signed)
Patient called and stated that she is having surgery on 12/01/19 by Dr Dahlia Byes and she is taking  81mg  aspirin and tramadol. Does she need to stop the medications prior to surgery. Please call and advise

## 2019-12-01 ENCOUNTER — Ambulatory Visit: Payer: Medicare Other | Admitting: Certified Registered"

## 2019-12-01 ENCOUNTER — Encounter: Payer: Self-pay | Admitting: Surgery

## 2019-12-01 ENCOUNTER — Ambulatory Visit
Admission: RE | Admit: 2019-12-01 | Discharge: 2019-12-01 | Disposition: A | Discharge status: Home / Self Care | Payer: Medicare Other | Attending: Surgery | Admitting: Surgery

## 2019-12-01 ENCOUNTER — Other Ambulatory Visit: Payer: Self-pay

## 2019-12-01 ENCOUNTER — Encounter
Admission: RE | Disposition: A | Discharge status: Home / Self Care | Payer: Self-pay | Source: Home / Self Care | Attending: Surgery

## 2019-12-01 DIAGNOSIS — I129 Hypertensive chronic kidney disease with stage 1 through stage 4 chronic kidney disease, or unspecified chronic kidney disease: Secondary | ICD-10-CM | POA: Insufficient documentation

## 2019-12-01 DIAGNOSIS — M1990 Unspecified osteoarthritis, unspecified site: Secondary | ICD-10-CM | POA: Diagnosis not present

## 2019-12-01 DIAGNOSIS — Z888 Allergy status to other drugs, medicaments and biological substances status: Secondary | ICD-10-CM | POA: Insufficient documentation

## 2019-12-01 DIAGNOSIS — K432 Incisional hernia without obstruction or gangrene: Secondary | ICD-10-CM | POA: Diagnosis not present

## 2019-12-01 DIAGNOSIS — K439 Ventral hernia without obstruction or gangrene: Secondary | ICD-10-CM | POA: Insufficient documentation

## 2019-12-01 DIAGNOSIS — Z8249 Family history of ischemic heart disease and other diseases of the circulatory system: Secondary | ICD-10-CM | POA: Insufficient documentation

## 2019-12-01 DIAGNOSIS — G4730 Sleep apnea, unspecified: Secondary | ICD-10-CM | POA: Insufficient documentation

## 2019-12-01 DIAGNOSIS — Z905 Acquired absence of kidney: Secondary | ICD-10-CM | POA: Diagnosis not present

## 2019-12-01 DIAGNOSIS — N189 Chronic kidney disease, unspecified: Secondary | ICD-10-CM | POA: Insufficient documentation

## 2019-12-01 DIAGNOSIS — K429 Umbilical hernia without obstruction or gangrene: Secondary | ICD-10-CM | POA: Insufficient documentation

## 2019-12-01 DIAGNOSIS — E782 Mixed hyperlipidemia: Secondary | ICD-10-CM | POA: Diagnosis not present

## 2019-12-01 HISTORY — PX: XI ROBOTIC ASSISTED VENTRAL HERNIA: SHX6789

## 2019-12-01 LAB — URINE DRUG SCREEN, QUALITATIVE (ARMC ONLY)
Amphetamines, Ur Screen: NOT DETECTED
Barbiturates, Ur Screen: NOT DETECTED
Benzodiazepine, Ur Scrn: NOT DETECTED
Cannabinoid 50 Ng, Ur ~~LOC~~: POSITIVE — AB
Cocaine Metabolite,Ur ~~LOC~~: NOT DETECTED
MDMA (Ecstasy)Ur Screen: NOT DETECTED
Methadone Scn, Ur: NOT DETECTED
Opiate, Ur Screen: NOT DETECTED
Phencyclidine (PCP) Ur S: NOT DETECTED
Tricyclic, Ur Screen: NOT DETECTED

## 2019-12-01 SURGERY — REPAIR, HERNIA, VENTRAL, ROBOT-ASSISTED
Anesthesia: General | Site: Abdomen

## 2019-12-01 MED ORDER — CARVEDILOL 12.5 MG PO TABS
ORAL_TABLET | ORAL | Status: AC
  Administered 2019-12-01: 6.25 mg via ORAL
  Filled 2019-12-01: qty 1

## 2019-12-01 MED ORDER — SUGAMMADEX SODIUM 500 MG/5ML IV SOLN
INTRAVENOUS | Status: DC | PRN
  Administered 2019-12-01: 230 mg via INTRAVENOUS

## 2019-12-01 MED ORDER — CHLORHEXIDINE GLUCONATE CLOTH 2 % EX PADS: 6.0000 | MEDICATED_PAD | Freq: Once | CUTANEOUS | Status: DC

## 2019-12-01 MED ORDER — ONDANSETRON HCL 4 MG/2ML IJ SOLN
INTRAMUSCULAR | Status: AC
  Filled 2019-12-01: qty 2

## 2019-12-01 MED ORDER — CARVEDILOL 12.5 MG PO TABS: 6.2500 mg | ORAL_TABLET | Freq: Once | ORAL | Status: AC

## 2019-12-01 MED ORDER — BUPIVACAINE-EPINEPHRINE 0.25% -1:200000 IJ SOLN
INTRAMUSCULAR | Status: DC | PRN
  Administered 2019-12-01: 30 mL

## 2019-12-01 MED ORDER — SUGAMMADEX SODIUM 500 MG/5ML IV SOLN
INTRAVENOUS | Status: AC
  Filled 2019-12-01: qty 5

## 2019-12-01 MED ORDER — ONDANSETRON HCL 4 MG/2ML IJ SOLN
INTRAMUSCULAR | Status: AC
  Filled 2019-12-01: qty 4

## 2019-12-01 MED ORDER — SODIUM CHLORIDE 0.9 % IV SOLN
INTRAVENOUS | Status: DC | PRN
  Administered 2019-12-01: 15:00:00 30 ug/min via INTRAVENOUS

## 2019-12-01 MED ORDER — ROCURONIUM BROMIDE 100 MG/10ML IV SOLN
INTRAVENOUS | Status: DC | PRN
  Administered 2019-12-01: 40 mg via INTRAVENOUS
  Administered 2019-12-01: 20 mg via INTRAVENOUS
  Administered 2019-12-01: 10 mg via INTRAVENOUS

## 2019-12-01 MED ORDER — HYDROCODONE-ACETAMINOPHEN 5-325 MG PO TABS
ORAL_TABLET | ORAL | Status: AC
  Administered 2019-12-01: 1
  Filled 2019-12-01: qty 1

## 2019-12-01 MED ORDER — EPHEDRINE SULFATE 50 MG/ML IJ SOLN
INTRAMUSCULAR | Status: DC | PRN
  Administered 2019-12-01: 10 mg via INTRAVENOUS

## 2019-12-01 MED ORDER — HYDROCODONE-ACETAMINOPHEN 7.5-325 MG PO TABS: 1.0000 | ORAL_TABLET | Freq: Once | ORAL | Status: DC

## 2019-12-01 MED ORDER — HYDROCODONE-ACETAMINOPHEN 5-325 MG PO TABS
ORAL_TABLET | ORAL | Status: AC
  Administered 2019-12-01: 1 via ORAL
  Filled 2019-12-01: qty 1

## 2019-12-01 MED ORDER — CEFAZOLIN SODIUM-DEXTROSE 2-4 GM/100ML-% IV SOLN
INTRAVENOUS | Status: AC
  Filled 2019-12-01: qty 100

## 2019-12-01 MED ORDER — HYDROCODONE-ACETAMINOPHEN 5-325 MG PO TABS: 1.0000 | ORAL_TABLET | Freq: Once | ORAL | Status: AC

## 2019-12-01 MED ORDER — PROPOFOL 10 MG/ML IV BOLUS
INTRAVENOUS | Status: AC
  Filled 2019-12-01: qty 20

## 2019-12-01 MED ORDER — CEFAZOLIN SODIUM-DEXTROSE 2-4 GM/100ML-% IV SOLN
2.0000 g | INTRAVENOUS | Status: AC
  Administered 2019-12-01: 14:00:00 2 g via INTRAVENOUS

## 2019-12-01 MED ORDER — PHENYLEPHRINE HCL (PRESSORS) 10 MG/ML IV SOLN
INTRAVENOUS | Status: DC | PRN
  Administered 2019-12-01: 200 ug via INTRAVENOUS
  Administered 2019-12-01 (×4): 100 ug via INTRAVENOUS

## 2019-12-01 MED ORDER — CELECOXIB 200 MG PO CAPS: 200.0000 mg | ORAL_CAPSULE | ORAL | Status: AC

## 2019-12-01 MED ORDER — LIDOCAINE HCL (CARDIAC) PF 100 MG/5ML IV SOSY
PREFILLED_SYRINGE | INTRAVENOUS | Status: DC | PRN
  Administered 2019-12-01: 100 mg via INTRAVENOUS

## 2019-12-01 MED ORDER — LIDOCAINE HCL (PF) 2 % IJ SOLN
INTRAMUSCULAR | Status: AC
  Filled 2019-12-01: qty 10

## 2019-12-01 MED ORDER — DEXAMETHASONE SODIUM PHOSPHATE 10 MG/ML IJ SOLN
INTRAMUSCULAR | Status: AC
  Filled 2019-12-01: qty 1

## 2019-12-01 MED ORDER — LACTATED RINGERS IV SOLN
INTRAVENOUS | Status: DC | PRN
Start: 2019-12-01 — End: 2019-12-01

## 2019-12-01 MED ORDER — FENTANYL CITRATE (PF) 100 MCG/2ML IJ SOLN
INTRAMUSCULAR | Status: AC
  Filled 2019-12-01: qty 2

## 2019-12-01 MED ORDER — PROPOFOL 10 MG/ML IV BOLUS
INTRAVENOUS | Status: DC | PRN
  Administered 2019-12-01: 200 mg via INTRAVENOUS

## 2019-12-01 MED ORDER — BUPIVACAINE LIPOSOME 1.3 % IJ SUSP
INTRAMUSCULAR | Status: DC | PRN
  Administered 2019-12-01: 20 mL

## 2019-12-01 MED ORDER — ONDANSETRON HCL 4 MG/2ML IJ SOLN
4.0000 mg | Freq: Once | INTRAMUSCULAR | Status: AC | PRN
  Administered 2019-12-01: 4 mg via INTRAVENOUS

## 2019-12-01 MED ORDER — HYDROCODONE-ACETAMINOPHEN 5-325 MG PO TABS: 1.0000 | ORAL_TABLET | Freq: Four times a day (QID) | ORAL | 0 refills | Status: DC | PRN

## 2019-12-01 MED ORDER — ACETAMINOPHEN 500 MG PO TABS: 1000.0000 mg | ORAL_TABLET | ORAL | Status: AC

## 2019-12-01 MED ORDER — SODIUM CHLORIDE FLUSH 0.9 % IV SOLN
INTRAVENOUS | Status: AC
  Filled 2019-12-01: qty 10

## 2019-12-01 MED ORDER — MIDAZOLAM HCL 2 MG/2ML IJ SOLN
INTRAMUSCULAR | Status: AC
  Filled 2019-12-01: qty 2

## 2019-12-01 MED ORDER — CELECOXIB 200 MG PO CAPS
ORAL_CAPSULE | ORAL | Status: AC
  Administered 2019-12-01: 200 mg via ORAL
  Filled 2019-12-01: qty 1

## 2019-12-01 MED ORDER — GLYCOPYRROLATE 0.2 MG/ML IJ SOLN
INTRAMUSCULAR | Status: AC
  Filled 2019-12-01: qty 1

## 2019-12-01 MED ORDER — FENTANYL CITRATE (PF) 100 MCG/2ML IJ SOLN
INTRAMUSCULAR | Status: AC
  Administered 2019-12-01: 25 ug via INTRAVENOUS
  Filled 2019-12-01: qty 2

## 2019-12-01 MED ORDER — LACTATED RINGERS IV SOLN: INTRAVENOUS | Status: DC

## 2019-12-01 MED ORDER — MIDAZOLAM HCL 2 MG/2ML IJ SOLN
INTRAMUSCULAR | Status: DC | PRN
  Administered 2019-12-01: 2 mg via INTRAVENOUS

## 2019-12-01 MED ORDER — SUCCINYLCHOLINE CHLORIDE 20 MG/ML IJ SOLN
INTRAMUSCULAR | Status: DC | PRN
  Administered 2019-12-01: 120 mg via INTRAVENOUS

## 2019-12-01 MED ORDER — DEXAMETHASONE SODIUM PHOSPHATE 10 MG/ML IJ SOLN
INTRAMUSCULAR | Status: DC | PRN
  Administered 2019-12-01: 10 mg via INTRAVENOUS

## 2019-12-01 MED ORDER — FENTANYL CITRATE (PF) 100 MCG/2ML IJ SOLN
25.0000 ug | INTRAMUSCULAR | Status: DC | PRN
  Administered 2019-12-01 (×4): 25 ug via INTRAVENOUS

## 2019-12-01 MED ORDER — FENTANYL CITRATE (PF) 100 MCG/2ML IJ SOLN
INTRAMUSCULAR | Status: DC | PRN
  Administered 2019-12-01 (×2): 50 ug via INTRAVENOUS

## 2019-12-01 MED ORDER — BUPIVACAINE HCL (PF) 0.25 % IJ SOLN
INTRAMUSCULAR | Status: AC
  Filled 2019-12-01: qty 30

## 2019-12-01 MED ORDER — BUPIVACAINE LIPOSOME 1.3 % IJ SUSP
INTRAMUSCULAR | Status: AC
  Filled 2019-12-01: qty 20

## 2019-12-01 MED ORDER — EPINEPHRINE PF 1 MG/ML IJ SOLN
INTRAMUSCULAR | Status: AC
  Filled 2019-12-01: qty 1

## 2019-12-01 MED ORDER — ONDANSETRON HCL 4 MG/2ML IJ SOLN
INTRAMUSCULAR | Status: DC | PRN
  Administered 2019-12-01: 4 mg via INTRAVENOUS

## 2019-12-01 MED ORDER — ACETAMINOPHEN 500 MG PO TABS
ORAL_TABLET | ORAL | Status: AC
  Administered 2019-12-01: 1000 mg via ORAL
  Filled 2019-12-01: qty 2

## 2019-12-01 SURGICAL SUPPLY — 52 items
"PENCIL ELECTRO HAND CTR " (MISCELLANEOUS) ×1 IMPLANT
BLADE SURG SZ11 CARB STEEL (BLADE) ×2 IMPLANT
CANISTER SUCT 1200ML W/VALVE (MISCELLANEOUS) ×2 IMPLANT
CANNULA REDUC XI 12-8 STAPL (CANNULA) ×1
CANNULA REDUCER 12-8 DVNC XI (CANNULA) IMPLANT
CHLORAPREP W/TINT 26 (MISCELLANEOUS) ×2 IMPLANT
COVER TIP SHEARS 8 DVNC (MISCELLANEOUS) ×1 IMPLANT
COVER TIP SHEARS 8MM DA VINCI (MISCELLANEOUS) ×1
COVER WAND RF STERILE (DRAPES) ×2 IMPLANT
DEFOGGER SCOPE WARMER CLEARIFY (MISCELLANEOUS) ×2 IMPLANT
DERMABOND ADVANCED (GAUZE/BANDAGES/DRESSINGS) ×1
DERMABOND ADVANCED .7 DNX12 (GAUZE/BANDAGES/DRESSINGS) ×1 IMPLANT
DRAPE 3/4 80X56 (DRAPES) ×2 IMPLANT
DRAPE ARM DVNC X/XI (DISPOSABLE) ×4 IMPLANT
DRAPE COLUMN DVNC XI (DISPOSABLE) ×1 IMPLANT
DRAPE DA VINCI XI ARM (DISPOSABLE) ×4
DRAPE DA VINCI XI COLUMN (DISPOSABLE) ×1
ELECT CAUTERY BLADE 6.4 (BLADE) ×2 IMPLANT
ELECT REM PT RETURN 9FT ADLT (ELECTROSURGICAL) ×2
ELECTRODE REM PT RTRN 9FT ADLT (ELECTROSURGICAL) ×1 IMPLANT
GLOVE BIO SURGEON STRL SZ7 (GLOVE) ×6 IMPLANT
GOWN STRL REUS W/ TWL LRG LVL3 (GOWN DISPOSABLE) ×3 IMPLANT
GOWN STRL REUS W/TWL LRG LVL3 (GOWN DISPOSABLE) ×4
GRASPER SUT TROCAR 14GX15 (MISCELLANEOUS) ×2 IMPLANT
IRRIGATION STRYKERFLOW (MISCELLANEOUS) IMPLANT
IRRIGATOR STRYKERFLOW (MISCELLANEOUS)
IV NS 1000ML (IV SOLUTION)
IV NS 1000ML BAXH (IV SOLUTION) IMPLANT
KIT PINK PAD W/HEAD ARE REST (MISCELLANEOUS) ×2
KIT PINK PAD W/HEAD ARM REST (MISCELLANEOUS) ×1 IMPLANT
LABEL OR SOLS (LABEL) ×2 IMPLANT
MESH VENT LT ST 10.2X15.2CM EL (Mesh General) ×1 IMPLANT
NEEDLE HYPO 22GX1.5 SAFETY (NEEDLE) ×2 IMPLANT
OBTURATOR OPTICAL STANDARD 8MM (TROCAR) ×1
OBTURATOR OPTICAL STND 8 DVNC (TROCAR) ×1
OBTURATOR OPTICALSTD 8 DVNC (TROCAR) ×1 IMPLANT
PACK LAP CHOLECYSTECTOMY (MISCELLANEOUS) ×2 IMPLANT
PENCIL ELECTRO HAND CTR (MISCELLANEOUS) ×2 IMPLANT
SEAL CANN UNIV 5-8 DVNC XI (MISCELLANEOUS) ×3 IMPLANT
SEAL XI 5MM-8MM UNIVERSAL (MISCELLANEOUS) ×3
SET TUBE SMOKE EVAC HIGH FLOW (TUBING) ×2 IMPLANT
SOLUTION ELECTROLUBE (MISCELLANEOUS) ×2 IMPLANT
SPONGE LAP 18X18 RF (DISPOSABLE) ×2 IMPLANT
STAPLER CANNULA SEAL DVNC XI (STAPLE) IMPLANT
STAPLER CANNULA SEAL XI (STAPLE) ×1
SUT MNCRL 4-0 (SUTURE) ×2
SUT MNCRL 4-0 27XMFL (SUTURE) ×2
SUT STRATAFIX PDS 30 CT-1 (SUTURE) ×2 IMPLANT
SUT VICRYL 0 AB UR-6 (SUTURE) ×4 IMPLANT
SUT VLOC 90 2/L VL 12 GS22 (SUTURE) ×4 IMPLANT
SUTURE MNCRL 4-0 27XMF (SUTURE) ×1 IMPLANT
TROCAR 130MM GELPORT  DAV (MISCELLANEOUS) ×2 IMPLANT

## 2019-12-01 NOTE — Discharge Instructions (Addendum)
AMBULATORY SURGERY  °DISCHARGE INSTRUCTIONS ° ° °1) The drugs that you were given will stay in your system until tomorrow so for the next 24 hours you should not: ° °A) Drive an automobile °B) Make any legal decisions °C) Drink any alcoholic beverage ° ° °2) You may resume regular meals tomorrow.  Today it is better to start with liquids and gradually work up to solid foods. ° °You may eat anything you prefer, but it is better to start with liquids, then soup and crackers, and gradually work up to solid foods. ° ° °3) Please notify your doctor immediately if you have any unusual bleeding, trouble breathing, redness and pain at the surgery site, drainage, fever, or pain not relieved by medication. ° ° ° °4) Additional Instructions: ° ° ° ° ° ° ° °Please contact your physician with any problems or Same Day Surgery at [phone], Monday through Friday 6 am to 4 pm, or [facility] Main number at [phone].AMBULATORY SURGERY  °DISCHARGE INSTRUCTIONS ° ° °5) The drugs that you were given will stay in your system until tomorrow so for the next 24 hours you should not: ° °D) Drive an automobile °E) Make any legal decisions °F) Drink any alcoholic beverage ° ° °6) You may resume regular meals tomorrow.  Today it is better to start with liquids and gradually work up to solid foods. ° °You may eat anything you prefer, but it is better to start with liquids, then soup and crackers, and gradually work up to solid foods. ° ° °7) Please notify your doctor immediately if you have any unusual bleeding, trouble breathing, redness and pain at the surgery site, drainage, fever, or pain not relieved by medication. ° ° ° °8) Additional Instructions: ° ° ° ° ° ° ° °Please contact your physician with any problems or Same Day Surgery at [phone], Monday through Friday 6 am to 4 pm, or [facility] Main number at [phone]. °

## 2019-12-01 NOTE — Op Note (Signed)
Robotic assisted laparoscopic ventral hernia Repair IPOM using  Oval 6x4inches  ventralight BARD mesh   Pre-operative Diagnosis: ymptomatic ventral hernia   Post-operative Diagnosis: same   Surgeon: Caroleen Hamman, MD FACS   Anesthesia: Gen. with endotracheal tube    Findings: Ventral hernia ( 3 ms) and umbilical hernia (1 cm)   Estimated Blood Loss: 5cc       Complications: none             Procedure Details  The patient was seen again in the Holding Room. The benefits, complications, treatment options, and expected outcomes were discussed with the patient. The risks of bleeding, infection, recurrence of symptoms, failure to resolve symptoms, bowel injury, mesh placement, mesh infection, any of which could require further surgery were reviewed with the patient. The likelihood of improving the patient's symptoms with return to their baseline status is good.  The patient and/or family concurred with the proposed plan, giving informed consent.  The patient was taken to Operating Room, identified and the procedure verified.  A Time Out was held and the above information confirmed.   Prior to the induction of general anesthesia, antibiotic prophylaxis was administered. VTE prophylaxis was in place. General endotracheal anesthesia was then administered and tolerated well. After the induction, the abdomen was prepped with Chloraprep and draped in the sterile fashion. The patient was positioned in the supine position.   We used a left upper quadrant subcostal incision and using a cutdown technique were able to identify the fascia both anteriorly and posteriorly elevated incised and 2 stay sutures were placed.  Hassan trocar inserted and pneumoperitoneum obtained.  No hemodynamic compromise.   2 additional 8 mm ports were placed under direct visualization.  I visualized the hernia and there was an epigastric ventral hernia measuring approximately 3 cm.  At this time I went ahead and inserted the  Oval  mesh with an echo location ( 4x6inches).   The robot was brought to the surgical field and docked in the standard fashion.  We made sure that all instrumentation was kept under direct vision at all times and there was no collision between the arms.  I scrubbed out and went to the console.   I Confirmed and measured the defect.  .  Using a 0V Stratafix suture we closed the ventral defect primarily.  The PMI was used to pierce the defect in the center.  Using the mesh and the echo location device we were able to bring the mesh towards the abdominal wall. Falciform was taken down with electrocautery to allow adequate mesh placement.   The mesh was secured circumferentially to the abdominal wall using 2 OV lock in the standard fashion.   The mesh layed really nicely against the  abdominal wall. A second look laparoscopy revealed no evidence of intra-abdominal injury.    All the needles and foreign objects were removed under direct visualization.  The instruments were removed and the robot was undocked.  I scrubbed back in, The laparoscopic ports were removed under direct visualization and the pneumoperitoneum was deflated.   Incisions were closed with  4-0 Monocryl  And the fascial sutures approximated in the standard fashion Dermabond was used to coat the skin. Liposomal Marcaine was used to inject all the incision sites. Patient tolerated procedure well and there were no immediate complications. Needle and laparotomy counts were correct    Caroleen Hamman, MD, FACS

## 2019-12-01 NOTE — Anesthesia Preprocedure Evaluation (Addendum)
Anesthesia Evaluation  Patient identified by MRN, date of birth, ID band Patient awake    Reviewed: Allergy & Precautions, NPO status , Patient's Chart, lab work & pertinent test results  History of Anesthesia Complications Negative for: history of anesthetic complications  Airway Mallampati: III       Dental  (+) Edentulous Upper   Pulmonary sleep apnea (not using CPAP) , neg COPD, Not current smoker,           Cardiovascular hypertension, Pt. on medications and Pt. on home beta blockers + dysrhythmias (Palpatations)      Neuro/Psych neg Seizures Anxiety Depression    GI/Hepatic Neg liver ROS, GERD  Medicated and Controlled,  Endo/Other  neg diabetesMorbid obesity  Renal/GU Renal InsufficiencyRenal disease (s/p nephrectomy many years ago)     Musculoskeletal   Abdominal   Peds  Hematology   Anesthesia Other Findings   Reproductive/Obstetrics                             Anesthesia Physical Anesthesia Plan  ASA: III  Anesthesia Plan: General   Post-op Pain Management:    Induction: Intravenous  PONV Risk Score and Plan: 3 and Ondansetron, Dexamethasone and Treatment may vary due to age or medical condition  Airway Management Planned: Oral ETT  Additional Equipment:   Intra-op Plan:   Post-operative Plan:   Informed Consent: I have reviewed the patients History and Physical, chart, labs and discussed the procedure including the risks, benefits and alternatives for the proposed anesthesia with the patient or authorized representative who has indicated his/her understanding and acceptance.       Plan Discussed with:   Anesthesia Plan Comments:         Anesthesia Quick Evaluation

## 2019-12-01 NOTE — Transfer of Care (Signed)
Immediate Anesthesia Transfer of Care Note  Patient: Whitney [name]  Procedure(s) Performed: XI ROBOTIC ASSISTED VENTRAL HERNIA (N/A Abdomen)  Patient Location: PACU  Anesthesia Type:General  Level of Consciousness: awake  Airway & Oxygen Therapy: Patient connected to face mask oxygen  Post-op Assessment: Post -op Vital signs reviewed and stable  Post vital signs: stable  Last Vitals:  Vitals Value Taken Time  BP 148/84 12/01/19 1531  Temp 36.1 C 12/01/19 1531  Pulse 62 12/01/19 1531  Resp 18 12/01/19 1531  SpO2 100 % 12/01/19 1531  Vitals shown include unvalidated device data.  Last Pain:  Vitals:   12/01/19 1052  TempSrc: Temporal  PainSc: 6          Complications: No apparent anesthesia complications

## 2019-12-01 NOTE — Anesthesia Procedure Notes (Signed)
Procedure Name: Intubation Date/Time: 12/01/2019 1:37 PM Performed by: Aline Brochure, CRNA Pre-anesthesia Checklist: Patient identified, Emergency Drugs available, Suction available and Patient being monitored Patient Re-evaluated:Patient Re-evaluated prior to induction Oxygen Delivery Method: Circle system utilized Preoxygenation: Pre-oxygenation with 100% oxygen Induction Type: IV induction Ventilation: Mask ventilation without difficulty Laryngoscope Size: McGraph and 3 Grade View: Grade I Tube type: Oral Tube size: 7.0 mm Number of attempts: 1 Airway Equipment and Method: Stylet and Video-laryngoscopy Placement Confirmation: ETT inserted through vocal cords under direct vision,  positive ETCO2 and breath sounds checked- equal and bilateral Secured at: 21 cm Tube secured with: Tape Dental Injury: Teeth and Oropharynx as per pre-operative assessment

## 2019-12-01 NOTE — Interval H&P Note (Signed)
History and Physical Interval Note:  12/01/2019 12:59 PM  Whitney [name]  has presented today for surgery, with the diagnosis of Ventral hernia.  The various methods of treatment have been discussed with the patient and family. After consideration of risks, benefits and other options for treatment, the patient has consented to  Procedure(s): XI ROBOTIC [place] (N/A) as a surgical intervention.  The patient's history has been reviewed, patient examined, no change in status, stable for surgery.  I have reviewed the patient's chart and labs.  Questions were answered to the patient's satisfaction.     [place]

## 2019-12-01 NOTE — OR Nursing (Signed)
Patient has arrived to [facility] with elevated BP. Dr. Ronelle Nigh is aware of both bps' obtained here in [place]. He reports it is okay to proceed. Carvedilol given in [facility]. Patient hadn't taken meds as instructed at home today except for her prilosec.

## 2019-12-03 NOTE — Anesthesia Postprocedure Evaluation (Signed)
Anesthesia Post Note  Patient: Whitney [name]  Procedure(s) Performed: XI ROBOTIC ASSISTED VENTRAL HERNIA (N/A Abdomen)  Patient location during evaluation: PACU Anesthesia Type: General Level of consciousness: awake and alert and oriented Pain management: pain level controlled Vital Signs Assessment: post-procedure vital signs reviewed and stable Respiratory status: spontaneous breathing Cardiovascular status: blood pressure returned to baseline Anesthetic complications: no     Last Vitals:  Vitals:   12/01/19 1641 12/01/19 1723  BP: (!) 104/54 (!) 108/50  Pulse: (!) 57 70  Resp: 20   Temp: (!) 36.1 C   SpO2: 95%     Last Pain:  Vitals:   12/02/19 0758  TempSrc:   PainSc: 0-No pain                 Mister Krahenbuhl

## 2019-12-06 ENCOUNTER — Telehealth: Payer: Self-pay

## 2019-12-06 NOTE — Telephone Encounter (Signed)
Spoke with patient regarding ER visit, she was seen for shoulder pain and then had hernia SX so she is currently recovering from an already planned hernia repair and will call back once she needs referral for ortho and see [place] first. Whitney [name]

## 2019-12-14 ENCOUNTER — Other Ambulatory Visit: Payer: Self-pay

## 2019-12-14 ENCOUNTER — Ambulatory Visit (INDEPENDENT_AMBULATORY_CARE_PROVIDER_SITE_OTHER): Payer: Self-pay | Admitting: Surgery

## 2019-12-14 ENCOUNTER — Encounter: Payer: Self-pay | Admitting: Surgery

## 2019-12-14 VITALS — BP 166/101 | HR 76 | Temp 97.2°F | Ht 65.0 in | Wt 251.0 lb

## 2019-12-14 DIAGNOSIS — Z09 Encounter for follow-up examination after completed treatment for conditions other than malignant neoplasm: Secondary | ICD-10-CM

## 2019-12-14 MED ORDER — ONDANSETRON 4 MG PO TBDP: 4.0000 mg | ORAL_TABLET | Freq: Three times a day (TID) | ORAL | 0 refills | Status: DC | PRN

## 2019-12-14 NOTE — Patient Instructions (Addendum)
Pick up your medication at your local pharmacy.   See your appointment below. Call the office if you have any questions or concerns.  GENERAL POST-OPERATIVE PATIENT INSTRUCTIONS   WOUND CARE INSTRUCTIONS:  Keep a dry clean dressing on the wound if there is drainage. The initial bandage may be removed after 24 hours.  Once the wound has quit draining you may leave it open to air.  If clothing rubs against the wound or causes irritation and the wound is not draining you may cover it with a dry dressing during the daytime.  Try to keep the wound dry and avoid ointments on the wound unless directed to do so.  If the wound becomes bright red and painful or starts to drain infected material that is not clear, please contact your physician immediately.  If the wound is mildly pink and has a thick firm ridge underneath it, this is normal, and is referred to as a healing ridge.  This will resolve over the next 4-6 weeks.  BATHING: You may shower if you have been informed of this by your surgeon. However, Please do not submerge in a tub, hot tub, or pool until incisions are completely sealed or have been told by your surgeon that you may do so.  DIET:  You may eat any foods that you can tolerate.  It is a good idea to eat a high fiber diet and take in plenty of fluids to prevent constipation.  If you do become constipated you may want to take a mild laxative or take ducolax tablets on a daily basis until your bowel habits are regular.  Constipation can be very uncomfortable, along with straining, after recent surgery.  ACTIVITY:  You are encouraged to cough and deep breath or use your incentive spirometer if you were given one, every 15-30 minutes when awake.  This will help prevent respiratory complications and low grade fevers post-operatively if you had a general anesthetic.  You may want to hug a pillow when coughing and sneezing to add additional support to the surgical area, if you had abdominal or chest  surgery, which will decrease pain during these times.  You are encouraged to walk and engage in light activity for the next two weeks.  You should not lift more than 20 pounds for 6 weeks after surgery as it could put you at increased risk for complications.  Twenty pounds is roughly equivalent to a plastic bag of groceries. At that time- Listen to your body when lifting, if you have pain when lifting, stop and then try again in a few days. Soreness after doing exercises or activities of daily living is normal as you get back in to your normal routine.  MEDICATIONS:  Try to take narcotic medications and anti-inflammatory medications, such as tylenol, ibuprofen, naprosyn, etc., with food.  This will minimize stomach upset from the medication.  Should you develop nausea and vomiting from the pain medication, or develop a rash, please discontinue the medication and contact your physician.  You should not drive, make important decisions, or operate machinery when taking narcotic pain medication.  SUNBLOCK Use sun block to incision area over the next year if this area will be exposed to sun. This helps decrease scarring and will allow you avoid a permanent darkened area over your incision.  QUESTIONS:  Please feel free to call our office if you have any questions, and we will be glad to assist you.

## 2019-12-14 NOTE — Progress Notes (Signed)
Whitney [name] is 69 year old female status post robotic hernia repair.  She is doing well but she is still sore.  No fevers no chills she does report some nausea.  Sickle exam: No acute distress. Abdomen: Soft incisions healing well appropriate incisional tenderness.  No peritonitis there is a mild seroma within the periumbilical area.  No evidence of infection  A/P doing well after robotic ventral hernia.  Small seroma will likely resolve with time.  Will prescribe some Zofran as needed for nausea.  Return to clinic virtual visit in a couple weeks.

## 2019-12-28 ENCOUNTER — Telehealth: Payer: Self-pay

## 2019-12-28 NOTE — Telephone Encounter (Signed)
-----   Message from Rushie Chestnut, [place] sent at 09/28/2019  5:12 PM EST ----- Regarding: Pt is due for repeat Colonoscopy Pt is due for a repeat colonoscopy with a 2-day prep in May.

## 2019-12-28 NOTE — Telephone Encounter (Signed)
Patient primary number is disconnected and the other number was busy will try again later

## 2019-12-29 NOTE — Telephone Encounter (Signed)
Called and left a detail message to scheduled the procedure

## 2020-01-04 ENCOUNTER — Telehealth (INDEPENDENT_AMBULATORY_CARE_PROVIDER_SITE_OTHER): Payer: Self-pay | Admitting: Surgery

## 2020-01-04 ENCOUNTER — Other Ambulatory Visit: Payer: Self-pay

## 2020-01-04 DIAGNOSIS — Z09 Encounter for follow-up examination after completed treatment for conditions other than malignant neoplasm: Secondary | ICD-10-CM

## 2020-01-04 NOTE — Progress Notes (Signed)
I Called  patient on her cell phone.  She is doing much better some minimal discomfort.  He still feels a little seroma.  No fevers no chills she is tolerating diet.  She is ambulating. SHe was actually asleep when I call her but the sister woke her up. SHe has no concerns RTC prn

## 2020-01-19 DIAGNOSIS — N2581 Secondary hyperparathyroidism of renal origin: Secondary | ICD-10-CM | POA: Diagnosis not present

## 2020-01-19 DIAGNOSIS — N1831 Chronic kidney disease, stage 3a: Secondary | ICD-10-CM | POA: Diagnosis not present

## 2020-01-19 DIAGNOSIS — I10 Essential (primary) hypertension: Secondary | ICD-10-CM | POA: Diagnosis not present

## 2020-01-19 DIAGNOSIS — R809 Proteinuria, unspecified: Secondary | ICD-10-CM | POA: Diagnosis not present

## 2020-01-25 ENCOUNTER — Other Ambulatory Visit: Payer: Self-pay

## 2020-01-25 DIAGNOSIS — I10 Essential (primary) hypertension: Secondary | ICD-10-CM

## 2020-01-25 MED ORDER — AMLODIPINE BESYLATE 5 MG PO TABS: 5.0000 mg | ORAL_TABLET | Freq: Two times a day (BID) | ORAL | 2 refills | Status: DC

## 2020-01-25 MED ORDER — AMLODIPINE BESYLATE 5 MG PO TABS
5.0000 mg | ORAL_TABLET | Freq: Two times a day (BID) | ORAL | 2 refills | Status: DC
Start: 2020-01-25 — End: 2021-01-02

## 2020-02-07 ENCOUNTER — Ambulatory Visit: Payer: Medicare Other | Admitting: Nurse Practitioner

## 2020-02-08 ENCOUNTER — Telehealth: Payer: Self-pay

## 2020-02-08 NOTE — Telephone Encounter (Signed)
Confirmed and screened for 02-10-20 ov.

## 2020-02-10 ENCOUNTER — Ambulatory Visit: Payer: Medicare Other | Admitting: Nurse Practitioner

## 2020-02-15 ENCOUNTER — Ambulatory Visit (INDEPENDENT_AMBULATORY_CARE_PROVIDER_SITE_OTHER): Payer: Medicare Other | Admitting: Gastroenterology

## 2020-02-15 ENCOUNTER — Other Ambulatory Visit: Payer: Self-pay

## 2020-02-15 VITALS — BP 170/98 | HR 90 | Temp 97.9°F | Ht 65.0 in | Wt 245.0 lb

## 2020-02-15 DIAGNOSIS — K3189 Other diseases of stomach and duodenum: Secondary | ICD-10-CM | POA: Diagnosis not present

## 2020-02-15 DIAGNOSIS — R1013 Epigastric pain: Secondary | ICD-10-CM

## 2020-02-15 DIAGNOSIS — A048 Other specified bacterial intestinal infections: Secondary | ICD-10-CM

## 2020-02-15 DIAGNOSIS — K635 Polyp of colon: Secondary | ICD-10-CM

## 2020-02-15 DIAGNOSIS — K31A0 Gastric intestinal metaplasia, unspecified: Secondary | ICD-10-CM

## 2020-02-15 NOTE — Progress Notes (Signed)
[name] MD, MRCP(U.K) [address]  [place]  [place], [place] [address]  Main: [phone]  Fax: [phone]   Primary Care Physician: Ronnell Freshwater, NP  Primary Gastroenterologist:  Dr. [name]   Dyspepsia follow-up  HPI: Whitney [name] is a 69 y.o. female   Follow-up H. pylori gastritis ,abnormal imaging of the pancreas, lesion of the GE junction HPI: Whitney [name] is a 69 y.o. female   Summary of history :  Initially seen and referred on 3/31/2020for abdominal pain.  Started after cholecystectomy  Abdominal CT scan in March 2019 showed  A small low-attenuation area within the body of the pancreas.  MRI of the abdomen in May 2019 which demonstrated benign liver abnormalities including multiple hemangiomas and cysts. Gallstone.'s multiple small cystic foci throughout the pancreas. Findings may represent small pseudocysts or areas of sidebranch duct ectasia. Cystic neoplasm of the pancreas not excluded follow-up imaging in 24 months recommended.  10/19/2018 HIDA scan: Normal. 02/18/2019: H pylori breath test - negative 03/04/2019: MRCP- progression of the gall bladder polyp which is 10 mm in size . Pancreas lesion is stable repeat in 1 year . 06/13/2019 colonoscopy: Large quantity of stool seen in the rectum and sigmoid repeat colonoscopy recommended.  EGD. Medium size hiatal hernia seen nodule seen just past the GE junction. No biopsies taken was to undergo an EUS with Dr. Owens Loffler but the patient canceled the procedure. Biopsies of the stomach demonstrated H. pylori gastritis.  Underwent laparoscopic cholecystectomy with Dr. Bethanie Dicker November 2020 which resolved her longstanding abdominal pain  05/27/2019: CT scan of the abdomen and pelvis showed moderate supraumbilical midline ventral hernia containing inflamed flat and small amount of fluid. Stable small pancreatic cysts.   09/19/2019: Colonoscopy: Poor prep and 3 polyps 4 to 6 mm were  resected in the ascending colon with a cold snare.  Could not be retrieved since prep was poor     Interval history2/23/2021-02/15/2020   10/31/2019: EUS: Nodule at the GE junction was resected it was benign.  Biopsies of stomach showed chronic gastritis with focal intestinal metaplasia.  Cystic lesions of the pancreas were noted suggestive of IPMN.  12/01/2019: Ventral hernia repair.   Presently no abdominal pain.  No acute complaints.    Current Outpatient Medications  Medication Sig Dispense Refill  . amLODipine (NORVASC) 5 MG tablet Take 1 tablet (5 mg total) by mouth 2 (two) times daily. 60 tablet 2  . aspirin EC 81 MG tablet Take 81 mg by mouth daily.    Marland Kitchen atorvastatin (LIPITOR) 40 MG tablet Take 1 tablet (40 mg total) by mouth daily at 6 PM. (Patient taking differently: Take 40 mg by mouth at bedtime. ) 90 tablet 1  . carvedilol (COREG) 3.125 MG tablet TAKE 2 TABLETS(6.25 MG) BY MOUTH TWICE DAILY WITH A MEAL (Patient taking differently: Take 6.25 mg by mouth in the morning and at bedtime. ) 120 tablet 3  . Cholecalciferol (VITAMIN D) 50 MCG (2000 UT) tablet Take 2,000 Units by mouth in the morning and at bedtime.     . diclofenac Sodium (VOLTAREN) 1 % GEL Apply 1 application topically 4 (four) times daily as needed (pain).     . flecainide (TAMBOCOR) 100 MG tablet Take 100 mg by mouth 2 (two) times daily as needed (palpitations).     . gabapentin (NEURONTIN) 100 MG capsule Take 1 capsule (100 mg total) by mouth 2 (two) times daily. (Patient taking differently: Take 100 mg by mouth  2 (two) times daily as needed (pain). ) 90 capsule 0  . hydrALAZINE (APRESOLINE) 25 MG tablet Take 1 tablet (25 mg total) by mouth 3 (three) times daily. 90 tablet 3  . HYDROcodone-acetaminophen (NORCO/VICODIN) 5-325 MG tablet Take 1-2 tablets by mouth every 6 (six) hours as needed for moderate pain. 30 tablet 0  . meclizine (ANTIVERT) 25 MG tablet Take 1 tablet (25 mg total) by mouth 2 (two) times  daily as needed for dizziness. 20 tablet 0  . omeprazole (PRILOSEC) 40 MG capsule Take 1 capsule (40 mg total) by mouth 2 (two) times daily before a meal. For 1 month.  Then decrease to once daily dosing thereafter.  Please take before meals. 60 capsule 3  . ondansetron (ZOFRAN-ODT) 4 MG disintegrating tablet Take 1 tablet (4 mg total) by mouth every 8 (eight) hours as needed for nausea or vomiting. 20 tablet 0  . orlistat (ALLI) 60 MG capsule Take 60 mg by mouth 3 (three) times daily with meals.    Marland Kitchen OVER THE COUNTER MEDICATION Apply 1 application topically in the morning and at bedtime. Tummy cream    . rOPINIRole (REQUIP) 0.5 MG tablet Takes 1 or 2 tablets daily as needed (Patient taking differently: Take 0.5 mg by mouth 2 (two) times daily as needed (restless legs). ) 90 tablet 1  . traMADol (ULTRAM) 50 MG tablet Take 1 tablet (50 mg total) by mouth every 12 (twelve) hours as needed. 20 tablet 0   No current facility-administered medications for this visit.    Allergies as of 02/15/2020 - Review Complete 12/14/2019  Allergen Reaction Noted  . Enalapril Swelling   . Lisinopril Swelling 05/26/2016    ROS:  General: Negative for anorexia, weight loss, fever, chills, fatigue, weakness. ENT: Negative for hoarseness, difficulty swallowing , nasal congestion. CV: Negative for chest pain, angina, palpitations, dyspnea on exertion, peripheral edema.  Respiratory: Negative for dyspnea at rest, dyspnea on exertion, cough, sputum, wheezing.  GI: See history of present illness. GU:  Negative for dysuria, hematuria, urinary incontinence, urinary frequency, nocturnal urination.  Endo: Negative for unusual weight change.    Physical Examination:   LMP 01/26/2017 Comment: age 36  General: Well-nourished, well-developed in no acute distress.  Eyes: No icterus. Conjunctivae pink. Mouth: Oropharyngeal mucosa moist and pink , no lesions erythema or exudate. Lungs: Clear to auscultation bilaterally.  Non-labored. Heart: Regular rate and rhythm, no murmurs rubs or gallops.  Abdomen: Bowel sounds are normal, nontender, nondistended, no hepatosplenomegaly or masses, no abdominal bruits or hernia , no rebound or guarding.   Extremities: No lower extremity edema. No clubbing or deformities. Neuro: Alert and oriented x 3.  Grossly intact. Skin: Warm and dry, no jaundice.   Psych: Alert and cooperative, normal mood and affect.   Imaging Studies: No results found.  Assessment and Plan:   VERNICE MANNINA is a 69 y.o. y/o female here to follow upfor abdominal pain.  Resolved after cholecystectomy when gallbladder polyps was seen on imaging. Previous abdominal imaging showed abnormalities of the pancreas and plan to repeat imaging in a year.  H. pylori gastritis seen on EGD along with gastric intestinal metaplasia..  Plan :  1.Repeat colonoscopy due to poor prep  an EGD to evaluate intestinal metaplasia for gastric mapping in 4 to 5  2. MRI pancreas can be postponed to November 2021 as recent EUS showed no worrisome signs. 3.H. pylori gastritis: Status post treatment check H. pylori breath test for eradication .  Ordered back  in February but not obtained will obtain it today.   I have discussed alternative options, risks & benefits,  which include, but are not limited to, bleeding, infection, perforation,respiratory complication & drug reaction.  The patient agrees with this plan & written consent will be obtained.    Dr [name]  MD,MRCP [facility]) Follow up in 6 months

## 2020-02-17 ENCOUNTER — Encounter: Payer: Self-pay | Admitting: Gastroenterology

## 2020-02-17 LAB — H. PYLORI BREATH TEST: H pylori Breath Test: NEGATIVE

## 2020-02-23 ENCOUNTER — Ambulatory Visit: Payer: Medicare Other | Admitting: Nurse Practitioner

## 2020-02-27 DIAGNOSIS — I10 Essential (primary) hypertension: Secondary | ICD-10-CM | POA: Diagnosis not present

## 2020-02-27 DIAGNOSIS — I493 Ventricular premature depolarization: Secondary | ICD-10-CM | POA: Diagnosis not present

## 2020-02-27 DIAGNOSIS — G4733 Obstructive sleep apnea (adult) (pediatric): Secondary | ICD-10-CM | POA: Diagnosis not present

## 2020-02-27 DIAGNOSIS — N1832 Chronic kidney disease, stage 3b: Secondary | ICD-10-CM | POA: Diagnosis not present

## 2020-02-28 DIAGNOSIS — Z96652 Presence of left artificial knee joint: Secondary | ICD-10-CM | POA: Diagnosis not present

## 2020-02-28 DIAGNOSIS — M1711 Unilateral primary osteoarthritis, right knee: Secondary | ICD-10-CM | POA: Diagnosis not present

## 2020-03-02 ENCOUNTER — Telehealth: Payer: Self-pay

## 2020-03-02 NOTE — Telephone Encounter (Signed)
Confirmed and screened for 03-06-20 ov. 

## 2020-03-06 ENCOUNTER — Other Ambulatory Visit: Payer: Self-pay

## 2020-03-06 ENCOUNTER — Encounter: Payer: Self-pay | Admitting: Nurse Practitioner

## 2020-03-06 ENCOUNTER — Ambulatory Visit (INDEPENDENT_AMBULATORY_CARE_PROVIDER_SITE_OTHER): Payer: Medicare Other | Admitting: Hospice and Palliative Medicine

## 2020-03-06 DIAGNOSIS — I10 Essential (primary) hypertension: Secondary | ICD-10-CM | POA: Diagnosis not present

## 2020-03-06 DIAGNOSIS — G4700 Insomnia, unspecified: Secondary | ICD-10-CM

## 2020-03-06 DIAGNOSIS — E782 Mixed hyperlipidemia: Secondary | ICD-10-CM | POA: Diagnosis not present

## 2020-03-06 MED ORDER — TRAZODONE HCL 50 MG PO TABS: 25.0000 mg | ORAL_TABLET | Freq: Every evening | ORAL | 1 refills | Status: DC | PRN

## 2020-03-06 NOTE — Progress Notes (Signed)
[facility] [place], [place] [address]  Internal MEDICINE  Office Visit Note  Patient Name: Whitney [name]  [id number]  [id number]  Date of Service: 03/10/2020  Chief Complaint  Patient presents with  . Follow-up  . Depression  . Hypertension  . Quality Metric Gaps    TDAP    HPI Patient is here today for routine follow-up on chronic medical conditions.  BP slightly elevated today. Followed closely by nephrology as well as cardiology. She states that at recent follow-ups with them her BP has been stable. Planning on having R knee replacement later this year. Needs mamogram, needs new order Discussed elevated cholesterol levels. Discussed importance of diet and exercise. Currently taking Atorvastatin 40 mg nightly. Mentions that in the past she was taking Trazodone nightly to help with sleeping. Says she has been without prescription for some time and her sleep is minimal. Says she routinely only sleeps 3-4 hours nightly. Most difficulty with falling asleep at night.  Repeat colopnoscopy scheduled for October   Current Medication: Outpatient Encounter Medications as of 03/06/2020  Medication Sig  . amLODipine (NORVASC) 5 MG tablet Take 1 tablet (5 mg total) by mouth 2 (two) times daily.  Marland Kitchen aspirin EC 81 MG tablet Take 81 mg by mouth daily.  Marland Kitchen atorvastatin (LIPITOR) 40 MG tablet Take 1 tablet (40 mg total) by mouth daily at 6 PM. (Patient taking differently: Take 40 mg by mouth at bedtime. )  . carvedilol (COREG) 3.125 MG tablet TAKE 2 TABLETS(6.25 MG) BY MOUTH TWICE DAILY WITH A MEAL (Patient taking differently: Take 6.25 mg by mouth in the morning and at bedtime. )  . Cholecalciferol (VITAMIN D) 50 MCG (2000 UT) tablet Take 2,000 Units by mouth in the morning and at bedtime.   . diclofenac Sodium (VOLTAREN) 1 % GEL Apply 1 application topically 4 (four) times daily as needed (pain).   . flecainide (TAMBOCOR) 100 MG tablet Take 100 mg by mouth 2 (two) times  daily as needed (palpitations).   . gabapentin (NEURONTIN) 100 MG capsule Take 1 capsule (100 mg total) by mouth 2 (two) times daily. (Patient taking differently: Take 100 mg by mouth 2 (two) times daily as needed (pain). )  . hydrALAZINE (APRESOLINE) 25 MG tablet Take 1 tablet (25 mg total) by mouth 3 (three) times daily.  Marland Kitchen HYDROcodone-acetaminophen (NORCO/VICODIN) 5-325 MG tablet Take 1-2 tablets by mouth every 6 (six) hours as needed for moderate pain.  . meclizine (ANTIVERT) 25 MG tablet Take 1 tablet (25 mg total) by mouth 2 (two) times daily as needed for dizziness.  . ondansetron (ZOFRAN-ODT) 4 MG disintegrating tablet Take 1 tablet (4 mg total) by mouth every 8 (eight) hours as needed for nausea or vomiting.  Marland Kitchen orlistat (ALLI) 60 MG capsule Take 60 mg by mouth 3 (three) times daily with meals.  Marland Kitchen OVER THE COUNTER MEDICATION Apply 1 application topically in the morning and at bedtime. Tummy cream  . rOPINIRole (REQUIP) 0.5 MG tablet Takes 1 or 2 tablets daily as needed (Patient taking differently: Take 0.5 mg by mouth 2 (two) times daily as needed (restless legs). )  . traMADol (ULTRAM) 50 MG tablet Take 1 tablet (50 mg total) by mouth every 12 (twelve) hours as needed.  Marland Kitchen omeprazole (PRILOSEC) 40 MG capsule Take 1 capsule (40 mg total) by mouth 2 (two) times daily before a meal. For 1 month.  Then decrease to once daily dosing thereafter.  Please take before meals.  Marland Kitchen  traZODone (DESYREL) 50 MG tablet Take 0.5 tablets (25 mg total) by mouth at bedtime as needed for sleep.   No facility-administered encounter medications on file as of 03/06/2020.    Surgical History: Past Surgical History:  Procedure Laterality Date  . BIOPSY  10/31/2019   Procedure: BIOPSY;  Surgeon: Rush Landmark Telford Nab., MD;  Location: [place];  Service: Gastroenterology;;  . CARDIAC CATHETERIZATION    . CHOLECYSTECTOMY    . COLONOSCOPY WITH PROPOFOL N/A 06/13/2019   Procedure: COLONOSCOPY WITH PROPOFOL;   Surgeon: [name], MD;  Location: [facility] ENDOSCOPY;  Service: Gastroenterology;  Laterality: N/A;  . COLONOSCOPY WITH PROPOFOL N/A 08/12/2019   Procedure: COLONOSCOPY WITH PROPOFOL;  Surgeon: [name], MD;  Location: [facility] ENDOSCOPY;  Service: Gastroenterology;  Laterality: N/A;  . COLONOSCOPY WITH PROPOFOL N/A 09/19/2019   Procedure: COLONOSCOPY WITH PROPOFOL;  Surgeon: [name], MD;  Location: [facility]COPY;  Service: Gastroenterology;  Laterality: N/A;  . DILATION AND CURETTAGE OF UTERUS    . ESOPHAGOGASTRODUODENOSCOPY (EGD) WITH PROPOFOL N/A 06/13/2019   Procedure: ESOPHAGOGASTRODUODENOSCOPY (EGD) WITH PROPOFOL;  Surgeon: [name], MD;  Location: [facility] ENDOSCOPY;  Service: Gastroenterology;  Laterality: N/A;  Pt will go for COVID test on [id number] as she uses public transportation and will be in the area on that day.   . ESOPHAGOGASTRODUODENOSCOPY (EGD) WITH PROPOFOL N/A 10/31/2019   Procedure: ESOPHAGOGASTRODUODENOSCOPY (EGD) WITH PROPOFOL;  Surgeon: Rush Landmark Telford Nab., MD;  Location: [place];  Service: Gastroenterology;  Laterality: N/A;  . HEMOSTASIS CLIP PLACEMENT  10/31/2019   Procedure: HEMOSTASIS CLIP PLACEMENT;  Surgeon: Irving Copas., MD;  Location: [place];  Service: Gastroenterology;;  . HYSTEROSCOPY WITH D & C N/A 02/16/2017   Procedure: DILATATION AND CURETTAGE [place];  Surgeon: Brayton Mars, MD;  Location: [facility];  Service: Gynecology;  Laterality: N/A;  . JOINT REPLACEMENT    . kidney removal Left   . KIDNEY SURGERY Left 1979   left kidney removed  . LEFT HEART CATH AND CORONARY ANGIOGRAPHY N/A 10/21/2017   Procedure: LEFT HEART CATH AND CORONARY ANGIOGRAPHY;  Surgeon: Teodoro Spray, MD;  Location: [place] [facility];  Service: Cardiovascular;  Laterality: N/A;  . POLYPECTOMY  10/31/2019   Procedure: POLYPECTOMY;  Surgeon: Rush Landmark Telford Nab., MD;  Location: [place];  Service: Gastroenterology;;  . TOTAL KNEE ARTHROPLASTY  Left 07/06/2017   Procedure: TOTAL KNEE ARTHROPLASTY;  Surgeon: Lovell Sheehan, MD;  Location: [facility];  Service: Orthopedics;  Laterality: Left;  . UPPER ESOPHAGEAL ENDOSCOPIC ULTRASOUND (EUS) N/A 10/31/2019   Procedure: UPPER ESOPHAGEAL ENDOSCOPIC ULTRASOUND (EUS);  Surgeon: Irving Copas., MD;  Location: [place];  Service: Gastroenterology;  Laterality: N/A;  . WRIST ARTHROSCOPY Left 1970s  . XI ROBOTIC ASSISTED VENTRAL HERNIA N/A 12/01/2019   Procedure: XI ROBOTIC ASSISTED VENTRAL HERNIA;  Surgeon: Jules Husbands, MD;  Location: [facility];  Service: General;  Laterality: N/A;    Medical History: Past Medical History:  Diagnosis Date  . Abnormal Pap smear of cervix    Pt states she had colposcopy   . Anxiety   . Arthritis   . Chronic kidney disease    removed left kidney  . Depression   . Dyspnea   . Dysrhythmia   . Hx of unilateral nephrectomy 1979   Left Nephrectomy  . Hypertension   . Lower extremity edema   . Palpitations   . PMB (postmenopausal bleeding)   . Restless leg syndrome   . Sleep apnea    has no  cpap  . Vitamin D deficiency     Family History: Family History  Problem Relation Age of Onset  . Ovarian cancer Mother   . Hypertension Father   . Diabetes Father   . Cancer Father   . Breast cancer Sister   . Diabetes Sister     Social History   Socioeconomic History  . Marital status: Divorced    Spouse name: Not on file  . Number of children: Not on file  . Years of education: Not on file  . Highest education level: Not on file  Occupational History  . Not on file  Tobacco Use  . Smoking status: Never Smoker  . Smokeless tobacco: Never Used  Vaping Use  . Vaping Use: Never used  Substance and Sexual Activity  . Alcohol use: Yes    Alcohol/week: 2.0 standard drinks    Types: 2 Standard drinks or equivalent per week    Comment: occasionally  . Drug use: Yes    Frequency: 3.0 times per week    Types: Marijuana, Cocaine     Comment: Pt states she uses for leg pain: marijuana; states no longer uses cocaine  . Sexual activity: Yes    Birth control/protection: None  Other Topics Concern  . Not on file  Social History Narrative  . Not on file   Social Determinants of Health   Financial Resource Strain:   . Difficulty of Paying Living Expenses:   Food Insecurity:   . Worried About Charity fundraiser in the Last Year:   . Arboriculturist in the Last Year:   Transportation Needs:   . Film/video editor (Medical):   Marland Kitchen Lack of Transportation (Non-Medical):   Physical Activity:   . Days of Exercise per Week:   . Minutes of Exercise per Session:   Stress:   . Feeling of Stress :   Social Connections:   . Frequency of Communication with Friends and Family:   . Frequency of Social Gatherings with Friends and Family:   . Attends Religious Services:   . Active Member of Clubs or Organizations:   . Attends Archivist Meetings:   Marland Kitchen Marital Status:   Intimate Partner Violence:   . Fear of Current or Ex-Partner:   . Emotionally Abused:   Marland Kitchen Physically Abused:   . Sexually Abused:    Review of Systems  Constitutional: Positive for fatigue.       Negative for chills, fever.  HENT: Negative.        For sinus pain, sinus pressure, sore throat, trouble swallowing.  Eyes: Negative.        For changes in vision or visual disturbances.  Respiratory: Negative.        For chest tightness, cough, shortness of breath, wheezing.  Cardiovascular: Negative.        For chest pain, ankle swelling, palpitations.  Gastrointestinal: Negative.        For abdominal pain, constipation, nausea, vomiting, diarrhea.  Endocrine: Negative.        For polydipsia, polyphagia, polyuria.  Genitourinary: Negative.        For dysuria, flank pain, hematuria, increased frequency, urgency.  Musculoskeletal: Positive for arthralgias.  Skin: Negative.        For rash, wound.  Allergic/Immunologic: Negative.   Neurological:  Negative.        For dizziness, headaches, tremors, weakness.  Hematological: Negative.   Psychiatric/Behavioral: Negative.        For confusion, depression,  anxiety, sleep disturbances.    Vital Signs: BP (!) 146/70   Pulse 76   Temp 97.6 F (36.4 C)   Resp 16   Ht 5\' 5"  (1.651 m)   Wt 246 lb 6.4 oz (111.8 kg)   LMP 01/26/2017 Comment: age 69  SpO2 96%   BMI 41.00 kg/m    Physical Exam Constitutional:      Appearance: Normal appearance. She is obese.  HENT:     Mouth/Throat:     Mouth: Mucous membranes are moist.     Pharynx: Oropharynx is clear.  Cardiovascular:     Rate and Rhythm: Normal rate.     Pulses: Normal pulses.     Heart sounds: Murmur heard.   Pulmonary:     Effort: Pulmonary effort is normal.     Breath sounds: Normal breath sounds.  Abdominal:     General: Abdomen is flat.     Palpations: Abdomen is soft.     Tenderness: There is abdominal tenderness.  Musculoskeletal:     Cervical back: Normal range of motion.     Right knee: Decreased range of motion. Tenderness present.     Left knee: Decreased range of motion.  Skin:    General: Skin is warm and dry.  Neurological:     General: No focal deficit present.     Mental Status: She is alert and oriented to person, place, and time. Mental status is at baseline.  Psychiatric:        Mood and Affect: Mood normal.        Thought Content: Thought content normal.     Assessment/Plan: 1. Essential hypertension Continue with current therapy. Has improved with increased dose of amlodipine. Continue to follow-up with nephrology as well as cardiology. May need to continue to adjust medications if BP is elevated. Carotid US for longstanding HTN. - US Carotid Bilateral; Future  2. Mixed hyperlipidemia Continue with atorvastatin. Discussed importance of incorporating diet and exercise into her daily routine to assist with HLD. Carotid US for longstanding HLD. - US Carotid Bilateral; Future  3. Insomnia,  unspecified type Has taken trazodone in the past and was used to help with her sleep. Will start with 25 mg may need to increased to 50 mg if needed. - traZODone (DESYREL) 50 MG tablet; Take 0.5 tablets (25 mg total) by mouth at bedtime as needed for sleep.  Dispense: 30 tablet; Refill: 1  4. Morbid obesity ([place]) Discussed the importance of weight loss and the negative effects obesity has on her BP as well as her cholesterol levels.  General Counseling: Whitney [name] understanding of the findings of todays visit and agrees with plan of treatment. I have discussed any further diagnostic evaluation that may be needed or ordered today. We also reviewed her medications today. she has been encouraged to call the office with any questions or concerns that should arise related to todays visit.  Hypertension Counseling:   The following hypertensive lifestyle modification were recommended and discussed:  1. Limiting alcohol intake to less than 1 oz/day of ethanol:(24 oz of beer or 8 oz of wine or 2 oz of 100-proof whiskey). 2. Take baby ASA 81 mg daily. 3. Importance of regular aerobic exercise and losing weight. 4. Reduce dietary saturated fat and cholesterol intake for overall cardiovascular health. 5. Maintaining adequate dietary potassium, calcium, and magnesium intake. 6. Regular monitoring of the blood pressure. 7. Reduce sodium intake to less than 100 mmol/day (less than 2.3 gm of sodium or  less than 6 gm of sodium choride)   Orders Placed This Encounter  Procedures  . US Carotid Bilateral    Meds ordered this encounter  Medications  . traZODone (DESYREL) 50 MG tablet    Sig: Take 0.5 tablets (25 mg total) by mouth at bedtime as needed for sleep.    Dispense:  30 tablet    Refill:  1    Total time spent: 20 Minutes  This patient was seen by Theodoro Grist, AGNP-C in collaboration with Dr. Lavera Guise as part of a collaborative care agreement.  Time spent includes review of  chart, medications, test results, and follow up plan with the patient.   Theodoro Grist, AGNP-C  Dr Lavera Guise Internal medicine

## 2020-03-16 ENCOUNTER — Other Ambulatory Visit: Payer: Medicare Other

## 2020-04-04 ENCOUNTER — Other Ambulatory Visit: Payer: Self-pay

## 2020-04-04 ENCOUNTER — Ambulatory Visit: Payer: Medicare Other | Attending: Orthopedic Surgery

## 2020-04-04 DIAGNOSIS — G8929 Other chronic pain: Secondary | ICD-10-CM | POA: Insufficient documentation

## 2020-04-04 DIAGNOSIS — M25561 Pain in right knee: Secondary | ICD-10-CM | POA: Insufficient documentation

## 2020-04-04 DIAGNOSIS — M6281 Muscle weakness (generalized): Secondary | ICD-10-CM

## 2020-04-04 DIAGNOSIS — M25552 Pain in left hip: Secondary | ICD-10-CM | POA: Diagnosis not present

## 2020-04-04 NOTE — Therapy (Addendum)
[facility] [address], [place], [address] Phone: [phone]   Fax:  [phone]  Physical Therapy Evaluation  Patient Details  Name: Whitney [name] MRN: [id number] Date of Birth: 1951/03/02 Referring Provider (PT): Kurtis Bushman MD   Encounter Date: 04/04/2020   PT End of Session - 04/04/20 1347    Visit Number 1    Number of Visits 13    Date for PT Re-Evaluation 05/16/20    Authorization Type 1/10    PT Start Time 1300    PT Stop Time 1400    PT Time Calculation (min) 60 min    Activity Tolerance Patient tolerated treatment well    Behavior During Therapy [facility] for tasks assessed/performed           Past Medical History:  Diagnosis Date  . Abnormal Pap smear of cervix    Pt states she had colposcopy   . Anxiety   . Arthritis   . Chronic kidney disease    removed left kidney  . Depression   . Dyspnea   . Dysrhythmia   . Hx of unilateral nephrectomy 1979   Left Nephrectomy  . Hypertension   . Lower extremity edema   . Palpitations   . PMB (postmenopausal bleeding)   . Restless leg syndrome   . Sleep apnea    has no cpap  . Vitamin D deficiency     Past Surgical History:  Procedure Laterality Date  . BIOPSY  10/31/2019   Procedure: BIOPSY;  Surgeon: Rush Landmark Telford Nab., MD;  Location: [place];  Service: Gastroenterology;;  . CARDIAC CATHETERIZATION    . CHOLECYSTECTOMY    . COLONOSCOPY WITH PROPOFOL N/A 06/13/2019   Procedure: COLONOSCOPY WITH PROPOFOL;  Surgeon: [name], MD;  Location: [facility] ENDOSCOPY;  Service: Gastroenterology;  Laterality: N/A;  . COLONOSCOPY WITH PROPOFOL N/A 08/12/2019   Procedure: COLONOSCOPY WITH PROPOFOL;  Surgeon: [name], MD;  Location: [facility] ENDOSCOPY;  Service: Gastroenterology;  Laterality: N/A;  . COLONOSCOPY WITH PROPOFOL N/A 09/19/2019   Procedure: COLONOSCOPY WITH PROPOFOL;  Surgeon: [name], MD;  Location: [facility] ENDOSCOPY;  Service:  Gastroenterology;  Laterality: N/A;  . DILATION AND CURETTAGE OF UTERUS    . ESOPHAGOGASTRODUODENOSCOPY (EGD) WITH PROPOFOL N/A 06/13/2019   Procedure: ESOPHAGOGASTRODUODENOSCOPY (EGD) WITH PROPOFOL;  Surgeon: [name], MD;  Location: [facility] ENDOSCOPY;  Service: Gastroenterology;  Laterality: N/A;  Pt will go for COVID test on 24-8-25 as she uses public transportation and will be in the area on that day.   . ESOPHAGOGASTRODUODENOSCOPY (EGD) WITH PROPOFOL N/A 10/31/2019   Procedure: ESOPHAGOGASTRODUODENOSCOPY (EGD) WITH PROPOFOL;  Surgeon: Rush Landmark Telford Nab., MD;  Location: [place];  Service: Gastroenterology;  Laterality: N/A;  . HEMOSTASIS CLIP PLACEMENT  10/31/2019   Procedure: HEMOSTASIS CLIP PLACEMENT;  Surgeon: Irving Copas., MD;  Location: [place];  Service: Gastroenterology;;  . HYSTEROSCOPY WITH D & C N/A 02/16/2017   Procedure: DILATATION AND CURETTAGE [place];  Surgeon: Brayton Mars, MD;  Location: [facility];  Service: Gynecology;  Laterality: N/A;  . JOINT REPLACEMENT    . kidney removal Left   . KIDNEY SURGERY Left 1979   left kidney removed  . LEFT HEART CATH AND CORONARY ANGIOGRAPHY N/A 10/21/2017   Procedure: LEFT HEART CATH AND CORONARY ANGIOGRAPHY;  Surgeon: Teodoro Spray, MD;  Location: [place] CV LAB;  Service: Cardiovascular;  Laterality: N/A;  . POLYPECTOMY  10/31/2019   Procedure: POLYPECTOMY;  Surgeon: Justice Britain  Brooke Bonito., MD;  Location: [place];  Service: Gastroenterology;;  . TOTAL KNEE ARTHROPLASTY Left 07/06/2017   Procedure: TOTAL KNEE ARTHROPLASTY;  Surgeon: Lovell Sheehan, MD;  Location: [facility];  Service: Orthopedics;  Laterality: Left;  . UPPER ESOPHAGEAL ENDOSCOPIC ULTRASOUND (EUS) N/A 10/31/2019   Procedure: UPPER ESOPHAGEAL ENDOSCOPIC ULTRASOUND (EUS);  Surgeon: Irving Copas., MD;  Location: [place];  Service: Gastroenterology;  Laterality: N/A;  . WRIST ARTHROSCOPY Left 1970s  . XI ROBOTIC  ASSISTED VENTRAL HERNIA N/A 12/01/2019   Procedure: XI ROBOTIC ASSISTED VENTRAL HERNIA;  Surgeon: Jules Husbands, MD;  Location: [facility];  Service: General;  Laterality: N/A;    There were no vitals filed for this visit.    Subjective Assessment - 04/04/20 1344    Subjective Patient is a 69 y/o female with a diagnosis of right knee OA with a c/c of right knee pain along with pain in bilateral hips.  Patient reports that the worst pain is 10/10 NPS and is pain free when she is resting or after taking prescribed medications.  Patient's knee pain begins after standing and walking for around 2-3 hours and she tends to have episodic feelings of her knee buckling during this period. Patient also states that she has pain when using stairs.  Patient uses a SPC out in the community due to hesitancy of her knee buckling. Patient's goals for therapy are to be able to walk with less/no pain and to be able to jog so that she can exercise.    Pertinent History s/p TKA 07/06/2017; right knee arthritis with weakness as well with reports of right and left leg "giving way" intermittently; Going to have TKA 6/10    Limitations Walking;Standing;House hold activities;Other (comment)    How long can you sit comfortably? No Limit    How long can you stand comfortably? 2-3 hours    How long can you walk comfortably? 2-3 hours    Diagnostic tests --    Patient Stated Goals Improve motion and strength of  hips and R knee and being able to walk with less/no pain.    Currently in Pain? No/denies    Pain Onset More than a month ago              [facility] PT Assessment - 04/04/20 0001      Assessment   Medical Diagnosis R Knee OA    Referring Provider (PT) Kurtis Bushman MD    Hand Dominance Right    Prior [place] residence      Prior Function   Level of Independence Independent    Vocation Retired      Associate Professor   Overall Cognitive Status Within Functional  Limits for tasks assessed      Functional Tests   Functional tests Step down;Sit to Stand      Step Down   Comments pain with descending stairs      ROM / Strength   AROM / PROM / Strength AROM;Strength      AROM   AROM Assessment Site Hip;Knee    Right/Left Hip Right;Left    Right Hip Extension --   WNL   Right Hip Flexion --   WNL   Left Hip Extension --   WNL   Left Hip Flexion --   WNL   Right/Left Knee Right;Left    Right Knee Extension 0    Right Knee Flexion 100  Left Knee Extension 0    Left Knee Flexion 116      Strength   Strength Assessment Site Hip;Knee    Right/Left Hip Right;Left    Right Hip Flexion 4-/5    Right Hip External Rotation  4/5    Right Hip Internal Rotation 4/5    Right Hip ABduction 4-/5   with pain   Left Hip Flexion 4-/5    Left Hip External Rotation 4/5    Left Hip Internal Rotation 4/5    Left Hip ABduction 4-/5   with pain   Right Knee Flexion 4-/5    Right Knee Extension 4/5   with pain   Left Knee Flexion 4-/5    Left Knee Extension 4/5      Palpation   Patella mobility --   decreased medial to lateral with pain      Transfers   Five time sit to stand comments  12sec      Ambulation/Gait   Stairs Yes    Stairs Assistance 7: Independent    Stair Management Technique Two rails;Alternating pattern    Number of Stairs 4    Gait Comments corrected to come down straight. Patient was descending stairs sideways      Standardized Balance Assessment   10 Meter Walk 1.67m/s            Therapeutic Exercise - Standing Hip Abduction x10 B - Standing Heel Raises x10   BP: 167/67mmHg   Objective measurements completed on examination: See above findings.       PT Education - 04/04/20 1345    Education provided Yes    Education Details Educated on HEP; POC    Person(s) Educated Patient    Methods Explanation;Demonstration    Comprehension Verbalized understanding;Returned demonstration            PT Short Term  Goals - 04/04/20 1348      PT SHORT TERM GOAL #1   Title Patient will demonstrate independence with HEP to maximize rehab potential.    Time 3    Period Weeks    Status New    Target Date 04/25/20             PT Long Term Goals - 04/04/20 1349      PT LONG TERM GOAL #1   Title Patient will demonstrate independence with progressive HEP to maintain progress made during therapy.    Time 6    Period Weeks    Status New    Target Date 05/16/20      PT LONG TERM GOAL #2   Title Patient will improve FOTO score to 58 to show an improvement in patients ability to perform functional activities.    Baseline 50 at Eval    Time 6    Period Weeks    Status New    Target Date 05/16/20      PT LONG TERM GOAL #3   Title Patient will have a worst pain of 3/10 to indicate significant improvement with pain and ability to perform functional activities more functionally.     Baseline 10/10    Time 6    Period Weeks    Status New    Target Date 05/16/20      PT LONG TERM GOAL #4   Title Patient will be able to descend stairs without pain to show improvement in quad strength and knee stability.    Baseline pain with descending stairs    Time 6  Period Weeks    Status New    Target Date 05/16/20                  Plan - 04/04/20 1420    Clinical Impression Statement Patient is a 69 y/o female with a diagnosis of right knee OA with a c/c of right knee pain along with pain in bilateral hips. Patient LE's are generally weak mainly pertaining to glutes and quads which also produce pain with MMT. Patient also has decreased patellar motion and R knee ROM with lateral knee pain presenting when performing lateral to medial mobilizations of the patella. Stairs were performed to assess a functional task and the patient had anterior knee pain when descending the stairs. The information collected during today's evaluation show that the patients history of R knee OA along with decreased ROM and  LE strength are contributing factors that are causing the patients R knee pain.    Personal Factors and Comorbidities Age;Comorbidity 2    Comorbidities HBP, Obesity    Examination-Activity Limitations Stairs;Locomotion Level;Stand    Examination-Participation Restrictions Shop    Stability/Clinical Decision Making Moderate complexity   Clinical Decision Making Moderate   Rehab Potential Good    Clinical Impairments Affecting Rehab Potential (+) motivated, chronic condition (-) arthritis right knee with pain    PT Frequency 2x / week    PT Duration 6 weeks    PT Treatment/Interventions Electrical Stimulation;Cryotherapy;Moist Heat;Gait training;Stair training;Therapeutic activities;Therapeutic exercise;Balance training;Patient/family education;Neuromuscular re-education;Manual techniques;Passive range of motion;Dry needling;Spinal Manipulations;Joint Manipulations;Functional mobility training;Traction    PT Next Visit Plan Review HEP and begin exercise progression for ROM, strength    PT Home Exercise Plan Heel Raises, standing hip abduction    Consulted and Agree with Plan of Care Patient           Patient will benefit from skilled therapeutic intervention in order to improve the following deficits and impairments:  Pain, Decreased activity tolerance, Decreased endurance, Decreased range of motion, Decreased strength, Impaired perceived functional ability, Difficulty walking, Decreased mobility, Obesity  Visit Diagnosis: Chronic pain of right knee  Muscle weakness (generalized)     Problem List Patient Active Problem List   Diagnosis Date Noted  . Fever blister 10/15/2019  . Cerumen in auditory canal on examination 10/15/2019  . Bilateral hearing loss 10/15/2019  . Need for vaccination against Streptococcus pneumoniae using pneumococcal conjugate vaccine 13 10/15/2019  . Encounter for screening mammogram for malignant neoplasm of breast 10/15/2019  . Screening for osteoporosis  10/15/2019  . Elevated erythrocyte sedimentation rate 11/02/2018  . Lumbar spondylosis 11/02/2018  . PVC's (premature ventricular contractions) 10/18/2018  . Primary osteoarthritis of right knee 09/15/2018  . Encounter for pre-operative examination 09/15/2018  . Obstructive sleep apnea 09/15/2018  . Calculus of gallbladder without cholecystitis without obstruction 09/15/2018  . Calculus of gallbladder with cholecystitis without biliary obstruction 01/27/2018  . Restless leg syndrome 01/27/2018  . Generalized abdominal pain 01/11/2018  . Cyst of pancreas 01/11/2018  . Other specified disorders of kidney and ureter 01/11/2018  . Primary insomnia 01/11/2018  . Paroxysmal atrial fibrillation ([place]) 11/12/2017  . Essential hypertension 10/25/2017  . Mixed hyperlipidemia 10/25/2017  . Low back pain at multiple sites 10/25/2017  . Dysuria 10/25/2017  . Encounter for general adult medical examination with abnormal findings 10/25/2017  . Vitamin D deficiency 10/25/2017  . Near syncope   . Symptomatic bradycardia 10/19/2017  . Carpal tunnel syndrome 10/12/2017  . Primary osteoarthritis of left knee 07/09/2017  . History  of total knee arthroplasty 07/06/2017  . Chest pain 06/29/2017  . Postop check 02/25/2017  . Impingement syndrome of shoulder region 01/20/2017  . Neck pain 01/20/2017  . Obesity (BMI 35.0-39.9 without comorbidity) 07/15/2016  . Endometrial polyp 07/15/2016  . Morbid obesity ([place]) 07/15/2016  . Family history of breast cancer in first degree relative 07/15/2016  . Family history of ovarian cancer 07/15/2016  . Knee pain 07/04/2016  . Bilateral leg pain 06/24/2016   2:49 PM, 04/04/20 Margarito Liner, SPT Student Physical Therapist [place]  [phone]  Margarito Liner 04/04/2020, 2:48 PM  [facility] [address] [place], [address] Phone: [phone]   Fax:  [phone]  Name: Whitney [name] MRN: [id number] Date of Birth: 07/06/1951

## 2020-04-10 ENCOUNTER — Other Ambulatory Visit: Payer: Self-pay

## 2020-04-10 ENCOUNTER — Ambulatory Visit: Payer: Medicare Other

## 2020-04-10 VITALS — BP 150/90

## 2020-04-10 DIAGNOSIS — M6281 Muscle weakness (generalized): Secondary | ICD-10-CM

## 2020-04-10 DIAGNOSIS — M25561 Pain in right knee: Secondary | ICD-10-CM | POA: Diagnosis not present

## 2020-04-10 DIAGNOSIS — M25552 Pain in left hip: Secondary | ICD-10-CM | POA: Diagnosis not present

## 2020-04-10 DIAGNOSIS — G8929 Other chronic pain: Secondary | ICD-10-CM

## 2020-04-10 NOTE — Therapy (Signed)
[place] [facility] [address], [place], [address] Phone: [phone]   Fax:  [phone]  Physical Therapy Treatment  Patient Details  Name: Whitney [name] MRN: [id number] Date of Birth: [birth year] Referring Provider (PT): Kurtis Bushman MD   Encounter Date: 04/10/2020   PT End of Session - 04/10/20 0912    Visit Number 2    Number of Visits 13    Date for PT Re-Evaluation 05/16/20    Authorization Type 2/10    PT Start Time 0900    PT Stop Time 0945    PT Time Calculation (min) 45 min    Activity Tolerance Patient tolerated treatment well    Behavior During Therapy [facility] for tasks assessed/performed           Past Medical History:  Diagnosis Date   Abnormal Pap smear of cervix    Pt states she had colposcopy    Anxiety    Arthritis    Chronic kidney disease    removed left kidney   Depression    Dyspnea    Dysrhythmia    Hx of unilateral nephrectomy 1979   Left Nephrectomy   Hypertension    Lower extremity edema    Palpitations    PMB (postmenopausal bleeding)    Restless leg syndrome    Sleep apnea    has no cpap   Vitamin D deficiency     Past Surgical History:  Procedure Laterality Date   BIOPSY  10/31/2019   Procedure: BIOPSY;  Surgeon: Irving Copas., MD;  Location: [place];  Service: Gastroenterology;;   CARDIAC CATHETERIZATION     CHOLECYSTECTOMY     COLONOSCOPY WITH PROPOFOL N/A 06/13/2019   Procedure: COLONOSCOPY WITH PROPOFOL;  Surgeon: [name], MD;  Location: [facility] ENDOSCOPY;  Service: Gastroenterology;  Laterality: N/A;   COLONOSCOPY WITH PROPOFOL N/A 08/12/2019   Procedure: COLONOSCOPY WITH PROPOFOL;  Surgeon: [name], MD;  Location: [facility] ENDOSCOPY;  Service: Gastroenterology;  Laterality: N/A;   COLONOSCOPY WITH PROPOFOL N/A 09/19/2019   Procedure: COLONOSCOPY WITH PROPOFOL;  Surgeon: [name], MD;  Location: [facility] ENDOSCOPY;  Service:  Gastroenterology;  Laterality: N/A;   DILATION AND CURETTAGE OF UTERUS     ESOPHAGOGASTRODUODENOSCOPY (EGD) WITH PROPOFOL N/A 06/13/2019   Procedure: ESOPHAGOGASTRODUODENOSCOPY (EGD) WITH PROPOFOL;  Surgeon: [name], MD;  Location: [facility] ENDOSCOPY;  Service: Gastroenterology;  Laterality: N/A;  Pt will go for COVID test on 16-6-06 as she uses public transportation and will be in the area on that day.    ESOPHAGOGASTRODUODENOSCOPY (EGD) WITH PROPOFOL N/A 10/31/2019   Procedure: ESOPHAGOGASTRODUODENOSCOPY (EGD) WITH PROPOFOL;  Surgeon: Rush Landmark Telford Nab., MD;  Location: [place];  Service: Gastroenterology;  Laterality: N/A;   HEMOSTASIS CLIP PLACEMENT  10/31/2019   Procedure: HEMOSTASIS CLIP PLACEMENT;  Surgeon: Irving Copas., MD;  Location: [place];  Service: Gastroenterology;;   HYSTEROSCOPY WITH D & C N/A 02/16/2017   Procedure: DILATATION AND CURETTAGE [place];  Surgeon: Brayton Mars, MD;  Location: [facility];  Service: Gynecology;  Laterality: N/A;   JOINT REPLACEMENT     kidney removal Left    KIDNEY SURGERY Left 1979   left kidney removed   LEFT HEART CATH AND CORONARY ANGIOGRAPHY N/A 10/21/2017   Procedure: LEFT HEART CATH AND CORONARY ANGIOGRAPHY;  Surgeon: Teodoro Spray, MD;  Location: [place] CV LAB;  Service: Cardiovascular;  Laterality: N/A;   POLYPECTOMY  10/31/2019   Procedure: POLYPECTOMY;  Surgeon: Justice Britain  Brooke Bonito., MD;  Location: [place];  Service: Gastroenterology;;   TOTAL KNEE ARTHROPLASTY Left 07/06/2017   Procedure: TOTAL KNEE ARTHROPLASTY;  Surgeon: Lovell Sheehan, MD;  Location: [facility];  Service: Orthopedics;  Laterality: Left;   UPPER ESOPHAGEAL ENDOSCOPIC ULTRASOUND (EUS) N/A 10/31/2019   Procedure: UPPER ESOPHAGEAL ENDOSCOPIC ULTRASOUND (EUS);  Surgeon: Irving Copas., MD;  Location: [place];  Service: Gastroenterology;  Laterality: N/A;   WRIST ARTHROSCOPY Left 1970s   XI ROBOTIC  ASSISTED VENTRAL HERNIA N/A 12/01/2019   Procedure: XI ROBOTIC ASSISTED VENTRAL HERNIA;  Surgeon: Jules Husbands, MD;  Location: [facility];  Service: General;  Laterality: N/A;    Vitals:   04/10/20 0906  BP: ! 150/90     Subjective Assessment - 04/10/20 0904    Subjective Patient reports her knee has been good but did have lateral leg pain over the weekend. Reports not performing HEP due to stomach bug.    Pertinent History s/p TKA 07/06/2017; right knee arthritis with weakness as well with reports of right and left leg "giving way" intermittently; Going to have TKA 6/10    Limitations Walking;Standing;House hold activities;Other (comment)    How long can you sit comfortably? No Limit    How long can you stand comfortably? 2-3 hours    How long can you walk comfortably? 2-3 hours    Patient Stated Goals Improve motion and strength of  hips and R knee and being able to walk with less/no pain.    Currently in Pain? No/denies    Pain Onset More than a month ago           INTERVENTIONS  Therapeutic Exercise -Leg Press x20 85lbs, x20 115lbs  -Standing Hip abduction at hip machine 2x12 55lbs  -2MWT: 470ft with SPC -STS from [place] 2x15    Performed Exercises to improve LE strength      PT Education - 04/10/20 0907    Education provided Yes    Education Details Educated on form/technique and BP    Person(s) Educated Patient    Methods Explanation;Demonstration    Comprehension Verbalized understanding;Returned demonstration            PT Short Term Goals - 04/04/20 1348      PT SHORT TERM GOAL #1   Title Patient will demonstrate independence with HEP to maximize rehab potential.    Time 3    Period Weeks    Status New    Target Date 04/25/20             PT Long Term Goals - 04/10/20 0926      PT LONG TERM GOAL #1   Title Patient will demonstrate independence with progressive HEP to maintain progress made during therapy.    Time 6    Period Weeks    Status  New      PT LONG TERM GOAL #2   Title Patient will improve FOTO score to 58 to show an improvement in patients ability to perform functional activities.    Baseline 50 at Eval    Time 6    Period Weeks    Status New      PT LONG TERM GOAL #3   Title Patient will have a worst pain of 3/10 to indicate significant improvement with pain and ability to perform functional activities more functionally.     Baseline 10/10    Time 6    Period Weeks    Status New  PT LONG TERM GOAL #4   Title Patient will be able to descend stairs without pain to show improvement in quad strength and knee stability.    Baseline pain with descending stairs    Time 6    Period Weeks    Status New      PT LONG TERM GOAL #5   Title Patient will improve 2MWT test by 17ft to show improvement in patients aerobic capacity and ability to ambulate out in community.    Baseline 419ft at eval.6    Time 6    Period Weeks    Status New    Target Date 05/16/20                 Plan - 04/10/20 0913    Clinical Impression Statement Monitored patients vitals at beginning of session and throughout session to ensure safety for patient and ability to perform exercises safely. Performed 2MWT with SPC to test patients aerobic capacity due to limitations of being able to perform 6MWT.  Patient walked 419ft during 2MWT with moderated fatigue after (BP: after 190/100; 4 min post 150/90). Began LE strengthening during today s session at which she tolerated well without an increase in symptoms. Patient required verbal cues to control eccentric motions of exercises.  Patient will continue to benefit from skilled therapy to address limitations and return to prior level of function.    Personal Factors and Comorbidities Age;Comorbidity 2    Comorbidities HBP, Obesity    Examination-Activity Limitations Stairs;Locomotion Level;Stand    Examination-Participation Restrictions Shop    Stability/Clinical Decision Making  Evolving/Moderate complexity    Clinical Decision Making Moderate    Rehab Potential Good    Clinical Impairments Affecting Rehab Potential (+) motivated, chronic condition (-) arthritis right knee with pain    PT Frequency 2x / week    PT Duration 6 weeks    PT Treatment/Interventions Electrical Stimulation;Cryotherapy;Moist Heat;Gait training;Stair training;Therapeutic activities;Therapeutic exercise;Balance training;Patient/family education;Neuromuscular re-education;Manual techniques;Passive range of motion;Dry needling;Spinal Manipulations;Joint Manipulations;Functional mobility training;Traction    PT Next Visit Plan Monitor BP throughout session and continue POC    PT Home Exercise Plan Heel Raises, standing hip abduction    Consulted and Agree with Plan of Care Patient           Patient will benefit from skilled therapeutic intervention in order to improve the following deficits and impairments:  Pain, Decreased activity tolerance, Decreased endurance, Decreased range of motion, Decreased strength, Impaired perceived functional ability, Difficulty walking, Decreased mobility, Obesity  Visit Diagnosis: Chronic pain of right knee  Muscle weakness (generalized)     Problem List Patient Active Problem List   Diagnosis Date Noted   Fever blister 10/15/2019   Cerumen in auditory canal on examination 10/15/2019   Bilateral hearing loss 10/15/2019   Need for vaccination against Streptococcus pneumoniae using pneumococcal conjugate vaccine 13 10/15/2019   Encounter for screening mammogram for malignant neoplasm of breast 10/15/2019   Screening for osteoporosis 10/15/2019   Elevated erythrocyte sedimentation rate 11/02/2018   Lumbar spondylosis 11/02/2018   PVC's (premature ventricular contractions) 10/18/2018   Primary osteoarthritis of right knee 09/15/2018   Encounter for pre-operative examination 09/15/2018   Obstructive sleep apnea 09/15/2018   Calculus of  gallbladder without cholecystitis without obstruction 09/15/2018   Calculus of gallbladder with cholecystitis without biliary obstruction 01/27/2018   Restless leg syndrome 01/27/2018   Generalized abdominal pain 01/11/2018   Cyst of pancreas 01/11/2018   Other specified disorders of kidney and ureter 01/11/2018  Primary insomnia 01/11/2018   Paroxysmal atrial fibrillation ([place]) 11/12/2017   Essential hypertension 10/25/2017   Mixed hyperlipidemia 10/25/2017   Low back pain at multiple sites 10/25/2017   Dysuria 10/25/2017   Encounter for general adult medical examination with abnormal findings 10/25/2017   Vitamin D deficiency 10/25/2017   Near syncope    Symptomatic bradycardia 10/19/2017   Carpal tunnel syndrome 10/12/2017   Primary osteoarthritis of left knee 07/09/2017   History of total knee arthroplasty 07/06/2017   Chest pain 06/29/2017   Postop check 02/25/2017   Impingement syndrome of shoulder region 01/20/2017   Neck pain 01/20/2017   Obesity (BMI 35.0-39.9 without comorbidity) 07/15/2016   Endometrial polyp 07/15/2016   Morbid obesity ([place]) 07/15/2016   Family history of breast cancer in first degree relative 07/15/2016   Family history of ovarian cancer 07/15/2016   Knee pain 07/04/2016   Bilateral leg pain 06/24/2016   9:45 AM, 04/10/20 Margarito Liner, SPT Student Physical Therapist [place]  Margarito Liner 04/10/2020, 9:43 AM  [place] [address] [place], [address] Phone: [phone]   Fax:  [phone]  Name: Whitney [name] MRN: [id number] Date of Birth: [birth year]

## 2020-04-12 ENCOUNTER — Ambulatory Visit: Payer: Medicare Other

## 2020-04-13 ENCOUNTER — Ambulatory Visit (INDEPENDENT_AMBULATORY_CARE_PROVIDER_SITE_OTHER): Payer: Medicare Other

## 2020-04-13 DIAGNOSIS — I10 Essential (primary) hypertension: Secondary | ICD-10-CM | POA: Diagnosis not present

## 2020-04-13 DIAGNOSIS — I6523 Occlusion and stenosis of bilateral carotid arteries: Secondary | ICD-10-CM

## 2020-04-13 DIAGNOSIS — E782 Mixed hyperlipidemia: Secondary | ICD-10-CM

## 2020-04-16 ENCOUNTER — Ambulatory Visit: Payer: Medicare Other

## 2020-04-16 ENCOUNTER — Other Ambulatory Visit: Payer: Self-pay

## 2020-04-16 DIAGNOSIS — M25552 Pain in left hip: Secondary | ICD-10-CM

## 2020-04-16 DIAGNOSIS — M6281 Muscle weakness (generalized): Secondary | ICD-10-CM | POA: Diagnosis not present

## 2020-04-16 DIAGNOSIS — G8929 Other chronic pain: Secondary | ICD-10-CM | POA: Diagnosis not present

## 2020-04-16 DIAGNOSIS — M25561 Pain in right knee: Secondary | ICD-10-CM | POA: Diagnosis not present

## 2020-04-16 NOTE — Therapy (Signed)
[facility] [facility] [address], [place], [address] Phone: [phone]   Fax:  [phone]  Physical Therapy Treatment  Patient Details  Name: Whitney [name] MRN: [id number] Date of Birth: [birth year] Referring Provider (PT): Kurtis Bushman MD   Encounter Date: 04/16/2020   PT End of Session - 04/16/20 0956    Visit Number 3    Number of Visits 13    Date for PT Re-Evaluation 05/16/20    Authorization Type 3/10    PT Start Time 0950    PT Stop Time 1035    PT Time Calculation (min) 45 min    Activity Tolerance Patient tolerated treatment well    Behavior During Therapy [facility] for tasks assessed/performed           Past Medical History:  Diagnosis Date  . Abnormal Pap smear of cervix    Pt states she had colposcopy   . Anxiety   . Arthritis   . Chronic kidney disease    removed left kidney  . Depression   . Dyspnea   . Dysrhythmia   . Hx of unilateral nephrectomy 1979   Left Nephrectomy  . Hypertension   . Lower extremity edema   . Palpitations   . PMB (postmenopausal bleeding)   . Restless leg syndrome   . Sleep apnea    has no cpap  . Vitamin D deficiency     Past Surgical History:  Procedure Laterality Date  . BIOPSY  10/31/2019   Procedure: BIOPSY;  Surgeon: Rush Landmark Telford Nab., MD;  Location: [place];  Service: Gastroenterology;;  . CARDIAC CATHETERIZATION    . CHOLECYSTECTOMY    . COLONOSCOPY WITH PROPOFOL N/A 06/13/2019   Procedure: COLONOSCOPY WITH PROPOFOL;  Surgeon: [name], MD;  Location: [facility] ENDOSCOPY;  Service: Gastroenterology;  Laterality: N/A;  . COLONOSCOPY WITH PROPOFOL N/A 08/12/2019   Procedure: COLONOSCOPY WITH PROPOFOL;  Surgeon: [name], MD;  Location: [facility] ENDOSCOPY;  Service: Gastroenterology;  Laterality: N/A;  . COLONOSCOPY WITH PROPOFOL N/A 09/19/2019   Procedure: COLONOSCOPY WITH PROPOFOL;  Surgeon: [name], MD;  Location: [facility] ENDOSCOPY;  Service:  Gastroenterology;  Laterality: N/A;  . DILATION AND CURETTAGE OF UTERUS    . ESOPHAGOGASTRODUODENOSCOPY (EGD) WITH PROPOFOL N/A 06/13/2019   Procedure: ESOPHAGOGASTRODUODENOSCOPY (EGD) WITH PROPOFOL;  Surgeon: [name], MD;  Location: [facility] ENDOSCOPY;  Service: Gastroenterology;  Laterality: N/A;  Pt will go for COVID test on 17-6-16 as she uses public transportation and will be in the area on that day.   . ESOPHAGOGASTRODUODENOSCOPY (EGD) WITH PROPOFOL N/A 10/31/2019   Procedure: ESOPHAGOGASTRODUODENOSCOPY (EGD) WITH PROPOFOL;  Surgeon: Rush Landmark Telford Nab., MD;  Location: [place];  Service: Gastroenterology;  Laterality: N/A;  . HEMOSTASIS CLIP PLACEMENT  10/31/2019   Procedure: HEMOSTASIS CLIP PLACEMENT;  Surgeon: Irving Copas., MD;  Location: [place];  Service: Gastroenterology;;  . HYSTEROSCOPY WITH D & C N/A 02/16/2017   Procedure: DILATATION AND CURETTAGE [place];  Surgeon: Brayton Mars, MD;  Location: [facility];  Service: Gynecology;  Laterality: N/A;  . JOINT REPLACEMENT    . kidney removal Left   . KIDNEY SURGERY Left 1979   left kidney removed  . LEFT HEART CATH AND CORONARY ANGIOGRAPHY N/A 10/21/2017   Procedure: LEFT HEART CATH AND CORONARY ANGIOGRAPHY;  Surgeon: Teodoro Spray, MD;  Location: [place] CV LAB;  Service: Cardiovascular;  Laterality: N/A;  . POLYPECTOMY  10/31/2019   Procedure: POLYPECTOMY;  Surgeon: Justice Britain  Brooke Bonito., MD;  Location: [place];  Service: Gastroenterology;;  . TOTAL KNEE ARTHROPLASTY Left 07/06/2017   Procedure: TOTAL KNEE ARTHROPLASTY;  Surgeon: Lovell Sheehan, MD;  Location: [facility];  Service: Orthopedics;  Laterality: Left;  . UPPER ESOPHAGEAL ENDOSCOPIC ULTRASOUND (EUS) N/A 10/31/2019   Procedure: UPPER ESOPHAGEAL ENDOSCOPIC ULTRASOUND (EUS);  Surgeon: Irving Copas., MD;  Location: [place];  Service: Gastroenterology;  Laterality: N/A;  . WRIST ARTHROSCOPY Left 1970s  . XI ROBOTIC  ASSISTED VENTRAL HERNIA N/A 12/01/2019   Procedure: XI ROBOTIC ASSISTED VENTRAL HERNIA;  Surgeon: Jules Husbands, MD;  Location: [facility];  Service: General;  Laterality: N/A;    There were no vitals filed for this visit.   Subjective Assessment - 04/16/20 0954    Subjective Patient reports her knee has been fine after a lot of walking this weekend.    Pertinent History s/p TKA 07/06/2017; right knee arthritis with weakness as well with reports of right and left leg "giving way" intermittently; Going to have TKA 6/10    Limitations Walking;Standing;House hold activities;Other (comment)    How long can you sit comfortably? No Limit    How long can you stand comfortably? 2-3 hours    How long can you walk comfortably? 2-3 hours    Patient Stated Goals Improve motion and strength of  hips and R knee and being able to walk with less/no pain.    Currently in Pain? No/denies    Pain Onset More than a month ago           INTERVENTIONS   Therapeutic Exercise -Leg Press 2x20 85lbs -Standing Hip abduction at hip machine 2x12 55lbs  -STS from lowest mat position 2x15   -Lunges with UE Support 2x8B    Performed Exercises to improve LE strength       PT Education - 04/16/20 0955    Education provided Yes    Education Details form/technique of exercises and BP    Person(s) Educated Patient    Methods Explanation;Demonstration    Comprehension Verbalized understanding;Returned demonstration            PT Short Term Goals - 04/04/20 1348      PT SHORT TERM GOAL #1   Title Patient will demonstrate independence with HEP to maximize rehab potential.    Time 3    Period Weeks    Status New    Target Date 04/25/20             PT Long Term Goals - 04/10/20 0926      PT LONG TERM GOAL #1   Title Patient will demonstrate independence with progressive HEP to maintain progress made during therapy.    Time 6    Period Weeks    Status New      PT LONG TERM GOAL #2   Title Patient  will improve FOTO score to 58 to show an improvement in patients ability to perform functional activities.    Baseline 50 at Eval    Time 6    Period Weeks    Status New      PT LONG TERM GOAL #3   Title Patient will have a worst pain of 3/10 to indicate significant improvement with pain and ability to perform functional activities more functionally.     Baseline 10/10    Time 6    Period Weeks    Status New      PT LONG TERM GOAL #4   Title Patient  will be able to descend stairs without pain to show improvement in quad strength and knee stability.    Baseline pain with descending stairs    Time 6    Period Weeks    Status New      PT LONG TERM GOAL #5   Title Patient will improve 2MWT test by 80ft to show improvement in patients aerobic capacity and ability to ambulate out in community.    Baseline 468ft at eval.6    Time 6    Period Weeks    Status New    Target Date 05/16/20                 Plan - 04/16/20 0957    Clinical Impression Statement Continued to monitor patient's vitals during session and they remained in safe range to perform exercises. Patient's muscular endurance and strength remains limited and is demonstrated by many episodes of SOB and fatigue after performing exercises. Did not increase exercise volume at today's session due to patient being tired. Patient requires verbal cues to continuing breathing during exercises; tends to hold breath during exercises.  Patient will continue to benefit from skilled therapy to return to prior level of function.    Personal Factors and Comorbidities Age;Comorbidity 2    Comorbidities HBP, Obesity    Examination-Activity Limitations Stairs;Locomotion Level;Stand    Examination-Participation Restrictions [place]    Stability/Clinical Decision Making Evolving/Moderate complexity    Clinical Decision Making Moderate    Rehab Potential Good    Clinical Impairments Affecting Rehab Potential (+) motivated, chronic condition  (-) arthritis right knee with pain    PT Frequency 2x / week    PT Duration 6 weeks    PT Treatment/Interventions Electrical Stimulation;Cryotherapy;Moist Heat;Gait training;Stair training;Therapeutic activities;Therapeutic exercise;Balance training;Patient/family education;Neuromuscular re-education;Manual techniques;Passive range of motion;Dry needling;Spinal Manipulations;Joint Manipulations;Functional mobility training;Traction    PT Next Visit Plan Monitor BP throughout session and continue POC    PT Home Exercise Plan Heel Raises, standing hip abduction    Consulted and Agree with Plan of Care Patient           Patient will benefit from skilled therapeutic intervention in order to improve the following deficits and impairments:  Pain, Decreased activity tolerance, Decreased endurance, Decreased range of motion, Decreased strength, Impaired perceived functional ability, Difficulty walking, Decreased mobility, Obesity  Visit Diagnosis: Chronic pain of right knee  Muscle weakness (generalized)  Pain in left hip     Problem List Patient Active Problem List   Diagnosis Date Noted  . Fever blister 10/15/2019  . Cerumen in auditory canal on examination 10/15/2019  . Bilateral hearing loss 10/15/2019  . Need for vaccination against Streptococcus pneumoniae using pneumococcal conjugate vaccine 13 10/15/2019  . Encounter for screening mammogram for malignant neoplasm of breast 10/15/2019  . Screening for osteoporosis 10/15/2019  . Elevated erythrocyte sedimentation rate 11/02/2018  . Lumbar spondylosis 11/02/2018  . PVC's (premature ventricular contractions) 10/18/2018  . Primary osteoarthritis of right knee 09/15/2018  . Encounter for pre-operative examination 09/15/2018  . Obstructive sleep apnea 09/15/2018  . Calculus of gallbladder without cholecystitis without obstruction 09/15/2018  . Calculus of gallbladder with cholecystitis without biliary obstruction 01/27/2018  .  Restless leg syndrome 01/27/2018  . Generalized abdominal pain 01/11/2018  . Cyst of pancreas 01/11/2018  . Other specified disorders of kidney and ureter 01/11/2018  . Primary insomnia 01/11/2018  . Paroxysmal atrial fibrillation ([place]) 11/12/2017  . Essential hypertension 10/25/2017  . Mixed hyperlipidemia 10/25/2017  . Low back  pain at multiple sites 10/25/2017  . Dysuria 10/25/2017  . Encounter for general adult medical examination with abnormal findings 10/25/2017  . Vitamin D deficiency 10/25/2017  . Near syncope   . Symptomatic bradycardia 10/19/2017  . Carpal tunnel syndrome 10/12/2017  . Primary osteoarthritis of left knee 07/09/2017  . History of total knee arthroplasty 07/06/2017  . Chest pain 06/29/2017  . Postop check 02/25/2017  . Impingement syndrome of shoulder region 01/20/2017  . Neck pain 01/20/2017  . Obesity (BMI 35.0-39.9 without comorbidity) 07/15/2016  . Endometrial polyp 07/15/2016  . Morbid obesity ([place]) 07/15/2016  . Family history of breast cancer in first degree relative 07/15/2016  . Family history of ovarian cancer 07/15/2016  . Knee pain 07/04/2016  . Bilateral leg pain 06/24/2016   10:35 AM, 04/16/20 Margarito Liner, SPT Student Physical Therapist [place]  [phone]  Margarito Liner 04/16/2020, 10:30 AM  [place] [facility] [address] [place], [address] Phone: [phone]   Fax:  [phone]  Name: Whitney [name] MRN: [id number] Date of Birth: [birth year]

## 2020-04-19 ENCOUNTER — Other Ambulatory Visit: Payer: Self-pay

## 2020-04-19 ENCOUNTER — Ambulatory Visit: Payer: Medicare Other

## 2020-04-19 DIAGNOSIS — M25552 Pain in left hip: Secondary | ICD-10-CM | POA: Diagnosis not present

## 2020-04-19 DIAGNOSIS — M6281 Muscle weakness (generalized): Secondary | ICD-10-CM | POA: Diagnosis not present

## 2020-04-19 DIAGNOSIS — M25561 Pain in right knee: Secondary | ICD-10-CM | POA: Diagnosis not present

## 2020-04-19 DIAGNOSIS — G8929 Other chronic pain: Secondary | ICD-10-CM | POA: Diagnosis not present

## 2020-04-19 NOTE — Therapy (Signed)
[facility] [address] [place], [address] Phone: [phone]   Fax:  [phone]  Physical Therapy Treatment  Patient Details  Name: Whitney [name] MRN: [id number] Date of Birth: [birth year] Referring Provider (PT): Kurtis Bushman MD   Encounter Date: 04/19/2020   PT End of Session - 04/19/20 1348    Visit Number 4    Number of Visits 13    Date for PT Re-Evaluation 05/16/20    Authorization Type 4/10    PT Start Time 4403    PT Stop Time 1430    PT Time Calculation (min) 45 min    Activity Tolerance Patient tolerated treatment well    Behavior During Therapy [facility] for tasks assessed/performed           Past Medical History:  Diagnosis Date  . Abnormal Pap smear of cervix    Pt states she had colposcopy   . Anxiety   . Arthritis   . Chronic kidney disease    removed left kidney  . Depression   . Dyspnea   . Dysrhythmia   . Hx of unilateral nephrectomy 1979   Left Nephrectomy  . Hypertension   . Lower extremity edema   . Palpitations   . PMB (postmenopausal bleeding)   . Restless leg syndrome   . Sleep apnea    has no cpap  . Vitamin D deficiency     Past Surgical History:  Procedure Laterality Date  . BIOPSY  10/31/2019   Procedure: BIOPSY;  Surgeon: Rush Landmark Telford Nab., MD;  Location: [place];  Service: Gastroenterology;;  . CARDIAC CATHETERIZATION    . CHOLECYSTECTOMY    . COLONOSCOPY WITH PROPOFOL N/A 06/13/2019   Procedure: COLONOSCOPY WITH PROPOFOL;  Surgeon: [name], MD;  Location: [facility] ENDOSCOPY;  Service: Gastroenterology;  Laterality: N/A;  . COLONOSCOPY WITH PROPOFOL N/A 08/12/2019   Procedure: COLONOSCOPY WITH PROPOFOL;  Surgeon: [name], MD;  Location: [facility] ENDOSCOPY;  Service: Gastroenterology;  Laterality: N/A;  . COLONOSCOPY WITH PROPOFOL N/A 09/19/2019   Procedure: COLONOSCOPY WITH PROPOFOL;  Surgeon: [name], MD;  Location: [facility] ENDOSCOPY;  Service:  Gastroenterology;  Laterality: N/A;  . DILATION AND CURETTAGE OF UTERUS    . ESOPHAGOGASTRODUODENOSCOPY (EGD) WITH PROPOFOL N/A 06/13/2019   Procedure: ESOPHAGOGASTRODUODENOSCOPY (EGD) WITH PROPOFOL;  Surgeon: [name], MD;  Location: [facility] ENDOSCOPY;  Service: Gastroenterology;  Laterality: N/A;  Pt will go for COVID test on 47-4-25 as she uses public transportation and will be in the area on that day.   . ESOPHAGOGASTRODUODENOSCOPY (EGD) WITH PROPOFOL N/A 10/31/2019   Procedure: ESOPHAGOGASTRODUODENOSCOPY (EGD) WITH PROPOFOL;  Surgeon: Rush Landmark Telford Nab., MD;  Location: [place];  Service: Gastroenterology;  Laterality: N/A;  . HEMOSTASIS CLIP PLACEMENT  10/31/2019   Procedure: HEMOSTASIS CLIP PLACEMENT;  Surgeon: Irving Copas., MD;  Location: [place];  Service: Gastroenterology;;  . HYSTEROSCOPY WITH D & C N/A 02/16/2017   Procedure: DILATATION AND CURETTAGE [place];  Surgeon: Brayton Mars, MD;  Location: [facility] ORS;  Service: Gynecology;  Laterality: N/A;  . JOINT REPLACEMENT    . kidney removal Left   . KIDNEY SURGERY Left 1979   left kidney removed  . LEFT HEART CATH AND CORONARY ANGIOGRAPHY N/A 10/21/2017   Procedure: LEFT HEART CATH AND CORONARY ANGIOGRAPHY;  Surgeon: Teodoro Spray, MD;  Location: [place] CV LAB;  Service: Cardiovascular;  Laterality: N/A;  . POLYPECTOMY  10/31/2019   Procedure: POLYPECTOMY;  Surgeon: Justice Britain  Brooke Bonito., MD;  Location: [place];  Service: Gastroenterology;;  . TOTAL KNEE ARTHROPLASTY Left 07/06/2017   Procedure: TOTAL KNEE ARTHROPLASTY;  Surgeon: Lovell Sheehan, MD;  Location: [facility] ORS;  Service: Orthopedics;  Laterality: Left;  . UPPER ESOPHAGEAL ENDOSCOPIC ULTRASOUND (EUS) N/A 10/31/2019   Procedure: UPPER ESOPHAGEAL ENDOSCOPIC ULTRASOUND (EUS);  Surgeon: Irving Copas., MD;  Location: [place];  Service: Gastroenterology;  Laterality: N/A;  . WRIST ARTHROSCOPY Left 1970s  . XI ROBOTIC  ASSISTED VENTRAL HERNIA N/A 12/01/2019   Procedure: XI ROBOTIC ASSISTED VENTRAL HERNIA;  Surgeon: Jules Husbands, MD;  Location: [facility] ORS;  Service: General;  Laterality: N/A;    There were no vitals filed for this visit.   Subjective Assessment - 04/19/20 1347    Subjective Patient reports her left knee started hurting right before coming to therapy today.    Pertinent History s/p TKA 07/06/2017; right knee arthritis with weakness as well with reports of right and left leg "giving way" intermittently; Going to have TKA 6/10    Limitations Walking;Standing;House hold activities;Other (comment)    How long can you sit comfortably? No Limit    How long can you stand comfortably? 2-3 hours    How long can you walk comfortably? 2-3 hours    Patient Stated Goals Improve motion and strength of  hips and R knee and being able to walk with less/no pain.    Currently in Pain? No/denies    Pain Onset More than a month ago          INTERVENTIONS    Therapeutic Exercise -STS from lowest mat position 2x15   -Standing Hip Abduction 2x10  -Standing Hip Extension 2x10 -Lunges with UE Support 2x8 B    Performed Exercises to improve LE strength     PT Education - 04/19/20 1348    Education provided Yes    Education Details form/technique of exercises and BP    Person(s) Educated Patient    Methods Explanation;Demonstration    Comprehension Verbalized understanding;Returned demonstration            PT Short Term Goals - 04/04/20 1348      PT SHORT TERM GOAL #1   Title Patient will demonstrate independence with HEP to maximize rehab potential.    Time 3    Period Weeks    Status New    Target Date 04/25/20             PT Long Term Goals - 04/10/20 0926      PT LONG TERM GOAL #1   Title Patient will demonstrate independence with progressive HEP to maintain progress made during therapy.    Time 6    Period Weeks    Status New      PT LONG TERM GOAL #2   Title Patient will  improve FOTO score to 58 to show an improvement in patients ability to perform functional activities.    Baseline 50 at Eval    Time 6    Period Weeks    Status New      PT LONG TERM GOAL #3   Title Patient will have a worst pain of 3/10 to indicate significant improvement with pain and ability to perform functional activities more functionally.     Baseline 10/10    Time 6    Period Weeks    Status New      PT LONG TERM GOAL #4   Title Patient will be able to descend stairs  without pain to show improvement in quad strength and knee stability.    Baseline pain with descending stairs    Time 6    Period Weeks    Status New      PT LONG TERM GOAL #5   Title Patient will improve 2MWT test by 21ft to show improvement in patients aerobic capacity and ability to ambulate out in community.    Baseline 419ft at eval.6    Time 6    Period Weeks    Status New    Target Date 05/16/20                 Plan - 04/19/20 1349    Clinical Impression Statement Continued to monitor patient's vitals during session at which patients BP (174/105) was high at the beginning of the session; exercises were not performed.  Reassessed patient's vitals 10 minutes later and they were in an appropriate range for exercises (BP: 165/90).  Continued to focus on improving patients LE through body weight exercises at which patient tolerated well.  Patient's vitals remained appropriate for exercises throughout session. Will continue to monitor vitals and improve patients LE through skilled therapy for patient to return to prior level of function.    Personal Factors and Comorbidities Age;Comorbidity 2    Comorbidities HBP, Obesity    Examination-Activity Limitations Stairs;Locomotion Level;Stand    Examination-Participation Restrictions Shop    Stability/Clinical Decision Making Evolving/Moderate complexity    Clinical Decision Making Moderate    Rehab Potential Good    Clinical Impairments Affecting Rehab  Potential (+) motivated, chronic condition (-) arthritis right knee with pain    PT Frequency 2x / week    PT Duration 6 weeks    PT Treatment/Interventions Electrical Stimulation;Cryotherapy;Moist Heat;Gait training;Stair training;Therapeutic activities;Therapeutic exercise;Balance training;Patient/family education;Neuromuscular re-education;Manual techniques;Passive range of motion;Dry needling;Spinal Manipulations;Joint Manipulations;Functional mobility training;Traction    PT Next Visit Plan Monitor BP throughout session and continue POC    PT Home Exercise Plan Heel Raises, standing hip abduction    Consulted and Agree with Plan of Care Patient           Patient will benefit from skilled therapeutic intervention in order to improve the following deficits and impairments:  Pain, Decreased activity tolerance, Decreased endurance, Decreased range of motion, Decreased strength, Impaired perceived functional ability, Difficulty walking, Decreased mobility, Obesity  Visit Diagnosis: Chronic pain of right knee  Muscle weakness (generalized)  Pain in left hip     Problem List Patient Active Problem List   Diagnosis Date Noted  . Fever blister 10/15/2019  . Cerumen in auditory canal on examination 10/15/2019  . Bilateral hearing loss 10/15/2019  . Need for vaccination against Streptococcus pneumoniae using pneumococcal conjugate vaccine 13 10/15/2019  . Encounter for screening mammogram for malignant neoplasm of breast 10/15/2019  . Screening for osteoporosis 10/15/2019  . Elevated erythrocyte sedimentation rate 11/02/2018  . Lumbar spondylosis 11/02/2018  . PVC's (premature ventricular contractions) 10/18/2018  . Primary osteoarthritis of right knee 09/15/2018  . Encounter for pre-operative examination 09/15/2018  . Obstructive sleep apnea 09/15/2018  . Calculus of gallbladder without cholecystitis without obstruction 09/15/2018  . Calculus of gallbladder with cholecystitis  without biliary obstruction 01/27/2018  . Restless leg syndrome 01/27/2018  . Generalized abdominal pain 01/11/2018  . Cyst of pancreas 01/11/2018  . Other specified disorders of kidney and ureter 01/11/2018  . Primary insomnia 01/11/2018  . Paroxysmal atrial fibrillation ([place]) 11/12/2017  . Essential hypertension 10/25/2017  . Mixed hyperlipidemia 10/25/2017  .  Low back pain at multiple sites 10/25/2017  . Dysuria 10/25/2017  . Encounter for general adult medical examination with abnormal findings 10/25/2017  . Vitamin D deficiency 10/25/2017  . Near syncope   . Symptomatic bradycardia 10/19/2017  . Carpal tunnel syndrome 10/12/2017  . Primary osteoarthritis of left knee 07/09/2017  . History of total knee arthroplasty 07/06/2017  . Chest pain 06/29/2017  . Postop check 02/25/2017  . Impingement syndrome of shoulder region 01/20/2017  . Neck pain 01/20/2017  . Obesity (BMI 35.0-39.9 without comorbidity) 07/15/2016  . Endometrial polyp 07/15/2016  . Morbid obesity ([place]) 07/15/2016  . Family history of breast cancer in first degree relative 07/15/2016  . Family history of ovarian cancer 07/15/2016  . Knee pain 07/04/2016  . Bilateral leg pain 06/24/2016   2:53 PM, 04/19/20 Margarito Liner, SPT Student Physical Therapist [place]  [phone]  Margarito Liner 04/19/2020, 2:52 PM  [facility] [address], [place], [address] Phone: [phone]   Fax:  [phone]  Name: DEVONNA OBOYLE MRN: [id number] Date of Birth: 12-22-1950

## 2020-04-22 NOTE — Procedures (Signed)
[place], [place] [address]  DATE OF SERVICE: April 13, 2020  CAROTID DOPPLER INTERPRETATION:  Bilateral Carotid Ultrsasound and Color Doppler Examination was performed. The RIGHT CCA shows no significant plaque in the vessel. The LEFT CCA shows no significant plaque in the vessel. There was no significant intimal thickening noted in the RIGHT carotid artery. There was no significant intimal thickening in the LEFT carotid artery.  The RIGHT CCA shows peak systolic velocity of 44.5 cm per second. The end diastolic velocity is 14.6 cm per second on the RIGHT side. The RIGHT ICA shows peak systolic velocity of 04.7 per second. RIGHT sided ICA end diastolic velocity is 26 cm per second. The RIGHT ECA shows a peak systolic velocity of 99.8 cm per second. The ICA/CCA ratio is calculated to be 0.7. This suggests less than 50% stenosis. The Vertebral Artery shows antegrade flow.  The LEFT CCA shows peak systolic velocity of 72.1 cm per second. The end diastolic velocity is 58.7 cm per second on the LEFT side. The LEFT ICA shows peak systolic velocity of 27.6 per second. LEFT sided ICA end diastolic velocity is 18.4 cm per second. The LEFT ECA shows a peak systolic velocity of 85.9 cm per second. The ICA/CCA ratio is calculated to be 0.95. This suggests less than 50% stenosis. The Vertebral Artery shows antegrade flow.   Impression:    The RIGHT CAROTID shows less than 50% stenosis. The LEFT CAROTID shows less than 50% stenosis.  There is no significant plaque formation noted on the LEFT and no significant plaque on the RIGHT  side. Consider a repeat Carotid doppler if clinical situation and symptoms warrant in 6-12 months. Patient should be encouraged to change lifestyles such as smoking cessation, regular exercise and dietary modification. Use of statins in the right clinical setting and ASA is encouraged.  Allyne Gee, MD [facility] Pulmonary Critical Care  Medicine

## 2020-04-24 ENCOUNTER — Other Ambulatory Visit: Payer: Self-pay

## 2020-04-24 ENCOUNTER — Ambulatory Visit: Payer: Medicare Other

## 2020-04-24 DIAGNOSIS — G8929 Other chronic pain: Secondary | ICD-10-CM

## 2020-04-24 DIAGNOSIS — M6281 Muscle weakness (generalized): Secondary | ICD-10-CM | POA: Diagnosis not present

## 2020-04-24 DIAGNOSIS — M25552 Pain in left hip: Secondary | ICD-10-CM

## 2020-04-24 DIAGNOSIS — M25561 Pain in right knee: Secondary | ICD-10-CM | POA: Diagnosis not present

## 2020-04-24 NOTE — Therapy (Signed)
[facility] [address] [place], [address] Phone: [phone]   Fax:  [phone]  Physical Therapy Treatment  Patient Details  Name: Whitney [name] MRN: [id number] Date of Birth: October 13, 1950 Referring Provider (PT): Kurtis Bushman MD   Encounter Date: 04/24/2020   PT End of Session - 04/24/20 1304    Visit Number 5    Number of Visits 13    Date for PT Re-Evaluation 05/16/20    Authorization Type 5/10    PT Start Time 1300    PT Stop Time 1345    PT Time Calculation (min) 45 min    Activity Tolerance Patient tolerated treatment well    Behavior During Therapy [facility] for tasks assessed/performed           Past Medical History:  Diagnosis Date  . Abnormal Pap smear of cervix    Pt states she had colposcopy   . Anxiety   . Arthritis   . Chronic kidney disease    removed left kidney  . Depression   . Dyspnea   . Dysrhythmia   . Hx of unilateral nephrectomy 1979   Left Nephrectomy  . Hypertension   . Lower extremity edema   . Palpitations   . PMB (postmenopausal bleeding)   . Restless leg syndrome   . Sleep apnea    has no cpap  . Vitamin D deficiency     Past Surgical History:  Procedure Laterality Date  . BIOPSY  10/31/2019   Procedure: BIOPSY;  Surgeon: Rush Landmark Telford Nab., MD;  Location: [place];  Service: Gastroenterology;;  . CARDIAC CATHETERIZATION    . CHOLECYSTECTOMY    . COLONOSCOPY WITH PROPOFOL N/A 06/13/2019   Procedure: COLONOSCOPY WITH PROPOFOL;  Surgeon: [name], MD;  Location: [facility] ENDOSCOPY;  Service: Gastroenterology;  Laterality: N/A;  . COLONOSCOPY WITH PROPOFOL N/A 08/12/2019   Procedure: COLONOSCOPY WITH PROPOFOL;  Surgeon: [name], MD;  Location: [facility] ENDOSCOPY;  Service: Gastroenterology;  Laterality: N/A;  . COLONOSCOPY WITH PROPOFOL N/A 09/19/2019   Procedure: COLONOSCOPY WITH PROPOFOL;  Surgeon: [name], MD;  Location: [facility] ENDOSCOPY;  Service:  Gastroenterology;  Laterality: N/A;  . DILATION AND CURETTAGE OF UTERUS    . ESOPHAGOGASTRODUODENOSCOPY (EGD) WITH PROPOFOL N/A 06/13/2019   Procedure: ESOPHAGOGASTRODUODENOSCOPY (EGD) WITH PROPOFOL;  Surgeon: [name], MD;  Location: [facility] ENDOSCOPY;  Service: Gastroenterology;  Laterality: N/A;  Pt will go for COVID test on 56-8-12 as she uses public transportation and will be in the area on that day.   . ESOPHAGOGASTRODUODENOSCOPY (EGD) WITH PROPOFOL N/A 10/31/2019   Procedure: ESOPHAGOGASTRODUODENOSCOPY (EGD) WITH PROPOFOL;  Surgeon: Rush Landmark Telford Nab., MD;  Location: [place];  Service: Gastroenterology;  Laterality: N/A;  . HEMOSTASIS CLIP PLACEMENT  10/31/2019   Procedure: HEMOSTASIS CLIP PLACEMENT;  Surgeon: Irving Copas., MD;  Location: [place];  Service: Gastroenterology;;  . HYSTEROSCOPY WITH D & C N/A 02/16/2017   Procedure: DILATATION AND CURETTAGE [place] Glen;  Surgeon: Brayton Mars, MD;  Location: [facility];  Service: Gynecology;  Laterality: N/A;  . JOINT REPLACEMENT    . kidney removal Left   . KIDNEY SURGERY Left 1979   left kidney removed  . LEFT HEART CATH AND CORONARY ANGIOGRAPHY N/A 10/21/2017   Procedure: LEFT HEART CATH AND CORONARY ANGIOGRAPHY;  Surgeon: Teodoro Spray, MD;  Location: [place] CV LAB;  Service: Cardiovascular;  Laterality: N/A;  . POLYPECTOMY  10/31/2019   Procedure: POLYPECTOMY;  Surgeon: Justice Britain  Brooke Bonito., MD;  Location: [place];  Service: Gastroenterology;;  . TOTAL KNEE ARTHROPLASTY Left 07/06/2017   Procedure: TOTAL KNEE ARTHROPLASTY;  Surgeon: Lovell Sheehan, MD;  Location: [facility];  Service: Orthopedics;  Laterality: Left;  . UPPER ESOPHAGEAL ENDOSCOPIC ULTRASOUND (EUS) N/A 10/31/2019   Procedure: UPPER ESOPHAGEAL ENDOSCOPIC ULTRASOUND (EUS);  Surgeon: Irving Copas., MD;  Location: [place];  Service: Gastroenterology;  Laterality: N/A;  . WRIST ARTHROSCOPY Left 1970s  . XI ROBOTIC  ASSISTED VENTRAL HERNIA N/A 12/01/2019   Procedure: XI ROBOTIC ASSISTED VENTRAL HERNIA;  Surgeon: Jules Husbands, MD;  Location: [facility];  Service: General;  Laterality: N/A;    There were no vitals filed for this visit.   Subjective Assessment - 04/24/20 1303    Subjective Patient reports having recent stomach issues along with pain in the side of her head.    Pertinent History s/p TKA 07/06/2017; right knee arthritis with weakness as well with reports of right and left leg "giving way" intermittently; Going to have TKA 6/10    Limitations Walking;Standing;House hold activities;Other (comment)    How long can you sit comfortably? No Limit    How long can you stand comfortably? 2-3 hours    How long can you walk comfortably? 2-3 hours    Patient Stated Goals Improve motion and strength of  hips and R knee and being able to walk with less/no pain.    Currently in Pain? No/denies    Pain Onset More than a month ago          INTERVENTIONS    Therapeutic Exercise -Leg Press x15 85lbs, x15 95lbs  -Standing Hip abduction at hip machine 2x12 55lbs  -Seated Knee Extension at OMEGA 3x15 20lbs  -Standing Heel Raise at Stair 2x15     Performed Exercises to improve LE strength        PT Education - 04/24/20 1303    Education provided Yes    Education Details form/technique of exercise and BP    Person(s) Educated Patient    Methods Explanation;Demonstration    Comprehension Verbalized understanding;Returned demonstration            PT Short Term Goals - 04/04/20 1348      PT SHORT TERM GOAL #1   Title Patient will demonstrate independence with HEP to maximize rehab potential.    Time 3    Period Weeks    Status New    Target Date 04/25/20             PT Long Term Goals - 04/10/20 0926      PT LONG TERM GOAL #1   Title Patient will demonstrate independence with progressive HEP to maintain progress made during therapy.    Time 6    Period Weeks    Status New      PT  LONG TERM GOAL #2   Title Patient will improve FOTO score to 58 to show an improvement in patients ability to perform functional activities.    Baseline 50 at Eval    Time 6    Period Weeks    Status New      PT LONG TERM GOAL #3   Title Patient will have a worst pain of 3/10 to indicate significant improvement with pain and ability to perform functional activities more functionally.     Baseline 10/10    Time 6    Period Weeks    Status New      PT LONG  TERM GOAL #4   Title Patient will be able to descend stairs without pain to show improvement in quad strength and knee stability.    Baseline pain with descending stairs    Time 6    Period Weeks    Status New      PT LONG TERM GOAL #5   Title Patient will improve 2MWT test by 31ft to show improvement in patients aerobic capacity and ability to ambulate out in community.    Baseline 435ft at eval.6    Time 6    Period Weeks    Status New    Target Date 05/16/20                 Plan - 04/24/20 1339    Clinical Impression Statement Monitored patients' vitals at beginning and throughout session which remained in appropriate ranges to perform exercises. Continued to work on improving patients LE strength which patient tolerated well. Patient's muscular endurance continues to be limited demonstrated by needed rest breaks to be able to perform exercises. Patient became lightheaded during session; BP was normal along with HR and SpO2%, contributed to not having much to eat prior to session.  Patient will continue to benefit from skilled therapy to return to prior level of function.    Personal Factors and Comorbidities Age;Comorbidity 2    Comorbidities HBP, Obesity    Examination-Activity Limitations Stairs;Locomotion Level;Stand    Examination-Participation Restrictions Shop    Stability/Clinical Decision Making Evolving/Moderate complexity    Clinical Decision Making Moderate    Rehab Potential Good    Clinical Impairments  Affecting Rehab Potential (+) motivated, chronic condition (-) arthritis right knee with pain    PT Frequency 2x / week    PT Duration 6 weeks    PT Treatment/Interventions Electrical Stimulation;Cryotherapy;Moist Heat;Gait training;Stair training;Therapeutic activities;Therapeutic exercise;Balance training;Patient/family education;Neuromuscular re-education;Manual techniques;Passive range of motion;Dry needling;Spinal Manipulations;Joint Manipulations;Functional mobility training;Traction    PT Next Visit Plan Monitor BP throughout session and continue POC    PT Home Exercise Plan Heel Raises, standing hip abduction    Consulted and Agree with Plan of Care Patient           Patient will benefit from skilled therapeutic intervention in order to improve the following deficits and impairments:  Pain, Decreased activity tolerance, Decreased endurance, Decreased range of motion, Decreased strength, Impaired perceived functional ability, Difficulty walking, Decreased mobility, Obesity  Visit Diagnosis: Chronic pain of right knee  Muscle weakness (generalized)  Pain in left hip     Problem List Patient Active Problem List   Diagnosis Date Noted  . Fever blister 10/15/2019  . Cerumen in auditory canal on examination 10/15/2019  . Bilateral hearing loss 10/15/2019  . Need for vaccination against Streptococcus pneumoniae using pneumococcal conjugate vaccine 13 10/15/2019  . Encounter for screening mammogram for malignant neoplasm of breast 10/15/2019  . Screening for osteoporosis 10/15/2019  . Elevated erythrocyte sedimentation rate 11/02/2018  . Lumbar spondylosis 11/02/2018  . PVC's (premature ventricular contractions) 10/18/2018  . Primary osteoarthritis of right knee 09/15/2018  . Encounter for pre-operative examination 09/15/2018  . Obstructive sleep apnea 09/15/2018  . Calculus of gallbladder without cholecystitis without obstruction 09/15/2018  . Calculus of gallbladder with  cholecystitis without biliary obstruction 01/27/2018  . Restless leg syndrome 01/27/2018  . Generalized abdominal pain 01/11/2018  . Cyst of pancreas 01/11/2018  . Other specified disorders of kidney and ureter 01/11/2018  . Primary insomnia 01/11/2018  . Paroxysmal atrial fibrillation ([place]) 11/12/2017  .  Essential hypertension 10/25/2017  . Mixed hyperlipidemia 10/25/2017  . Low back pain at multiple sites 10/25/2017  . Dysuria 10/25/2017  . Encounter for general adult medical examination with abnormal findings 10/25/2017  . Vitamin D deficiency 10/25/2017  . Near syncope   . Symptomatic bradycardia 10/19/2017  . Carpal tunnel syndrome 10/12/2017  . Primary osteoarthritis of left knee 07/09/2017  . History of total knee arthroplasty 07/06/2017  . Chest pain 06/29/2017  . Postop check 02/25/2017  . Impingement syndrome of shoulder region 01/20/2017  . Neck pain 01/20/2017  . Obesity (BMI 35.0-39.9 without comorbidity) 07/15/2016  . Endometrial polyp 07/15/2016  . Morbid obesity ([place]) 07/15/2016  . Family history of breast cancer in first degree relative 07/15/2016  . Family history of ovarian cancer 07/15/2016  . Knee pain 07/04/2016  . Bilateral leg pain 06/24/2016   1:45 PM, 04/24/20 Margarito Liner, SPT Student Physical Therapist [place]  [phone]  Margarito Liner 04/24/2020, 1:40 PM  [facility] [address] [place], [address] Phone: [phone]   Fax:  [phone]  Name: Whitney [name] MRN: [id number] Date of Birth: 1951-03-07

## 2020-04-26 ENCOUNTER — Other Ambulatory Visit: Payer: Self-pay

## 2020-04-26 ENCOUNTER — Ambulatory Visit: Payer: Medicare Other

## 2020-04-26 DIAGNOSIS — M6281 Muscle weakness (generalized): Secondary | ICD-10-CM | POA: Diagnosis not present

## 2020-04-26 DIAGNOSIS — M25552 Pain in left hip: Secondary | ICD-10-CM | POA: Diagnosis not present

## 2020-04-26 DIAGNOSIS — G8929 Other chronic pain: Secondary | ICD-10-CM

## 2020-04-26 DIAGNOSIS — M25561 Pain in right knee: Secondary | ICD-10-CM | POA: Diagnosis not present

## 2020-04-26 NOTE — Therapy (Signed)
[facility] [address] [place], [address] Phone: [phone]   Fax:  [phone]  Physical Therapy Treatment  Patient Details  Name: Whitney [name] MRN: [id number] Date of Birth: 1950-09-15 Referring Provider (PT): Kurtis Bushman MD   Encounter Date: 04/26/2020   PT End of Session - 04/26/20 1346    Visit Number 6    Number of Visits 13    Date for PT Re-Evaluation 05/16/20    Authorization Type 6/10    PT Start Time 6226    PT Stop Time 1430    PT Time Calculation (min) 45 min    Activity Tolerance Patient tolerated treatment well    Behavior During Therapy [facility] for tasks assessed/performed           Past Medical History:  Diagnosis Date  . Abnormal Pap smear of cervix    Pt states she had colposcopy   . Anxiety   . Arthritis   . Chronic kidney disease    removed left kidney  . Depression   . Dyspnea   . Dysrhythmia   . Hx of unilateral nephrectomy 1979   Left Nephrectomy  . Hypertension   . Lower extremity edema   . Palpitations   . PMB (postmenopausal bleeding)   . Restless leg syndrome   . Sleep apnea    has no cpap  . Vitamin D deficiency     Past Surgical History:  Procedure Laterality Date  . BIOPSY  10/31/2019   Procedure: BIOPSY;  Surgeon: Rush Landmark Telford Nab., MD;  Location: [place];  Service: Gastroenterology;;  . CARDIAC CATHETERIZATION    . CHOLECYSTECTOMY    . COLONOSCOPY WITH PROPOFOL N/A 06/13/2019   Procedure: COLONOSCOPY WITH PROPOFOL;  Surgeon: [name], MD;  Location: [facility] ENDOSCOPY;  Service: Gastroenterology;  Laterality: N/A;  . COLONOSCOPY WITH PROPOFOL N/A 08/12/2019   Procedure: COLONOSCOPY WITH PROPOFOL;  Surgeon: [name], MD;  Location: [facility] ENDOSCOPY;  Service: Gastroenterology;  Laterality: N/A;  . COLONOSCOPY WITH PROPOFOL N/A 09/19/2019   Procedure: COLONOSCOPY WITH PROPOFOL;  Surgeon: [name], MD;  Location: [facility] ENDOSCOPY;  Service:  Gastroenterology;  Laterality: N/A;  . DILATION AND CURETTAGE OF UTERUS    . ESOPHAGOGASTRODUODENOSCOPY (EGD) WITH PROPOFOL N/A 06/13/2019   Procedure: ESOPHAGOGASTRODUODENOSCOPY (EGD) WITH PROPOFOL;  Surgeon: [name], MD;  Location: [facility] ENDOSCOPY;  Service: Gastroenterology;  Laterality: N/A;  Pt will go for COVID test on 33-3-54 as she uses public transportation and will be in the area on that day.   . ESOPHAGOGASTRODUODENOSCOPY (EGD) WITH PROPOFOL N/A 10/31/2019   Procedure: ESOPHAGOGASTRODUODENOSCOPY (EGD) WITH PROPOFOL;  Surgeon: Rush Landmark Telford Nab., MD;  Location: [place];  Service: Gastroenterology;  Laterality: N/A;  . HEMOSTASIS CLIP PLACEMENT  10/31/2019   Procedure: HEMOSTASIS CLIP PLACEMENT;  Surgeon: Irving Copas., MD;  Location: [place];  Service: Gastroenterology;;  . HYSTEROSCOPY WITH D & C N/A 02/16/2017   Procedure: DILATATION AND CURETTAGE Pol[place] Glen;  Surgeon: Brayton Mars, MD;  Location: [facility];  Service: Gynecology;  Laterality: N/A;  . JOINT REPLACEMENT    . kidney removal Left   . KIDNEY SURGERY Left 1979   left kidney removed  . LEFT HEART CATH AND CORONARY ANGIOGRAPHY N/A 10/21/2017   Procedure: LEFT HEART CATH AND CORONARY ANGIOGRAPHY;  Surgeon: Teodoro Spray, MD;  Location: [place] CV LAB;  Service: Cardiovascular;  Laterality: N/A;  . POLYPECTOMY  10/31/2019   Procedure: POLYPECTOMY;  Surgeon: Justice Britain  Brooke Bonito., MD;  Location: [place];  Service: Gastroenterology;;  . TOTAL KNEE ARTHROPLASTY Left 07/06/2017   Procedure: TOTAL KNEE ARTHROPLASTY;  Surgeon: Lovell Sheehan, MD;  Location: [facility];  Service: Orthopedics;  Laterality: Left;  . UPPER ESOPHAGEAL ENDOSCOPIC ULTRASOUND (EUS) N/A 10/31/2019   Procedure: UPPER ESOPHAGEAL ENDOSCOPIC ULTRASOUND (EUS);  Surgeon: Irving Copas., MD;  Location: [place];  Service: Gastroenterology;  Laterality: N/A;  . WRIST ARTHROSCOPY Left 1970s  . XI ROBOTIC  ASSISTED VENTRAL HERNIA N/A 12/01/2019   Procedure: XI ROBOTIC ASSISTED VENTRAL HERNIA;  Surgeon: Jules Husbands, MD;  Location: [facility];  Service: General;  Laterality: N/A;    There were no vitals filed for this visit.   Subjective Assessment - 04/26/20 1345    Subjective Patient reports no issues at todays session and is feeling better at today'session.    Pertinent History s/p TKA 07/06/2017; right knee arthritis with weakness as well with reports of right and left leg "giving way" intermittently; Going to have TKA 6/10    Limitations Walking;Standing;House hold activities;Other (comment)    How long can you sit comfortably? No Limit    How long can you stand comfortably? 2-3 hours    How long can you walk comfortably? 2-3 hours    Patient Stated Goals Improve motion and strength of  hips and R knee and being able to walk with less/no pain.    Currently in Pain? No/denies    Pain Onset More than a month ago           INTERVENTIONS    Therapeutic Exercise -Total Gym Level 22 x20, Level 24 x20, Level 26 x20   -Step-up 8inch Step x12 B  -Seated Knee Extension at OMEGA 3x15 20lbs  -Seated Leg Curl at [facility] 3x15 35lbs     Performed Exercises to improve LE strength      PT Education - 04/26/20 1346    Education provided Yes    Education Details form/technique of exercise and BP    Person(s) Educated Patient    Methods Explanation;Demonstration    Comprehension Verbalized understanding;Returned demonstration            PT Short Term Goals - 04/04/20 1348      PT SHORT TERM GOAL #1   Title Patient will demonstrate independence with HEP to maximize rehab potential.    Time 3    Period Weeks    Status New    Target Date 04/25/20             PT Long Term Goals - 04/10/20 0926      PT LONG TERM GOAL #1   Title Patient will demonstrate independence with progressive HEP to maintain progress made during therapy.    Time 6    Period Weeks    Status New      PT LONG  TERM GOAL #2   Title Patient will improve FOTO score to 58 to show an improvement in patients ability to perform functional activities.    Baseline 50 at Eval    Time 6    Period Weeks    Status New      PT LONG TERM GOAL #3   Title Patient will have a worst pain of 3/10 to indicate significant improvement with pain and ability to perform functional activities more functionally.     Baseline 10/10    Time 6    Period Weeks    Status New      PT  LONG TERM GOAL #4   Title Patient will be able to descend stairs without pain to show improvement in quad strength and knee stability.    Baseline pain with descending stairs    Time 6    Period Weeks    Status New      PT LONG TERM GOAL #5   Title Patient will improve 2MWT test by 21ft to show improvement in patients aerobic capacity and ability to ambulate out in community.    Baseline 426ft at eval.6    Time 6    Period Weeks    Status New    Target Date 05/16/20                 Plan - 04/26/20 1424    Clinical Impression Statement Continued to monitor patients BP at beginning and during session due to patient's history of HBP.  Patient BP remained within appropriate ranges this session, however, had an episode of dizziness. Exercises were stopped, BP was reassessed, and light exercises were performed for the remainder of the session.  Continued to focus on improving patients LE strength during session.  Patient tolerated exercises well with minimum cueing needed to perform correctly. Patient had mild SOB after exercises which required ~1-2-minute rest break to recover.  Patient will benefit from skilled therapy to return to prior level of function.    Personal Factors and Comorbidities Age;Comorbidity 2    Comorbidities HBP, Obesity    Examination-Activity Limitations Stairs;Locomotion Level;Stand    Examination-Participation Restrictions Shop    Stability/Clinical Decision Making Evolving/Moderate complexity    Clinical  Decision Making Moderate    Rehab Potential Good    Clinical Impairments Affecting Rehab Potential (+) motivated, chronic condition (-) arthritis right knee with pain    PT Frequency 2x / week    PT Duration 6 weeks    PT Treatment/Interventions Electrical Stimulation;Cryotherapy;Moist Heat;Gait training;Stair training;Therapeutic activities;Therapeutic exercise;Balance training;Patient/family education;Neuromuscular re-education;Manual techniques;Passive range of motion;Dry needling;Spinal Manipulations;Joint Manipulations;Functional mobility training;Traction    PT Next Visit Plan Monitor BP throughout session and continue POC    PT Home Exercise Plan Heel Raises, standing hip abduction    Consulted and Agree with Plan of Care Patient           Patient will benefit from skilled therapeutic intervention in order to improve the following deficits and impairments:  Pain, Decreased activity tolerance, Decreased endurance, Decreased range of motion, Decreased strength, Impaired perceived functional ability, Difficulty walking, Decreased mobility, Obesity  Visit Diagnosis: Chronic pain of right knee  Muscle weakness (generalized)  Pain in left hip     Problem List Patient Active Problem List   Diagnosis Date Noted  . Fever blister 10/15/2019  . Cerumen in auditory canal on examination 10/15/2019  . Bilateral hearing loss 10/15/2019  . Need for vaccination against Streptococcus pneumoniae using pneumococcal conjugate vaccine 13 10/15/2019  . Encounter for screening mammogram for malignant neoplasm of breast 10/15/2019  . Screening for osteoporosis 10/15/2019  . Elevated erythrocyte sedimentation rate 11/02/2018  . Lumbar spondylosis 11/02/2018  . PVC's (premature ventricular contractions) 10/18/2018  . Primary osteoarthritis of right knee 09/15/2018  . Encounter for pre-operative examination 09/15/2018  . Obstructive sleep apnea 09/15/2018  . Calculus of gallbladder without  cholecystitis without obstruction 09/15/2018  . Calculus of gallbladder with cholecystitis without biliary obstruction 01/27/2018  . Restless leg syndrome 01/27/2018  . Generalized abdominal pain 01/11/2018  . Cyst of pancreas 01/11/2018  . Other specified disorders of kidney and ureter 01/11/2018  .  Primary insomnia 01/11/2018  . Paroxysmal atrial fibrillation ([place]) 11/12/2017  . Essential hypertension 10/25/2017  . Mixed hyperlipidemia 10/25/2017  . Low back pain at multiple sites 10/25/2017  . Dysuria 10/25/2017  . Encounter for general adult medical examination with abnormal findings 10/25/2017  . Vitamin D deficiency 10/25/2017  . Near syncope   . Symptomatic bradycardia 10/19/2017  . Carpal tunnel syndrome 10/12/2017  . Primary osteoarthritis of left knee 07/09/2017  . History of total knee arthroplasty 07/06/2017  . Chest pain 06/29/2017  . Postop check 02/25/2017  . Impingement syndrome of shoulder region 01/20/2017  . Neck pain 01/20/2017  . Obesity (BMI 35.0-39.9 without comorbidity) 07/15/2016  . Endometrial polyp 07/15/2016  . Morbid obesity ([place]) 07/15/2016  . Family history of breast cancer in first degree relative 07/15/2016  . Family history of ovarian cancer 07/15/2016  . Knee pain 07/04/2016  . Bilateral leg pain 06/24/2016   2:30 PM, 04/26/20 Margarito Liner, SPT Student Physical Therapist [place]  [phone]  Margarito Liner 04/26/2020, 2:26 PM  [facility] [address], [place], [address] Phone: [phone]   Fax:  [phone]  Name: ROE KOFFMAN MRN: [id number] Date of Birth: 01-20-1951

## 2020-04-30 ENCOUNTER — Ambulatory Visit: Payer: Medicare Other

## 2020-04-30 ENCOUNTER — Other Ambulatory Visit: Payer: Self-pay

## 2020-04-30 DIAGNOSIS — G8929 Other chronic pain: Secondary | ICD-10-CM

## 2020-04-30 DIAGNOSIS — M6281 Muscle weakness (generalized): Secondary | ICD-10-CM | POA: Diagnosis not present

## 2020-04-30 DIAGNOSIS — M25552 Pain in left hip: Secondary | ICD-10-CM | POA: Diagnosis not present

## 2020-04-30 DIAGNOSIS — M25561 Pain in right knee: Secondary | ICD-10-CM | POA: Diagnosis not present

## 2020-04-30 NOTE — Therapy (Signed)
[place] [facility] [address], [place], [address] Phone: [phone]   Fax:  [phone]  Physical Therapy Treatment  Patient Details  Name: Whitney [name] MRN: [id number] Date of Birth: 11-06-1950 Referring Provider (PT): Kurtis Bushman MD   Encounter Date: 04/30/2020   PT End of Session - 04/30/20 1351    Visit Number 7    Number of Visits 13    Date for PT Re-Evaluation 05/16/20    Authorization Type 7/10    PT Start Time 1350    PT Stop Time 1430    PT Time Calculation (min) 40 min    Activity Tolerance Patient tolerated treatment well    Behavior During Therapy [facility] for tasks assessed/performed           Past Medical History:  Diagnosis Date  . Abnormal Pap smear of cervix    Pt states she had colposcopy   . Anxiety   . Arthritis   . Chronic kidney disease    removed left kidney  . Depression   . Dyspnea   . Dysrhythmia   . Hx of unilateral nephrectomy 1979   Left Nephrectomy  . Hypertension   . Lower extremity edema   . Palpitations   . PMB (postmenopausal bleeding)   . Restless leg syndrome   . Sleep apnea    has no cpap  . Vitamin D deficiency     Past Surgical History:  Procedure Laterality Date  . BIOPSY  10/31/2019   Procedure: BIOPSY;  Surgeon: Rush Landmark Telford Nab., MD;  Location: [place];  Service: Gastroenterology;;  . CARDIAC CATHETERIZATION    . CHOLECYSTECTOMY    . COLONOSCOPY WITH PROPOFOL N/A 06/13/2019   Procedure: COLONOSCOPY WITH PROPOFOL;  Surgeon: [name], MD;  Location: [facility] ENDOSCOPY;  Service: Gastroenterology;  Laterality: N/A;  . COLONOSCOPY WITH PROPOFOL N/A 08/12/2019   Procedure: COLONOSCOPY WITH PROPOFOL;  Surgeon: [name], MD;  Location: [facility] ENDOSCOPY;  Service: Gastroenterology;  Laterality: N/A;  . COLONOSCOPY WITH PROPOFOL N/A 09/19/2019   Procedure: COLONOSCOPY WITH PROPOFOL;  Surgeon: [name], MD;  Location: [facility] ENDOSCOPY;  Service:  Gastroenterology;  Laterality: N/A;  . DILATION AND CURETTAGE OF UTERUS    . ESOPHAGOGASTRODUODENOSCOPY (EGD) WITH PROPOFOL N/A 06/13/2019   Procedure: ESOPHAGOGASTRODUODENOSCOPY (EGD) WITH PROPOFOL;  Surgeon: [name], MD;  Location: [facility] ENDOSCOPY;  Service: Gastroenterology;  Laterality: N/A;  Pt will go for COVID test on 24-4-01 as she uses public transportation and will be in the area on that day.   . ESOPHAGOGASTRODUODENOSCOPY (EGD) WITH PROPOFOL N/A 10/31/2019   Procedure: ESOPHAGOGASTRODUODENOSCOPY (EGD) WITH PROPOFOL;  Surgeon: Rush Landmark Telford Nab., MD;  Location: [place];  Service: Gastroenterology;  Laterality: N/A;  . HEMOSTASIS CLIP PLACEMENT  10/31/2019   Procedure: HEMOSTASIS CLIP PLACEMENT;  Surgeon: Irving Copas., MD;  Location: [place];  Service: Gastroenterology;;  . HYSTEROSCOPY WITH D & C N/A 02/16/2017   Procedure: DILATATION AND CURETTAGE [place];  Surgeon: Brayton Mars, MD;  Location: [facility];  Service: Gynecology;  Laterality: N/A;  . JOINT REPLACEMENT    . kidney removal Left   . KIDNEY SURGERY Left 1979   left kidney removed  . LEFT HEART CATH AND CORONARY ANGIOGRAPHY N/A 10/21/2017   Procedure: LEFT HEART CATH AND CORONARY ANGIOGRAPHY;  Surgeon: Teodoro Spray, MD;  Location: [place] CV LAB;  Service: Cardiovascular;  Laterality: N/A;  . POLYPECTOMY  10/31/2019   Procedure: POLYPECTOMY;  Surgeon: Justice Britain  Brooke Bonito., MD;  Location: [place];  Service: Gastroenterology;;  . TOTAL KNEE ARTHROPLASTY Left 07/06/2017   Procedure: TOTAL KNEE ARTHROPLASTY;  Surgeon: Lovell Sheehan, MD;  Location: [facility];  Service: Orthopedics;  Laterality: Left;  . UPPER ESOPHAGEAL ENDOSCOPIC ULTRASOUND (EUS) N/A 10/31/2019   Procedure: UPPER ESOPHAGEAL ENDOSCOPIC ULTRASOUND (EUS);  Surgeon: Irving Copas., MD;  Location: [place];  Service: Gastroenterology;  Laterality: N/A;  . WRIST ARTHROSCOPY Left 1970s  . XI ROBOTIC  ASSISTED VENTRAL HERNIA N/A 12/01/2019   Procedure: XI ROBOTIC ASSISTED VENTRAL HERNIA;  Surgeon: Jules Husbands, MD;  Location: [facility];  Service: General;  Laterality: N/A;    There were no vitals filed for this visit.   Subjective Assessment - 04/30/20 1350    Subjective Patient reports reports shes feeling great today.    Pertinent History s/p TKA 07/06/2017; right knee arthritis with weakness as well with reports of right and left leg "giving way" intermittently; Going to have TKA 6/10    Limitations Walking;Standing;House hold activities;Other (comment)    How long can you sit comfortably? No Limit    How long can you stand comfortably? 2-3 hours    How long can you walk comfortably? 2-3 hours    Patient Stated Goals Improve motion and strength of  hips and R knee and being able to walk with less/no pain.    Currently in Pain? No/denies    Pain Onset More than a month ago            INTERVENTIONS    Therapeutic Exercise -Total Gym Level 25 x20, Level 26 2x20   -Lunge with UE Support  x10 B  -Seated Knee Extension at [place] 3x15 25lbs  -Seated Leg Curl at [place] 3x15 35lbs   -Heel Raises x15 with UE Support    Performed Exercises to improve LE strength         PT Education - 04/30/20 1351    Education provided Yes    Education Details form/technique of exercise and BP    Person(s) Educated Patient    Methods Explanation;Demonstration    Comprehension Verbalized understanding;Returned demonstration            PT Short Term Goals - 04/04/20 1348      PT SHORT TERM GOAL #1   Title Patient will demonstrate independence with HEP to maximize rehab potential.    Time 3    Period Weeks    Status New    Target Date 04/25/20             PT Long Term Goals - 04/10/20 0926      PT LONG TERM GOAL #1   Title Patient will demonstrate independence with progressive HEP to maintain progress made during therapy.    Time 6    Period Weeks    Status New      PT LONG  TERM GOAL #2   Title Patient will improve FOTO score to 58 to show an improvement in patients ability to perform functional activities.    Baseline 50 at Eval    Time 6    Period Weeks    Status New      PT LONG TERM GOAL #3   Title Patient will have a worst pain of 3/10 to indicate significant improvement with pain and ability to perform functional activities more functionally.     Baseline 10/10    Time 6    Period Weeks    Status New  PT LONG TERM GOAL #4   Title Patient will be able to descend stairs without pain to show improvement in quad strength and knee stability.    Baseline pain with descending stairs    Time 6    Period Weeks    Status New      PT LONG TERM GOAL #5   Title Patient will improve 2MWT test by 33ft to show improvement in patients aerobic capacity and ability to ambulate out in community.    Baseline 425ft at eval.6    Time 6    Period Weeks    Status New    Target Date 05/16/20                 Plan - 04/30/20 1429    Clinical Impression Statement Patient's vitals remained within appropriate range to perform exercises during today's session.  Patient tolerated exercises well without having dizziness or feeling lightheaded. Progressed patient's resistance with mild fatigue during session due to progression.  Patient is making progress towards goals and will benefit from skilled therapy to continue to address remaining limitations and return to prior level of function.    Personal Factors and Comorbidities Age;Comorbidity 2    Comorbidities HBP, Obesity    Examination-Activity Limitations Stairs;Locomotion Level;Stand    Examination-Participation Restrictions Shop    Stability/Clinical Decision Making Evolving/Moderate complexity    Clinical Decision Making Moderate    Rehab Potential Good    Clinical Impairments Affecting Rehab Potential (+) motivated, chronic condition (-) arthritis right knee with pain    PT Frequency 2x / week    PT  Duration 6 weeks    PT Treatment/Interventions Electrical Stimulation;Cryotherapy;Moist Heat;Gait training;Stair training;Therapeutic activities;Therapeutic exercise;Balance training;Patient/family education;Neuromuscular re-education;Manual techniques;Passive range of motion;Dry needling;Spinal Manipulations;Joint Manipulations;Functional mobility training;Traction    PT Next Visit Plan Monitor BP throughout session and continue POC    PT Home Exercise Plan Heel Raises, standing hip abduction    Consulted and Agree with Plan of Care Patient           Patient will benefit from skilled therapeutic intervention in order to improve the following deficits and impairments:  Pain, Decreased activity tolerance, Decreased endurance, Decreased range of motion, Decreased strength, Impaired perceived functional ability, Difficulty walking, Decreased mobility, Obesity  Visit Diagnosis: Chronic pain of right knee  Muscle weakness (generalized)  Pain in left hip     Problem List Patient Active Problem List   Diagnosis Date Noted  . Fever blister 10/15/2019  . Cerumen in auditory canal on examination 10/15/2019  . Bilateral hearing loss 10/15/2019  . Need for vaccination against Streptococcus pneumoniae using pneumococcal conjugate vaccine 13 10/15/2019  . Encounter for screening mammogram for malignant neoplasm of breast 10/15/2019  . Screening for osteoporosis 10/15/2019  . Elevated erythrocyte sedimentation rate 11/02/2018  . Lumbar spondylosis 11/02/2018  . PVC's (premature ventricular contractions) 10/18/2018  . Primary osteoarthritis of right knee 09/15/2018  . Encounter for pre-operative examination 09/15/2018  . Obstructive sleep apnea 09/15/2018  . Calculus of gallbladder without cholecystitis without obstruction 09/15/2018  . Calculus of gallbladder with cholecystitis without biliary obstruction 01/27/2018  . Restless leg syndrome 01/27/2018  . Generalized abdominal pain 01/11/2018   . Cyst of pancreas 01/11/2018  . Other specified disorders of kidney and ureter 01/11/2018  . Primary insomnia 01/11/2018  . Paroxysmal atrial fibrillation ([place]) 11/12/2017  . Essential hypertension 10/25/2017  . Mixed hyperlipidemia 10/25/2017  . Low back pain at multiple sites 10/25/2017  . Dysuria 10/25/2017  .  Encounter for general adult medical examination with abnormal findings 10/25/2017  . Vitamin D deficiency 10/25/2017  . Near syncope   . Symptomatic bradycardia 10/19/2017  . Carpal tunnel syndrome 10/12/2017  . Primary osteoarthritis of left knee 07/09/2017  . History of total knee arthroplasty 07/06/2017  . Chest pain 06/29/2017  . Postop check 02/25/2017  . Impingement syndrome of shoulder region 01/20/2017  . Neck pain 01/20/2017  . Obesity (BMI 35.0-39.9 without comorbidity) 07/15/2016  . Endometrial polyp 07/15/2016  . Morbid obesity ([place]) 07/15/2016  . Family history of breast cancer in first degree relative 07/15/2016  . Family history of ovarian cancer 07/15/2016  . Knee pain 07/04/2016  . Bilateral leg pain 06/24/2016   2:38 PM, 04/30/20 Margarito Liner, SPT Student Physical Therapist [place]  [phone]  Margarito Liner 04/30/2020, 2:36 PM  [place] [facility] [address], [place], [address] Phone: [phone]   Fax:  [phone]  Name: Whitney [name] MRN: [id number] Date of Birth: 1951-07-16

## 2020-04-30 NOTE — Therapy (Deleted)
[place] [facility] [address], [place], [address] Phone: [phone]   Fax:  [phone]  Physical Therapy Treatment  Patient Details  Name: Whitney [name] MRN: [id number] Date of Birth: Dec 13, 1950 Referring Provider (PT): Kurtis Bushman MD   Encounter Date: 04/30/2020   PT End of Session - 04/30/20 1351    Visit Number 7    Number of Visits 13    Date for PT Re-Evaluation 05/16/20    Authorization Type 7/10    PT Start Time 1350    PT Stop Time 1430    PT Time Calculation (min) 40 min    Activity Tolerance Patient tolerated treatment well    Behavior During Therapy [facility] for tasks assessed/performed           Past Medical History:  Diagnosis Date  . Abnormal Pap smear of cervix    Pt states she had colposcopy   . Anxiety   . Arthritis   . Chronic kidney disease    removed left kidney  . Depression   . Dyspnea   . Dysrhythmia   . Hx of unilateral nephrectomy 1979   Left Nephrectomy  . Hypertension   . Lower extremity edema   . Palpitations   . PMB (postmenopausal bleeding)   . Restless leg syndrome   . Sleep apnea    has no cpap  . Vitamin D deficiency     Past Surgical History:  Procedure Laterality Date  . BIOPSY  10/31/2019   Procedure: BIOPSY;  Surgeon: Rush Landmark Telford Nab., MD;  Location: [place];  Service: Gastroenterology;;  . CARDIAC CATHETERIZATION    . CHOLECYSTECTOMY    . COLONOSCOPY WITH PROPOFOL N/A 06/13/2019   Procedure: COLONOSCOPY WITH PROPOFOL;  Surgeon: [name], MD;  Location: [facility] ENDOSCOPY;  Service: Gastroenterology;  Laterality: N/A;  . COLONOSCOPY WITH PROPOFOL N/A 08/12/2019   Procedure: COLONOSCOPY WITH PROPOFOL;  Surgeon: [name], MD;  Location: [facility] ENDOSCOPY;  Service: Gastroenterology;  Laterality: N/A;  . COLONOSCOPY WITH PROPOFOL N/A 09/19/2019   Procedure: COLONOSCOPY WITH PROPOFOL;  Surgeon: [name], MD;  Location: [facility] ENDOSCOPY;  Service:  Gastroenterology;  Laterality: N/A;  . DILATION AND CURETTAGE OF UTERUS    . ESOPHAGOGASTRODUODENOSCOPY (EGD) WITH PROPOFOL N/A 06/13/2019   Procedure: ESOPHAGOGASTRODUODENOSCOPY (EGD) WITH PROPOFOL;  Surgeon: [name], MD;  Location: [facility] ENDOSCOPY;  Service: Gastroenterology;  Laterality: N/A;  Pt will go for COVID test on 47-4-25 as she uses public transportation and will be in the area on that day.   . ESOPHAGOGASTRODUODENOSCOPY (EGD) WITH PROPOFOL N/A 10/31/2019   Procedure: ESOPHAGOGASTRODUODENOSCOPY (EGD) WITH PROPOFOL;  Surgeon: Rush Landmark Telford Nab., MD;  Location: [place];  Service: Gastroenterology;  Laterality: N/A;  . HEMOSTASIS CLIP PLACEMENT  10/31/2019   Procedure: HEMOSTASIS CLIP PLACEMENT;  Surgeon: Irving Copas., MD;  Location: [place];  Service: Gastroenterology;;  . HYSTEROSCOPY WITH D & C N/A 02/16/2017   Procedure: DILATATION AND CURETTAGE [place];  Surgeon: Brayton Mars, MD;  Location: [facility];  Service: Gynecology;  Laterality: N/A;  . JOINT REPLACEMENT    . kidney removal Left   . KIDNEY SURGERY Left 1979   left kidney removed  . LEFT HEART CATH AND CORONARY ANGIOGRAPHY N/A 10/21/2017   Procedure: LEFT HEART CATH AND CORONARY ANGIOGRAPHY;  Surgeon: Teodoro Spray, MD;  Location: [place] CV LAB;  Service: Cardiovascular;  Laterality: N/A;  . POLYPECTOMY  10/31/2019   Procedure: POLYPECTOMY;  Surgeon: Justice Britain  Brooke Bonito., MD;  Location: [place];  Service: Gastroenterology;;  . TOTAL KNEE ARTHROPLASTY Left 07/06/2017   Procedure: TOTAL KNEE ARTHROPLASTY;  Surgeon: Lovell Sheehan, MD;  Location: [facility];  Service: Orthopedics;  Laterality: Left;  . UPPER ESOPHAGEAL ENDOSCOPIC ULTRASOUND (EUS) N/A 10/31/2019   Procedure: UPPER ESOPHAGEAL ENDOSCOPIC ULTRASOUND (EUS);  Surgeon: Irving Copas., MD;  Location: [place];  Service: Gastroenterology;  Laterality: N/A;  . WRIST ARTHROSCOPY Left 1970s  . XI ROBOTIC  ASSISTED VENTRAL HERNIA N/A 12/01/2019   Procedure: XI ROBOTIC ASSISTED VENTRAL HERNIA;  Surgeon: Jules Husbands, MD;  Location: [facility];  Service: General;  Laterality: N/A;    There were no vitals filed for this visit.   Subjective Assessment - 04/30/20 1350    Subjective Patient reports reports shes feeling great today.    Pertinent History s/p TKA 07/06/2017; right knee arthritis with weakness as well with reports of right and left leg "giving way" intermittently; Going to have TKA 6/10    Limitations Walking;Standing;House hold activities;Other (comment)    How long can you sit comfortably? No Limit    How long can you stand comfortably? 2-3 hours    How long can you walk comfortably? 2-3 hours    Patient Stated Goals Improve motion and strength of  hips and R knee and being able to walk with less/no pain.    Currently in Pain? No/denies    Pain Onset More than a month ago              PT Education - 04/30/20 1351    Education provided Yes    Education Details form/technique of exercise and BP    Person(s) Educated Patient    Methods Explanation;Demonstration    Comprehension Verbalized understanding;Returned demonstration            PT Short Term Goals - 04/04/20 1348      PT SHORT TERM GOAL #1   Title Patient will demonstrate independence with HEP to maximize rehab potential.    Time 3    Period Weeks    Status New    Target Date 04/25/20             PT Long Term Goals - 04/10/20 0926      PT LONG TERM GOAL #1   Title Patient will demonstrate independence with progressive HEP to maintain progress made during therapy.    Time 6    Period Weeks    Status New      PT LONG TERM GOAL #2   Title Patient will improve FOTO score to 58 to show an improvement in patients ability to perform functional activities.    Baseline 50 at Eval    Time 6    Period Weeks    Status New      PT LONG TERM GOAL #3   Title Patient will have a worst pain of 3/10 to indicate  significant improvement with pain and ability to perform functional activities more functionally.     Baseline 10/10    Time 6    Period Weeks    Status New      PT LONG TERM GOAL #4   Title Patient will be able to descend stairs without pain to show improvement in quad strength and knee stability.    Baseline pain with descending stairs    Time 6    Period Weeks    Status New      PT LONG TERM GOAL #5  Title Patient will improve 2MWT test by 50ft to show improvement in patients aerobic capacity and ability to ambulate out in community.    Baseline 440ft at eval.6    Time 6    Period Weeks    Status New    Target Date 05/16/20                 Plan - 04/30/20 1429    Clinical Impression Statement Patient's vitals remained within appropriate range to perform exercises during today's session.  Patient tolerated exercises well without having dizziness or feeling lightheaded. Progressed patient's resistance with mild fatigue during session due to progression.  Patient is making progress towards goals and will benefit from skilled therapy to continue to address remaining limitations and return to prior level of function.    Personal Factors and Comorbidities Age;Comorbidity 2    Comorbidities HBP, Obesity    Examination-Activity Limitations Stairs;Locomotion Level;Stand    Examination-Participation Restrictions Shop    Stability/Clinical Decision Making Evolving/Moderate complexity    Clinical Decision Making Moderate    Rehab Potential Good    Clinical Impairments Affecting Rehab Potential (+) motivated, chronic condition (-) arthritis right knee with pain    PT Frequency 2x / week    PT Duration 6 weeks    PT Treatment/Interventions Electrical Stimulation;Cryotherapy;Moist Heat;Gait training;Stair training;Therapeutic activities;Therapeutic exercise;Balance training;Patient/family education;Neuromuscular re-education;Manual techniques;Passive range of motion;Dry needling;Spinal  Manipulations;Joint Manipulations;Functional mobility training;Traction    PT Next Visit Plan Monitor BP throughout session and continue POC    PT Home Exercise Plan Heel Raises, standing hip abduction    Consulted and Agree with Plan of Care Patient           Patient will benefit from skilled therapeutic intervention in order to improve the following deficits and impairments:  Pain, Decreased activity tolerance, Decreased endurance, Decreased range of motion, Decreased strength, Impaired perceived functional ability, Difficulty walking, Decreased mobility, Obesity  Visit Diagnosis: Chronic pain of right knee  Muscle weakness (generalized)  Pain in left hip     Problem List Patient Active Problem List   Diagnosis Date Noted  . Fever blister 10/15/2019  . Cerumen in auditory canal on examination 10/15/2019  . Bilateral hearing loss 10/15/2019  . Need for vaccination against Streptococcus pneumoniae using pneumococcal conjugate vaccine 13 10/15/2019  . Encounter for screening mammogram for malignant neoplasm of breast 10/15/2019  . Screening for osteoporosis 10/15/2019  . Elevated erythrocyte sedimentation rate 11/02/2018  . Lumbar spondylosis 11/02/2018  . PVC's (premature ventricular contractions) 10/18/2018  . Primary osteoarthritis of right knee 09/15/2018  . Encounter for pre-operative examination 09/15/2018  . Obstructive sleep apnea 09/15/2018  . Calculus of gallbladder without cholecystitis without obstruction 09/15/2018  . Calculus of gallbladder with cholecystitis without biliary obstruction 01/27/2018  . Restless leg syndrome 01/27/2018  . Generalized abdominal pain 01/11/2018  . Cyst of pancreas 01/11/2018  . Other specified disorders of kidney and ureter 01/11/2018  . Primary insomnia 01/11/2018  . Paroxysmal atrial fibrillation ([place]) 11/12/2017  . Essential hypertension 10/25/2017  . Mixed hyperlipidemia 10/25/2017  . Low back pain at multiple sites  10/25/2017  . Dysuria 10/25/2017  . Encounter for general adult medical examination with abnormal findings 10/25/2017  . Vitamin D deficiency 10/25/2017  . Near syncope   . Symptomatic bradycardia 10/19/2017  . Carpal tunnel syndrome 10/12/2017  . Primary osteoarthritis of left knee 07/09/2017  . History of total knee arthroplasty 07/06/2017  . Chest pain 06/29/2017  . Postop check 02/25/2017  . Impingement  syndrome of shoulder region 01/20/2017  . Neck pain 01/20/2017  . Obesity (BMI 35.0-39.9 without comorbidity) 07/15/2016  . Endometrial polyp 07/15/2016  . Morbid obesity ([place]) 07/15/2016  . Family history of breast cancer in first degree relative 07/15/2016  . Family history of ovarian cancer 07/15/2016  . Knee pain 07/04/2016  . Bilateral leg pain 06/24/2016    Margarito Liner 04/30/2020, 2:33 PM  [place] [facility] [address], [place], [address] Phone: [phone]   Fax:  [phone]  Name: Whitney [name] MRN: [id number] Date of Birth: [birth year]

## 2020-05-01 ENCOUNTER — Telehealth: Payer: Self-pay | Admitting: Gastroenterology

## 2020-05-01 ENCOUNTER — Other Ambulatory Visit: Payer: Self-pay

## 2020-05-01 ENCOUNTER — Ambulatory Visit: Payer: Medicare Other

## 2020-05-01 DIAGNOSIS — K31A0 Gastric intestinal metaplasia, unspecified: Secondary | ICD-10-CM

## 2020-05-01 DIAGNOSIS — M6281 Muscle weakness (generalized): Secondary | ICD-10-CM

## 2020-05-01 DIAGNOSIS — K635 Polyp of colon: Secondary | ICD-10-CM

## 2020-05-01 DIAGNOSIS — M25552 Pain in left hip: Secondary | ICD-10-CM

## 2020-05-01 DIAGNOSIS — G8929 Other chronic pain: Secondary | ICD-10-CM

## 2020-05-01 MED ORDER — PEG 3350-KCL-NABCB-NACL-NASULF 236 G PO SOLR: ORAL | 0 refills | Status: DC

## 2020-05-01 NOTE — Telephone Encounter (Signed)
Patient had ov on 7.14.21 with Dr. Vicente Males and was told to have repeat colonoscopy and EGD for evaluate intestinal metaplasia. Please call pt to schedule procedures. Pt not wanting Oct 19.

## 2020-05-01 NOTE — Therapy (Signed)
[place] [address] [place], [address] Phone: [phone]   Fax:  [phone]  Physical Therapy Treatment  Patient Details  Name: Whitney [name] MRN: [id number] Date of Birth: 02-19-1951 Referring Provider (PT): Kurtis Bushman MD   Encounter Date: 05/01/2020   PT End of Session - 05/01/20 1308    Visit Number 8    Number of Visits 13    Date for PT Re-Evaluation 05/16/20    Authorization Type 8/10    PT Start Time 1300    PT Stop Time 1340    PT Time Calculation (min) 40 min    Activity Tolerance Patient tolerated treatment well    Behavior During Therapy [facility] for tasks assessed/performed           Past Medical History:  Diagnosis Date  . Abnormal Pap smear of cervix    Pt states she had colposcopy   . Anxiety   . Arthritis   . Chronic kidney disease    removed left kidney  . Depression   . Dyspnea   . Dysrhythmia   . Hx of unilateral nephrectomy 1979   Left Nephrectomy  . Hypertension   . Lower extremity edema   . Palpitations   . PMB (postmenopausal bleeding)   . Restless leg syndrome   . Sleep apnea    has no cpap  . Vitamin D deficiency     Past Surgical History:  Procedure Laterality Date  . BIOPSY  10/31/2019   Procedure: BIOPSY;  Surgeon: Rush Landmark Telford Nab., MD;  Location: [place];  Service: Gastroenterology;;  . CARDIAC CATHETERIZATION    . CHOLECYSTECTOMY    . COLONOSCOPY WITH PROPOFOL N/A 06/13/2019   Procedure: COLONOSCOPY WITH PROPOFOL;  Surgeon: [name], MD;  Location: [facility] ENDOSCOPY;  Service: Gastroenterology;  Laterality: N/A;  . COLONOSCOPY WITH PROPOFOL N/A 08/12/2019   Procedure: COLONOSCOPY WITH PROPOFOL;  Surgeon: [name], MD;  Location: [facility] ENDOSCOPY;  Service: Gastroenterology;  Laterality: N/A;  . COLONOSCOPY WITH PROPOFOL N/A 09/19/2019   Procedure: COLONOSCOPY WITH PROPOFOL;  Surgeon: [name], MD;  Location: [facility] ENDOSCOPY;  Service:  Gastroenterology;  Laterality: N/A;  . DILATION AND CURETTAGE OF UTERUS    . ESOPHAGOGASTRODUODENOSCOPY (EGD) WITH PROPOFOL N/A 06/13/2019   Procedure: ESOPHAGOGASTRODUODENOSCOPY (EGD) WITH PROPOFOL;  Surgeon: [name], MD;  Location: [facility] ENDOSCOPY;  Service: Gastroenterology;  Laterality: N/A;  Pt will go for COVID test on 69-6-78 as she uses public transportation and will be in the area on that day.   . ESOPHAGOGASTRODUODENOSCOPY (EGD) WITH PROPOFOL N/A 10/31/2019   Procedure: ESOPHAGOGASTRODUODENOSCOPY (EGD) WITH PROPOFOL;  Surgeon: Rush Landmark Telford Nab., MD;  Location: [place];  Service: Gastroenterology;  Laterality: N/A;  . HEMOSTASIS CLIP PLACEMENT  10/31/2019   Procedure: HEMOSTASIS CLIP PLACEMENT;  Surgeon: Irving Copas., MD;  Location: [place];  Service: Gastroenterology;;  . HYSTEROSCOPY WITH D & C N/A 02/16/2017   Procedure: DILATATION AND CURETTAGE [place] Glen;  Surgeon: Brayton Mars, MD;  Location: [facility];  Service: Gynecology;  Laterality: N/A;  . JOINT REPLACEMENT    . kidney removal Left   . KIDNEY SURGERY Left 1979   left kidney removed  . LEFT HEART CATH AND CORONARY ANGIOGRAPHY N/A 10/21/2017   Procedure: LEFT HEART CATH AND CORONARY ANGIOGRAPHY;  Surgeon: Teodoro Spray, MD;  Location: [place] CV LAB;  Service: Cardiovascular;  Laterality: N/A;  . POLYPECTOMY  10/31/2019   Procedure: POLYPECTOMY;  Surgeon: Justice Britain  Brooke Bonito., MD;  Location: [place];  Service: Gastroenterology;;  . TOTAL KNEE ARTHROPLASTY Left 07/06/2017   Procedure: TOTAL KNEE ARTHROPLASTY;  Surgeon: Lovell Sheehan, MD;  Location: [facility];  Service: Orthopedics;  Laterality: Left;  . UPPER ESOPHAGEAL ENDOSCOPIC ULTRASOUND (EUS) N/A 10/31/2019   Procedure: UPPER ESOPHAGEAL ENDOSCOPIC ULTRASOUND (EUS);  Surgeon: Irving Copas., MD;  Location: [place];  Service: Gastroenterology;  Laterality: N/A;  . WRIST ARTHROSCOPY Left 1970s  . XI ROBOTIC  ASSISTED VENTRAL HERNIA N/A 12/01/2019   Procedure: XI ROBOTIC ASSISTED VENTRAL HERNIA;  Surgeon: Jules Husbands, MD;  Location: [facility]S;  Service: General;  Laterality: N/A;    There were no vitals filed for this visit.   Subjective Assessment - 05/01/20 1303    Subjective Patient reports shes doing well.    Pertinent History s/p TKA 07/06/2017; right knee arthritis with weakness as well with reports of right and left leg "giving way" intermittently; Going to have TKA 6/10    Limitations Walking;Standing;House hold activities;Other (comment)    How long can you sit comfortably? No Limit    How long can you stand comfortably? 2-3 hours    How long can you walk comfortably? 2-3 hours    Patient Stated Goals Improve motion and strength of  hips and R knee and being able to walk with less/no pain.    Currently in Pain? No/denies    Pain Onset More than a month ago            Not Able to perform exercises during session due to patients elevated BP above appropriate range.                         PT Education - 05/01/20 1308    Education provided Yes    Education Details form/technique of exercise and BP    Person(s) Educated Patient    Methods Explanation;Demonstration    Comprehension Verbalized understanding;Returned demonstration            PT Short Term Goals - 04/04/20 1348      PT SHORT TERM GOAL #1   Title Patient will demonstrate independence with HEP to maximize rehab potential.    Time 3    Period Weeks    Status New    Target Date 04/25/20             PT Long Term Goals - 04/10/20 0926      PT LONG TERM GOAL #1   Title Patient will demonstrate independence with progressive HEP to maintain progress made during therapy.    Time 6    Period Weeks    Status New      PT LONG TERM GOAL #2   Title Patient will improve FOTO score to 58 to show an improvement in patients ability to perform functional activities.    Baseline 50 at Eval     Time 6    Period Weeks    Status New      PT LONG TERM GOAL #3   Title Patient will have a worst pain of 3/10 to indicate significant improvement with pain and ability to perform functional activities more functionally.     Baseline 10/10    Time 6    Period Weeks    Status New      PT LONG TERM GOAL #4   Title Patient will be able to descend stairs without pain to show improvement in quad strength and  knee stability.    Baseline pain with descending stairs    Time 6    Period Weeks    Status New      PT LONG TERM GOAL #5   Title Patient will improve 2MWT test by 10ft to show improvement in patients aerobic capacity and ability to ambulate out in community.    Baseline 498ft at eval.6    Time 6    Period Weeks    Status New    Target Date 05/16/20                 Plan - 05/01/20 1340    Clinical Impression Statement Continued to monitor patients BP during session; BP was elevated at beginning of session (178/110 mmHg).  BP was reassessed after allowing patient to rest for 5 minutes and it remained elevated (168/100 mmHg). BP was reassessed again 5 minutes later and remained elevated above appropriate ranges to exercise (170/137mmHg). Patient was instructed to call doctor immediately and if symptoms begin go to ER.    Personal Factors and Comorbidities Age;Comorbidity 2    Comorbidities HBP, Obesity    Examination-Activity Limitations Stairs;Locomotion Level;Stand    Examination-Participation Restrictions Shop    Stability/Clinical Decision Making Evolving/Moderate complexity    Clinical Decision Making Moderate    Rehab Potential Good    Clinical Impairments Affecting Rehab Potential (+) motivated, chronic condition (-) arthritis right knee with pain    PT Frequency 2x / week    PT Duration 6 weeks    PT Treatment/Interventions Electrical Stimulation;Cryotherapy;Moist Heat;Gait training;Stair training;Therapeutic activities;Therapeutic exercise;Balance  training;Patient/family education;Neuromuscular re-education;Manual techniques;Passive range of motion;Dry needling;Spinal Manipulations;Joint Manipulations;Functional mobility training;Traction    PT Next Visit Plan Monitor BP throughout session and continue POC    PT Home Exercise Plan Heel Raises, standing hip abduction    Consulted and Agree with Plan of Care Patient           Patient will benefit from skilled therapeutic intervention in order to improve the following deficits and impairments:  Pain, Decreased activity tolerance, Decreased endurance, Decreased range of motion, Decreased strength, Impaired perceived functional ability, Difficulty walking, Decreased mobility, Obesity  Visit Diagnosis: Chronic pain of right knee  Muscle weakness (generalized)  Pain in left hip     Problem List Patient Active Problem List   Diagnosis Date Noted  . Fever blister 10/15/2019  . Cerumen in auditory canal on examination 10/15/2019  . Bilateral hearing loss 10/15/2019  . Need for vaccination against Streptococcus pneumoniae using pneumococcal conjugate vaccine 13 10/15/2019  . Encounter for screening mammogram for malignant neoplasm of breast 10/15/2019  . Screening for osteoporosis 10/15/2019  . Elevated erythrocyte sedimentation rate 11/02/2018  . Lumbar spondylosis 11/02/2018  . PVC's (premature ventricular contractions) 10/18/2018  . Primary osteoarthritis of right knee 09/15/2018  . Encounter for pre-operative examination 09/15/2018  . Obstructive sleep apnea 09/15/2018  . Calculus of gallbladder without cholecystitis without obstruction 09/15/2018  . Calculus of gallbladder with cholecystitis without biliary obstruction 01/27/2018  . Restless leg syndrome 01/27/2018  . Generalized abdominal pain 01/11/2018  . Cyst of pancreas 01/11/2018  . Other specified disorders of kidney and ureter 01/11/2018  . Primary insomnia 01/11/2018  . Paroxysmal atrial fibrillation ([place])  11/12/2017  . Essential hypertension 10/25/2017  . Mixed hyperlipidemia 10/25/2017  . Low back pain at multiple sites 10/25/2017  . Dysuria 10/25/2017  . Encounter for general adult medical examination with abnormal findings 10/25/2017  . Vitamin D deficiency 10/25/2017  . Near syncope   .  Symptomatic bradycardia 10/19/2017  . Carpal tunnel syndrome 10/12/2017  . Primary osteoarthritis of left knee 07/09/2017  . History of total knee arthroplasty 07/06/2017  . Chest pain 06/29/2017  . Postop check 02/25/2017  . Impingement syndrome of shoulder region 01/20/2017  . Neck pain 01/20/2017  . Obesity (BMI 35.0-39.9 without comorbidity) 07/15/2016  . Endometrial polyp 07/15/2016  . Morbid obesity ([place]) 07/15/2016  . Family history of breast cancer in first degree relative 07/15/2016  . Family history of ovarian cancer 07/15/2016  . Knee pain 07/04/2016  . Bilateral leg pain 06/24/2016   1:43 PM, 05/01/20 Margarito Liner, SPT Student Physical Therapist [place]  [phone]  Margarito Liner 05/01/2020, 1:43 PM   [place] [address], [place], [address] Phone: [phone]   Fax:  [phone]  Name: Whitney [name] MRN: [id number] Date of Birth: [birth year]

## 2020-05-03 ENCOUNTER — Ambulatory Visit: Payer: Medicare Other

## 2020-05-03 ENCOUNTER — Telehealth: Payer: Self-pay

## 2020-05-03 NOTE — Telephone Encounter (Signed)
Left a message and asked pt to call and schedule appointment regarding blood pressure. Whitney [name]

## 2020-05-07 ENCOUNTER — Ambulatory Visit: Payer: Medicare Other

## 2020-05-10 ENCOUNTER — Ambulatory Visit: Payer: Medicare Other

## 2020-05-10 ENCOUNTER — Ambulatory Visit (INDEPENDENT_AMBULATORY_CARE_PROVIDER_SITE_OTHER): Payer: Medicare Other | Admitting: Internal Medicine

## 2020-05-10 ENCOUNTER — Encounter: Payer: Self-pay | Admitting: Internal Medicine

## 2020-05-10 VITALS — BP 138/94 | HR 91 | Temp 98.0°F | Resp 16 | Ht 65.0 in | Wt 245.4 lb

## 2020-05-10 DIAGNOSIS — R112 Nausea with vomiting, unspecified: Secondary | ICD-10-CM | POA: Diagnosis not present

## 2020-05-10 DIAGNOSIS — R1032 Left lower quadrant pain: Secondary | ICD-10-CM

## 2020-05-10 DIAGNOSIS — I10 Essential (primary) hypertension: Secondary | ICD-10-CM | POA: Diagnosis not present

## 2020-05-10 LAB — POCT URINALYSIS DIPSTICK
Bilirubin, UA: NEGATIVE
Blood, UA: NEGATIVE
Glucose, UA: NEGATIVE
Ketones, UA: NEGATIVE
Leukocytes, UA: NEGATIVE
Nitrite, UA: NEGATIVE
Protein, UA: POSITIVE — AB
Spec Grav, UA: 1.015 (ref 1.010–1.025)
Urobilinogen, UA: 0.2 E.U./dL
pH, UA: 5 (ref 5.0–8.0)

## 2020-05-10 MED ORDER — TRAMADOL HCL 50 MG PO TABS: ORAL_TABLET | ORAL | 0 refills | Status: DC

## 2020-05-10 NOTE — Progress Notes (Signed)
[facility] [place],  [address]  Internal MEDICINE  Office Visit Note  Patient Name: Whitney [name]  [id number]  [id number]  Date of Service: 05/16/2020  Chief Complaint  Patient presents with  . Acute Visit    high bp, left side pain and gas, feels like its getting worse, feels nauseous, poor appetite soft foods, if pt eats solid food she throw it up, headaches, has been feeling this way for 6 days, worse as she moves around  . Hypertension  . Quality Metric Gaps    TDAP, PNA    HPI Pt is here for acte visit, she has been very uncomfortable, she is now vomiting and is unable to sit and get comfortable, she has left nephrectomy, no h/o renal stones. Denies any diarrhea or constipation, slight fever but she is having chills Blood pressure is elevated as well, she has atrial fib and on chronic anticoagulation  Current Medication: No facility-administered encounter medications on file as of 05/10/2020.   Outpatient Encounter Medications as of 05/10/2020  Medication Sig  . amLODipine (NORVASC) 5 MG tablet Take 1 tablet (5 mg total) by mouth 2 (two) times daily.  Marland Kitchen aspirin EC 81 MG tablet Take 81 mg by mouth daily. (Patient not taking: Reported on 05/13/2020)  . atorvastatin (LIPITOR) 40 MG tablet Take 1 tablet (40 mg total) by mouth daily at 6 PM. (Patient taking differently: Take 40 mg by mouth at bedtime. )  . carvedilol (COREG) 3.125 MG tablet TAKE 2 TABLETS(6.25 MG) BY MOUTH TWICE DAILY WITH A MEAL (Patient taking differently: Take 6.25 mg by mouth in the morning and at bedtime. )  . Cholecalciferol (VITAMIN D) 50 MCG (2000 UT) tablet Take 2,000 Units by mouth in the morning and at bedtime.  (Patient not taking: Reported on 05/13/2020)  . diclofenac Sodium (VOLTAREN) 1 % GEL Apply 1 application topically 4 (four) times daily as needed (pain).   . flecainide (TAMBOCOR) 100 MG tablet Take 100 mg by mouth 2 (two) times daily as needed (palpitations).   .  orlistat (ALLI) 60 MG capsule Take 60 mg by mouth 3 (three) times daily with meals.  Marland Kitchen OVER THE COUNTER MEDICATION Apply 1 application topically in the morning and at bedtime. Tummy cream  . traMADol (ULTRAM) 50 MG tablet Take one tab po bid for pain  . traZODone (DESYREL) 50 MG tablet Take 0.5 tablets (25 mg total) by mouth at bedtime as needed for sleep. (Patient not taking: Reported on 05/13/2020)  . [DISCONTINUED] gabapentin (NEURONTIN) 100 MG capsule Take 1 capsule (100 mg total) by mouth 2 (two) times daily. (Patient taking differently: Take 100 mg by mouth 2 (two) times daily as needed (pain). )  . [DISCONTINUED] hydrALAZINE (APRESOLINE) 25 MG tablet Take 1 tablet (25 mg total) by mouth 3 (three) times daily.  . [DISCONTINUED] HYDROcodone-acetaminophen (NORCO/VICODIN) 5-325 MG tablet Take 1-2 tablets by mouth every 6 (six) hours as needed for moderate pain.  . [DISCONTINUED] meclizine (ANTIVERT) 25 MG tablet Take 1 tablet (25 mg total) by mouth 2 (two) times daily as needed for dizziness.  . [DISCONTINUED] ondansetron (ZOFRAN-ODT) 4 MG disintegrating tablet Take 1 tablet (4 mg total) by mouth every 8 (eight) hours as needed for nausea or vomiting.  . [DISCONTINUED] rOPINIRole (REQUIP) 0.5 MG tablet Takes 1 or 2 tablets daily as needed (Patient taking differently: Take 0.5 mg by mouth 2 (two) times daily as needed (restless legs). )  . [DISCONTINUED] traMADol (ULTRAM) 50 MG  tablet Take 1 tablet (50 mg total) by mouth every 12 (twelve) hours as needed.  Marland Kitchen omeprazole (PRILOSEC) 40 MG capsule Take 1 capsule (40 mg total) by mouth 2 (two) times daily before a meal. For 1 month.  Then decrease to once daily dosing thereafter.  Please take before meals.  . [DISCONTINUED] polyethylene glycol (GOLYTELY) 236 g solution Drink 8 oz every 20-30 minutes until entire prep is finished. (Patient not taking: Reported on 05/10/2020)    Surgical History: Past Surgical History:  Procedure Laterality Date  .  BIOPSY  10/31/2019   Procedure: BIOPSY;  Surgeon: Rush Landmark Telford Nab., MD;  Location: [place];  Service: Gastroenterology;;  . CARDIAC CATHETERIZATION    . CHOLECYSTECTOMY    . COLONOSCOPY WITH PROPOFOL N/A 06/13/2019   Procedure: COLONOSCOPY WITH PROPOFOL;  Surgeon: [name], MD;  Location: [facility] ENDOSCOPY;  Service: Gastroenterology;  Laterality: N/A;  . COLONOSCOPY WITH PROPOFOL N/A 08/12/2019   Procedure: COLONOSCOPY WITH PROPOFOL;  Surgeon: [name], MD;  Location: [facility] ENDOSCOPY;  Service: Gastroenterology;  Laterality: N/A;  . COLONOSCOPY WITH PROPOFOL N/A 09/19/2019   Procedure: COLONOSCOPY WITH PROPOFOL;  Surgeon: [name], MD;  Location: [facility] ENDOSCOPY;  Service: Gastroenterology;  Laterality: N/A;  . DILATION AND CURETTAGE OF UTERUS    . ESOPHAGOGASTRODUODENOSCOPY (EGD) WITH PROPOFOL N/A 06/13/2019   Procedure: ESOPHAGOGASTRODUODENOSCOPY (EGD) WITH PROPOFOL;  Surgeon: [name], MD;  Location: [facility] ENDOSCOPY;  Service: Gastroenterology;  Laterality: N/A;  Pt will go for COVID test on 64-3-32 as she uses public transportation and will be in the area on that day.   . ESOPHAGOGASTRODUODENOSCOPY (EGD) WITH PROPOFOL N/A 10/31/2019   Procedure: ESOPHAGOGASTRODUODENOSCOPY (EGD) WITH PROPOFOL;  Surgeon: Rush Landmark Telford Nab., MD;  Location: [place];  Service: Gastroenterology;  Laterality: N/A;  . HEMOSTASIS CLIP PLACEMENT  10/31/2019   Procedure: HEMOSTASIS CLIP PLACEMENT;  Surgeon: Irving Copas., MD;  Location: [place];  Service: Gastroenterology;;  . HYSTEROSCOPY WITH D & C N/A 02/16/2017   Procedure: DILATATION AND CURETTAGE [place];  Surgeon: Brayton Mars, MD;  Location: [facility];  Service: Gynecology;  Laterality: N/A;  . JOINT REPLACEMENT    . kidney removal Left   . KIDNEY SURGERY Left 1979   left kidney removed  . LEFT HEART CATH AND CORONARY ANGIOGRAPHY N/A 10/21/2017   Procedure: LEFT HEART CATH AND CORONARY ANGIOGRAPHY;  Surgeon:  Teodoro Spray, MD;  Location: [place] CV LAB;  Service: Cardiovascular;  Laterality: N/A;  . POLYPECTOMY  10/31/2019   Procedure: POLYPECTOMY;  Surgeon: Rush Landmark Telford Nab., MD;  Location: [place];  Service: Gastroenterology;;  . TOTAL KNEE ARTHROPLASTY Left 07/06/2017   Procedure: TOTAL KNEE ARTHROPLASTY;  Surgeon: Lovell Sheehan, MD;  Location: [facility];  Service: Orthopedics;  Laterality: Left;  . UPPER ESOPHAGEAL ENDOSCOPIC ULTRASOUND (EUS) N/A 10/31/2019   Procedure: UPPER ESOPHAGEAL ENDOSCOPIC ULTRASOUND (EUS);  Surgeon: Irving Copas., MD;  Location: [place];  Service: Gastroenterology;  Laterality: N/A;  . WRIST ARTHROSCOPY Left 1970s  . XI ROBOTIC ASSISTED VENTRAL HERNIA N/A 12/01/2019   Procedure: XI ROBOTIC ASSISTED VENTRAL HERNIA;  Surgeon: Jules Husbands, MD;  Location: [facility];  Service: General;  Laterality: N/A;    Medical History: Past Medical History:  Diagnosis Date  . Abnormal Pap smear of cervix    Pt states she had colposcopy   . Anxiety   . Arthritis   . Chronic kidney disease    removed left kidney  . Depression   . Dyspnea   .  Dysrhythmia   . Hx of unilateral nephrectomy 1979   Left Nephrectomy  . Hypertension   . Lower extremity edema   . Palpitations   . PMB (postmenopausal bleeding)   . Restless leg syndrome   . Sleep apnea    has no cpap  . Vitamin D deficiency     Family History: Family History  Problem Relation Age of Onset  . Ovarian cancer Mother   . Hypertension Father   . Diabetes Father   . Cancer Father   . Breast cancer Sister   . Diabetes Sister     Social History   Socioeconomic History  . Marital status: Divorced    Spouse name: Not on file  . Number of children: Not on file  . Years of education: Not on file  . Highest education level: Not on file  Occupational History  . Not on file  Tobacco Use  . Smoking status: Never Smoker  . Smokeless tobacco: Never Used  Vaping Use  . Vaping Use:  Never used  Substance and Sexual Activity  . Alcohol use: Yes    Alcohol/week: 2.0 standard drinks    Types: 2 Standard drinks or equivalent per week    Comment: occasionally  . Drug use: Yes    Frequency: 3.0 times per week    Types: Marijuana, Cocaine    Comment: Pt states she uses for leg pain: marijuana; states no longer uses cocaine  . Sexual activity: Yes    Birth control/protection: None  Other Topics Concern  . Not on file  Social History Narrative   Lives with cousin.   Social Determinants of Health   Financial Resource Strain:   . Difficulty of Paying Living Expenses: Not on file  Food Insecurity:   . Worried About Charity fundraiser in the Last Year: Not on file  . Ran Out of Food in the Last Year: Not on file  Transportation Needs:   . Lack of Transportation (Medical): Not on file  . Lack of Transportation (Non-Medical): Not on file  Physical Activity:   . Days of Exercise per Week: Not on file  . Minutes of Exercise per Session: Not on file  Stress:   . Feeling of Stress : Not on file  Social Connections:   . Frequency of Communication with Friends and Family: Not on file  . Frequency of Social Gatherings with Friends and Family: Not on file  . Attends Religious Services: Not on file  . Active Member of Clubs or Organizations: Not on file  . Attends Archivist Meetings: Not on file  . Marital Status: Not on file  Intimate Partner Violence:   . Fear of Current or Ex-Partner: Not on file  . Emotionally Abused: Not on file  . Physically Abused: Not on file  . Sexually Abused: Not on file      Review of Systems  Constitutional: Positive for fatigue. Negative for chills and diaphoresis.  HENT: Negative for ear pain, postnasal drip and sinus pressure.   Eyes: Negative for photophobia, discharge, redness, itching and visual disturbance.  Respiratory: Negative for cough, shortness of breath and wheezing.   Cardiovascular: Negative for chest pain,  palpitations and leg swelling.  Gastrointestinal: Positive for abdominal pain and nausea. Negative for constipation, diarrhea and vomiting.  Genitourinary: Negative for dysuria and flank pain.  Musculoskeletal: Negative for arthralgias, back pain, gait problem and neck pain.  Skin: Negative for color change.  Allergic/Immunologic: Negative for environmental allergies and  food allergies.  Neurological: Negative for dizziness and headaches.  Hematological: Does not bruise/bleed easily.  Psychiatric/Behavioral: Negative for agitation, behavioral problems (depression) and hallucinations.    Vital Signs: BP (!) 138/94   Pulse 91   Temp 98 F (36.7 C)   Resp 16   Ht 5\' 5"  (1.651 m)   Wt 245 lb 6.4 oz (111.3 kg)   LMP 01/26/2017 Comment: age 25  SpO2 97%   BMI 40.84 kg/m    Physical Exam Constitutional:      General: She is in acute distress.     Appearance: She is ill-appearing.  HENT:     Head: Normocephalic and atraumatic.     Mouth/Throat:     Mouth: Mucous membranes are dry.  Eyes:     Extraocular Movements: Extraocular movements intact.     Pupils: Pupils are equal, round, and reactive to light.  Cardiovascular:     Rate and Rhythm: Normal rate and regular rhythm.  Pulmonary:     Effort: Pulmonary effort is normal.     Breath sounds: Normal breath sounds.  Abdominal:     Tenderness: There is abdominal tenderness.     Comments: Left lower abdominal tenderness, no rebound     Assessment/Plan: 1. Left lower quadrant abdominal pain Pt looks acutely sick, she is sent to ED for further DX and treatment  - POCT Urinalysis Dipstick - traMADol (ULTRAM) 50 MG tablet; Take one tab po bid for pain  Dispense: 30 tablet; Refill: 0  2. Non-intractable vomiting with nausea, unspecified vomiting type Pt has not been able to eat, decreased PO intake, she is sent to ED  3. Benign hypertension Bp is slightly elevated, most likely due to pain  General Counseling: coretta leisey  understanding of the findings of todays visit and agrees with plan of treatment. I have discussed any further diagnostic evaluation that may be needed or ordered today. We also reviewed her medications today. she has been encouraged to call the office with any questions or concerns that should arise related to todays visit.    Orders Placed This Encounter  Procedures  . POCT Urinalysis Dipstick    Meds ordered this encounter  Medications  . traMADol (ULTRAM) 50 MG tablet    Sig: Take one tab po bid for pain    Dispense:  30 tablet    Refill:  0    Total time spent:30 Minutes Time spent includes review of chart, medications, test results, and follow up plan with the patient.      Dr Lavera Guise Internal medicine

## 2020-05-12 ENCOUNTER — Other Ambulatory Visit: Payer: Self-pay

## 2020-05-12 ENCOUNTER — Encounter: Payer: Self-pay | Admitting: Emergency Medicine

## 2020-05-12 DIAGNOSIS — R112 Nausea with vomiting, unspecified: Secondary | ICD-10-CM | POA: Diagnosis present

## 2020-05-12 DIAGNOSIS — G4733 Obstructive sleep apnea (adult) (pediatric): Secondary | ICD-10-CM | POA: Diagnosis present

## 2020-05-12 DIAGNOSIS — I480 Paroxysmal atrial fibrillation: Secondary | ICD-10-CM | POA: Diagnosis not present

## 2020-05-12 DIAGNOSIS — K5732 Diverticulitis of large intestine without perforation or abscess without bleeding: Principal | ICD-10-CM | POA: Diagnosis present

## 2020-05-12 DIAGNOSIS — Z888 Allergy status to other drugs, medicaments and biological substances status: Secondary | ICD-10-CM | POA: Diagnosis not present

## 2020-05-12 DIAGNOSIS — Z905 Acquired absence of kidney: Secondary | ICD-10-CM | POA: Diagnosis not present

## 2020-05-12 DIAGNOSIS — E785 Hyperlipidemia, unspecified: Secondary | ICD-10-CM | POA: Diagnosis not present

## 2020-05-12 DIAGNOSIS — Z8249 Family history of ischemic heart disease and other diseases of the circulatory system: Secondary | ICD-10-CM

## 2020-05-12 DIAGNOSIS — Z23 Encounter for immunization: Secondary | ICD-10-CM | POA: Diagnosis not present

## 2020-05-12 DIAGNOSIS — E860 Dehydration: Secondary | ICD-10-CM | POA: Diagnosis present

## 2020-05-12 DIAGNOSIS — N183 Chronic kidney disease, stage 3 unspecified: Secondary | ICD-10-CM | POA: Diagnosis present

## 2020-05-12 DIAGNOSIS — K639 Disease of intestine, unspecified: Secondary | ICD-10-CM | POA: Diagnosis not present

## 2020-05-12 DIAGNOSIS — G2581 Restless legs syndrome: Secondary | ICD-10-CM | POA: Diagnosis not present

## 2020-05-12 DIAGNOSIS — R109 Unspecified abdominal pain: Secondary | ICD-10-CM | POA: Diagnosis not present

## 2020-05-12 DIAGNOSIS — I129 Hypertensive chronic kidney disease with stage 1 through stage 4 chronic kidney disease, or unspecified chronic kidney disease: Secondary | ICD-10-CM | POA: Diagnosis not present

## 2020-05-12 DIAGNOSIS — H9193 Unspecified hearing loss, bilateral: Secondary | ICD-10-CM | POA: Diagnosis present

## 2020-05-12 DIAGNOSIS — K219 Gastro-esophageal reflux disease without esophagitis: Secondary | ICD-10-CM | POA: Diagnosis not present

## 2020-05-12 DIAGNOSIS — N179 Acute kidney failure, unspecified: Secondary | ICD-10-CM | POA: Diagnosis present

## 2020-05-12 DIAGNOSIS — Z7982 Long term (current) use of aspirin: Secondary | ICD-10-CM

## 2020-05-12 DIAGNOSIS — Z6841 Body Mass Index (BMI) 40.0 and over, adult: Secondary | ICD-10-CM

## 2020-05-12 DIAGNOSIS — K449 Diaphragmatic hernia without obstruction or gangrene: Secondary | ICD-10-CM | POA: Diagnosis not present

## 2020-05-12 DIAGNOSIS — M1990 Unspecified osteoarthritis, unspecified site: Secondary | ICD-10-CM | POA: Diagnosis present

## 2020-05-12 DIAGNOSIS — R000 Tachycardia, unspecified: Secondary | ICD-10-CM | POA: Diagnosis not present

## 2020-05-12 DIAGNOSIS — R0602 Shortness of breath: Secondary | ICD-10-CM | POA: Diagnosis not present

## 2020-05-12 DIAGNOSIS — K6389 Other specified diseases of intestine: Secondary | ICD-10-CM | POA: Diagnosis not present

## 2020-05-12 DIAGNOSIS — R0789 Other chest pain: Secondary | ICD-10-CM | POA: Diagnosis not present

## 2020-05-12 DIAGNOSIS — Z20822 Contact with and (suspected) exposure to covid-19: Secondary | ICD-10-CM | POA: Diagnosis present

## 2020-05-12 DIAGNOSIS — R079 Chest pain, unspecified: Secondary | ICD-10-CM | POA: Diagnosis not present

## 2020-05-12 DIAGNOSIS — E559 Vitamin D deficiency, unspecified: Secondary | ICD-10-CM | POA: Diagnosis present

## 2020-05-12 DIAGNOSIS — Z743 Need for continuous supervision: Secondary | ICD-10-CM | POA: Diagnosis not present

## 2020-05-12 DIAGNOSIS — Z79899 Other long term (current) drug therapy: Secondary | ICD-10-CM | POA: Diagnosis not present

## 2020-05-12 DIAGNOSIS — K5792 Diverticulitis of intestine, part unspecified, without perforation or abscess without bleeding: Secondary | ICD-10-CM | POA: Diagnosis not present

## 2020-05-12 DIAGNOSIS — E669 Obesity, unspecified: Secondary | ICD-10-CM | POA: Diagnosis not present

## 2020-05-12 NOTE — ED Triage Notes (Signed)
Pt reports she developed abdominal pain since 05/04/2020 reports today pain radiating to left flank area, pt reports had left kidney removed  and gallbladder removed. Pt belching in triage reports she has had this symptom for a while but tonight it has gotten worse, pt reports she also had chest pain this evening. Pt talks in complete sentences no respiratory distress noted.

## 2020-05-12 NOTE — ED Notes (Signed)
Pt arrives via [facility] with c/o abdominal pain and states that she has not been able to stop burping for a week. Pt reports having hx of GERD.

## 2020-05-13 ENCOUNTER — Emergency Department: Payer: Medicare Other

## 2020-05-13 ENCOUNTER — Inpatient Hospital Stay
Admission: EM | Admit: 2020-05-13 | Discharge: 2020-05-17 | DRG: 392 | Disposition: A | Discharge status: Home / Self Care | Payer: Medicare Other | Attending: Family Medicine | Admitting: Family Medicine

## 2020-05-13 ENCOUNTER — Encounter: Payer: Self-pay | Admitting: Radiology

## 2020-05-13 DIAGNOSIS — Z6841 Body Mass Index (BMI) 40.0 and over, adult: Secondary | ICD-10-CM | POA: Diagnosis not present

## 2020-05-13 DIAGNOSIS — Z888 Allergy status to other drugs, medicaments and biological substances status: Secondary | ICD-10-CM | POA: Diagnosis not present

## 2020-05-13 DIAGNOSIS — K5792 Diverticulitis of intestine, part unspecified, without perforation or abscess without bleeding: Secondary | ICD-10-CM | POA: Diagnosis not present

## 2020-05-13 DIAGNOSIS — M1990 Unspecified osteoarthritis, unspecified site: Secondary | ICD-10-CM | POA: Diagnosis present

## 2020-05-13 DIAGNOSIS — K219 Gastro-esophageal reflux disease without esophagitis: Secondary | ICD-10-CM | POA: Diagnosis present

## 2020-05-13 DIAGNOSIS — Z20822 Contact with and (suspected) exposure to covid-19: Secondary | ICD-10-CM | POA: Diagnosis present

## 2020-05-13 DIAGNOSIS — I480 Paroxysmal atrial fibrillation: Secondary | ICD-10-CM

## 2020-05-13 DIAGNOSIS — Z79899 Other long term (current) drug therapy: Secondary | ICD-10-CM | POA: Diagnosis not present

## 2020-05-13 DIAGNOSIS — Z905 Acquired absence of kidney: Secondary | ICD-10-CM | POA: Diagnosis not present

## 2020-05-13 DIAGNOSIS — N183 Chronic kidney disease, stage 3 unspecified: Secondary | ICD-10-CM | POA: Diagnosis present

## 2020-05-13 DIAGNOSIS — E669 Obesity, unspecified: Secondary | ICD-10-CM | POA: Diagnosis not present

## 2020-05-13 DIAGNOSIS — N179 Acute kidney failure, unspecified: Secondary | ICD-10-CM

## 2020-05-13 DIAGNOSIS — E860 Dehydration: Secondary | ICD-10-CM | POA: Diagnosis present

## 2020-05-13 DIAGNOSIS — G4733 Obstructive sleep apnea (adult) (pediatric): Secondary | ICD-10-CM

## 2020-05-13 DIAGNOSIS — K5732 Diverticulitis of large intestine without perforation or abscess without bleeding: Secondary | ICD-10-CM | POA: Diagnosis present

## 2020-05-13 DIAGNOSIS — K639 Disease of intestine, unspecified: Secondary | ICD-10-CM | POA: Diagnosis not present

## 2020-05-13 DIAGNOSIS — E559 Vitamin D deficiency, unspecified: Secondary | ICD-10-CM | POA: Diagnosis present

## 2020-05-13 DIAGNOSIS — Z7982 Long term (current) use of aspirin: Secondary | ICD-10-CM | POA: Diagnosis not present

## 2020-05-13 DIAGNOSIS — E785 Hyperlipidemia, unspecified: Secondary | ICD-10-CM | POA: Diagnosis present

## 2020-05-13 DIAGNOSIS — Z8249 Family history of ischemic heart disease and other diseases of the circulatory system: Secondary | ICD-10-CM | POA: Diagnosis not present

## 2020-05-13 DIAGNOSIS — I129 Hypertensive chronic kidney disease with stage 1 through stage 4 chronic kidney disease, or unspecified chronic kidney disease: Secondary | ICD-10-CM | POA: Diagnosis present

## 2020-05-13 DIAGNOSIS — Z23 Encounter for immunization: Secondary | ICD-10-CM | POA: Diagnosis not present

## 2020-05-13 DIAGNOSIS — H9193 Unspecified hearing loss, bilateral: Secondary | ICD-10-CM | POA: Diagnosis present

## 2020-05-13 DIAGNOSIS — R112 Nausea with vomiting, unspecified: Secondary | ICD-10-CM | POA: Diagnosis present

## 2020-05-13 DIAGNOSIS — G2581 Restless legs syndrome: Secondary | ICD-10-CM | POA: Diagnosis present

## 2020-05-13 LAB — HEPATIC FUNCTION PANEL
ALT: 11 U/L (ref 0–44)
AST: 13 U/L — ABNORMAL LOW (ref 15–41)
Albumin: 3.2 g/dL — ABNORMAL LOW (ref 3.5–5.0)
Alkaline Phosphatase: 68 U/L (ref 38–126)
Bilirubin, Direct: <0.1 mg/dL (ref 0.0–0.2)
Total Bilirubin: 0.6 mg/dL (ref 0.3–1.2)
Total Protein: 7.4 g/dL (ref 6.5–8.1)

## 2020-05-13 LAB — URINALYSIS, COMPLETE (UACMP) WITH MICROSCOPIC
Bilirubin Urine: NEGATIVE
Glucose, UA: NEGATIVE mg/dL
Hgb urine dipstick: NEGATIVE
Ketones, ur: NEGATIVE mg/dL
Leukocytes,Ua: NEGATIVE
Nitrite: NEGATIVE
Protein, ur: >=300 mg/dL — AB
Specific Gravity, Urine: 1.015 (ref 1.005–1.030)
pH: 5 (ref 5.0–8.0)

## 2020-05-13 LAB — BASIC METABOLIC PANEL
Anion gap: 9 (ref 5–15)
BUN: 27 mg/dL — ABNORMAL HIGH (ref 8–23)
CO2: 24 mmol/L (ref 22–32)
Calcium: 8.6 mg/dL — ABNORMAL LOW (ref 8.9–10.3)
Chloride: 105 mmol/L (ref 98–111)
Creatinine, Ser: 1.67 mg/dL — ABNORMAL HIGH (ref 0.44–1.00)
GFR, Estimated: 31 mL/min — ABNORMAL LOW (ref >60)
Glucose, Bld: 102 mg/dL — ABNORMAL HIGH (ref 70–99)
Potassium: 4.2 mmol/L (ref 3.5–5.1)
Sodium: 138 mmol/L (ref 135–145)

## 2020-05-13 LAB — CBC
HCT: 38.6 % (ref 36.0–46.0)
Hemoglobin: 12.8 g/dL (ref 12.0–15.0)
MCH: 29.6 pg (ref 26.0–34.0)
MCHC: 33.2 g/dL (ref 30.0–36.0)
MCV: 89.4 fL (ref 80.0–100.0)
Platelets: 293 10*3/uL (ref 150–400)
RBC: 4.32 MIL/uL (ref 3.87–5.11)
RDW: 12.1 % (ref 11.5–15.5)
WBC: 9.1 10*3/uL (ref 4.0–10.5)
nRBC: 0 % (ref 0.0–0.2)

## 2020-05-13 LAB — GLUCOSE, CAPILLARY: Glucose-Capillary: 75 mg/dL (ref 70–99)

## 2020-05-13 LAB — RESPIRATORY PANEL BY RT PCR (FLU A&B, COVID)
Influenza A by PCR: NEGATIVE
Influenza B by PCR: NEGATIVE
SARS Coronavirus 2 by RT PCR: NEGATIVE

## 2020-05-13 LAB — TROPONIN I (HIGH SENSITIVITY)
Troponin I (High Sensitivity): 9 ng/L (ref <18)
Troponin I (High Sensitivity): 9 ng/L (ref <18)

## 2020-05-13 LAB — LIPASE, BLOOD: Lipase: 32 U/L (ref 11–51)

## 2020-05-13 MED ORDER — SODIUM CHLORIDE 0.9 % IV SOLN: INTRAVENOUS | Status: DC

## 2020-05-13 MED ORDER — ONDANSETRON HCL 4 MG/2ML IJ SOLN
4.0000 mg | Freq: Once | INTRAMUSCULAR | Status: AC
  Administered 2020-05-13: 4 mg via INTRAVENOUS
  Filled 2020-05-13: qty 2

## 2020-05-13 MED ORDER — VITAMIN D 50 MCG (2000 UT) PO TABS: 2000.0000 [IU] | ORAL_TABLET | Freq: Two times a day (BID) | ORAL | Status: DC

## 2020-05-13 MED ORDER — CARVEDILOL 3.125 MG PO TABS
6.2500 mg | ORAL_TABLET | Freq: Two times a day (BID) | ORAL | Status: DC
  Administered 2020-05-13 – 2020-05-14 (×2): 6.25 mg via ORAL
  Filled 2020-05-13 (×3): qty 1

## 2020-05-13 MED ORDER — ALUM & MAG HYDROXIDE-SIMETH 200-200-20 MG/5ML PO SUSP
30.0000 mL | Freq: Once | ORAL | Status: AC
  Administered 2020-05-13: 30 mL via ORAL
  Filled 2020-05-13: qty 30

## 2020-05-13 MED ORDER — ONDANSETRON HCL 4 MG PO TABS: 4.0000 mg | ORAL_TABLET | Freq: Four times a day (QID) | ORAL | Status: DC | PRN

## 2020-05-13 MED ORDER — ORLISTAT 60 MG PO CAPS: 60.0000 mg | ORAL_CAPSULE | Freq: Three times a day (TID) | ORAL | Status: DC

## 2020-05-13 MED ORDER — PANTOPRAZOLE SODIUM 40 MG PO TBEC
40.0000 mg | DELAYED_RELEASE_TABLET | Freq: Every day | ORAL | Status: DC
  Administered 2020-05-13 – 2020-05-14 (×2): 40 mg via ORAL
  Filled 2020-05-13 (×2): qty 1

## 2020-05-13 MED ORDER — ATORVASTATIN CALCIUM 20 MG PO TABS
40.0000 mg | ORAL_TABLET | Freq: Every day | ORAL | Status: DC
  Administered 2020-05-13: 40 mg via ORAL
  Filled 2020-05-13: qty 2

## 2020-05-13 MED ORDER — MORPHINE SULFATE (PF) 4 MG/ML IV SOLN
4.0000 mg | Freq: Once | INTRAVENOUS | Status: AC
  Administered 2020-05-13: 4 mg via INTRAVENOUS
  Filled 2020-05-13: qty 1

## 2020-05-13 MED ORDER — LIDOCAINE VISCOUS HCL 2 % MT SOLN
15.0000 mL | Freq: Once | OROMUCOSAL | Status: AC
  Administered 2020-05-13: 15 mL via ORAL
  Filled 2020-05-13: qty 15

## 2020-05-13 MED ORDER — HYDRALAZINE HCL 50 MG PO TABS
50.0000 mg | ORAL_TABLET | Freq: Three times a day (TID) | ORAL | Status: DC
  Administered 2020-05-13 – 2020-05-14 (×4): 50 mg via ORAL
  Filled 2020-05-13 (×6): qty 1

## 2020-05-13 MED ORDER — AMLODIPINE BESYLATE 5 MG PO TABS
5.0000 mg | ORAL_TABLET | Freq: Two times a day (BID) | ORAL | Status: DC
  Administered 2020-05-13 – 2020-05-14 (×2): 5 mg via ORAL
  Filled 2020-05-13 (×3): qty 1

## 2020-05-13 MED ORDER — ASPIRIN EC 81 MG PO TBEC: 81.0000 mg | DELAYED_RELEASE_TABLET | Freq: Every day | ORAL | Status: DC

## 2020-05-13 MED ORDER — ONDANSETRON HCL 4 MG/2ML IJ SOLN
4.0000 mg | Freq: Four times a day (QID) | INTRAMUSCULAR | Status: DC | PRN
  Administered 2020-05-14 – 2020-05-16 (×2): 4 mg via INTRAVENOUS
  Filled 2020-05-13 (×2): qty 2

## 2020-05-13 MED ORDER — ENOXAPARIN SODIUM 40 MG/0.4ML ~~LOC~~ SOLN
40.0000 mg | SUBCUTANEOUS | Status: DC
  Administered 2020-05-13: 40 mg via SUBCUTANEOUS
  Filled 2020-05-13: qty 0.4

## 2020-05-13 MED ORDER — PIPERACILLIN-TAZOBACTAM 3.375 G IVPB
3.3750 g | Freq: Three times a day (TID) | INTRAVENOUS | Status: DC
  Administered 2020-05-13 – 2020-05-17 (×12): 3.375 g via INTRAVENOUS
  Filled 2020-05-13 (×14): qty 50

## 2020-05-13 MED ORDER — LACTATED RINGERS IV BOLUS
1000.0000 mL | Freq: Once | INTRAVENOUS | Status: AC
  Administered 2020-05-13: 1000 mL via INTRAVENOUS

## 2020-05-13 MED ORDER — MORPHINE SULFATE (PF) 2 MG/ML IV SOLN
2.0000 mg | INTRAVENOUS | Status: DC | PRN
  Administered 2020-05-14 – 2020-05-15 (×4): 2 mg via INTRAVENOUS
  Filled 2020-05-13 (×4): qty 1

## 2020-05-13 MED ORDER — PIPERACILLIN-TAZOBACTAM 3.375 G IVPB
3.3750 g | Freq: Once | INTRAVENOUS | Status: AC
  Administered 2020-05-13: 3.375 g via INTRAVENOUS
  Filled 2020-05-13: qty 50

## 2020-05-13 MED ORDER — FLECAINIDE ACETATE 100 MG PO TABS
100.0000 mg | ORAL_TABLET | Freq: Two times a day (BID) | ORAL | Status: DC | PRN
  Filled 2020-05-13: qty 1

## 2020-05-13 NOTE — H&P (Addendum)
History and Physical    Whitney [name] BPZ:[id number] DOB: July 26, 1951 DOA: 05/13/2020  PCP: Ronnell Freshwater, NP   Patient coming from: Home  I have personally briefly reviewed patient's old medical records in [place]  Chief Complaint: Abdominal pain  HPI: Whitney [name] is a 69 y.o. female with medical history significant for status post left-sided nephrectomy, chronic kidney disease stage III with baseline serum creatinine of about 1.2, depression and hypertension who presents to the emergency room for evaluation of intermittent left-sided abdominal pain which she has had for couple of days. Patient was seen at [facility] ER when the pain started about a week ago but was discharged home. She states that the pain is intermittent but seems to have worsened over the last couple of days and is mostly over the left lower quadrant with radiation towards the left flank. Abdominal pain is associated with nausea and vomiting but she denies having any changes in her bowel habits or fever. She has had no chills. She denies having any urinary symptoms, no cough, no dizziness, no lightheadedness, no diaphoresis or palpitations. Labs show sodium 138, potassium 4.2, chloride 105, bicarb 24, glucose 102, BUN 27, creatinine 1.67, calcium 8.6, troponin IX, hemoglobin 12.8, hematocrit 38.6, MCV 89.4, RDW 12.1, platelet count 293, Respiratory viral panel is still pending at the time of this history and physical CT renal study shows moderate uncomplicated sigmoid diverticulitis. Superimposed severe sigmoid diverticulosis given marked circumferential focal thickening of the bowel and apparent shouldering, an underlying annular mass is difficult to exclude and correlation with recent screening colonoscopy is recommended Chest x-ray reviewed by me shows no acute cardiopulmonary process Twelve-lead EKG reviewed by me shows sinus tachycardia with LVH   ED Course: Patient is a 69 year old female who presents to the  ER for evaluation of worsening left-sided abdominal pain associated with nausea and vomiting. Imaging is suggestive of acute diverticulitis with severe diverticulosis and inability to exclude an annular mass. Patient will be admitted to the hospital for further evaluation.  Review of Systems: As per HPI otherwise 10 point review of systems negative.    Past Medical History:  Diagnosis Date  . Abnormal Pap smear of cervix    Pt states she had colposcopy   . Anxiety   . Arthritis   . Chronic kidney disease    removed left kidney  . Depression   . Dyspnea   . Dysrhythmia   . Hx of unilateral nephrectomy 1979   Left Nephrectomy  . Hypertension   . Lower extremity edema   . Palpitations   . PMB (postmenopausal bleeding)   . Restless leg syndrome   . Sleep apnea    has no cpap  . Vitamin D deficiency     Past Surgical History:  Procedure Laterality Date  . BIOPSY  10/31/2019   Procedure: BIOPSY;  Surgeon: Rush Landmark Telford Nab., MD;  Location: [place];  Service: Gastroenterology;;  . CARDIAC CATHETERIZATION    . CHOLECYSTECTOMY    . COLONOSCOPY WITH PROPOFOL N/A 06/13/2019   Procedure: COLONOSCOPY WITH PROPOFOL;  Surgeon: [name], MD;  Location: [facility] ENDOSCOPY;  Service: Gastroenterology;  Laterality: N/A;  . COLONOSCOPY WITH PROPOFOL N/A 08/12/2019   Procedure: COLONOSCOPY WITH PROPOFOL;  Surgeon: [name], MD;  Location: [facility] ENDOSCOPY;  Service: Gastroenterology;  Laterality: N/A;  . COLONOSCOPY WITH PROPOFOL N/A 09/19/2019   Procedure: COLONOSCOPY WITH PROPOFOL;  Surgeon: [name], MD;  Location: [facility] ENDOSCOPY;  Service: Gastroenterology;  Laterality: N/A;  .  DILATION AND CURETTAGE OF UTERUS    . ESOPHAGOGASTRODUODENOSCOPY (EGD) WITH PROPOFOL N/A 06/13/2019   Procedure: ESOPHAGOGASTRODUODENOSCOPY (EGD) WITH PROPOFOL;  Surgeon: [name], MD;  Location: [facility] ENDOSCOPY;  Service: Gastroenterology;  Laterality: N/A;  Pt will go for COVID test on 06-08-19 as she uses  public transportation and will be in the area on that day.   . ESOPHAGOGASTRODUODENOSCOPY (EGD) WITH PROPOFOL N/A 10/31/2019   Procedure: ESOPHAGOGASTRODUODENOSCOPY (EGD) WITH PROPOFOL;  Surgeon: Rush Landmark Telford Nab., MD;  Location: [place];  Service: Gastroenterology;  Laterality: N/A;  . HEMOSTASIS CLIP PLACEMENT  10/31/2019   Procedure: HEMOSTASIS CLIP PLACEMENT;  Surgeon: Irving Copas., MD;  Location: [place];  Service: Gastroenterology;;  . HYSTEROSCOPY WITH D & C N/A 02/16/2017   Procedure: DILATATION AND CURETTAGE [place];  Surgeon: Brayton Mars, MD;  Location: [facility];  Service: Gynecology;  Laterality: N/A;  . JOINT REPLACEMENT    . kidney removal Left   . KIDNEY SURGERY Left 1979   left kidney removed  . LEFT HEART CATH AND CORONARY ANGIOGRAPHY N/A 10/21/2017   Procedure: LEFT HEART CATH AND CORONARY ANGIOGRAPHY;  Surgeon: Teodoro Spray, MD;  Location: [place] [facility];  Service: Cardiovascular;  Laterality: N/A;  . POLYPECTOMY  10/31/2019   Procedure: POLYPECTOMY;  Surgeon: Rush Landmark Telford Nab., MD;  Location: [place];  Service: Gastroenterology;;  . TOTAL KNEE ARTHROPLASTY Left 07/06/2017   Procedure: TOTAL KNEE ARTHROPLASTY;  Surgeon: Lovell Sheehan, MD;  Location: [facility];  Service: Orthopedics;  Laterality: Left;  . UPPER ESOPHAGEAL ENDOSCOPIC ULTRASOUND (EUS) N/A 10/31/2019   Procedure: UPPER ESOPHAGEAL ENDOSCOPIC ULTRASOUND (EUS);  Surgeon: Irving Copas., MD;  Location: [place];  Service: Gastroenterology;  Laterality: N/A;  . WRIST ARTHROSCOPY Left 1970s  . XI ROBOTIC ASSISTED VENTRAL HERNIA N/A 12/01/2019   Procedure: XI ROBOTIC ASSISTED VENTRAL HERNIA;  Surgeon: Jules Husbands, MD;  Location: [facility];  Service: General;  Laterality: N/A;     reports that she has never smoked. She has never used smokeless tobacco. She reports current alcohol use of about 2.0 standard drinks of alcohol per week. She reports  current drug use. Frequency: 3.00 times per week. Drugs: Marijuana and Cocaine.  Allergies  Allergen Reactions  . Enalapril Swelling  . Lisinopril Swelling    Family History  Problem Relation Age of Onset  . Ovarian cancer Mother   . Hypertension Father   . Diabetes Father   . Cancer Father   . Breast cancer Sister   . Diabetes Sister      Prior to Admission medications   Medication Sig Start Date End Date Taking? Authorizing Provider  amLODipine (NORVASC) 5 MG tablet Take 1 tablet (5 mg total) by mouth 2 (two) times daily. 01/25/20   Ronnell Freshwater, NP  aspirin EC 81 MG tablet Take 81 mg by mouth daily.    [provider]  atorvastatin (LIPITOR) 40 MG tablet Take 1 tablet (40 mg total) by mouth daily at 6 PM. Patient taking differently: Take 40 mg by mouth at bedtime.  09/29/19   Kendell Bane, NP  carvedilol (COREG) 3.125 MG tablet TAKE 2 TABLETS(6.25 MG) BY MOUTH TWICE DAILY WITH A MEAL Patient taking differently: Take 6.25 mg by mouth in the morning and at bedtime.  08/30/19   Lavera Guise, MD  Cholecalciferol (VITAMIN D) 50 MCG (2000 UT) tablet Take 2,000 Units by mouth in the morning and at bedtime.     [provider]  diclofenac Sodium (VOLTAREN) 1 % GEL Apply 1 application topically 4 (four) times daily as needed (pain).     [provider]  flecainide (TAMBOCOR) 100 MG tablet Take 100 mg by mouth 2 (two) times daily as needed (palpitations).     [provider]  hydrALAZINE (APRESOLINE) 50 MG tablet Take 50 mg by mouth 3 (three) times daily. 01/19/20   [provider]  omeprazole (PRILOSEC) 40 MG capsule Take 1 capsule (40 mg total) by mouth 2 (two) times daily before a meal. For 1 month.  Then decrease to once daily dosing thereafter.  Please take before meals. 10/31/19 12/30/19  Mansouraty, Telford Nab., MD  orlistat (ALLI) 60 MG capsule Take 60 mg by mouth 3 (three) times daily with meals.    [provider]  OVER THE  COUNTER MEDICATION Apply 1 application topically in the morning and at bedtime. Tummy cream    [provider]  traMADol (ULTRAM) 50 MG tablet Take one tab po bid for pain 05/10/20   Lavera Guise, MD  traZODone (DESYREL) 50 MG tablet Take 0.5 tablets (25 mg total) by mouth at bedtime as needed for sleep. 03/06/20   Lavera Guise, MD    Physical Exam: Vitals:   05/13/20 0335 05/13/20 0651 05/13/20 0935 05/13/20 0936  BP: (!) 132/94 (!) 131/93  (!) 141/106  Pulse: 95 96 90 90  Resp: 20 (!) 22 18 18   Temp: 98.1 F (36.7 C) 98.2 F (36.8 C)    TempSrc: Oral Oral    SpO2: 95% 97%  100%  Weight:      Height:         Vitals:   05/13/20 0335 05/13/20 0651 05/13/20 0935 05/13/20 0936  BP: (!) 132/94 (!) 131/93  (!) 141/106  Pulse: 95 96 90 90  Resp: 20 (!) 22 18 18   Temp: 98.1 F (36.7 C) 98.2 F (36.8 C)    TempSrc: Oral Oral    SpO2: 95% 97%  100%  Weight:      Height:        Constitutional: NAD, alert and oriented x 3 Eyes: PERRL, lids and conjunctivae normal ENMT: Mucous membranes are moist.  Neck: normal, supple, no masses, no thyromegaly Respiratory: clear to auscultation bilaterally, no wheezing, no crackles. Normal respiratory effort. No accessory muscle use Cardiovascular: Regular rate and rhythm,no murmurs / rubs / gallops. No extremity edema. 2+ pedal pulses. No carotid bruits.  Abdomen:  tenderness in the left lower quadrant, no masses palpated. No hepatosplenomegaly. Bowel sounds positive.  Central adiposity Musculoskeletal: no clubbing / cyanosis. No joint deformity upper and lower extremities.  Skin: no rashes, lesions, ulcers.  Neurologic: No gross focal neurologic deficit. Psychiatric: Normal mood and affect.   Labs on Admission: I have personally reviewed following labs and imaging studies  CBC: Recent Labs  Lab 05/12/20 2348  WBC 9.1  HGB 12.8  HCT 38.6  MCV 89.4  PLT 811   Basic Metabolic Panel: Recent Labs  Lab 05/12/20 2348  NA 138    K 4.2  CL 105  CO2 24  GLUCOSE 102*  BUN 27*  CREATININE 1.67*  CALCIUM 8.6*   GFR: Estimated Creatinine Clearance: 39.5 mL/min (A) (by C-G formula based on SCr of 1.67 mg/dL (H)). Liver Function Tests: No results for input(s): AST, ALT, ALKPHOS, BILITOT, PROT, ALBUMIN in the last 168 hours. No results for input(s): LIPASE, AMYLASE in the last 168 hours. No results for input(s): AMMONIA in the last 168  hours. Coagulation Profile: No results for input(s): INR, PROTIME in the last 168 hours. Cardiac Enzymes: No results for input(s): CKTOTAL, CKMB, CKMBINDEX, TROPONINI in the last 168 hours. BNP (last 3 results) No results for input(s): PROBNP in the last 8760 hours. HbA1C: No results for input(s): HGBA1C in the last 72 hours. CBG: No results for input(s): GLUCAP in the last 168 hours. Lipid Profile: No results for input(s): CHOL, HDL, LDLCALC, TRIG, CHOLHDL, LDLDIRECT in the last 72 hours. Thyroid Function Tests: No results for input(s): TSH, T4TOTAL, FREET4, T3FREE, THYROIDAB in the last 72 hours. Anemia Panel: No results for input(s): VITAMINB12, FOLATE, FERRITIN, TIBC, IRON, RETICCTPCT in the last 72 hours. Urine analysis:    Component Value Date/Time   COLORURINE YELLOW (A) 01/03/2019 0850   APPEARANCEUR Clear 10/11/2019 0000   LABSPEC 1.014 01/03/2019 0850   PHURINE 6.0 01/03/2019 0850   GLUCOSEU Negative 10/11/2019 0000   HGBUR NEGATIVE 01/03/2019 0850   BILIRUBINUR negative 05/10/2020 1112   BILIRUBINUR Negative 10/11/2019 0000   KETONESUR NEGATIVE 01/03/2019 0850   PROTEINUR Positive (A) 05/10/2020 1112   PROTEINUR 3+ (A) 10/11/2019 0000   PROTEINUR 100 (A) 01/03/2019 0850   UROBILINOGEN 0.2 05/10/2020 1112   NITRITE negative 05/10/2020 1112   NITRITE Negative 10/11/2019 0000   NITRITE NEGATIVE 01/03/2019 0850   LEUKOCYTESUR Negative 05/10/2020 1112   LEUKOCYTESUR Negative 10/11/2019 0000   LEUKOCYTESUR NEGATIVE 01/03/2019 0850    Radiological Exams on  Admission: DG Chest 2 View  Result Date: 05/13/2020 CLINICAL DATA:  Chest pain. EXAM: CHEST - 2 VIEW COMPARISON:  Dec 19, 2017 FINDINGS: The heart size and mediastinal contours are within normal limits. Both lungs are clear. The visualized skeletal structures are unremarkable. Moderate degenerative changes are noted of the left glenohumeral joint. IMPRESSION: No acute cardiopulmonary process. Electronically Signed   By: Constance Holster M.D.   On: 05/13/2020 00:38   CT Renal Stone Study  Result Date: 05/13/2020 CLINICAL DATA:  Left flank pain, acute renal insufficiency EXAM: CT ABDOMEN AND PELVIS WITHOUT CONTRAST TECHNIQUE: Multidetector CT imaging of the abdomen and pelvis was performed following the standard protocol without IV contrast. COMPARISON:  05/27/2019 FINDINGS: Lower chest: The visualized lung bases are clear bilaterally. Extensive coronary artery calcification. Global cardiac size is within normal limits. Tiny hiatal hernia. Hepatobiliary: Simple cyst noted within the right hepatic lobe. Cholecystectomy has been performed. No intra or extrahepatic biliary ductal dilation. Pancreas: Unremarkable Spleen: Unremarkable Adrenals/Urinary Tract: The adrenal glands are unremarkable. Left nephrectomy has been performed. The right kidney is normal in position and demonstrates mild compensatory hypertrophy. No intrarenal or ureteral calculi are identified. No hydronephrosis. The bladder is unremarkable. Stomach/Bowel: There is severe distal descending and sigmoid colonic diverticulosis. There are superimposed extensive pericolonic inflammatory changes noted involving the proximal sigmoid colon within the left lower quadrant of the abdomen in keeping with acute sigmoid diverticulitis. There is no free intraperitoneal gas. Trace free fluid within the left pericolic gutter. No loculated intra-abdominal fluid collections are seen. There is no evidence of obstruction. There is notable shouldering noted, best  appreciated on coronal image # 90/5 and a annular colonic mass, while considered less likely, is not completely excluded on this examination. The stomach, small bowel, and large bowel are otherwise unremarkable. Appendix normal. Vascular/Lymphatic: Moderate aortoiliac atherosclerotic calcification is present. No aortic aneurysm. No pathologic adenopathy within the abdomen and pelvis. Reproductive: Uterus and bilateral adnexa are unremarkable. Other: Rectum unremarkable. Musculoskeletal: Degenerative changes are seen throughout the lumbar spine. No lytic or  blastic bone lesions are seen. IMPRESSION: Moderate uncomplicated sigmoid diverticulitis. Superimposed severe sigmoid diverticulosis. Given marked circumferential focal thickening of the bowel and apparent shouldering, an underlying annular mass is difficult to exclude and correlation with recent screening colonoscopy is recommended. Aortic Atherosclerosis (ICD10-I70.0). Electronically Signed   By: Fidela Salisbury MD   On: 05/13/2020 04:14    EKG: Independently reviewed.  Sinus tachycardia with LVH  Assessment/Plan Principal Problem:   Acute diverticulitis Active Problems:   Obesity (BMI 35.0-39.9 without comorbidity)   Paroxysmal atrial fibrillation (HCC)   Obstructive sleep apnea     Acute sigmoid diverticulitis Patient presents for evaluation of left-sided abdominal pain and had a CAT scan of the abdomen and pelvis which shows diverticulitis of the sigmoid colon with severe diverticulosis We will place patient on IV antibiotics with Zosyn Supportive care and pain control and antiemetics Keep patient n.p.o. for now We will request GI consult for abnormal CAT scan findings ?? Annular mass lesion. Patient had a colonoscopy done in February, 2021 and was suboptimal because of poor prep, she is scheduled for repeat colonoscopy on 06/04/20    Paroxysmal A. Fib Continue carvedilol and flecainide for rate control Patient is not on long-term  anticoagulation Will defer initiation of long-term anticoagulation to cardiology    Morbid obesity Complicates overall prognosis and care   Hypertension Continue carvedilol, hydralazine and amlodipine   Dehydration Secondary to poor oral intake and GI losses from nausea and vomiting Patient has chronic kidney disease stage III admitted a baseline serum creatinine of 1.2 but today on admission was 1.6 IV fluid hydration Repeat renal parameters in a.m.  DVT prophylaxis: Lovenox Code Status: Full code Family Communication: Greater than 50% of time was spent discussing patient's condition and plan of care.  All questions and concerns have been addressed.  She verbalizes understanding and agrees with the plan. Disposition Plan: Back to previous home environment Consults called: Gastroenterology    Collier Bullock MD [facility]     05/13/2020, 11:02 AM

## 2020-05-13 NOTE — ED Provider Notes (Signed)
[facility] Emergency Department Provider Note   ____________________________________________   First MD Initiated Contact with Patient 05/13/20 [phone]     (approximate)  I have reviewed the triage vital signs and the nursing notes.   HISTORY  Chief Complaint Chest Pain and Abdominal Pain    HPI LUCINDY BOREL is a 69 y.o. female history of left-sided nephrectomy, depression, chronic kidney disease  Patient reports for about a week now she been having intermittent left-sided abdominal pain she took spoke to her primary care doctor about it, also was seen at [facility] ER when symptoms started.  She has improved at times, but over the last few days seen beating worse with a persistent increasing pain mostly over the left mid abdomen radiating some towards the left flank and then also last night starting to radiate towards her left lower chest.  Associated with nausea and has vomited yellow a couple of times.  Still passing gas, reports no significant diarrhea though.  No fevers.  No difficulty breathing.  Reports chest pain is pain that seems to come from the abdomen and does not radiate to the neck or arms   Past Medical History:  Diagnosis Date  . Abnormal Pap smear of cervix    Pt states she had colposcopy   . Anxiety   . Arthritis   . Chronic kidney disease    removed left kidney  . Depression   . Dyspnea   . Dysrhythmia   . Hx of unilateral nephrectomy 1979   Left Nephrectomy  . Hypertension   . Lower extremity edema   . Palpitations   . PMB (postmenopausal bleeding)   . Restless leg syndrome   . Sleep apnea    has no cpap  . Vitamin D deficiency     Patient Active Problem List   Diagnosis Date Noted  . Acute diverticulitis 05/13/2020  . Fever blister 10/15/2019  . Cerumen in auditory canal on examination 10/15/2019  . Bilateral hearing loss 10/15/2019  . Need for vaccination against Streptococcus pneumoniae using pneumococcal conjugate  vaccine 13 10/15/2019  . Encounter for screening mammogram for malignant neoplasm of breast 10/15/2019  . Screening for osteoporosis 10/15/2019  . Elevated erythrocyte sedimentation rate 11/02/2018  . Lumbar spondylosis 11/02/2018  . PVC's (premature ventricular contractions) 10/18/2018  . Primary osteoarthritis of right knee 09/15/2018  . Encounter for pre-operative examination 09/15/2018  . Obstructive sleep apnea 09/15/2018  . Calculus of gallbladder without cholecystitis without obstruction 09/15/2018  . Calculus of gallbladder with cholecystitis without biliary obstruction 01/27/2018  . Restless leg syndrome 01/27/2018  . Generalized abdominal pain 01/11/2018  . Cyst of pancreas 01/11/2018  . Other specified disorders of kidney and ureter 01/11/2018  . Primary insomnia 01/11/2018  . Paroxysmal atrial fibrillation ([place]) 11/12/2017  . Essential hypertension 10/25/2017  . Mixed hyperlipidemia 10/25/2017  . Low back pain at multiple sites 10/25/2017  . Dysuria 10/25/2017  . Encounter for general adult medical examination with abnormal findings 10/25/2017  . Vitamin D deficiency 10/25/2017  . Near syncope   . Symptomatic bradycardia 10/19/2017  . Carpal tunnel syndrome 10/12/2017  . Primary osteoarthritis of left knee 07/09/2017  . History of total knee arthroplasty 07/06/2017  . Chest pain 06/29/2017  . Postop check 02/25/2017  . Impingement syndrome of shoulder region 01/20/2017  . Neck pain 01/20/2017  . Obesity (BMI 35.0-39.9 without comorbidity) 07/15/2016  . Endometrial polyp 07/15/2016  . Morbid obesity ([place]) 07/15/2016  . Family history of breast cancer  in first degree relative 07/15/2016  . Family history of ovarian cancer 07/15/2016  . Knee pain 07/04/2016  . Bilateral leg pain 06/24/2016    Past Surgical History:  Procedure Laterality Date  . BIOPSY  10/31/2019   Procedure: BIOPSY;  Surgeon: Rush Landmark Telford Nab., MD;  Location: [place];  Service:  Gastroenterology;;  . CARDIAC CATHETERIZATION    . CHOLECYSTECTOMY    . COLONOSCOPY WITH PROPOFOL N/A 06/13/2019   Procedure: COLONOSCOPY WITH PROPOFOL;  Surgeon: [name], MD;  Location: [facility] ENDOSCOPY;  Service: Gastroenterology;  Laterality: N/A;  . COLONOSCOPY WITH PROPOFOL N/A 08/12/2019   Procedure: COLONOSCOPY WITH PROPOFOL;  Surgeon: [name], MD;  Location: [facility] ENDOSCOPY;  Service: Gastroenterology;  Laterality: N/A;  . COLONOSCOPY WITH PROPOFOL N/A 09/19/2019   Procedure: COLONOSCOPY WITH PROPOFOL;  Surgeon: [name], MD;  Location: [facility] ENDOSCOPY;  Service: Gastroenterology;  Laterality: N/A;  . DILATION AND CURETTAGE OF UTERUS    . ESOPHAGOGASTRODUODENOSCOPY (EGD) WITH PROPOFOL N/A 06/13/2019   Procedure: ESOPHAGOGASTRODUODENOSCOPY (EGD) WITH PROPOFOL;  Surgeon: [name], MD;  Location: [facility] ENDOSCOPY;  Service: Gastroenterology;  Laterality: N/A;  Pt will go for COVID test on 08-10-49 as she uses public transportation and will be in the area on that day.   . ESOPHAGOGASTRODUODENOSCOPY (EGD) WITH PROPOFOL N/A 10/31/2019   Procedure: ESOPHAGOGASTRODUODENOSCOPY (EGD) WITH PROPOFOL;  Surgeon: Rush Landmark Telford Nab., MD;  Location: [place];  Service: Gastroenterology;  Laterality: N/A;  . HEMOSTASIS CLIP PLACEMENT  10/31/2019   Procedure: HEMOSTASIS CLIP PLACEMENT;  Surgeon: Irving Copas., MD;  Location: [place];  Service: Gastroenterology;;  . HYSTEROSCOPY WITH D & C N/A 02/16/2017   Procedure: DILATATION AND CURETTAGE [place];  Surgeon: Brayton Mars, MD;  Location: [facility] ORS;  Service: Gynecology;  Laterality: N/A;  . JOINT REPLACEMENT    . kidney removal Left   . KIDNEY SURGERY Left 1979   left kidney removed  . LEFT HEART CATH AND CORONARY ANGIOGRAPHY N/A 10/21/2017   Procedure: LEFT HEART CATH AND CORONARY ANGIOGRAPHY;  Surgeon: Teodoro Spray, MD;  Location: [place] CV LAB;  Service: Cardiovascular;  Laterality: N/A;  . POLYPECTOMY   10/31/2019   Procedure: POLYPECTOMY;  Surgeon: Rush Landmark Telford Nab., MD;  Location: [place];  Service: Gastroenterology;;  . TOTAL KNEE ARTHROPLASTY Left 07/06/2017   Procedure: TOTAL KNEE ARTHROPLASTY;  Surgeon: Lovell Sheehan, MD;  Location: [facility] ORS;  Service: Orthopedics;  Laterality: Left;  . UPPER ESOPHAGEAL ENDOSCOPIC ULTRASOUND (EUS) N/A 10/31/2019   Procedure: UPPER ESOPHAGEAL ENDOSCOPIC ULTRASOUND (EUS);  Surgeon: Irving Copas., MD;  Location: [place];  Service: Gastroenterology;  Laterality: N/A;  . WRIST ARTHROSCOPY Left 1970s  . XI ROBOTIC ASSISTED VENTRAL HERNIA N/A 12/01/2019   Procedure: XI ROBOTIC ASSISTED VENTRAL HERNIA;  Surgeon: Jules Husbands, MD;  Location: [facility] ORS;  Service: General;  Laterality: N/A;    Prior to Admission medications   Medication Sig Start Date End Date Taking? Authorizing Provider  amLODipine (NORVASC) 5 MG tablet Take 1 tablet (5 mg total) by mouth 2 (two) times daily. 01/25/20   Ronnell Freshwater, NP  aspirin EC 81 MG tablet Take 81 mg by mouth daily.    [provider]  atorvastatin (LIPITOR) 40 MG tablet Take 1 tablet (40 mg total) by mouth daily at 6 PM. Patient taking differently: Take 40 mg by mouth at bedtime.  09/29/19   Kendell Bane, NP  carvedilol (COREG) 3.125 MG tablet TAKE 2 TABLETS(6.25 MG) BY MOUTH TWICE DAILY WITH  A MEAL Patient taking differently: Take 6.25 mg by mouth in the morning and at bedtime.  08/30/19   Lavera Guise, MD  Cholecalciferol (VITAMIN D) 50 MCG (2000 UT) tablet Take 2,000 Units by mouth in the morning and at bedtime.     [provider]  diclofenac Sodium (VOLTAREN) 1 % GEL Apply 1 application topically 4 (four) times daily as needed (pain).     [provider]  flecainide (TAMBOCOR) 100 MG tablet Take 100 mg by mouth 2 (two) times daily as needed (palpitations).     [provider]  gabapentin (NEURONTIN) 100 MG capsule Take 1 capsule (100 mg total) by mouth  2 (two) times daily. Patient taking differently: Take 100 mg by mouth 2 (two) times daily as needed (pain).  10/22/17   Loletha Grayer, MD  hydrALAZINE (APRESOLINE) 25 MG tablet Take 1 tablet (25 mg total) by mouth 3 (three) times daily. 06/28/19   Kendell Bane, NP  HYDROcodone-acetaminophen (NORCO/VICODIN) 5-325 MG tablet Take 1-2 tablets by mouth every 6 (six) hours as needed for moderate pain. 12/01/19   Pabon, Marjory Lies, MD  meclizine (ANTIVERT) 25 MG tablet Take 1 tablet (25 mg total) by mouth 2 (two) times daily as needed for dizziness. 06/28/19   Kendell Bane, NP  omeprazole (PRILOSEC) 40 MG capsule Take 1 capsule (40 mg total) by mouth 2 (two) times daily before a meal. For 1 month.  Then decrease to once daily dosing thereafter.  Please take before meals. 10/31/19 12/30/19  Mansouraty, Telford Nab., MD  ondansetron (ZOFRAN-ODT) 4 MG disintegrating tablet Take 1 tablet (4 mg total) by mouth every 8 (eight) hours as needed for nausea or vomiting. 12/14/19   Pabon, Diego F, MD  orlistat (ALLI) 60 MG capsule Take 60 mg by mouth 3 (three) times daily with meals.    [provider]  OVER THE COUNTER MEDICATION Apply 1 application topically in the morning and at bedtime. Tummy cream    [provider]  rOPINIRole (REQUIP) 0.5 MG tablet Takes 1 or 2 tablets daily as needed Patient taking differently: Take 0.5 mg by mouth 2 (two) times daily as needed (restless legs).  01/11/18   Ronnell Freshwater, NP  traMADol Veatrice Bourbon) 50 MG tablet Take one tab po bid for pain 05/10/20   Lavera Guise, MD  traZODone (DESYREL) 50 MG tablet Take 0.5 tablets (25 mg total) by mouth at bedtime as needed for sleep. 03/06/20   Lavera Guise, MD    Allergies Enalapril and Lisinopril  Family History  Problem Relation Age of Onset  . Ovarian cancer Mother   . Hypertension Father   . Diabetes Father   . Cancer Father   . Breast cancer Sister   . Diabetes Sister     Social History Social History    Tobacco Use  . Smoking status: Never Smoker  . Smokeless tobacco: Never Used  Vaping Use  . Vaping Use: Never used  Substance Use Topics  . Alcohol use: Yes    Alcohol/week: 2.0 standard drinks    Types: 2 Standard drinks or equivalent per week    Comment: occasionally  . Drug use: Yes    Frequency: 3.0 times per week    Types: Marijuana, Cocaine    Comment: Pt states she uses for leg pain: marijuana; states no longer uses cocaine    Review of Systems Constitutional: No fever/chills Eyes: No visual changes. ENT: No sore throat. Cardiovascular: See HPI Respiratory:  Denies shortness of breath. Gastrointestinal: See HPI.   Genitourinary: Negative for dysuria.  Urinating less only a small amount last night Musculoskeletal: Negative for back pain. Skin: Negative for rash. Neurological: Negative for headaches, areas of focal weakness or numbness.    ____________________________________________   PHYSICAL EXAM:  VITAL SIGNS: ED Triage Vitals [05/12/20 2339]  Enc Vitals Group     BP (!) 107/93     Pulse Rate (!) 114     Resp 20     Temp 98.3 F (36.8 C)     Temp Source Oral     SpO2 100 %     Weight 245 lb (111.1 kg)     Height 5\' 5"  (1.651 m)     Head Circumference      Peak Flow      Pain Score 7     Pain Loc      Pain Edu?      Excl. in [place]?     Constitutional: Alert and oriented.  Appears in pain, somewhat wincing holding her hand over her left flank and abdomen Eyes: Conjunctivae are normal. Head: Atraumatic. Nose: No congestion/rhinnorhea. Mouth/Throat: Mucous membranes are dry. Neck: No stridor.  Cardiovascular: Normal rate, regular rhythm. Grossly normal heart sounds.  Good peripheral circulation. Respiratory: Normal respiratory effort.  No retractions. Lungs CTAB. Gastrointestinal: Soft and nontender except she is quite tender with some rebound tenderness primarily over the left lower quadrant some along the left flank. No  distention. Musculoskeletal: No lower extremity tenderness nor edema. Neurologic:  Normal speech and language. No gross focal neurologic deficits are appreciated.  Skin:  Skin is warm, dry and intact. No rash noted. Psychiatric: Mood and affect are normal. Speech and behavior are normal.  ____________________________________________   LABS (all labs ordered are listed, but only abnormal results are displayed)  Labs Reviewed  BASIC METABOLIC PANEL - Abnormal; Notable for the following components:      Result Value   Glucose, Bld 102 (*)    BUN 27 (*)    Creatinine, Ser 1.67 (*)    Calcium 8.6 (*)    GFR, Estimated 31 (*)    All other components within normal limits  RESPIRATORY PANEL BY RT PCR (FLU A&B, COVID)  CBC  HEPATIC FUNCTION PANEL  LIPASE, BLOOD  URINALYSIS, COMPLETE (UACMP) WITH MICROSCOPIC  TROPONIN I (HIGH SENSITIVITY)  TROPONIN I (HIGH SENSITIVITY)   ____________________________________________  EKG  Reviewed interpreted 2335 Heart rate 100 QRS 80 QTc 440 Sinus tachycardia, possible LVH.  Minimal nonspecific T wave abnormality ____________________________________________  RADIOLOGY  DG Chest 2 View  Result Date: 05/13/2020 CLINICAL DATA:  Chest pain. EXAM: CHEST - 2 VIEW COMPARISON:  Dec 19, 2017 FINDINGS: The heart size and mediastinal contours are within normal limits. Both lungs are clear. The visualized skeletal structures are unremarkable. Moderate degenerative changes are noted of the left glenohumeral joint. IMPRESSION: No acute cardiopulmonary process. Electronically Signed   By: Constance Holster M.D.   On: 05/13/2020 00:38   CT Renal Stone Study  Result Date: 05/13/2020 CLINICAL DATA:  Left flank pain, acute renal insufficiency EXAM: CT ABDOMEN AND PELVIS WITHOUT CONTRAST TECHNIQUE: Multidetector CT imaging of the abdomen and pelvis was performed following the standard protocol without IV contrast. COMPARISON:  05/27/2019 FINDINGS: Lower chest:  The visualized lung bases are clear bilaterally. Extensive coronary artery calcification. Global cardiac size is within normal limits. Tiny hiatal hernia. Hepatobiliary: Simple cyst noted within the right hepatic lobe. Cholecystectomy has been performed. No intra  or extrahepatic biliary ductal dilation. Pancreas: Unremarkable Spleen: Unremarkable Adrenals/Urinary Tract: The adrenal glands are unremarkable. Left nephrectomy has been performed. The right kidney is normal in position and demonstrates mild compensatory hypertrophy. No intrarenal or ureteral calculi are identified. No hydronephrosis. The bladder is unremarkable. Stomach/Bowel: There is severe distal descending and sigmoid colonic diverticulosis. There are superimposed extensive pericolonic inflammatory changes noted involving the proximal sigmoid colon within the left lower quadrant of the abdomen in keeping with acute sigmoid diverticulitis. There is no free intraperitoneal gas. Trace free fluid within the left pericolic gutter. No loculated intra-abdominal fluid collections are seen. There is no evidence of obstruction. There is notable shouldering noted, best appreciated on coronal image # 90/5 and a annular colonic mass, while considered less likely, is not completely excluded on this examination. The stomach, small bowel, and large bowel are otherwise unremarkable. Appendix normal. Vascular/Lymphatic: Moderate aortoiliac atherosclerotic calcification is present. No aortic aneurysm. No pathologic adenopathy within the abdomen and pelvis. Reproductive: Uterus and bilateral adnexa are unremarkable. Other: Rectum unremarkable. Musculoskeletal: Degenerative changes are seen throughout the lumbar spine. No lytic or blastic bone lesions are seen. IMPRESSION: Moderate uncomplicated sigmoid diverticulitis. Superimposed severe sigmoid diverticulosis. Given marked circumferential focal thickening of the bowel and apparent shouldering, an underlying annular  mass is difficult to exclude and correlation with recent screening colonoscopy is recommended. Aortic Atherosclerosis (ICD10-I70.0). Electronically Signed   By: Fidela Salisbury MD   On: 05/13/2020 04:14    Imaging reviewed, concern for sigmoid diverticulitis.  Reviewed by me.  Also severe sigmoid diverticulosis.  Also concern for possible circumferential wall thickening ____________________________________________   PROCEDURES  Procedure(s) performed: None  Procedures  Critical Care performed: No  ____________________________________________   INITIAL IMPRESSION / ASSESSMENT AND PLAN / ED COURSE  Pertinent labs & imaging results that were available during my care of the patient were reviewed by me and considered in my medical decision making (see chart for details).   Differential diagnosis includes but is not limited to, abdominal perforation, aortic dissection, cholecystitis, appendicitis, diverticulitis, colitis, esophagitis/gastritis, kidney stone, pyelonephritis, urinary tract infection, aortic aneurysm. All are considered in decision and treatment plan. Based upon the patient's presentation and risk factors, as this appears to be most consistent with diverticulitis given clinical history exam and CT.  Associated with this appears to be acute kidney injury likely prerenal in nature.  I suspect based on the clinical history as well as reassuring EKG and troponin that her left-sided chest pain is referred from the abdomen.  It is very low on the left side and she reports the pain to be right at the costal margin and left upper abdomen rating from the left lower and flank  Admission discussed with Dr. Francine Graven  Patient understand agreeable plan for admission.       ____________________________________________   FINAL CLINICAL IMPRESSION(S) / ED DIAGNOSES  Final diagnoses:  Sigmoid diverticulitis  AKI (acute kidney injury) ([place])        Note:  This document was prepared  using Dragon voice recognition software and may include unintentional dictation errors       Delman Kitten, MD 05/13/20 1025

## 2020-05-13 NOTE — ED Notes (Signed)
IVF and IV antibiotics switched to new IV in right forearm.

## 2020-05-13 NOTE — Consult Note (Signed)
PHARMACY -  BRIEF ANTIBIOTIC NOTE   Pharmacy has received consult(s) for Zosyn for Intra-abdominal infection from an ED provider.  The patient's profile has been reviewed for ht/wt/allergies/indication/available labs.    One time order(s) placed for Zosyn 3.375g IV extended infusion  Further antibiotics/pharmacy consults should be ordered by admitting physician if indicated.                       Thank you, Forde Dandy Orrie Schubert 05/13/2020  10:17 AM

## 2020-05-13 NOTE — Consult Note (Signed)
Pharmacy Antibiotic Note  Whitney [name] is a 69 y.o. female admitted on 05/13/2020 with intra-abdominal infection. Patient presented to ED with abdominal pain. PMH significant for s/p L sided nephrectomy, CKDIIIa (baseline Scr 1.2), depression, and HTN. Pharmacy has been consulted for Zosyn dosing.  Day 1 IV abx; Scr 1.67; WBC 9.1; afebrile  Plan: Will give Zosyn 3.375g IV q8h (4 hour infusion) Continue to monitor renal function and adjust dose as clinically indicated.  Height: 5\' 5"  (165.1 cm) Weight: 111.1 kg (245 lb) IBW/kg (Calculated) : 57  Temp (24hrs), Avg:98.2 F (36.8 C), Min:98.1 F (36.7 C), Max:98.3 F (36.8 C)  Recent Labs  Lab 05/12/20 2348  WBC 9.1  CREATININE 1.67*    Estimated Creatinine Clearance: 39.5 mL/min (A) (by C-G formula based on SCr of 1.67 mg/dL (H)).    Allergies  Allergen Reactions  . Enalapril Swelling  . Lisinopril Swelling    Antimicrobials this admission: 10/10 Zosyn >>   Dose adjustments this admission: N/A  Microbiology results: BCx: UCx:  Sputum:  MRSA PCR:  Thank you for allowing pharmacy to be a part of this patient's care.  Sherilyn Banker, PharmD Pharmacy Resident  05/13/2020 12:12 PM

## 2020-05-14 ENCOUNTER — Ambulatory Visit: Payer: Medicare Other

## 2020-05-14 ENCOUNTER — Encounter: Payer: Self-pay | Admitting: Internal Medicine

## 2020-05-14 DIAGNOSIS — K5792 Diverticulitis of intestine, part unspecified, without perforation or abscess without bleeding: Secondary | ICD-10-CM | POA: Diagnosis not present

## 2020-05-14 DIAGNOSIS — K639 Disease of intestine, unspecified: Secondary | ICD-10-CM | POA: Diagnosis not present

## 2020-05-14 DIAGNOSIS — K5732 Diverticulitis of large intestine without perforation or abscess without bleeding: Principal | ICD-10-CM

## 2020-05-14 LAB — CBC
HCT: 34.1 % — ABNORMAL LOW (ref 36.0–46.0)
Hemoglobin: 11 g/dL — ABNORMAL LOW (ref 12.0–15.0)
MCH: 29.4 pg (ref 26.0–34.0)
MCHC: 32.3 g/dL (ref 30.0–36.0)
MCV: 91.2 fL (ref 80.0–100.0)
Platelets: 232 10*3/uL (ref 150–400)
RBC: 3.74 MIL/uL — ABNORMAL LOW (ref 3.87–5.11)
RDW: 12.3 % (ref 11.5–15.5)
WBC: 6.9 10*3/uL (ref 4.0–10.5)
nRBC: 0 % (ref 0.0–0.2)

## 2020-05-14 LAB — BASIC METABOLIC PANEL
Anion gap: 10 (ref 5–15)
BUN: 27 mg/dL — ABNORMAL HIGH (ref 8–23)
CO2: 23 mmol/L (ref 22–32)
Calcium: 8.1 mg/dL — ABNORMAL LOW (ref 8.9–10.3)
Chloride: 107 mmol/L (ref 98–111)
Creatinine, Ser: 1.67 mg/dL — ABNORMAL HIGH (ref 0.44–1.00)
GFR, Estimated: 31 mL/min — ABNORMAL LOW (ref >60)
Glucose, Bld: 104 mg/dL — ABNORMAL HIGH (ref 70–99)
Potassium: 4.4 mmol/L (ref 3.5–5.1)
Sodium: 140 mmol/L (ref 135–145)

## 2020-05-14 LAB — HIV ANTIBODY (ROUTINE TESTING W REFLEX): HIV Screen 4th Generation wRfx: NONREACTIVE

## 2020-05-14 MED ORDER — ALUM & MAG HYDROXIDE-SIMETH 200-200-20 MG/5ML PO SUSP
30.0000 mL | Freq: Once | ORAL | Status: AC
  Administered 2020-05-14: 30 mL via ORAL
  Filled 2020-05-14: qty 30

## 2020-05-14 MED ORDER — SODIUM CHLORIDE 0.9 % IV BOLUS
1000.0000 mL | Freq: Once | INTRAVENOUS | Status: AC
  Administered 2020-05-14: 1000 mL via INTRAVENOUS

## 2020-05-14 MED ORDER — INFLUENZA VAC A&B SA ADJ QUAD 0.5 ML IM PRSY
0.5000 mL | PREFILLED_SYRINGE | INTRAMUSCULAR | Status: AC
  Administered 2020-05-17: 0.5 mL via INTRAMUSCULAR
  Filled 2020-05-14 (×2): qty 0.5

## 2020-05-14 MED ORDER — ENOXAPARIN SODIUM 60 MG/0.6ML ~~LOC~~ SOLN
0.5000 mg/kg | SUBCUTANEOUS | Status: DC
  Administered 2020-05-14 – 2020-05-16 (×3): 55 mg via SUBCUTANEOUS
  Filled 2020-05-14 (×4): qty 0.6

## 2020-05-14 MED ORDER — DICYCLOMINE HCL 10 MG/5ML PO SOLN
10.0000 mg | Freq: Once | ORAL | Status: DC
  Filled 2020-05-14: qty 5

## 2020-05-14 MED ORDER — GABAPENTIN 100 MG PO CAPS
100.0000 mg | ORAL_CAPSULE | Freq: Two times a day (BID) | ORAL | Status: DC | PRN
  Filled 2020-05-14: qty 1

## 2020-05-14 MED ORDER — ROPINIROLE HCL 0.25 MG PO TABS
0.5000 mg | ORAL_TABLET | Freq: Two times a day (BID) | ORAL | Status: DC | PRN
  Filled 2020-05-14: qty 4

## 2020-05-14 NOTE — Plan of Care (Signed)

## 2020-05-14 NOTE — Progress Notes (Signed)
PROGRESS NOTE    Whitney [name]  ZOX:[id number] DOB: [birth year] DOA: 05/13/2020 PCP: Ronnell Freshwater, NP    Chief Complaint  Patient presents with  . Chest Pain  . Abdominal Pain    Brief Narrative:   69 year old lady with prior h/o Stage 3 CKD, S/P LEFT SIDED nephrectomy, depression, hypertension, presents to ED with nausea, left sided abd pain.  CT abdomen showed Moderate uncomplicated sigmoid diverticulitis. Superimposed severesigmoid diverticulosis. Given marked circumferential focal thickening of the bowel and apparent shouldering, an underlying annular mass is difficult to exclude and correlation with recent screening colonoscopy is recommended.  She was started on IV zosyn and general surgery consulted.  LAST colonoscopy in 09/2019, was poor prep, revealed polyps and biopsy negative for dysplasia.    Assessment & Plan:   Principal Problem:   Acute diverticulitis Active Problems:   Obesity (BMI 35.0-39.9 without comorbidity)   Paroxysmal atrial fibrillation (HCC)   Obstructive sleep apnea  Acute Diverticulitis:  - started the patient on IV zosyn, continue the same.  - pt reports persistent abd pain, change to NPO for now. - continue with IV fluids for hydration.  - continue with IV zofran and pain control.  - general surgery consulted for recommendations.    Body mass index is 40.77 kg/m. Obesity:  Recommend outpatient follow up with PCP.    Stage 3 a CKD:  Creatinine stable at 1.6   PAF: Rate controlled, on Flecainide.  NSR.    Hyperlipidemia:  Resume lipitor.    Essential Hypertension:  - Bp parameters are optimal.       DVT prophylaxis: (Lovenox) Code Status: full code.  Family Communication: none at bedside Disposition:   Status is: Inpatient  Remains inpatient appropriate because:Ongoing diagnostic testing needed not appropriate for outpatient work up, IV treatments appropriate due to intensity of illness or inability to take PO and  Inpatient level of care appropriate due to severity of illness due to severe diverticulitis.    Dispo: The patient is from: Home              Anticipated d/c is to: Home              Anticipated d/c date is: 2 days              Patient currently is not medically stable to d/c.       Consultants:   General surgery   Procedures: none.   Antimicrobials:  Antibiotics Given (last 72 hours)    Date/Time Action Medication Dose Rate   05/13/20 1047 New Bag/Given   piperacillin-tazobactam (ZOSYN) IVPB 3.375 g 3.375 g 12.5 mL/hr   05/13/20 1938 New Bag/Given   piperacillin-tazobactam (ZOSYN) IVPB 3.375 g 3.375 g 12.5 mL/hr   05/14/20 0242 New Bag/Given   piperacillin-tazobactam (ZOSYN) IVPB 3.375 g 3.375 g 12.5 mL/hr       Subjective: Reported took pain meds for abd pain. Some nausea, no vomiting.   Objective: Vitals:   05/14/20 0818 05/14/20 0820 05/14/20 0824 05/14/20 1039  BP:   116/72   Pulse: 88 86 80   Resp:   20   Temp:    98.5 F (36.9 C)  TempSrc:    Oral  SpO2: 99% 100% 94%   Weight:      Height:        Intake/Output Summary (Last 24 hours) at 05/14/2020 1246 Last data filed at 05/14/2020 0612 Gross per 24 hour  Intake 1100 ml  Output 550  ml  Net 550 ml   Filed Weights   05/12/20 2339  Weight: 111.1 kg    Examination:  General exam: in mild distress from abdominal pain.  Respiratory system: Clear to auscultation. Respiratory effort normal. Cardiovascular system: S1 & S2 heard, RRR. No JVD,  No pedal edema. Gastrointestinal system: Abdomen is soft, generalized tenderness in the lower quadrant , more in the left compared to th right, bowel sounds heard and normal.  Central nervous system: Alert and oriented. No focal neurological deficits. Extremities: Symmetric 5 x 5 power. Skin: No rashes, lesions or ulcers Psychiatry:  Mood & affect appropriate.     Data Reviewed: I have personally reviewed following labs and imaging studies  CBC: Recent  Labs  Lab 05/12/20 2348 05/14/20 0632  WBC 9.1 6.9  HGB 12.8 11.0*  HCT 38.6 34.1*  MCV 89.4 91.2  PLT 293 563    Basic Metabolic Panel: Recent Labs  Lab 05/12/20 2348 05/14/20 0632  NA 138 140  K 4.2 4.4  CL 105 107  CO2 24 23  GLUCOSE 102* 104*  BUN 27* 27*  CREATININE 1.67* 1.67*  CALCIUM 8.6* 8.1*    GFR: Estimated Creatinine Clearance: 39.5 mL/min (A) (by C-G formula based on SCr of 1.67 mg/dL (H)).  Liver Function Tests: Recent Labs  Lab 05/13/20 0128  AST 13*  ALT 11  ALKPHOS 68  BILITOT 0.6  PROT 7.4  ALBUMIN 3.2*    CBG: Recent Labs  Lab 05/13/20 2145  GLUCAP 75     Recent Results (from the past 240 hour(s))  Respiratory Panel by RT PCR (Flu A&B, Covid) - Nasopharyngeal Swab     Status: None   Collection Time: 05/13/20 10:48 AM   Specimen: Nasopharyngeal Swab  Result Value Ref Range Status   SARS Coronavirus 2 by RT PCR NEGATIVE NEGATIVE Final    Comment: (NOTE) SARS-CoV-2 target nucleic acids are NOT DETECTED.  The SARS-CoV-2 RNA is generally detectable in upper respiratoy specimens during the acute phase of infection. The lowest concentration of SARS-CoV-2 viral copies this assay can detect is 131 copies/mL. A negative result does not preclude SARS-Cov-2 infection and should not be used as the sole basis for treatment or other patient management decisions. A negative result may occur with  improper specimen collection/handling, submission of specimen other than nasopharyngeal swab, presence of viral mutation(s) within the areas targeted by this assay, and inadequate number of viral copies (<131 copies/mL). A negative result must be combined with clinical observations, patient history, and epidemiological information. The expected result is Negative.  Fact Sheet for Patients:  PinkCheek.be  Fact Sheet for Healthcare Providers:  GravelBags.it  This test is no t yet approved  or cleared by the [place] FDA and  has been authorized for detection and/or diagnosis of SARS-CoV-2 by FDA under an Emergency Use Authorization (EUA). This EUA will remain  in effect (meaning this test can be used) for the duration of the COVID-19 declaration under Section 564(b)(1) of the Act, 21 U.S.C. section 360bbb-3(b)(1), unless the authorization is terminated or revoked sooner.     Influenza A by PCR NEGATIVE NEGATIVE Final   Influenza B by PCR NEGATIVE NEGATIVE Final    Comment: (NOTE) The Xpert Xpress SARS-CoV-2/FLU/RSV assay is intended as an aid in  the diagnosis of influenza from Nasopharyngeal swab specimens and  should not be used as a sole basis for treatment. Nasal washings and  aspirates are unacceptable for Xpert Xpress SARS-CoV-2/FLU/RSV  testing.  Fact Sheet  for Patients: PinkCheek.be  Fact Sheet for Healthcare Providers: GravelBags.it  This test is not yet approved or cleared by the [place] FDA and  has been authorized for detection and/or diagnosis of SARS-CoV-2 by  FDA under an Emergency Use Authorization (EUA). This EUA will remain  in effect (meaning this test can be used) for the duration of the  Covid-19 declaration under Section 564(b)(1) of the Act, 21  U.S.C. section 360bbb-3(b)(1), unless the authorization is  terminated or revoked. Performed at [facility], [address] [place], [place] [address]          Radiology Studies: DG Chest 2 View  Result Date: 05/13/2020 CLINICAL DATA:  Chest pain. EXAM: CHEST - 2 VIEW COMPARISON:  Dec 19, 2017 FINDINGS: The heart size and mediastinal contours are within normal limits. Both lungs are clear. The visualized skeletal structures are unremarkable. Moderate degenerative changes are noted of the left glenohumeral joint. IMPRESSION: No acute cardiopulmonary process. Electronically Signed   By: Constance Holster M.D.   On:  05/13/2020 00:38   CT Renal Stone Study  Result Date: 05/13/2020 CLINICAL DATA:  Left flank pain, acute renal insufficiency EXAM: CT ABDOMEN AND PELVIS WITHOUT CONTRAST TECHNIQUE: Multidetector CT imaging of the abdomen and pelvis was performed following the standard protocol without IV contrast. COMPARISON:  05/27/2019 FINDINGS: Lower chest: The visualized lung bases are clear bilaterally. Extensive coronary artery calcification. Global cardiac size is within normal limits. Tiny hiatal hernia. Hepatobiliary: Simple cyst noted within the right hepatic lobe. Cholecystectomy has been performed. No intra or extrahepatic biliary ductal dilation. Pancreas: Unremarkable Spleen: Unremarkable Adrenals/Urinary Tract: The adrenal glands are unremarkable. Left nephrectomy has been performed. The right kidney is normal in position and demonstrates mild compensatory hypertrophy. No intrarenal or ureteral calculi are identified. No hydronephrosis. The bladder is unremarkable. Stomach/Bowel: There is severe distal descending and sigmoid colonic diverticulosis. There are superimposed extensive pericolonic inflammatory changes noted involving the proximal sigmoid colon within the left lower quadrant of the abdomen in keeping with acute sigmoid diverticulitis. There is no free intraperitoneal gas. Trace free fluid within the left pericolic gutter. No loculated intra-abdominal fluid collections are seen. There is no evidence of obstruction. There is notable shouldering noted, best appreciated on coronal image # 90/5 and a annular colonic mass, while considered less likely, is not completely excluded on this examination. The stomach, small bowel, and large bowel are otherwise unremarkable. Appendix normal. Vascular/Lymphatic: Moderate aortoiliac atherosclerotic calcification is present. No aortic aneurysm. No pathologic adenopathy within the abdomen and pelvis. Reproductive: Uterus and bilateral adnexa are unremarkable. Other:  Rectum unremarkable. Musculoskeletal: Degenerative changes are seen throughout the lumbar spine. No lytic or blastic bone lesions are seen. IMPRESSION: Moderate uncomplicated sigmoid diverticulitis. Superimposed severe sigmoid diverticulosis. Given marked circumferential focal thickening of the bowel and apparent shouldering, an underlying annular mass is difficult to exclude and correlation with recent screening colonoscopy is recommended. Aortic Atherosclerosis (ICD10-I70.0). Electronically Signed   By: Fidela Salisbury MD   On: 05/13/2020 04:14        Scheduled Meds: . amLODipine  5 mg Oral BID  . atorvastatin  40 mg Oral QHS  . carvedilol  6.25 mg Oral BID WC  . dicyclomine  10 mg Oral Once  . enoxaparin (LOVENOX) injection  40 mg Subcutaneous Q24H  . hydrALAZINE  50 mg Oral TID  . pantoprazole  40 mg Oral Daily   Continuous Infusions: . sodium chloride 75 mL/hr at 05/13/20 1609  . piperacillin-tazobactam (ZOSYN)  IV 3.375 g (05/14/20 0242)     LOS: 1 day        Hosie Poisson, MD [facility]   To contact the attending provider between 7A-7P or the covering provider during after hours 7P-7A, please log into the web site [email or web address] and access using universal [place] password for that web site. If you do not have the password, please call the hospital operator.  05/14/2020, 12:46 PM

## 2020-05-14 NOTE — Progress Notes (Signed)
PHARMACIST - PHYSICIAN COMMUNICATION  CONCERNING:  Enoxaparin (Lovenox) for DVT Prophylaxis    RECOMMENDATION: Patient was prescribed enoxaprin 40mg  q24 hours for VTE prophylaxis.   Filed Weights   05/12/20 2339  Weight: 111.1 kg (245 lb)    Body mass index is 40.77 kg/m.  Estimated Creatinine Clearance: 39.5 mL/min (A) (by C-G formula based on SCr of 1.67 mg/dL (H)).   Based on [place] patient is candidate for enoxaparin 0.5mg /kg TBW SQ every 24 hours based on BMI being >30.  DESCRIPTION: Pharmacy has adjusted enoxaparin dose per [facility] policy.  Patient is now receiving enoxaparin 55 mg every 24 hours    Paulina Fusi, PharmD, BCPS 05/14/2020 4:06 PM

## 2020-05-14 NOTE — Consult Note (Signed)
[facility] SURGICAL CONSULTATION NOTE (initial) - cpt: [id number]   HISTORY OF PRESENT ILLNESS (HPI):  69 y.o. female presented to [facility] ED yesterday (10/10) for evaluation of abdominal pain. Patient reports that she initially started to feel some left lower quadrant abdominal pain around the 1st of the month. She contributed this to gas pains. This would intermittently resolve over the first few days. However, in the last few days this pain became more intense and constant in nature. She described this as a sharp pain. When the pain got worse she would notce associated nausea and emesis. She denied any fever, chills, cough, congestion, SP, SOB, urinary changes or bowel changes. She spoke to her PCP who ultimately referred her to the ED for further evaluation. No history of similar in the past. Her last colonoscopy was in 09/2019 which showed ascending colon polyps and poor prep. Biopsies at that time without evidence of cancer. Previous abdominal surgeries include cholecystectomy and ventral hernia repair in 2019 with Dr Dahlia Byes. Work up in the ED revealed no leukocytosis with WBC 6.9k, acute on chronic CKD with sCr - 1.67 (baseline appears 1.1 - 1.4), and CT Abdomen/pelvis was concerning for acute uncomplicated diverticulitis. She was admitted to medicine service and started on IV Zosyn.   Surgery is consulted by hospitliast physician Dr. Hosie Poisson, MD in this context for evaluation and management of acute uncomplicated diverticulitis.  PAST MEDICAL HISTORY (PMH):  Past Medical History:  Diagnosis Date  . Abnormal Pap smear of cervix    Pt states she had colposcopy   . Anxiety   . Arthritis   . Chronic kidney disease    removed left kidney  . Depression   . Dyspnea   . Dysrhythmia   . Hx of unilateral nephrectomy 1979   Left Nephrectomy  . Hypertension   . Lower extremity edema   . Palpitations   . PMB (postmenopausal bleeding)   . Restless leg syndrome   . Sleep apnea     has no cpap  . Vitamin D deficiency      PAST SURGICAL HISTORY ([place]):  Past Surgical History:  Procedure Laterality Date  . BIOPSY  10/31/2019   Procedure: BIOPSY;  Surgeon: Rush Landmark Telford Nab., MD;  Location: [place];  Service: Gastroenterology;;  . CARDIAC CATHETERIZATION    . CHOLECYSTECTOMY    . COLONOSCOPY WITH PROPOFOL N/A 06/13/2019   Procedure: COLONOSCOPY WITH PROPOFOL;  Surgeon: [name], MD;  Location: [facility] ENDOSCOPY;  Service: Gastroenterology;  Laterality: N/A;  . COLONOSCOPY WITH PROPOFOL N/A 08/12/2019   Procedure: COLONOSCOPY WITH PROPOFOL;  Surgeon: [name], MD;  Location: [facility] ENDOSCOPY;  Service: Gastroenterology;  Laterality: N/A;  . COLONOSCOPY WITH PROPOFOL N/A 09/19/2019   Procedure: COLONOSCOPY WITH PROPOFOL;  Surgeon: [name], MD;  Location: [facility] ENDOSCOPY;  Service: Gastroenterology;  Laterality: N/A;  . DILATION AND CURETTAGE OF UTERUS    . ESOPHAGOGASTRODUODENOSCOPY (EGD) WITH PROPOFOL N/A 06/13/2019   Procedure: ESOPHAGOGASTRODUODENOSCOPY (EGD) WITH PROPOFOL;  Surgeon: [name], MD;  Location: [facility] ENDOSCOPY;  Service: Gastroenterology;  Laterality: N/A;  Pt will go for COVID test on 50-2-77 as she uses public transportation and will be in the area on that day.   . ESOPHAGOGASTRODUODENOSCOPY (EGD) WITH PROPOFOL N/A 10/31/2019   Procedure: ESOPHAGOGASTRODUODENOSCOPY (EGD) WITH PROPOFOL;  Surgeon: Rush Landmark Telford Nab., MD;  Location: [place];  Service: Gastroenterology;  Laterality: N/A;  . HEMOSTASIS CLIP PLACEMENT  10/31/2019   Procedure: HEMOSTASIS CLIP PLACEMENT;  Surgeon: Irving Copas., MD;  Location: [place] ENDOSCOPY;  Service: Gastroenterology;;  . HYSTEROSCOPY WITH D & C N/A 02/16/2017   Procedure: DILATATION AND CURETTAGE /HYSTEROSCOPY;  Surgeon: Defrancesco, Alanda Slim, MD;  Location: [facility] ORS;  Service: Gynecology;  Laterality: N/A;  . JOINT REPLACEMENT    . kidney removal Left   . KIDNEY SURGERY Left 1979   left kidney  removed  . LEFT HEART CATH AND CORONARY ANGIOGRAPHY N/A 10/21/2017   Procedure: LEFT HEART CATH AND CORONARY ANGIOGRAPHY;  Surgeon: Teodoro Spray, MD;  Location: [place] [facility];  Service: Cardiovascular;  Laterality: N/A;  . POLYPECTOMY  10/31/2019   Procedure: POLYPECTOMY;  Surgeon: Rush Landmark Telford Nab., MD;  Location: [place];  Service: Gastroenterology;;  . TOTAL KNEE ARTHROPLASTY Left 07/06/2017   Procedure: TOTAL KNEE ARTHROPLASTY;  Surgeon: Lovell Sheehan, MD;  Location: [facility] ORS;  Service: Orthopedics;  Laterality: Left;  . UPPER ESOPHAGEAL ENDOSCOPIC ULTRASOUND (EUS) N/A 10/31/2019   Procedure: UPPER ESOPHAGEAL ENDOSCOPIC ULTRASOUND (EUS);  Surgeon: Irving Copas., MD;  Location: [place];  Service: Gastroenterology;  Laterality: N/A;  . WRIST ARTHROSCOPY Left 1970s  . XI ROBOTIC ASSISTED VENTRAL HERNIA N/A 12/01/2019   Procedure: XI ROBOTIC ASSISTED VENTRAL HERNIA;  Surgeon: Jules Husbands, MD;  Location: [facility] ORS;  Service: General;  Laterality: N/A;     MEDICATIONS:  Prior to Admission medications   Medication Sig Start Date End Date Taking? Authorizing Provider  amLODipine (NORVASC) 5 MG tablet Take 1 tablet (5 mg total) by mouth 2 (two) times daily. 01/25/20  Yes Boscia, Greer Ee, NP  atorvastatin (LIPITOR) 40 MG tablet Take 1 tablet (40 mg total) by mouth daily at 6 PM. Patient taking differently: Take 40 mg by mouth at bedtime.  09/29/19  Yes Scarboro, Audie Clear, NP  carvedilol (COREG) 3.125 MG tablet TAKE 2 TABLETS(6.25 MG) BY MOUTH TWICE DAILY WITH A MEAL Patient taking differently: Take 6.25 mg by mouth in the morning and at bedtime.  08/30/19  Yes Lavera Guise, MD  gabapentin (NEURONTIN) 100 MG capsule Take 100 mg by mouth 2 (two) times daily.   Yes [provider]  hydrALAZINE (APRESOLINE) 50 MG tablet Take 50 mg by mouth 3 (three) times daily. 01/19/20  Yes [provider]  omeprazole (PRILOSEC) 40 MG capsule Take 1 capsule (40 mg  total) by mouth 2 (two) times daily before a meal. For 1 month.  Then decrease to once daily dosing thereafter.  Please take before meals. 10/31/19 05/13/20 Yes Mansouraty, Telford Nab., MD  orlistat (ALLI) 60 MG capsule Take 60 mg by mouth 3 (three) times daily with meals.   Yes [provider]  rOPINIRole (REQUIP) 0.5 MG tablet Take 0.5-1 mg by mouth 2 (two) times daily as needed (pain).   Yes [provider]  aspirin EC 81 MG tablet Take 81 mg by mouth daily. Patient not taking: Reported on 05/13/2020    [provider]  Cholecalciferol (VITAMIN D) 50 MCG (2000 UT) tablet Take 2,000 Units by mouth in the morning and at bedtime.  Patient not taking: Reported on 05/13/2020    [provider]  diclofenac Sodium (VOLTAREN) 1 % GEL Apply 1 application topically 4 (four) times daily as needed (pain).     [provider]  flecainide (TAMBOCOR) 100 MG tablet Take 100 mg by mouth 2 (two) times daily as needed (palpitations).     [provider]  OVER THE COUNTER MEDICATION Apply 1 application topically in the morning and at bedtime. Tummy  cream    [provider]  traMADol Veatrice Bourbon) 50 MG tablet Take one tab po bid for pain 05/10/20   Lavera Guise, MD  traZODone (DESYREL) 50 MG tablet Take 0.5 tablets (25 mg total) by mouth at bedtime as needed for sleep. Patient not taking: Reported on 05/13/2020 03/06/20   Lavera Guise, MD     ALLERGIES:  Allergies  Allergen Reactions  . Enalapril Swelling  . Lisinopril Swelling     SOCIAL HISTORY:  Social History   Socioeconomic History  . Marital status: Divorced    Spouse name: Not on file  . Number of children: Not on file  . Years of education: Not on file  . Highest education level: Not on file  Occupational History  . Not on file  Tobacco Use  . Smoking status: Never Smoker  . Smokeless tobacco: Never Used  Vaping Use  . Vaping Use: Never used  Substance and Sexual Activity  .  Alcohol use: Yes    Alcohol/week: 2.0 standard drinks    Types: 2 Standard drinks or equivalent per week    Comment: occasionally  . Drug use: Yes    Frequency: 3.0 times per week    Types: Marijuana, Cocaine    Comment: Pt states she uses for leg pain: marijuana; states no longer uses cocaine  . Sexual activity: Yes    Birth control/protection: None  Other Topics Concern  . Not on file  Social History Narrative  . Not on file   Social Determinants of Health   Financial Resource Strain:   . Difficulty of Paying Living Expenses: Not on file  Food Insecurity:   . Worried About Charity fundraiser in the Last Year: Not on file  . Ran Out of Food in the Last Year: Not on file  Transportation Needs:   . Lack of Transportation (Medical): Not on file  . Lack of Transportation (Non-Medical): Not on file  Physical Activity:   . Days of Exercise per Week: Not on file  . Minutes of Exercise per Session: Not on file  Stress:   . Feeling of Stress : Not on file  Social Connections:   . Frequency of Communication with Friends and Family: Not on file  . Frequency of Social Gatherings with Friends and Family: Not on file  . Attends Religious Services: Not on file  . Active Member of Clubs or Organizations: Not on file  . Attends Archivist Meetings: Not on file  . Marital Status: Not on file  Intimate Partner Violence:   . Fear of Current or Ex-Partner: Not on file  . Emotionally Abused: Not on file  . Physically Abused: Not on file  . Sexually Abused: Not on file     FAMILY HISTORY:  Family History  Problem Relation Age of Onset  . Ovarian cancer Mother   . Hypertension Father   . Diabetes Father   . Cancer Father   . Breast cancer Sister   . Diabetes Sister       REVIEW OF SYSTEMS:  Review of Systems  Constitutional: Negative for chills and fever.  HENT: Negative for congestion and sore throat.   Respiratory: Negative for cough and shortness of breath.    Cardiovascular: Negative for chest pain and palpitations.  Gastrointestinal: Positive for abdominal pain, nausea and vomiting. Negative for blood in stool, constipation and diarrhea.  Genitourinary: Negative for dysuria and urgency.  All other systems reviewed and are negative.  VITAL SIGNS:  Temp:  [98.2 F (36.8 C)-98.5 F (36.9 C)] 98.2 F (36.8 C) (10/11 0547) Pulse Rate:  [69-105] 80 (10/11 0824) Resp:  [20] 20 (10/11 0547) BP: (99-145)/(62-100) 116/72 (10/11 0824) SpO2:  [84 %-100 %] 94 % (10/11 0824)     Height: 5\' 5"  (165.1 cm) Weight: 111.1 kg BMI (Calculated): 40.77   INTAKE/OUTPUT:  10/10 0701 - 10/11 0700 In: 1100 [IV Piggyback:1100] Out: 550 [Urine:550]  PHYSICAL EXAM:  Physical Exam Vitals and nursing note reviewed.  Constitutional:      Appearance: She is well-developed. She is obese. She is not ill-appearing.  HENT:     Head: Normocephalic and atraumatic.  Eyes:     General: No scleral icterus.    Extraocular Movements: Extraocular movements intact.  Cardiovascular:     Rate and Rhythm: Normal rate and regular rhythm.     Heart sounds: Normal heart sounds. No murmur heard.   Pulmonary:     Effort: Pulmonary effort is normal. No respiratory distress.     Breath sounds: Normal breath sounds.  Abdominal:     General: A surgical scar is present. There is no distension.     Palpations: Abdomen is soft.     Tenderness: There is abdominal tenderness in the suprapubic area and left lower quadrant. There is no guarding or rebound.     Comments: Abdomen is soft, tenderness primarily in the LLQ, non-distended, no rebound/guarding, previous laparoscopic incisions seen.   Genitourinary:    Comments: Deferred Skin:    General: Skin is warm and dry.     Coloration: Skin is not jaundiced or pale.  Neurological:     General: No focal deficit present.     Mental Status: She is alert and oriented to person, place, and time.      Labs:  CBC Latest Ref Rng &  Units 05/14/2020 05/12/2020 11/18/2019  WBC 4.0 - 10.5 K/uL 6.9 9.1 6.1  Hemoglobin 12.0 - 15.0 g/dL 11.0(L) 12.8 13.2  Hematocrit 36 - 46 % 34.1(L) 38.6 39.9  Platelets 150 - 400 K/uL 232 293 317   CMP Latest Ref Rng & Units 05/14/2020 05/13/2020 05/12/2020  Glucose 70 - 99 mg/dL 104(H) - 102(H)  BUN 8 - 23 mg/dL 27(H) - 27(H)  Creatinine 0.44 - 1.00 mg/dL 1.67(H) - 1.67(H)  Sodium 135 - 145 mmol/L 140 - 138  Potassium 3.5 - 5.1 mmol/L 4.4 - 4.2  Chloride 98 - 111 mmol/L 107 - 105  CO2 22 - 32 mmol/L 23 - 24  Calcium 8.9 - 10.3 mg/dL 8.1(L) - 8.6(L)  Total Protein 6.5 - 8.1 g/dL - 7.4 -  Total Bilirubin 0.3 - 1.2 mg/dL - 0.6 -  Alkaline Phos 38 - 126 U/L - 68 -  AST 15 - 41 U/L - 13(L) -  ALT 0 - 44 U/L - 11 -     Imaging studies:   CT Abdomen/Pelvis (05/13/2020) personally reviewed showing thickening vs inflammation of the sigmoid colon which most likely represents diverticulitis without abscess or perforation, underlying masses not excluded, and radiologist report reviewed: IMPRESSION: Moderate uncomplicated sigmoid diverticulitis. Superimposed severe sigmoid diverticulosis. Given marked circumferential focal thickening of the bowel and apparent shouldering, an underlying annular mass is difficult to exclude and correlation with recent screening colonoscopy is recommended.  Assessment/Plan: (ICD-10's: K42.23) 69 y.o. female with abdominal pain found to have acute uncomplicated diverticulitis.   - Appreciate medicine admission  - Recommend NPO + IVF resuscitation  - Continue IV ABx (Zosyn)  -  Monitor abdominal examination; on-going bowel function  - Pain control prn; antiemetics prn  - Monitor for leukocytosis/fevers   - No need for emergent surgical intervention   - Mobilization   - Further management per primary service  All of the above findings and recommendations were discussed with the patient and all of patient's questions were answered to her expressed  satisfaction.  Thank you for the opportunity to participate in this patient's care.   -- Edison Simon, PA-C [facility] 05/14/2020, 10:16 AM [phone] M-F: 7am - 4pm

## 2020-05-14 NOTE — ED Notes (Signed)
Attempted to call report

## 2020-05-14 NOTE — ED Notes (Signed)
Attempted to call report again. Number left with secretary for call back for report.

## 2020-05-14 NOTE — Evaluation (Signed)
Physical Therapy Evaluation Patient Details Name: Whitney [name] MRN: [id number] DOB: Mar 10, 1951 Today's Date: 05/14/2020   History of Present Illness  Pt is a 69 y.o. female presenting to hospital 10/9 with chest pain, L sided abdominal pain, and pain across back.  Pt admitted with acute sigmoid diverticulitis, paroxysmal a-fib, htn, and dehydration.  PMH includes L-sided nephrectomy, depression, CKD, htn, RLS, sleep apnea, B hearing loss, symptomatic bradycardia, and h/o L TKA (pt reports plan to schedule R TKA).  Clinical Impression  Prior to hospital admission, pt was modified independent ambulating with [facility]; lives with her cousin in 1 level apt (no steps to enter).  Currently pt is modified independent with bed mobility; modified independent with transfers; and CGA with ambulating 8 feet with SPC.  Pt with antalgic gait d/t R knee pain (pt reports going to OP PT for R knee issues and plans for R TKR at some point).  Further ambulation deferred d/t pt limited by IV line (IV pole attached to bed and not moveable).  Anticipate pt may benefit from RW use for longer ambulation distances.  6/10 L abdominal pain during session (pt reports recent pain medication).  Pt would benefit from skilled PT to address noted impairments and functional limitations (see below for any additional details).  Upon hospital discharge, pt would benefit from continued OP PT services.    Follow Up Recommendations Outpatient PT (continue OP PT services)    Equipment Recommendations  Rolling walker with 5" wheels;Cane (pt has RW and SPC at home already)    Recommendations for Other Services       Precautions / Restrictions Precautions Precautions: Fall Precaution Comments: NPO Restrictions Weight Bearing Restrictions: No      Mobility  Bed Mobility Overal bed mobility: Modified Independent             General bed mobility comments: Semi-supine to/from sitting without any noted  difficulties  Transfers Overall transfer level: Modified independent Equipment used: None             General transfer comment: fairly strong stand noted from ED stretcher bed  Ambulation/Gait Ambulation/Gait assistance: Min guard Gait Distance (Feet): 8 Feet Assistive device: Straight cane Gait Pattern/deviations: Antalgic Gait velocity: decreased   General Gait Details: decreased stance time R LE (d/t R knee pain)  Stairs            Wheelchair Mobility    Modified Rankin (Stroke Patients Only)       Balance Overall balance assessment: Needs assistance Sitting-balance support: No upper extremity supported;Feet supported Sitting balance-Leahy Scale: Normal Sitting balance - Comments: steady sitting reaching outside BOS   Standing balance support: No upper extremity supported Standing balance-Leahy Scale: Good Standing balance comment: steady standing reaching within BOS                             Pertinent Vitals/Pain Pain Assessment: 0-10 Pain Score: 6  Pain Descriptors / Indicators: Constant;Discomfort;Grimacing;Aching Pain Intervention(s): Limited activity within patient's tolerance;Monitored during session;Premedicated before session;Repositioned  Vitals (HR and O2 on room air) stable and WFL throughout treatment session.    Home Living Family/patient expects to be discharged to:: Private residence Living Arrangements: Other relatives ([place]) Available Help at Discharge: Family Type of Home: Apartment Home Access: Level entry     Home Layout: One level [place]: [place] - single point;Shower seat;Toilet riser;Walker - 2 wheels      Prior Function Level of Independence: Independent  with assistive device(s)         Comments: Modified independent ambulating with SPC.  Has been going to OP PT d/t R knee issues.     Hand Dominance        Extremity/Trunk Assessment   Upper Extremity Assessment Upper Extremity Assessment:  Overall WFL for tasks assessed    Lower Extremity Assessment Lower Extremity Assessment: RLE deficits/detail (L LE WFL (s/p L TKR)) RLE Deficits / Details: at least 3/5 AROM hip flexion, knee flexion/extension, and DF/PF; decreased AROM knee flexion compared to L LE    Cervical / Trunk Assessment Cervical / Trunk Assessment: Normal  Communication   Communication: No difficulties  Cognition Arousal/Alertness: Awake/alert Behavior During Therapy: WFL for tasks assessed/performed Overall Cognitive Status: Within Functional Limits for tasks assessed                                        General Comments   Nursing cleared pt for participation in physical therapy.  Pt agreeable to PT session.    Exercises     Assessment/Plan    PT Assessment Patient needs continued PT services  PT Problem List Decreased strength;Decreased range of motion;Decreased activity tolerance;Decreased balance;Decreased mobility;Pain       PT Treatment Interventions DME instruction;Gait training;Functional mobility training;Therapeutic activities;Therapeutic exercise;Balance training;Patient/family education    PT Goals (Current goals can be found in the Care Plan section)  Acute Rehab PT Goals Patient Stated Goal: to improve pain PT Goal Formulation: With patient Time For Goal Achievement: 05/28/20 Potential to Achieve Goals: Fair    Frequency Min 2X/week   Barriers to discharge        Co-evaluation               AM-PAC PT "6 Clicks" Mobility  Outcome Measure Help needed turning from your back to your side while in a flat bed without using bedrails?: None Help needed moving from lying on your back to sitting on the side of a flat bed without using bedrails?: None Help needed moving to and from a bed to a chair (including a wheelchair)?: None Help needed standing up from a chair using your arms (e.g., wheelchair or bedside chair)?: None Help needed to walk in hospital  room?: A Little Help needed climbing 3-5 steps with a railing? : A Little 6 Click Score: 22    End of Session Equipment Utilized During Treatment: Gait belt Activity Tolerance: Patient tolerated treatment well Patient left: in bed;with call bell/phone within reach (ED stretcher bed) Nurse Communication: Mobility status;Precautions PT Visit Diagnosis: Other abnormalities of gait and mobility (R26.89);Pain;Muscle weakness (generalized) (M62.81) Pain - Right/Left: Right Pain - part of body: Knee    Time: 4827-0786 PT Time Calculation (min) (ACUTE ONLY): 22 min   Charges:   PT Evaluation $PT Eval Low Complexity: 1 Low         Azeez Dunker, PT 05/14/20, 3:55 PM

## 2020-05-14 NOTE — Consult Note (Signed)
[name], MD [address] [place], [place], [place], [address] [address], [place], [place], [place], [address] Phone: [phone]  Fax: [phone]  Consultation  Referring Provider:     Dr. Karleen Hampshire Primary Care Physician:  Ronnell Freshwater, NP Reason for Consultation:     Diverticulitis  Date of Admission:  05/13/2020 Date of Consultation:  05/14/2020         HPI:   Whitney [name] is a 69 y.o. female presents with 1 week history of abdominal pain, that worsened over the last 2 to 3 days.  Left lower quadrant, sharp, 10/10, decreased to 7/10 today after pain medications.  Not associated with diarrhea.  Did have some nausea with it.  Patient has had recent colonoscopy, last in February 2021, with Dr. Vicente Males.  Extensive exam was cecum.  Colon prep was described to be poor.  Subcentimeter polyps were removed.  Stool in the ascending colon is reported.  Repeat exam was recommended in5 months  Past Medical History:  Diagnosis Date  . Abnormal Pap smear of cervix    Pt states she had colposcopy   . Anxiety   . Arthritis   . Chronic kidney disease    removed left kidney  . Depression   . Dyspnea   . Dysrhythmia   . Hx of unilateral nephrectomy 1979   Left Nephrectomy  . Hypertension   . Lower extremity edema   . Palpitations   . PMB (postmenopausal bleeding)   . Restless leg syndrome   . Sleep apnea    has no cpap  . Vitamin D deficiency     Past Surgical History:  Procedure Laterality Date  . BIOPSY  10/31/2019   Procedure: BIOPSY;  Surgeon: Rush Landmark Telford Nab., MD;  Location: [facility];  Service: Gastroenterology;;  . CARDIAC CATHETERIZATION    . CHOLECYSTECTOMY    . COLONOSCOPY WITH PROPOFOL N/A 06/13/2019   Procedure: COLONOSCOPY WITH PROPOFOL;  Surgeon: [name], MD;  Location: [facility] ENDOSCOPY;  Service: Gastroenterology;  Laterality: N/A;  . COLONOSCOPY WITH PROPOFOL N/A 08/12/2019   Procedure: COLONOSCOPY WITH PROPOFOL;  Surgeon: [name], MD;  Location: [facility] ENDOSCOPY;  Service: Gastroenterology;  Laterality: N/A;  . COLONOSCOPY WITH PROPOFOL N/A 09/19/2019   Procedure: COLONOSCOPY WITH PROPOFOL;  Surgeon: [name], MD;  Location: [facility] ENDOSCOPY;  Service: Gastroenterology;  Laterality: N/A;  . DILATION AND CURETTAGE OF UTERUS    . ESOPHAGOGASTRODUODENOSCOPY (EGD) WITH PROPOFOL N/A 06/13/2019   Procedure: ESOPHAGOGASTRODUODENOSCOPY (EGD) WITH PROPOFOL;  Surgeon: [name], MD;  Location: [facility] ENDOSCOPY;  Service: Gastroenterology;  Laterality: N/A;  Pt will go for COVID test on 56-4-33 as she uses public transportation and will be in the area on that day.   . ESOPHAGOGASTRODUODENOSCOPY (EGD) WITH PROPOFOL N/A 10/31/2019   Procedure: ESOPHAGOGASTRODUODENOSCOPY (EGD) WITH PROPOFOL;  Surgeon: Rush Landmark Telford Nab., MD;  Location: [place];  Service: Gastroenterology;  Laterality: N/A;  . HEMOSTASIS CLIP PLACEMENT  10/31/2019   Procedure: HEMOSTASIS CLIP PLACEMENT;  Surgeon: Irving Copas., MD;  Location: [place];  Service: Gastroenterology;;  . HYSTEROSCOPY WITH D & C N/A 02/16/2017   Procedure: DILATATION AND CURETTAGE [place];  Surgeon: Brayton Mars, MD;  Location: [facility] ORS;  Service: Gynecology;  Laterality: N/A;  . JOINT REPLACEMENT    . kidney removal Left   . KIDNEY SURGERY Left 1979   left kidney removed  . LEFT HEART CATH AND CORONARY ANGIOGRAPHY N/A 10/21/2017   Procedure: LEFT HEART CATH AND CORONARY  ANGIOGRAPHY;  Surgeon: Teodoro Spray, MD;  Location: [facility];  Service: Cardiovascular;  Laterality: N/A;  . POLYPECTOMY  10/31/2019   Procedure: POLYPECTOMY;  Surgeon: Rush Landmark Telford Nab., MD;  Location: [place];  Service: Gastroenterology;;  . TOTAL KNEE ARTHROPLASTY Left 07/06/2017   Procedure: TOTAL KNEE ARTHROPLASTY;  Surgeon: Lovell Sheehan, MD;  Location: [facility];  Service: Orthopedics;  Laterality: Left;  . UPPER ESOPHAGEAL ENDOSCOPIC ULTRASOUND (EUS)  N/A 10/31/2019   Procedure: UPPER ESOPHAGEAL ENDOSCOPIC ULTRASOUND (EUS);  Surgeon: Irving Copas., MD;  Location: [place];  Service: Gastroenterology;  Laterality: N/A;  . WRIST ARTHROSCOPY Left 1970s  . XI ROBOTIC ASSISTED VENTRAL HERNIA N/A 12/01/2019   Procedure: XI ROBOTIC ASSISTED VENTRAL HERNIA;  Surgeon: Jules Husbands, MD;  Location: [facility] ORS;  Service: General;  Laterality: N/A;    Prior to Admission medications   Medication Sig Start Date End Date Taking? Authorizing Provider  amLODipine (NORVASC) 5 MG tablet Take 1 tablet (5 mg total) by mouth 2 (two) times daily. 01/25/20  Yes Boscia, Greer Ee, NP  atorvastatin (LIPITOR) 40 MG tablet Take 1 tablet (40 mg total) by mouth daily at 6 PM. Patient taking differently: Take 40 mg by mouth at bedtime.  09/29/19  Yes Scarboro, Audie Clear, NP  carvedilol (COREG) 3.125 MG tablet TAKE 2 TABLETS(6.25 MG) BY MOUTH TWICE DAILY WITH A MEAL Patient taking differently: Take 6.25 mg by mouth in the morning and at bedtime.  08/30/19  Yes Lavera Guise, MD  gabapentin (NEURONTIN) 100 MG capsule Take 100 mg by mouth 2 (two) times daily.   Yes [provider]  hydrALAZINE (APRESOLINE) 50 MG tablet Take 50 mg by mouth 3 (three) times daily. 01/19/20  Yes [provider]  omeprazole (PRILOSEC) 40 MG capsule Take 1 capsule (40 mg total) by mouth 2 (two) times daily before a meal. For 1 month.  Then decrease to once daily dosing thereafter.  Please take before meals. 10/31/19 05/13/20 Yes Mansouraty, Telford Nab., MD  orlistat (ALLI) 60 MG capsule Take 60 mg by mouth 3 (three) times daily with meals.   Yes [provider]  rOPINIRole (REQUIP) 0.5 MG tablet Take 0.5-1 mg by mouth 2 (two) times daily as needed (pain).   Yes [provider]  aspirin EC 81 MG tablet Take 81 mg by mouth daily. Patient not taking: Reported on 05/13/2020    [provider]  Cholecalciferol (VITAMIN D) 50 MCG (2000 UT) tablet Take 2,000  Units by mouth in the morning and at bedtime.  Patient not taking: Reported on 05/13/2020    [provider]  diclofenac Sodium (VOLTAREN) 1 % GEL Apply 1 application topically 4 (four) times daily as needed (pain).     [provider]  flecainide (TAMBOCOR) 100 MG tablet Take 100 mg by mouth 2 (two) times daily as needed (palpitations).     [provider]  OVER THE COUNTER MEDICATION Apply 1 application topically in the morning and at bedtime. Tummy cream    [provider]  traMADol (ULTRAM) 50 MG tablet Take one tab po bid for pain 05/10/20   Lavera Guise, MD  traZODone (DESYREL) 50 MG tablet Take 0.5 tablets (25 mg total) by mouth at bedtime as needed for sleep. Patient not taking: Reported on 05/13/2020 03/06/20   Lavera Guise, MD    Family History  Problem Relation Age of Onset  . Ovarian cancer Mother   . Hypertension Father   .  Diabetes Father   . Cancer Father   . Breast cancer Sister   . Diabetes Sister      Social History   Tobacco Use  . Smoking status: Never Smoker  . Smokeless tobacco: Never Used  Vaping Use  . Vaping Use: Never used  Substance Use Topics  . Alcohol use: Yes    Alcohol/week: 2.0 standard drinks    Types: 2 Standard drinks or equivalent per week    Comment: occasionally  . Drug use: Yes    Frequency: 3.0 times per week    Types: Marijuana, Cocaine    Comment: Pt states she uses for leg pain: marijuana; states no longer uses cocaine    Allergies as of 05/12/2020 - Review Complete 05/12/2020  Allergen Reaction Noted  . Enalapril Swelling   . Lisinopril Swelling 05/26/2016    Review of Systems:    All systems reviewed and negative except where noted in HPI.   Physical Exam:  Vital signs in last 24 hours: Vitals:   05/14/20 0818 05/14/20 0820 05/14/20 0824 05/14/20 1039  BP:   116/72   Pulse: 88 86 80   Resp:   20   Temp:    98.5 F (36.9 C)  TempSrc:    Oral  SpO2: 99% 100% 94%   Weight:        Height:         General:   Pleasant, cooperative in NAD Head:  Normocephalic and atraumatic. Eyes:   No icterus.   Conjunctiva pink. PERRLA. Ears:  Normal auditory acuity. Neck:  Supple; no masses or thyroidomegaly Lungs: Respirations even and unlabored. Lungs clear to auscultation bilaterally.   No wheezes, crackles, or rhonchi.  Abdomen:  Soft, nondistended, nontender. Normal bowel sounds. No appreciable masses or hepatomegaly.  No rebound or guarding.  Neurologic:  Alert and oriented x3;  grossly normal neurologically. Skin:  Intact without significant lesions or rashes. Cervical Nodes:  No significant cervical adenopathy. Psych:  Alert and cooperative. Normal affect.  LAB RESULTS: Recent Labs    05/12/20 2348 05/14/20 0632  WBC 9.1 6.9  HGB 12.8 11.0*  HCT 38.6 34.1*  PLT 293 232   BMET Recent Labs    05/12/20 2348 05/14/20 0632  NA 138 140  K 4.2 4.4  CL 105 107  CO2 24 23  GLUCOSE 102* 104*  BUN 27* 27*  CREATININE 1.67* 1.67*  CALCIUM 8.6* 8.1*   LFT Recent Labs    05/13/20 0128  PROT 7.4  ALBUMIN 3.2*  AST 13*  ALT 11  ALKPHOS 68  BILITOT 0.6  BILIDIR <0.1  IBILI NOT CALCULATED   PT/INR No results for input(s): LABPROT, INR in the last 72 hours.  STUDIES: DG Chest 2 View  Result Date: 05/13/2020 CLINICAL DATA:  Chest pain. EXAM: CHEST - 2 VIEW COMPARISON:  Dec 19, 2017 FINDINGS: The heart size and mediastinal contours are within normal limits. Both lungs are clear. The visualized skeletal structures are unremarkable. Moderate degenerative changes are noted of the left glenohumeral joint. IMPRESSION: No acute cardiopulmonary process. Electronically Signed   By: Constance Holster M.D.   On: 05/13/2020 00:38   CT Renal Stone Study  Result Date: 05/13/2020 CLINICAL DATA:  Left flank pain, acute renal insufficiency EXAM: CT ABDOMEN AND PELVIS WITHOUT CONTRAST TECHNIQUE: Multidetector CT imaging of the abdomen and pelvis was performed following the  standard protocol without IV contrast. COMPARISON:  05/27/2019 FINDINGS: Lower chest: The visualized lung bases are clear bilaterally. Extensive coronary  artery calcification. Global cardiac size is within normal limits. Tiny hiatal hernia. Hepatobiliary: Simple cyst noted within the right hepatic lobe. Cholecystectomy has been performed. No intra or extrahepatic biliary ductal dilation. Pancreas: Unremarkable Spleen: Unremarkable Adrenals/Urinary Tract: The adrenal glands are unremarkable. Left nephrectomy has been performed. The right kidney is normal in position and demonstrates mild compensatory hypertrophy. No intrarenal or ureteral calculi are identified. No hydronephrosis. The bladder is unremarkable. Stomach/Bowel: There is severe distal descending and sigmoid colonic diverticulosis. There are superimposed extensive pericolonic inflammatory changes noted involving the proximal sigmoid colon within the left lower quadrant of the abdomen in keeping with acute sigmoid diverticulitis. There is no free intraperitoneal gas. Trace free fluid within the left pericolic gutter. No loculated intra-abdominal fluid collections are seen. There is no evidence of obstruction. There is notable shouldering noted, best appreciated on coronal image # 90/5 and a annular colonic mass, while considered less likely, is not completely excluded on this examination. The stomach, small bowel, and large bowel are otherwise unremarkable. Appendix normal. Vascular/Lymphatic: Moderate aortoiliac atherosclerotic calcification is present. No aortic aneurysm. No pathologic adenopathy within the abdomen and pelvis. Reproductive: Uterus and bilateral adnexa are unremarkable. Other: Rectum unremarkable. Musculoskeletal: Degenerative changes are seen throughout the lumbar spine. No lytic or blastic bone lesions are seen. IMPRESSION: Moderate uncomplicated sigmoid diverticulitis. Superimposed severe sigmoid diverticulosis. Given marked  circumferential focal thickening of the bowel and apparent shouldering, an underlying annular mass is difficult to exclude and correlation with recent screening colonoscopy is recommended. Aortic Atherosclerosis (ICD10-I70.0). Electronically Signed   By: Fidela Salisbury MD   On: 05/13/2020 04:14      Impression / Plan:   Whitney [name] is a 69 y.o. y/o female with diverticulitis, initial episode  CT reports uncomplicated sigmoid diverticulitis.  However, there is question of "marked circumferential focal thickening of the bowel and apparent shouldering, and underlying annular mass is difficult to exclude".  However, patient has had recent colonoscopy with evaluation of this area, therefore risk of malignancy is low  In addition, repeat colonoscopy at this time in the setting of acute diverticulitis, would have increased risks of perforation  Would recommend repeat colonoscopy in 6 to 8 weeks after this acute episode of diverticulitis, in the best interest of patient safety and less complications  Repeat colonoscopy was already planned for November with Dr. Vicente Males, and should be rescheduled for 6 to 8 weeks out from this admission  Continue management for diverticulitis with gram-negative coverage antibiotics, and patient should receive therapy for the same as an outpatient as well  Follow-up with Dr. Vicente Males in 3 to 4 weeks after discharge and colonoscopy can be rescheduled at that time    Thank you for involving me in the care of this patient.      LOS: 1 day   Virgel Manifold, MD  05/14/2020, 1:50 PM

## 2020-05-15 DIAGNOSIS — K5792 Diverticulitis of intestine, part unspecified, without perforation or abscess without bleeding: Secondary | ICD-10-CM | POA: Diagnosis not present

## 2020-05-15 LAB — BASIC METABOLIC PANEL
Anion gap: 9 (ref 5–15)
BUN: 19 mg/dL (ref 8–23)
CO2: 24 mmol/L (ref 22–32)
Calcium: 8.3 mg/dL — ABNORMAL LOW (ref 8.9–10.3)
Chloride: 109 mmol/L (ref 98–111)
Creatinine, Ser: 1.47 mg/dL — ABNORMAL HIGH (ref 0.44–1.00)
GFR, Estimated: 36 mL/min — ABNORMAL LOW (ref >60)
Glucose, Bld: 114 mg/dL — ABNORMAL HIGH (ref 70–99)
Potassium: 4.2 mmol/L (ref 3.5–5.1)
Sodium: 142 mmol/L (ref 135–145)

## 2020-05-15 MED ORDER — OXYCODONE-ACETAMINOPHEN 5-325 MG PO TABS
1.0000 | ORAL_TABLET | ORAL | Status: DC | PRN
  Administered 2020-05-16: 1 via ORAL
  Filled 2020-05-15: qty 1

## 2020-05-15 MED ORDER — HYDROMORPHONE HCL 1 MG/ML IJ SOLN
1.0000 mg | INTRAMUSCULAR | Status: DC | PRN
  Administered 2020-05-15 – 2020-05-16 (×2): 1 mg via INTRAVENOUS
  Filled 2020-05-15 (×2): qty 1

## 2020-05-15 MED ORDER — ALUM & MAG HYDROXIDE-SIMETH 200-200-20 MG/5ML PO SUSP
30.0000 mL | ORAL | Status: DC | PRN
  Administered 2020-05-15 – 2020-05-16 (×4): 30 mL via ORAL
  Filled 2020-05-15 (×4): qty 30

## 2020-05-15 MED ORDER — PANTOPRAZOLE SODIUM 40 MG IV SOLR
40.0000 mg | INTRAVENOUS | Status: DC
  Administered 2020-05-15 – 2020-05-17 (×3): 40 mg via INTRAVENOUS
  Filled 2020-05-15 (×3): qty 40

## 2020-05-15 MED ORDER — HYDRALAZINE HCL 20 MG/ML IJ SOLN: 5.0000 mg | Freq: Four times a day (QID) | INTRAMUSCULAR | Status: DC | PRN

## 2020-05-15 NOTE — Progress Notes (Addendum)
[facility] SURGICAL PROGRESS NOTE (cpt [id number])  Hospital Day(s): 2.   Interval History: Patient seen and examined, no acute events or new complaints overnight. Patient reports she is feeling better aside from an episode of burping/hiccups that exacerbated her LLQ. Otherwise, this is improved from yesterday. She denied fever, chills, nausea, emesis. No new labs or imaging this morning. Good UO recorded. She has continued to be NPO. She is working with PT here.   Review of Systems:  Constitutional: denies fever, chills  HEENT: denies cough or congestion  Respiratory: denies any shortness of breath  Cardiovascular: denies chest pain or palpitations  Gastrointestinal: + abdominal pain (improved), denied N/V, or diarrhea/and bowel function as per interval history Genitourinary: denies burning with urination or urinary frequency Musculoskeletal: denies pain, decreased motor or sensation  Vital signs in last 24 hours: [min-max] current  Temp:  [97.7 F (36.5 C)-98.5 F (36.9 C)] 97.7 F (36.5 C) (10/12 0458) Pulse Rate:  [72-105] 73 (10/12 0458) Resp:  [15-20] 15 (10/12 0458) BP: (103-139)/(60-73) 118/65 (10/12 0458) SpO2:  [84 %-100 %] 96 % (10/12 0458)     Height: 5\' 5"  (165.1 cm) Weight: 111.1 kg BMI (Calculated): 40.77   Intake/Output last 2 shifts:  10/11 0701 - 10/12 0700 In: 1794.2 [I.V.:1571.4; IV Piggyback:222.9] Out: 500 [Urine:500]   Physical Exam:  Constitutional: alert, cooperative and no distress  HENT: normocephalic without obvious abnormality  Eyes: PERRL, EOM's grossly intact and symmetric  Respiratory: breathing non-labored at rest  Cardiovascular: regular rate and sinus rhythm  Gastrointestinal: Soft, LLQ soreness improved, and non-distended, no rebound/guarding Musculoskeletal: no edema or wounds, motor and sensation grossly intact, NT    Labs:  CBC Latest Ref Rng & Units 05/14/2020 05/12/2020 11/18/2019  WBC 4.0 - 10.5 K/uL 6.9 9.1 6.1   Hemoglobin 12.0 - 15.0 g/dL 11.0(L) 12.8 13.2  Hematocrit 36 - 46 % 34.1(L) 38.6 39.9  Platelets 150 - 400 K/uL 232 293 317   CMP Latest Ref Rng & Units 05/14/2020 05/13/2020 05/12/2020  Glucose 70 - 99 mg/dL 104(H) - 102(H)  BUN 8 - 23 mg/dL 27(H) - 27(H)  Creatinine 0.44 - 1.00 mg/dL 1.67(H) - 1.67(H)  Sodium 135 - 145 mmol/L 140 - 138  Potassium 3.5 - 5.1 mmol/L 4.4 - 4.2  Chloride 98 - 111 mmol/L 107 - 105  CO2 22 - 32 mmol/L 23 - 24  Calcium 8.9 - 10.3 mg/dL 8.1(L) - 8.6(L)  Total Protein 6.5 - 8.1 g/dL - 7.4 -  Total Bilirubin 0.3 - 1.2 mg/dL - 0.6 -  Alkaline Phos 38 - 126 U/L - 68 -  AST 15 - 41 U/L - 13(L) -  ALT 0 - 44 U/L - 11 -     Imaging studies: No new pertinent imaging studies   Assessment/Plan: (ICD-10's: K45.23) 69 y.o. female with abdominal pain found to have acute uncomplicated diverticulitis.              - Okay for CLD  - Wean from IVF resuscitation as diet advances             - Continue IV ABx (Zosyn)             - Monitor abdominal examination; on-going bowel function             - Pain control prn; antiemetics prn             - Monitor for leukocytosis/fevers              -  No need for emergent surgical intervention              - Mobilization as feasible             - Further management per primary service; we follow  All of the above findings and recommendations were discussed with the patient and all of patient's questions were answered to her expressed satisfaction.expressed satisfaction.  -- Edison Simon, PA-C [facility] 05/15/2020, 7:11 AM [phone] M-F: 7am - 4pm

## 2020-05-15 NOTE — Progress Notes (Signed)
Whitney Antigua, MD [address], [place], [place], [place], [address] [address], [place], [place], [place], [address] Phone: [phone]  Fax: [phone]   Subjective:  Pt tolerating clear liquid diet. No N/V. Abdominal pain improving  Objective: Exam: Vital signs in last 24 hours: Vitals:   05/14/20 2344 05/15/20 0458 05/15/20 0726 05/15/20 1542  BP: 112/60 118/65 (!) 142/68 (!) 155/78  Pulse: 77 73 68 62  Resp: 16 15 16 18   Temp: 98.3 F (36.8 C) 97.7 F (36.5 C) 98.3 F (36.8 C) 98 F (36.7 C)  TempSrc: Oral Oral Oral Oral  SpO2: 94% 96% 97% 100%  Weight:      Height:       Weight change:   Intake/Output Summary (Last 24 hours) at 05/15/2020 1848 Last data filed at 05/15/2020 1021 Gross per 24 hour  Intake 2075.14 ml  Output 500 ml  Net 1575.14 ml    General: No acute distress, AAO x3 Abd: Soft, NT/ND, No HSM Skin: Warm, no rashes Neck: Supple, Trachea midline   Lab Results: Lab Results  Component Value Date   WBC 6.9 05/14/2020   HGB 11.0 (L) 05/14/2020   HCT 34.1 (L) 05/14/2020   MCV 91.2 05/14/2020   PLT 232 05/14/2020   Micro Results: Recent Results (from the past 240 hour(s))  Respiratory Panel by RT PCR (Flu A&B, Covid) - Nasopharyngeal Swab     Status: None   Collection Time: 05/13/20 10:48 AM   Specimen: Nasopharyngeal Swab  Result Value Ref Range Status   SARS Coronavirus 2 by RT PCR NEGATIVE NEGATIVE Final    Comment: (NOTE) SARS-CoV-2 target nucleic acids are NOT DETECTED.  The SARS-CoV-2 RNA is generally detectable in upper respiratoy specimens during the acute phase of infection. The lowest concentration of SARS-CoV-2 viral copies this assay can detect is 131 copies/mL. A negative result does not preclude SARS-Cov-2 infection and should not be used as the sole basis for treatment or other patient management decisions. A negative result may occur with  improper specimen collection/handling, submission of specimen  other than nasopharyngeal swab, presence of viral mutation(s) within the areas targeted by this assay, and inadequate number of viral copies (<131 copies/mL). A negative result must be combined with clinical observations, patient history, and epidemiological information. The expected result is Negative.  Fact Sheet for Patients:  PinkCheek.be  Fact Sheet for Healthcare Providers:  GravelBags.it  This test is no t yet approved or cleared by the [place] FDA and  has been authorized for detection and/or diagnosis of SARS-CoV-2 by FDA under an Emergency Use Authorization (EUA). This EUA will remain  in effect (meaning this test can be used) for the duration of the COVID-19 declaration under Section 564(b)(1) of the Act, 21 U.S.C. section 360bbb-3(b)(1), unless the authorization is terminated or revoked sooner.     Influenza A by PCR NEGATIVE NEGATIVE Final   Influenza B by PCR NEGATIVE NEGATIVE Final    Comment: (NOTE) The Xpert Xpress SARS-CoV-2/FLU/RSV assay is intended as an aid in  the diagnosis of influenza from Nasopharyngeal swab specimens and  should not be used as a sole basis for treatment. Nasal washings and  aspirates are unacceptable for Xpert Xpress SARS-CoV-2/FLU/RSV  testing.  Fact Sheet for Patients: PinkCheek.be  Fact Sheet for Healthcare Providers: GravelBags.it  This test is not yet approved or cleared by the [place] FDA and  has been authorized for detection and/or diagnosis of SARS-CoV-2 by  FDA under an Emergency Use  Authorization (EUA). This EUA will remain  in effect (meaning this test can be used) for the duration of the  Covid-19 declaration under Section 564(b)(1) of the Act, 21  U.S.C. section 360bbb-3(b)(1), unless the authorization is  terminated or revoked. Performed at [facility], [address],  [place], [place] [address]    Studies/Results: No results found. Medications:  Scheduled Meds: . dicyclomine  10 mg Oral Once  . enoxaparin (LOVENOX) injection  0.5 mg/kg Subcutaneous Q24H  . influenza vaccine adjuvanted  0.5 mL Intramuscular Tomorrow-1000  . pantoprazole (PROTONIX) IV  40 mg Intravenous Q24H   Continuous Infusions: . sodium chloride 75 mL/hr at 05/15/20 1021  . piperacillin-tazobactam (ZOSYN)  IV 3.375 g (05/15/20 1025)   PRN Meds:.alum & mag hydroxide-simeth, flecainide, gabapentin, hydrALAZINE, HYDROmorphone (DILAUDID) injection, ondansetron **OR** ondansetron (ZOFRAN) IV, oxyCODONE-acetaminophen, rOPINIRole   Assessment: Principal Problem:   Acute diverticulitis Active Problems:   Obesity (BMI 35.0-39.9 without comorbidity)   Paroxysmal atrial fibrillation ([place])   Obstructive sleep apnea    Plan: Continue antibiotic course for diverticulitis  follow-up with Dr. Vicente Males in 4 to 6 weeks to reschedule colonoscopy  Patient improving as expected   LOS: 2 days   Whitney Antigua, MD 05/15/2020, 6:48 PM

## 2020-05-15 NOTE — Progress Notes (Addendum)
PROGRESS NOTE    Whitney [name]  DXA:[id number] DOB: [birth year] DOA: 05/13/2020 PCP: Ronnell Freshwater, NP    Chief Complaint  Patient presents with  . Chest Pain  . Abdominal Pain    Brief Narrative:   69 year old lady with prior h/o Stage 3 CKD, S/P LEFT SIDED nephrectomy, depression, hypertension, presents to ED with nausea, left sided abd pain.  CT abdomen showed Moderate uncomplicated sigmoid diverticulitis. Superimposed severesigmoid diverticulosis. Given marked circumferential focal thickening of the bowel and apparent shouldering, an underlying annular mass is difficult to exclude and correlation with recent screening colonoscopy is recommended.  She was started on IV zosyn and general surgery consulted.  LAST colonoscopy in 09/2019, was poor prep, revealed 4 to 6 mm polyps in the ascending colon, removed with a cold snare and biopsy negative for dysplasia.  Pt had recurrent pain on clear liquid diet, despite IV morphine. Made her NPO, changed to IV dilaudid and added oxycodone pain relief.    Assessment & Plan:   Principal Problem:   Acute diverticulitis Active Problems:   Obesity (BMI 35.0-39.9 without comorbidity)   Paroxysmal atrial fibrillation (HCC)   Obstructive sleep apnea  Acute Diverticulitis: - started the patient on IV zosyn, continue the same.  - pt reports persistent abd pain, change to NPO for now. - continue with IV fluids for hydration.  - continue with IV zofran, IV dilaudid and oral oxycodone for pain relief.  - general surgery consulted for recommendations.    Body mass index is 40.77 kg/m. Obesity:  Recommend outpatient follow up with PCP.    AKI superimposed Stage 3 a CKD:  Creatinine improved to 1.4 from 1.6 with IV fluids.    PAF: Rate controlled, on Flecainide.  NSR.    Hyperlipidemia:  Resume lipitor.    Essential Hypertension:  - Bp parameters are better controlled.       DVT prophylaxis: (Lovenox) Code Status: full  code.  Family Communication: none at bedside Disposition:   Status is: Inpatient  Remains inpatient appropriate because:Ongoing diagnostic testing needed not appropriate for outpatient work up, IV treatments appropriate due to intensity of illness or inability to take PO and Inpatient level of care appropriate due to severity of illness due to severe diverticulitis.    Dispo: The patient is from: Home              Anticipated d/c is to: Home              Anticipated d/c date is: 2 days              Patient currently is not medically stable to d/c.       Consultants:   General surgery   Procedures: none.   Antimicrobials:  Antibiotics Given (last 72 hours)    Date/Time Action Medication Dose Rate   05/13/20 1047 New Bag/Given   piperacillin-tazobactam (ZOSYN) IVPB 3.375 g 3.375 g 12.5 mL/hr   05/13/20 1938 New Bag/Given   piperacillin-tazobactam (ZOSYN) IVPB 3.375 g 3.375 g 12.5 mL/hr   05/14/20 0242 New Bag/Given   piperacillin-tazobactam (ZOSYN) IVPB 3.375 g 3.375 g 12.5 mL/hr   05/14/20 1258 New Bag/Given   piperacillin-tazobactam (ZOSYN) IVPB 3.375 g 3.375 g 12.5 mL/hr   05/14/20 2046 New Bag/Given   piperacillin-tazobactam (ZOSYN) IVPB 3.375 g 3.375 g 12.5 mL/hr   05/15/20 0259 New Bag/Given   piperacillin-tazobactam (ZOSYN) IVPB 3.375 g 3.375 g 12.5 mL/hr   05/15/20 1025 New Bag/Given   piperacillin-tazobactam (ZOSYN)  IVPB 3.375 g 3.375 g 12.5 mL/hr       Subjective: Persistent, nausea, belching and persistent abdominal pain in the lower quadrant.   Objective: Vitals:   05/14/20 2020 05/14/20 2344 05/15/20 0458 05/15/20 0726  BP: 139/71 112/60 118/65 (!) 142/68  Pulse: 75 77 73 68  Resp: 16 16 15 16   Temp: 98.1 F (36.7 C) 98.3 F (36.8 C) 97.7 F (36.5 C) 98.3 F (36.8 C)  TempSrc: Oral Oral Oral Oral  SpO2: 95% 94% 96% 97%  Weight:      Height:        Intake/Output Summary (Last 24 hours) at 05/15/2020 1243 Last data filed at 05/15/2020  1021 Gross per 24 hour  Intake 2075.14 ml  Output 500 ml  Net 1575.14 ml   Filed Weights   05/12/20 2339  Weight: 111.1 kg    Examination:  General exam: alert and in mild distress from belching and abd pain.  Respiratory system: clear to auscultation, no wheezing or rhonchi.  Cardiovascular system: S1S2 RRR, no JVD, no pedal edema.  Gastrointestinal system: Abdomen is soft, mild to mod tenderness int he lower quadrant. Bowel sounds wnl.  Central nervous system: Alert and oriented, non focal.  Extremities: no cyanosis or clubbing.  Skin: No rashes seen.  Psychiatry:  Mood is appropriate.     Data Reviewed: I have personally reviewed following labs and imaging studies  CBC: Recent Labs  Lab 05/12/20 2348 05/14/20 0632  WBC 9.1 6.9  HGB 12.8 11.0*  HCT 38.6 34.1*  MCV 89.4 91.2  PLT 293 233    Basic Metabolic Panel: Recent Labs  Lab 05/12/20 2348 05/14/20 0632 05/15/20 1015  NA 138 140 142  K 4.2 4.4 4.2  CL 105 107 109  CO2 24 23 24   GLUCOSE 102* 104* 114*  BUN 27* 27* 19  CREATININE 1.67* 1.67* 1.47*  CALCIUM 8.6* 8.1* 8.3*    GFR: Estimated Creatinine Clearance: 44.8 mL/min (A) (by C-G formula based on SCr of 1.47 mg/dL (H)).  Liver Function Tests: Recent Labs  Lab 05/13/20 0128  AST 13*  ALT 11  ALKPHOS 68  BILITOT 0.6  PROT 7.4  ALBUMIN 3.2*    CBG: Recent Labs  Lab 05/13/20 2145  GLUCAP 75     Recent Results (from the past 240 hour(s))  Respiratory Panel by RT PCR (Flu A&B, Covid) - Nasopharyngeal Swab     Status: None   Collection Time: 05/13/20 10:48 AM   Specimen: Nasopharyngeal Swab  Result Value Ref Range Status   SARS Coronavirus 2 by RT PCR NEGATIVE NEGATIVE Final    Comment: (NOTE) SARS-CoV-2 target nucleic acids are NOT DETECTED.  The SARS-CoV-2 RNA is generally detectable in upper respiratoy specimens during the acute phase of infection. The lowest concentration of SARS-CoV-2 viral copies this assay can detect  is 131 copies/mL. A negative result does not preclude SARS-Cov-2 infection and should not be used as the sole basis for treatment or other patient management decisions. A negative result may occur with  improper specimen collection/handling, submission of specimen other than nasopharyngeal swab, presence of viral mutation(s) within the areas targeted by this assay, and inadequate number of viral copies (<131 copies/mL). A negative result must be combined with clinical observations, patient history, and epidemiological information. The expected result is Negative.  Fact Sheet for Patients:  PinkCheek.be  Fact Sheet for Healthcare Providers:  GravelBags.it  This test is no t yet approved or cleared by the [place] and  has been authorized for detection and/or diagnosis of SARS-CoV-2 by FDA under an Emergency Use Authorization (EUA). This EUA will remain  in effect (meaning this test can be used) for the duration of the COVID-19 declaration under Section 564(b)(1) of the Act, 21 U.S.C. section 360bbb-3(b)(1), unless the authorization is terminated or revoked sooner.     Influenza A by PCR NEGATIVE NEGATIVE Final   Influenza B by PCR NEGATIVE NEGATIVE Final    Comment: (NOTE) The Xpert Xpress SARS-CoV-2/FLU/RSV assay is intended as an aid in  the diagnosis of influenza from Nasopharyngeal swab specimens and  should not be used as a sole basis for treatment. Nasal washings and  aspirates are unacceptable for Xpert Xpress SARS-CoV-2/FLU/RSV  testing.  Fact Sheet for Patients: PinkCheek.be  Fact Sheet for Healthcare Providers: GravelBags.it  This test is not yet approved or cleared by the [place] FDA and  has been authorized for detection and/or diagnosis of SARS-CoV-2 by  FDA under an Emergency Use Authorization (EUA). This EUA will remain  in effect  (meaning this test can be used) for the duration of the  Covid-19 declaration under Section 564(b)(1) of the Act, 21  U.S.C. section 360bbb-3(b)(1), unless the authorization is  terminated or revoked. Performed at [facility], [address] [place], [place] [address]          Radiology Studies: No results found.      Scheduled Meds: . dicyclomine  10 mg Oral Once  . enoxaparin (LOVENOX) injection  0.5 mg/kg Subcutaneous Q24H  . influenza vaccine adjuvanted  0.5 mL Intramuscular Tomorrow-1000  . pantoprazole (PROTONIX) IV  40 mg Intravenous Q24H   Continuous Infusions: . sodium chloride 75 mL/hr at 05/15/20 1021  . piperacillin-tazobactam (ZOSYN)  IV 3.375 g (05/15/20 1025)     LOS: 2 days        Hosie Poisson, MD [facility]   To contact the attending provider between 7A-7P or the covering provider during after hours 7P-7A, please log into the web site [email or web address] and access using universal [place] password for that web site. If you do not have the password, please call the hospital operator.  05/15/2020, 12:43 PM

## 2020-05-16 DIAGNOSIS — K219 Gastro-esophageal reflux disease without esophagitis: Secondary | ICD-10-CM | POA: Diagnosis not present

## 2020-05-16 DIAGNOSIS — K5792 Diverticulitis of intestine, part unspecified, without perforation or abscess without bleeding: Secondary | ICD-10-CM | POA: Diagnosis not present

## 2020-05-16 LAB — BASIC METABOLIC PANEL
Anion gap: 7 (ref 5–15)
BUN: 14 mg/dL (ref 8–23)
CO2: 26 mmol/L (ref 22–32)
Calcium: 8.5 mg/dL — ABNORMAL LOW (ref 8.9–10.3)
Chloride: 107 mmol/L (ref 98–111)
Creatinine, Ser: 1.29 mg/dL — ABNORMAL HIGH (ref 0.44–1.00)
GFR, Estimated: 42 mL/min — ABNORMAL LOW (ref >60)
Glucose, Bld: 87 mg/dL (ref 70–99)
Potassium: 4.6 mmol/L (ref 3.5–5.1)
Sodium: 140 mmol/L (ref 135–145)

## 2020-05-16 MED ORDER — CARVEDILOL 3.125 MG PO TABS
6.2500 mg | ORAL_TABLET | Freq: Two times a day (BID) | ORAL | Status: DC
  Administered 2020-05-16 – 2020-05-17 (×2): 6.25 mg via ORAL
  Filled 2020-05-16 (×2): qty 2

## 2020-05-16 MED ORDER — ATORVASTATIN CALCIUM 20 MG PO TABS
40.0000 mg | ORAL_TABLET | Freq: Every day | ORAL | Status: DC
  Administered 2020-05-16: 40 mg via ORAL
  Filled 2020-05-16: qty 2

## 2020-05-16 MED ORDER — AMLODIPINE BESYLATE 5 MG PO TABS
5.0000 mg | ORAL_TABLET | Freq: Every day | ORAL | Status: DC
  Administered 2020-05-16 – 2020-05-17 (×2): 5 mg via ORAL
  Filled 2020-05-16 (×2): qty 1

## 2020-05-16 NOTE — Progress Notes (Signed)
Pt was walking with PT when pt got dizzy. VS was taken and recorded. Pt went back to room via rolling chair. Started to have burping episodes, abdominal cramping and N/V. Med given, MD notified.   05/16/20 1054  Vitals  BP 139/84  MAP (mmHg) 96  BP Method Automatic  Pulse Rate 86  MEWS COLOR  MEWS Score Color Green  Oxygen Therapy  SpO2 100 %  MEWS Score  MEWS Temp 0  MEWS Systolic 0  MEWS Pulse 0  MEWS RR 0  MEWS LOC 0  MEWS Score 0

## 2020-05-16 NOTE — Progress Notes (Signed)
PROGRESS NOTE    Whitney [name]  VOH:[id number] DOB: 04-15-1951 DOA: 05/13/2020 PCP: Ronnell Freshwater, NP    Chief Complaint  Patient presents with  . Chest Pain  . Abdominal Pain      Subjective:  The patient was seen and examined this morning, remained stable. Complained of generalized weaknesses, dizziness continue to have burping episodes, abdominal cramping nausea but no vomiting.       Brief Narrative:   69 year old lady with prior h/o Stage 3 CKD, S/P LEFT SIDED nephrectomy, depression, hypertension, presents to ED with nausea, left sided abd pain.  CT abdomen showed Moderate uncomplicated sigmoid diverticulitis. Superimposed severesigmoid diverticulosis. Given marked circumferential focal thickening of the bowel and apparent shouldering, an underlying annular mass is difficult to exclude and correlation with recent screening colonoscopy is recommended.  She was started on IV zosyn and general surgery consulted.  LAST colonoscopy in 09/2019, was poor prep, revealed polyps and biopsy negative for dysplasia.    Assessment & Plan:   Principal Problem:   Acute diverticulitis Active Problems:   Obesity (BMI 35.0-39.9 without comorbidity)   Paroxysmal atrial fibrillation (HCC)   Obstructive sleep apnea  Acute Diverticulitis:  -Advancing diet slowly today clear liquid -Still complaining of burping, nausea but no vomiting, improved abdominal pain - started the patient on IV zosyn >>> continue antibiotics -Continue gentle IV fluid hydration -As needed antiemetics - general surgery consulted for recommendations.    Body mass index is 40.77 kg/m. Obesity:  Recommend outpatient follow up with PCP.    Stage 3 a CKD:  Creatinine stable at 1.67, 1.47,>> 1.29 Avoiding nephrotoxins   PAF: Rate controlled, on Flecainide.  NSR.  Not on anticoagulant chronically   Hyperlipidemia:  Resume lipitor.  Stable   Essential Hypertension:  -Blood pressure elevated  this a.m. 165/93 -As needed hydralazine -Resuming home medication of Norvasc, Coreg,     DVT prophylaxis: (Lovenox) Code Status: full code.  Family Communication: none at bedside Disposition:   Status is: Inpatient  Remains inpatient appropriate because:Ongoing diagnostic testing needed not appropriate for outpatient work up, IV treatments appropriate due to intensity of illness or inability to take PO and Inpatient level of care appropriate due to severity of illness due to severe diverticulitis.    Dispo: The patient is from: Home              Anticipated d/c is to: Home              Anticipated d/c date is: 2 days              Patient currently is not medically stable to d/c.       Consultants:   General surgery   Procedures: none.   Antimicrobials:  Antibiotics Given (last 72 hours)    Date/Time Action Medication Dose Rate   05/13/20 1938 New Bag/Given   piperacillin-tazobactam (ZOSYN) IVPB 3.375 g 3.375 g 12.5 mL/hr   05/14/20 0242 New Bag/Given   piperacillin-tazobactam (ZOSYN) IVPB 3.375 g 3.375 g 12.5 mL/hr   05/14/20 1258 New Bag/Given   piperacillin-tazobactam (ZOSYN) IVPB 3.375 g 3.375 g 12.5 mL/hr   05/14/20 2046 New Bag/Given   piperacillin-tazobactam (ZOSYN) IVPB 3.375 g 3.375 g 12.5 mL/hr   05/15/20 0259 New Bag/Given   piperacillin-tazobactam (ZOSYN) IVPB 3.375 g 3.375 g 12.5 mL/hr   05/15/20 1025 New Bag/Given   piperacillin-tazobactam (ZOSYN) IVPB 3.375 g 3.375 g 12.5 mL/hr   05/15/20 2138 New Bag/Given   piperacillin-tazobactam (ZOSYN) IVPB  3.375 g 3.375 g 12.5 mL/hr   05/16/20 0347 New Bag/Given   piperacillin-tazobactam (ZOSYN) IVPB 3.375 g 3.375 g 12.5 mL/hr     Objective: Vitals:   05/15/20 1542 05/16/20 0015 05/16/20 1054 05/16/20 1102  BP: (!) 155/78 (!) 156/73 139/84 (!) 165/93  Pulse: 62 71 86 77  Resp: 18 20    Temp: 98 F (36.7 C) 98.3 F (36.8 C)    TempSrc: Oral Oral    SpO2: 100% 96% 100% 100%  Weight:      Height:          Intake/Output Summary (Last 24 hours) at 05/16/2020 1300 Last data filed at 05/16/2020 1019 Gross per 24 hour  Intake 660 ml  Output --  Net 660 ml   Filed Weights   05/12/20 2339  Weight: 111.1 kg      Physical Exam:   General:  Alert, oriented, cooperative, no distress;   HEENT:  Normocephalic, PERRL, otherwise with in Normal limits   Neuro:  CNII-XII intact. , normal motor and sensation, reflexes intact   Lungs:   Clear to auscultation BL, Respirations unlabored, no wheezes / crackles  Cardio:    S1/S2, RRR, No murmure, No Rubs or Gallops   Abdomen:   Soft, non-tender, bowel sounds active all four quadrants,  no guarding or peritoneal signs.  Muscular skeletal:  Limited exam - in bed, able to move all 4 extremities, Normal strength,  2+ pulses,  symmetric, No pitting edema  Skin:  Dry, warm to touch, negative for any Rashes, No open wounds  Wounds: Please see nursing documentation         Data Reviewed: I have personally reviewed following labs and imaging studies  CBC: Recent Labs  Lab 05/12/20 2348 05/14/20 0632  WBC 9.1 6.9  HGB 12.8 11.0*  HCT 38.6 34.1*  MCV 89.4 91.2  PLT 293 371    Basic Metabolic Panel: Recent Labs  Lab 05/12/20 2348 05/14/20 0632 05/15/20 1015 05/16/20 0431  NA 138 140 142 140  K 4.2 4.4 4.2 4.6  CL 105 107 109 107  CO2 24 23 24 26   GLUCOSE 102* 104* 114* 87  BUN 27* 27* 19 14  CREATININE 1.67* 1.67* 1.47* 1.29*  CALCIUM 8.6* 8.1* 8.3* 8.5*    GFR: Estimated Creatinine Clearance: 51.1 mL/min (A) (by C-G formula based on SCr of 1.29 mg/dL (H)).  Liver Function Tests: Recent Labs  Lab 05/13/20 0128  AST 13*  ALT 11  ALKPHOS 68  BILITOT 0.6  PROT 7.4  ALBUMIN 3.2*    CBG: Recent Labs  Lab 05/13/20 2145  GLUCAP 75     Recent Results (from the past 240 hour(s))  Respiratory Panel by RT PCR (Flu A&B, Covid) - Nasopharyngeal Swab     Status: None   Collection Time: 05/13/20 10:48 AM   Specimen:  Nasopharyngeal Swab  Result Value Ref Range Status   SARS Coronavirus 2 by RT PCR NEGATIVE NEGATIVE Final    Comment: (NOTE) SARS-CoV-2 target nucleic acids are NOT DETECTED.  The SARS-CoV-2 RNA is generally detectable in upper respiratoy specimens during the acute phase of infection. The lowest concentration of SARS-CoV-2 viral copies this assay can detect is 131 copies/mL. A negative result does not preclude SARS-Cov-2 infection and should not be used as the sole basis for treatment or other patient management decisions. A negative result may occur with  improper specimen collection/handling, submission of specimen other than nasopharyngeal swab, presence of viral mutation(s) within the areas  targeted by this assay, and inadequate number of viral copies (<131 copies/mL). A negative result must be combined with clinical observations, patient history, and epidemiological information. The expected result is Negative.  Fact Sheet for Patients:  PinkCheek.be  Fact Sheet for Healthcare Providers:  GravelBags.it  This test is no t yet approved or cleared by the [place] FDA and  has been authorized for detection and/or diagnosis of SARS-CoV-2 by FDA under an Emergency Use Authorization (EUA). This EUA will remain  in effect (meaning this test can be used) for the duration of the COVID-19 declaration under Section 564(b)(1) of the Act, 21 U.S.C. section 360bbb-3(b)(1), unless the authorization is terminated or revoked sooner.     Influenza A by PCR NEGATIVE NEGATIVE Final   Influenza B by PCR NEGATIVE NEGATIVE Final    Comment: (NOTE) The Xpert Xpress SARS-CoV-2/FLU/RSV assay is intended as an aid in  the diagnosis of influenza from Nasopharyngeal swab specimens and  should not be used as a sole basis for treatment. Nasal washings and  aspirates are unacceptable for Xpert Xpress SARS-CoV-2/FLU/RSV  testing.  Fact Sheet  for Patients: PinkCheek.be  Fact Sheet for Healthcare Providers: GravelBags.it  This test is not yet approved or cleared by the [place] FDA and  has been authorized for detection and/or diagnosis of SARS-CoV-2 by  FDA under an Emergency Use Authorization (EUA). This EUA will remain  in effect (meaning this test can be used) for the duration of the  Covid-19 declaration under Section 564(b)(1) of the Act, 21  U.S.C. section 360bbb-3(b)(1), unless the authorization is  terminated or revoked. Performed at [facility], [address] [place], [place] [address]          Radiology Studies: No results found.      Scheduled Meds: . amLODipine  5 mg Oral Daily  . atorvastatin  40 mg Oral QHS  . carvedilol  6.25 mg Oral BID WC  . dicyclomine  10 mg Oral Once  . enoxaparin (LOVENOX) injection  0.5 mg/kg Subcutaneous Q24H  . influenza vaccine adjuvanted  0.5 mL Intramuscular Tomorrow-1000  . pantoprazole (PROTONIX) IV  40 mg Intravenous Q24H   Continuous Infusions: . sodium chloride 75 mL/hr at 05/15/20 1021  . piperacillin-tazobactam (ZOSYN)  IV 3.375 g (05/16/20 0347)     LOS: 3 days        Deatra James, MD [facility]   To contact the attending provider between 7A-7P or the covering provider during after hours 7P-7A, please log into the web site [email or web address] and access using universal [place] password for that web site. If you do not have the password, please call the hospital operator.  05/16/2020, 1:00 PM

## 2020-05-16 NOTE — Care Management Important Message (Signed)
Important Message  Patient Details  Name: TURNER KUNZMAN MRN: [id number] Date of Birth: 02-24-1951   Medicare Important Message Given:  Yes     Dannette Barbara 05/16/2020, 10:08 AM

## 2020-05-16 NOTE — Progress Notes (Signed)
Physical Therapy Treatment Patient Details Name: Whitney [name] MRN: [id number] DOB: 1951-06-03 Today's Date: 05/16/2020    History of Present Illness Pt is a 69 y.o. female presenting to hospital 10/9 with chest pain, L sided abdominal pain, and pain across back.  Pt admitted with acute sigmoid diverticulitis, paroxysmal a-fib, htn, and dehydration.  PMH includes L-sided nephrectomy, depression, CKD, htn, RLS, sleep apnea, B hearing loss, symptomatic bradycardia, and h/o L TKA (pt reports plan to schedule R TKA).    PT Comments    Pt resting in bed upon PT arrival; pt reporting feeling "sluggish" today but felt good yesterday.  SBA with transfers and CGA ambulating to/from bathroom for toileting.  Then pt ambulated in hallway with SPC 60 feet with CGA; mild antalgic gait noted d/t R knee pain.  Then suddenly pt leaned to R grabbing onto railing in hallway and reporting being dizzy/lightheaded.  Chair brought to pt immediately and pt assisted into sitting.  BP 139/84 with HR 77 bpm and pt reporting symptoms improving (nurse present taking pt's vitals).  Pt brought back to room via rolling chair and when pt stood initially to get back to bed pt reporting being dizzy again and assisted back to sitting in chair for safety.  Utilized 3 person assist to transfer pt back to bed d/t symptoms of dizziness and lightheadedness (symptoms improved with rest in bed); BP 165/93.  Pt also noted to be "burping" repetitively after these symptoms started.  Nurse present with pt end of session.    Follow Up Recommendations  Outpatient PT (continue OP PT services)     Equipment Recommendations  Rolling walker with 5" wheels;Cane (pt has RW and SPC at home already)    Recommendations for Other Services       Precautions / Restrictions Precautions Precautions: Fall Restrictions Weight Bearing Restrictions: No    Mobility  Bed Mobility Overal bed mobility: Needs Assistance Bed Mobility: Supine to Sit;Sit  to Supine     Supine to sit: Modified independent (Device/Increase time);HOB elevated (no difficulties noted) Sit to supine: +2 for physical assistance;HOB elevated   General bed mobility comments: 2 assist sit to semi-supine in bed (increased assist d/t pt's dizziness and lightheadedness) and then 2 assist to boost pt up in bed using bed sheets  Transfers Overall transfer level: Needs assistance Equipment used: None Transfers: Sit to/from Stand Sit to Stand: Supervision         General transfer comment: x3 trials standing from bed SBA; x1 trial standing from toilet SBA; 3 assist to transfer pt from chair back to bed d/t dizziness and lightheadedness  Ambulation/Gait Ambulation/Gait assistance: Min guard Gait Distance (Feet):  (10 feet x2 (to bathroom and back); 60 feet) Assistive device: Straight cane Gait Pattern/deviations: Antalgic Gait velocity: decreased   General Gait Details: decreased stance time R LE (d/t R knee pain); limited distance ambulating d/t dizziness/lightheadedness   Stairs             Wheelchair Mobility    Modified Rankin (Stroke Patients Only)       Balance Overall balance assessment: Needs assistance Sitting-balance support: No upper extremity supported;Feet supported Sitting balance-Leahy Scale: Normal Sitting balance - Comments: steady sitting reaching outside BOS   Standing balance support: No upper extremity supported Standing balance-Leahy Scale: Good Standing balance comment: steady standing washing hands at sink and throwing away paper towel in trash  Cognition Arousal/Alertness: Awake/alert Behavior During Therapy: WFL for tasks assessed/performed Overall Cognitive Status: Within Functional Limits for tasks assessed                                        Exercises      General Comments   Nursing cleared pt for participation in physical therapy.  Pt agreeable to PT  session.      Pertinent Vitals/Pain Pain Assessment: Faces Faces Pain Scale: Hurts a little bit (2/10 at rest; 6/10 at times during session) Pain Location: L LQ and back pain Pain Descriptors / Indicators: Constant;Discomfort;Grimacing;Cramping Pain Intervention(s): Limited activity within patient's tolerance;Monitored during session;Repositioned  HR WFL during sessions activities.    Home Living                      Prior Function            PT Goals (current goals can now be found in the care plan section) Acute Rehab PT Goals Patient Stated Goal: to improve pain PT Goal Formulation: With patient Time For Goal Achievement: 05/28/20 Potential to Achieve Goals: Fair Progress towards PT goals: Progressing toward goals    Frequency    Min 2X/week      PT Plan Current plan remains appropriate    Co-evaluation              AM-PAC PT "6 Clicks" Mobility   Outcome Measure  Help needed turning from your back to your side while in a flat bed without using bedrails?: None Help needed moving from lying on your back to sitting on the side of a flat bed without using bedrails?: None Help needed moving to and from a bed to a chair (including a wheelchair)?: A Little Help needed standing up from a chair using your arms (e.g., wheelchair or bedside chair)?: A Little Help needed to walk in hospital room?: A Little Help needed climbing 3-5 steps with a railing? : A Lot 6 Click Score: 19    End of Session Equipment Utilized During Treatment: Gait belt Activity Tolerance: Treatment limited secondary to medical complications (Comment) (dizziness/lightheadedness with ambulation) Patient left: in bed;with call bell/phone within reach;with bed alarm set;with nursing/sitter in room Nurse Communication: Mobility status;Precautions;Other (comment) (pt's symptoms) PT Visit Diagnosis: Other abnormalities of gait and mobility (R26.89);Pain;Muscle weakness (generalized)  (M62.81) Pain - Right/Left: Right Pain - part of body: Knee     Time: 0488-8916 PT Time Calculation (min) (ACUTE ONLY): 26 min  Charges:  $Gait Training: 8-22 mins $Therapeutic Activity: 8-22 mins                    Leitha Bleak, PT 05/16/20, 12:23 PM

## 2020-05-16 NOTE — Progress Notes (Signed)
[name], MD [address] [place], [place], [place], [address] [address], [place], [place], [place], [address] Phone: [phone]  Fax: [phone]   Subjective: Pt reports improvement in abdominal pain. No fever/chills. Reports flatus   Objective: Exam: Vital signs in last 24 hours: Vitals:   05/15/20 1542 05/16/20 0015 05/16/20 1054 05/16/20 1102  BP: (!) 155/78 (!) 156/73 139/84 (!) 165/93  Pulse: 62 71 86 77  Resp: 18 20    Temp: 98 F (36.7 C) 98.3 F (36.8 C)    TempSrc: Oral Oral    SpO2: 100% 96% 100% 100%  Weight:      Height:       Weight change:   Intake/Output Summary (Last 24 hours) at 05/16/2020 1224 Last data filed at 05/16/2020 1019 Gross per 24 hour  Intake 660 ml  Output --  Net 660 ml    General: No acute distress, AAO x3 Abd: Soft, NT/ND, No HSM Skin: Warm, no rashes Neck: Supple, Trachea midline   Lab Results: Lab Results  Component Value Date   WBC 6.9 05/14/2020   HGB 11.0 (L) 05/14/2020   HCT 34.1 (L) 05/14/2020   MCV 91.2 05/14/2020   PLT 232 05/14/2020   Micro Results: Recent Results (from the past 240 hour(s))  Respiratory Panel by RT PCR (Flu A&B, Covid) - Nasopharyngeal Swab     Status: None   Collection Time: 05/13/20 10:48 AM   Specimen: Nasopharyngeal Swab  Result Value Ref Range Status   SARS Coronavirus 2 by RT PCR NEGATIVE NEGATIVE Final    Comment: (NOTE) SARS-CoV-2 target nucleic acids are NOT DETECTED.  The SARS-CoV-2 RNA is generally detectable in upper respiratoy specimens during the acute phase of infection. The lowest concentration of SARS-CoV-2 viral copies this assay can detect is 131 copies/mL. A negative result does not preclude SARS-Cov-2 infection and should not be used as the sole basis for treatment or other patient management decisions. A negative result may occur with  improper specimen collection/handling, submission of specimen other than nasopharyngeal swab, presence of  viral mutation(s) within the areas targeted by this assay, and inadequate number of viral copies (<131 copies/mL). A negative result must be combined with clinical observations, patient history, and epidemiological information. The expected result is Negative.  Fact Sheet for Patients:  PinkCheek.be  Fact Sheet for Healthcare Providers:  GravelBags.it  This test is no t yet approved or cleared by the [place] FDA and  has been authorized for detection and/or diagnosis of SARS-CoV-2 by FDA under an Emergency Use Authorization (EUA). This EUA will remain  in effect (meaning this test can be used) for the duration of the COVID-19 declaration under Section 564(b)(1) of the Act, 21 U.S.C. section 360bbb-3(b)(1), unless the authorization is terminated or revoked sooner.     Influenza A by PCR NEGATIVE NEGATIVE Final   Influenza B by PCR NEGATIVE NEGATIVE Final    Comment: (NOTE) The Xpert Xpress SARS-CoV-2/FLU/RSV assay is intended as an aid in  the diagnosis of influenza from Nasopharyngeal swab specimens and  should not be used as a sole basis for treatment. Nasal washings and  aspirates are unacceptable for Xpert Xpress SARS-CoV-2/FLU/RSV  testing.  Fact Sheet for Patients: PinkCheek.be  Fact Sheet for Healthcare Providers: GravelBags.it  This test is not yet approved or cleared by the [place] FDA and  has been authorized for detection and/or diagnosis of SARS-CoV-2 by  FDA under an Emergency Use Authorization (EUA). This EUA will remain  in effect (meaning this test can be used) for the duration of the  Covid-19 declaration under Section 564(b)(1) of the Act, 21  U.S.C. section 360bbb-3(b)(1), unless the authorization is  terminated or revoked. Performed at [facility], [address], [place], [place] [address]    Studies/Results: No  results found. Medications:  Scheduled Meds: . dicyclomine  10 mg Oral Once  . enoxaparin (LOVENOX) injection  0.5 mg/kg Subcutaneous Q24H  . influenza vaccine adjuvanted  0.5 mL Intramuscular Tomorrow-1000  . pantoprazole (PROTONIX) IV  40 mg Intravenous Q24H   Continuous Infusions: . sodium chloride 75 mL/hr at 05/15/20 1021  . piperacillin-tazobactam (ZOSYN)  IV 3.375 g (05/16/20 0347)   PRN Meds:.alum & mag hydroxide-simeth, flecainide, gabapentin, hydrALAZINE, HYDROmorphone (DILAUDID) injection, ondansetron **OR** ondansetron (ZOFRAN) IV, oxyCODONE-acetaminophen, rOPINIRole   Assessment: Principal Problem:   Acute diverticulitis Active Problems:   Obesity (BMI 35.0-39.9 without comorbidity)   Paroxysmal atrial fibrillation (HCC)   Obstructive sleep apnea    Plan: Abdominal pain improving.  Patient remains on clear liquid diet.  However, okay to advance to solid food diet at this time, as tolerated  Complete antibiotic course  Pt improving as expected  Patient also reports increased belching and burping, and reflux.  Patient is on Protonix which is appropriate.  Measures to avoid aerophagia discussed, including avoiding swallowing with a straw, avoiding sodas, staying upright after consuming meals and liquids and she verbalized understanding.  No alarm symptoms present to indicate endoscopy at this time  Follow up in GI clinic in 4 weeks with Dr. Vicente Males   LOS: 3 days   [name], MD 05/16/2020, 12:24 PM

## 2020-05-16 NOTE — Plan of Care (Signed)

## 2020-05-17 DIAGNOSIS — K5792 Diverticulitis of intestine, part unspecified, without perforation or abscess without bleeding: Secondary | ICD-10-CM | POA: Diagnosis not present

## 2020-05-17 MED ORDER — BACID PO TABS: 2.0000 | ORAL_TABLET | Freq: Three times a day (TID) | ORAL | Status: DC

## 2020-05-17 MED ORDER — CIPROFLOXACIN HCL 500 MG PO TABS: 500.0000 mg | ORAL_TABLET | Freq: Two times a day (BID) | ORAL | 0 refills | Status: AC

## 2020-05-17 MED ORDER — RISAQUAD PO CAPS
2.0000 | ORAL_CAPSULE | Freq: Three times a day (TID) | ORAL | Status: DC
  Administered 2020-05-17: 2 via ORAL
  Filled 2020-05-17: qty 2

## 2020-05-17 MED ORDER — METRONIDAZOLE 500 MG PO TABS: 500.0000 mg | ORAL_TABLET | Freq: Three times a day (TID) | ORAL | 0 refills | Status: AC

## 2020-05-17 MED ORDER — RISAQUAD PO CAPS: 2.0000 | ORAL_CAPSULE | Freq: Three times a day (TID) | ORAL | 0 refills | Status: AC

## 2020-05-17 NOTE — Addendum Note (Signed)
Addended by: Blain Pais on: 05/17/2020 05:58 PM   Modules accepted: Orders

## 2020-05-17 NOTE — Discharge Summary (Signed)
Physician Discharge Summary [facility]    Patient: Whitney [name]                   Admit date: 05/13/2020   DOB: [birth year]             Discharge date:05/17/2020/9:10 AM GHW:[id number]                          PCP: Whitney Freshwater, NP  Disposition: HOME   Recommendations for Outpatient Follow-up:   . Follow up: in 1 week  . Follow up in GI clinic in 4 weeks with Dr. Vicente Males  Discharge Condition: Stable   Code Status:   Code Status: Full Code  Diet recommendation: Regular healthy diet   Discharge Diagnoses:    Principal Problem:   Acute diverticulitis Active Problems:   Obesity (BMI 35.0-39.9 without comorbidity)   Paroxysmal atrial fibrillation (HCC)   Obstructive sleep apnea   History of Present Illness/ Hospital Course Whitney [name] Summary:     69 year old lady with prior h/o Stage 3 CKD, S/P LEFT SIDED nephrectomy, depression, hypertension, presents to ED with nausea, left sided abd pain.  CT abdomen showed Moderate uncomplicated sigmoid diverticulitis. Superimposed severesigmoid diverticulosis. Given marked circumferential focal thickening of the bowel and apparent shouldering, an underlying annular mass is difficult to exclude and correlation with recent screening colonoscopy is recommended.  She was started on IV zosyn and general surgery consulted.  LAST colonoscopy in 09/2019, was poor prep, revealed polyps and biopsy negative for dysplasia.    Acute Diverticulitis:  -Advanced diet --- tolerating   -Still complaining of burping, nausea but no vomiting, improved abdominal pain - started the patient on IV zosyn >>> 4 days >> changed antibiotics to PO Cipro and Flagyl for 10 mote days  -Continue gentle IV fluid hydration -As needed antiemetics - general surgery consulted for recommendations.    Body mass index is 40.77 kg/m. Obesity:  Recommend outpatient follow up with PCP.    Stage 3 a CKD:  Creatinine stable at 1.67, 1.47,>>  1.29 Avoiding nephrotoxins   PAF: Rate controlled, on Flecainide.  NSR.  Not on anticoagulant chronically   Hyperlipidemia:  Resume lipitor.  Stable   Essential Hypertension:  -Blood pressure elevated this a.m. 1 -Resuming home medication of Norvasc, Coreg,     Code Status: full code.  Family Communication: none at bedside Disposition:   Dispo: The patient is from: Home  Anticipated d/c is to: Home   Discharge Instructions:   Discharge Instructions    Activity as tolerated - No restrictions   Complete by: As directed    Call MD for:  difficulty breathing, headache or visual disturbances   Complete by: As directed    Call MD for:  persistant dizziness or light-headedness   Complete by: As directed    Call MD for:  persistant nausea and vomiting   Complete by: As directed    Call MD for:  severe uncontrolled pain   Complete by: As directed    Call MD for:  temperature >100.4   Complete by: As directed    Diet - low sodium heart healthy   Complete by: As directed    Discharge instructions   Complete by: As directed    F/up with PCP   Increase activity slowly   Complete by: As directed        Medication List    STOP taking these medications  flecainide 100 MG tablet Commonly known as: TAMBOCOR   hydrALAZINE 50 MG tablet Commonly known as: APRESOLINE   omeprazole 40 MG capsule Commonly known as: PRILOSEC   traZODone 50 MG tablet Commonly known as: DESYREL     TAKE these medications   Alli 60 MG capsule Generic drug: orlistat Take 60 mg by mouth 3 (three) times daily with meals.   amLODipine 5 MG tablet Commonly known as: NORVASC Take 1 tablet (5 mg total) by mouth 2 (two) times daily.   aspirin EC 81 MG tablet Take 81 mg by mouth daily.   atorvastatin 40 MG tablet Commonly known as: LIPITOR Take 1 tablet (40 mg total) by mouth daily at 6 PM. What changed: when to take this   carvedilol 3.125 MG tablet Commonly  known as: COREG TAKE 2 TABLETS(6.25 MG) BY MOUTH TWICE DAILY WITH A MEAL What changed: See the new instructions.   ciprofloxacin 500 MG tablet Commonly known as: Cipro Take 1 tablet (500 mg total) by mouth 2 (two) times daily for 10 days.   gabapentin 100 MG capsule Commonly known as: NEURONTIN Take 100 mg by mouth 2 (two) times daily.   metroNIDAZOLE 500 MG tablet Commonly known as: Flagyl Take 1 tablet (500 mg total) by mouth 3 (three) times daily for 10 days.   OVER THE COUNTER MEDICATION Apply 1 application topically in the morning and at bedtime. Tummy cream   rOPINIRole 0.5 MG tablet Commonly known as: REQUIP Take 0.5-1 mg by mouth 2 (two) times daily as needed (pain).   traMADol 50 MG tablet Commonly known as: Ultram Take one tab po bid for pain   Vitamin D 50 MCG (2000 UT) tablet Take 2,000 Units by mouth in the morning and at bedtime.   Voltaren 1 % Gel Generic drug: diclofenac Sodium Apply 1 application topically 4 (four) times daily as needed (pain).       Allergies  Allergen Reactions  . Enalapril Swelling  . Lisinopril Swelling     Procedures /Studies:   DG Chest 2 View  Result Date: 05/13/2020 CLINICAL DATA:  Chest pain. EXAM: CHEST - 2 VIEW COMPARISON:  Dec 19, 2017 FINDINGS: The heart size and mediastinal contours are within normal limits. Both lungs are clear. The visualized skeletal structures are unremarkable. Moderate degenerative changes are noted of the left glenohumeral joint. IMPRESSION: No acute cardiopulmonary process. Electronically Signed   By: Constance Holster M.D.   On: 05/13/2020 00:38   CT Renal Stone Study  Result Date: 05/13/2020 CLINICAL DATA:  Left flank pain, acute renal insufficiency EXAM: CT ABDOMEN AND PELVIS WITHOUT CONTRAST TECHNIQUE: Multidetector CT imaging of the abdomen and pelvis was performed following the standard protocol without IV contrast. COMPARISON:  05/27/2019 FINDINGS: Lower chest: The visualized lung  bases are clear bilaterally. Extensive coronary artery calcification. Global cardiac size is within normal limits. Tiny hiatal hernia. Hepatobiliary: Simple cyst noted within the right hepatic lobe. Cholecystectomy has been performed. No intra or extrahepatic biliary ductal dilation. Pancreas: Unremarkable Spleen: Unremarkable Adrenals/Urinary Tract: The adrenal glands are unremarkable. Left nephrectomy has been performed. The right kidney is normal in position and demonstrates mild compensatory hypertrophy. No intrarenal or ureteral calculi are identified. No hydronephrosis. The bladder is unremarkable. Stomach/Bowel: There is severe distal descending and sigmoid colonic diverticulosis. There are superimposed extensive pericolonic inflammatory changes noted involving the proximal sigmoid colon within the left lower quadrant of the abdomen in keeping with acute sigmoid diverticulitis. There is no free intraperitoneal gas. Trace free  fluid within the left pericolic gutter. No loculated intra-abdominal fluid collections are seen. There is no evidence of obstruction. There is notable shouldering noted, best appreciated on coronal image # 90/5 and a annular colonic mass, while considered less likely, is not completely excluded on this examination. The stomach, small bowel, and large bowel are otherwise unremarkable. Appendix normal. Vascular/Lymphatic: Moderate aortoiliac atherosclerotic calcification is present. No aortic aneurysm. No pathologic adenopathy within the abdomen and pelvis. Reproductive: Uterus and bilateral adnexa are unremarkable. Other: Rectum unremarkable. Musculoskeletal: Degenerative changes are seen throughout the lumbar spine. No lytic or blastic bone lesions are seen. IMPRESSION: Moderate uncomplicated sigmoid diverticulitis. Superimposed severe sigmoid diverticulosis. Given marked circumferential focal thickening of the bowel and apparent shouldering, an underlying annular mass is difficult to  exclude and correlation with recent screening colonoscopy is recommended. Aortic Atherosclerosis (ICD10-I70.0). Electronically Signed   By: Fidela Salisbury MD   On: 05/13/2020 04:14    Subjective:   Patient was seen and examined 05/17/2020, 9:10 AM Patient stable today. No acute distress.  No issues overnight Stable for discharge.  Discharge Exam:    Vitals:   05/16/20 1102 05/16/20 1557 05/16/20 2311 05/17/20 0810  BP: (!) 165/93 (!) 142/79 124/60 136/87  Pulse: 77 78 75 62  Resp:  16 18 16   Temp:  98.3 F (36.8 C) 98.2 F (36.8 C) 97.9 F (36.6 C)  TempSrc:  Oral Oral Oral  SpO2: 100% 99% 96% 98%  Weight:      Height:        General: Pt lying comfortably in bed & appears in no obvious distress. Cardiovascular: S1 & S2 heard, RRR, S1/S2 +. No murmurs, rubs, gallops or clicks. No JVD or pedal edema. Respiratory: Clear to auscultation without wheezing, rhonchi or crackles. No increased work of breathing. Abdominal:  Non-distended, non-tender & soft. No organomegaly or masses appreciated. Normal bowel sounds heard. CNS: Alert and oriented. No focal deficits. Extremities: no edema, no cyanosis    The results of significant diagnostics from this hospitalization (including imaging, microbiology, ancillary and laboratory) are listed below for reference.      Microbiology:   Recent Results (from the past 240 hour(s))  Respiratory Panel by RT PCR (Flu A&B, Covid) - Nasopharyngeal Swab     Status: None   Collection Time: 05/13/20 10:48 AM   Specimen: Nasopharyngeal Swab  Result Value Ref Range Status   SARS Coronavirus 2 by RT PCR NEGATIVE NEGATIVE Final    Comment: (NOTE) SARS-CoV-2 target nucleic acids are NOT DETECTED.  The SARS-CoV-2 RNA is generally detectable in upper respiratoy specimens during the acute phase of infection. The lowest concentration of SARS-CoV-2 viral copies this assay can detect is 131 copies/mL. A negative result does not preclude  SARS-Cov-2 infection and should not be used as the sole basis for treatment or other patient management decisions. A negative result may occur with  improper specimen collection/handling, submission of specimen other than nasopharyngeal swab, presence of viral mutation(s) within the areas targeted by this assay, and inadequate number of viral copies (<131 copies/mL). A negative result must be combined with clinical observations, patient history, and epidemiological information. The expected result is Negative.  Fact Sheet for Patients:  PinkCheek.be  Fact Sheet for Healthcare Providers:  GravelBags.it  This test is no t yet approved or cleared by the [place] FDA and  has been authorized for detection and/or diagnosis of SARS-CoV-2 by FDA under an Emergency Use Authorization (EUA). This EUA will remain  in effect (meaning  this test can be used) for the duration of the COVID-19 declaration under Section 564(b)(1) of the Act, 21 U.S.C. section 360bbb-3(b)(1), unless the authorization is terminated or revoked sooner.     Influenza A by PCR NEGATIVE NEGATIVE Final   Influenza B by PCR NEGATIVE NEGATIVE Final    Comment: (NOTE) The Xpert Xpress SARS-CoV-2/FLU/RSV assay is intended as an aid in  the diagnosis of influenza from Nasopharyngeal swab specimens and  should not be used as a sole basis for treatment. Nasal washings and  aspirates are unacceptable for Xpert Xpress SARS-CoV-2/FLU/RSV  testing.  Fact Sheet for Patients: PinkCheek.be  Fact Sheet for Healthcare Providers: GravelBags.it  This test is not yet approved or cleared by the [place] FDA and  has been authorized for detection and/or diagnosis of SARS-CoV-2 by  FDA under an Emergency Use Authorization (EUA). This EUA will remain  in effect (meaning this test can be used) for the duration of the   Covid-19 declaration under Section 564(b)(1) of the Act, 21  U.S.C. section 360bbb-3(b)(1), unless the authorization is  terminated or revoked. Performed at [facility], [address] [place], [place] [address]      Labs:   CBC: Recent Labs  Lab 05/12/20 2348 05/14/20 0632  WBC 9.1 6.9  HGB 12.8 11.0*  HCT 38.6 34.1*  MCV 89.4 91.2  PLT 293 562   Basic Metabolic Panel: Recent Labs  Lab 05/12/20 2348 05/14/20 0632 05/15/20 1015 05/16/20 0431  NA 138 140 142 140  K 4.2 4.4 4.2 4.6  CL 105 107 109 107  CO2 24 23 24 26   GLUCOSE 102* 104* 114* 87  BUN 27* 27* 19 14  CREATININE 1.67* 1.67* 1.47* 1.29*  CALCIUM 8.6* 8.1* 8.3* 8.5*   Liver Function Tests: Recent Labs  Lab 05/13/20 0128  AST 13*  ALT 11  ALKPHOS 68  BILITOT 0.6  PROT 7.4  ALBUMIN 3.2*   BNP (last 3 results) No results for input(s): BNP in the last 8760 hours. Cardiac Enzymes: No results for input(s): CKTOTAL, CKMB, CKMBINDEX, TROPONINI in the last 168 hours. CBG: Recent Labs  Lab 05/13/20 2145  GLUCAP 75   Hgb A1c No results for input(s): HGBA1C in the last 72 hours. Lipid Profile No results for input(s): CHOL, HDL, LDLCALC, TRIG, CHOLHDL, LDLDIRECT in the last 72 hours. Thyroid function studies No results for input(s): TSH, T4TOTAL, T3FREE, THYROIDAB in the last 72 hours.  Invalid input(s): FREET3 Anemia work up No results for input(s): VITAMINB12, FOLATE, FERRITIN, TIBC, IRON, RETICCTPCT in the last 72 hours. Urinalysis    Component Value Date/Time   COLORURINE YELLOW (A) 05/13/2020 1156   APPEARANCEUR HAZY (A) 05/13/2020 1156   APPEARANCEUR Clear 10/11/2019 0000   LABSPEC 1.015 05/13/2020 1156   PHURINE 5.0 05/13/2020 1156   GLUCOSEU NEGATIVE 05/13/2020 1156   HGBUR NEGATIVE 05/13/2020 [place] 05/13/2020 1156   BILIRUBINUR negative 05/10/2020 1112   BILIRUBINUR Negative 10/11/2019 0000   KETONESUR NEGATIVE 05/13/2020 1156   PROTEINUR >=300  (A) 05/13/2020 1156   UROBILINOGEN 0.2 05/10/2020 1112   NITRITE NEGATIVE 05/13/2020 1156   LEUKOCYTESUR NEGATIVE 05/13/2020 1156         Time coordinating discharge: Over 45 minutes  SIGNED: Deatra James, MD, FACP, FHM. [facility],  Please use [email or web address] to Page If 7PM-7AM, please contact night-coverage [email or web address], Password [facility] 05/17/2020, 9:10 AM

## 2020-05-17 NOTE — Progress Notes (Signed)
Whitney Antigua, MD [address] [place], [place], [place], [address] [address], [place], [place], [place], [address] Phone: [phone]  Fax: [phone]   Subjective: Patient reports improvement abdominal pain.  Tolerating solid food diet without difficulty.  No nausea or vomiting.  Reports flatus.   Objective: Exam: Vital signs in last 24 hours: Vitals:   05/16/20 1102 05/16/20 1557 05/16/20 2311 05/17/20 0810  BP: (!) 165/93 (!) 142/79 124/60 136/87  Pulse: 77 78 75 62  Resp:  16 18 16   Temp:  98.3 F (36.8 C) 98.2 F (36.8 C) 97.9 F (36.6 C)  TempSrc:  Oral Oral Oral  SpO2: 100% 99% 96% 98%  Weight:      Height:       Weight change:   Intake/Output Summary (Last 24 hours) at 05/17/2020 1149 Last data filed at 05/17/2020 1022 Gross per 24 hour  Intake 360 ml  Output -  Net 360 ml    General: No acute distress, AAO x3 Abd: Soft, NT/ND, No HSM Skin: Warm, no rashes Neck: Supple, Trachea midline   Lab Results: Lab Results  Component Value Date   WBC 6.9 05/14/2020   HGB 11.0 (L) 05/14/2020   HCT 34.1 (L) 05/14/2020   MCV 91.2 05/14/2020   PLT 232 05/14/2020   Micro Results: Recent Results (from the past 240 hour(s))  Respiratory Panel by RT PCR (Flu A&B, Covid) - Nasopharyngeal Swab     Status: None   Collection Time: 05/13/20 10:48 AM   Specimen: Nasopharyngeal Swab  Result Value Ref Range Status   SARS Coronavirus 2 by RT PCR NEGATIVE NEGATIVE Final    Comment: (NOTE) SARS-CoV-2 target nucleic acids are NOT DETECTED.  The SARS-CoV-2 RNA is generally detectable in upper respiratoy specimens during the acute phase of infection. The lowest concentration of SARS-CoV-2 viral copies this assay can detect is 131 copies/mL. A negative result does not preclude SARS-Cov-2 infection and should not be used as the sole basis for treatment or other patient management decisions. A negative result may occur with  improper specimen  collection/handling, submission of specimen other than nasopharyngeal swab, presence of viral mutation(s) within the areas targeted by this assay, and inadequate number of viral copies (<131 copies/mL). A negative result must be combined with clinical observations, patient history, and epidemiological information. The expected result is Negative.  Fact Sheet for Patients:  PinkCheek.be  Fact Sheet for Healthcare Providers:  GravelBags.it  This test is no t yet approved or cleared by the [place] FDA and  has been authorized for detection and/or diagnosis of SARS-CoV-2 by FDA under an Emergency Use Authorization (EUA). This EUA will remain  in effect (meaning this test can be used) for the duration of the COVID-19 declaration under Section 564(b)(1) of the Act, 21 U.S.C. section 360bbb-3(b)(1), unless the authorization is terminated or revoked sooner.     Influenza A by PCR NEGATIVE NEGATIVE Final   Influenza B by PCR NEGATIVE NEGATIVE Final    Comment: (NOTE) The Xpert Xpress SARS-CoV-2/FLU/RSV assay is intended as an aid in  the diagnosis of influenza from Nasopharyngeal swab specimens and  should not be used as a sole basis for treatment. Nasal washings and  aspirates are unacceptable for Xpert Xpress SARS-CoV-2/FLU/RSV  testing.  Fact Sheet for Patients: PinkCheek.be  Fact Sheet for Healthcare Providers: GravelBags.it  This test is not yet approved or cleared by the [place] FDA and  has been authorized for detection and/or diagnosis of SARS-CoV-2 by  FDA under an Emergency Use Authorization (EUA). This EUA will remain  in effect (meaning this test can be used) for the duration of the  Covid-19 declaration under Section 564(b)(1) of the Act, 21  U.S.C. section 360bbb-3(b)(1), unless the authorization is  terminated or revoked. Performed at [facility], [address] [place], [place] [address]    Studies/Results: No results found. Medications:  Scheduled Meds: . acidophilus  2 capsule Oral TID WC  . amLODipine  5 mg Oral Daily  . atorvastatin  40 mg Oral QHS  . carvedilol  6.25 mg Oral BID WC  . dicyclomine  10 mg Oral Once  . enoxaparin (LOVENOX) injection  0.5 mg/kg Subcutaneous Q24H  . pantoprazole (PROTONIX) IV  40 mg Intravenous Q24H   Continuous Infusions: . sodium chloride 75 mL/hr at 05/17/20 0320  . piperacillin-tazobactam (ZOSYN)  IV 3.375 g (05/17/20 1125)   PRN Meds:.alum & mag hydroxide-simeth, flecainide, gabapentin, hydrALAZINE, HYDROmorphone (DILAUDID) injection, ondansetron **OR** ondansetron (ZOFRAN) IV, oxyCODONE-acetaminophen, rOPINIRole   Assessment: Principal Problem:   Acute diverticulitis Active Problems:   Obesity (BMI 35.0-39.9 without comorbidity)   Paroxysmal atrial fibrillation ([place])   Obstructive sleep apnea    Plan: Patient improving as expected  Patient encouraged to eat small meals for the next week, and then high-fiber diet after that as long as she tolerates well  Follow-up with Dr. Vicente Males in 4 to 6 weeks  Patient verbalized understanding of the above   LOS: 4 days   Whitney Antigua, MD 05/17/2020, 11:49 AM

## 2020-05-17 NOTE — TOC Progression Note (Signed)
Transition of Care [facility]) - Progression Note    Patient Details  Name: Whitney [name] MRN: [id number] Date of Birth: May 15, 1951  Transition [facility] Care [facility] CM/SW [place], RN Phone Number: 05/17/2020, 9:07 AM  Clinical Narrative:   RNCM completed OPPT referral and faxed to rehab dept.          Expected Discharge Plan and Services           Expected Discharge Date: 05/17/20                                     Social Determinants of Health (SDOH) Interventions    Readmission Risk Interventions No flowsheet data found.

## 2020-05-17 NOTE — Progress Notes (Signed)
Pt discharging home today. Driven home by sister. Discharge education provided to pt. Verbalized understanding. IV removed. Wheeled to car by staff.  Ronnette Hila

## 2020-05-17 NOTE — Plan of Care (Signed)

## 2020-05-18 ENCOUNTER — Telehealth: Payer: Self-pay

## 2020-05-18 NOTE — Telephone Encounter (Signed)
Spoke to pt and was informed she just came home from hospital on 05/17/20 and today around 1230 pm she started having long floaters in her eyes and black spots, HA's, eyes were burning and tearing up.  Spoke to [facility] and she said that pt may be on strong antibiotics and to keep check on blood pressure and to drink plenty of water.  I informed pt and she had started drinking a lot of water and she said the spots were getting better.  Pt checked bp 143/79  Pulse 91.  Advised if gets worse she should go to ER or urgent care.

## 2020-05-23 ENCOUNTER — Other Ambulatory Visit: Payer: Self-pay

## 2020-05-23 ENCOUNTER — Ambulatory Visit: Payer: Medicare Other | Attending: Pulmonary Disease

## 2020-05-23 ENCOUNTER — Telehealth: Payer: Self-pay

## 2020-05-23 DIAGNOSIS — M25552 Pain in left hip: Secondary | ICD-10-CM

## 2020-05-23 DIAGNOSIS — M6281 Muscle weakness (generalized): Secondary | ICD-10-CM

## 2020-05-23 DIAGNOSIS — M25561 Pain in right knee: Secondary | ICD-10-CM | POA: Insufficient documentation

## 2020-05-23 DIAGNOSIS — G8929 Other chronic pain: Secondary | ICD-10-CM

## 2020-05-23 MED ORDER — FLUCONAZOLE 150 MG PO TABS: ORAL_TABLET | ORAL | 0 refills | Status: DC

## 2020-05-23 NOTE — Therapy (Signed)
[facility] [address] [place], [address] Phone: [phone]   Fax:  [phone]  Physical Therapy (PATIENT UNABLE TO BE TREATED)  Patient Details  Name: Whitney [name] MRN: [id number] Date of Birth: 06-07-1951 Referring Provider (PT): Kurtis Bushman MD   Encounter Date: 05/23/2020   PT End of Session - 05/23/20 1431    Visit Number 9    Number of Visits 13    Date for PT Re-Evaluation 05/16/20    Authorization Type 9/10    PT Start Time 1430    PT Stop Time 1505    PT Time Calculation (min) 35 min    Activity Tolerance Patient tolerated treatment well;Patient limited by pain    Behavior During Therapy [facility] for tasks assessed/performed           Past Medical History:  Diagnosis Date  . Abnormal Pap smear of cervix    Pt states she had colposcopy   . Anxiety   . Arthritis   . Chronic kidney disease    removed left kidney  . Depression   . Dyspnea   . Dysrhythmia   . Hx of unilateral nephrectomy 1979   Left Nephrectomy  . Hypertension   . Lower extremity edema   . Palpitations   . PMB (postmenopausal bleeding)   . Restless leg syndrome   . Sleep apnea    has no cpap  . Vitamin D deficiency     Past Surgical History:  Procedure Laterality Date  . BIOPSY  10/31/2019   Procedure: BIOPSY;  Surgeon: Rush Landmark Telford Nab., MD;  Location: [facility];  Service: Gastroenterology;;  . CARDIAC CATHETERIZATION    . CHOLECYSTECTOMY    . COLONOSCOPY WITH PROPOFOL N/A 06/13/2019   Procedure: COLONOSCOPY WITH PROPOFOL;  Surgeon: [name], MD;  Location: [facility] ENDOSCOPY;  Service: Gastroenterology;  Laterality: N/A;  . COLONOSCOPY WITH PROPOFOL N/A 08/12/2019   Procedure: COLONOSCOPY WITH PROPOFOL;  Surgeon: [name], MD;  Location: [facility] ENDOSCOPY;  Service: Gastroenterology;  Laterality: N/A;  . COLONOSCOPY WITH PROPOFOL N/A 09/19/2019   Procedure: COLONOSCOPY WITH PROPOFOL;  Surgeon: [name], MD;  Location: [facility] ENDOSCOPY;  Service: Gastroenterology;  Laterality: N/A;  . DILATION AND CURETTAGE OF UTERUS    . ESOPHAGOGASTRODUODENOSCOPY (EGD) WITH PROPOFOL N/A 06/13/2019   Procedure: ESOPHAGOGASTRODUODENOSCOPY (EGD) WITH PROPOFOL;  Surgeon: [name], MD;  Location: [facility] ENDOSCOPY;  Service: Gastroenterology;  Laterality: N/A;  Pt will go for COVID test on 62-7-03 as she uses public transportation and will be in the area on that day.   . ESOPHAGOGASTRODUODENOSCOPY (EGD) WITH PROPOFOL N/A 10/31/2019   Procedure: ESOPHAGOGASTRODUODENOSCOPY (EGD) WITH PROPOFOL;  Surgeon: Rush Landmark Telford Nab., MD;  Location: [place];  Service: Gastroenterology;  Laterality: N/A;  . HEMOSTASIS CLIP PLACEMENT  10/31/2019   Procedure: HEMOSTASIS CLIP PLACEMENT;  Surgeon: Irving Copas., MD;  Location: [place];  Service: Gastroenterology;;  . HYSTEROSCOPY WITH D & C N/A 02/16/2017   Procedure: DILATATION AND CURETTAGE [place];  Surgeon: Brayton Mars, MD;  Location: [facility];  Service: Gynecology;  Laterality: N/A;  . JOINT REPLACEMENT    . kidney removal Left   . KIDNEY SURGERY Left 1979   left kidney removed  . LEFT HEART CATH AND CORONARY ANGIOGRAPHY N/A 10/21/2017   Procedure: LEFT HEART CATH AND CORONARY ANGIOGRAPHY;  Surgeon: Teodoro Spray, MD;  Location: [facility];  Service: Cardiovascular;  Laterality: N/A;  . POLYPECTOMY  10/31/2019  Procedure: POLYPECTOMY;  Surgeon: Rush Landmark Telford Nab., MD;  Location: [place];  Service: Gastroenterology;;  . TOTAL KNEE ARTHROPLASTY Left 07/06/2017   Procedure: TOTAL KNEE ARTHROPLASTY;  Surgeon: Lovell Sheehan, MD;  Location: [facility];  Service: Orthopedics;  Laterality: Left;  . UPPER ESOPHAGEAL ENDOSCOPIC ULTRASOUND (EUS) N/A 10/31/2019   Procedure: UPPER ESOPHAGEAL ENDOSCOPIC ULTRASOUND (EUS);  Surgeon: Irving Copas., MD;  Location: [place];  Service: Gastroenterology;  Laterality: N/A;  .  WRIST ARTHROSCOPY Left 1970s  . XI ROBOTIC ASSISTED VENTRAL HERNIA N/A 12/01/2019   Procedure: XI ROBOTIC ASSISTED VENTRAL HERNIA;  Surgeon: Jules Husbands, MD;  Location: [facility];  Service: General;  Laterality: N/A;    There were no vitals filed for this visit.   Subjective Assessment - 05/23/20 1430    Subjective Patient reports shes been doing good but has been in the hospital recently.    Pertinent History s/p TKA 07/06/2017; right knee arthritis with weakness as well with reports of right and left leg "giving way" intermittently; Going to have TKA 6/10    Limitations Walking;Standing;House hold activities;Other (comment)    How long can you sit comfortably? No Limit    How long can you stand comfortably? 2-3 hours    How long can you walk comfortably? 2-3 hours    Diagnostic tests imaging - see scanned docs    Patient Stated Goals Improve motion and strength of  hips and R knee and being able to walk with less/no pain.    Currently in Pain? No/denies    Pain Onset More than a month ago          Manually by SPT: 170/100 Manually by SPT: 165/102  Manually by SPT: 160/98  Using BP Machine: 164/93 Using BP Machine: 174/97 Using BP Machine: 164/96   Not Able to perform exercises during session due to patients elevated BP above appropriate range.   PT Education - 05/23/20 1431    Education provided Yes    Education Details Form/Technique of Exercise and BP    Person(s) Educated Patient    Methods Explanation;Demonstration    Comprehension Verbalized understanding;Returned demonstration            PT Short Term Goals - 04/04/20 1348      PT SHORT TERM GOAL #1   Title Patient will demonstrate independence with HEP to maximize rehab potential.    Time 3    Period Weeks    Status New    Target Date 04/25/20             PT Long Term Goals - 05/23/20 1455      PT LONG TERM GOAL #1   Title Patient will demonstrate independence with progressive HEP to maintain  progress made during therapy.    Time 6    Period Weeks    Status On-going      PT LONG TERM GOAL #2   Title Patient will improve FOTO score to 58 to show an improvement in patients ability to perform functional activities.    Baseline 50 at Eval; 05/23/20: 55    Time 6    Period Weeks    Status On-going      PT LONG TERM GOAL #3   Title Patient will have a worst pain of 3/10 to indicate significant improvement with pain and ability to perform functional activities more functionally.     Baseline 10/10; 05/23/20: 5/10    Time 6    Period Weeks  Status On-going      PT LONG TERM GOAL #4   Title Patient will be able to descend stairs without pain to show improvement in quad strength and knee stability.    Baseline pain with descending stairs    Time 6    Period Weeks    Status Deferred      PT LONG TERM GOAL #5   Title Patient will improve 2MWT test by 30ft to show improvement in patients aerobic capacity and ability to ambulate out in community.    Baseline 457ft at eval.6    Time 6    Period Weeks    Status Deferred                 Plan - 05/23/20 1439    Clinical Impression Statement Continued to monitor patients BP during session; BP was elevated at beginning of session (174/98 mmHg).  BP was reassessed after allowing patient to rest for 5 minutes and it remained elevated (170/100 mmHg). BP was reassessed again 5 minutes later and remained elevated above appropriate ranges to exercise (165/193mmHg). BP was taken throughout session using both manually and by BP machine; results in objective measures. Patient was instructed to call doctor immediately and if symptoms begin go to ER.    Personal Factors and Comorbidities Age;Comorbidity 2    Comorbidities HBP, Obesity    Examination-Activity Limitations Stairs;Locomotion Level;Stand    Examination-Participation Restrictions Shop    Stability/Clinical Decision Making Evolving/Moderate complexity    Clinical Decision  Making Moderate    Rehab Potential Good    Clinical Impairments Affecting Rehab Potential (+) motivated, chronic condition (-) arthritis right knee with pain    PT Frequency 2x / week    PT Duration 6 weeks    PT Treatment/Interventions Electrical Stimulation;Cryotherapy;Moist Heat;Gait training;Stair training;Therapeutic activities;Therapeutic exercise;Balance training;Patient/family education;Neuromuscular re-education;Manual techniques;Passive range of motion;Dry needling;Spinal Manipulations;Joint Manipulations;Functional mobility training;Traction    PT Next Visit Plan Monitor BP throughout session and continue POC    PT Home Exercise Plan Heel Raises, standing hip abduction    Consulted and Agree with Plan of Care Patient           Patient will benefit from skilled therapeutic intervention in order to improve the following deficits and impairments:  Pain, Decreased activity tolerance, Decreased endurance, Decreased range of motion, Decreased strength, Impaired perceived functional ability, Difficulty walking, Decreased mobility, Obesity  Visit Diagnosis: Muscle weakness (generalized)  Chronic pain of right knee  Pain in left hip     Problem List Patient Active Problem List   Diagnosis Date Noted  . Acute diverticulitis 05/13/2020  . Fever blister 10/15/2019  . Cerumen in auditory canal on examination 10/15/2019  . Bilateral hearing loss 10/15/2019  . Need for vaccination against Streptococcus pneumoniae using pneumococcal conjugate vaccine 13 10/15/2019  . Encounter for screening mammogram for malignant neoplasm of breast 10/15/2019  . Screening for osteoporosis 10/15/2019  . Elevated erythrocyte sedimentation rate 11/02/2018  . Lumbar spondylosis 11/02/2018  . PVC's (premature ventricular contractions) 10/18/2018  . Primary osteoarthritis of right knee 09/15/2018  . Encounter for pre-operative examination 09/15/2018  . Obstructive sleep apnea 09/15/2018  . Calculus of  gallbladder without cholecystitis without obstruction 09/15/2018  . Calculus of gallbladder with cholecystitis without biliary obstruction 01/27/2018  . Restless leg syndrome 01/27/2018  . Generalized abdominal pain 01/11/2018  . Cyst of pancreas 01/11/2018  . Other specified disorders of kidney and ureter 01/11/2018  . Primary insomnia 01/11/2018  . Paroxysmal atrial fibrillation (HCC)  11/12/2017  . Essential hypertension 10/25/2017  . Mixed hyperlipidemia 10/25/2017  . Low back pain at multiple sites 10/25/2017  . Dysuria 10/25/2017  . Encounter for general adult medical examination with abnormal findings 10/25/2017  . Vitamin D deficiency 10/25/2017  . Near syncope   . Symptomatic bradycardia 10/19/2017  . Carpal tunnel syndrome 10/12/2017  . Primary osteoarthritis of left knee 07/09/2017  . History of total knee arthroplasty 07/06/2017  . Chest pain 06/29/2017  . Postop check 02/25/2017  . Impingement syndrome of shoulder region 01/20/2017  . Neck pain 01/20/2017  . Obesity (BMI 35.0-39.9 without comorbidity) 07/15/2016  . Endometrial polyp 07/15/2016  . Morbid obesity ([place]) 07/15/2016  . Family history of breast cancer in first degree relative 07/15/2016  . Family history of ovarian cancer 07/15/2016  . Knee pain 07/04/2016  . Bilateral leg pain 06/24/2016   3:10 PM, 05/23/20 Margarito Liner, SPT Student Physical Therapist [place]  [phone]  Margarito Liner 05/23/2020, 3:10 PM  [facility] [address] [place], [address] Phone: [phone]   Fax:  [phone]  Name: Whitney [name] MRN: [id number] Date of Birth: Oct 29, 1950

## 2020-05-23 NOTE — Telephone Encounter (Signed)
Pt called that she had yeast due to antibiotic given from hospital as per heather send diflucan to phar and also lmom that we send pres

## 2020-05-24 ENCOUNTER — Telehealth: Payer: Self-pay

## 2020-05-24 NOTE — Telephone Encounter (Signed)
Pt called probiotic is expensive with pres as per taylor advised is ok to take OTC probiotic

## 2020-05-25 ENCOUNTER — Encounter: Payer: Self-pay | Admitting: Emergency Medicine

## 2020-05-25 ENCOUNTER — Other Ambulatory Visit: Payer: Self-pay

## 2020-05-25 ENCOUNTER — Emergency Department
Admission: EM | Admit: 2020-05-25 | Discharge: 2020-05-25 | Disposition: A | Discharge status: Home / Self Care | Payer: Medicare Other | Attending: Emergency Medicine | Admitting: Emergency Medicine

## 2020-05-25 ENCOUNTER — Emergency Department: Payer: Medicare Other

## 2020-05-25 DIAGNOSIS — Z96652 Presence of left artificial knee joint: Secondary | ICD-10-CM | POA: Insufficient documentation

## 2020-05-25 DIAGNOSIS — K5792 Diverticulitis of intestine, part unspecified, without perforation or abscess without bleeding: Secondary | ICD-10-CM | POA: Diagnosis not present

## 2020-05-25 DIAGNOSIS — K5732 Diverticulitis of large intestine without perforation or abscess without bleeding: Secondary | ICD-10-CM | POA: Diagnosis not present

## 2020-05-25 DIAGNOSIS — R109 Unspecified abdominal pain: Secondary | ICD-10-CM | POA: Diagnosis not present

## 2020-05-25 DIAGNOSIS — I129 Hypertensive chronic kidney disease with stage 1 through stage 4 chronic kidney disease, or unspecified chronic kidney disease: Secondary | ICD-10-CM | POA: Diagnosis not present

## 2020-05-25 DIAGNOSIS — N200 Calculus of kidney: Secondary | ICD-10-CM | POA: Diagnosis not present

## 2020-05-25 DIAGNOSIS — Z7982 Long term (current) use of aspirin: Secondary | ICD-10-CM | POA: Insufficient documentation

## 2020-05-25 DIAGNOSIS — N189 Chronic kidney disease, unspecified: Secondary | ICD-10-CM | POA: Insufficient documentation

## 2020-05-25 DIAGNOSIS — R1032 Left lower quadrant pain: Secondary | ICD-10-CM | POA: Diagnosis present

## 2020-05-25 DIAGNOSIS — K8689 Other specified diseases of pancreas: Secondary | ICD-10-CM | POA: Diagnosis not present

## 2020-05-25 DIAGNOSIS — Z743 Need for continuous supervision: Secondary | ICD-10-CM | POA: Diagnosis not present

## 2020-05-25 DIAGNOSIS — K439 Ventral hernia without obstruction or gangrene: Secondary | ICD-10-CM | POA: Diagnosis not present

## 2020-05-25 DIAGNOSIS — R6889 Other general symptoms and signs: Secondary | ICD-10-CM | POA: Diagnosis not present

## 2020-05-25 LAB — COMPREHENSIVE METABOLIC PANEL
ALT: 29 U/L (ref 0–44)
AST: 40 U/L (ref 15–41)
Albumin: 3.1 g/dL — ABNORMAL LOW (ref 3.5–5.0)
Alkaline Phosphatase: 70 U/L (ref 38–126)
Anion gap: 8 (ref 5–15)
BUN: 13 mg/dL (ref 8–23)
CO2: 26 mmol/L (ref 22–32)
Calcium: 8.6 mg/dL — ABNORMAL LOW (ref 8.9–10.3)
Chloride: 104 mmol/L (ref 98–111)
Creatinine, Ser: 1.44 mg/dL — ABNORMAL HIGH (ref 0.44–1.00)
GFR, Estimated: 39 mL/min — ABNORMAL LOW (ref >60)
Glucose, Bld: 112 mg/dL — ABNORMAL HIGH (ref 70–99)
Potassium: 3.9 mmol/L (ref 3.5–5.1)
Sodium: 138 mmol/L (ref 135–145)
Total Bilirubin: 0.6 mg/dL (ref 0.3–1.2)
Total Protein: 6.7 g/dL (ref 6.5–8.1)

## 2020-05-25 LAB — CBC WITH DIFFERENTIAL/PLATELET
Abs Immature Granulocytes: 0.06 10*3/uL (ref 0.00–0.07)
Basophils Absolute: 0 10*3/uL (ref 0.0–0.1)
Basophils Relative: 0 %
Eosinophils Absolute: 0.2 10*3/uL (ref 0.0–0.5)
Eosinophils Relative: 2 %
HCT: 36 % (ref 36.0–46.0)
Hemoglobin: 11.8 g/dL — ABNORMAL LOW (ref 12.0–15.0)
Immature Granulocytes: 1 %
Lymphocytes Relative: 19 %
Lymphs Abs: 2.1 10*3/uL (ref 0.7–4.0)
MCH: 29.2 pg (ref 26.0–34.0)
MCHC: 32.8 g/dL (ref 30.0–36.0)
MCV: 89.1 fL (ref 80.0–100.0)
Monocytes Absolute: 1 10*3/uL (ref 0.1–1.0)
Monocytes Relative: 10 %
Neutro Abs: 7.4 10*3/uL (ref 1.7–7.7)
Neutrophils Relative %: 68 %
Platelets: 338 10*3/uL (ref 150–400)
RBC: 4.04 MIL/uL (ref 3.87–5.11)
RDW: 12.7 % (ref 11.5–15.5)
WBC: 10.8 10*3/uL — ABNORMAL HIGH (ref 4.0–10.5)
nRBC: 0 % (ref 0.0–0.2)

## 2020-05-25 LAB — LIPASE, BLOOD: Lipase: 28 U/L (ref 11–51)

## 2020-05-25 MED ORDER — MORPHINE SULFATE (PF) 4 MG/ML IV SOLN
4.0000 mg | Freq: Once | INTRAVENOUS | Status: AC
  Administered 2020-05-25: 4 mg via INTRAVENOUS
  Filled 2020-05-25: qty 1

## 2020-05-25 MED ORDER — LACTATED RINGERS IV BOLUS
1000.0000 mL | Freq: Once | INTRAVENOUS | Status: AC
  Administered 2020-05-25: 1000 mL via INTRAVENOUS

## 2020-05-25 MED ORDER — ONDANSETRON HCL 4 MG/2ML IJ SOLN
4.0000 mg | Freq: Once | INTRAMUSCULAR | Status: AC
  Administered 2020-05-25: 4 mg via INTRAVENOUS
  Filled 2020-05-25: qty 2

## 2020-05-25 MED ORDER — IOHEXOL 300 MG/ML  SOLN
100.0000 mL | Freq: Once | INTRAMUSCULAR | Status: AC | PRN
  Administered 2020-05-25: 100 mL via INTRAVENOUS

## 2020-05-25 NOTE — ED Provider Notes (Signed)
[facility] Department Provider Note ____________________________________________   First MD Initiated Contact with Patient 05/25/20 1147     (approximate)  I have reviewed the triage vital signs and the nursing notes.  HISTORY  Chief Complaint Abdominal Pain   HPI Whitney [name] is a 69 y.o. femalewho presents to the ED for evaluation of abdominal pain.   Chart review indicates history of CKD 3 s/p left nephrectomy, HTN, depression, PAF on flecainide and not on anticoagulation.  And recent medical admission for acute diverticulitis from 10/10-10/14.  Patient was discharged home 1 week ago.  Discharged with oral Cipro and Flagyl.  Patient reports adherence with her antibiotic regimen, but notes worsening abdominal pain starting yesterday.  She reports intermittent pain to her LLQ that is up to 9/10 intensity, sharp in nature and occasionally radiating around to her back.  She reports "a few" spasms of this pain over the past 2 days.  She reports tolerating soup to eat last night, but has had nothing today.  She reports a "normal" bowel movement yesterday that made her feel better.  Denies fevers, syncope, vomiting, dysuria, hematochezia, melena or emesis.    Past Medical History:  Diagnosis Date  . Abnormal Pap smear of cervix    Pt states she had colposcopy   . Anxiety   . Arthritis   . Chronic kidney disease    removed left kidney  . Depression   . Dyspnea   . Dysrhythmia   . Hx of unilateral nephrectomy 1979   Left Nephrectomy  . Hypertension   . Lower extremity edema   . Palpitations   . PMB (postmenopausal bleeding)   . Restless leg syndrome   . Sleep apnea    has no cpap  . Vitamin D deficiency     Patient Active Problem List   Diagnosis Date Noted  . Acute diverticulitis 05/13/2020  . Fever blister 10/15/2019  . Cerumen in auditory canal on examination 10/15/2019  . Bilateral hearing loss 10/15/2019  . Need for vaccination  against Streptococcus pneumoniae using pneumococcal conjugate vaccine 13 10/15/2019  . Encounter for screening mammogram for malignant neoplasm of breast 10/15/2019  . Screening for osteoporosis 10/15/2019  . Elevated erythrocyte sedimentation rate 11/02/2018  . Lumbar spondylosis 11/02/2018  . PVC's (premature ventricular contractions) 10/18/2018  . Primary osteoarthritis of right knee 09/15/2018  . Encounter for pre-operative examination 09/15/2018  . Obstructive sleep apnea 09/15/2018  . Calculus of gallbladder without cholecystitis without obstruction 09/15/2018  . Calculus of gallbladder with cholecystitis without biliary obstruction 01/27/2018  . Restless leg syndrome 01/27/2018  . Generalized abdominal pain 01/11/2018  . Cyst of pancreas 01/11/2018  . Other specified disorders of kidney and ureter 01/11/2018  . Primary insomnia 01/11/2018  . Paroxysmal atrial fibrillation ([place]) 11/12/2017  . Essential hypertension 10/25/2017  . Mixed hyperlipidemia 10/25/2017  . Low back pain at multiple sites 10/25/2017  . Dysuria 10/25/2017  . Encounter for general adult medical examination with abnormal findings 10/25/2017  . Vitamin D deficiency 10/25/2017  . Near syncope   . Symptomatic bradycardia 10/19/2017  . Carpal tunnel syndrome 10/12/2017  . Primary osteoarthritis of left knee 07/09/2017  . History of total knee arthroplasty 07/06/2017  . Chest pain 06/29/2017  . Postop check 02/25/2017  . Impingement syndrome of shoulder region 01/20/2017  . Neck pain 01/20/2017  . Obesity (BMI 35.0-39.9 without comorbidity) 07/15/2016  . Endometrial polyp 07/15/2016  . Morbid obesity ([place]) 07/15/2016  . Family history of  breast cancer in first degree relative 07/15/2016  . Family history of ovarian cancer 07/15/2016  . Knee pain 07/04/2016  . Bilateral leg pain 06/24/2016    Past Surgical History:  Procedure Laterality Date  . BIOPSY  10/31/2019   Procedure: BIOPSY;  Surgeon:  Rush Landmark Telford Nab., MD;  Location: [place];  Service: Gastroenterology;;  . CARDIAC CATHETERIZATION    . CHOLECYSTECTOMY    . COLONOSCOPY WITH PROPOFOL N/A 06/13/2019   Procedure: COLONOSCOPY WITH PROPOFOL;  Surgeon: [name], MD;  Location[facility] ENDOSCOPY;  Service: Gastroenterology;  Laterality: N/A;  . COLONOSCOPY WITH PROPOFOL N/A 08/12/2019   Procedure: COLONOSCOPY WITH PROPOFOL;  Surgeon: [name], MD;  Location: [facility] ENDOSCOPY;  Service: Gastroenterology;  Laterality: N/A;  . COLONOSCOPY WITH PROPOFOL N/A 09/19/2019   Procedure: COLONOSCOPY WITH PROPOFOL;  Surgeon: [name], MD;  Location: [facility] ENDOSCOPY;  Service: Gastroenterology;  Laterality: N/A;  . DILATION AND CURETTAGE OF UTERUS    . ESOPHAGOGASTRODUODENOSCOPY (EGD) WITH PROPOFOL N/A 06/13/2019   Procedure: ESOPHAGOGASTRODUODENOSCOPY (EGD) WITH PROPOFOL;  Surgeon: [name], MD;  Location: [facility] END[facility]COPY;  Service: Gastroenterology;  Laterality: N/A;  Pt will go for COVID test on 13-2-44 as she uses public transportation and will be in the area on that day.   . ESOPHAGOGASTRODUODENOSCOPY (EGD) WITH PROPOFOL N/A 10/31/2019   Procedure: ESOPHAGOGASTRODUODENOSCOPY (EGD) WITH PROPOFOL;  Surgeon: Rush Landmark Telford Nab., MD;  Location: [place];  Service: Gastroenterology;  Laterality: N/A;  . HEMOSTASIS CLIP PLACEMENT  10/31/2019   Procedure: HEMOSTASIS CLIP PLACEMENT;  Surgeon: Irving Copas., MD;  Location: [place];  Service: Gastroenterology;;  . HYSTEROSCOPY WITH D & C N/A 02/16/2017   Procedure: DILATATION AND CURETTAGE [place];  Surgeon: Brayton Mars, MD;  Location: [facility]S;  Service: Gynecology;  Laterality: N/A;  . JOINT REPLACEMENT    . kidney removal Left   . KIDNEY SURGERY Left 1979   left kidney removed  . LEFT HEART CATH AND CORONARY ANGIOGRAPHY N/A 10/21/2017   Procedure: LEFT HEART CATH AND CORONARY ANGIOGRAPHY;  Surgeon: Teodoro Spray, MD;  Location: [place] CV  LAB;  Service: Cardiovascular;  Laterality: N/A;  . POLYPECTOMY  10/31/2019   Procedure: POLYPECTOMY;  Surgeon: Rush Landmark Telford Nab., MD;  Location: [place];  Service: Gastroenterology;;  . TOTAL KNEE ARTHROPLASTY Left 07/06/2017   Procedure: TOTAL KNEE ARTHROPLASTY;  Surgeon: Lovell Sheehan, MD;  Location: [facility] ORS;  Service: Orthopedics;  Laterality: Left;  . UPPER ESOPHAGEAL ENDOSCOPIC ULTRASOUND (EUS) N/A 10/31/2019   Procedure: UPPER ESOPHAGEAL ENDOSCOPIC ULTRASOUND (EUS);  Surgeon: Irving Copas., MD;  Location: [place];  Service: Gastroenterology;  Laterality: N/A;  . WRIST ARTHROSCOPY Left 1970s  . XI ROBOTIC ASSISTED VENTRAL HERNIA N/A 12/01/2019   Procedure: XI ROBOTIC ASSISTED VENTRAL HERNIA;  Surgeon: Jules Husbands, MD;  Location: [facility] ORS;  Service: General;  Laterality: N/A;    Prior to Admission medications   Medication Sig Start Date End Date Taking? Authorizing Provider  acidophilus (RISAQUAD) CAPS capsule Take 2 capsules by mouth 3 (three) times daily with meals for 10 days. 05/17/20 05/27/20  Shahmehdi, Valeria Batman, MD  amLODipine (NORVASC) 5 MG tablet Take 1 tablet (5 mg total) by mouth 2 (two) times daily. 01/25/20   Ronnell Freshwater, NP  aspirin EC 81 MG tablet Take 81 mg by mouth daily. Patient not taking: Reported on 05/13/2020    [provider]  atorvastatin (LIPITOR) 40 MG tablet Take 1 tablet (40 mg total) by mouth daily at 6  PM. Patient taking differently: Take 40 mg by mouth at bedtime.  09/29/19   Kendell Bane, NP  carvedilol (COREG) 3.125 MG tablet TAKE 2 TABLETS(6.25 MG) BY MOUTH TWICE DAILY WITH A MEAL Patient taking differently: Take 6.25 mg by mouth in the morning and at bedtime.  08/30/19   Lavera Guise, MD  Cholecalciferol (VITAMIN D) 50 MCG (2000 UT) tablet Take 2,000 Units by mouth in the morning and at bedtime.  Patient not taking: Reported on 05/13/2020    [provider]  ciprofloxacin (CIPRO) 500 MG tablet Take 1  tablet (500 mg total) by mouth 2 (two) times daily for 10 days. 05/17/20 05/27/20  ShahmehdiValeria Batman, MD  diclofenac Sodium (VOLTAREN) 1 % GEL Apply 1 application topically 4 (four) times daily as needed (pain).     [provider]  fluconazole (DIFLUCAN) 150 MG tablet Take 1 tab po every other day 05/23/20   Ronnell Freshwater, NP  gabapentin (NEURONTIN) 100 MG capsule Take 100 mg by mouth 2 (two) times daily.    [provider]  metroNIDAZOLE (FLAGYL) 500 MG tablet Take 1 tablet (500 mg total) by mouth 3 (three) times daily for 10 days. 05/17/20 05/27/20  Shahmehdi, Valeria Batman, MD  orlistat (ALLI) 60 MG capsule Take 60 mg by mouth 3 (three) times daily with meals.    [provider]  OVER THE COUNTER MEDICATION Apply 1 application topically in the morning and at bedtime. Tummy cream    [provider]  rOPINIRole (REQUIP) 0.5 MG tablet Take 0.5-1 mg by mouth 2 (two) times daily as needed (pain).    [provider]  traMADol Veatrice Bourbon) 50 MG tablet Take one tab po bid for pain 05/10/20   Lavera Guise, MD    Allergies Enalapril and Lisinopril  Family History  Problem Relation Age of Onset  . Ovarian cancer Mother   . Hypertension Father   . Diabetes Father   . Cancer Father   . Breast cancer Sister   . Diabetes Sister     Social History Social History   Tobacco Use  . Smoking status: Never Smoker  . Smokeless tobacco: Never Used  Vaping Use  . Vaping Use: Never used  Substance Use Topics  . Alcohol use: Yes    Alcohol/week: 2.0 standard drinks    Types: 2 Standard drinks or equivalent per week    Comment: occasionally  . Drug use: Yes    Frequency: 3.0 times per week    Types: Marijuana, Cocaine    Comment: Pt states she uses for leg pain: marijuana; states no longer uses cocaine    Review of Systems  Constitutional: No fever/chills Eyes: No visual changes. ENT: No sore throat. Cardiovascular: Denies chest pain. Respiratory:  Denies shortness of breath. Gastrointestinal:no vomiting.  No diarrhea.  No constipation.  Positive for abdominal pain and nausea. Genitourinary: Negative for dysuria. Musculoskeletal: Negative for back pain. Skin: Negative for rash. Neurological: Negative for headaches, focal weakness or numbness.  ____________________________________________   PHYSICAL EXAM:  VITAL SIGNS: Vitals:   05/25/20 1259 05/25/20 1400  BP:  (!) 129/95  Pulse:  75  Resp: 13 17  Temp:    SpO2:  97%      Constitutional: Alert and oriented.  Uncomfortable-appearing.  Conversational in full sentences.. Eyes: Conjunctivae are normal. PERRL. EOMI. Head: Atraumatic. Nose: No congestion/rhinnorhea. Mouth/Throat: Mucous membranes are moist.  Oropharynx non-erythematous. Neck: No stridor. No cervical spine tenderness to palpation. Cardiovascular: Normal rate,  regular rhythm. Grossly normal heart sounds.  Good peripheral circulation. Respiratory: Normal respiratory effort.  No retractions. Lungs CTAB. Gastrointestinal: Soft , nondistended. No abdominal bruits. No CVA tenderness. Poorly localizing LLQ tenderness without peritoneal features.  Epigastric tenderness without peritoneal features.  Benign right-sided abdomen. Musculoskeletal: No lower extremity tenderness nor edema.  No joint effusions. No signs of acute trauma. Neurologic:  Normal speech and language. No gross focal neurologic deficits are appreciated. No gait instability noted. Skin:  Skin is warm, dry and intact. No rash noted. Psychiatric: Mood and affect are normal. Speech and behavior are normal.  ____________________________________________   LABS (all labs ordered are listed, but only abnormal results are displayed)  Labs Reviewed  CBC WITH DIFFERENTIAL/PLATELET - Abnormal; Notable for the following components:      Result Value   WBC 10.8 (*)    Hemoglobin 11.8 (*)    All other components within normal limits  COMPREHENSIVE METABOLIC  PANEL - Abnormal; Notable for the following components:   Glucose, Bld 112 (*)    Creatinine, Ser 1.44 (*)    Calcium 8.6 (*)    Albumin 3.1 (*)    GFR, Estimated 39 (*)    All other components within normal limits  LIPASE, BLOOD  URINALYSIS, COMPLETE (UACMP) WITH MICROSCOPIC   ____________________________________________  RADIOLOGY  ED MD interpretation:  CT reviewed   Official radiology report(s): CT ABDOMEN PELVIS W CONTRAST  Result Date: 05/25/2020 CLINICAL DATA:  Diverticulitis with worsening pain. EXAM: CT ABDOMEN AND PELVIS WITH CONTRAST TECHNIQUE: Multidetector CT imaging of the abdomen and pelvis was performed using the standard protocol following bolus administration of intravenous contrast. CONTRAST:  125mL OMNIPAQUE IOHEXOL 300 MG/ML  SOLN COMPARISON:  05/13/2020 FINDINGS: Lower chest: Negative Hepatobiliary: Multiple liver cysts as seen previously, the largest in the right lobe measuring 2.9 cm. Previous cholecystectomy. Pancreas: 11 mm low-density in the body of the pancreas. Spleen: Normal Adrenals/Urinary Tract: Adrenal glands are normal. Left kidney is absent. Nonobstructing 2 mm stone in the upper pole of the right kidney. Right renal vascular calcification. No hydronephrosis. Bladder is normal. Stomach/Bowel: Stomach and small intestine are normal. Improved appearance of the region of diverticulitis previously seen at the descending sigmoid junction. New area of diverticulitis now present in the mid sigmoid region, which was not involved previously. Surrounding soft tissue inflammatory change and few air bubbles but no evidence of drainable abscess. Vascular/Lymphatic: Aortic atherosclerosis without aneurysm. IVC is normal. No retroperitoneal adenopathy. Reproductive: No pelvic mass. Other: Small ventral hernia containing only fat. Musculoskeletal: Lower lumbar degenerative disc disease and degenerative facet disease. IMPRESSION: 1. Improved appearance of the region of  diverticulitis previously seen at the descending/sigmoid junction. New area of diverticulitis in the mid sigmoid region, which was not involved previously. Surrounding soft tissue inflammatory change and few air bubbles but no evidence of drainable abscess. 2. 11 mm low-density in the body of the pancreas. This could represent a cyst or cystic neoplasm. Most likely diagnosis is branch duct IPMN. No apparent change since MRI May 2019. Consider additional follow-up in 2 years. 3. Absent left kidney. 2 mm nonobstructing stone in the upper pole of the right kidney. 4. Aortic atherosclerosis. 5. Small ventral hernia containing only fat. 6. Lower lumbar degenerative disc disease and degenerative facet disease. Aortic Atherosclerosis (ICD10-I70.0). Electronically Signed   By: Nelson Chimes M.D.   On: 05/25/2020 13:53    ____________________________________________   PROCEDURES and INTERVENTIONS  Procedure(s) performed (including Critical Care):  Procedures  Medications  morphine 4  MG/ML injection 4 mg (4 mg Intravenous Given 05/25/20 1255)  ondansetron (ZOFRAN) injection 4 mg (4 mg Intravenous Given 05/25/20 1254)  iohexol (OMNIPAQUE) 300 MG/ML solution 100 mL (100 mLs Intravenous Contrast Given 05/25/20 1328)  lactated ringers bolus 1,000 mL (1,000 mLs Intravenous New Bag/Given 05/25/20 1415)    ____________________________________________   MDM / ED COURSE  69 year old female currently being treated for diverticulitis presents with couple acute episodes of worsening pain, but overall with improving clinical course, amenable to outpatient management with continued antibiotic therapy.  Normal vital signs on room air.  Exam with mild LLQ tenderness without peritoneal features, otherwise benign abdomen.  She is overall well-appearing without distress or other acute pathology.  Blood work without worsening of lactic acidosis and overall unremarkable.  Due to her worsening pain, CT abdomen/pelvis  obtained and demonstrates resolving area of larger diverticulitis, and with small additional area of acute diverticulitis that is likely the source of her pain.  Overall, patient reports improved p.o. intake and resolution of her symptomatic diarrhea since that she clinically is improving overall.  With her remaining 5 days of antibiotics, I think it is reasonable to finish this regimen and follow-up as an outpatient with her PCP.  We discussed return precautions for the ED that would suggest failure of this treatment.  Medically stable for discharge home.  Clinical Course as of May 26 1515  Fri May 25, 2020  1509 Educated patient of CT results with resolving diverticulitis from her previous day, and one small area of additional diverticulitis.  She reports having 5 more days of antibiotics at home and reports feeling well overall.  We discussed the possibility of failure of antibiotics, and she stresses understanding and agreement that this is unlikely and that she would prefer to go home.  We discussed following up with her PCP.  We discussed return precautions for the ED.   [DS]    Clinical Course User Index [DS] Vladimir Crofts, MD     ____________________________________________   FINAL CLINICAL IMPRESSION(S) / ED DIAGNOSES  Final diagnoses:  Diverticulitis  LLQ abdominal pain     ED Discharge Orders    None       Lavontay Kirk Tamala Julian   Note:  This document was prepared using Dragon voice recognition software and may include unintentional dictation errors.   Vladimir Crofts, MD 05/25/20 1520

## 2020-05-25 NOTE — ED Notes (Signed)
See triage note, pt reports LLQ abdominal pain that started last night, recently discharged from hospital for diverticulitis.  Pt now c/o "floaters and spider webs" in both eyes" NAd noted, RR even and unlabored

## 2020-05-25 NOTE — Discharge Instructions (Addendum)
As we discussed, your previous area of diverticulitis is improving, but you have another small area that is likely the source of your new pain.  Please continue to take your antibiotics and finish the remaining 5 days of this regimen.  If you develop any further worsening symptoms despite these medications, please return to the ED.  Otherwise, please follow-up with your primary care physician within the next week for recheck.

## 2020-05-25 NOTE — ED Triage Notes (Signed)
EMS brings pt in from home for c/o left lower abd pain radiating into back x 3wks accomp by nausea; denies urinary or bowel c/o; d/c on Thursday for diverticulitis; pain began again last night at 6pm; pt to triage via w/c with no distress noted; rx filled for probiotics yesterday after f/u with PCP, dx with yeast infection and took diflucan

## 2020-05-29 ENCOUNTER — Other Ambulatory Visit: Payer: Self-pay

## 2020-05-29 ENCOUNTER — Ambulatory Visit: Payer: Medicare Other

## 2020-05-29 DIAGNOSIS — G8929 Other chronic pain: Secondary | ICD-10-CM | POA: Diagnosis not present

## 2020-05-29 DIAGNOSIS — M25561 Pain in right knee: Secondary | ICD-10-CM | POA: Diagnosis not present

## 2020-05-29 DIAGNOSIS — M25552 Pain in left hip: Secondary | ICD-10-CM

## 2020-05-29 DIAGNOSIS — M6281 Muscle weakness (generalized): Secondary | ICD-10-CM

## 2020-05-29 NOTE — Therapy (Signed)
[facility] [address], [place], [address] Phone: [phone]   Fax:  [phone]  Physical Therapy Treatment  Patient Details  Name: Whitney [name] MRN: [id number] Date of Birth: [birth year] Referring Provider (PT): Kurtis Bushman MD   Encounter Date: 05/29/2020   PT End of Session - 05/29/20 1414    Visit Number 10    Number of Visits 13    Date for PT Re-Evaluation 05/16/20    Authorization Type 1/10    PT Start Time 1300    PT Stop Time 1345    PT Time Calculation (min) 45 min    Activity Tolerance Patient tolerated treatment well;Patient limited by pain    Behavior During Therapy [facility] for tasks assessed/performed           Past Medical History:  Diagnosis Date  . Abnormal Pap smear of cervix    Pt states she had colposcopy   . Anxiety   . Arthritis   . Chronic kidney disease    removed left kidney  . Depression   . Dyspnea   . Dysrhythmia   . Hx of unilateral nephrectomy 1979   Left Nephrectomy  . Hypertension   . Lower extremity edema   . Palpitations   . PMB (postmenopausal bleeding)   . Restless leg syndrome   . Sleep apnea    has no cpap  . Vitamin D deficiency     Past Surgical History:  Procedure Laterality Date  . BIOPSY  10/31/2019   Procedure: BIOPSY;  Surgeon: Rush Landmark Telford Nab., MD;  Location: [place];  Service: Gastroenterology;;  . CARDIAC CATHETERIZATION    . CHOLECYSTECTOMY    . COLONOSCOPY WITH PROPOFOL N/A 06/13/2019   Procedure: COLONOSCOPY WITH PROPOFOL;  Surgeon: [name], MD;  Location: [facility] ENDOSCOPY;  Service: Gastroenterology;  Laterality: N/A;  . COLONOSCOPY WITH PROPOFOL N/A 08/12/2019   Procedure: COLONOSCOPY WITH PROPOFOL;  Surgeon: [name], MD;  Location: [facility] ENDOSCOPY;  Service: Gastroenterology;  Laterality: N/A;  . COLONOSCOPY WITH PROPOFOL N/A 09/19/2019   Procedure: COLONOSCOPY WITH PROPOFOL;  Surgeon: [name], MD;  Location:  [facility] ENDOSCOPY;  Service: Gastroenterology;  Laterality: N/A;  . DILATION AND CURETTAGE OF UTERUS    . ESOPHAGOGASTRODUODENOSCOPY (EGD) WITH PROPOFOL N/A 06/13/2019   Procedure: ESOPHAGOGASTRODUODENOSCOPY (EGD) WITH PROPOFOL;  Surgeon: [name], MD;  Location: [facility] ENDOSCOPY;  Service: Gastroenterology;  Laterality: N/A;  Pt will go for COVID test on 93-8-10 as she uses public transportation and will be in the area on that day.   . ESOPHAGOGASTRODUODENOSCOPY (EGD) WITH PROPOFOL N/A 10/31/2019   Procedure: ESOPHAGOGASTRODUODENOSCOPY (EGD) WITH PROPOFOL;  Surgeon: Rush Landmark Telford Nab., MD;  Location: [place];  Service: Gastroenterology;  Laterality: N/A;  . HEMOSTASIS CLIP PLACEMENT  10/31/2019   Procedure: HEMOSTASIS CLIP PLACEMENT;  Surgeon: Irving Copas., MD;  Location: [place];  Service: Gastroenterology;;  . HYSTEROSCOPY WITH D & C N/A 02/16/2017   Procedure: DILATATION AND CURETTAGE [place];  Surgeon: Brayton Mars, MD;  Location: [facility] ORS;  Service: Gynecology;  Laterality: N/A;  . JOINT REPLACEMENT    . kidney removal Left   . KIDNEY SURGERY Left 1979   left kidney removed  . LEFT HEART CATH AND CORONARY ANGIOGRAPHY N/A 10/21/2017   Procedure: LEFT HEART CATH AND CORONARY ANGIOGRAPHY;  Surgeon: Teodoro Spray, MD;  Location: [place] CV LAB;  Service: Cardiovascular;  Laterality: N/A;  . POLYPECTOMY  10/31/2019   Procedure: POLYPECTOMY;  Surgeon: Irving Copas., MD;  Location: [place];  Service: Gastroenterology;;  . TOTAL KNEE ARTHROPLASTY Left 07/06/2017   Procedure: TOTAL KNEE ARTHROPLASTY;  Surgeon: Lovell Sheehan, MD;  Location: [facility] ORS;  Service: Orthopedics;  Laterality: Left;  . UPPER ESOPHAGEAL ENDOSCOPIC ULTRASOUND (EUS) N/A 10/31/2019   Procedure: UPPER ESOPHAGEAL ENDOSCOPIC ULTRASOUND (EUS);  Surgeon: Irving Copas., MD;  Location: [place];  Service: Gastroenterology;  Laterality: N/A;  . WRIST ARTHROSCOPY  Left 1970s  . XI ROBOTIC ASSISTED VENTRAL HERNIA N/A 12/01/2019   Procedure: XI ROBOTIC ASSISTED VENTRAL HERNIA;  Surgeon: Jules Husbands, MD;  Location: [facility] ORS;  Service: General;  Laterality: N/A;    There were no vitals filed for this visit.   Subjective Assessment - 05/29/20 1313    Subjective Patient states she has been taking her BP before coming into her appointment; which was WNL.    Pertinent History s/p TKA 07/06/2017; right knee arthritis with weakness as well with reports of right and left leg "giving way" intermittently; Going to have TKA 6/10    Limitations Walking;Standing;House hold activities;Other (comment)    How long can you sit comfortably? No Limit    How long can you stand comfortably? 2-3 hours    How long can you walk comfortably? 2-3 hours    Diagnostic tests imaging - see scanned docs    Patient Stated Goals Improve motion and strength of  hips and R knee and being able to walk with less/no pain.    Currently in Pain? No/denies    Pain Onset More than a month ago              TREATMENT Therapeutic Exercise Deadlifts in standing - 2 x 8 ; 30# KB Leg press in sitting at [place] - 3 x 20 95# Squats in standing - 2 x 10  10# dumbbell Monster walks - 71ft - Increased difficulty secondary to fatigue - stopped early Performed exercises to improve LE strength     PT Education - 05/29/20 1411    Education provided Yes    Education Details exercise form and technique    Person(s) Educated Patient    Methods Explanation;Demonstration    Comprehension Verbalized understanding;Returned demonstration            PT Short Term Goals - 04/04/20 1348      PT SHORT TERM GOAL #1   Title Patient will demonstrate independence with HEP to maximize rehab potential.    Time 3    Period Weeks    Status New    Target Date 04/25/20             PT Long Term Goals - 05/23/20 1455      PT LONG TERM GOAL #1   Title Patient will demonstrate independence with  progressive HEP to maintain progress made during therapy.    Time 6    Period Weeks    Status On-going      PT LONG TERM GOAL #2   Title Patient will improve FOTO score to 58 to show an improvement in patients ability to perform functional activities.    Baseline 50 at Eval; 05/23/20: 55    Time 6    Period Weeks    Status On-going      PT LONG TERM GOAL #3   Title Patient will have a worst pain of 3/10 to indicate significant improvement with pain and ability to perform functional activities more functionally.     Baseline  10/10; 05/23/20: 5/10    Time 6    Period Weeks    Status On-going      PT LONG TERM GOAL #4   Title Patient will be able to descend stairs without pain to show improvement in quad strength and knee stability.    Baseline pain with descending stairs    Time 6    Period Weeks    Status Deferred      PT LONG TERM GOAL #5   Title Patient will improve 2MWT test by 5ft to show improvement in patients aerobic capacity and ability to ambulate out in community.    Baseline 444ft at eval.6    Time 6    Period Weeks    Status Deferred                 Plan - 05/29/20 1417    Clinical Impression Statement Patient able to tolerate greater amount of exercises today as BP was WNL during the session. Focused on performing compound and dynamic exercise to decrease pain and spasms. Patient fatigues quickly and requires cueing for performance throughout the session. Patient will benefit from further skilled therapy focused on improving limitations to return to prior level of function.    Personal Factors and Comorbidities Age;Comorbidity 2    Comorbidities HBP, Obesity    Examination-Activity Limitations Stairs;Locomotion Level;Stand    Examination-Participation Restrictions Shop    Stability/Clinical Decision Making Evolving/Moderate complexity    Rehab Potential Good    Clinical Impairments Affecting Rehab Potential (+) motivated, chronic condition (-) arthritis  right knee with pain    PT Frequency 2x / week    PT Duration 6 weeks    PT Treatment/Interventions Electrical Stimulation;Cryotherapy;Moist Heat;Gait training;Stair training;Therapeutic activities;Therapeutic exercise;Balance training;Patient/family education;Neuromuscular re-education;Manual techniques;Passive range of motion;Dry needling;Spinal Manipulations;Joint Manipulations;Functional mobility training;Traction    PT Next Visit Plan Monitor BP throughout session and continue POC    PT Home Exercise Plan Heel Raises, standing hip abduction    Consulted and Agree with Plan of Care Patient           Patient will benefit from skilled therapeutic intervention in order to improve the following deficits and impairments:  Pain, Decreased activity tolerance, Decreased endurance, Decreased range of motion, Decreased strength, Impaired perceived functional ability, Difficulty walking, Decreased mobility, Obesity  Visit Diagnosis: Muscle weakness (generalized)  Chronic pain of right knee  Pain in left hip     Problem List Patient Active Problem List   Diagnosis Date Noted  . Acute diverticulitis 05/13/2020  . Fever blister 10/15/2019  . Cerumen in auditory canal on examination 10/15/2019  . Bilateral hearing loss 10/15/2019  . Need for vaccination against Streptococcus pneumoniae using pneumococcal conjugate vaccine 13 10/15/2019  . Encounter for screening mammogram for malignant neoplasm of breast 10/15/2019  . Screening for osteoporosis 10/15/2019  . Elevated erythrocyte sedimentation rate 11/02/2018  . Lumbar spondylosis 11/02/2018  . PVC's (premature ventricular contractions) 10/18/2018  . Primary osteoarthritis of right knee 09/15/2018  . Encounter for pre-operative examination 09/15/2018  . Obstructive sleep apnea 09/15/2018  . Calculus of gallbladder without cholecystitis without obstruction 09/15/2018  . Calculus of gallbladder with cholecystitis without biliary  obstruction 01/27/2018  . Restless leg syndrome 01/27/2018  . Generalized abdominal pain 01/11/2018  . Cyst of pancreas 01/11/2018  . Other specified disorders of kidney and ureter 01/11/2018  . Primary insomnia 01/11/2018  . Paroxysmal atrial fibrillation ([place]) 11/12/2017  . Essential hypertension 10/25/2017  . Mixed hyperlipidemia 10/25/2017  . Low  back pain at multiple sites 10/25/2017  . Dysuria 10/25/2017  . Encounter for general adult medical examination with abnormal findings 10/25/2017  . Vitamin D deficiency 10/25/2017  . Near syncope   . Symptomatic bradycardia 10/19/2017  . Carpal tunnel syndrome 10/12/2017  . Primary osteoarthritis of left knee 07/09/2017  . History of total knee arthroplasty 07/06/2017  . Chest pain 06/29/2017  . Postop check 02/25/2017  . Impingement syndrome of shoulder region 01/20/2017  . Neck pain 01/20/2017  . Obesity (BMI 35.0-39.9 without comorbidity) 07/15/2016  . Endometrial polyp 07/15/2016  . Morbid obesity ([place]) 07/15/2016  . Family history of breast cancer in first degree relative 07/15/2016  . Family history of ovarian cancer 07/15/2016  . Knee pain 07/04/2016  . Bilateral leg pain 06/24/2016    Blythe Stanford, PT DPT 05/29/2020, 2:46 PM  [place] [facility] [address] [place], [address] Phone: [phone]   Fax:  [phone]  Name: Whitney [name] MRN: [id number] Date of Birth: 1951-02-23

## 2020-05-31 ENCOUNTER — Ambulatory Visit: Payer: Medicare Other

## 2020-05-31 DIAGNOSIS — N1831 Chronic kidney disease, stage 3a: Secondary | ICD-10-CM | POA: Diagnosis not present

## 2020-05-31 DIAGNOSIS — R809 Proteinuria, unspecified: Secondary | ICD-10-CM | POA: Diagnosis not present

## 2020-05-31 DIAGNOSIS — N2581 Secondary hyperparathyroidism of renal origin: Secondary | ICD-10-CM | POA: Diagnosis not present

## 2020-05-31 DIAGNOSIS — I10 Essential (primary) hypertension: Secondary | ICD-10-CM | POA: Diagnosis not present

## 2020-06-04 ENCOUNTER — Ambulatory Visit (INDEPENDENT_AMBULATORY_CARE_PROVIDER_SITE_OTHER): Payer: Medicare Other | Admitting: Gastroenterology

## 2020-06-04 ENCOUNTER — Other Ambulatory Visit: Payer: Self-pay

## 2020-06-04 ENCOUNTER — Encounter: Payer: Self-pay | Admitting: Gastroenterology

## 2020-06-04 ENCOUNTER — Ambulatory Visit: Admit: 2020-06-04 | Payer: Medicare Other | Admitting: Gastroenterology

## 2020-06-04 VITALS — BP 127/86 | HR 79 | Temp 98.2°F | Ht 65.0 in | Wt 241.4 lb

## 2020-06-04 DIAGNOSIS — R933 Abnormal findings on diagnostic imaging of other parts of digestive tract: Secondary | ICD-10-CM

## 2020-06-04 DIAGNOSIS — A048 Other specified bacterial intestinal infections: Secondary | ICD-10-CM | POA: Diagnosis not present

## 2020-06-04 DIAGNOSIS — K5732 Diverticulitis of large intestine without perforation or abscess without bleeding: Secondary | ICD-10-CM

## 2020-06-04 DIAGNOSIS — K31A0 Gastric intestinal metaplasia, unspecified: Secondary | ICD-10-CM

## 2020-06-04 SURGERY — COLONOSCOPY WITH PROPOFOL
Anesthesia: General

## 2020-06-04 NOTE — Progress Notes (Signed)
[name] MD, MRCP(U.K) [address]  [place]  [place], [place] [address]  Main: [phone]  Fax: [phone]   Primary Care Physician: Ronnell Freshwater, NP  Primary Gastroenterologist:  Dr. Harland German follow-up  HPI: Whitney [name] is a 69 y.o. female   Summary of history :  Initially seen and referred on 3/31/2020for abdominal pain.  Started after cholecystectomy  Abdominal CT scan in March 2019showed A small low-attenuation area within the body of the pancreas.   MRI of the abdomen in May 2019 which demonstrated benign liver abnormalities including multiple hemangiomas and cysts. Gallstone.'s multiple small cystic foci throughout the pancreas. Findings may represent small pseudocysts or areas of sidebranch duct ectasia. Cystic neoplasm of the pancreas not excluded follow-up imaging in 24 months recommended.  10/19/2018 HIDA scan: Normal. 02/18/2019: H pylori breath test - negative 03/04/2019: MRCP- progression of the gall bladder polyp which is 10 mm in size . Pancreas lesion is stable repeat in 1 year. 06/13/2019 colonoscopy:Large quantity of stool seen in the rectum and sigmoid repeat colonoscopy recommended.  EGD. Medium size hiatal hernia seen nodule seen just past the GE junction. No biopsies takenwas to undergo an EUS with Dr. Owens Loffler but the patient canceled the procedure. Biopsies of the stomach demonstrated H. pylori gastritis.  Underwent laparoscopic cholecystectomy with Dr. Bethanie Dicker November 2020which resolved her longstanding abdominal pain  05/27/2019: CT scan of the abdomen and pelvis showed moderate supraumbilical midline ventral hernia containing inflamed flat and small amount of fluid. Stable small pancreatic cysts.   09/19/2019: Colonoscopy: Poor prep and 3 polyps 4 to 6 mm were resected in the ascending colon with a cold snare. Could not be retrieved since prep was poor   10/31/2019: EUS: Nodule at the GE  junction was resected it was benign.  Biopsies of stomach showed chronic gastritis with focal intestinal metaplasia.  Cystic lesions of the pancreas were noted suggestive of IPMN.   12/01/2019: Ventral hernia repair.   Interval history 02/15/2020-06/04/2020   She was scheduled to undergo her colonoscopy but developed acute diverticulitis this and was admitted to the hospital on 05/14/2020 she underwent a CT scan of the abdomen and pelvis on 05/25/2020 which demonstrated descending colon/sigmoid colon diverticulitis.  The 11 mm lesion in the pancreas was noted.  Patient was seen by surgery and suggested conservative therapy discharged home to follow-up with me as an outpatient she was seen in the ER on 05/25/2020 again for abdominal pain.  She was midway through her antibiotics suggested to complete the course of antibiotics.   02/15/2020: H. pylori breath test: Negative.  Since the hospital discharge she is feeling much better.  Significantly decreased abdominal pain.  No other complaints.   Current Outpatient Medications  Medication Sig Dispense Refill  . amLODipine (NORVASC) 5 MG tablet Take 1 tablet (5 mg total) by mouth 2 (two) times daily. 60 tablet 2  . aspirin EC 81 MG tablet Take 81 mg by mouth daily. (Patient not taking: Reported on 05/13/2020)    . atorvastatin (LIPITOR) 40 MG tablet Take 1 tablet (40 mg total) by mouth daily at 6 PM. (Patient taking differently: Take 40 mg by mouth at bedtime. ) 90 tablet 1  . carvedilol (COREG) 3.125 MG tablet TAKE 2 TABLETS(6.25 MG) BY MOUTH TWICE DAILY WITH A MEAL (Patient taking differently: Take 6.25 mg by mouth in the morning and at bedtime. ) 120 tablet 3  . Cholecalciferol (VITAMIN D) 50 MCG (2000  UT) tablet Take 2,000 Units by mouth in the morning and at bedtime.  (Patient not taking: Reported on 05/13/2020)    . diclofenac Sodium (VOLTAREN) 1 % GEL Apply 1 application topically 4 (four) times daily as needed (pain).     . fluconazole  (DIFLUCAN) 150 MG tablet Take 1 tab po every other day 3 tablet 0  . gabapentin (NEURONTIN) 100 MG capsule Take 100 mg by mouth 2 (two) times daily.    Marland Kitchen orlistat (ALLI) 60 MG capsule Take 60 mg by mouth 3 (three) times daily with meals.    Marland Kitchen OVER THE COUNTER MEDICATION Apply 1 application topically in the morning and at bedtime. Tummy cream    . rOPINIRole (REQUIP) 0.5 MG tablet Take 0.5-1 mg by mouth 2 (two) times daily as needed (pain).    . traMADol (ULTRAM) 50 MG tablet Take one tab po bid for pain 30 tablet 0   No current facility-administered medications for this visit.    Allergies as of 06/04/2020 - Review Complete 05/29/2020  Allergen Reaction Noted  . Enalapril Swelling   . Lisinopril Swelling 05/26/2016    ROS:  General: Negative for anorexia, weight loss, fever, chills, fatigue, weakness. ENT: Negative for hoarseness, difficulty swallowing , nasal congestion. CV: Negative for chest pain, angina, palpitations, dyspnea on exertion, peripheral edema.  Respiratory: Negative for dyspnea at rest, dyspnea on exertion, cough, sputum, wheezing.  GI: See history of present illness. GU:  Negative for dysuria, hematuria, urinary incontinence, urinary frequency, nocturnal urination.  Endo: Negative for unusual weight change.    Physical Examination:   LMP 01/26/2017 Comment: age 19  General: Well-nourished, well-developed in no acute distress.  Eyes: No icterus. Conjunctivae pink. Mouth: Oropharyngeal mucosa moist and pink , no lesions erythema or exudate. Abdomen: Bowel sounds are normal, nontender, nondistended, no hepatosplenomegaly or masses, no abdominal bruits or hernia , no rebound or guarding.   Extremities: No lower extremity edema. No clubbing or deformities. Neuro: Alert and oriented x 3.  Grossly intact. Skin: Warm and dry, no jaundice.   Psych: Alert and cooperative, normal mood and affect.   Imaging Studies: DG Chest 2 View  Result Date: 05/13/2020 CLINICAL  DATA:  Chest pain. EXAM: CHEST - 2 VIEW COMPARISON:  Dec 19, 2017 FINDINGS: The heart size and mediastinal contours are within normal limits. Both lungs are clear. The visualized skeletal structures are unremarkable. Moderate degenerative changes are noted of the left glenohumeral joint. IMPRESSION: No acute cardiopulmonary process. Electronically Signed   By: Constance Holster M.D.   On: 05/13/2020 00:38   CT ABDOMEN PELVIS W CONTRAST  Result Date: 05/25/2020 CLINICAL DATA:  Diverticulitis with worsening pain. EXAM: CT ABDOMEN AND PELVIS WITH CONTRAST TECHNIQUE: Multidetector CT imaging of the abdomen and pelvis was performed using the standard protocol following bolus administration of intravenous contrast. CONTRAST:  141mL OMNIPAQUE IOHEXOL 300 MG/ML  SOLN COMPARISON:  05/13/2020 FINDINGS: Lower chest: Negative Hepatobiliary: Multiple liver cysts as seen previously, the largest in the right lobe measuring 2.9 cm. Previous cholecystectomy. Pancreas: 11 mm low-density in the body of the pancreas. Spleen: Normal Adrenals/Urinary Tract: Adrenal glands are normal. Left kidney is absent. Nonobstructing 2 mm stone in the upper pole of the right kidney. Right renal vascular calcification. No hydronephrosis. Bladder is normal. Stomach/Bowel: Stomach and small intestine are normal. Improved appearance of the region of diverticulitis previously seen at the descending sigmoid junction. New area of diverticulitis now present in the mid sigmoid region, which was not involved previously.  Surrounding soft tissue inflammatory change and few air bubbles but no evidence of drainable abscess. Vascular/Lymphatic: Aortic atherosclerosis without aneurysm. IVC is normal. No retroperitoneal adenopathy. Reproductive: No pelvic mass. Other: Small ventral hernia containing only fat. Musculoskeletal: Lower lumbar degenerative disc disease and degenerative facet disease. IMPRESSION: 1. Improved appearance of the region of diverticulitis  previously seen at the descending/sigmoid junction. New area of diverticulitis in the mid sigmoid region, which was not involved previously. Surrounding soft tissue inflammatory change and few air bubbles but no evidence of drainable abscess. 2. 11 mm low-density in the body of the pancreas. This could represent a cyst or cystic neoplasm. Most likely diagnosis is branch duct IPMN. No apparent change since MRI May 2019. Consider additional follow-up in 2 years. 3. Absent left kidney. 2 mm nonobstructing stone in the upper pole of the right kidney. 4. Aortic atherosclerosis. 5. Small ventral hernia containing only fat. 6. Lower lumbar degenerative disc disease and degenerative facet disease. Aortic Atherosclerosis (ICD10-I70.0). Electronically Signed   By: Nelson Chimes M.D.   On: 05/25/2020 13:53   CT Renal Stone Study  Result Date: 05/13/2020 CLINICAL DATA:  Left flank pain, acute renal insufficiency EXAM: CT ABDOMEN AND PELVIS WITHOUT CONTRAST TECHNIQUE: Multidetector CT imaging of the abdomen and pelvis was performed following the standard protocol without IV contrast. COMPARISON:  05/27/2019 FINDINGS: Lower chest: The visualized lung bases are clear bilaterally. Extensive coronary artery calcification. Global cardiac size is within normal limits. Tiny hiatal hernia. Hepatobiliary: Simple cyst noted within the right hepatic lobe. Cholecystectomy has been performed. No intra or extrahepatic biliary ductal dilation. Pancreas: Unremarkable Spleen: Unremarkable Adrenals/Urinary Tract: The adrenal glands are unremarkable. Left nephrectomy has been performed. The right kidney is normal in position and demonstrates mild compensatory hypertrophy. No intrarenal or ureteral calculi are identified. No hydronephrosis. The bladder is unremarkable. Stomach/Bowel: There is severe distal descending and sigmoid colonic diverticulosis. There are superimposed extensive pericolonic inflammatory changes noted involving the  proximal sigmoid colon within the left lower quadrant of the abdomen in keeping with acute sigmoid diverticulitis. There is no free intraperitoneal gas. Trace free fluid within the left pericolic gutter. No loculated intra-abdominal fluid collections are seen. There is no evidence of obstruction. There is notable shouldering noted, best appreciated on coronal image # 90/5 and a annular colonic mass, while considered less likely, is not completely excluded on this examination. The stomach, small bowel, and large bowel are otherwise unremarkable. Appendix normal. Vascular/Lymphatic: Moderate aortoiliac atherosclerotic calcification is present. No aortic aneurysm. No pathologic adenopathy within the abdomen and pelvis. Reproductive: Uterus and bilateral adnexa are unremarkable. Other: Rectum unremarkable. Musculoskeletal: Degenerative changes are seen throughout the lumbar spine. No lytic or blastic bone lesions are seen. IMPRESSION: Moderate uncomplicated sigmoid diverticulitis. Superimposed severe sigmoid diverticulosis. Given marked circumferential focal thickening of the bowel and apparent shouldering, an underlying annular mass is difficult to exclude and correlation with recent screening colonoscopy is recommended. Aortic Atherosclerosis (ICD10-I70.0). Electronically Signed   By: Fidela Salisbury MD   On: 05/13/2020 04:14    Assessment and Plan:   KANESHA CADLE is a 69 y.o. y/o female here to follow up. She has a history of  abdominal pain that resolved after cholecystectomy when gallbladder polyps was seen on imaging. Previous abdominal imaging showed abnormalities of the pancreas andplan to repeat imaging in a year.  H. pylori gastritis seen on EGD along with gastric intestinal metaplasia..  Plan :  1.Repeat colonoscopy due to poor prep and EGD to evaluate intestinal  metaplasia for gastric mapping in Jan 2022 2. MRI pancreas can be postponed to January or February 2022.   . I have discussed  alternative options, risks & benefits,  which include, but are not limited to, bleeding, infection, perforation,respiratory complication & drug reaction.  The patient agrees with this plan & written consent will be obtained.     Dr [name]  MD,MRCP [facility]) Follow up in 6 months telephone visit

## 2020-06-06 ENCOUNTER — Ambulatory Visit: Payer: Medicare Other | Attending: Orthopedic Surgery

## 2020-06-06 ENCOUNTER — Other Ambulatory Visit: Payer: Self-pay

## 2020-06-06 DIAGNOSIS — M6281 Muscle weakness (generalized): Secondary | ICD-10-CM | POA: Insufficient documentation

## 2020-06-06 DIAGNOSIS — M25552 Pain in left hip: Secondary | ICD-10-CM | POA: Insufficient documentation

## 2020-06-06 DIAGNOSIS — M25561 Pain in right knee: Secondary | ICD-10-CM | POA: Diagnosis not present

## 2020-06-06 DIAGNOSIS — G8929 Other chronic pain: Secondary | ICD-10-CM | POA: Diagnosis not present

## 2020-06-06 NOTE — Addendum Note (Signed)
Addended by: Blain Pais on: 06/06/2020 03:01 PM   Modules accepted: Orders

## 2020-06-06 NOTE — Therapy (Signed)
[facility] [address] [place], [address] Phone: [phone]   Fax:  [phone]  Physical Therapy Treatment  Patient Details  Name: Whitney [name] MRN: [id number] Date of Birth: Mar 22, 1951 Referring Provider (PT): Kurtis Bushman MD   Encounter Date: 06/06/2020   PT End of Session - 06/06/20 1449    Visit Number 11    Number of Visits 21    Date for PT Re-Evaluation 05/30/20    Authorization Type 1/10    PT Start Time 1430    PT Stop Time 1515    PT Time Calculation (min) 45 min    Activity Tolerance Patient tolerated treatment well;Patient limited by pain    Behavior During Therapy [facility] for tasks assessed/performed           Past Medical History:  Diagnosis Date  . Abnormal Pap smear of cervix    Pt states she had colposcopy   . Anxiety   . Arthritis   . Chronic kidney disease    removed left kidney  . Depression   . Dyspnea   . Dysrhythmia   . Hx of unilateral nephrectomy 1979   Left Nephrectomy  . Hypertension   . Lower extremity edema   . Palpitations   . PMB (postmenopausal bleeding)   . Restless leg syndrome   . Sleep apnea    has no cpap  . Vitamin D deficiency     Past Surgical History:  Procedure Laterality Date  . BIOPSY  10/31/2019   Procedure: BIOPSY;  Surgeon: Rush Landmark [place] Nab., MD;  Location: [place];  Service: Gastroenterology;;  . CARDIAC CATHETERIZATION    . CHOLECYSTECTOMY    . COLONOSCOPY WITH PROPOFOL N/A 06/13/2019   Procedure: COLONOSCOPY WITH PROPOFOL;  Surgeon: [name], MD;  Location: [facility] ENDOSCOPY;  Service: Gastroenterology;  Laterality: N/A;  . COLONOSCOPY WITH PROPOFOL N/A 08/12/2019   Procedure: COLONOSCOPY WITH PROPOFOL;  Surgeon: [name], MD;  Location: [facility] ENDOSCOPY;  Service: Gastroenterology;  Laterality: N/A;  . COLONOSCOPY WITH PROPOFOL N/A 09/19/2019   Procedure: COLONOSCOPY WITH PROPOFOL;  Surgeon: [name], MD;  Location:  [facility] ENDOSCOPY;  Service: Gastroenterology;  Laterality: N/A;  . DILATION AND CURETTAGE OF UTERUS    . ESOPHAGOGASTRODUODENOSCOPY (EGD) WITH PROPOFOL N/A 06/13/2019   Procedure: ESOPHAGOGASTRODUODENOSCOPY (EGD) WITH PROPOFOL;  Surgeon: [name], MD;  Location: [facility] ENDOSCOPY;  Service: Gastroenterology;  Laterality: N/A;  Pt will go for COVID test on 25-4-27 as she uses public transportation and will be in the area on that day.   . ESOPHAGOGASTRODUODENOSCOPY (EGD) WITH PROPOFOL N/A 10/31/2019   Procedure: ESOPHAGOGASTRODUODENOSCOPY (EGD) WITH PROPOFOL;  Surgeon: Rush Landmark Telford Nab., MD;  Location: [place];  Service: Gastroenterology;  Laterality: N/A;  . HEMOSTASIS CLIP PLACEMENT  10/31/2019   Procedure: HEMOSTASIS CLIP PLACEMENT;  Surgeon: Irving Copas., MD;  Location: [place];  Service: Gastroenterology;;  . HYSTEROSCOPY WITH D & C N/A 02/16/2017   Procedure: DILATATION AND CURETTAGE [place];  Surgeon: Brayton Mars, MD;  Location: [facility];  Service: Gynecology;  Laterality: N/A;  . JOINT REPLACEMENT    . kidney removal Left   . KIDNEY SURGERY Left 1979   left kidney removed  . LEFT HEART CATH AND CORONARY ANGIOGRAPHY N/A 10/21/2017   Procedure: LEFT HEART CATH AND CORONARY ANGIOGRAPHY;  Surgeon: Teodoro Spray, MD;  Location: [place] CV LAB;  Service: Cardiovascular;  Laterality: N/A;  . POLYPECTOMY  10/31/2019   Procedure: POLYPECTOMY;  Surgeon: Irving Copas., MD;  Location: [place];  Service: Gastroenterology;;  . TOTAL KNEE ARTHROPLASTY Left 07/06/2017   Procedure: TOTAL KNEE ARTHROPLASTY;  Surgeon: Lovell Sheehan, MD;  Location: [facility];  Service: Orthopedics;  Laterality: Left;  . UPPER ESOPHAGEAL ENDOSCOPIC ULTRASOUND (EUS) N/A 10/31/2019   Procedure: UPPER ESOPHAGEAL ENDOSCOPIC ULTRASOUND (EUS);  Surgeon: Irving Copas., MD;  Location: [place];  Service: Gastroenterology;  Laterality: N/A;  . WRIST ARTHROSCOPY  Left 1970s  . XI ROBOTIC ASSISTED VENTRAL HERNIA N/A 12/01/2019   Procedure: XI ROBOTIC ASSISTED VENTRAL HERNIA;  Surgeon: Jules Husbands, MD;  Location: [facility];  Service: General;  Laterality: N/A;    There were no vitals filed for this visit.   Subjective Assessment - 06/06/20 1447    Subjective Patient states she has been having increased right knee and leg pain from insidious onset starting the previous Monday.    Pertinent History s/p TKA 07/06/2017; right knee arthritis with weakness as well with reports of right and left leg "giving way" intermittently; Going to have TKA 6/10    Limitations Walking;Standing;House hold activities;Other (comment)    How long can you sit comfortably? No Limit    How long can you stand comfortably? 2-3 hours    How long can you walk comfortably? 2-3 hours    Diagnostic tests imaging - see scanned docs    Patient Stated Goals Improve motion and strength of  hips and R knee and being able to walk with less/no pain.    Currently in Pain? Yes    Pain Score 7     Pain Location Leg    Pain Orientation Right    Pain Descriptors / Indicators Aching    Pain Type Acute pain    Pain Onset More than a month ago    Pain Frequency Intermittent            Treatment  Therapeutic Exercise Standing lumbar extension with upper extremity support-X 20 Seated dorsiflexion against manual resistance 3-second holds-X20 Seated ankle eversion with dorsiflexion--X 20 Seated ankle eversion with plantarflexion--X 20 Seated Nerve glides with sciatic nerve -- x 20 Seated nerve slides with sciatic nerve -- x 20 Seated hip ER with therapist assistance -- x 20   Performed exercises to decrease spasms and pain along the right affected leg        PT Education - 06/06/20 1449    Education provided Yes    Education Details form/technique with exercise    Person(s) Educated Patient    Methods Explanation;Demonstration    Comprehension Returned  demonstration;Verbalized understanding            PT Short Term Goals - 04/04/20 1348      PT SHORT TERM GOAL #1   Title Patient will demonstrate independence with HEP to maximize rehab potential.    Time 3    Period Weeks    Status New    Target Date 04/25/20             PT Long Term Goals - 05/23/20 1455      PT LONG TERM GOAL #1   Title Patient will demonstrate independence with progressive HEP to maintain progress made during therapy.    Time 6    Period Weeks    Status On-going      PT LONG TERM GOAL #2   Title Patient will improve FOTO score to 58 to show an improvement in patients ability to perform functional activities.  Baseline 50 at Eval; 05/23/20: 55    Time 6    Period Weeks    Status On-going      PT LONG TERM GOAL #3   Title Patient will have a worst pain of 3/10 to indicate significant improvement with pain and ability to perform functional activities more functionally.     Baseline 10/10; 05/23/20: 5/10    Time 6    Period Weeks    Status On-going      PT LONG TERM GOAL #4   Title Patient will be able to descend stairs without pain to show improvement in quad strength and knee stability.    Baseline pain with descending stairs    Time 6    Period Weeks    Status Deferred      PT LONG TERM GOAL #5   Title Patient will improve 2MWT test by 54ft to show improvement in patients aerobic capacity and ability to ambulate out in community.    Baseline 450ft at eval.6    Time 6    Period Weeks    Status Deferred                 Plan - 06/06/20 1501    Clinical Impression Statement Patient with increased right leg pain with pain extending from the hip down to the back of the ankle indicating sciatic nerve origin.  Patient with decreased pain after performing nerve glides and activation of affected muscular tissue such as the peroneal, tibialis anterior, hip external rotators.  We will continue to address these issues as well as improve  strength throughout the session.  Patient pain started from a 7 out of 10 dropped to a 3 out of 10 at the end of the session.  Patient will benefit from further skilled therapy to return to prior level of function.    Personal Factors and Comorbidities Age;Comorbidity 2    Comorbidities HBP, Obesity    Examination-Activity Limitations Stairs;Locomotion Level;Stand    Examination-Participation Restrictions Shop    Stability/Clinical Decision Making Evolving/Moderate complexity    Rehab Potential Good    Clinical Impairments Affecting Rehab Potential (+) motivated, chronic condition (-) arthritis right knee with pain    PT Frequency 2x / week    PT Duration 6 weeks    PT Treatment/Interventions Electrical Stimulation;Cryotherapy;Moist Heat;Gait training;Stair training;Therapeutic activities;Therapeutic exercise;Balance training;Patient/family education;Neuromuscular re-education;Manual techniques;Passive range of motion;Dry needling;Spinal Manipulations;Joint Manipulations;Functional mobility training;Traction    PT Next Visit Plan Monitor BP throughout session and continue POC    PT Home Exercise Plan Heel Raises, standing hip abduction    Consulted and Agree with Plan of Care Patient           Patient will benefit from skilled therapeutic intervention in order to improve the following deficits and impairments:  Pain, Decreased activity tolerance, Decreased endurance, Decreased range of motion, Decreased strength, Impaired perceived functional ability, Difficulty walking, Decreased mobility, Obesity  Visit Diagnosis: Muscle weakness (generalized)  Chronic pain of right knee  Pain in left hip     Problem List Patient Active Problem List   Diagnosis Date Noted  . Acute diverticulitis 05/13/2020  . Fever blister 10/15/2019  . Cerumen in auditory canal on examination 10/15/2019  . Bilateral hearing loss 10/15/2019  . Need for vaccination against Streptococcus pneumoniae using  pneumococcal conjugate vaccine 13 10/15/2019  . Encounter for screening mammogram for malignant neoplasm of breast 10/15/2019  . Screening for osteoporosis 10/15/2019  . Elevated erythrocyte sedimentation rate 11/02/2018  .  Lumbar spondylosis 11/02/2018  . PVC's (premature ventricular contractions) 10/18/2018  . Primary osteoarthritis of right knee 09/15/2018  . Encounter for pre-operative examination 09/15/2018  . Obstructive sleep apnea 09/15/2018  . Calculus of gallbladder without cholecystitis without obstruction 09/15/2018  . Calculus of gallbladder with cholecystitis without biliary obstruction 01/27/2018  . Restless leg syndrome 01/27/2018  . Generalized abdominal pain 01/11/2018  . Cyst of pancreas 01/11/2018  . Other specified disorders of kidney and ureter 01/11/2018  . Primary insomnia 01/11/2018  . Paroxysmal atrial fibrillation ([place]) 11/12/2017  . Essential hypertension 10/25/2017  . Mixed hyperlipidemia 10/25/2017  . Low back pain at multiple sites 10/25/2017  . Dysuria 10/25/2017  . Encounter for general adult medical examination with abnormal findings 10/25/2017  . Vitamin D deficiency 10/25/2017  . Near syncope   . Symptomatic bradycardia 10/19/2017  . Carpal tunnel syndrome 10/12/2017  . Primary osteoarthritis of left knee 07/09/2017  . History of total knee arthroplasty 07/06/2017  . Chest pain 06/29/2017  . Postop check 02/25/2017  . Impingement syndrome of shoulder region 01/20/2017  . Neck pain 01/20/2017  . Obesity (BMI 35.0-39.9 without comorbidity) 07/15/2016  . Endometrial polyp 07/15/2016  . Morbid obesity ([place]) 07/15/2016  . Family history of breast cancer in first degree relative 07/15/2016  . Family history of ovarian cancer 07/15/2016  . Knee pain 07/04/2016  . Bilateral leg pain 06/24/2016    Blythe Stanford, PT DPT 06/06/2020, 3:05 PM  [facility] [address],  [place], [address] Phone: [phone]   Fax:  [phone]  Name: Whitney [name] MRN: [id number] Date of Birth: Sep 24, 1950

## 2020-06-08 ENCOUNTER — Ambulatory Visit
Admission: RE | Admit: 2020-06-08 | Discharge: 2020-06-08 | Disposition: A | Discharge status: Home / Self Care | Payer: Medicare Other | Source: Ambulatory Visit | Attending: Nurse Practitioner | Admitting: Nurse Practitioner

## 2020-06-08 ENCOUNTER — Other Ambulatory Visit: Payer: Self-pay

## 2020-06-08 DIAGNOSIS — Z1231 Encounter for screening mammogram for malignant neoplasm of breast: Secondary | ICD-10-CM | POA: Diagnosis not present

## 2020-06-08 DIAGNOSIS — N6489 Other specified disorders of breast: Secondary | ICD-10-CM | POA: Diagnosis not present

## 2020-06-12 DIAGNOSIS — M1711 Unilateral primary osteoarthritis, right knee: Secondary | ICD-10-CM | POA: Diagnosis not present

## 2020-06-13 ENCOUNTER — Other Ambulatory Visit: Payer: Self-pay

## 2020-06-13 ENCOUNTER — Ambulatory Visit: Payer: Medicare Other

## 2020-06-13 DIAGNOSIS — M6281 Muscle weakness (generalized): Secondary | ICD-10-CM

## 2020-06-13 DIAGNOSIS — M25561 Pain in right knee: Secondary | ICD-10-CM | POA: Diagnosis not present

## 2020-06-13 DIAGNOSIS — G8929 Other chronic pain: Secondary | ICD-10-CM | POA: Diagnosis not present

## 2020-06-13 DIAGNOSIS — M25552 Pain in left hip: Secondary | ICD-10-CM

## 2020-06-13 NOTE — Therapy (Signed)
[facility] [address] [place], [address] Phone: [phone]   Fax:  [phone]  Physical Therapy Treatment  Patient Details  Name: Whitney [name] MRN: [id number] Date of Birth: 03-09-1951 Referring Provider (PT): Kurtis Bushman MD   Encounter Date: 06/13/2020   PT End of Session - 06/13/20 1036    Visit Number 12    Number of Visits 21    Date for PT Re-Evaluation 05/30/20    Authorization Type 2/10    PT Start Time 1030    PT Stop Time 1115    PT Time Calculation (min) 45 min    Activity Tolerance Patient tolerated treatment well;Patient limited by pain    Behavior During Therapy [facility] for tasks assessed/performed           Past Medical History:  Diagnosis Date  . Abnormal Pap smear of cervix    Pt states she had colposcopy   . Anxiety   . Arthritis   . Chronic kidney disease    removed left kidney  . Depression   . Dyspnea   . Dysrhythmia   . Hx of unilateral nephrectomy 1979   Left Nephrectomy  . Hypertension   . Lower extremity edema   . Palpitations   . PMB (postmenopausal bleeding)   . Restless leg syndrome   . Sleep apnea    has no cpap  . Vitamin D deficiency     Past Surgical History:  Procedure Laterality Date  . BIOPSY  10/31/2019   Procedure: BIOPSY;  Surgeon: Rush Landmark [place] Nab., MD;  Location: [place];  Service: Gastroenterology;;  . CARDIAC CATHETERIZATION    . CHOLECYSTECTOMY    . COLONOSCOPY WITH PROPOFOL N/A 06/13/2019   Procedure: COLONOSCOPY WITH PROPOFOL;  Surgeon: [name], MD;  Location: [facility] ENDOSCOPY;  Service: Gastroenterology;  Laterality: N/A;  . COLONOSCOPY WITH PROPOFOL N/A 08/12/2019   Procedure: COLONOSCOPY WITH PROPOFOL;  Surgeon: [name], MD;  Location: [facility] ENDOSCOPY;  Service: Gastroenterology;  Laterality: N/A;  . COLONOSCOPY WITH PROPOFOL N/A 09/19/2019   Procedure: COLONOSCOPY WITH PROPOFOL;  Surgeon: [name], MD;  Location:  [facility] ENDOSCOPY;  Service: Gastroenterology;  Laterality: N/A;  . DILATION AND CURETTAGE OF UTERUS    . ESOPHAGOGASTRODUODENOSCOPY (EGD) WITH PROPOFOL N/A 06/13/2019   Procedure: ESOPHAGOGASTRODUODENOSCOPY (EGD) WITH PROPOFOL;  Surgeon: [name], MD;  Location: [facility] ENDOSCOPY;  Service: Gastroenterology;  Laterality: N/A;  Pt will go for COVID test on 93-8-18 as she uses public transportation and will be in the area on that day.   . ESOPHAGOGASTRODUODENOSCOPY (EGD) WITH PROPOFOL N/A 10/31/2019   Procedure: ESOPHAGOGASTRODUODENOSCOPY (EGD) WITH PROPOFOL;  Surgeon: Rush Landmark [place] Nab., MD;  Location: [place];  Service: Gastroenterology;  Laterality: N/A;  . HEMOSTASIS CLIP PLACEMENT  10/31/2019   Procedure: HEMOSTASIS CLIP PLACEMENT;  Surgeon: Irving Copas., MD;  Location: [place];  Service: Gastroenterology;;  . HYSTEROSCOPY WITH D & C N/A 02/16/2017   Procedure: DILATATION AND CURETTAGE [place];  Surgeon: Brayton Mars, MD;  Location: [facility];  Service: Gynecology;  Laterality: N/A;  . JOINT REPLACEMENT    . kidney removal Left   . KIDNEY SURGERY Left 1979   left kidney removed  . LEFT HEART CATH AND CORONARY ANGIOGRAPHY N/A 10/21/2017   Procedure: LEFT HEART CATH AND CORONARY ANGIOGRAPHY;  Surgeon: Teodoro Spray, MD;  Location: [place] CV LAB;  Service: Cardiovascular;  Laterality: N/A;  . POLYPECTOMY  10/31/2019   Procedure: POLYPECTOMY;  Surgeon: Irving Copas., MD;  Location: [place];  Service: Gastroenterology;;  . TOTAL KNEE ARTHROPLASTY Left 07/06/2017   Procedure: TOTAL KNEE ARTHROPLASTY;  Surgeon: Lovell Sheehan, MD;  Location: [facility];  Service: Orthopedics;  Laterality: Left;  . UPPER ESOPHAGEAL ENDOSCOPIC ULTRASOUND (EUS) N/A 10/31/2019   Procedure: UPPER ESOPHAGEAL ENDOSCOPIC ULTRASOUND (EUS);  Surgeon: Irving Copas., MD;  Location: [place];  Service: Gastroenterology;  Laterality: N/A;  . WRIST ARTHROSCOPY  Left 1970s  . XI ROBOTIC ASSISTED VENTRAL HERNIA N/A 12/01/2019   Procedure: XI ROBOTIC ASSISTED VENTRAL HERNIA;  Surgeon: Jules Husbands, MD;  Location: [facility];  Service: General;  Laterality: N/A;    There were no vitals filed for this visit.   Subjective Assessment - 06/13/20 1035    Subjective Patient reports her R knee is feeling better after receiving an injection yesterday.    Pertinent History s/p TKA 07/06/2017; right knee arthritis with weakness as well with reports of right and left leg "giving way" intermittently; Going to have TKA 6/10    Limitations Walking;Standing;House hold activities;Other (comment)    How long can you sit comfortably? No Limit    How long can you stand comfortably? 2-3 hours    How long can you walk comfortably? 2-3 hours    Diagnostic tests imaging - see scanned docs    Patient Stated Goals Improve motion and strength of  hips and R knee and being able to walk with less/no pain.    Currently in Pain? No/denies    Pain Onset More than a month ago           TREATMENT Therapeutic Exercise Total Gym Squat Level 23 x15,25/ Level 26 x15  Deadlifts in standing - 2 x 10; 30# KB Knee Extension at [place] - 3 x 15 20# Leg Curl at [place] - 3x15 25# Heel Raises 2x10 Performed exercises to improve LE strength   PT Education - 06/13/20 1035    Education provided Yes    Education Details Form/Technique with Exercise    Person(s) Educated Patient    Methods Explanation;Demonstration    Comprehension Verbalized understanding;Returned demonstration            PT Short Term Goals - 04/04/20 1348      PT SHORT TERM GOAL #1   Title Patient will demonstrate independence with HEP to maximize rehab potential.    Time 3    Period Weeks    Status New    Target Date 04/25/20             PT Long Term Goals - 05/23/20 1455      PT LONG TERM GOAL #1   Title Patient will demonstrate independence with progressive HEP to maintain progress made during  therapy.    Time 6    Period Weeks    Status On-going      PT LONG TERM GOAL #2   Title Patient will improve FOTO score to 58 to show an improvement in patients ability to perform functional activities.    Baseline 50 at Eval; 05/23/20: 55    Time 6    Period Weeks    Status On-going      PT LONG TERM GOAL #3   Title Patient will have a worst pain of 3/10 to indicate significant improvement with pain and ability to perform functional activities more functionally.     Baseline 10/10; 05/23/20: 5/10    Time 6    Period Weeks  Status On-going      PT LONG TERM GOAL #4   Title Patient will be able to descend stairs without pain to show improvement in quad strength and knee stability.    Baseline pain with descending stairs    Time 6    Period Weeks    Status Deferred      PT LONG TERM GOAL #5   Title Patient will improve 2MWT test by 57ft to show improvement in patients aerobic capacity and ability to ambulate out in community.    Baseline 483ft at eval.6    Time 6    Period Weeks    Status Deferred                 Plan - 06/13/20 1055    Clinical Impression Statement Patients BP was within appropriate range to perform exercises.  Continued focusing on improving LE strength at which patient tolerated well.  Patient had moderate muscular fatigue at end of session and required moderate rest breaks due to SOB.  Patient will continue to benefit from skilled therapy to return to prior level of function.    Personal Factors and Comorbidities Age;Comorbidity 2    Comorbidities HBP, Obesity    Examination-Activity Limitations Stairs;Locomotion Level;Stand    Examination-Participation Restrictions Shop    Stability/Clinical Decision Making Evolving/Moderate complexity    Clinical Decision Making Moderate    Rehab Potential Good    Clinical Impairments Affecting Rehab Potential (+) motivated, chronic condition (-) arthritis right knee with pain    PT Frequency 2x / week    PT  Duration 6 weeks    PT Treatment/Interventions Electrical Stimulation;Cryotherapy;Moist Heat;Gait training;Stair training;Therapeutic activities;Therapeutic exercise;Balance training;Patient/family education;Neuromuscular re-education;Manual techniques;Passive range of motion;Dry needling;Spinal Manipulations;Joint Manipulations;Functional mobility training;Traction    PT Next Visit Plan Monitor BP throughout session and continue POC    PT Home Exercise Plan Heel Raises, standing hip abduction    Consulted and Agree with Plan of Care Patient           Patient will benefit from skilled therapeutic intervention in order to improve the following deficits and impairments:  Pain, Decreased activity tolerance, Decreased endurance, Decreased range of motion, Decreased strength, Impaired perceived functional ability, Difficulty walking, Decreased mobility, Obesity  Visit Diagnosis: Muscle weakness (generalized)  Chronic pain of right knee  Pain in left hip     Problem List Patient Active Problem List   Diagnosis Date Noted  . Acute diverticulitis 05/13/2020  . Fever blister 10/15/2019  . Cerumen in auditory canal on examination 10/15/2019  . Bilateral hearing loss 10/15/2019  . Need for vaccination against Streptococcus pneumoniae using pneumococcal conjugate vaccine 13 10/15/2019  . Encounter for screening mammogram for malignant neoplasm of breast 10/15/2019  . Screening for osteoporosis 10/15/2019  . Elevated erythrocyte sedimentation rate 11/02/2018  . Lumbar spondylosis 11/02/2018  . PVC's (premature ventricular contractions) 10/18/2018  . Primary osteoarthritis of right knee 09/15/2018  . Encounter for pre-operative examination 09/15/2018  . Obstructive sleep apnea 09/15/2018  . Calculus of gallbladder without cholecystitis without obstruction 09/15/2018  . Calculus of gallbladder with cholecystitis without biliary obstruction 01/27/2018  . Restless leg syndrome 01/27/2018  .  Generalized abdominal pain 01/11/2018  . Cyst of pancreas 01/11/2018  . Other specified disorders of kidney and ureter 01/11/2018  . Primary insomnia 01/11/2018  . Paroxysmal atrial fibrillation ([place]) 11/12/2017  . Essential hypertension 10/25/2017  . Mixed hyperlipidemia 10/25/2017  . Low back pain at multiple sites 10/25/2017  . Dysuria 10/25/2017  .  Encounter for general adult medical examination with abnormal findings 10/25/2017  . Vitamin D deficiency 10/25/2017  . Near syncope   . Symptomatic bradycardia 10/19/2017  . Carpal tunnel syndrome 10/12/2017  . Primary osteoarthritis of left knee 07/09/2017  . History of total knee arthroplasty 07/06/2017  . Chest pain 06/29/2017  . Postop check 02/25/2017  . Impingement syndrome of shoulder region 01/20/2017  . Neck pain 01/20/2017  . Obesity (BMI 35.0-39.9 without comorbidity) 07/15/2016  . Endometrial polyp 07/15/2016  . Morbid obesity ([place]) 07/15/2016  . Family history of breast cancer in first degree relative 07/15/2016  . Family history of ovarian cancer 07/15/2016  . Knee pain 07/04/2016  . Bilateral leg pain 06/24/2016   11:13 AM, 06/13/20 Margarito Liner, SPT Student Physical Therapist [place]  [phone]  Margarito Liner 06/13/2020, 10:56 AM  [facility] [address] [place], [address] Phone: [phone]   Fax:  [phone]  Name: Whitney [name] MRN: [id number] Date of Birth: November 16, 1950

## 2020-06-15 DIAGNOSIS — M48062 Spinal stenosis, lumbar region with neurogenic claudication: Secondary | ICD-10-CM | POA: Diagnosis not present

## 2020-06-15 DIAGNOSIS — M5136 Other intervertebral disc degeneration, lumbar region: Secondary | ICD-10-CM | POA: Diagnosis not present

## 2020-06-15 DIAGNOSIS — M5416 Radiculopathy, lumbar region: Secondary | ICD-10-CM | POA: Diagnosis not present

## 2020-06-20 ENCOUNTER — Ambulatory Visit: Payer: Medicare Other

## 2020-06-20 ENCOUNTER — Other Ambulatory Visit: Payer: Self-pay

## 2020-06-20 DIAGNOSIS — M25561 Pain in right knee: Secondary | ICD-10-CM | POA: Diagnosis not present

## 2020-06-20 DIAGNOSIS — M6281 Muscle weakness (generalized): Secondary | ICD-10-CM | POA: Diagnosis not present

## 2020-06-20 DIAGNOSIS — G8929 Other chronic pain: Secondary | ICD-10-CM | POA: Diagnosis not present

## 2020-06-20 DIAGNOSIS — M25552 Pain in left hip: Secondary | ICD-10-CM

## 2020-06-20 NOTE — Therapy (Signed)
[place] [facility] [address] [place], [address] Phone: [phone]   Fax:  [phone]  Physical Therapy Treatment  Patient Details  Name: Whitney [name] MRN: [id number] Date of Birth: [birth year] Referring Provider (PT): Kurtis Bushman MD   Encounter Date: 06/20/2020   PT End of Session - 06/20/20 1304    Visit Number 13    Number of Visits 21    Authorization Type 3/10    PT Start Time 1300    PT Stop Time 1345    PT Time Calculation (min) 45 min    Activity Tolerance Patient tolerated treatment well;Patient limited by pain    Behavior During Therapy [facility] for tasks assessed/performed           Past Medical History:  Diagnosis Date  . Abnormal Pap smear of cervix    Pt states she had colposcopy   . Anxiety   . Arthritis   . Chronic kidney disease    removed left kidney  . Depression   . Dyspnea   . Dysrhythmia   . Hx of unilateral nephrectomy 1979   Left Nephrectomy  . Hypertension   . Lower extremity edema   . Palpitations   . PMB (postmenopausal bleeding)   . Restless leg syndrome   . Sleep apnea    has no cpap  . Vitamin D deficiency     Past Surgical History:  Procedure Laterality Date  . BIOPSY  10/31/2019   Procedure: BIOPSY;  Surgeon: Rush Landmark Telford Nab., MD;  Location: [place];  Service: Gastroenterology;;  . CARDIAC CATHETERIZATION    . CHOLECYSTECTOMY    . COLONOSCOPY WITH PROPOFOL N/A 06/13/2019   Procedure: COLONOSCOPY WITH PROPOFOL;  Surgeon: [name], MD;  Location: [facility] ENDOSCOPY;  Service: Gastroenterology;  Laterality: N/A;  . COLONOSCOPY WITH PROPOFOL N/A 08/12/2019   Procedure: COLONOSCOPY WITH PROPOFOL;  Surgeon: [name], MD;  Location: [facility] ENDOSCOPY;  Service: Gastroenterology;  Laterality: N/A;  . COLONOSCOPY WITH PROPOFOL N/A 09/19/2019   Procedure: COLONOSCOPY WITH PROPOFOL;  Surgeon: [name], MD;  Location: [facility] ENDOSCOPY;  Service:  Gastroenterology;  Laterality: N/A;  . DILATION AND CURETTAGE OF UTERUS    . ESOPHAGOGASTRODUODENOSCOPY (EGD) WITH PROPOFOL N/A 06/13/2019   Procedure: ESOPHAGOGASTRODUODENOSCOPY (EGD) WITH PROPOFOL;  Surgeon: [name], MD;  Location: [facility] ENDOSCOPY;  Service: Gastroenterology;  Laterality: N/A;  Pt will go for COVID test on 69-6-29 as she uses public transportation and will be in the area on that day.   . ESOPHAGOGASTRODUODENOSCOPY (EGD) WITH PROPOFOL N/A 10/31/2019   Procedure: ESOPHAGOGASTRODUODENOSCOPY (EGD) WITH PROPOFOL;  Surgeon: Rush Landmark Telford Nab., MD;  Location: [place];  Service: Gastroenterology;  Laterality: N/A;  . HEMOSTASIS CLIP PLACEMENT  10/31/2019   Procedure: HEMOSTASIS CLIP PLACEMENT;  Surgeon: Irving Copas., MD;  Location: [place];  Service: Gastroenterology;;  . HYSTEROSCOPY WITH D & C N/A 02/16/2017   Procedure: DILATATION AND CURETTAGE [place] Glen;  Surgeon: Brayton Mars, MD;  Location: [facility] ORS;  Service: Gynecology;  Laterality: N/A;  . JOINT REPLACEMENT    . kidney removal Left   . KIDNEY SURGERY Left 1979   left kidney removed  . LEFT HEART CATH AND CORONARY ANGIOGRAPHY N/A 10/21/2017   Procedure: LEFT HEART CATH AND CORONARY ANGIOGRAPHY;  Surgeon: Teodoro Spray, MD;  Location: [place] CV LAB;  Service: Cardiovascular;  Laterality: N/A;  . POLYPECTOMY  10/31/2019   Procedure: POLYPECTOMY;  Surgeon: Rush Landmark Telford Nab., MD;  Location: [facility]  ENDOSCOPY;  Service: Gastroenterology;;  . TOTAL KNEE ARTHROPLASTY Left 07/06/2017   Procedure: TOTAL KNEE ARTHROPLASTY;  Surgeon: Lovell Sheehan, MD;  Location: [facility] ORS;  Service: Orthopedics;  Laterality: Left;  . UPPER ESOPHAGEAL ENDOSCOPIC ULTRASOUND (EUS) N/A 10/31/2019   Procedure: UPPER ESOPHAGEAL ENDOSCOPIC ULTRASOUND (EUS);  Surgeon: Irving Copas., MD;  Location: [place];  Service: Gastroenterology;  Laterality: N/A;  . WRIST ARTHROSCOPY Left 1970s  . XI ROBOTIC  ASSISTED VENTRAL HERNIA N/A 12/01/2019   Procedure: XI ROBOTIC ASSISTED VENTRAL HERNIA;  Surgeon: Jules Husbands, MD;  Location: [facility] ORS;  Service: General;  Laterality: N/A;    There were no vitals filed for this visit.   Subjective Assessment - 06/20/20 1303    Subjective Patient reports everything is feeling good and has no other issues to report.    Pertinent History s/p TKA 07/06/2017; right knee arthritis with weakness as well with reports of right and left leg "giving way" intermittently; Going to have TKA 6/10    Limitations Walking;Standing;House hold activities;Other (comment)    How long can you sit comfortably? No Limit    How long can you stand comfortably? 2-3 hours    How long can you walk comfortably? 2-3 hours    Diagnostic tests imaging - see scanned docs    Patient Stated Goals Improve motion and strength of  hips and R knee and being able to walk with less/no pain.    Currently in Pain? No/denies    Pain Onset More than a month ago            TREATMENT Therapeutic Exercise Total Gym Squat Level 23 x15/ Level 26 2x15  Deadlifts in standing - 2 x 10; 30# KB Knee Extension at [place] - 3 x 10 35# Leg Curl at [facility] - 3x15 25# Heel Raises 2x10 Performed exercises to improve LE strength        PT Education - 06/20/20 1303    Education provided Yes    Education Details Form?Technique with Exercise    Person(s) Educated Patient    Methods Explanation;Demonstration    Comprehension Verbalized understanding;Returned demonstration            PT Short Term Goals - 04/04/20 1348      PT SHORT TERM GOAL #1   Title Patient will demonstrate independence with HEP to maximize rehab potential.    Time 3    Period Weeks    Status New    Target Date 04/25/20             PT Long Term Goals - 05/23/20 1455      PT LONG TERM GOAL #1   Title Patient will demonstrate independence with progressive HEP to maintain progress made during therapy.    Time 6     Period Weeks    Status On-going      PT LONG TERM GOAL #2   Title Patient will improve FOTO score to 58 to show an improvement in patients ability to perform functional activities.    Baseline 50 at Eval; 05/23/20: 55    Time 6    Period Weeks    Status On-going      PT LONG TERM GOAL #3   Title Patient will have a worst pain of 3/10 to indicate significant improvement with pain and ability to perform functional activities more functionally.     Baseline 10/10; 05/23/20: 5/10    Time 6    Period Weeks    Status  On-going      PT LONG TERM GOAL #4   Title Patient will be able to descend stairs without pain to show improvement in quad strength and knee stability.    Baseline pain with descending stairs    Time 6    Period Weeks    Status Deferred      PT LONG TERM GOAL #5   Title Patient will improve 2MWT test by 84ft to show improvement in patients aerobic capacity and ability to ambulate out in community.    Baseline 456ft at eval.6    Time 6    Period Weeks    Status Deferred                 Plan - 06/20/20 1342    Clinical Impression Statement Patients BP was WNL to perform exercise at beginning of session and throughout.  Continued with strengthening LE; patient tolerated exercises well with moderate muscular fatigue and SOB. Required verbal cueing to initiate more hip motion during deadlift.  Patient will benefit from skilled therapy to return to prior level of function.    Personal Factors and Comorbidities Age;Comorbidity 2    Comorbidities HBP, Obesity    Examination-Activity Limitations Stairs;Locomotion Level;Stand    Examination-Participation Restrictions Shop    Stability/Clinical Decision Making Evolving/Moderate complexity    Clinical Decision Making Moderate    Rehab Potential Good    Clinical Impairments Affecting Rehab Potential (+) motivated, chronic condition (-) arthritis right knee with pain    PT Frequency 2x / week    PT Duration 6 weeks    PT  Treatment/Interventions Electrical Stimulation;Cryotherapy;Moist Heat;Gait training;Stair training;Therapeutic activities;Therapeutic exercise;Balance training;Patient/family education;Neuromuscular re-education;Manual techniques;Passive range of motion;Dry needling;Spinal Manipulations;Joint Manipulations;Functional mobility training;Traction    PT Next Visit Plan Monitor BP throughout session and continue POC    PT Home Exercise Plan Heel Raises, standing hip abduction    Consulted and Agree with Plan of Care Patient           Patient will benefit from skilled therapeutic intervention in order to improve the following deficits and impairments:  Pain, Decreased activity tolerance, Decreased endurance, Decreased range of motion, Decreased strength, Impaired perceived functional ability, Difficulty walking, Decreased mobility, Obesity  Visit Diagnosis: Muscle weakness (generalized)  Chronic pain of right knee  Pain in left hip     Problem List Patient Active Problem List   Diagnosis Date Noted  . Acute diverticulitis 05/13/2020  . Fever blister 10/15/2019  . Cerumen in auditory canal on examination 10/15/2019  . Bilateral hearing loss 10/15/2019  . Need for vaccination against Streptococcus pneumoniae using pneumococcal conjugate vaccine 13 10/15/2019  . Encounter for screening mammogram for malignant neoplasm of breast 10/15/2019  . Screening for osteoporosis 10/15/2019  . Elevated erythrocyte sedimentation rate 11/02/2018  . Lumbar spondylosis 11/02/2018  . PVC's (premature ventricular contractions) 10/18/2018  . Primary osteoarthritis of right knee 09/15/2018  . Encounter for pre-operative examination 09/15/2018  . Obstructive sleep apnea 09/15/2018  . Calculus of gallbladder without cholecystitis without obstruction 09/15/2018  . Calculus of gallbladder with cholecystitis without biliary obstruction 01/27/2018  . Restless leg syndrome 01/27/2018  . Generalized abdominal  pain 01/11/2018  . Cyst of pancreas 01/11/2018  . Other specified disorders of kidney and ureter 01/11/2018  . Primary insomnia 01/11/2018  . Paroxysmal atrial fibrillation ([place]) 11/12/2017  . Essential hypertension 10/25/2017  . Mixed hyperlipidemia 10/25/2017  . Low back pain at multiple sites 10/25/2017  . Dysuria 10/25/2017  . Encounter for general  adult medical examination with abnormal findings 10/25/2017  . Vitamin D deficiency 10/25/2017  . Near syncope   . Symptomatic bradycardia 10/19/2017  . Carpal tunnel syndrome 10/12/2017  . Primary osteoarthritis of left knee 07/09/2017  . History of total knee arthroplasty 07/06/2017  . Chest pain 06/29/2017  . Postop check 02/25/2017  . Impingement syndrome of shoulder region 01/20/2017  . Neck pain 01/20/2017  . Obesity (BMI 35.0-39.9 without comorbidity) 07/15/2016  . Endometrial polyp 07/15/2016  . Morbid obesity ([place]) 07/15/2016  . Family history of breast cancer in first degree relative 07/15/2016  . Family history of ovarian cancer 07/15/2016  . Knee pain 07/04/2016  . Bilateral leg pain 06/24/2016   1:44 PM, 06/20/20 Margarito Liner, SPT Student Physical Therapist [place]  [phone]  Margarito Liner 06/20/2020, 1:43 PM  [place] [facility] [address], [place], [address] Phone: [phone]   Fax:  [phone]  Name: Whitney [name] MRN: [id number] Date of Birth: 1951-06-21

## 2020-06-26 ENCOUNTER — Ambulatory Visit: Payer: Medicare Other

## 2020-06-26 ENCOUNTER — Other Ambulatory Visit: Payer: Self-pay

## 2020-06-26 ENCOUNTER — Emergency Department: Payer: Medicare Other

## 2020-06-26 DIAGNOSIS — M25552 Pain in left hip: Secondary | ICD-10-CM

## 2020-06-26 DIAGNOSIS — R079 Chest pain, unspecified: Secondary | ICD-10-CM | POA: Insufficient documentation

## 2020-06-26 DIAGNOSIS — Z20822 Contact with and (suspected) exposure to covid-19: Secondary | ICD-10-CM | POA: Insufficient documentation

## 2020-06-26 DIAGNOSIS — G8929 Other chronic pain: Secondary | ICD-10-CM

## 2020-06-26 DIAGNOSIS — I129 Hypertensive chronic kidney disease with stage 1 through stage 4 chronic kidney disease, or unspecified chronic kidney disease: Secondary | ICD-10-CM | POA: Diagnosis not present

## 2020-06-26 DIAGNOSIS — R059 Cough, unspecified: Secondary | ICD-10-CM | POA: Diagnosis not present

## 2020-06-26 DIAGNOSIS — Z79899 Other long term (current) drug therapy: Secondary | ICD-10-CM | POA: Insufficient documentation

## 2020-06-26 DIAGNOSIS — Z96652 Presence of left artificial knee joint: Secondary | ICD-10-CM | POA: Insufficient documentation

## 2020-06-26 DIAGNOSIS — M6281 Muscle weakness (generalized): Secondary | ICD-10-CM | POA: Diagnosis not present

## 2020-06-26 DIAGNOSIS — M7989 Other specified soft tissue disorders: Secondary | ICD-10-CM | POA: Diagnosis not present

## 2020-06-26 DIAGNOSIS — R600 Localized edema: Secondary | ICD-10-CM | POA: Diagnosis not present

## 2020-06-26 DIAGNOSIS — M25561 Pain in right knee: Secondary | ICD-10-CM

## 2020-06-26 DIAGNOSIS — N189 Chronic kidney disease, unspecified: Secondary | ICD-10-CM | POA: Insufficient documentation

## 2020-06-26 DIAGNOSIS — R109 Unspecified abdominal pain: Secondary | ICD-10-CM | POA: Diagnosis not present

## 2020-06-26 DIAGNOSIS — M7121 Synovial cyst of popliteal space [Baker], right knee: Secondary | ICD-10-CM | POA: Diagnosis not present

## 2020-06-26 DIAGNOSIS — Z7982 Long term (current) use of aspirin: Secondary | ICD-10-CM | POA: Insufficient documentation

## 2020-06-26 DIAGNOSIS — R0609 Other forms of dyspnea: Secondary | ICD-10-CM | POA: Diagnosis not present

## 2020-06-26 DIAGNOSIS — Z743 Need for continuous supervision: Secondary | ICD-10-CM | POA: Diagnosis not present

## 2020-06-26 DIAGNOSIS — R0602 Shortness of breath: Secondary | ICD-10-CM | POA: Diagnosis not present

## 2020-06-26 DIAGNOSIS — R6889 Other general symptoms and signs: Secondary | ICD-10-CM | POA: Diagnosis not present

## 2020-06-26 LAB — CBC
HCT: 36.1 % (ref 36.0–46.0)
Hemoglobin: 11.4 g/dL — ABNORMAL LOW (ref 12.0–15.0)
MCH: 29.3 pg (ref 26.0–34.0)
MCHC: 31.6 g/dL (ref 30.0–36.0)
MCV: 92.8 fL (ref 80.0–100.0)
Platelets: 270 10*3/uL (ref 150–400)
RBC: 3.89 MIL/uL (ref 3.87–5.11)
RDW: 13.3 % (ref 11.5–15.5)
WBC: 6.3 10*3/uL (ref 4.0–10.5)
nRBC: 0 % (ref 0.0–0.2)

## 2020-06-26 LAB — LIPASE, BLOOD: Lipase: 37 U/L (ref 11–51)

## 2020-06-26 LAB — COMPREHENSIVE METABOLIC PANEL
ALT: 12 U/L (ref 0–44)
AST: 15 U/L (ref 15–41)
Albumin: 3.3 g/dL — ABNORMAL LOW (ref 3.5–5.0)
Alkaline Phosphatase: 69 U/L (ref 38–126)
Anion gap: 9 (ref 5–15)
BUN: 16 mg/dL (ref 8–23)
CO2: 26 mmol/L (ref 22–32)
Calcium: 8.4 mg/dL — ABNORMAL LOW (ref 8.9–10.3)
Chloride: 105 mmol/L (ref 98–111)
Creatinine, Ser: 1.12 mg/dL — ABNORMAL HIGH (ref 0.44–1.00)
GFR, Estimated: 53 mL/min — ABNORMAL LOW (ref >60)
Glucose, Bld: 105 mg/dL — ABNORMAL HIGH (ref 70–99)
Potassium: 4.1 mmol/L (ref 3.5–5.1)
Sodium: 140 mmol/L (ref 135–145)
Total Bilirubin: 0.6 mg/dL (ref 0.3–1.2)
Total Protein: 6.8 g/dL (ref 6.5–8.1)

## 2020-06-26 NOTE — Therapy (Signed)
[place] [facility] [address], [place], [address] Phone: [phone]   Fax:  [phone]  Physical Therapy Treatment  Patient Details  Name: Whitney [name] MRN: [id number] Date of Birth: 1950-10-19 Referring Provider (PT): Kurtis Bushman MD   Encounter Date: 06/26/2020   PT End of Session - 06/26/20 0919    Visit Number 14    Number of Visits 21    Date for PT Re-Evaluation 07/04/20    Authorization Type 4/10    PT Start Time 0904    PT Stop Time 0945    PT Time Calculation (min) 41 min    Activity Tolerance Patient tolerated treatment well;Patient limited by pain    Behavior During Therapy [facility] for tasks assessed/performed           Past Medical History:  Diagnosis Date  . Abnormal Pap smear of cervix    Pt states she had colposcopy   . Anxiety   . Arthritis   . Chronic kidney disease    removed left kidney  . Depression   . Dyspnea   . Dysrhythmia   . Hx of unilateral nephrectomy 1979   Left Nephrectomy  . Hypertension   . Lower extremity edema   . Palpitations   . PMB (postmenopausal bleeding)   . Restless leg syndrome   . Sleep apnea    has no cpap  . Vitamin D deficiency     Past Surgical History:  Procedure Laterality Date  . BIOPSY  10/31/2019   Procedure: BIOPSY;  Surgeon: Rush Landmark Telford Nab., MD;  Location: [place];  Service: Gastroenterology;;  . CARDIAC CATHETERIZATION    . CHOLECYSTECTOMY    . COLONOSCOPY WITH PROPOFOL N/A 06/13/2019   Procedure: COLONOSCOPY WITH PROPOFOL;  Surgeon: [name], MD;  Location: [facility] ENDOSCOPY;  Service: Gastroenterology;  Laterality: N/A;  . COLONOSCOPY WITH PROPOFOL N/A 08/12/2019   Procedure: COLONOSCOPY WITH PROPOFOL;  Surgeon: [name], MD;  Location: [facility] ENDOSCOPY;  Service: Gastroenterology;  Laterality: N/A;  . COLONOSCOPY WITH PROPOFOL N/A 09/19/2019   Procedure: COLONOSCOPY WITH PROPOFOL;  Surgeon: [name], MD;  Location:  [facility] ENDOSCOPY;  Service: Gastroenterology;  Laterality: N/A;  . DILATION AND CURETTAGE OF UTERUS    . ESOPHAGOGASTRODUODENOSCOPY (EGD) WITH PROPOFOL N/A 06/13/2019   Procedure: ESOPHAGOGASTRODUODENOSCOPY (EGD) WITH PROPOFOL;  Surgeon: [name], MD;  Location: [facility] ENDOSCOPY;  Service: Gastroenterology;  Laterality: N/A;  Pt will go for COVID test on 74-0-81 as she uses public transportation and will be in the area on that day.   . ESOPHAGOGASTRODUODENOSCOPY (EGD) WITH PROPOFOL N/A 10/31/2019   Procedure: ESOPHAGOGASTRODUODENOSCOPY (EGD) WITH PROPOFOL;  Surgeon: Rush Landmark Telford Nab., MD;  Location: [place];  Service: Gastroenterology;  Laterality: N/A;  . HEMOSTASIS CLIP PLACEMENT  10/31/2019   Procedure: HEMOSTASIS CLIP PLACEMENT;  Surgeon: Irving Copas., MD;  Location: [place];  Service: Gastroenterology;;  . HYSTEROSCOPY WITH D & C N/A 02/16/2017   Procedure: DILATATION AND CURETTAGE Pol[place]ann Glen;  Surgeon: Brayton Mars, MD;  Location: [facility];  Service: Gynecology;  Laterality: N/A;  . JOINT REPLACEMENT    . kidney removal Left   . KIDNEY SURGERY Left 1979   left kidney removed  . LEFT HEART CATH AND CORONARY ANGIOGRAPHY N/A 10/21/2017   Procedure: LEFT HEART CATH AND CORONARY ANGIOGRAPHY;  Surgeon: Teodoro Spray, MD;  Location[facility] [place] [facility];  Service: Cardiovascular;  Laterality: N/A;  . POLYPECTOMY  10/31/2019   Procedure: POLYPECTOMY;  Surgeon: Irving Copas., MD;  Location: [place];  Service: Gastroenterology;;  . TOTAL KNEE ARTHROPLASTY Left 07/06/2017   Procedure: TOTAL KNEE ARTHROPLASTY;  Surgeon: Lovell Sheehan, MD;  Location: [facility];  Service: Orthopedics;  Laterality: Left;  . UPPER ESOPHAGEAL ENDOSCOPIC ULTRASOUND (EUS) N/A 10/31/2019   Procedure: UPPER ESOPHAGEAL ENDOSCOPIC ULTRASOUND (EUS);  Surgeon: Irving Copas., MD;  Location: [place];  Service: Gastroenterology;  Laterality: N/A;  . WRIST ARTHROSCOPY  Left 1970s  . XI ROBOTIC ASSISTED VENTRAL HERNIA N/A 12/01/2019   Procedure: XI ROBOTIC ASSISTED VENTRAL HERNIA;  Surgeon: Jules Husbands, MD;  Location: [facility] ORS;  Service: General;  Laterality: N/A;    There were no vitals filed for this visit.   Subjective Assessment - 06/26/20 0917    Subjective Patient reports no major changes since the previous session. Patient states increased angina yesterday which was transitory.    Pertinent History s/p TKA 07/06/2017; right knee arthritis with weakness as well with reports of right and left leg "giving way" intermittently; Going to have TKA 6/10    Limitations Walking;Standing;House hold activities;Other (comment)    How long can you sit comfortably? No Limit    How long can you stand comfortably? 2-3 hours    How long can you walk comfortably? 2-3 hours    Diagnostic tests imaging - see scanned docs    Patient Stated Goals Improve motion and strength of  hips and R knee and being able to walk with less/no pain.    Currently in Pain? No/denies    Pain Onset More than a month ago                 TREATMENT Therapeutic Exercise Leg press in sitting with use of [place]ME[place] -   x 10 105#, x 10 115# Nustep in sitting level 5 for 3 min - level 4 for 2 min Knee Extension at [place] - 3 x 10 35# Deadlifts in standing - 2 x 10; 30# KB  Performed exercises to improve LE strength     PT Education - 06/26/20 0918    Education provided Yes    Education Details form/technique with exercise    Person(s) Educated Patient    Methods Explanation;Demonstration    Comprehension Verbalized understanding;Returned demonstration            PT Short Term Goals - 04/04/20 1348      PT SHORT TERM GOAL #1   Title Patient will demonstrate independence with HEP to maximize rehab potential.    Time 3    Period Weeks    Status New    Target Date 04/25/20             PT Long Term Goals - 05/23/20 1455      PT LONG TERM GOAL #1   Title Patient will  demonstrate independence with progressive HEP to maintain progress made during therapy.    Time 6    Period Weeks    Status On-going      PT LONG TERM GOAL #2   Title Patient will improve FOTO score to 58 to show an improvement in patients ability to perform functional activities.    Baseline 50 at Eval; 05/23/20: 55    Time 6    Period Weeks    Status On-going      PT LONG TERM GOAL #3   Title Patient will have a worst pain of 3/10 to indicate significant improvement with pain and ability to perform  functional activities more functionally.     Baseline 10/10; 05/23/20: 5/10    Time 6    Period Weeks    Status On-going      PT LONG TERM GOAL #4   Title Patient will be able to descend stairs without pain to show improvement in quad strength and knee stability.    Baseline pain with descending stairs    Time 6    Period Weeks    Status Deferred      PT LONG TERM GOAL #5   Title Patient will improve 2MWT test by 68ft to show improvement in patients aerobic capacity and ability to ambulate out in community.    Baseline 458ft at eval.6    Time 6    Period Weeks    Status Deferred                 Plan - 06/26/20 0926    Clinical Impression Statement Blood pressure WNL with exercises performed today therefore continued session. COntinued to focus on improving LE strength through functional activities. Patiet does well however requires inreased rest breaks secondary to fatigue. Patient is improving overall and will benefit from further skilled therapy to return to prior level of function.    Personal Factors and Comorbidities Age;Comorbidity 2    Comorbidities HBP, Obesity    Examination-Activity Limitations Stairs;Locomotion Level;Stand    Examination-Participation Restrictions Shop    Stability/Clinical Decision Making Evolving/Moderate complexity    Rehab Potential Good    Clinical Impairments Affecting Rehab Potential (+) motivated, chronic condition (-) arthritis right  knee with pain    PT Frequency 2x / week    PT Duration 6 weeks    PT Treatment/Interventions Electrical Stimulation;Cryotherapy;Moist Heat;Gait training;Stair training;Therapeutic activities;Therapeutic exercise;Balance training;Patient/family education;Neuromuscular re-education;Manual techniques;Passive range of motion;Dry needling;Spinal Manipulations;Joint Manipulations;Functional mobility training;Traction    PT Next Visit Plan Monitor BP throughout session and continue POC    PT Home Exercise Plan Heel Raises, standing hip abduction    Consulted and Agree with Plan of Care Patient           Patient will benefit from skilled therapeutic intervention in order to improve the following deficits and impairments:  Pain, Decreased activity tolerance, Decreased endurance, Decreased range of motion, Decreased strength, Impaired perceived functional ability, Difficulty walking, Decreased mobility, Obesity  Visit Diagnosis: Muscle weakness (generalized)  Chronic pain of right knee  Pain in left hip     Problem List Patient Active Problem List   Diagnosis Date Noted  . Acute diverticulitis 05/13/2020  . Fever blister 10/15/2019  . Cerumen in auditory canal on examination 10/15/2019  . Bilateral hearing loss 10/15/2019  . Need for vaccination against Streptococcus pneumoniae using pneumococcal conjugate vaccine 13 10/15/2019  . Encounter for screening mammogram for malignant neoplasm of breast 10/15/2019  . Screening for osteoporosis 10/15/2019  . Elevated erythrocyte sedimentation rate 11/02/2018  . Lumbar spondylosis 11/02/2018  . PVC's (premature ventricular contractions) 10/18/2018  . Primary osteoarthritis of right knee 09/15/2018  . Encounter for pre-operative examination 09/15/2018  . Obstructive sleep apnea 09/15/2018  . Calculus of gallbladder without cholecystitis without obstruction 09/15/2018  . Calculus of gallbladder with cholecystitis without biliary obstruction  01/27/2018  . Restless leg syndrome 01/27/2018  . Generalized abdominal pain 01/11/2018  . Cyst of pancreas 01/11/2018  . Other specified disorders of kidney and ureter 01/11/2018  . Primary insomnia 01/11/2018  . Paroxysmal atrial fibrillation ([place]) 11/12/2017  . Essential hypertension 10/25/2017  . Mixed hyperlipidemia 10/25/2017  .  Low back pain at multiple sites 10/25/2017  . Dysuria 10/25/2017  . Encounter for general adult medical examination with abnormal findings 10/25/2017  . Vitamin D deficiency 10/25/2017  . Near syncope   . Symptomatic bradycardia 10/19/2017  . Carpal tunnel syndrome 10/12/2017  . Primary osteoarthritis of left knee 07/09/2017  . History of total knee arthroplasty 07/06/2017  . Chest pain 06/29/2017  . Postop check 02/25/2017  . Impingement syndrome of shoulder region 01/20/2017  . Neck pain 01/20/2017  . Obesity (BMI 35.0-39.9 without comorbidity) 07/15/2016  . Endometrial polyp 07/15/2016  . Morbid obesity ([place]) 07/15/2016  . Family history of breast cancer in first degree relative 07/15/2016  . Family history of ovarian cancer 07/15/2016  . Knee pain 07/04/2016  . Bilateral leg pain 06/24/2016    Blythe Stanford, PT DPT 06/26/2020, 9:35 AM  [place] [facility] [address] [place], [address] Phone: [phone]   Fax:  [phone]  Name: SHIRLYN SAVIN MRN: [id number] Date of Birth: June 14, 1951

## 2020-06-26 NOTE — ED Triage Notes (Signed)
EMS brings pt in from home for c/o upper abd pain for few days; pt to lobby via w/c with no distress noted; pt with frequent coughing noted

## 2020-06-26 NOTE — ED Triage Notes (Signed)
Pt to ED via EMS from home. PT c/o epigastric/chest pain, cough and SHOB for 3 days. PT states she can't get her breath and spits up mucous. No fevers, vomiting or diarrhea. PT also c/o headache. PT appears short of breath after a long sentence.

## 2020-06-27 ENCOUNTER — Emergency Department: Payer: Medicare Other

## 2020-06-27 ENCOUNTER — Emergency Department
Admission: EM | Admit: 2020-06-27 | Discharge: 2020-06-27 | Disposition: A | Discharge status: Home / Self Care | Payer: Medicare Other | Attending: Emergency Medicine | Admitting: Emergency Medicine

## 2020-06-27 ENCOUNTER — Ambulatory Visit: Payer: Medicare Other

## 2020-06-27 DIAGNOSIS — R079 Chest pain, unspecified: Secondary | ICD-10-CM | POA: Diagnosis not present

## 2020-06-27 DIAGNOSIS — M7121 Synovial cyst of popliteal space [Baker], right knee: Secondary | ICD-10-CM | POA: Diagnosis not present

## 2020-06-27 DIAGNOSIS — R600 Localized edema: Secondary | ICD-10-CM | POA: Diagnosis not present

## 2020-06-27 DIAGNOSIS — R059 Cough, unspecified: Secondary | ICD-10-CM | POA: Diagnosis not present

## 2020-06-27 DIAGNOSIS — M7989 Other specified soft tissue disorders: Secondary | ICD-10-CM | POA: Diagnosis not present

## 2020-06-27 DIAGNOSIS — R0609 Other forms of dyspnea: Secondary | ICD-10-CM

## 2020-06-27 DIAGNOSIS — R0600 Dyspnea, unspecified: Secondary | ICD-10-CM

## 2020-06-27 DIAGNOSIS — R0602 Shortness of breath: Secondary | ICD-10-CM | POA: Diagnosis not present

## 2020-06-27 HISTORY — DX: Diverticulitis of intestine, part unspecified, without perforation or abscess without bleeding: K57.92

## 2020-06-27 LAB — TROPONIN I (HIGH SENSITIVITY)
Troponin I (High Sensitivity): 9 ng/L (ref <18)
Troponin I (High Sensitivity): 7 ng/L (ref <18)

## 2020-06-27 LAB — RESP PANEL BY RT-PCR (FLU A&B, COVID) ARPGX2
Influenza A by PCR: NEGATIVE
Influenza B by PCR: NEGATIVE
SARS Coronavirus 2 by RT PCR: NEGATIVE

## 2020-06-27 MED ORDER — ALBUTEROL SULFATE HFA 108 (90 BASE) MCG/ACT IN AERS: INHALATION_SPRAY | RESPIRATORY_TRACT | 0 refills | Status: DC

## 2020-06-27 MED ORDER — SODIUM CHLORIDE 0.9 % IV BOLUS
500.0000 mL | Freq: Once | INTRAVENOUS | Status: AC
  Administered 2020-06-27: 500 mL via INTRAVENOUS

## 2020-06-27 MED ORDER — IOHEXOL 350 MG/ML SOLN
75.0000 mL | Freq: Once | INTRAVENOUS | Status: AC | PRN
  Administered 2020-06-27: 75 mL via INTRAVENOUS

## 2020-06-27 NOTE — ED Notes (Signed)
Assumed care of pt upon being roomed. Moist persistent cough noted, O2 sat 98% on RA. Pt watching tv in room. Awaiting further orders by provider. AO x4, talking in full sentences with regular and unlabored breathing

## 2020-06-27 NOTE — ED Provider Notes (Signed)
[facility] Emergency Department Provider Note  ____________________________________________   First MD Initiated Contact with Patient 06/27/20 [phone]     (approximate)  I have reviewed the triage vital signs and the nursing notes.   HISTORY  Chief Complaint Shortness of Breath and Abdominal Pain    HPI Whitney [name] is a 69 y.o. female with medical history as listed below who presents for evaluation of a couple of days of gradually worsening shortness of breath which is considerably worse with exertion, as well as intermittent sharp stabbing pain in the center of her chest beneath her breastbone.  Nothing in particular makes the pain better or worse.  She also reports that she has had intermittent pain and swelling in her right lower leg for the last few weeks.  It feels a little bit better now after getting a cortisone injection by her orthopedic surgeon, but she still notes that only the right leg has been involved.  She has no prior history of blood clots in the legs of the lungs.  She has never had a heart attack.  She is not on anticoagulation.  She has had no recent surgeries or immobilizations or been on any long trips.  She does not use exogenous estrogen.  She describes the symptoms as severe, particularly the shortness of breath.  She denies fever/chills, sore throat, abdominal pain, and dysuria.     She reports that she is fully vaccinated for COVID-19.    Past Medical History:  Diagnosis Date  . Abnormal Pap smear of cervix    Pt states she had colposcopy   . Anxiety   . Arthritis   . Chronic kidney disease    removed left kidney  . Depression   . Diverticulitis   . Dyspnea   . Dysrhythmia   . Hx of unilateral nephrectomy 1979   Left Nephrectomy  . Hypertension   . Lower extremity edema   . Palpitations   . PMB (postmenopausal bleeding)   . Restless leg syndrome   . Sleep apnea    has no cpap  . Vitamin D deficiency     Patient  Active Problem List   Diagnosis Date Noted  . Acute diverticulitis 05/13/2020  . Fever blister 10/15/2019  . Cerumen in auditory canal on examination 10/15/2019  . Bilateral hearing loss 10/15/2019  . Need for vaccination against Streptococcus pneumoniae using pneumococcal conjugate vaccine 13 10/15/2019  . Encounter for screening mammogram for malignant neoplasm of breast 10/15/2019  . Screening for osteoporosis 10/15/2019  . Elevated erythrocyte sedimentation rate 11/02/2018  . Lumbar spondylosis 11/02/2018  . PVC's (premature ventricular contractions) 10/18/2018  . Primary osteoarthritis of right knee 09/15/2018  . Encounter for pre-operative examination 09/15/2018  . Obstructive sleep apnea 09/15/2018  . Calculus of gallbladder without cholecystitis without obstruction 09/15/2018  . Calculus of gallbladder with cholecystitis without biliary obstruction 01/27/2018  . Restless leg syndrome 01/27/2018  . Generalized abdominal pain 01/11/2018  . Cyst of pancreas 01/11/2018  . Other specified disorders of kidney and ureter 01/11/2018  . Primary insomnia 01/11/2018  . Paroxysmal atrial fibrillation ([place]) 11/12/2017  . Essential hypertension 10/25/2017  . Mixed hyperlipidemia 10/25/2017  . Low back pain at multiple sites 10/25/2017  . Dysuria 10/25/2017  . Encounter for general adult medical examination with abnormal findings 10/25/2017  . Vitamin D deficiency 10/25/2017  . Near syncope   . Symptomatic bradycardia 10/19/2017  . Carpal tunnel syndrome 10/12/2017  . Primary osteoarthritis of left knee  07/09/2017  . History of total knee arthroplasty 07/06/2017  . Chest pain 06/29/2017  . Postop check 02/25/2017  . Impingement syndrome of shoulder region 01/20/2017  . Neck pain 01/20/2017  . Obesity (BMI 35.0-39.9 without comorbidity) 07/15/2016  . Endometrial polyp 07/15/2016  . Morbid obesity ([place]) 07/15/2016  . Family history of breast cancer in first degree relative 07/15/2016    . Family history of ovarian cancer 07/15/2016  . Knee pain 07/04/2016  . Bilateral leg pain 06/24/2016    Past Surgical History:  Procedure Laterality Date  . BIOPSY  10/31/2019   Procedure: BIOPSY;  Surgeon: Rush Landmark Telford Nab., MD;  Location: [place];  Service: Gastroenterology;;  . CARDIAC CATHETERIZATION    . CHOLECYSTECTOMY    . COLONOSCOPY WITH PROPOFOL N/A 06/13/2019   Procedure: COLONOSCOPY WITH PROPOFOL;  Surgeon: [name], MD;  Location: [facility] ENDOSCOPY;  Service: Gastroenterology;  Laterality: N/A;  . COLONOSCOPY WITH PROPOFOL N/A 08/12/2019   Procedure: COLONOSCOPY WITH PROPOFOL;  Surgeon: [name], MD;  Location: [facility] ENDOSCOPY;  Service: Gastroenterology;  Laterality: N/A;  . COLONOSCOPY WITH PROPOFOL N/A 09/19/2019   Procedure: COLONOSCOPY WITH PROPOFOL;  Surgeon: [name], MD;  Location: [facility] ENDOSCOPY;  Service: Gastroenterology;  Laterality: N/A;  . DILATION AND CURETTAGE OF UTERUS    . ESOPHAGOGASTRODUODENOSCOPY (EGD) WITH PROPOFOL N/A 06/13/2019   Procedure: ESOPHAGOGASTRODUODENOSCOPY (EGD) WITH PROPOFOL;  Surgeon: [name], MD;  Location: [facility] ENDOSCOPY;  Service: Gastroenterology;  Laterality: N/A;  Pt will go for COVID test on 58-8-50 as she uses public transportation and will be in the area on that day.   . ESOPHAGOGASTRODUODENOSCOPY (EGD) WITH PROPOFOL N/A 10/31/2019   Procedure: ESOPHAGOGASTRODUODENOSCOPY (EGD) WITH PROPOFOL;  Surgeon: Rush Landmark Telford Nab., MD;  Location: [place];  Service: Gastroenterology;  Laterality: N/A;  . HEMOSTASIS CLIP PLACEMENT  10/31/2019   Procedure: HEMOSTASIS CLIP PLACEMENT;  Surgeon: Irving Copas., MD;  Location: [place];  Service: Gastroenterology;;  . HYSTEROSCOPY WITH D & C N/A 02/16/2017   Procedure: DILATATION AND CURETTAGE [place];  Surgeon: Brayton Mars, MD;  Location: [facility]S;  Service: Gynecology;  Laterality: N/A;  . JOINT REPLACEMENT    . kidney removal Left   . KIDNEY  SURGERY Left 1979   left kidney removed  . LEFT HEART CATH AND CORONARY ANGIOGRAPHY N/A 10/21/2017   Procedure: LEFT HEART CATH AND CORONARY ANGIOGRAPHY;  Surgeon: Teodoro Spray, MD;  Location: [place] [facility];  Service: Cardiovascular;  Laterality: N/A;  . POLYPECTOMY  10/31/2019   Procedure: POLYPECTOMY;  Surgeon: Rush Landmark Telford Nab., MD;  Location: [place];  Service: Gastroenterology;;  . TOTAL KNEE ARTHROPLASTY Left 07/06/2017   Procedure: TOTAL KNEE ARTHROPLASTY;  Surgeon: Lovell Sheehan, MD;  Location: [facility];  Service: Orthopedics;  Laterality: Left;  . UPPER ESOPHAGEAL ENDOSCOPIC ULTRASOUND (EUS) N/A 10/31/2019   Procedure: UPPER ESOPHAGEAL ENDOSCOPIC ULTRASOUND (EUS);  Surgeon: Irving Copas., MD;  Location: [place];  Service: Gastroenterology;  Laterality: N/A;  . WRIST ARTHROSCOPY Left 1970s  . XI ROBOTIC ASSISTED VENTRAL HERNIA N/A 12/01/2019   Procedure: XI ROBOTIC ASSISTED VENTRAL HERNIA;  Surgeon: Jules Husbands, MD;  Location: [facility] ORS;  Service: General;  Laterality: N/A;    Prior to Admission medications   Medication Sig Start Date End Date Taking? Authorizing Provider  albuterol (VENTOLIN HFA) 108 (90 Base) MCG/ACT inhaler Inhale 2-4 puffs by mouth every 4 hours as needed for wheezing, cough, and/or shortness of breath 06/27/20   Hinda Kehr, MD  amLODipine (NORVASC)  5 MG tablet Take 1 tablet (5 mg total) by mouth 2 (two) times daily. 01/25/20   Ronnell Freshwater, NP  aspirin EC 81 MG tablet Take 81 mg by mouth daily.     [provider]  atorvastatin (LIPITOR) 40 MG tablet Take 1 tablet (40 mg total) by mouth daily at 6 PM. Patient taking differently: Take 40 mg by mouth at bedtime.  09/29/19   Kendell Bane, NP  carvedilol (COREG) 3.125 MG tablet TAKE 2 TABLETS(6.25 MG) BY MOUTH TWICE DAILY WITH A MEAL Patient taking differently: Take 6.25 mg by mouth in the morning and at bedtime.  08/30/19   Lavera Guise, MD  Cholecalciferol  (VITAMIN D) 50 MCG (2000 UT) tablet Take 2,000 Units by mouth in the morning and at bedtime.     [provider]  diclofenac Sodium (VOLTAREN) 1 % GEL Apply 1 application topically 4 (four) times daily as needed (pain).     [provider]  fluconazole (DIFLUCAN) 150 MG tablet Take 1 tab po every other day Patient not taking: Reported on 06/04/2020 05/23/20   Ronnell Freshwater, NP  gabapentin (NEURONTIN) 100 MG capsule Take 100 mg by mouth 2 (two) times daily.    [provider]  orlistat (ALLI) 60 MG capsule Take 60 mg by mouth 3 (three) times daily with meals.    [provider]  OVER THE COUNTER MEDICATION Apply 1 application topically in the morning and at bedtime. Tummy cream    [provider]  rOPINIRole (REQUIP) 0.5 MG tablet Take 0.5-1 mg by mouth 2 (two) times daily as needed (pain).    [provider]  traMADol Veatrice Bourbon) 50 MG tablet Take one tab po bid for pain Patient not taking: Reported on 06/04/2020 05/10/20   Lavera Guise, MD    Allergies Enalapril and Lisinopril  Family History  Problem Relation Age of Onset  . Ovarian cancer Mother   . Hypertension Father   . Diabetes Father   . Cancer Father   . Breast cancer Sister   . Diabetes Sister     Social History Social History   Tobacco Use  . Smoking status: Never Smoker  . Smokeless tobacco: Never Used  Vaping Use  . Vaping Use: Never used  Substance Use Topics  . Alcohol use: Yes    Alcohol/week: 2.0 standard drinks    Types: 2 Standard drinks or equivalent per week    Comment: occasionally  . Drug use: Yes    Frequency: 3.0 times per week    Types: Marijuana, Cocaine    Comment: Pt states she uses for leg pain: marijuana; states no longer uses cocaine    Review of Systems Constitutional: No fever/chills Eyes: No visual changes. ENT: No sore throat. Cardiovascular: +chest pain. Respiratory: +shortness of breath, worse with exertion. Gastrointestinal:  No abdominal pain.  No nausea, no vomiting.  No diarrhea.  No constipation. Genitourinary: Negative for dysuria. Musculoskeletal: Intermittent unilateral leg pain and swelling on the right for the last couple of weeks. Integumentary: Negative for rash. Neurological: Negative for headaches, focal weakness or numbness.   ____________________________________________   PHYSICAL EXAM:  VITAL SIGNS: ED Triage Vitals  Enc Vitals Group     BP 06/26/20 2249 (!) 135/93     Pulse Rate 06/26/20 2249 93     Resp 06/26/20 2249 (!) 21     Temp 06/26/20 2249 97.8 F (36.6 C)     Temp Source 06/26/20 2249 Oral  SpO2 06/26/20 2249 95 %     Weight 06/26/20 2243 108 kg (238 lb)     Height 06/26/20 2243 1.651 m (5\' 5" )     Head Circumference --      Peak Flow --      Pain Score 06/26/20 2242 8     Pain Loc --      Pain Edu? --      Excl. in [place]? --     Constitutional: Alert and oriented.  Eyes: Conjunctivae are normal.  Head: Atraumatic. Nose: No congestion/rhinnorhea. Mouth/Throat: Patient is wearing a mask. Neck: No stridor.  No meningeal signs.   Cardiovascular: Borderline tachycardia, regular rhythm. Good peripheral circulation. Grossly normal heart sounds. Respiratory: Normal respiratory effort.  No retractions.  Lungs are clear to auscultation with no wheezing or coarse breath sounds. Gastrointestinal: Soft and nontender. No distention.  Musculoskeletal: No gross deformities of the extremities.  At this point I do not appreciate any edema and there is no erythema, fluctuance, nor induration of the right lower extremity. Neurologic:  Normal speech and language. No gross focal neurologic deficits are appreciated.  Skin:  Skin is warm, dry and intact. Psychiatric: Mood and affect are normal. Speech and behavior are normal.  ____________________________________________   LABS (all labs ordered are listed, but only abnormal results are displayed)  Labs Reviewed  COMPREHENSIVE  METABOLIC PANEL - Abnormal; Notable for the following components:      Result Value   Glucose, Bld 105 (*)    Creatinine, Ser 1.12 (*)    Calcium 8.4 (*)    Albumin 3.3 (*)    GFR, Estimated 53 (*)    All other components within normal limits  CBC - Abnormal; Notable for the following components:   Hemoglobin 11.4 (*)    All other components within normal limits  RESP PANEL BY RT-PCR (FLU A&B, COVID) ARPGX2  LIPASE, BLOOD  URINALYSIS, COMPLETE (UACMP) WITH MICROSCOPIC  TROPONIN I (HIGH SENSITIVITY)  TROPONIN I (HIGH SENSITIVITY)   ____________________________________________  EKG  ED ECG REPORT I, Hinda Kehr, the attending physician, personally viewed and interpreted this ECG.  Date: 06/26/2020 EKG Time: 22: 37 Rate: 91 Rhythm: normal sinus rhythm QRS Axis: normal Intervals: normal ST/T Wave abnormalities: normal Narrative Interpretation: no evidence of acute ischemia.  There are some abnormal waveforms in leads I and II but I believe this was due to artifact rather than representing an acute cardiac abnormality.  ____________________________________________  RADIOLOGY Ursula Alert, personally viewed and evaluated these images (plain radiographs) as part of my medical decision making, as well as reviewing the written report by the radiologist.  ED MD interpretation: No acute abnormalities identified on chest x-ray. No acute abnormalities identified on CTA chest nor U/S RLE.  Official radiology report(s): DG Chest 2 View  Result Date: 06/26/2020 CLINICAL DATA:  Chest pain.  Shortness of breath. EXAM: CHEST - 2 VIEW COMPARISON:  05/13/2020 FINDINGS: The heart size is stable. Aortic calcifications are noted. There is no pneumothorax. No large pleural effusion. No evidence for focal infiltrate. IMPRESSION: No active cardiopulmonary disease. Electronically Signed   By: Constance Holster M.D.   On: 06/26/2020 23:09   CT Angio Chest PE W/Cm &/Or Wo Cm  Result Date:  06/27/2020 CLINICAL DATA:  69 year old female with chest and epigastric pain. Cough and shortness of breath for 3 days. EXAM: CT ANGIOGRAPHY CHEST WITH CONTRAST TECHNIQUE: Multidetector CT imaging of the chest was performed using the standard protocol during bolus administration of intravenous contrast.  Multiplanar CT image reconstructions and MIPs were obtained to evaluate the vascular anatomy. CONTRAST:  A total of 150 mL OMNIPAQUE IOHEXOL 350 MG/ML SOLN, following initial suboptimal contrast bolus. COMPARISON:  Chest radiographs yesterday. CT Abdomen and Pelvis 05/13/2020. FINDINGS: Cardiovascular: Ultimately good contrast bolus timing in the pulmonary arterial tree. Mild respiratory motion. No focal filling defect identified in the pulmonary arteries to suggest acute pulmonary embolism. Calcified coronary artery atherosclerosis (series 10, image 106). Calcified aortic atherosclerosis. Cardiac size at the upper limits of normal to mildly enlarged. No pericardial effusion No lymphadenopathy. Mediastinum/Nodes: Negative. Lungs/Pleura: Major airways are patent. Relatively normal lung volumes. Both lungs appear clear. No pleural effusion Upper Abdomen: Stable visible upper abdominal viscera, including circumscribed simple fluid density cyst(s) in the liver. Diverticulosis at the splenic flexure. Musculoskeletal: Degenerative changes in the spine. Moderate to severe degeneration of the visible left glenohumeral joint. No acute or suspicious osseous lesion. Review of the MIP images confirms the above findings. IMPRESSION: 1. Negative for pulmonary embolus. 2. No acute finding in the chest. 3. Calcified coronary artery and Aortic Atherosclerosis (ICD10-I70.0). Electronically Signed   By: Genevie Ann M.D.   On: 06/27/2020 07:40   US Venous Img Lower Unilateral Right  Result Date: 06/27/2020 CLINICAL DATA:  Intermittent pain and swelling of the right leg. Pain, edema, and anticoagulation therapy. EXAM: Right LOWER  EXTREMITY VENOUS DOPPLER ULTRASOUND TECHNIQUE: Gray-scale sonography with compression, as well as color and duplex ultrasound, were performed to evaluate the deep venous system(s) from the level of the common femoral vein through the popliteal and proximal calf veins. COMPARISON:  None. FINDINGS: VENOUS Normal compressibility of the common femoral, superficial femoral, and popliteal veins, as well as the visualized calf veins. Visualized portions of profunda femoral vein and great saphenous vein unremarkable. No filling defects to suggest DVT on grayscale or color Doppler imaging. Doppler waveforms show normal direction of venous flow, normal respiratory plasticity and response to augmentation. Limited views of the contralateral common femoral vein are unremarkable. OTHER Small popliteal cyst with internal calcification demonstrated in the popliteal fossa. This measures 3.7 x 1.6 x 2.7 cm. Limitations: none IMPRESSION: 1. No evidence of deep venous thrombosis in the right lower extremity. 2. A small popliteal cyst with internal calcification is incidentally noted. 3. If clinical symptoms are inconsistent or if there are persistent or worsening symptoms, further imaging (possibly involving the iliac veins) may be warranted. Electronically Signed   By: Lucienne Capers M.D.   On: 06/27/2020 06:29    ____________________________________________   PROCEDURES   Procedure(s) performed (including Critical Care):  .1-3 Lead EKG Interpretation Performed by: Hinda Kehr, MD Authorized by: Hinda Kehr, MD     Interpretation: normal     ECG rate:  90   ECG rate assessment: normal     Rhythm: sinus rhythm     Ectopy: none     Conduction: normal       ____________________________________________   INITIAL IMPRESSION / MDM / ASSESSMENT AND PLAN / ED COURSE  As part of my medical decision making, I reviewed the following data within the [place] notes reviewed and  incorporated, Labs reviewed , EKG interpreted , Old chart reviewed, Radiograph reviewed  and Notes from prior ED visits   Differential diagnosis includes, but is not limited to, anxiety, pneumonia, asthma, COPD, PE, ACS.  Vital signs are generally reassuring although she is initially borderline tachycardic with a heart rate between 90 and 100.  No evidence of ischemia on  EKG.  I personally reviewed the patient's imaging and agree with the radiologist's interpretation that there is no evidence of acute abnormality on chest x-ray.  Comprehensive metabolic panel is essentially normal other than a very slightly elevated creatinine likely due to her prior history of nephrectomy.  CBC is normal, lipase is normal, Covid 19 PCR test is negative.  High-sensitivity troponin is essentially normal at 7 and 9.  However the patient has a number of risk factors and comorbid conditions and is describing commendation of symptoms that could be attributable to DVT and/or PE.  I think it is a low probability but given her age and comorbidities I think it is reasonable to evaluate with an ultrasound of the right lower extremity and a CTA chest.  I discussed all this with her and she understands and agrees with the plan.  If this testing is reassuring with no evidence of an emergent finding she is comfortable with the plan for discharge and outpatient follow-up.  The patient is on the cardiac monitor to evaluate for evidence of arrhythmia and/or significant heart rate changes.     Clinical Course as of Jun 27 813  Wed Jun 27, 2020  9924 No evidence of acute or emergent abnormality on CTA chest nor on ultrasound of the right lower extremity.  The patient has been resting and sleeping comfortably.  I updated her with the results and she is comfortable with the plan to go home and would like to resume physical therapy and asked that I put that note and her discharge paperwork.  I also wrote her a prescription for albuterol  inhaler because she told me that she tried using a relatives and that it helped make her feel little bit better.  I gave my usual and customary return precautions and follow-up recommendations.   [CF]    Clinical Course User Index [CF] Hinda Kehr, MD     ____________________________________________  FINAL CLINICAL IMPRESSION(S) / ED DIAGNOSES  Final diagnoses:  Dyspnea on exertion  Chest pain, unspecified type     MEDICATIONS GIVEN DURING THIS VISIT:  Medications  sodium chloride 0.9 % bolus 500 mL (0 mLs Intravenous Stopped 06/27/20 0700)  iohexol (OMNIPAQUE) 350 MG/ML injection 75 mL (75 mLs Intravenous Contrast Given 06/27/20 0645)     ED Discharge Orders         Ordered    albuterol (VENTOLIN HFA) 108 (90 Base) MCG/ACT inhaler       Note to Pharmacy: Pharmacy may substitute brand and size for insurance-approved equivalent   06/27/20 0806          *Please note:  Whitney [name] was evaluated in Emergency Department on 06/27/2020 for the symptoms described in the history of present illness. She was evaluated in the context of the global COVID-19 pandemic, which necessitated consideration that the patient might be at risk for infection with the SARS-CoV-2 virus that causes COVID-19. Institutional protocols and algorithms that pertain to the evaluation of patients at risk for COVID-19 are in a state of rapid change based on information released by regulatory bodies including the CDC and federal and state organizations. These policies and algorithms were followed during the patient's care in the ED.  Some ED evaluations and interventions may be delayed as a result of limited staffing during and after the pandemic.*  Note:  This document was prepared using Dragon voice recognition software and may include unintentional dictation errors.   Hinda Kehr, MD 06/27/20 [phone]

## 2020-06-27 NOTE — Discharge Instructions (Addendum)
Your workup in the Emergency Department today was reassuring.  We did not find any specific abnormalities.  We recommend you drink plenty of fluids, take your regular medications and/or any new ones prescribed today, and follow up with the doctor(s) listed in these documents as recommended.  Please resume your usual physical therapy plan.  Return to the Emergency Department if you develop new or worsening symptoms that concern you.

## 2020-07-03 ENCOUNTER — Ambulatory Visit: Payer: Medicare Other

## 2020-07-03 ENCOUNTER — Other Ambulatory Visit: Payer: Self-pay

## 2020-07-03 DIAGNOSIS — M6281 Muscle weakness (generalized): Secondary | ICD-10-CM | POA: Diagnosis not present

## 2020-07-03 DIAGNOSIS — M25552 Pain in left hip: Secondary | ICD-10-CM

## 2020-07-03 DIAGNOSIS — M25561 Pain in right knee: Secondary | ICD-10-CM | POA: Diagnosis not present

## 2020-07-03 DIAGNOSIS — G8929 Other chronic pain: Secondary | ICD-10-CM

## 2020-07-03 NOTE — Therapy (Signed)
[facility] [address] [place], [address] Phone: [phone]   Fax:  [phone]  Physical Therapy Treatment  Patient Details  Name: KARLEN BARBAR MRN: [id number] Date of Birth: 1950-08-18 Referring Provider (PT): Kurtis Bushman MD   Encounter Date: 07/03/2020   PT End of Session - 07/03/20 1404    Visit Number 15    Number of Visits 21    Date for PT Re-Evaluation 07/04/20    Authorization Type 5/10    PT Start Time 1400    PT Stop Time 1415    PT Time Calculation (min) 15 min    Activity Tolerance Patient tolerated treatment well;Patient limited by pain    Behavior During Therapy [facility] for tasks assessed/performed           Past Medical History:  Diagnosis Date  . Abnormal Pap smear of cervix    Pt states she had colposcopy   . Anxiety   . Arthritis   . Chronic kidney disease    removed left kidney  . Depression   . Diverticulitis   . Dyspnea   . Dysrhythmia   . Hx of unilateral nephrectomy 1979   Left Nephrectomy  . Hypertension   . Lower extremity edema   . Palpitations   . PMB (postmenopausal bleeding)   . Restless leg syndrome   . Sleep apnea    has no cpap  . Vitamin D deficiency     Past Surgical History:  Procedure Laterality Date  . BIOPSY  10/31/2019   Procedure: BIOPSY;  Surgeon: Rush Landmark Telford Nab., MD;  Location: [place];  Service: Gastroenterology;;  . CARDIAC CATHETERIZATION    . CHOLECYSTECTOMY    . COLONOSCOPY WITH PROPOFOL N/A 06/13/2019   Procedure: COLONOSCOPY WITH PROPOFOL;  Surgeon: [name], MD;  Location: [facility] ENDOSCOPY;  Service: Gastroenterology;  Laterality: N/A;  . COLONOSCOPY WITH PROPOFOL N/A 08/12/2019   Procedure: COLONOSCOPY WITH PROPOFOL;  Surgeon: [name], MD;  Location: [facility] ENDOSCOPY;  Service: Gastroenterology;  Laterality: N/A;  . COLONOSCOPY WITH PROPOFOL N/A 09/19/2019   Procedure: COLONOSCOPY WITH PROPOFOL;  Surgeon: [name], MD;  Location: [facility] ENDOSCOPY;  Service: Gastroenterology;  Laterality: N/A;  . DILATION AND CURETTAGE OF UTERUS    . ESOPHAGOGASTRODUODENOSCOPY (EGD) WITH PROPOFOL N/A 06/13/2019   Procedure: ESOPHAGOGASTRODUODENOSCOPY (EGD) WITH PROPOFOL;  Surgeon: [name], MD;  Location: [facility] ENDOSCOPY;  Service: Gastroenterology;  Laterality: N/A;  Pt will go for COVID test on [id number] as she uses public transportation and will be in the area on that day.   . ESOPHAGOGASTRODUODENOSCOPY (EGD) WITH PROPOFOL N/A 10/31/2019   Procedure: ESOPHAGOGASTRODUODENOSCOPY (EGD) WITH PROPOFOL;  Surgeon: Rush Landmark Telford Nab., MD;  Location: [place];  Service: Gastroenterology;  Laterality: N/A;  . HEMOSTASIS CLIP PLACEMENT  10/31/2019   Procedure: HEMOSTASIS CLIP PLACEMENT;  Surgeon: Irving Copas., MD;  Location: [place];  Service: Gastroenterology;;  . HYSTEROSCOPY WITH D & C N/A 02/16/2017   Procedure: DILATATION AND CURETTAGE [place];  Surgeon: Brayton Mars, MD;  Location: [facility];  Service: Gynecology;  Laterality: N/A;  . JOINT REPLACEMENT    . kidney removal Left   . KIDNEY SURGERY Left 1979   left kidney removed  . LEFT HEART CATH AND CORONARY ANGIOGRAPHY N/A 10/21/2017   Procedure: LEFT HEART CATH AND CORONARY ANGIOGRAPHY;  Surgeon: Teodoro Spray, MD;  Location: [place] CV [facility];  Service: Cardiovascular;  Laterality: N/A;  . POLYPECTOMY  10/31/2019  Procedure: POLYPECTOMY;  Surgeon: Rush Landmark Telford Nab., MD;  Location: [place];  Service: Gastroenterology;;  . TOTAL KNEE ARTHROPLASTY Left 07/06/2017   Procedure: TOTAL KNEE ARTHROPLASTY;  Surgeon: Lovell Sheehan, MD;  Location: [facility];  Service: Orthopedics;  Laterality: Left;  . UPPER ESOPHAGEAL ENDOSCOPIC ULTRASOUND (EUS) N/A 10/31/2019   Procedure: UPPER ESOPHAGEAL ENDOSCOPIC ULTRASOUND (EUS);  Surgeon: Irving Copas., MD;  Location: [place];  Service: Gastroenterology;  Laterality: N/A;  .  WRIST ARTHROSCOPY Left 1970s  . XI ROBOTIC ASSISTED VENTRAL HERNIA N/A 12/01/2019   Procedure: XI ROBOTIC ASSISTED VENTRAL HERNIA;  Surgeon: Jules Husbands, MD;  Location: [facility];  Service: General;  Laterality: N/A;    There were no vitals filed for this visit.   Subjective Assessment - 07/03/20 1357    Subjective Patient reports she had to go to the ED secondary to SOB and DOE. Patient states she is COVID negative and EKG with WNL. Patient states she has been feeling better since returning home with an albuteral inhaler.    Pertinent History s/p TKA 07/06/2017; right knee arthritis with weakness as well with reports of right and left leg "giving way" intermittently; Going to have TKA 6/10    Limitations Walking;Standing;House hold activities;Other (comment)    How long can you sit comfortably? No Limit    How long can you stand comfortably? 2-3 hours    How long can you walk comfortably? 2-3 hours    Diagnostic tests imaging - see scanned docs    Patient Stated Goals Improve motion and strength of  hips and R knee and being able to walk with less/no pain.    Currently in Pain? No/denies    Pain Onset More than a month ago           TREATMENT Therapeutic Exercise NuStep level 1 - with seat level at 8 with use of UE to address LE strength and endurance -- 8 min Seated knee flexion from chair -- x15  Performed exercises to address knee strength     PT Education - 07/03/20 1359    Education provided Yes    Education Details form/technique with exercise    Person(s) Educated Patient    Methods Explanation;Demonstration    Comprehension Verbalized understanding;Returned demonstration            PT Short Term Goals - 04/04/20 1348      PT SHORT TERM GOAL #1   Title Patient will demonstrate independence with HEP to maximize rehab potential.    Time 3    Period Weeks    Status New    Target Date 04/25/20             PT Long Term Goals - 05/23/20 1455      PT LONG  TERM GOAL #1   Title Patient will demonstrate independence with progressive HEP to maintain progress made during therapy.    Time 6    Period Weeks    Status On-going      PT LONG TERM GOAL #2   Title Patient will improve FOTO score to 58 to show an improvement in patients ability to perform functional activities.    Baseline 50 at Eval; 05/23/20: 55    Time 6    Period Weeks    Status On-going      PT LONG TERM GOAL #3   Title Patient will have a worst pain of 3/10 to indicate significant improvement with pain and ability to perform functional activities  more functionally.     Baseline 10/10; 05/23/20: 5/10    Time 6    Period Weeks    Status On-going      PT LONG TERM GOAL #4   Title Patient will be able to descend stairs without pain to show improvement in quad strength and knee stability.    Baseline pain with descending stairs    Time 6    Period Weeks    Status Deferred      PT LONG TERM GOAL #5   Title Patient will improve 2MWT test by 85ft to show improvement in patients aerobic capacity and ability to ambulate out in community.    Baseline 427ft at eval.6    Time 6    Period Weeks    Status Deferred                 Plan - 07/03/20 1405    Clinical Impression Statement Shortened sessiontoday secondary to Blood pressure limitations as well as patient requesting to stop early secondary to nervousness about procedure tomorrow (knee injection). Focused on improving LE strength lightly through exercises performed today. Patient will benefi from further skilled therapy to return to prior level of function.    Personal Factors and Comorbidities Age;Comorbidity 2    Comorbidities HBP, Obesity    Examination-Activity Limitations Stairs;Locomotion Level;Stand    Examination-Participation Restrictions Shop    Stability/Clinical Decision Making Evolving/Moderate complexity    Rehab Potential Good    Clinical Impairments Affecting Rehab Potential (+) motivated, chronic  condition (-) arthritis right knee with pain    PT Frequency 2x / week    PT Duration 6 weeks    PT Treatment/Interventions Electrical Stimulation;Cryotherapy;Moist Heat;Gait training;Stair training;Therapeutic activities;Therapeutic exercise;Balance training;Patient/family education;Neuromuscular re-education;Manual techniques;Passive range of motion;Dry needling;Spinal Manipulations;Joint Manipulations;Functional mobility training;Traction    PT Next Visit Plan Monitor BP throughout session and continue POC    PT Home Exercise Plan Heel Raises, standing hip abduction    Consulted and Agree with Plan of Care Patient           Patient will benefit from skilled therapeutic intervention in order to improve the following deficits and impairments:  Pain, Decreased activity tolerance, Decreased endurance, Decreased range of motion, Decreased strength, Impaired perceived functional ability, Difficulty walking, Decreased mobility, Obesity  Visit Diagnosis: Muscle weakness (generalized)  Chronic pain of right knee  Pain in left hip     Problem List Patient Active Problem List   Diagnosis Date Noted  . Acute diverticulitis 05/13/2020  . Fever blister 10/15/2019  . Cerumen in auditory canal on examination 10/15/2019  . Bilateral hearing loss 10/15/2019  . Need for vaccination against Streptococcus pneumoniae using pneumococcal conjugate vaccine 13 10/15/2019  . Encounter for screening mammogram for malignant neoplasm of breast 10/15/2019  . Screening for osteoporosis 10/15/2019  . Elevated erythrocyte sedimentation rate 11/02/2018  . Lumbar spondylosis 11/02/2018  . PVC's (premature ventricular contractions) 10/18/2018  . Primary osteoarthritis of right knee 09/15/2018  . Encounter for pre-operative examination 09/15/2018  . Obstructive sleep apnea 09/15/2018  . Calculus of gallbladder without cholecystitis without obstruction 09/15/2018  . Calculus of gallbladder with cholecystitis  without biliary obstruction 01/27/2018  . Restless leg syndrome 01/27/2018  . Generalized abdominal pain 01/11/2018  . Cyst of pancreas 01/11/2018  . Other specified disorders of kidney and ureter 01/11/2018  . Primary insomnia 01/11/2018  . Paroxysmal atrial fibrillation ([place]) 11/12/2017  . Essential hypertension 10/25/2017  . Mixed hyperlipidemia 10/25/2017  . Low back pain at  multiple sites 10/25/2017  . Dysuria 10/25/2017  . Encounter for general adult medical examination with abnormal findings 10/25/2017  . Vitamin D deficiency 10/25/2017  . Near syncope   . Symptomatic bradycardia 10/19/2017  . Carpal tunnel syndrome 10/12/2017  . Primary osteoarthritis of left knee 07/09/2017  . History of total knee arthroplasty 07/06/2017  . Chest pain 06/29/2017  . Postop check 02/25/2017  . Impingement syndrome of shoulder region 01/20/2017  . Neck pain 01/20/2017  . Obesity (BMI 35.0-39.9 without comorbidity) 07/15/2016  . Endometrial polyp 07/15/2016  . Morbid obesity ([place]) 07/15/2016  . Family history of breast cancer in first degree relative 07/15/2016  . Family history of ovarian cancer 07/15/2016  . Knee pain 07/04/2016  . Bilateral leg pain 06/24/2016    Blythe Stanford, PT DPT 07/03/2020, 2:18 PM  [facility] [address], [place], [address] Phone: [phone]   Fax:  [phone]  Name: TZIPORAH KNOKE MRN: [id number] Date of Birth: 1950/09/04

## 2020-07-04 DIAGNOSIS — M5416 Radiculopathy, lumbar region: Secondary | ICD-10-CM | POA: Diagnosis not present

## 2020-07-04 DIAGNOSIS — M48062 Spinal stenosis, lumbar region with neurogenic claudication: Secondary | ICD-10-CM | POA: Diagnosis not present

## 2020-07-04 DIAGNOSIS — M5136 Other intervertebral disc degeneration, lumbar region: Secondary | ICD-10-CM | POA: Diagnosis not present

## 2020-07-09 ENCOUNTER — Ambulatory Visit (INDEPENDENT_AMBULATORY_CARE_PROVIDER_SITE_OTHER): Payer: Medicare Other | Admitting: Nurse Practitioner

## 2020-07-09 ENCOUNTER — Other Ambulatory Visit: Payer: Self-pay

## 2020-07-09 ENCOUNTER — Encounter: Payer: Self-pay | Admitting: Nurse Practitioner

## 2020-07-09 VITALS — BP 128/101 | HR 81 | Temp 97.2°F | Resp 16 | Ht 65.0 in | Wt 240.2 lb

## 2020-07-09 DIAGNOSIS — B373 Candidiasis of vulva and vagina: Secondary | ICD-10-CM

## 2020-07-09 DIAGNOSIS — E782 Mixed hyperlipidemia: Secondary | ICD-10-CM

## 2020-07-09 DIAGNOSIS — J4520 Mild intermittent asthma, uncomplicated: Secondary | ICD-10-CM | POA: Diagnosis not present

## 2020-07-09 DIAGNOSIS — I700 Atherosclerosis of aorta: Secondary | ICD-10-CM | POA: Insufficient documentation

## 2020-07-09 DIAGNOSIS — R0602 Shortness of breath: Secondary | ICD-10-CM

## 2020-07-09 DIAGNOSIS — Z23 Encounter for immunization: Secondary | ICD-10-CM

## 2020-07-09 DIAGNOSIS — B3731 Acute candidiasis of vulva and vagina: Secondary | ICD-10-CM

## 2020-07-09 MED ORDER — ALBUTEROL SULFATE HFA 108 (90 BASE) MCG/ACT IN AERS: 2.0000 | INHALATION_SPRAY | RESPIRATORY_TRACT | 3 refills | Status: DC | PRN

## 2020-07-09 MED ORDER — EZETIMIBE 10 MG PO TABS: 10.0000 mg | ORAL_TABLET | Freq: Every day | ORAL | 3 refills | Status: DC

## 2020-07-09 MED ORDER — FLUCONAZOLE 150 MG PO TABS: ORAL_TABLET | ORAL | 0 refills | Status: DC

## 2020-07-09 MED ORDER — PNEUMOCOCCAL 13-VAL CONJ VACC IM SUSP: 0.5000 mL | Freq: Once | INTRAMUSCULAR | 0 refills | Status: AC

## 2020-07-09 NOTE — Progress Notes (Signed)
[facility] [place], [place] [address]  Internal MEDICINE  Office Visit Note  Patient Name: Whitney [name]  [id number]  [id number]  Date of Service: 08/08/2020  Chief Complaint  Patient presents with  . Follow-up    review [place], sob when walking, pt wants an inhaler, pt went to ER 06/27/20, discuss medications, experencing burning  . Hypertension  . Anxiety  . Depression  . policy update form    received  . Quality Metric Gaps    PNA, DEXA    The patient is here for follow up. She started having shortness of breath late in November, so bad that she had to go to ER due to shortness of breath. She had chest x-ray which was negative as was CTA of the chest. She did have a venous ultrasound of the right lower leg which does show a popliteal cyst, but no evidence of DVT or clot formation. CTA of the chest does show calcified coronary arteries and aortic atherosclerosis. She does see Dr. Ubaldo Glassing, cardiologist.  She was given a prescription for albuterol inhaler. She states that she is using this one to two times per day. She has had history of asthma in the past, but does not have a current prescription for rescue inhaler. She may be developing allergies or sensitivities ot fragrance.  The patient states that she does have some itching and burning when she urinates. A urine dip today is positive for large amount of protein. She does see nephrologist routinely.  Symptoms may be due to yeast infection as she has recently been on antibiotics.         Current Medication: Outpatient Encounter Medications as of 07/09/2020  Medication Sig Note  . amLODipine (NORVASC) 5 MG tablet Take 1 tablet (5 mg total) by mouth 2 (two) times daily.   Marland Kitchen aspirin EC 81 MG tablet Take 81 mg by mouth daily.    . carvedilol (COREG) 3.125 MG tablet TAKE 2 TABLETS(6.25 MG) BY MOUTH TWICE DAILY WITH A MEAL (Patient taking differently: Take 6.25 mg by mouth in the morning and at bedtime. )   .  Cholecalciferol (VITAMIN D) 50 MCG (2000 UT) tablet Take 2,000 Units by mouth in the morning and at bedtime.    . diclofenac Sodium (VOLTAREN) 1 % GEL Apply 1 application topically 4 (four) times daily as needed (pain).    . fluconazole (DIFLUCAN) 150 MG tablet Take 1 tab po every other day   . gabapentin (NEURONTIN) 100 MG capsule Take 100 mg by mouth 2 (two) times daily.   Marland Kitchen orlistat (ALLI) 60 MG capsule Take 60 mg by mouth 3 (three) times daily with meals.   Marland Kitchen OVER THE COUNTER MEDICATION Apply 1 application topically in the morning and at bedtime. Tummy cream   . rOPINIRole (REQUIP) 0.5 MG tablet Take 0.5-1 mg by mouth 2 (two) times daily as needed (pain).   . traMADol (ULTRAM) 50 MG tablet Take one tab po bid for pain   . [DISCONTINUED] albuterol (VENTOLIN HFA) 108 (90 Base) MCG/ACT inhaler Inhale 2-4 puffs by mouth every 4 hours as needed for wheezing, cough, and/or shortness of breath   . [DISCONTINUED] atorvastatin (LIPITOR) 40 MG tablet Take 1 tablet (40 mg total) by mouth daily at 6 PM. (Patient taking differently: Take 40 mg by mouth at bedtime. ) 07/09/2020: caused severe nausea.   . [DISCONTINUED] fluconazole (DIFLUCAN) 150 MG tablet Take 1 tab po every other day   . albuterol (VENTOLIN  HFA) 108 (90 Base) MCG/ACT inhaler Inhale 2 puffs into the lungs every 4 (four) hours as needed for wheezing or shortness of breath.   . ezetimibe (ZETIA) 10 MG tablet Take 1 tablet (10 mg total) by mouth daily.   . [EXPIRED] pneumococcal 13-valent conjugate vaccine (PREVNAR 13) SUSP injection Inject 0.5 mLs into the muscle once for 1 dose.    No facility-administered encounter medications on file as of 07/09/2020.    Surgical History: Past Surgical History:  Procedure Laterality Date  . BIOPSY  10/31/2019   Procedure: BIOPSY;  Surgeon: Rush Landmark Telford Nab., MD;  Location: [place];  Service: Gastroenterology;;  . CARDIAC CATHETERIZATION    . CHOLECYSTECTOMY    . COLONOSCOPY WITH PROPOFOL N/A  06/13/2019   Procedure: COLONOSCOPY WITH PROPOFOL;  Surgeon: [name], MD;  Location: [facility] ENDOSCOPY;  Service: Gastroenterology;  Laterality: N/A;  . COLONOSCOPY WITH PROPOFOL N/A 08/12/2019   Procedure: COLONOSCOPY WITH PROPOFOL;  Surgeon: [name], MD;  Location: [facility] ENDOSCOPY;  Service: Gastroenterology;  Laterality: N/A;  . COLONOSCOPY WITH PROPOFOL N/A 09/19/2019   Procedure: COLONOSCOPY WITH PROPOFOL;  Surgeon: [name], MD;  Location: [facility] ENDOSCOPY;  Service: Gastroenterology;  Laterality: N/A;  . DILATION AND CURETTAGE OF UTERUS    . ESOPHAGOGASTRODUODENOSCOPY (EGD) WITH PROPOFOL N/A 06/13/2019   Procedure: ESOPHAGOGASTRODUODENOSCOPY (EGD) WITH PROPOFOL;  Surgeon: [name], MD;  Location: [facility] ENDOSCOPY;  Service: Gastroenterology;  Laterality: N/A;  Pt will go for COVID test on 40-9-81 as she uses public transportation and will be in the area on that day.   . ESOPHAGOGASTRODUODENOSCOPY (EGD) WITH PROPOFOL N/A 10/31/2019   Procedure: ESOPHAGOGASTRODUODENOSCOPY (EGD) WITH PROPOFOL;  Surgeon: Rush Landmark Telford Nab., MD;  Location: [place];  Service: Gastroenterology;  Laterality: N/A;  . HEMOSTASIS CLIP PLACEMENT  10/31/2019   Procedure: HEMOSTASIS CLIP PLACEMENT;  Surgeon: Irving Copas., MD;  Location: [place];  Service: Gastroenterology;;  . HYSTEROSCOPY WITH D & C N/A 02/16/2017   Procedure: DILATATION AND CURETTAGE [place];  Surgeon: Brayton Mars, MD;  Location: [facility] ORS;  Service: Gynecology;  Laterality: N/A;  . JOINT REPLACEMENT    . kidney removal Left   . KIDNEY SURGERY Left 1979   left kidney removed  . LEFT HEART CATH AND CORONARY ANGIOGRAPHY N/A 10/21/2017   Procedure: LEFT HEART CATH AND CORONARY ANGIOGRAPHY;  Surgeon: Teodoro Spray, MD;  Location: [place] [facility];  Service: Cardiovascular;  Laterality: N/A;  . POLYPECTOMY  10/31/2019   Procedure: POLYPECTOMY;  Surgeon: Rush Landmark Telford Nab., MD;  Location: [place];   Service: Gastroenterology;;  . TOTAL KNEE ARTHROPLASTY Left 07/06/2017   Procedure: TOTAL KNEE ARTHROPLASTY;  Surgeon: Lovell Sheehan, MD;  Location: [facility];  Service: Orthopedics;  Laterality: Left;  . UPPER ESOPHAGEAL ENDOSCOPIC ULTRASOUND (EUS) N/A 10/31/2019   Procedure: UPPER ESOPHAGEAL ENDOSCOPIC ULTRASOUND (EUS);  Surgeon: Irving Copas., MD;  Location: [place];  Service: Gastroenterology;  Laterality: N/A;  . WRIST ARTHROSCOPY Left 1970s  . XI ROBOTIC ASSISTED VENTRAL HERNIA N/A 12/01/2019   Procedure: XI ROBOTIC ASSISTED VENTRAL HERNIA;  Surgeon: Jules Husbands, MD;  Location: [facility];  Service: General;  Laterality: N/A;    Medical History: Past Medical History:  Diagnosis Date  . Abnormal Pap smear of cervix    Pt states she had colposcopy   . Anxiety   . Arthritis   . Chronic kidney disease    removed left kidney  . Depression   . Diverticulitis   . Dyspnea   .  Dysrhythmia   . Hx of unilateral nephrectomy 1979   Left Nephrectomy  . Hypertension   . Lower extremity edema   . Palpitations   . PMB (postmenopausal bleeding)   . Restless leg syndrome   . Sleep apnea    has no cpap  . Vitamin D deficiency     Family History: Family History  Problem Relation Age of Onset  . Ovarian cancer Mother   . Hypertension Father   . Diabetes Father   . Cancer Father   . Breast cancer Sister   . Diabetes Sister     Social History   Socioeconomic History  . Marital status: Divorced    Spouse name: Not on file  . Number of children: Not on file  . Years of education: Not on file  . Highest education level: Not on file  Occupational History  . Not on file  Tobacco Use  . Smoking status: Never Smoker  . Smokeless tobacco: Never Used  Vaping Use  . Vaping Use: Never used  Substance and Sexual Activity  . Alcohol use: Yes    Alcohol/week: 2.0 standard drinks    Types: 2 Standard drinks or equivalent per week    Comment: occasionally  . Drug use:  Yes    Frequency: 3.0 times per week    Types: Marijuana, Cocaine    Comment: 07/09/20 Pt states she uses for leg pain: marijuana; states no longer uses cocaine  . Sexual activity: Yes    Birth control/protection: None  Other Topics Concern  . Not on file  Social History Narrative   Lives with cousin.   Social Determinants of Health   Financial Resource Strain: Not on file  Food Insecurity: Not on file  Transportation Needs: Not on file  Physical Activity: Not on file  Stress: Not on file  Social Connections: Not on file  Intimate Partner Violence: Not on file      Review of Systems  Constitutional: Positive for fatigue. Negative for activity change, chills and unexpected weight change.  HENT: Negative for congestion, postnasal drip, rhinorrhea, sneezing and sore throat.   Respiratory: Positive for shortness of breath and wheezing. Negative for cough and chest tightness.   Cardiovascular: Negative for chest pain and palpitations.       Elevated blood pressure today.   Gastrointestinal: Negative for abdominal pain, constipation, diarrhea, nausea and vomiting.  Endocrine: Negative for cold intolerance, heat intolerance, polydipsia and polyuria.  Genitourinary: Negative for dysuria and frequency.       Itching and burning around the urethra.   Musculoskeletal: Negative for arthralgias, back pain, joint swelling and neck pain.  Skin: Negative for rash.  Neurological: Negative for tremors, numbness and headaches.  Hematological: Negative for adenopathy. Does not bruise/bleed easily.  Psychiatric/Behavioral: Negative for behavioral problems (Depression), sleep disturbance and suicidal ideas. The patient is not nervous/anxious.    Today's Vitals   07/09/20 1002  BP: (!) 128/101  Pulse: 81  Resp: 16  Temp: (!) 97.2 F (36.2 C)  SpO2: 98%  Weight: 240 lb 3.2 oz (109 kg)  Height: 5\' 5"  (1.651 m)   Body mass index is 39.97 kg/m.  Physical Exam Vitals and nursing note  reviewed.  Constitutional:      General: She is not in acute distress.    Appearance: Normal appearance. She is well-developed and well-nourished. She is obese. She is not diaphoretic.  HENT:     Head: Normocephalic and atraumatic.     Mouth/Throat:  Mouth: Oropharynx is clear and moist.     Pharynx: No oropharyngeal exudate.  Eyes:     Extraocular Movements: EOM normal.     Pupils: Pupils are equal, round, and reactive to light.  Neck:     Thyroid: No thyromegaly.     Vascular: No carotid bruit or JVD.     Trachea: No tracheal deviation.  Cardiovascular:     Rate and Rhythm: Normal rate and regular rhythm.     Heart sounds: Normal heart sounds. No murmur heard. No friction rub. No gallop.   Pulmonary:     Effort: Pulmonary effort is normal. No respiratory distress.     Breath sounds: Normal breath sounds. No wheezing or rales.  Chest:     Chest wall: No tenderness.  Abdominal:     General: Bowel sounds are normal.     Palpations: Abdomen is soft.     Tenderness: There is no abdominal tenderness.  Musculoskeletal:        General: Normal range of motion.     Cervical back: Normal range of motion and neck supple.  Lymphadenopathy:     Cervical: No cervical adenopathy.  Skin:    General: Skin is warm and dry.  Neurological:     General: No focal deficit present.     Mental Status: She is alert and oriented to person, place, and time.     Cranial Nerves: No cranial nerve deficit.  Psychiatric:        Mood and Affect: Mood and affect and mood normal.        Behavior: Behavior normal.        Thought Content: Thought content normal.        Judgment: Judgment normal.    Assessment/Plan: 1. Mild intermittent asthma without complication continue to use rescue inhaler as needed and as prescribed. Will get pulmonary function test for further evaluation.   - Pulmonary Function Test; Future - albuterol (VENTOLIN HFA) 108 (90 Base) MCG/ACT inhaler; Inhale 2 puffs into the lungs  every 4 (four) hours as needed for wheezing or shortness of breath.  Dispense: 1 each; Refill: 3  2. Shortness of breath continue to use rescue inhaler as needed and as prescribed. Will get pulmonary function test for further evaluation.    3. Atherosclerosis of aorta (HCC) CTA of the chest showed calcified coronary arteries and aortic atherosclerosis. She does see Dr. Ubaldo Glassing, cardiologist. Continue zetia as prescribed  - ezetimibe (ZETIA) 10 MG tablet; Take 1 tablet (10 mg total) by mouth daily.  Dispense: 90 tablet; Refill: 3  4. Mixed hyperlipidemia  Continue zetia as prescribed  - ezetimibe (ZETIA) 10 MG tablet; Take 1 tablet (10 mg total) by mouth daily.  Dispense: 90 tablet; Refill: 3  5. Vaginal candidiasis Will have her take diflucan 150mg  every other day for total of three doses.  - fluconazole (DIFLUCAN) 150 MG tablet; Take 1 tab po every other day  Dispense: 3 tablet; Refill: 0  6. Need for vaccination against Streptococcus pneumoniae using pneumococcal conjugate vaccine 7 Prescription for prevnar 13 sent to her pharmacy for administration.  - pneumococcal 13-valent conjugate vaccine (PREVNAR 13) SUSP injection; Inject 0.5 mLs into the muscle once for 1 dose.  Dispense: 0.5 mL; Refill: 0  General Counseling: Sereen verbalizes understanding of the findings of todays visit and agrees with plan of treatment. I have discussed any further diagnostic evaluation that may be needed or ordered today. We also reviewed her medications today. she has been encouraged  to call the office with any questions or concerns that should arise related to todays visit.  This patient was seen by Leretha Pol FNP Collaboration with Dr Lavera Guise as a part of collaborative care agreement  Orders Placed This Encounter  Procedures  . Pulmonary Function Test    Meds ordered this encounter  Medications  . ezetimibe (ZETIA) 10 MG tablet    Sig: Take 1 tablet (10 mg total) by mouth daily.    Dispense:   90 tablet    Refill:  3    Please d/c atorvastatin due to nausea    Order Specific Question:   Supervising Provider    Answer:   Lavera Guise [place]  . fluconazole (DIFLUCAN) 150 MG tablet    Sig: Take 1 tab po every other day    Dispense:  3 tablet    Refill:  0    Order Specific Question:   Supervising Provider    Answer:   Lavera Guise [[id number]]  . albuterol (VENTOLIN HFA) 108 (90 Base) MCG/ACT inhaler    Sig: Inhale 2 puffs into the lungs every 4 (four) hours as needed for wheezing or shortness of breath.    Dispense:  1 each    Refill:  3    Order Specific Question:   Supervising Provider    Answer:   Lavera Guise [[id number]]  . pneumococcal 13-valent conjugate vaccine (PREVNAR 13) SUSP injection    Sig: Inject 0.5 mLs into the muscle once for 1 dose.    Dispense:  0.5 mL    Refill:  0    Order Specific Question:   Supervising Provider    Answer:   Lavera Guise [[id number]]    Total time spent: 40 Minutes   Time spent includes review of chart, medications, test results, and follow up plan with the patient.      Dr Lavera Guise Internal medicine

## 2020-07-10 ENCOUNTER — Ambulatory Visit: Payer: Medicare Other | Attending: Orthopedic Surgery

## 2020-07-10 VITALS — BP 156/90 | HR 71

## 2020-07-10 DIAGNOSIS — M25661 Stiffness of right knee, not elsewhere classified: Secondary | ICD-10-CM | POA: Insufficient documentation

## 2020-07-10 DIAGNOSIS — M6281 Muscle weakness (generalized): Secondary | ICD-10-CM | POA: Diagnosis not present

## 2020-07-10 DIAGNOSIS — G8929 Other chronic pain: Secondary | ICD-10-CM | POA: Diagnosis not present

## 2020-07-10 DIAGNOSIS — M25561 Pain in right knee: Secondary | ICD-10-CM | POA: Diagnosis not present

## 2020-07-10 NOTE — Therapy (Signed)
[facility] [address], [place], [address] Phone: [phone]   Fax:  [phone]  Physical Therapy Evaluation  Patient Details  Name: Whitney [name] MRN: [id number] Date of Birth: 18-Dec-1950 Referring Provider (PT): Kurtis Bushman MD   Encounter Date: 07/10/2020   PT End of Session - 07/10/20 1411    Visit Number 1    Number of Visits 16    Date for PT Re-Evaluation 09/04/20    Authorization Type [employer] MCR    Authorization Time Period 07/10/20-09/04/20    PT Start Time 1334    PT Stop Time 1414    PT Time Calculation (min) 40 min    Activity Tolerance Patient tolerated treatment well;Patient limited by pain    Behavior During Therapy [facility] for tasks assessed/performed           Past Medical History:  Diagnosis Date  . Abnormal Pap smear of cervix    Pt states she had colposcopy   . Anxiety   . Arthritis   . Chronic kidney disease    removed left kidney  . Depression   . Diverticulitis   . Dyspnea   . Dysrhythmia   . Hx of unilateral nephrectomy 1979   Left Nephrectomy  . Hypertension   . Lower extremity edema   . Palpitations   . PMB (postmenopausal bleeding)   . Restless leg syndrome   . Sleep apnea    has no cpap  . Vitamin D deficiency     Past Surgical History:  Procedure Laterality Date  . BIOPSY  10/31/2019   Procedure: BIOPSY;  Surgeon: Rush Landmark Telford Nab., MD;  Location: [place];  Service: Gastroenterology;;  . CARDIAC CATHETERIZATION    . CHOLECYSTECTOMY    . COLONOSCOPY WITH PROPOFOL N/A 06/13/2019   Procedure: COLONOSCOPY WITH PROPOFOL;  Surgeon: [name], MD;  Location: [facility] ENDOSCOPY;  Service: Gastroenterology;  Laterality: N/A;  . COLONOSCOPY WITH PROPOFOL N/A 08/12/2019   Procedure: COLONOSCOPY WITH PROPOFOL;  Surgeon: [name], MD;  Location: [facility] ENDOSCOPY;  Service: Gastroenterology;  Laterality: N/A;  . COLONOSCOPY WITH PROPOFOL N/A 09/19/2019   Procedure:  COLONOSCOPY WITH PROPOFOL;  Surgeon: [name], MD;  Location: [facility] ENDOSCOPY;  Service: Gastroenterology;  Laterality: N/A;  . DILATION AND CURETTAGE OF UTERUS    . ESOPHAGOGASTRODUODENOSCOPY (EGD) WITH PROPOFOL N/A 06/13/2019   Procedure: ESOPHAGOGASTRODUODENOSCOPY (EGD) WITH PROPOFOL;  Surgeon: [name], MD;  Location: [facility] ENDOSCOPY;  Service: Gastroenterology;  Laterality: N/A;  Pt will go for COVID test on 05-04-74 as she uses public transportation and will be in the area on that day.   . ESOPHAGOGASTRODUODENOSCOPY (EGD) WITH PROPOFOL N/A 10/31/2019   Procedure: ESOPHAGOGASTRODUODENOSCOPY (EGD) WITH PROPOFOL;  Surgeon: Rush Landmark Telford Nab., MD;  Location: [place];  Service: Gastroenterology;  Laterality: N/A;  . HEMOSTASIS CLIP PLACEMENT  10/31/2019   Procedure: HEMOSTASIS CLIP PLACEMENT;  Surgeon: Irving Copas., MD;  Location: [place];  Service: Gastroenterology;;  . HYSTEROSCOPY WITH D & C N/A 02/16/2017   Procedure: DILATATION AND CURETTAGE [place];  Surgeon: Brayton Mars, MD;  Location: [facility];  Service: Gynecology;  Laterality: N/A;  . JOINT REPLACEMENT    . kidney removal Left   . KIDNEY SURGERY Left 1979   left kidney removed  . LEFT HEART CATH AND CORONARY ANGIOGRAPHY N/A 10/21/2017   Procedure: LEFT HEART CATH AND CORONARY ANGIOGRAPHY;  Surgeon: Teodoro Spray, MD;  Location: [place] CV [facility];  Service: Cardiovascular;  Laterality: N/A;  . POLYPECTOMY  10/31/2019   Procedure: POLYPECTOMY;  Surgeon: Rush Landmark Telford Nab., MD;  Location: [place];  Service: Gastroenterology;;  . TOTAL KNEE ARTHROPLASTY Left 07/06/2017   Procedure: TOTAL KNEE ARTHROPLASTY;  Surgeon: Lovell Sheehan, MD;  Location: [facility];  Service: Orthopedics;  Laterality: Left;  . UPPER ESOPHAGEAL ENDOSCOPIC ULTRASOUND (EUS) N/A 10/31/2019   Procedure: UPPER ESOPHAGEAL ENDOSCOPIC ULTRASOUND (EUS);  Surgeon: Irving Copas., MD;  Location: [place];   Service: Gastroenterology;  Laterality: N/A;  . WRIST ARTHROSCOPY Left 1970s  . XI ROBOTIC ASSISTED VENTRAL HERNIA N/A 12/01/2019   Procedure: XI ROBOTIC ASSISTED VENTRAL HERNIA;  Surgeon: Jules Husbands, MD;  Location: [facility];  Service: General;  Laterality: N/A;    Vitals:   07/10/20 1337  BP: (!) 156/90  Pulse: 71  SpO2: 100%      Subjective Assessment - 07/10/20 1337    Subjective Pt presenting to PT for ongoing issues with Rt knee OA. She has been seen in OPPT for this issue, but therapies have been disrupted due to other health issues. Pt reports she has seen the MD again for SOB issues ongoing, now better s/p nebulizer treatment. Pt also recently had cholesterol meds updated, but she has not taken then yet    Pertinent History s/p Left TKA 07/06/2017; right knee arthritis with weakness as well with reports of right and left leg "giving way" intermittently; Going to have TKA 6/10    Limitations Walking;Standing;House hold activities;Other (comment)    How long can you stand comfortably? 2-3 hours    How long can you walk comfortably? 2-3 hours    Diagnostic tests imaging - see scanned docs    Patient Stated Goals Improve motion and strength of  hips and R knee and being able to walk with less/no pain.    Currently in Pain? Yes    Pain Score 3     Pain Location --   lateral foreleg   Pain Orientation Right              [facility] PT Assessment - 07/10/20 0001      Assessment   Medical Diagnosis Rt Knee DJD    Referring Provider (PT) Kurtis Bushman MD    Hand Dominance Right    Prior Therapy Yes      Precautions   Precautions Fall      Balance Screen   Has the patient fallen in the past 6 months No   does stumble periodically    Has the patient had a decrease in activity level because of a fear of falling?  No    Is the patient reluctant to leave their home because of a fear of falling?  No      Prior Function   Level of Independence Independent    Vocation Retired       Observation/Other Assessments   Focus on Therapeutic Outcomes (FOTO)  50 at eval; 55 on 10/20; 53 on 12/7      Sensation   Light Touch Appears Intact      AROM   Right Knee Extension 27    Right Knee Flexion 112    Left Knee Extension 0    Left Knee Flexion 112      Strength   Right Hip Flexion 5/5    Right Hip Extension 3/5    Right Hip External Rotation  5/5    Right Hip Internal Rotation 5/5    Right Hip ABduction 4-/5  Left Hip Flexion 5/5    Left Hip Extension 3/5    Left Hip External Rotation 5/5    Left Hip Internal Rotation 5/5    Left Hip ABduction 4/5    Right Knee Flexion 4+/5    Right Knee Extension 5/5    Left Knee Flexion 5/5    Left Knee Extension 5/5      Transfers   Five time sit to stand comments  11.76sec   12sec on 9/1     Standardized Balance Assessment   10 Meter Walk 1.36m/s    (2 trials); 1.38m/s          Intervention this date: -Rt SAQ over gray bolster 1x10x5secH 5lb AW; 1x15x5secH 5lbAW -Double striaght leg bridge, feet on stool 1x10x1secH   Objective measurements completed on examination: See above findings.       PT Education - 07/10/20 1411    Education provided Yes    Education Details need to improve TKE at rest, potential utility for compression sleeve.    Person(s) Educated Patient    Methods Explanation;Demonstration    Comprehension Verbalized understanding;Returned demonstration            PT Short Term Goals - 07/10/20 1420      PT SHORT TERM GOAL #1   Title Patient will demonstrate independence with HEP to maximize rehab potential.    Time 3    Period Weeks    Status Achieved    Target Date 04/25/20             PT Long Term Goals - 07/10/20 1420      PT LONG TERM GOAL #1   Title Patient will demonstrate independence with progressive HEP to maintain progress made during therapy.    Baseline needs updates to inlcude knee ext ROM    Time 6    Period Weeks    Status On-going    Target Date 08/07/20       PT LONG TERM GOAL #2   Title Patient will improve FOTO score to 58 to show an improvement in patients ability to perform functional activities.    Baseline 50 at Eval; 05/23/20: 55; 53 on 12/7;    Time 8    Period Weeks    Status On-going    Target Date 09/04/20      PT LONG TERM GOAL #3   Title Patient will have a worst pain of 3/10 to indicate significant improvement with pain and ability to perform functional activities more functionally.     Baseline 10/10; 05/23/20: 5/10    Time 8    Period Weeks    Status On-going    Target Date 09/04/20      PT LONG TERM GOAL #4   Title After 8 weeks pt will demonstrate tolerance of 6MWT c distance >1026ft c LRAD.    Baseline using SPC, intermittent stumbles    Time 8    Period Weeks    Status New    Target Date 09/04/20                  Plan - 07/10/20 1415    Clinical Impression Statement Reevaluation this date. Slight improvements in hip and knee strength bilat. Flexion ROM improved by 10 degrees, however Rt knee TKE ROM significnatly worse, only able to achieve ~26 degrees, easily correlated with LEE on right, visible at ankle, knee, and hip. Discussed potential utility of Rt gradede compresison garment for edema management. Improvements with 5xSTS  and 10MWT have been paintained. Pt will conitnued ot benefit from skill PT intervention to adress strength adn ROM deficits to improve tolernace to basic mobility required for ADL/IADL, and facilitate return to leisure and fitness activity.    Personal Factors and Comorbidities Age;Comorbidity 2    Comorbidities HBP, Obesity    Examination-Activity Limitations Stairs;Locomotion Level;Stand    Examination-Participation Restrictions Shop    Stability/Clinical Decision Making Evolving/Moderate complexity    Clinical Decision Making Moderate    Rehab Potential Good    Clinical Impairments Affecting Rehab Potential (+) motivated, chronic condition (-) arthritis right knee with pain      PT Frequency 2x / week    PT Duration 6 weeks    PT Treatment/Interventions Electrical Stimulation;Cryotherapy;Moist Heat;Gait training;Stair training;Therapeutic activities;Therapeutic exercise;Balance training;Patient/family education;Neuromuscular re-education;Manual techniques;Passive range of motion;Dry needling;Spinal Manipulations;Joint Manipulations;Functional mobility training;Traction    PT Next Visit Plan Monitor BP throughout session and continue POC; obtain a scribpt for graded compression RLE thigh high    PT Home Exercise Plan Heel Raises, standing hip abduction    Consulted and Agree with Plan of Care Patient           Patient will benefit from skilled therapeutic intervention in order to improve the following deficits and impairments:  Pain, Decreased activity tolerance, Decreased endurance, Decreased range of motion, Decreased strength, Impaired perceived functional ability, Difficulty walking, Decreased mobility, Obesity  Visit Diagnosis: Stiffness of right knee, not elsewhere classified  Muscle weakness (generalized)  Chronic pain of right knee     Problem List Patient Active Problem List   Diagnosis Date Noted  . Atherosclerosis of aorta ([place]) 07/09/2020  . Acute diverticulitis 05/13/2020  . Fever blister 10/15/2019  . Cerumen in auditory canal on examination 10/15/2019  . Bilateral hearing loss 10/15/2019  . Need for vaccination against Streptococcus pneumoniae using pneumococcal conjugate vaccine 13 10/15/2019  . Encounter for screening mammogram for malignant neoplasm of breast 10/15/2019  . Screening for osteoporosis 10/15/2019  . Elevated erythrocyte sedimentation rate 11/02/2018  . Lumbar spondylosis 11/02/2018  . PVC's (premature ventricular contractions) 10/18/2018  . Primary osteoarthritis of right knee 09/15/2018  . Encounter for pre-operative examination 09/15/2018  . Obstructive sleep apnea 09/15/2018  . Calculus of gallbladder without  cholecystitis without obstruction 09/15/2018  . Calculus of gallbladder with cholecystitis without biliary obstruction 01/27/2018  . Restless leg syndrome 01/27/2018  . Generalized abdominal pain 01/11/2018  . Cyst of pancreas 01/11/2018  . Other specified disorders of kidney and ureter 01/11/2018  . Primary insomnia 01/11/2018  . Paroxysmal atrial fibrillation ([place]) 11/12/2017  . Essential hypertension 10/25/2017  . Mixed hyperlipidemia 10/25/2017  . Low back pain at multiple sites 10/25/2017  . Dysuria 10/25/2017  . Encounter for general adult medical examination with abnormal findings 10/25/2017  . Vitamin D deficiency 10/25/2017  . Near syncope   . Symptomatic bradycardia 10/19/2017  . Carpal tunnel syndrome 10/12/2017  . Primary osteoarthritis of left knee 07/09/2017  . History of total knee arthroplasty 07/06/2017  . Chest pain 06/29/2017  . Postop check 02/25/2017  . Impingement syndrome of shoulder region 01/20/2017  . Neck pain 01/20/2017  . Obesity (BMI 35.0-39.9 without comorbidity) 07/15/2016  . Endometrial polyp 07/15/2016  . Morbid obesity ([place]) 07/15/2016  . Family history of breast cancer in first degree relative 07/15/2016  . Family history of ovarian cancer 07/15/2016  . Knee pain 07/04/2016  . Bilateral leg pain 06/24/2016   2:26 PM, 07/10/20 Etta Grandchild, PT, DPT Physical Therapist - [facility] [phone] (Office)    [place] C 07/10/2020, 2:25 PM  [facility] [address] [place], [address] Phone: [phone]   Fax:  [phone]  Name: Whitney [name] MRN: [id number] Date of Birth: 1951-02-15

## 2020-07-12 ENCOUNTER — Ambulatory Visit: Payer: Medicare Other

## 2020-07-12 ENCOUNTER — Other Ambulatory Visit: Payer: Self-pay

## 2020-07-12 DIAGNOSIS — G8929 Other chronic pain: Secondary | ICD-10-CM | POA: Diagnosis not present

## 2020-07-12 DIAGNOSIS — M25661 Stiffness of right knee, not elsewhere classified: Secondary | ICD-10-CM | POA: Diagnosis not present

## 2020-07-12 DIAGNOSIS — M25561 Pain in right knee: Secondary | ICD-10-CM | POA: Diagnosis not present

## 2020-07-12 DIAGNOSIS — M6281 Muscle weakness (generalized): Secondary | ICD-10-CM

## 2020-07-12 NOTE — Therapy (Signed)
[facility] [address] [place], [address] Phone: [phone]   Fax:  [phone]  Physical Therapy Treatment  Patient Details  Name: LAKASHA MCFALL MRN: [id number] Date of Birth: 05/21/1951 Referring Provider (PT): Kurtis Bushman MD   Encounter Date: 07/12/2020   PT End of Session - 07/12/20 1443    Visit Number 2    Number of Visits 16    Date for PT Re-Evaluation 09/04/20    Authorization Type UHC MCR    Authorization Time Period 07/10/20-09/04/20    PT Start Time 1415    PT Stop Time 1500    PT Time Calculation (min) 45 min    Activity Tolerance Patient tolerated treatment well;Patient limited by pain    Behavior During Therapy [facility] for tasks assessed/performed           Past Medical History:  Diagnosis Date  . Abnormal Pap smear of cervix    Pt states she had colposcopy   . Anxiety   . Arthritis   . Chronic kidney disease    removed left kidney  . Depression   . Diverticulitis   . Dyspnea   . Dysrhythmia   . Hx of unilateral nephrectomy 1979   Left Nephrectomy  . Hypertension   . Lower extremity edema   . Palpitations   . PMB (postmenopausal bleeding)   . Restless leg syndrome   . Sleep apnea    has no cpap  . Vitamin D deficiency     Past Surgical History:  Procedure Laterality Date  . BIOPSY  10/31/2019   Procedure: BIOPSY;  Surgeon: Rush Landmark Telford Nab., MD;  Location: [place];  Service: Gastroenterology;;  . CARDIAC CATHETERIZATION    . CHOLECYSTECTOMY    . COLONOSCOPY WITH PROPOFOL N/A 06/13/2019   Procedure: COLONOSCOPY WITH PROPOFOL;  Surgeon: [name], MD;  Location: [facility] ENDOSCOPY;  Service: Gastroenterology;  Laterality: N/A;  . COLONOSCOPY WITH PROPOFOL N/A 08/12/2019   Procedure: COLONOSCOPY WITH PROPOFOL;  Surgeon: [name], MD;  Location: [facility] ENDOSCOPY;  Service: Gastroenterology;  Laterality: N/A;  . COLONOSCOPY WITH PROPOFOL N/A 09/19/2019   Procedure:  COLONOSCOPY WITH PROPOFOL;  Surgeon: [name], MD;  Location: [facility] ENDOSCOPY;  Service: Gastroenterology;  Laterality: N/A;  . DILATION AND CURETTAGE OF UTERUS    . ESOPHAGOGASTRODUODENOSCOPY (EGD) WITH PROPOFOL N/A 06/13/2019   Procedure: ESOPHAGOGASTRODUODENOSCOPY (EGD) WITH PROPOFOL;  Surgeon: [name], MD;  Location: [facility] ENDOSCOPY;  Service: Gastroenterology;  Laterality: N/A;  Pt will go for COVID test on 04-10-27 as she uses public transportation and will be in the area on that day.   . ESOPHAGOGASTRODUODENOSCOPY (EGD) WITH PROPOFOL N/A 10/31/2019   Procedure: ESOPHAGOGASTRODUODENOSCOPY (EGD) WITH PROPOFOL;  Surgeon: Rush Landmark Telford Nab., MD;  Location: [place];  Service: Gastroenterology;  Laterality: N/A;  . HEMOSTASIS CLIP PLACEMENT  10/31/2019   Procedure: HEMOSTASIS CLIP PLACEMENT;  Surgeon: Irving Copas., MD;  Location: [place];  Service: Gastroenterology;;  . HYSTEROSCOPY WITH D & C N/A 02/16/2017   Procedure: DILATATION AND CURETTAGE [place];  Surgeon: Brayton Mars, MD;  Location: [facility];  Service: Gynecology;  Laterality: N/A;  . JOINT REPLACEMENT    . kidney removal Left   . KIDNEY SURGERY Left 1979   left kidney removed  . LEFT HEART CATH AND CORONARY ANGIOGRAPHY N/A 10/21/2017   Procedure: LEFT HEART CATH AND CORONARY ANGIOGRAPHY;  Surgeon: Teodoro Spray, MD;  Location: [facility];  Service: Cardiovascular;  Laterality: N/A;  . POLYPECTOMY  10/31/2019   Procedure: POLYPECTOMY;  Surgeon: Rush Landmark Telford Nab., MD;  Location: [place];  Service: Gastroenterology;;  . TOTAL KNEE ARTHROPLASTY Left 07/06/2017   Procedure: TOTAL KNEE ARTHROPLASTY;  Surgeon: Lovell Sheehan, MD;  Location: [facility];  Service: Orthopedics;  Laterality: Left;  . UPPER ESOPHAGEAL ENDOSCOPIC ULTRASOUND (EUS) N/A 10/31/2019   Procedure: UPPER ESOPHAGEAL ENDOSCOPIC ULTRASOUND (EUS);  Surgeon: Irving Copas., MD;  Location: [place];   Service: Gastroenterology;  Laterality: N/A;  . WRIST ARTHROSCOPY Left 1970s  . XI ROBOTIC ASSISTED VENTRAL HERNIA N/A 12/01/2019   Procedure: XI ROBOTIC ASSISTED VENTRAL HERNIA;  Surgeon: Jules Husbands, MD;  Location: [facility];  Service: General;  Laterality: N/A;    There were no vitals filed for this visit.   Subjective Assessment - 07/12/20 1435    Subjective Patient states curiousity about receiving a compression garment for her LE. Patient states she's been feeling well overall.    Pertinent History s/p Left TKA 07/06/2017; right knee arthritis with weakness as well with reports of right and left leg "giving way" intermittently; Going to have TKA 6/10    Limitations Walking;Standing;House hold activities;Other (comment)    How long can you stand comfortably? 2-3 hours    How long can you walk comfortably? 2-3 hours    Diagnostic tests imaging - see scanned docs    Patient Stated Goals Improve motion and strength of  hips and R knee and being able to walk with less/no pain.    Currently in Pain? Yes    Pain Score 3     Pain Location Knee    Pain Orientation Right    Pain Descriptors / Indicators Aching    Pain Type Acute pain    Pain Onset More than a month ago    Pain Frequency Intermittent                   TREATMENT Therapeutic Exercise Leg press in sitting with use of OMEGA -   3 x 10 85#  Nustep in sitting level 3 for 66min Knee Extension at OMEGA - 3 x 10 35# Deadlifts in standing - x5 ; 30# KB   Performed exercises to improve LE strength       PT Education - 07/12/20 1442    Education provided Yes    Education Details form/technique with exercise; progression with exercise performance    Person(s) Educated Patient    Methods Explanation;Demonstration    Comprehension Verbalized understanding;Returned demonstration            PT Short Term Goals - 07/10/20 1420      PT SHORT TERM GOAL #1   Title Patient will demonstrate independence with HEP to  maximize rehab potential.    Time 3    Period Weeks    Status Achieved    Target Date 04/25/20             PT Long Term Goals - 07/10/20 1420      PT LONG TERM GOAL #1   Title Patient will demonstrate independence with progressive HEP to maintain progress made during therapy.    Baseline needs updates to inlcude knee ext ROM    Time 6    Period Weeks    Status On-going    Target Date 08/07/20      PT LONG TERM GOAL #2   Title Patient will improve FOTO score to 58 to show an improvement in patients  ability to perform functional activities.    Baseline 50 at Eval; 05/23/20: 55; 53 on 12/7;    Time 8    Period Weeks    Status On-going    Target Date 09/04/20      PT LONG TERM GOAL #3   Title Patient will have a worst pain of 3/10 to indicate significant improvement with pain and ability to perform functional activities more functionally.     Baseline 10/10; 05/23/20: 5/10    Time 8    Period Weeks    Status On-going    Target Date 09/04/20      PT LONG TERM GOAL #4   Title After 8 weeks pt will demonstrate tolerance of 6MWT c distance >1090ft c LRAD.    Baseline using SPC, intermittent stumbles    Time 8    Period Weeks    Status New    Target Date 09/04/20                 Plan - 07/12/20 1444    Clinical Impression Statement Continued to focus on progressing LE strength and AROM throughout the session today. Continued to progress TKE throughout the session today. Patient continues to demonstrate poor endurance with exercises performance requiring sitting rest breaks to return to baseline. Patient will benefit from further skilled therapy to return to prior level of function.    Personal Factors and Comorbidities Age;Comorbidity 2    Comorbidities HBP, Obesity    Examination-Activity Limitations Stairs;Locomotion Level;Stand    Examination-Participation Restrictions Shop    Stability/Clinical Decision Making Evolving/Moderate complexity    Rehab Potential  Good    Clinical Impairments Affecting Rehab Potential (+) motivated, chronic condition (-) arthritis right knee with pain    PT Frequency 2x / week    PT Duration 6 weeks    PT Treatment/Interventions Electrical Stimulation;Cryotherapy;Moist Heat;Gait training;Stair training;Therapeutic activities;Therapeutic exercise;Balance training;Patient/family education;Neuromuscular re-education;Manual techniques;Passive range of motion;Dry needling;Spinal Manipulations;Joint Manipulations;Functional mobility training;Traction    PT Next Visit Plan Monitor BP throughout session and continue POC; obtain a scribpt for graded compression RLE thigh high    PT Home Exercise Plan Heel Raises, standing hip abduction    Consulted and Agree with Plan of Care Patient           Patient will benefit from skilled therapeutic intervention in order to improve the following deficits and impairments:  Pain,Decreased activity tolerance,Decreased endurance,Decreased range of motion,Decreased strength,Impaired perceived functional ability,Difficulty walking,Decreased mobility,Obesity  Visit Diagnosis: Stiffness of right knee, not elsewhere classified  Muscle weakness (generalized)  Chronic pain of right knee     Problem List Patient Active Problem List   Diagnosis Date Noted  . Atherosclerosis of aorta ([place]) 07/09/2020  . Acute diverticulitis 05/13/2020  . Fever blister 10/15/2019  . Cerumen in auditory canal on examination 10/15/2019  . Bilateral hearing loss 10/15/2019  . Need for vaccination against Streptococcus pneumoniae using pneumococcal conjugate vaccine 13 10/15/2019  . Encounter for screening mammogram for malignant neoplasm of breast 10/15/2019  . Screening for osteoporosis 10/15/2019  . Elevated erythrocyte sedimentation rate 11/02/2018  . Lumbar spondylosis 11/02/2018  . PVC's (premature ventricular contractions) 10/18/2018  . Primary osteoarthritis of right knee 09/15/2018  . Encounter for  pre-operative examination 09/15/2018  . Obstructive sleep apnea 09/15/2018  . Calculus of gallbladder without cholecystitis without obstruction 09/15/2018  . Calculus of gallbladder with cholecystitis without biliary obstruction 01/27/2018  . Restless leg syndrome 01/27/2018  . Generalized abdominal pain 01/11/2018  . Cyst of pancreas  01/11/2018  . Other specified disorders of kidney and ureter 01/11/2018  . Primary insomnia 01/11/2018  . Paroxysmal atrial fibrillation ([place]) 11/12/2017  . Essential hypertension 10/25/2017  . Mixed hyperlipidemia 10/25/2017  . Low back pain at multiple sites 10/25/2017  . Dysuria 10/25/2017  . Encounter for general adult medical examination with abnormal findings 10/25/2017  . Vitamin D deficiency 10/25/2017  . Near syncope   . Symptomatic bradycardia 10/19/2017  . Carpal tunnel syndrome 10/12/2017  . Primary osteoarthritis of left knee 07/09/2017  . History of total knee arthroplasty 07/06/2017  . Chest pain 06/29/2017  . Postop check 02/25/2017  . Impingement syndrome of shoulder region 01/20/2017  . Neck pain 01/20/2017  . Obesity (BMI 35.0-39.9 without comorbidity) 07/15/2016  . Endometrial polyp 07/15/2016  . Morbid obesity ([place]) 07/15/2016  . Family history of breast cancer in first degree relative 07/15/2016  . Family history of ovarian cancer 07/15/2016  . Knee pain 07/04/2016  . Bilateral leg pain 06/24/2016    Blythe Stanford, PT DPT 07/12/2020, 3:03 PM  [facility] [address] [place], [address] Phone: [phone]   Fax:  [phone]  Name: HOORIA GASPARINI MRN: [id number] Date of Birth: 1951/06/30

## 2020-07-17 ENCOUNTER — Ambulatory Visit: Payer: Medicare Other

## 2020-07-19 ENCOUNTER — Ambulatory Visit: Payer: Medicare Other

## 2020-07-19 ENCOUNTER — Other Ambulatory Visit: Payer: Self-pay

## 2020-07-19 DIAGNOSIS — G8929 Other chronic pain: Secondary | ICD-10-CM | POA: Diagnosis not present

## 2020-07-19 DIAGNOSIS — M25661 Stiffness of right knee, not elsewhere classified: Secondary | ICD-10-CM | POA: Diagnosis not present

## 2020-07-19 DIAGNOSIS — M25561 Pain in right knee: Secondary | ICD-10-CM | POA: Diagnosis not present

## 2020-07-19 DIAGNOSIS — M6281 Muscle weakness (generalized): Secondary | ICD-10-CM | POA: Diagnosis not present

## 2020-07-19 NOTE — Therapy (Signed)
[facility] [address], [place], [address] Phone: [phone]   Fax:  [phone]  Physical Therapy Treatment  Patient Details  Name: Whitney [name] MRN: [id number] Date of Birth: 1951-02-04 Referring Provider (PT): Kurtis Bushman MD   Encounter Date: 07/19/2020   PT End of Session - 07/19/20 1454    Visit Number 3    Number of Visits 16    Date for PT Re-Evaluation 09/04/20    Authorization Type UHC MCR    Authorization Time Period 07/10/20-09/04/20    PT Start Time 1430    PT Stop Time 1515    PT Time Calculation (min) 45 min    Activity Tolerance Patient tolerated treatment well;Patient limited by pain    Behavior During Therapy [facility] for tasks assessed/performed           Past Medical History:  Diagnosis Date  . Abnormal Pap smear of cervix    Pt states she had colposcopy   . Anxiety   . Arthritis   . Chronic kidney disease    removed left kidney  . Depression   . Diverticulitis   . Dyspnea   . Dysrhythmia   . Hx of unilateral nephrectomy 1979   Left Nephrectomy  . Hypertension   . Lower extremity edema   . Palpitations   . PMB (postmenopausal bleeding)   . Restless leg syndrome   . Sleep apnea    has no cpap  . Vitamin D deficiency     Past Surgical History:  Procedure Laterality Date  . BIOPSY  10/31/2019   Procedure: BIOPSY;  Surgeon: Rush Landmark Telford Nab., MD;  Location: [place];  Service: Gastroenterology;;  . CARDIAC CATHETERIZATION    . CHOLECYSTECTOMY    . COLONOSCOPY WITH PROPOFOL N/A 06/13/2019   Procedure: COLONOSCOPY WITH PROPOFOL;  Surgeon: [name], MD;  Location: [facility] ENDOSCOPY;  Service: Gastroenterology;  Laterality: N/A;  . COLONOSCOPY WITH PROPOFOL N/A 08/12/2019   Procedure: COLONOSCOPY WITH PROPOFOL;  Surgeon: [name], MD;  Location: [facility] ENDOSCOPY;  Service: Gastroenterology;  Laterality: N/A;  . COLONOSCOPY WITH PROPOFOL N/A 09/19/2019   Procedure:  COLONOSCOPY WITH PROPOFOL;  Surgeon: [name], MD;  Location: [facility] ENDOSCOPY;  Service: Gastroenterology;  Laterality: N/A;  . DILATION AND CURETTAGE OF UTERUS    . ESOPHAGOGASTRODUODENOSCOPY (EGD) WITH PROPOFOL N/A 06/13/2019   Procedure: ESOPHAGOGASTRODUODENOSCOPY (EGD) WITH PROPOFOL;  Surgeon: [name], MD;  Location: [facility] ENDOSCOPY;  Service: Gastroenterology;  Laterality: N/A;  Pt will go for COVID test on 23-5-36 as she uses public transportation and will be in the area on that day.   . ESOPHAGOGASTRODUODENOSCOPY (EGD) WITH PROPOFOL N/A 10/31/2019   Procedure: ESOPHAGOGASTRODUODENOSCOPY (EGD) WITH PROPOFOL;  Surgeon: Rush Landmark Telford Nab., MD;  Location: [place];  Service: Gastroenterology;  Laterality: N/A;  . HEMOSTASIS CLIP PLACEMENT  10/31/2019   Procedure: HEMOSTASIS CLIP PLACEMENT;  Surgeon: Irving Copas., MD;  Location: [place];  Service: Gastroenterology;;  . HYSTEROSCOPY WITH D & C N/A 02/16/2017   Procedure: DILATATION AND CURETTAGE [place];  Surgeon: Brayton Mars, MD;  Location: [facility];  Service: Gynecology;  Laterality: N/A;  . JOINT REPLACEMENT    . kidney removal Left   . KIDNEY SURGERY Left 1979   left kidney removed  . LEFT HEART CATH AND CORONARY ANGIOGRAPHY N/A 10/21/2017   Procedure: LEFT HEART CATH AND CORONARY ANGIOGRAPHY;  Surgeon: Teodoro Spray, MD;  Location: [place] CV LAB;  Service: Cardiovascular;  Laterality: N/A;  . POLYPECTOMY  10/31/2019   Procedure: POLYPECTOMY;  Surgeon: Rush Landmark Telford Nab., MD;  Location: [place];  Service: Gastroenterology;;  . TOTAL KNEE ARTHROPLASTY Left 07/06/2017   Procedure: TOTAL KNEE ARTHROPLASTY;  Surgeon: Lovell Sheehan, MD;  Location: [facility];  Service: Orthopedics;  Laterality: Left;  . UPPER ESOPHAGEAL ENDOSCOPIC ULTRASOUND (EUS) N/A 10/31/2019   Procedure: UPPER ESOPHAGEAL ENDOSCOPIC ULTRASOUND (EUS);  Surgeon: Irving Copas., MD;  Location: [place];   Service: Gastroenterology;  Laterality: N/A;  . WRIST ARTHROSCOPY Left 1970s  . XI ROBOTIC ASSISTED VENTRAL HERNIA N/A 12/01/2019   Procedure: XI ROBOTIC ASSISTED VENTRAL HERNIA;  Surgeon: Jules Husbands, MD;  Location: [facility];  Service: General;  Laterality: N/A;    There were no vitals filed for this visit.   Subjective Assessment - 07/19/20 1447    Subjective Patient reports she called the cardiologist about potential use of compression garmet and reports the cardiologist would contact the PT to address potential complications with her BP.    Pertinent History s/p Left TKA 07/06/2017; right knee arthritis with weakness as well with reports of right and left leg "giving way" intermittently; Going to have TKA 6/10    Limitations Walking;Standing;House hold activities;Other (comment)    How long can you stand comfortably? 2-3 hours    How long can you walk comfortably? 2-3 hours    Diagnostic tests imaging - see scanned docs    Patient Stated Goals Improve motion and strength of  hips and R knee and being able to walk with less/no pain.    Currently in Pain? No/denies    Pain Onset More than a month ago                  TREATMENT Therapeutic Exercise Leg press in sitting with use of OMEGA -   3 x 10 95# ; x10 105# Nustep in sitting level 3 for 68min to address ABLA and improve LE strength and endurance  Deadlifts in standing - 2x5 ; 20# KB Kettlebell swings in standing 2 x 5 20# KB   Performed exercises to improve LE strength       PT Education - 07/19/20 1454    Education provided Yes    Education Details form/technique with exercise    Person(s) Educated Patient    Methods Explanation;Demonstration    Comprehension Verbalized understanding;Returned demonstration            PT Short Term Goals - 07/10/20 1420      PT SHORT TERM GOAL #1   Title Patient will demonstrate independence with HEP to maximize rehab potential.    Time 3    Period Weeks    Status Achieved     Target Date 04/25/20             PT Long Term Goals - 07/10/20 1420      PT LONG TERM GOAL #1   Title Patient will demonstrate independence with progressive HEP to maintain progress made during therapy.    Baseline needs updates to inlcude knee ext ROM    Time 6    Period Weeks    Status On-going    Target Date 08/07/20      PT LONG TERM GOAL #2   Title Patient will improve FOTO score to 58 to show an improvement in patients ability to perform functional activities.    Baseline 50 at Eval; 05/23/20: 55; 53 on 12/7;    Time 8  Period Weeks    Status On-going    Target Date 09/04/20      PT LONG TERM GOAL #3   Title Patient will have a worst pain of 3/10 to indicate significant improvement with pain and ability to perform functional activities more functionally.     Baseline 10/10; 05/23/20: 5/10    Time 8    Period Weeks    Status On-going    Target Date 09/04/20      PT LONG TERM GOAL #4   Title After 8 weeks pt will demonstrate tolerance of 6MWT c distance >108ft c LRAD.    Baseline using SPC, intermittent stumbles    Time 8    Period Weeks    Status New    Target Date 09/04/20                 Plan - 07/19/20 1455    Clinical Impression Statement Patient's BP 145/37mmHg and continued to progress exercises and improve LE strength allow for improvement with ADLs such as walkign for prolonged periods of time and going from sit to standing with less overall effort. Patient is improving overall, however continues to have difficulty with increased fatigue after performing Compound LE exercises. Patient will benefit from further skilled therapy to return to prior level of function.    Personal Factors and Comorbidities Age;Comorbidity 2    Comorbidities HBP, Obesity    Examination-Activity Limitations Stairs;Locomotion Level;Stand    Examination-Participation Restrictions Shop    Stability/Clinical Decision Making Evolving/Moderate complexity    Rehab  Potential Good    Clinical Impairments Affecting Rehab Potential (+) motivated, chronic condition (-) arthritis right knee with pain    PT Frequency 2x / week    PT Duration 6 weeks    PT Treatment/Interventions Electrical Stimulation;Cryotherapy;Moist Heat;Gait training;Stair training;Therapeutic activities;Therapeutic exercise;Balance training;Patient/family education;Neuromuscular re-education;Manual techniques;Passive range of motion;Dry needling;Spinal Manipulations;Joint Manipulations;Functional mobility training;Traction    PT Next Visit Plan Monitor BP throughout session and continue POC; obtain a scribpt for graded compression RLE thigh high    PT Home Exercise Plan Heel Raises, standing hip abduction    Consulted and Agree with Plan of Care Patient           Patient will benefit from skilled therapeutic intervention in order to improve the following deficits and impairments:  Pain,Decreased activity tolerance,Decreased endurance,Decreased range of motion,Decreased strength,Impaired perceived functional ability,Difficulty walking,Decreased mobility,Obesity  Visit Diagnosis: Stiffness of right knee, not elsewhere classified  Muscle weakness (generalized)     Problem List Patient Active Problem List   Diagnosis Date Noted  . Atherosclerosis of aorta ([place]) 07/09/2020  . Acute diverticulitis 05/13/2020  . Fever blister 10/15/2019  . Cerumen in auditory canal on examination 10/15/2019  . Bilateral hearing loss 10/15/2019  . Need for vaccination against Streptococcus pneumoniae using pneumococcal conjugate vaccine 13 10/15/2019  . Encounter for screening mammogram for malignant neoplasm of breast 10/15/2019  . Screening for osteoporosis 10/15/2019  . Elevated erythrocyte sedimentation rate 11/02/2018  . Lumbar spondylosis 11/02/2018  . PVC's (premature ventricular contractions) 10/18/2018  . Primary osteoarthritis of right knee 09/15/2018  . Encounter for pre-operative  examination 09/15/2018  . Obstructive sleep apnea 09/15/2018  . Calculus of gallbladder without cholecystitis without obstruction 09/15/2018  . Calculus of gallbladder with cholecystitis without biliary obstruction 01/27/2018  . Restless leg syndrome 01/27/2018  . Generalized abdominal pain 01/11/2018  . Cyst of pancreas 01/11/2018  . Other specified disorders of kidney and ureter 01/11/2018  . Primary insomnia 01/11/2018  .  Paroxysmal atrial fibrillation ([place]) 11/12/2017  . Essential hypertension 10/25/2017  . Mixed hyperlipidemia 10/25/2017  . Low back pain at multiple sites 10/25/2017  . Dysuria 10/25/2017  . Encounter for general adult medical examination with abnormal findings 10/25/2017  . Vitamin D deficiency 10/25/2017  . Near syncope   . Symptomatic bradycardia 10/19/2017  . Carpal tunnel syndrome 10/12/2017  . Primary osteoarthritis of left knee 07/09/2017  . History of total knee arthroplasty 07/06/2017  . Chest pain 06/29/2017  . Postop check 02/25/2017  . Impingement syndrome of shoulder region 01/20/2017  . Neck pain 01/20/2017  . Obesity (BMI 35.0-39.9 without comorbidity) 07/15/2016  . Endometrial polyp 07/15/2016  . Morbid obesity ([place]) 07/15/2016  . Family history of breast cancer in first degree relative 07/15/2016  . Family history of ovarian cancer 07/15/2016  . Knee pain 07/04/2016  . Bilateral leg pain 06/24/2016    Blythe Stanford, PT DPT 07/19/2020, 3:17 PM  [facility] [address], [place], [address] Phone: [phone]   Fax:  [phone]  Name: JOANETTE SILVERIA MRN: [id number] Date of Birth: 06-Dec-1950

## 2020-07-24 ENCOUNTER — Other Ambulatory Visit: Payer: Self-pay

## 2020-07-24 ENCOUNTER — Ambulatory Visit: Payer: Medicare Other

## 2020-07-24 DIAGNOSIS — M25661 Stiffness of right knee, not elsewhere classified: Secondary | ICD-10-CM

## 2020-07-24 DIAGNOSIS — M6281 Muscle weakness (generalized): Secondary | ICD-10-CM

## 2020-07-24 NOTE — Therapy (Signed)
[place] [facility] [address], [place], [address] Phone: [phone]   Fax:  [phone]  Patient Details  Name: Whitney [name] MRN: [id number] Date of Birth: [birth year] Referring Provider:  Lovell Sheehan, MD  Encounter Date: 07/24/2020  Attempted therapy during today's session. Patient's BP between 164/80- 180/85 before the start of the session. Did not perform intervention as to not raise BP further.   Patient non-symptomatic during the entirety of the session. Educated patient to monitor for symptoms of HTN and MI as necessary.    Blythe Stanford, PT DPT 07/24/2020, 2:15 PM  [facility] [address], [place], [address] Phone: [phone]   Fax:  [phone]

## 2020-07-25 ENCOUNTER — Ambulatory Visit (INDEPENDENT_AMBULATORY_CARE_PROVIDER_SITE_OTHER): Payer: Medicare Other | Admitting: Internal Medicine

## 2020-07-25 DIAGNOSIS — J4520 Mild intermittent asthma, uncomplicated: Secondary | ICD-10-CM

## 2020-07-25 LAB — PULMONARY FUNCTION TEST

## 2020-08-01 NOTE — Progress Notes (Signed)
Mild obstructive lung disease. Discuss with patient at visit 1/11

## 2020-08-01 NOTE — Procedures (Signed)
[facility] [address] [place], [address]  DATE OF SERVICE: July 25, 2020  Complete Pulmonary Function Testing Interpretation:  FINDINGS:  The forced vital capacity is moderately decreased the FEV1 is 1.4 L which is 71% of predicted and is mildly decreased.  FEV1 FVC ratio is increased.  Postbronchodilator there was significant improvement in the FEV1.  Total lung capacity is severely decreased residual volume is decreased residual volume total lung capacity ratio is decreased FRC is decreased.  DLCO was mildly decreased and normalized when corrected for alveolar volume.  IMPRESSION:  This pulmonary function study is consistent with mild obstructive lung disease.  The reduction in the total lung capacity may be function of technique  Yevonne Pax, MD [facility] Pulmonary Critical Care Medicine Sleep Medicine

## 2020-08-02 ENCOUNTER — Ambulatory Visit: Payer: Medicare Other

## 2020-08-02 ENCOUNTER — Other Ambulatory Visit: Payer: Self-pay

## 2020-08-02 DIAGNOSIS — M25661 Stiffness of right knee, not elsewhere classified: Secondary | ICD-10-CM

## 2020-08-02 DIAGNOSIS — M6281 Muscle weakness (generalized): Secondary | ICD-10-CM

## 2020-08-02 NOTE — Therapy (Signed)
[facility] [address] [place], [address] Phone: [phone]   Fax:  [phone]  Patient Details  Name: Whitney [name] MRN: [id number] Date of Birth: [birth year] Referring Provider:  Lyndle Herrlich, MD  Encounter Date: 08/02/2020   Patient not seen during this visit secondary to elevated BP from 174/94 - 173/94. Educated patient to contact physician. Patient reports and demonstrates no adverse symptoms.   Myrene Galas, PT DPT 08/02/2020, 3:22 PM  [facility] PHYSICAL AND SPORTS MEDICINE [address] [place], [address] Phone: [phone]   Fax:  [phone]

## 2020-08-06 ENCOUNTER — Ambulatory Visit: Payer: Medicare Other

## 2020-08-08 ENCOUNTER — Encounter: Payer: Self-pay | Admitting: Nurse Practitioner

## 2020-08-08 DIAGNOSIS — B373 Candidiasis of vulva and vagina: Secondary | ICD-10-CM | POA: Insufficient documentation

## 2020-08-08 DIAGNOSIS — J4520 Mild intermittent asthma, uncomplicated: Secondary | ICD-10-CM | POA: Insufficient documentation

## 2020-08-08 DIAGNOSIS — B3731 Acute candidiasis of vulva and vagina: Secondary | ICD-10-CM | POA: Insufficient documentation

## 2020-08-08 DIAGNOSIS — R0602 Shortness of breath: Secondary | ICD-10-CM | POA: Insufficient documentation

## 2020-08-09 ENCOUNTER — Ambulatory Visit: Payer: Medicare Other | Attending: Orthopedic Surgery

## 2020-08-09 ENCOUNTER — Other Ambulatory Visit: Payer: Self-pay

## 2020-08-09 DIAGNOSIS — M6281 Muscle weakness (generalized): Secondary | ICD-10-CM | POA: Insufficient documentation

## 2020-08-09 DIAGNOSIS — M25552 Pain in left hip: Secondary | ICD-10-CM | POA: Insufficient documentation

## 2020-08-09 DIAGNOSIS — G8929 Other chronic pain: Secondary | ICD-10-CM | POA: Insufficient documentation

## 2020-08-09 DIAGNOSIS — M25661 Stiffness of right knee, not elsewhere classified: Secondary | ICD-10-CM | POA: Insufficient documentation

## 2020-08-09 DIAGNOSIS — M25561 Pain in right knee: Secondary | ICD-10-CM | POA: Insufficient documentation

## 2020-08-09 NOTE — Therapy (Signed)
[facility]INE [address], [place], [address] Phone: [phone]   Fax:  [phone]  Physical Therapy Treatment  Patient Details  Name: Whitney [name] MRN: [id number] Date of Birth: 1950/11/26 Referring Provider (PT): Cassell Smiles MD   Encounter Date: 08/09/2020   PT End of Session - 08/09/20 1442    Visit Number 6    Number of Visits 16    Date for PT Re-Evaluation 09/04/20    Authorization Type [employer] MCR    Authorization Time Period 07/10/20-09/04/20    PT Start Time 1430    PT Stop Time 1515    PT Time Calculation (min) 45 min    Activity Tolerance Patient tolerated treatment well;Patient limited by pain    Behavior During Therapy [facility] for tasks assessed/performed           Past Medical History:  Diagnosis Date  . Abnormal Pap smear of cervix    Pt states she had colposcopy   . Anxiety   . Arthritis   . Chronic kidney disease    removed left kidney  . Depression   . Diverticulitis   . Dyspnea   . Dysrhythmia   . Hx of unilateral nephrectomy 1979   Left Nephrectomy  . Hypertension   . Lower extremity edema   . Palpitations   . PMB (postmenopausal bleeding)   . Restless leg syndrome   . Sleep apnea    has no cpap  . Vitamin D deficiency     Past Surgical History:  Procedure Laterality Date  . BIOPSY  10/31/2019   Procedure: BIOPSY;  Surgeon: Meridee Score Netty Starring., MD;  Location: [facility] ENDOSCOPY;  Service: Gastroenterology;;  . CARDIAC CATHETERIZATION    . CHOLECYSTECTOMY    . COLONOSCOPY WITH PROPOFOL N/A 06/13/2019   Procedure: COLONOSCOPY WITH PROPOFOL;  Surgeon: Wyline Mood, MD;  Location: [facility] ENDOSCOPY;  Service: Gastroenterology;  Laterality: N/A;  . COLONOSCOPY WITH PROPOFOL N/A 08/12/2019   Procedure: COLONOSCOPY WITH PROPOFOL;  Surgeon: Wyline Mood, MD;  Location: [facility] ENDOSCOPY;  Service: Gastroenterology;  Laterality: N/A;  . COLONOSCOPY WITH PROPOFOL N/A 09/19/2019   Procedure:  COLONOSCOPY WITH PROPOFOL;  Surgeon: Wyline Mood, MD;  Location: [facility] ENDOSCOPY;  Service: Gastroenterology;  Laterality: N/A;  . DILATION AND CURETTAGE OF UTERUS    . ESOPHAGOGASTRODUODENOSCOPY (EGD) WITH PROPOFOL N/A 06/13/2019   Procedure: ESOPHAGOGASTRODUODENOSCOPY (EGD) WITH PROPOFOL;  Surgeon: Wyline Mood, MD;  Location: [facility] ENDOSCOPY;  Service: Gastroenterology;  Laterality: N/A;  Pt will go for COVID test on 06-08-19 as she uses public transportation and will be in the area on that day.   . ESOPHAGOGASTRODUODENOSCOPY (EGD) WITH PROPOFOL N/A 10/31/2019   Procedure: ESOPHAGOGASTRODUODENOSCOPY (EGD) WITH PROPOFOL;  Surgeon: Meridee Score Netty Starring., MD;  Location: [facility] ENDOSCOPY;  Service: Gastroenterology;  Laterality: N/A;  . HEMOSTASIS CLIP PLACEMENT  10/31/2019   Procedure: HEMOSTASIS CLIP PLACEMENT;  Surgeon: Lemar Lofty., MD;  Location: [facility] ENDOSCOPY;  Service: Gastroenterology;;  . HYSTEROSCOPY WITH D & C N/A 02/16/2017   Procedure: DILATATION AND CURETTAGE Melton Krebs;  Surgeon: Herold Harms, MD;  Location: [facility] ORS;  Service: Gynecology;  Laterality: N/A;  . JOINT REPLACEMENT    . kidney removal Left   . KIDNEY SURGERY Left 1979   left kidney removed  . LEFT HEART CATH AND CORONARY ANGIOGRAPHY N/A 10/21/2017   Procedure: LEFT HEART CATH AND CORONARY ANGIOGRAPHY;  Surgeon: Dalia Heading, MD;  Location: [facility] INVASIVE CV LAB;  Service: Cardiovascular;  Laterality: N/A;  . POLYPECTOMY  10/31/2019   Procedure: POLYPECTOMY;  Surgeon: Rush Landmark Telford Nab., MD;  Location: [place];  Service: Gastroenterology;;  . TOTAL KNEE ARTHROPLASTY Left 07/06/2017   Procedure: TOTAL KNEE ARTHROPLASTY;  Surgeon: Lovell Sheehan, MD;  Location: [facility] ORS;  Service: Orthopedics;  Laterality: Left;  . UPPER ESOPHAGEAL ENDOSCOPIC ULTRASOUND (EUS) N/A 10/31/2019   Procedure: UPPER ESOPHAGEAL ENDOSCOPIC ULTRASOUND (EUS);  Surgeon: Irving Copas., MD;  Location: [place];   Service: Gastroenterology;  Laterality: N/A;  . WRIST ARTHROSCOPY Left 1970s  . XI ROBOTIC ASSISTED VENTRAL HERNIA N/A 12/01/2019   Procedure: XI ROBOTIC ASSISTED VENTRAL HERNIA;  Surgeon: Jules Husbands, MD;  Location: [facility] ORS;  Service: General;  Laterality: N/A;    There were no vitals filed for this visit.   Subjective Assessment - 08/09/20 1438    Subjective Patieint reports she is doing well during today's session. Reports she has been aware of her diet and limiting negative food choices. Patient also reports she has been taking her BP medication.    Pertinent History s/p Left TKA 07/06/2017; right knee arthritis with weakness as well with reports of right and left leg "giving way" intermittently; Going to have TKA 6/10    Limitations Walking;Standing;House hold activities;Other (comment)    How long can you stand comfortably? 2-3 hours    How long can you walk comfortably? 2-3 hours    Diagnostic tests imaging - see scanned docs    Patient Stated Goals Improve motion and strength of  hips and R knee and being able to walk with less/no pain.    Currently in Pain? No/denies    Pain Onset More than a month ago              TREATMENT Therapeutic Exercise Leg press in sitting with use of OMEGA -   x10 105#, 2 x 15 115# Nustep in sitting level 5 for 20min to address ABLA and improve LE strength and endurance  Deadlifts in standing - 2x10; 20# KB Lateral Lunges in standing with UE support -- 2 x 7      Performed exercises to improve LE strength     PT Education - 08/09/20 1442    Education provided Yes    Education Details form/technique with exercise    Person(s) Educated Patient    Methods Explanation;Demonstration    Comprehension Verbalized understanding;Returned demonstration            PT Short Term Goals - 07/10/20 1420      PT SHORT TERM GOAL #1   Title Patient will demonstrate independence with HEP to maximize rehab potential.    Time 3    Period Weeks     Status Achieved    Target Date 04/25/20             PT Long Term Goals - 07/10/20 1420      PT LONG TERM GOAL #1   Title Patient will demonstrate independence with progressive HEP to maintain progress made during therapy.    Baseline needs updates to inlcude knee ext ROM    Time 6    Period Weeks    Status On-going    Target Date 08/07/20      PT LONG TERM GOAL #2   Title Patient will improve FOTO score to 58 to show an improvement in patients ability to perform functional activities.    Baseline 50 at Eval; 05/23/20: 55; 53 on 12/7;    Time 8  Period Weeks    Status On-going    Target Date 09/04/20      PT LONG TERM GOAL #3   Title Patient will have a worst pain of 3/10 to indicate significant improvement with pain and ability to perform functional activities more functionally.     Baseline 10/10; 05/23/20: 5/10    Time 8    Period Weeks    Status On-going    Target Date 09/04/20      PT LONG TERM GOAL #4   Title After 8 weeks pt will demonstrate tolerance of 6MWT c distance >1020ft c LRAD.    Baseline using SPC, intermittent stumbles    Time 8    Period Weeks    Status New    Target Date 09/04/20                 Plan - 08/09/20 1443    Clinical Impression Statement Patient's BP is 132/58mmHg thus continued to perform therapeutic services during today's session. Continued to focus on improvement of LE stregntheing during today's session, she does well with this overall but continues to have increased difficulty with performing dynamic and ballistic movements. Patient will benefit from further skilled therapy to return to prior level of function.    Personal Factors and Comorbidities Age;Comorbidity 2    Comorbidities HBP, Obesity    Examination-Activity Limitations Stairs;Locomotion Level;Stand    Examination-Participation Restrictions Shop    Stability/Clinical Decision Making Evolving/Moderate complexity    Rehab Potential Good    Clinical Impairments  Affecting Rehab Potential (+) motivated, chronic condition (-) arthritis right knee with pain    PT Frequency 2x / week    PT Duration 6 weeks    PT Treatment/Interventions Electrical Stimulation;Cryotherapy;Moist Heat;Gait training;Stair training;Therapeutic activities;Therapeutic exercise;Balance training;Patient/family education;Neuromuscular re-education;Manual techniques;Passive range of motion;Dry needling;Spinal Manipulations;Joint Manipulations;Functional mobility training;Traction    PT Next Visit Plan Monitor BP throughout session and continue POC; obtain a scribpt for graded compression RLE thigh high    PT Home Exercise Plan Heel Raises, standing hip abduction    Consulted and Agree with Plan of Care Patient           Patient will benefit from skilled therapeutic intervention in order to improve the following deficits and impairments:  Pain,Decreased activity tolerance,Decreased endurance,Decreased range of motion,Decreased strength,Impaired perceived functional ability,Difficulty walking,Decreased mobility,Obesity  Visit Diagnosis: Stiffness of right knee, not elsewhere classified  Muscle weakness (generalized)  Chronic pain of right knee     Problem List Patient Active Problem List   Diagnosis Date Noted  . Mild intermittent asthma without complication 123456  . Shortness of breath 08/08/2020  . Vaginal candidiasis 08/08/2020  . Atherosclerosis of aorta ([place]) 07/09/2020  . Acute diverticulitis 05/13/2020  . Fever blister 10/15/2019  . Cerumen in auditory canal on examination 10/15/2019  . Bilateral hearing loss 10/15/2019  . Need for vaccination against Streptococcus pneumoniae using pneumococcal conjugate vaccine 7 10/15/2019  . Encounter for screening mammogram for malignant neoplasm of breast 10/15/2019  . Screening for osteoporosis 10/15/2019  . Elevated erythrocyte sedimentation rate 11/02/2018  . Lumbar spondylosis 11/02/2018  . PVC's (premature  ventricular contractions) 10/18/2018  . Primary osteoarthritis of right knee 09/15/2018  . Encounter for pre-operative examination 09/15/2018  . Obstructive sleep apnea 09/15/2018  . Calculus of gallbladder without cholecystitis without obstruction 09/15/2018  . Calculus of gallbladder with cholecystitis without biliary obstruction 01/27/2018  . Restless leg syndrome 01/27/2018  . Generalized abdominal pain 01/11/2018  . Cyst of pancreas 01/11/2018  .  Other specified disorders of kidney and ureter 01/11/2018  . Primary insomnia 01/11/2018  . Paroxysmal atrial fibrillation ([place]) 11/12/2017  . Essential hypertension 10/25/2017  . Mixed hyperlipidemia 10/25/2017  . Low back pain at multiple sites 10/25/2017  . Dysuria 10/25/2017  . Encounter for general adult medical examination with abnormal findings 10/25/2017  . Vitamin D deficiency 10/25/2017  . Near syncope   . Symptomatic bradycardia 10/19/2017  . Carpal tunnel syndrome 10/12/2017  . Primary osteoarthritis of left knee 07/09/2017  . History of total knee arthroplasty 07/06/2017  . Chest pain 06/29/2017  . Postop check 02/25/2017  . Impingement syndrome of shoulder region 01/20/2017  . Neck pain 01/20/2017  . Obesity (BMI 35.0-39.9 without comorbidity) 07/15/2016  . Endometrial polyp 07/15/2016  . Morbid obesity ([place]) 07/15/2016  . Family history of breast cancer in first degree relative 07/15/2016  . Family history of ovarian cancer 07/15/2016  . Knee pain 07/04/2016  . Bilateral leg pain 06/24/2016    Blythe Stanford, PT DPT 08/09/2020, 2:53 PM  [facility] [address] [place], [address] Phone: [phone]   Fax:  [phone]  Name: Whitney [name] MRN: [id number] Date of Birth: [birth year]

## 2020-08-14 ENCOUNTER — Other Ambulatory Visit: Payer: Self-pay

## 2020-08-14 ENCOUNTER — Ambulatory Visit (INDEPENDENT_AMBULATORY_CARE_PROVIDER_SITE_OTHER): Payer: Medicare Other | Admitting: Nurse Practitioner

## 2020-08-14 ENCOUNTER — Encounter: Payer: Self-pay | Admitting: Nurse Practitioner

## 2020-08-14 VITALS — BP 137/85 | HR 76 | Temp 97.0°F | Resp 16 | Ht 65.0 in | Wt 248.0 lb

## 2020-08-14 DIAGNOSIS — M1711 Unilateral primary osteoarthritis, right knee: Secondary | ICD-10-CM | POA: Diagnosis not present

## 2020-08-14 DIAGNOSIS — J4520 Mild intermittent asthma, uncomplicated: Secondary | ICD-10-CM | POA: Diagnosis not present

## 2020-08-14 DIAGNOSIS — I10 Essential (primary) hypertension: Secondary | ICD-10-CM | POA: Diagnosis not present

## 2020-08-14 NOTE — Progress Notes (Signed)
[facility] [place], [place] [address]  Internal MEDICINE  Office Visit Note  Patient Name: Whitney [name]  [id number]  [id number]  Date of Service: 08/14/2020  Chief Complaint  Patient presents with  . Follow-up    Pft results     The patient is here for follow up visit.  -had been having some shortness of breath and wheezing. Had been seen in ER due to the shortness of breath. Was prescribed rescue inhaler. States that she has only used the rescue inhaler a few times since her last visit -had PFT since she was last seen. The pulmonary function study is consistent with mild obstructive lung disease.   -sees her cardiologist tomorrow.  -continues to take physical therapy for her right knee.       Current Medication: Outpatient Encounter Medications as of 08/14/2020  Medication Sig  . albuterol (VENTOLIN HFA) 108 (90 Base) MCG/ACT inhaler Inhale 2 puffs into the lungs every 4 (four) hours as needed for wheezing or shortness of breath.  Marland Kitchen amLODipine (NORVASC) 5 MG tablet Take 1 tablet (5 mg total) by mouth 2 (two) times daily.  Marland Kitchen aspirin EC 81 MG tablet Take 81 mg by mouth daily.   . carvedilol (COREG) 3.125 MG tablet TAKE 2 TABLETS(6.25 MG) BY MOUTH TWICE DAILY WITH A MEAL (Patient taking differently: Take 6.25 mg by mouth in the morning and at bedtime.)  . Cholecalciferol (VITAMIN D) 50 MCG (2000 UT) tablet Take 2,000 Units by mouth in the morning and at bedtime.   . diclofenac Sodium (VOLTAREN) 1 % GEL Apply 1 application topically 4 (four) times daily as needed (pain).   Marland Kitchen ezetimibe (ZETIA) 10 MG tablet Take 1 tablet (10 mg total) by mouth daily.  . fluconazole (DIFLUCAN) 150 MG tablet Take 1 tab po every other day  . gabapentin (NEURONTIN) 100 MG capsule Take 100 mg by mouth 2 (two) times daily.  Marland Kitchen orlistat (ALLI) 60 MG capsule Take 60 mg by mouth 3 (three) times daily with meals.  Marland Kitchen OVER THE COUNTER MEDICATION Apply 1 application topically in the  morning and at bedtime. Tummy cream  . rOPINIRole (REQUIP) 0.5 MG tablet Take 0.5-1 mg by mouth 2 (two) times daily as needed (pain).  . traMADol (ULTRAM) 50 MG tablet Take one tab po bid for pain   No facility-administered encounter medications on file as of 08/14/2020.    Surgical History: Past Surgical History:  Procedure Laterality Date  . BIOPSY  10/31/2019   Procedure: BIOPSY;  Surgeon: Rush Landmark Telford Nab., MD;  Location: [place];  Service: Gastroenterology;;  . CARDIAC CATHETERIZATION    . CHOLECYSTECTOMY    . COLONOSCOPY WITH PROPOFOL N/A 06/13/2019   Procedure: COLONOSCOPY WITH PROPOFOL;  Surgeon: [name], MD;  Location: [facility] ENDOSCOPY;  Service: Gastroenterology;  Laterality: N/A;  . COLONOSCOPY WITH PROPOFOL N/A 08/12/2019   Procedure: COLONOSCOPY WITH PROPOFOL;  Surgeon: [name], MD;  Location: [facility] ENDOSCOPY;  Service: Gastroenterology;  Laterality: N/A;  . COLONOSCOPY WITH PROPOFOL N/A 09/19/2019   Procedure: COLONOSCOPY WITH PROPOFOL;  Surgeon: [name], MD;  Location: [facility] ENDOSCOPY;  Service: Gastroenterology;  Laterality: N/A;  . DILATION AND CURETTAGE OF UTERUS    . ESOPHAGOGASTRODUODENOSCOPY (EGD) WITH PROPOFOL N/A 06/13/2019   Procedure: ESOPHAGOGASTRODUODENOSCOPY (EGD) WITH PROPOFOL;  Surgeon: [name], MD;  Location: [facility] ENDOSCOPY;  Service: Gastroenterology;  Laterality: N/A;  Pt will go for COVID test on [id number] as she uses public transportation and will be in the  area on that day.   . ESOPHAGOGASTRODUODENOSCOPY (EGD) WITH PROPOFOL N/A 10/31/2019   Procedure: ESOPHAGOGASTRODUODENOSCOPY (EGD) WITH PROPOFOL;  Surgeon: Rush Landmark Telford Nab., MD;  Location: [place];  Service: Gastroenterology;  Laterality: N/A;  . HEMOSTASIS CLIP PLACEMENT  10/31/2019   Procedure: HEMOSTASIS CLIP PLACEMENT;  Surgeon: Irving Copas., MD;  Location: [place];  Service: Gastroenterology;;  . HYSTEROSCOPY WITH D & C N/A 02/16/2017   Procedure: DILATATION  AND CURETTAGE [place];  Surgeon: Brayton Mars, MD;  Location: [facility];  Service: Gynecology;  Laterality: N/A;  . JOINT REPLACEMENT    . kidney removal Left   . KIDNEY SURGERY Left 1979   left kidney removed  . LEFT HEART CATH AND CORONARY ANGIOGRAPHY N/A 10/21/2017   Procedure: LEFT HEART CATH AND CORONARY ANGIOGRAPHY;  Surgeon: Teodoro Spray, MD;  Location: [place] CV LAB;  Service: Cardiovascular;  Laterality: N/A;  . POLYPECTOMY  10/31/2019   Procedure: POLYPECTOMY;  Surgeon: Rush Landmark Telford Nab., MD;  Location: [place];  Service: Gastroenterology;;  . TOTAL KNEE ARTHROPLASTY Left 07/06/2017   Procedure: TOTAL KNEE ARTHROPLASTY;  Surgeon: Lovell Sheehan, MD;  Location: [facility];  Service: Orthopedics;  Laterality: Left;  . UPPER ESOPHAGEAL ENDOSCOPIC ULTRASOUND (EUS) N/A 10/31/2019   Procedure: UPPER ESOPHAGEAL ENDOSCOPIC ULTRASOUND (EUS);  Surgeon: Irving Copas., MD;  Location: [place];  Service: Gastroenterology;  Laterality: N/A;  . WRIST ARTHROSCOPY Left 1970s  . XI ROBOTIC ASSISTED VENTRAL HERNIA N/A 12/01/2019   Procedure: XI ROBOTIC ASSISTED VENTRAL HERNIA;  Surgeon: Jules Husbands, MD;  Location: [facility];  Service: General;  Laterality: N/A;    Medical History: Past Medical History:  Diagnosis Date  . Abnormal Pap smear of cervix    Pt states she had colposcopy   . Anxiety   . Arthritis   . Chronic kidney disease    removed left kidney  . Depression   . Diverticulitis   . Dyspnea   . Dysrhythmia   . Hx of unilateral nephrectomy 1979   Left Nephrectomy  . Hypertension   . Lower extremity edema   . Palpitations   . PMB (postmenopausal bleeding)   . Restless leg syndrome   . Sleep apnea    has no cpap  . Vitamin D deficiency     Family History: Family History  Problem Relation Age of Onset  . Ovarian cancer Mother   . Hypertension Father   . Diabetes Father   . Cancer Father   . Breast cancer Sister   . Diabetes  Sister     Social History   Socioeconomic History  . Marital status: Divorced    Spouse name: Not on file  . Number of children: Not on file  . Years of education: Not on file  . Highest education level: Not on file  Occupational History  . Not on file  Tobacco Use  . Smoking status: Never Smoker  . Smokeless tobacco: Never Used  Vaping Use  . Vaping Use: Never used  Substance and Sexual Activity  . Alcohol use: Yes    Alcohol/week: 2.0 standard drinks    Types: 2 Standard drinks or equivalent per week    Comment: occasionally  . Drug use: Yes    Frequency: 3.0 times per week    Types: Marijuana, Cocaine    Comment: 07/09/20 Pt states she uses for leg pain: marijuana; states no longer uses cocaine  . Sexual activity: Yes    Birth control/protection: None  Other  Topics Concern  . Not on file  Social History Narrative   Lives with cousin.   Social Determinants of Health   Financial Resource Strain: Not on file  Food Insecurity: Not on file  Transportation Needs: Not on file  Physical Activity: Not on file  Stress: Not on file  Social Connections: Not on file  Intimate Partner Violence: Not on file      Review of Systems  Constitutional: Negative for activity change, chills, fatigue and unexpected weight change.  HENT: Negative for congestion, postnasal drip, rhinorrhea, sneezing and sore throat.   Respiratory: Positive for shortness of breath and wheezing. Negative for cough and chest tightness.        Patient states that she is using her rescue inhaler only when needed. Has needed to use it three times total since she was last seen.   Cardiovascular: Positive for leg swelling. Negative for chest pain and palpitations.  Gastrointestinal: Negative for abdominal pain, constipation, diarrhea, nausea and vomiting.  Musculoskeletal: Positive for arthralgias and myalgias. Negative for back pain, joint swelling and neck pain.       Right knee pain. Routinely seeing  physical therapy and orthopedics for this.   Skin: Negative for rash.  Allergic/Immunologic: Negative for environmental allergies.  Neurological: Negative for dizziness, tremors, numbness and headaches.  Hematological: Negative for adenopathy. Does not bruise/bleed easily.  Psychiatric/Behavioral: Negative for behavioral problems (Depression), sleep disturbance and suicidal ideas. The patient is not nervous/anxious.     Today's Vitals   08/14/20 0929  BP: 137/85  Pulse: 76  Resp: 16  Temp: (!) 97 F (36.1 C)  SpO2: 98%  Weight: 248 lb (112.5 kg)  Height: 5\' 5"  (1.651 m)   Body mass index is 41.27 kg/m.    Physical Exam Vitals and nursing note reviewed.  Constitutional:      General: She is not in acute distress.    Appearance: Normal appearance. She is well-developed. She is obese. She is not diaphoretic.  HENT:     Head: Normocephalic and atraumatic.     Mouth/Throat:     Pharynx: No oropharyngeal exudate.  Eyes:     Pupils: Pupils are equal, round, and reactive to light.  Neck:     Thyroid: No thyromegaly.     Vascular: No carotid bruit or JVD.     Trachea: No tracheal deviation.  Cardiovascular:     Rate and Rhythm: Normal rate and regular rhythm.     Heart sounds: Normal heart sounds. No murmur heard. No friction rub. No gallop.   Pulmonary:     Effort: Pulmonary effort is normal. No respiratory distress.     Breath sounds: Normal breath sounds. No wheezing or rales.  Chest:     Chest wall: No tenderness.  Abdominal:     Palpations: Abdomen is soft.  Musculoskeletal:        General: Normal range of motion.     Cervical back: Normal range of motion and neck supple.     Comments: Moderate right knee pain and stiffness. Patient is currently walking with a cane. She has limp, favoring the right side.   Lymphadenopathy:     Cervical: No cervical adenopathy.  Skin:    General: Skin is warm and dry.  Neurological:     General: No focal deficit present.      Mental Status: She is alert and oriented to person, place, and time.     Cranial Nerves: No cranial nerve deficit.  Psychiatric:  Mood and Affect: Mood normal.        Behavior: Behavior normal.        Thought Content: Thought content normal.        Judgment: Judgment normal.    Assessment/Plan: 1. Mild intermittent asthma without complication Reviewed results of PFT showing mild obstructive disease. She should continue to use rescue inhaler as needed and as prescribed. Will monitor and repeat study PRN.   2. Essential hypertension Stable. Continue medication as prescribed. Patient to see cardiologist 08/15/2020  3. Primary osteoarthritis of right knee conitnue regular visits with physical therapy and orthopedics as scheduled.   General Counseling: tiffeny minchew understanding of the findings of todays visit and agrees with plan of treatment. I have discussed any further diagnostic evaluation that may be needed or ordered today. We also reviewed her medications today. she has been encouraged to call the office with any questions or concerns that should arise related to todays visit.   This patient was seen by Leretha Pol FNP Collaboration with Dr Lavera Guise as a part of collaborative care agreement   Total time spent: 25 Minutes    Time spent includes review of chart, medications, test results, and follow up plan with the patient.      Dr Lavera Guise Internal medicine

## 2020-08-15 DIAGNOSIS — I700 Atherosclerosis of aorta: Secondary | ICD-10-CM | POA: Diagnosis not present

## 2020-08-15 DIAGNOSIS — G4733 Obstructive sleep apnea (adult) (pediatric): Secondary | ICD-10-CM | POA: Diagnosis not present

## 2020-08-15 DIAGNOSIS — R0602 Shortness of breath: Secondary | ICD-10-CM | POA: Diagnosis not present

## 2020-08-15 DIAGNOSIS — I493 Ventricular premature depolarization: Secondary | ICD-10-CM | POA: Diagnosis not present

## 2020-08-15 DIAGNOSIS — I10 Essential (primary) hypertension: Secondary | ICD-10-CM | POA: Diagnosis not present

## 2020-08-15 DIAGNOSIS — I480 Paroxysmal atrial fibrillation: Secondary | ICD-10-CM | POA: Diagnosis not present

## 2020-08-16 ENCOUNTER — Ambulatory Visit: Payer: Medicare Other

## 2020-08-16 ENCOUNTER — Other Ambulatory Visit: Payer: Self-pay

## 2020-08-16 DIAGNOSIS — M6281 Muscle weakness (generalized): Secondary | ICD-10-CM | POA: Diagnosis not present

## 2020-08-16 DIAGNOSIS — M25661 Stiffness of right knee, not elsewhere classified: Secondary | ICD-10-CM | POA: Diagnosis not present

## 2020-08-16 DIAGNOSIS — G8929 Other chronic pain: Secondary | ICD-10-CM

## 2020-08-16 DIAGNOSIS — M25552 Pain in left hip: Secondary | ICD-10-CM | POA: Diagnosis not present

## 2020-08-16 DIAGNOSIS — M25561 Pain in right knee: Secondary | ICD-10-CM | POA: Diagnosis not present

## 2020-08-16 NOTE — Therapy (Signed)
[facility] [address], [place], [address] Phone: [phone]   Fax:  [phone]  Physical Therapy Treatment  Patient Details  Name: Whitney [name] MRN: [id number] Date of Birth: Oct 20, 1950 Referring Provider (PT): Kurtis Bushman MD   Encounter Date: 08/16/2020   PT End of Session - 08/16/20 1008    Visit Number 7    Number of Visits 16    Date for PT Re-Evaluation 09/04/20    Authorization Type [employer] MCR    Authorization Time Period 07/10/20-09/04/20    PT Start Time 0945    PT Stop Time 1030    PT Time Calculation (min) 45 min    Activity Tolerance Patient tolerated treatment well;Patient limited by pain    Behavior During Therapy [facility] for tasks assessed/performed           Past Medical History:  Diagnosis Date  . Abnormal Pap smear of cervix    Pt states she had colposcopy   . Anxiety   . Arthritis   . Chronic kidney disease    removed left kidney  . Depression   . Diverticulitis   . Dyspnea   . Dysrhythmia   . Hx of unilateral nephrectomy 1979   Left Nephrectomy  . Hypertension   . Lower extremity edema   . Palpitations   . PMB (postmenopausal bleeding)   . Restless leg syndrome   . Sleep apnea    has no cpap  . Vitamin D deficiency     Past Surgical History:  Procedure Laterality Date  . BIOPSY  10/31/2019   Procedure: BIOPSY;  Surgeon: Rush Landmark Telford Nab., MD;  Location: [place];  Service: Gastroenterology;;  . CARDIAC CATHETERIZATION    . CHOLECYSTECTOMY    . COLONOSCOPY WITH PROPOFOL N/A 06/13/2019   Procedure: COLONOSCOPY WITH PROPOFOL;  Surgeon: [name], MD;  Location: [facility] ENDOSCOPY;  Service: Gastroenterology;  Laterality: N/A;  . COLONOSCOPY WITH PROPOFOL N/A 08/12/2019   Procedure: COLONOSCOPY WITH PROPOFOL;  Surgeon: [name], MD;  Location: [facility] ENDOSCOPY;  Service: Gastroenterology;  Laterality: N/A;  . COLONOSCOPY WITH PROPOFOL N/A 09/19/2019   Procedure:  COLONOSCOPY WITH PROPOFOL;  Surgeon: [name], MD;  Location: [facility] ENDOSCOPY;  Service: Gastroenterology;  Laterality: N/A;  . DILATION AND CURETTAGE OF UTERUS    . ESOPHAGOGASTRODUODENOSCOPY (EGD) WITH PROPOFOL N/A 06/13/2019   Procedure: ESOPHAGOGASTRODUODENOSCOPY (EGD) WITH PROPOFOL;  Surgeon: [name], MD;  Location: [facility] ENDOSCOPY;  Service: Gastroenterology;  Laterality: N/A;  Pt will go for COVID test on 42-7-06 as she uses public transportation and will be in the area on that day.   . ESOPHAGOGASTRODUODENOSCOPY (EGD) WITH PROPOFOL N/A 10/31/2019   Procedure: ESOPHAGOGASTRODUODENOSCOPY (EGD) WITH PROPOFOL;  Surgeon: Rush Landmark Telford Nab., MD;  Location: [place];  Service: Gastroenterology;  Laterality: N/A;  . HEMOSTASIS CLIP PLACEMENT  10/31/2019   Procedure: HEMOSTASIS CLIP PLACEMENT;  Surgeon: Irving Copas., MD;  Location: [place];  Service: Gastroenterology;;  . HYSTEROSCOPY WITH D & C N/A 02/16/2017   Procedure: DILATATION AND CURETTAGE [place];  Surgeon: Brayton Mars, MD;  Location: [facility];  Service: Gynecology;  Laterality: N/A;  . JOINT REPLACEMENT    . kidney removal Left   . KIDNEY SURGERY Left 1979   left kidney removed  . LEFT HEART CATH AND CORONARY ANGIOGRAPHY N/A 10/21/2017   Procedure: LEFT HEART CATH AND CORONARY ANGIOGRAPHY;  Surgeon: Teodoro Spray, MD;  Location: [place] CV LAB;  Service: Cardiovascular;  Laterality: N/A;  . POLYPECTOMY  10/31/2019   Procedure: POLYPECTOMY;  Surgeon: Meridee ScoreMansouraty, Netty StarringGabriel Jr., MD;  [facility];  Service: Gastroenterology;;  . TOTAL KNEE ARTHROPLASTY Left 07/06/2017   Procedure: TOTAL KNEE ARTHROPLASTY;  Surgeon: Lyndle HerrlichBowers, James R, MD;  Location: [facility];  Service: Orthopedics;  Laterality: Left;  . UPPER ESOPHAGEAL ENDOSCOPIC ULTRASOUND (EUS) N/A 10/31/2019   Procedure: UPPER ESOPHAGEAL ENDOSCOPIC ULTRASOUND (EUS);  Surgeon: Lemar LoftyMansouraty, Gabriel Jr., MD;  [facility];   Service: Gastroenterology;  Laterality: N/A;  . WRIST ARTHROSCOPY Left 1970s  . XI ROBOTIC ASSISTED VENTRAL HERNIA N/A 12/01/2019   Procedure: XI ROBOTIC ASSISTED VENTRAL HERNIA;  Surgeon: Leafy RoPabon, Diego F, MD;  Location: [facility];  Service: General;  Laterality: N/A;    There were no vitals filed for this visit.   Subjective Assessment - 08/16/20 1007    Subjective Patient states she is feeling good today, no major changes since the previous session. Patient states she has been taking her BP which was 118/5972mmHg    Pertinent History s/p Left TKA 07/06/2017; right knee arthritis with weakness as well with reports of right and left leg "giving way" intermittently; Going to have TKA 6/10    Limitations Walking;Standing;House hold activities;Other (comment)    How long can you stand comfortably? 2-3 hours    How long can you walk comfortably? 2-3 hours    Diagnostic tests imaging - see scanned docs    Patient Stated Goals Improve motion and strength of  hips and R knee and being able to walk with less/no pain.    Currently in Pain? No/denies    Pain Onset More than a month ago               TREATMENT Therapeutic Exercise Nustep in sitting level 5 for 10min to address ABLA and improve LE strength and endurance  Deadlifts in standing - 2x10; 20# KB Total gym B LE squats - 2 x 20 level 26 Hip flexor stretch in standing - 3 x 10 with 3 sec holds Hip abduction at hip machine in standing - x15 40#, x 10 40# Lateral Lunges in standing with UE support -- x 7      Performed exercises to improve LE strength       PT Education - 08/16/20 1008    Education provided Yes    Education Details form/technique with exercise    Person(s) Educated Patient    Methods Explanation;Demonstration    Comprehension Verbalized understanding;Returned demonstration            PT Short Term Goals - 07/10/20 1420      PT SHORT TERM GOAL #1   Title Patient will demonstrate independence with HEP to  maximize rehab potential.    Time 3    Period Weeks    Status Achieved    Target Date 04/25/20             PT Long Term Goals - 07/10/20 1420      PT LONG TERM GOAL #1   Title Patient will demonstrate independence with progressive HEP to maintain progress made during therapy.    Baseline needs updates to inlcude knee ext ROM    Time 6    Period Weeks    Status On-going    Target Date 08/07/20      PT LONG TERM GOAL #2   Title Patient will improve FOTO score to 58 to show an improvement in patients ability to perform functional activities.  Baseline 50 at Eval; 05/23/20: 55; 53 on 12/7;    Time 8    Period Weeks    Status On-going    Target Date 09/04/20      PT LONG TERM GOAL #3   Title Patient will have a worst pain of 3/10 to indicate significant improvement with pain and ability to perform functional activities more functionally.     Baseline 10/10; 05/23/20: 5/10    Time 8    Period Weeks    Status On-going    Target Date 09/04/20      PT LONG TERM GOAL #4   Title After 8 weeks pt will demonstrate tolerance of c distance >1091ft c LRAD.    Baseline using SPC, intermittent stumbles    Time 8    Period Weeks    Status New    Target Date 09/04/20                 Plan - 08/16/20 1011    Clinical Impression Statement Patient's BP 151/48mmHg thus continued to perform therapeutic exercise. Patient demonstrates increased anterior hip pain after performing the Nustep, along the L LE. This is improved after performing hip flexor stretch indicating muscular spasms along the affected side. Will continue to focus on improving muscular strength and cardiovascular endurance to return to prior level of function. Patient is improving overall and patient will benefit from further skilled therapy to return to prior level of function.    Personal Factors and Comorbidities Age;Comorbidity 2    Comorbidities HBP, Obesity    Examination-Activity Limitations  Stairs;Locomotion Level;Stand    Examination-Participation Restrictions Shop    Stability/Clinical Decision Making Evolving/Moderate complexity    Rehab Potential Good    Clinical Impairments Affecting Rehab Potential (+) motivated, chronic condition (-) arthritis right knee with pain    PT Frequency 2x / week    PT Duration 6 weeks    PT Treatment/Interventions Electrical Stimulation;Cryotherapy;Moist Heat;Gait training;Stair training;Therapeutic activities;Therapeutic exercise;Balance training;Patient/family education;Neuromuscular re-education;Manual techniques;Passive range of motion;Dry needling;Spinal Manipulations;Joint Manipulations;Functional mobility training;Traction    PT Next Visit Plan Monitor BP throughout session and continue POC; obtain a scribpt for graded compression RLE thigh high    PT Home Exercise Plan Heel Raises, standing hip abduction    Consulted and Agree with Plan of Care Patient           Patient will benefit from skilled therapeutic intervention in order to improve the following deficits and impairments:  Pain,Decreased activity tolerance,Decreased endurance,Decreased range of motion,Decreased strength,Impaired perceived functional ability,Difficulty walking,Decreased mobility,Obesity  Visit Diagnosis: Stiffness of right knee, not elsewhere classified  Muscle weakness (generalized)  Chronic pain of right knee     Problem List Patient Active Problem List   Diagnosis Date Noted  . Mild intermittent asthma without complication 08/08/2020  . Shortness of breath 08/08/2020  . Vaginal candidiasis 08/08/2020  . Atherosclerosis of aorta (HCC) 07/09/2020  . Acute diverticulitis 05/13/2020  . Fever blister 10/15/2019  . Cerumen in auditory canal on examination 10/15/2019  . Bilateral hearing loss 10/15/2019  . Need for vaccination against Streptococcus pneumoniae using pneumococcal conjugate vaccine 7 10/15/2019  . Encounter for screening mammogram for  malignant neoplasm of breast 10/15/2019  . Screening for osteoporosis 10/15/2019  . Elevated erythrocyte sedimentation rate 11/02/2018  . Lumbar spondylosis 11/02/2018  . PVC's (premature ventricular contractions) 10/18/2018  . Primary osteoarthritis of right knee 09/15/2018  . Encounter for pre-operative examination 09/15/2018  . Obstructive sleep apnea 09/15/2018  . Calculus of  gallbladder without cholecystitis without obstruction 09/15/2018  . Calculus of gallbladder with cholecystitis without biliary obstruction 01/27/2018  . Restless leg syndrome 01/27/2018  . Generalized abdominal pain 01/11/2018  . Cyst of pancreas 01/11/2018  . Other specified disorders of kidney and ureter 01/11/2018  . Primary insomnia 01/11/2018  . Paroxysmal atrial fibrillation ([place]) 11/12/2017  . Essential hypertension 10/25/2017  . Mixed hyperlipidemia 10/25/2017  . Low back pain at multiple sites 10/25/2017  . Dysuria 10/25/2017  . Encounter for general adult medical examination with abnormal findings 10/25/2017  . Vitamin D deficiency 10/25/2017  . Near syncope   . Symptomatic bradycardia 10/19/2017  . Carpal tunnel syndrome 10/12/2017  . Primary osteoarthritis of left knee 07/09/2017  . History of total knee arthroplasty 07/06/2017  . Chest pain 06/29/2017  . Postop check 02/25/2017  . Impingement syndrome of shoulder region 01/20/2017  . Neck pain 01/20/2017  . Obesity (BMI 35.0-39.9 without comorbidity) 07/15/2016  . Endometrial polyp 07/15/2016  . Morbid obesity ([place]) 07/15/2016  . Family history of breast cancer in first degree relative 07/15/2016  . Family history of ovarian cancer 07/15/2016  . Knee pain 07/04/2016  . Bilateral leg pain 06/24/2016    Blythe Stanford, PT DPT 08/16/2020, 3:43 PM  [facility] [address], [place], [address] Phone: [phone]   Fax:  [phone]  Name: Whitney [name] MRN:  [id number] Date of Birth: Jun 01, 1951

## 2020-08-22 ENCOUNTER — Ambulatory Visit: Payer: Medicare Other

## 2020-08-27 ENCOUNTER — Ambulatory Visit: Payer: Medicare Other

## 2020-08-30 ENCOUNTER — Ambulatory Visit: Payer: Medicare Other

## 2020-08-30 ENCOUNTER — Other Ambulatory Visit: Payer: Self-pay

## 2020-08-30 DIAGNOSIS — M25552 Pain in left hip: Secondary | ICD-10-CM | POA: Diagnosis not present

## 2020-08-30 DIAGNOSIS — G8929 Other chronic pain: Secondary | ICD-10-CM | POA: Diagnosis not present

## 2020-08-30 DIAGNOSIS — M25661 Stiffness of right knee, not elsewhere classified: Secondary | ICD-10-CM

## 2020-08-30 DIAGNOSIS — M25561 Pain in right knee: Secondary | ICD-10-CM

## 2020-08-30 DIAGNOSIS — M6281 Muscle weakness (generalized): Secondary | ICD-10-CM | POA: Diagnosis not present

## 2020-08-30 NOTE — Therapy (Signed)
[facility] [address] [place], [place], [id number] Phone: [phone]   Fax:  [phone]  Physical Therapy Treatment  Patient Details  Name: Whitney [name] MRN: [id number] Date of Birth: 01/10/1951 Referring Provider (PT): Cassell SmilesBowers, James MD   Encounter Date: [name]   PT End of Session - 08/30/20 1159    Visit Number 8    Number of Visits 16    Date for PT Re-Evaluation 09/04/20    Authorization Type UHC MCR    Authorization Time Period 07/10/20-09/04/20    PT Start Time 1115    PT Stop Time 1200    PT Time Calculation (min) 45 min    Activity Tolerance Patient tolerated treatment well;Patient limited by pain    Behavior [facility] Therapy [facility] for tasks assessed/performed           Past Medical History:  Diagnosis Date  . Abnormal Pap smear of cervix    Pt states she had colposcopy   . Anxiety   . Arthritis   . Chronic kidney disease    removed left kidney  . Depression   . Diverticulitis   . Dyspnea   . Dysrhythmia   . Hx of unilateral nephrectomy 1979   Left Nephrectomy  . Hypertension   . Lower extremity edema   . Palpitations   . PMB (postmenopausal bleeding)   . Restless leg syndrome   . Sleep apnea    has no cpap  . Vitamin D deficiency     Past Surgical History:  Procedure Laterality Date  . BIOPSY  10/31/2019   Procedure: BIOPSY;  Surgeon: Meridee ScoreMansouraty, Netty StarringGabriel Jr., MD;  [facility];  Service: Gastroenterology;;  . CARDIAC CATHETERIZATION    . CHOLECYSTECTOMY    . COLONOSCOPY WITH PROPOFOL N/A 06/13/2019   Procedure: COLONOSCOPY WITH PROPOFOL;  Surgeon: Wyline MoodAnna, Kiran, MD;  [facility];  Service: Gastroenterology;  Laterality: N/A;  . COLONOSCOPY WITH PROPOFOL N/A 08/12/2019   Procedure: COLONOSCOPY WITH PROPOFOL;  Surgeon: Wyline MoodAnna, Kiran, MD;  [facility];  Service: Gastroenterology;  Laterality: N/A;  . COLONOSCOPY WITH PROPOFOL N/A 09/19/2019   Procedure:  COLONOSCOPY WITH PROPOFOL;  Surgeon: Wyline MoodAnna, Kiran, MD;  [facility];  Service: Gastroenterology;  Laterality: N/A;  . DILATION AND CURETTAGE OF UTERUS    . ESOPHAGOGASTRODUODENOSCOPY (EGD) WITH PROPOFOL N/A 06/13/2019   Procedure: ESOPHAGOGASTRODUODENOSCOPY (EGD) WITH PROPOFOL;  Surgeon: Wyline MoodAnna, Kiran, MD;  [facility];  Service: Gastroenterology;  Laterality: N/A;  Pt will go for COVID test on 06-08-19 as she uses public transportation and will be in the area on that day.   . ESOPHAGOGASTRODUODENOSCOPY (EGD) WITH PROPOFOL N/A 10/31/2019   Procedure: ESOPHAGOGASTRODUODENOSCOPY (EGD) WITH PROPOFOL;  Surgeon: Meridee ScoreMansouraty, Netty StarringGabriel Jr., MD;  [facility];  Service: Gastroenterology;  Laterality: N/A;  . HEMOSTASIS CLIP PLACEMENT  10/31/2019   Procedure: HEMOSTASIS CLIP PLACEMENT;  Surgeon: Lemar LoftyMansouraty, Gabriel Jr., MD;  [facility];  Service: Gastroenterology;;  . HYSTEROSCOPY WITH D & C N/A 02/16/2017   Procedure: DILATATION AND CURETTAGE Melton Krebs/HYSTEROSCOPY;  Surgeon: Herold Harmsefrancesco, Martin A, MD;  Location: [facility] ORS;  Service: Gynecology;  Laterality: N/A;  . JOINT REPLACEMENT    . kidney removal Left   . KIDNEY SURGERY Left 1979   left kidney removed  . LEFT HEART CATH AND CORONARY ANGIOGRAPHY N/A 10/21/2017   Procedure: LEFT HEART CATH AND CORONARY ANGIOGRAPHY;  Surgeon: Dalia HeadingFath, Kenneth A, MD;  Location: [facility] INVASIVE CV LAB;  Service: Cardiovascular;  Laterality: N/A;  . POLYPECTOMY  10/31/2019   Procedure: POLYPECTOMY;  Surgeon: Rush Landmark Telford Nab., MD;  Location: [place];  Service: Gastroenterology;;  . TOTAL KNEE ARTHROPLASTY Left 07/06/2017   Procedure: TOTAL KNEE ARTHROPLASTY;  Surgeon: Lovell Sheehan, MD;  Location: [facility] ORS;  Service: Orthopedics;  Laterality: Left;  . UPPER ESOPHAGEAL ENDOSCOPIC ULTRASOUND (EUS) N/A 10/31/2019   Procedure: UPPER ESOPHAGEAL ENDOSCOPIC ULTRASOUND (EUS);  Surgeon: Irving Copas., MD;  Location: [place];   Service: Gastroenterology;  Laterality: N/A;  . WRIST ARTHROSCOPY Left 1970s  . XI ROBOTIC ASSISTED VENTRAL HERNIA N/A 12/01/2019   Procedure: XI ROBOTIC ASSISTED VENTRAL HERNIA;  Surgeon: Jules Husbands, MD;  Location: [facility] ORS;  Service: General;  Laterality: N/A;    There were no vitals filed for this visit.   Subjective Assessment - 08/30/20 1125    Subjective Patient reports feeling like she has a lot of energy today, but has been dealing with some hip cramping which has limited activity.    Pertinent History s/p Left TKA 07/06/2017; right knee arthritis with weakness as well with reports of right and left leg "giving way" intermittently; Going to have TKA 6/10    Limitations Walking;Standing;House hold activities;Other (comment)    How long can you sit comfortably? No Limit    How long can you stand comfortably? 2-3 hours    How long can you walk comfortably? 2-3 hours    Diagnostic tests imaging - see scanned docs    Patient Stated Goals Improve motion and strength of  hips and R knee and being able to walk with less/no pain.    Currently in Pain? No/denies    Pain Onset More than a month ago           Nustep- Level 5 for 3 min, level 3 for 7 min  Hip abduction at machine: 40# 1x10  SLR nerve glides- 1x20 bilateral Hip flexor stretch- 1x10 bilateral (10 sec hold)  Manual Therapy STM to bilateral TFL and Gmed         PT Short Term Goals - 07/10/20 1420      PT SHORT TERM GOAL #1   Title Patient will demonstrate independence with HEP to maximize rehab potential.    Time 3    Period Weeks    Status Achieved    Target Date 04/25/20             PT Long Term Goals - 07/10/20 1420      PT LONG TERM GOAL #1   Title Patient will demonstrate independence with progressive HEP to maintain progress made during therapy.    Baseline needs updates to inlcude knee ext ROM    Time 6    Period Weeks    Status On-going    Target Date 08/07/20      PT LONG TERM GOAL #2    Title Patient will improve FOTO score to 58 to show an improvement in patients ability to perform functional activities.    Baseline 50 at Eval; 05/23/20: 55; 53 on 12/7;    Time 8    Period Weeks    Status On-going    Target Date 09/04/20      PT LONG TERM GOAL #3   Title Patient will have a worst pain of 3/10 to indicate significant improvement with pain and ability to perform functional activities more functionally.     Baseline 10/10; 05/23/20: 5/10    Time 8    Period Weeks  Status On-going    Target Date 09/04/20      PT LONG TERM GOAL #4   Title After 8 weeks pt will demonstrate tolerance of 6MWT c distance >1046ft c LRAD.    Baseline using SPC, intermittent stumbles    Time 8    Period Weeks    Status New    Target Date 09/04/20                 Plan - 08/30/20 1154    Clinical Impression Statement Patients BP was 155/79 thus continued to perform therapeutic exercise. Patient demonstrates anterior hip pain during Nustep along both LE. This improved after SLR nerve glides and Hip flexor stretch. Indicating muscle spasms along affected side and decreased neurodynamic ability. Will continue to focus on improving muscular strength and cardiobvacular enduracne to return to prior level of funciton.    Personal Factors and Comorbidities Age;Comorbidity 2    Comorbidities HBP, Obesity    Examination-Activity Limitations Stairs;Locomotion Level;Stand    Examination-Participation Restrictions Shop    Stability/Clinical Decision Making Evolving/Moderate complexity    Rehab Potential Good    Clinical Impairments Affecting Rehab Potential (+) motivated, chronic condition (-) arthritis right knee with pain    PT Frequency 2x / week    PT Duration 6 weeks    PT Treatment/Interventions Electrical Stimulation;Cryotherapy;Moist Heat;Gait training;Stair training;Therapeutic activities;Therapeutic exercise;Balance training;Patient/family education;Neuromuscular re-education;Manual  techniques;Passive range of motion;Dry needling;Spinal Manipulations;Joint Manipulations;Functional mobility training;Traction    PT Next Visit Plan Monitor BP throughout session and continue POC; obtain a scribpt for graded compression RLE thigh high    PT Home Exercise Plan Heel Raises, standing hip abduction    Consulted and Agree with Plan of Care Patient           Patient will benefit from skilled therapeutic intervention in order to improve the following deficits and impairments:  Pain,Decreased activity tolerance,Decreased endurance,Decreased range of motion,Decreased strength,Impaired perceived functional ability,Difficulty walking,Decreased mobility,Obesity  Visit Diagnosis: Stiffness of right knee, not elsewhere classified  Muscle weakness (generalized)  Chronic pain of right knee  Pain in left hip     Problem List Patient Active Problem List   Diagnosis Date Noted  . Mild intermittent asthma without complication 123456  . Shortness of breath 08/08/2020  . Vaginal candidiasis 08/08/2020  . Atherosclerosis of aorta ([place]) 07/09/2020  . Acute diverticulitis 05/13/2020  . Fever blister 10/15/2019  . Cerumen in auditory canal on examination 10/15/2019  . Bilateral hearing loss 10/15/2019  . Need for vaccination against Streptococcus pneumoniae using pneumococcal conjugate vaccine 7 10/15/2019  . Encounter for screening mammogram for malignant neoplasm of breast 10/15/2019  . Screening for osteoporosis 10/15/2019  . Elevated erythrocyte sedimentation rate 11/02/2018  . Lumbar spondylosis 11/02/2018  . PVC's (premature ventricular contractions) 10/18/2018  . Primary osteoarthritis of right knee 09/15/2018  . Encounter for pre-operative examination 09/15/2018  . Obstructive sleep apnea 09/15/2018  . Calculus of gallbladder without cholecystitis without obstruction 09/15/2018  . Calculus of gallbladder with cholecystitis without biliary obstruction 01/27/2018  .  Restless leg syndrome 01/27/2018  . Generalized abdominal pain 01/11/2018  . Cyst of pancreas 01/11/2018  . Other specified disorders of kidney and ureter 01/11/2018  . Primary insomnia 01/11/2018  . Paroxysmal atrial fibrillation ([place]) 11/12/2017  . Essential hypertension 10/25/2017  . Mixed hyperlipidemia 10/25/2017  . Low back pain at multiple sites 10/25/2017  . Dysuria 10/25/2017  . Encounter for general adult medical examination with abnormal findings 10/25/2017  . Vitamin D deficiency 10/25/2017  .  Near syncope   . Symptomatic bradycardia 10/19/2017  . Carpal tunnel syndrome 10/12/2017  . Primary osteoarthritis of left knee 07/09/2017  . History of total knee arthroplasty 07/06/2017  . Chest pain 06/29/2017  . Postop check 02/25/2017  . Impingement syndrome of shoulder region 01/20/2017  . Neck pain 01/20/2017  . Obesity (BMI 35.0-39.9 without comorbidity) 07/15/2016  . Endometrial polyp 07/15/2016  . Morbid obesity ([place]) 07/15/2016  . Family history of breast cancer in first degree relative 07/15/2016  . Family history of ovarian cancer 07/15/2016  . Knee pain 07/04/2016  . Bilateral leg pain 06/24/2016    Whitney [name], Whitney [name]  [place] PHYSICAL AND SPORTS MEDICINE [address] [place], [address] Phone: [phone]   Fax:  [phone]  Name: Whitney [name] MRN: [id number] Date of Birth: [birth year]

## 2020-09-04 ENCOUNTER — Ambulatory Visit: Payer: Medicare Other

## 2020-09-06 ENCOUNTER — Other Ambulatory Visit: Payer: Self-pay

## 2020-09-06 ENCOUNTER — Ambulatory Visit: Payer: Medicare Other | Attending: Orthopedic Surgery

## 2020-09-06 DIAGNOSIS — M6281 Muscle weakness (generalized): Secondary | ICD-10-CM | POA: Diagnosis not present

## 2020-09-06 DIAGNOSIS — M25552 Pain in left hip: Secondary | ICD-10-CM | POA: Diagnosis not present

## 2020-09-06 DIAGNOSIS — G8929 Other chronic pain: Secondary | ICD-10-CM | POA: Diagnosis not present

## 2020-09-06 DIAGNOSIS — M25661 Stiffness of right knee, not elsewhere classified: Secondary | ICD-10-CM | POA: Insufficient documentation

## 2020-09-06 DIAGNOSIS — M25561 Pain in right knee: Secondary | ICD-10-CM | POA: Insufficient documentation

## 2020-09-06 NOTE — Therapy (Signed)
[place] PH[facility]ICAL AND SPORTS MEDICINE [address] [place], [address] Phone: [phone]   Fax:  [phone]  Physical Therapy Treatment  Patient Details  Name: Whitney [name] MRN: [id number] Date of Birth: [birth year] Referring Provider (PT): Kurtis Bushman MD   Encounter Date: 09/06/2020   PT End of Session - 09/06/20 1202    Visit Number 9    Number of Visits 16    Date for PT Re-Evaluation 09/04/20    Authorization Type UHC MCR    Authorization Time Period 07/10/20-09/04/20    PT Start Time 1115    PT Stop Time 1200    PT Time Calculation (min) 45 min    Activity Tolerance Patient tolerated treatment well;Patient limited by pain    Behavior During Therapy [facility] for tasks assessed/performed           Past Medical History:  Diagnosis Date  . Abnormal Pap smear of cervix    Pt states she had colposcopy   . Anxiety   . Arthritis   . Chronic kidney disease    removed left kidney  . Depression   . Diverticulitis   . Dyspnea   . Dysrhythmia   . Hx of unilateral nephrectomy 1979   Left Nephrectomy  . Hypertension   . Lower extremity edema   . Palpitations   . PMB (postmenopausal bleeding)   . Restless leg syndrome   . Sleep apnea    has no cpap  . Vitamin D deficiency     Past Surgical History:  Procedure Laterality Date  . BIOPSY  10/31/2019   Procedure: BIOPSY;  Surgeon: Rush Landmark Telford Nab., MD;  Location: [place];  Service: Gastroenterology;;  . CARDIAC CATHETERIZATION    . CHOLECYSTECTOMY    . COLONOSCOPY WITH PROPOFOL N/A 06/13/2019   Procedure: COLONOSCOPY WITH PROPOFOL;  Surgeon: [name], MD;  Location: [facility] ENDOSCOPY;  Service: Gastroenterology;  Laterality: N/A;  . COLONOSCOPY WITH PROPOFOL N/A 08/12/2019   Procedure: COLONOSCOPY WITH PROPOFOL;  Surgeon: [name], MD;  Location: [facility] ENDOSCOPY;  Service: Gastroenterology;  Laterality: N/A;  . COLONOSCOPY WITH PROPOFOL N/A 09/19/2019   Procedure:  COLONOSCOPY WITH PROPOFOL;  Surgeon: [name], MD;  Location: [facility] ENDOSCOPY;  Service: Gastroenterology;  Laterality: N/A;  . DILATION AND CURETTAGE OF UTERUS    . ESOPHAGOGASTRODUODENOSCOPY (EGD) WITH PROPOFOL N/A 06/13/2019   Procedure: ESOPHAGOGASTRODUODENOSCOPY (EGD) WITH PROPOFOL;  Surgeon: [name], MD;  Location: [facility];  Service: Gastroenterology;  Laterality: N/A;  Pt will go for COVID test on 93-8-10 as she uses public transportation and will be in the area on that day.   . ESOPHAGOGASTRODUODENOSCOPY (EGD) WITH PROPOFOL N/A 10/31/2019   Procedure: ESOPHAGOGASTRODUODENOSCOPY (EGD) WITH PROPOFOL;  Surgeon: Rush Landmark Telford Nab., MD;  Location: [place];  Service: Gastroenterology;  Laterality: N/A;  . HEMOSTASIS CLIP PLACEMENT  10/31/2019   Procedure: HEMOSTASIS CLIP PLACEMENT;  Surgeon: Irving Copas., MD;  Location: [place];  Service: Gastroenterology;;  . HYSTEROSCOPY WITH D & C N/A 02/16/2017   Procedure: DILATATION AND CURETTAGE [place];  Surgeon: Brayton Mars, MD;  Location: [facility] ORS;  Service: Gynecology;  Laterality: N/A;  . JOINT REPLACEMENT    . kidney removal Left   . KIDNEY SURGERY Left 1979   left kidney removed  . LEFT HEART CATH AND CORONARY ANGIOGRAPHY N/A 10/21/2017   Procedure: LEFT HEART CATH AND CORONARY ANGIOGRAPHY;  Surgeon: Teodoro Spray, MD;  Location: [place] CV LAB;  Service: Cardiovascular;  Laterality: N/A;  . POLYPECTOMY  10/31/2019   Procedure: POLYPECTOMY;  Surgeon: Rush Landmark Telford Nab., MD;  Location: [place];  Service: Gastroenterology;;  . TOTAL KNEE ARTHROPLASTY Left 07/06/2017   Procedure: TOTAL KNEE ARTHROPLASTY;  Surgeon: Lovell Sheehan, MD;  Location: [facility] ORS;  Service: Orthopedics;  Laterality: Left;  . UPPER ESOPHAGEAL ENDOSCOPIC ULTRASOUND (EUS) N/A 10/31/2019   Procedure: UPPER ESOPHAGEAL ENDOSCOPIC ULTRASOUND (EUS);  Surgeon: Irving Copas., MD;  Location: [place];   Service: Gastroenterology;  Laterality: N/A;  . WRIST ARTHROSCOPY Left 1970s  . XI ROBOTIC ASSISTED VENTRAL HERNIA N/A 12/01/2019   Procedure: XI ROBOTIC ASSISTED VENTRAL HERNIA;  Surgeon: Jules Husbands, MD;  Location: [facility] ORS;  Service: General;  Laterality: N/A;    There were no vitals filed for this visit.   Subjective Assessment - 09/06/20 1123    Subjective Patient reports hips are feeling better with exercises and stretches. No pain in hips/back/knees today/    Pertinent History s/p Left TKA 07/06/2017; right knee arthritis with weakness as well with reports of right and left leg "giving way" intermittently; Going to have TKA 6/10    Limitations Walking;Standing;House hold activities;Other (comment)    How long can you sit comfortably? No Limit    How long can you stand comfortably? 2-3 hours    How long can you walk comfortably? 2-3 hours    Diagnostic tests imaging - see scanned docs    Patient Stated Goals Improve motion and strength of  hips and R knee and being able to walk with less/no pain.    Currently in Pain? No/denies    Pain Onset More than a month ago             Therapeutic Exercise Nustep in sitting level 5 for 45min to address ABLA and improve LE strength and endurance  RDL  in standing - 2x10; 20# KB  Hip flexor stretch in standing - 3 x 10 with 3 sec holds Hip abduction at hip machine in standing -2x 10 40# Leg press with use of omega- 3x10 115#   Therapeutic exercise to improve LE strength         PT Education - 09/06/20 1124    Education Details form/technique with exercise    Person(s) Educated Patient    Methods Explanation;Demonstration    Comprehension Verbalized understanding;Returned demonstration            PT Short Term Goals - 07/10/20 1420      PT SHORT TERM GOAL #1   Title Patient will demonstrate independence with HEP to maximize rehab potential.    Time 3    Period Weeks    Status Achieved    Target Date 04/25/20              PT Long Term Goals - 07/10/20 1420      PT LONG TERM GOAL #1   Title Patient will demonstrate independence with progressive HEP to maintain progress made during therapy.    Baseline needs updates to inlcude knee ext ROM    Time 6    Period Weeks    Status On-going    Target Date 08/07/20      PT LONG TERM GOAL #2   Title Patient will improve FOTO score to 58 to show an improvement in patients ability to perform functional activities.    Baseline 50 at Eval; 05/23/20: 55; 53 on 12/7;    Time 8    Period Weeks  Status On-going    Target Date 09/04/20      PT LONG TERM GOAL #3   Title Patient will have a worst pain of 3/10 to indicate significant improvement with pain and ability to perform functional activities more functionally.     Baseline 10/10; 05/23/20: 5/10    Time 8    Period Weeks    Status On-going    Target Date 09/04/20      PT LONG TERM GOAL #4   Title After 8 weeks pt will demonstrate tolerance of 6MWT c distance >1066ft c LRAD.    Baseline using SPC, intermittent stumbles    Time 8    Period Weeks    Status New    Target Date 09/04/20                 Plan - 09/06/20 1158    Clinical Impression Statement Patients BP was 160/86 upon arrival today. Patient stated feeling really good. BP checked after 10 min on nustep and was 136/79. Patient tolderated exercises today without an increase in pain. Patient fatigued quickly throughout the session. Patient will benefit from further skilled therapy to return to prior level of function.    Personal Factors and Comorbidities Age;Comorbidity 2    Comorbidities HBP, Obesity    Examination-Activity Limitations Stairs;Locomotion Level;Stand    Examination-Participation Restrictions Shop    Stability/Clinical Decision Making Evolving/Moderate complexity    Rehab Potential Good    Clinical Impairments Affecting Rehab Potential (+) motivated, chronic condition (-) arthritis right knee with pain    PT  Frequency 2x / week    PT Duration 6 weeks    PT Treatment/Interventions Electrical Stimulation;Cryotherapy;Moist Heat;Gait training;Stair training;Therapeutic activities;Therapeutic exercise;Balance training;Patient/family education;Neuromuscular re-education;Manual techniques;Passive range of motion;Dry needling;Spinal Manipulations;Joint Manipulations;Functional mobility training;Traction    PT Next Visit Plan Monitor BP throughout session and continue POC; obtain a scribpt for graded compression RLE thigh high    PT Home Exercise Plan Heel Raises, standing hip abduction    Consulted and Agree with Plan of Care Patient           Patient will benefit from skilled therapeutic intervention in order to improve the following deficits and impairments:  Pain,Decreased activity tolerance,Decreased endurance,Decreased range of motion,Decreased strength,Impaired perceived functional ability,Difficulty walking,Decreased mobility,Obesity  Visit Diagnosis: Muscle weakness (generalized)  Stiffness of right knee, not elsewhere classified  Chronic pain of right knee     Problem List Patient Active Problem List   Diagnosis Date Noted  . Mild intermittent asthma without complication 81/19/1478  . Shortness of breath 08/08/2020  . Vaginal candidiasis 08/08/2020  . Atherosclerosis of aorta ([place]) 07/09/2020  . Acute diverticulitis 05/13/2020  . Fever blister 10/15/2019  . Cerumen in auditory canal on examination 10/15/2019  . Bilateral hearing loss 10/15/2019  . Need for vaccination against Streptococcus pneumoniae using pneumococcal conjugate vaccine 7 10/15/2019  . Encounter for screening mammogram for malignant neoplasm of breast 10/15/2019  . Screening for osteoporosis 10/15/2019  . Elevated erythrocyte sedimentation rate 11/02/2018  . Lumbar spondylosis 11/02/2018  . PVC's (premature ventricular contractions) 10/18/2018  . Primary osteoarthritis of right knee 09/15/2018  . Encounter for  pre-operative examination 09/15/2018  . Obstructive sleep apnea 09/15/2018  . Calculus of gallbladder without cholecystitis without obstruction 09/15/2018  . Calculus of gallbladder with cholecystitis without biliary obstruction 01/27/2018  . Restless leg syndrome 01/27/2018  . Generalized abdominal pain 01/11/2018  . Cyst of pancreas 01/11/2018  . Other specified disorders of kidney and ureter 01/11/2018  .  Primary insomnia 01/11/2018  . Paroxysmal atrial fibrillation ([place]) 11/12/2017  . Essential hypertension 10/25/2017  . Mixed hyperlipidemia 10/25/2017  . Low back pain at multiple sites 10/25/2017  . Dysuria 10/25/2017  . Encounter for general adult medical examination with abnormal findings 10/25/2017  . Vitamin D deficiency 10/25/2017  . Near syncope   . Symptomatic bradycardia 10/19/2017  . Carpal tunnel syndrome 10/12/2017  . Primary osteoarthritis of left knee 07/09/2017  . History of total knee arthroplasty 07/06/2017  . Chest pain 06/29/2017  . Postop check 02/25/2017  . Impingement syndrome of shoulder region 01/20/2017  . Neck pain 01/20/2017  . Obesity (BMI 35.0-39.9 without comorbidity) 07/15/2016  . Endometrial polyp 07/15/2016  . Morbid obesity ([place]) 07/15/2016  . Family history of breast cancer in first degree relative 07/15/2016  . Family history of ovarian cancer 07/15/2016  . Knee pain 07/04/2016  . Bilateral leg pain 06/24/2016    Candi Leash SPT 09/06/2020, 12:03 PM  [place] [facility]TS MEDICINE [address] [place], [address] Phone: [phone]   Fax:  [phone]  Name: Whitney [name] MRN: [id number] Date of Birth: 1951-04-15

## 2020-09-10 ENCOUNTER — Ambulatory Visit: Payer: Medicare Other

## 2020-09-12 ENCOUNTER — Ambulatory Visit: Payer: Medicare Other

## 2020-09-24 ENCOUNTER — Ambulatory Visit: Payer: Medicare Other

## 2020-09-24 ENCOUNTER — Other Ambulatory Visit: Payer: Self-pay

## 2020-09-24 DIAGNOSIS — M6281 Muscle weakness (generalized): Secondary | ICD-10-CM

## 2020-09-24 DIAGNOSIS — G8929 Other chronic pain: Secondary | ICD-10-CM | POA: Diagnosis not present

## 2020-09-24 DIAGNOSIS — M25661 Stiffness of right knee, not elsewhere classified: Secondary | ICD-10-CM | POA: Diagnosis not present

## 2020-09-24 DIAGNOSIS — M25561 Pain in right knee: Secondary | ICD-10-CM

## 2020-09-24 DIAGNOSIS — M25552 Pain in left hip: Secondary | ICD-10-CM | POA: Diagnosis not present

## 2020-09-24 NOTE — Therapy (Signed)
[place] [facility] [address] [place], [address] Phone: [phone]   Fax:  [phone]  Physical Therapy Treatment/Reassessment  Reporting Period 07/10/2020 - 09/24/2020   Patient Details  Name: Whitney [name] MRN: [id number] Date of Birth: Oct 28, 1950 Referring Provider (PT): Kurtis Bushman MD   Encounter Date: 09/24/2020   PT End of Session - 09/24/20 1421    Visit Number 10    Number of Visits 16    Date for PT Re-Evaluation 09/04/20    Authorization Type UHC MCR    Authorization Time Period 07/10/20-09/04/20    PT Start Time 1300    PT Stop Time 1345    PT Time Calculation (min) 45 min    Activity Tolerance Patient tolerated treatment well;Patient limited by pain    Behavior During Therapy [facility] for tasks assessed/performed           Past Medical History:  Diagnosis Date  . Abnormal Pap smear of cervix    Pt states she had colposcopy   . Anxiety   . Arthritis   . Chronic kidney disease    removed left kidney  . Depression   . Diverticulitis   . Dyspnea   . Dysrhythmia   . Hx of unilateral nephrectomy 1979   Left Nephrectomy  . Hypertension   . Lower extremity edema   . Palpitations   . PMB (postmenopausal bleeding)   . Restless leg syndrome   . Sleep apnea    has no cpap  . Vitamin D deficiency     Past Surgical History:  Procedure Laterality Date  . BIOPSY  10/31/2019   Procedure: BIOPSY;  Surgeon: Rush Landmark Telford Nab., MD;  Location: [place];  Service: Gastroenterology;;  . CARDIAC CATHETERIZATION    . CHOLECYSTECTOMY    . COLONOSCOPY WITH PROPOFOL N/A 06/13/2019   Procedure: COLONOSCOPY WITH PROPOFOL;  Surgeon: [name], MD;  Location: [facility] ENDOSCOPY;  Service: Gastroenterology;  Laterality: N/A;  . COLONOSCOPY WITH PROPOFOL N/A 08/12/2019   Procedure: COLONOSCOPY WITH PROPOFOL;  Surgeon: [name], MD;  Location: [facility] ENDOSCOPY;  Service: Gastroenterology;  Laterality: N/A;   . COLONOSCOPY WITH PROPOFOL N/A 09/19/2019   Procedure: COLONOSCOPY WITH PROPOFOL;  Surgeon: [name], MD;  Location: [facility] ENDOSCOPY;  Service: Gastroenterology;  Laterality: N/A;  . DILATION AND CURETTAGE OF UTERUS    . ESOPHAGOGASTRODUODENOSCOPY (EGD) WITH PROPOFOL N/A 06/13/2019   Procedure: ESOPHAGOGASTRODUODENOSCOPY (EGD) WITH PROPOFOL;  Surgeon: [name], MD;  Location: [facility] ENDOSCOPY;  Service: Gastroenterology;  Laterality: N/A;  Pt will go for COVID test on 87-5-64 as she uses public transportation and will be in the area on that day.   . ESOPHAGOGASTRODUODENOSCOPY (EGD) WITH PROPOFOL N/A 10/31/2019   Procedure: ESOPHAGOGASTRODUODENOSCOPY (EGD) WITH PROPOFOL;  Surgeon: Rush Landmark Telford Nab., MD;  Location: [place];  Service: Gastroenterology;  Laterality: N/A;  . HEMOSTASIS CLIP PLACEMENT  10/31/2019   Procedure: HEMOSTASIS CLIP PLACEMENT;  Surgeon: Irving Copas., MD;  Location: [place];  Service: Gastroenterology;;  . HYSTEROSCOPY WITH D & C N/A 02/16/2017   Procedure: DILATATION AND CURETTAGE [place];  Surgeon: Brayton Mars, MD;  Location: [facility] ORS;  Service: Gynecology;  Laterality: N/A;  . JOINT REPLACEMENT    . kidney removal Left   . KIDNEY SURGERY Left 1979   left kidney removed  . LEFT HEART CATH AND CORONARY ANGIOGRAPHY N/A 10/21/2017   Procedure: LEFT HEART CATH AND CORONARY ANGIOGRAPHY;  Surgeon: Teodoro Spray, MD;  Location: [facility]  INVASIVE CV LAB;  Service: Cardiovascular;  Laterality: N/A;  . POLYPECTOMY  10/31/2019   Procedure: POLYPECTOMY;  Surgeon: Rush Landmark Telford Nab., MD;  Location: [place];  Service: Gastroenterology;;  . TOTAL KNEE ARTHROPLASTY Left 07/06/2017   Procedure: TOTAL KNEE ARTHROPLASTY;  Surgeon: Lovell Sheehan, MD;  Location: [facility] ORS;  Service: Orthopedics;  Laterality: Left;  . UPPER ESOPHAGEAL ENDOSCOPIC ULTRASOUND (EUS) N/A 10/31/2019   Procedure: UPPER ESOPHAGEAL ENDOSCOPIC ULTRASOUND (EUS);  Surgeon:  Irving Copas., MD;  Location: [place];  Service: Gastroenterology;  Laterality: N/A;  . WRIST ARTHROSCOPY Left 1970s  . XI ROBOTIC ASSISTED VENTRAL HERNIA N/A 12/01/2019   Procedure: XI ROBOTIC ASSISTED VENTRAL HERNIA;  Surgeon: Jules Husbands, MD;  Location: [facility] ORS;  Service: General;  Laterality: N/A;    There were no vitals filed for this visit.   Subjective Assessment - 09/24/20 1416    Subjective Patient states she is not confident with walking for long periods of time. Slight pain in LE B today.    Pertinent History s/p Left TKA 07/06/2017; right knee arthritis with weakness as well with reports of right and left leg "giving way" intermittently; Going to have TKA 6/10    Limitations Walking;Standing;House hold activities;Other (comment)    How long can you sit comfortably? No Limit    How long can you stand comfortably? 2-3 hours    How long can you walk comfortably? 2-3 hours    Diagnostic tests imaging - see scanned docs    Patient Stated Goals Improve motion and strength of  hips and R knee and being able to walk with less/no pain.    Currently in Pain? No/denies    Pain Score 3     Pain Location Leg    Pain Orientation Right    Pain Descriptors / Indicators Aching    Pain Type Chronic pain    Pain Onset More than a month ago    Pain Frequency Intermittent           TREATMENT Therapeutic Exercise Nustep in sitting level 3 -- x 10 min Hip abduction in standing -- 2 x 20 B Educated on stair performance at home performed exercises to address quad strength Ambulation with focus on performing with speed -- 6 min - 1180 ft   Performed exercises to pain and spasms  Home Management BP educated on performance at home, performed this x3 in the clinic and educated on ways to manage blood pressure measurements.      PT Education - 09/24/20 1421    Education provided Yes    Education Details form/technique with exercise    Person(s) Educated Patient     Methods Explanation;Demonstration    Comprehension Verbalized understanding;Returned demonstration            PT Short Term Goals - 07/10/20 1420      PT SHORT TERM GOAL #1   Title Patient will demonstrate independence with HEP to maximize rehab potential.    Time 3    Period Weeks    Status Achieved    Target Date 04/25/20             PT Long Term Goals - 09/24/20 1319      PT LONG TERM GOAL #1   Title Patient will demonstrate independence with progressive HEP to maintain progress made during therapy.    Baseline needs updates to inlcude knee ext ROM; 09/24/2020: dependent    Time 6    Period Weeks  Status On-going      PT LONG TERM GOAL #2   Title Patient will improve FOTO score to 58 to show an improvement in patients ability to perform functional activities.    Baseline 50 at Eval; 05/23/20: 55; 53 on 12/7; 09/24/2020: 49    Time 8    Period Weeks    Status On-going      PT LONG TERM GOAL #3   Title Patient will have a worst pain of 3/10 to indicate significant improvement with pain and ability to perform functional activities more functionally.     Baseline 10/10; 05/23/20: 5/10; 09/24/2020: 8/10    Time 8    Period Weeks    Status On-going      PT LONG TERM GOAL #4   Title After 8 weeks pt will demonstrate tolerance of 6MWT c distance >1036ft c LRAD.    Baseline using SPC, intermittent stumbles; 09/24/2020; 116ft    Time 8    Period Weeks    Status New                 Plan - 09/24/20 1423    Clinical Impression Statement Patient's BP is 139/66mmHg and continued to perform HEP Reassessment today. Patient improved 34minWT to 1156ft a signficant improvement compared to the previous reassessment. Although patient is improving, she continues to have limitations in pain and with FOTO measures. Is is most likley due to decreased confidence with movement, will continue to progress to previous sessions.    Personal Factors and Comorbidities Age;Comorbidity 2     Comorbidities HBP, Obesity    Examination-Activity Limitations Stairs;Locomotion Level;Stand    Examination-Participation Restrictions Shop    Stability/Clinical Decision Making Evolving/Moderate complexity    Rehab Potential Good    Clinical Impairments Affecting Rehab Potential (+) motivated, chronic condition (-) arthritis right knee with pain    PT Frequency 2x / week    PT Duration 6 weeks    PT Treatment/Interventions Electrical Stimulation;Cryotherapy;Moist Heat;Gait training;Stair training;Therapeutic activities;Therapeutic exercise;Balance training;Patient/family education;Neuromuscular re-education;Manual techniques;Passive range of motion;Dry needling;Spinal Manipulations;Joint Manipulations;Functional mobility training;Traction    PT Next Visit Plan Monitor BP throughout session and continue POC; obtain a scribpt for graded compression RLE thigh high    PT Home Exercise Plan Heel Raises, standing hip abduction    Consulted and Agree with Plan of Care Patient           Patient will benefit from skilled therapeutic intervention in order to improve the following deficits and impairments:  Pain,Decreased activity tolerance,Decreased endurance,Decreased range of motion,Decreased strength,Impaired perceived functional ability,Difficulty walking,Decreased mobility,Obesity  Visit Diagnosis: Muscle weakness (generalized)  Stiffness of right knee, not elsewhere classified  Chronic pain of right knee  Pain in left hip     Problem List Patient Active Problem List   Diagnosis Date Noted  . Mild intermittent asthma without complication 79/09/4095  . Shortness of breath 08/08/2020  . Vaginal candidiasis 08/08/2020  . Atherosclerosis of aorta ([place]) 07/09/2020  . Acute diverticulitis 05/13/2020  . Fever blister 10/15/2019  . Cerumen in auditory canal on examination 10/15/2019  . Bilateral hearing loss 10/15/2019  . Need for vaccination against Streptococcus pneumoniae using  pneumococcal conjugate vaccine 7 10/15/2019  . Encounter for screening mammogram for malignant neoplasm of breast 10/15/2019  . Screening for osteoporosis 10/15/2019  . Elevated erythrocyte sedimentation rate 11/02/2018  . Lumbar spondylosis 11/02/2018  . PVC's (premature ventricular contractions) 10/18/2018  . Primary osteoarthritis of right knee 09/15/2018  . Encounter for pre-operative examination 09/15/2018  .  Obstructive sleep apnea 09/15/2018  . Calculus of gallbladder without cholecystitis without obstruction 09/15/2018  . Calculus of gallbladder with cholecystitis without biliary obstruction 01/27/2018  . Restless leg syndrome 01/27/2018  . Generalized abdominal pain 01/11/2018  . Cyst of pancreas 01/11/2018  . Other specified disorders of kidney and ureter 01/11/2018  . Primary insomnia 01/11/2018  . Paroxysmal atrial fibrillation ([place]) 11/12/2017  . Essential hypertension 10/25/2017  . Mixed hyperlipidemia 10/25/2017  . Low back pain at multiple sites 10/25/2017  . Dysuria 10/25/2017  . Encounter for general adult medical examination with abnormal findings 10/25/2017  . Vitamin D deficiency 10/25/2017  . Near syncope   . Symptomatic bradycardia 10/19/2017  . Carpal tunnel syndrome 10/12/2017  . Primary osteoarthritis of left knee 07/09/2017  . History of total knee arthroplasty 07/06/2017  . Chest pain 06/29/2017  . Postop check 02/25/2017  . Impingement syndrome of shoulder region 01/20/2017  . Neck pain 01/20/2017  . Obesity (BMI 35.0-39.9 without comorbidity) 07/15/2016  . Endometrial polyp 07/15/2016  . Morbid obesity ([place]) 07/15/2016  . Family history of breast cancer in first degree relative 07/15/2016  . Family history of ovarian cancer 07/15/2016  . Knee pain 07/04/2016  . Bilateral leg pain 06/24/2016    Blythe Stanford, PT DPT 09/24/2020, 3:31 PM  [facility] [address],  [place], [address] Phone: [phone]   Fax:  [phone]  Name: Whitney [name] MRN: [id number] Date of Birth: 1950-12-24

## 2020-09-26 ENCOUNTER — Ambulatory Visit: Payer: Medicare Other

## 2020-09-26 ENCOUNTER — Other Ambulatory Visit: Payer: Self-pay

## 2020-09-26 DIAGNOSIS — M25561 Pain in right knee: Secondary | ICD-10-CM | POA: Diagnosis not present

## 2020-09-26 DIAGNOSIS — M25661 Stiffness of right knee, not elsewhere classified: Secondary | ICD-10-CM | POA: Diagnosis not present

## 2020-09-26 DIAGNOSIS — M6281 Muscle weakness (generalized): Secondary | ICD-10-CM | POA: Diagnosis not present

## 2020-09-26 DIAGNOSIS — M25552 Pain in left hip: Secondary | ICD-10-CM | POA: Diagnosis not present

## 2020-09-26 DIAGNOSIS — G8929 Other chronic pain: Secondary | ICD-10-CM | POA: Diagnosis not present

## 2020-09-26 NOTE — Therapy (Signed)
[facility] [place] [facility] [address] [place], [address] Phone: [phone]   Fax:  [phone]  Physical Therapy Treatment  Patient Details  Name: Whitney [name] MRN: [id number] Date of Birth: 1951-03-24 Referring Provider (PT): Kurtis Bushman MD   Encounter Date: 09/26/2020   PT End of Session - 09/26/20 1410    Visit Number 11    Number of Visits 16    Date for PT Re-Evaluation 09/04/20    Authorization Type UHC MCR    Authorization Time Period 07/10/20-09/04/20    PT Start Time 1300    PT Stop Time 1345    PT Time Calculation (min) 45 min    Activity Tolerance Patient tolerated treatment well;Patient limited by pain    Behavior During Therapy [facility] for tasks assessed/performed           Past Medical History:  Diagnosis Date  . Abnormal Pap smear of cervix    Pt states she had colposcopy   . Anxiety   . Arthritis   . Chronic kidney disease    removed left kidney  . Depression   . Diverticulitis   . Dyspnea   . Dysrhythmia   . Hx of unilateral nephrectomy 1979   Left Nephrectomy  . Hypertension   . Lower extremity edema   . Palpitations   . PMB (postmenopausal bleeding)   . Restless leg syndrome   . Sleep apnea    has no cpap  . Vitamin D deficiency     Past Surgical History:  Procedure Laterality Date  . BIOPSY  10/31/2019   Procedure: BIOPSY;  Surgeon: Rush Landmark Telford Nab., MD;  Location: [place];  Service: Gastroenterology;;  . CARDIAC CATHETERIZATION    . CHOLECYSTECTOMY    . COLONOSCOPY WITH PROPOFOL N/A 06/13/2019   Procedure: COLONOSCOPY WITH PROPOFOL;  Surgeon: [name], MD;  Location: [facility] ENDOSCOPY;  Service: Gastroenterology;  Laterality: N/A;  . COLONOSCOPY WITH PROPOFOL N/A 08/12/2019   Procedure: COLONOSCOPY WITH PROPOFOL;  Surgeon: [name], MD;  Location: [facility] ENDOSCOPY;  Service: Gastroenterology;  Laterality: N/A;  . COLONOSCOPY WITH PROPOFOL N/A 09/19/2019   Procedure:  COLONOSCOPY WITH PROPOFOL;  Surgeon: [name], MD;  Location: [facility] ENDOSCOPY;  Service: Gastroenterology;  Laterality: N/A;  . DILATION AND CURETTAGE OF UTERUS    . ESOPHAGOGASTRODUODENOSCOPY (EGD) WITH PROPOFOL N/A 06/13/2019   Procedure: ESOPHAGOGASTRODUODENOSCOPY (EGD) WITH PROPOFOL;  Surgeon: [name], MD;  Location: [facility] ENDOSCOPY;  Service: Gastroenterology;  Laterality: N/A;  Pt will go for COVID test on 41-9-62 as she uses public transportation and will be in the area on that day.   . ESOPHAGOGASTRODUODENOSCOPY (EGD) WITH PROPOFOL N/A 10/31/2019   Procedure: ESOPHAGOGASTRODUODENOSCOPY (EGD) WITH PROPOFOL;  Surgeon: Rush Landmark Telford Nab., MD;  Location: [place];  Service: Gastroenterology;  Laterality: N/A;  . HEMOSTASIS CLIP PLACEMENT  10/31/2019   Procedure: HEMOSTASIS CLIP PLACEMENT;  Surgeon: Irving Copas., MD;  Location: [place];  Service: Gastroenterology;;  . HYSTEROSCOPY WITH D & C N/A 02/16/2017   Procedure: DILATATION AND CURETTAGE [place];  Surgeon: Brayton Mars, MD;  Location: [facility] ORS;  Service: Gynecology;  Laterality: N/A;  . JOINT REPLACEMENT    . kidney removal Left   . KIDNEY SURGERY Left 1979   left kidney removed  . LEFT HEART CATH AND CORONARY ANGIOGRAPHY N/A 10/21/2017   Procedure: LEFT HEART CATH AND CORONARY ANGIOGRAPHY;  Surgeon: Teodoro Spray, MD;  Location: [place] CV LAB;  Service: Cardiovascular;  Laterality: N/A;  . POLYPECTOMY  10/31/2019   Procedure: POLYPECTOMY;  Surgeon: Rush Landmark Telford Nab., MD;  Location: [place];  Service: Gastroenterology;;  . TOTAL KNEE ARTHROPLASTY Left 07/06/2017   Procedure: TOTAL KNEE ARTHROPLASTY;  Surgeon: Lovell Sheehan, MD;  Location: [facility];  Service: Orthopedics;  Laterality: Left;  . UPPER ESOPHAGEAL ENDOSCOPIC ULTRASOUND (EUS) N/A 10/31/2019   Procedure: UPPER ESOPHAGEAL ENDOSCOPIC ULTRASOUND (EUS);  Surgeon: Irving Copas., MD;  Location: [place];   Service: Gastroenterology;  Laterality: N/A;  . WRIST ARTHROSCOPY Left 1970s  . XI ROBOTIC ASSISTED VENTRAL HERNIA N/A 12/01/2019   Procedure: XI ROBOTIC ASSISTED VENTRAL HERNIA;  Surgeon: Jules Husbands, MD;  Location: [facility]S;  Service: General;  Laterality: N/A;    There were no vitals filed for this visit.   Subjective Assessment - 09/26/20 1303    Subjective Paitnet roports feeling good today, no knee pain over the last week.    Pertinent History s/p Left TKA 07/06/2017; right knee arthritis with weakness as well with reports of right and left leg "giving way" intermittently; Going to have TKA 6/10    Limitations Walking;Standing;House hold activities;Other (comment)    How long can you sit comfortably? No Limit    How long can you stand comfortably? 2-3 hours    How long can you walk comfortably? 2-3 hours    Diagnostic tests imaging - see scanned docs    Patient Stated Goals Improve motion and strength of  hips and R knee and being able to walk with less/no pain.    Currently in Pain? No/denies    Pain Onset More than a month ago            Therapeutic Exercise Nustep in sitting level 5 for 57min to address ABLA and improve LE strength and endurance  Hip flexor stretch in standing - 3 x 10 with 3 sec holds Hip abduction at hip machine in standing -2x 10 40# Leg press with use of omega- 4x10 115#, 125, 125, 135  Step up 6in step with airex pad- 2x8 each side   Therapeutic exercise to improve LE strength         PT Education - 09/26/20 1346    Education Details Form/technique with exercise    Person(s) Educated Patient    Methods Explanation;Demonstration    Comprehension Verbalized understanding;Returned demonstration            PT Short Term Goals - 07/10/20 1420      PT SHORT TERM GOAL #1   Title Patient will demonstrate independence with HEP to maximize rehab potential.    Time 3    Period Weeks    Status Achieved    Target Date 04/25/20              PT Long Term Goals - 09/24/20 1319      PT LONG TERM GOAL #1   Title Patient will demonstrate independence with progressive HEP to maintain progress made during therapy.    Baseline needs updates to inlcude knee ext ROM; 09/24/2020: dependent    Time 6    Period Weeks    Status On-going      PT LONG TERM GOAL #2   Title Patient will improve FOTO score to 58 to show an improvement in patients ability to perform functional activities.    Baseline 50 at Eval; 05/23/20: 55; 53 on 12/7; 09/24/2020: 49    Time 8    Period Weeks    Status  On-going      PT LONG TERM GOAL #3   Title Patient will have a worst pain of 3/10 to indicate significant improvement with pain and ability to perform functional activities more functionally.     Baseline 10/10; 05/23/20: 5/10; 09/24/2020: 8/10    Time 8    Period Weeks    Status On-going      PT LONG TERM GOAL #4   Title After 8 weeks pt will demonstrate tolerance of 6MWT c distance >1093ft c LRAD.    Baseline using SPC, intermittent stumbles; 09/24/2020; 1196ft    Time 8    Period Weeks    Status New                 Plan - 09/26/20 1346    Clinical Impression Statement Patients BP was 137/90 today. Focused on LE strengthening today. Patient tolerated exericses without an increase in pain and increased wt for leg press showing improved LE strength. Patient fatigued quickly during the session showing decreased endurance. Patient will benefit from further skilled therapy to return to prior level of function.    Personal Factors and Comorbidities Age;Comorbidity 2    Comorbidities HBP, Obesity    Examination-Activity Limitations Stairs;Locomotion Level;Stand    Examination-Participation Restrictions Shop    Stability/Clinical Decision Making Evolving/Moderate complexity    Rehab Potential Good    Clinical Impairments Affecting Rehab Potential (+) motivated, chronic condition (-) arthritis right knee with pain    PT Frequency 2x / week     PT Duration 6 weeks    PT Treatment/Interventions Electrical Stimulation;Cryotherapy;Moist Heat;Gait training;Stair training;Therapeutic activities;Therapeutic exercise;Balance training;Patient/family education;Neuromuscular re-education;Manual techniques;Passive range of motion;Dry needling;Spinal Manipulations;Joint Manipulations;Functional mobility training;Traction    PT Next Visit Plan Monitor BP throughout session and continue POC; obtain a scribpt for graded compression RLE thigh high    PT Home Exercise Plan Heel Raises, standing hip abduction    Consulted and Agree with Plan of Care Patient           Patient will benefit from skilled therapeutic intervention in order to improve the following deficits and impairments:  Pain,Decreased activity tolerance,Decreased endurance,Decreased range of motion,Decreased strength,Impaired perceived functional ability,Difficulty walking,Decreased mobility,Obesity  Visit Diagnosis: Muscle weakness (generalized)  Stiffness of right knee, not elsewhere classified     Problem List Patient Active Problem List   Diagnosis Date Noted  . Mild intermittent asthma without complication 14/78/2956  . Shortness of breath 08/08/2020  . Vaginal candidiasis 08/08/2020  . Atherosclerosis of aorta ([place]) 07/09/2020  . Acute diverticulitis 05/13/2020  . Fever blister 10/15/2019  . Cerumen in auditory canal on examination 10/15/2019  . Bilateral hearing loss 10/15/2019  . Need for vaccination against Streptococcus pneumoniae using pneumococcal conjugate vaccine 7 10/15/2019  . Encounter for screening mammogram for malignant neoplasm of breast 10/15/2019  . Screening for osteoporosis 10/15/2019  . Elevated erythrocyte sedimentation rate 11/02/2018  . Lumbar spondylosis 11/02/2018  . PVC's (premature ventricular contractions) 10/18/2018  . Primary osteoarthritis of right knee 09/15/2018  . Encounter for pre-operative examination 09/15/2018  . Obstructive  sleep apnea 09/15/2018  . Calculus of gallbladder without cholecystitis without obstruction 09/15/2018  . Calculus of gallbladder with cholecystitis without biliary obstruction 01/27/2018  . Restless leg syndrome 01/27/2018  . Generalized abdominal pain 01/11/2018  . Cyst of pancreas 01/11/2018  . Other specified disorders of kidney and ureter 01/11/2018  . Primary insomnia 01/11/2018  . Paroxysmal atrial fibrillation ([place]) 11/12/2017  . Essential hypertension 10/25/2017  . Mixed hyperlipidemia 10/25/2017  .  Low back pain at multiple sites 10/25/2017  . Dysuria 10/25/2017  . Encounter for general adult medical examination with abnormal findings 10/25/2017  . Vitamin D deficiency 10/25/2017  . Near syncope   . Symptomatic bradycardia 10/19/2017  . Carpal tunnel syndrome 10/12/2017  . Primary osteoarthritis of left knee 07/09/2017  . History of total knee arthroplasty 07/06/2017  . Chest pain 06/29/2017  . Postop check 02/25/2017  . Impingement syndrome of shoulder region 01/20/2017  . Neck pain 01/20/2017  . Obesity (BMI 35.0-39.9 without comorbidity) 07/15/2016  . Endometrial polyp 07/15/2016  . Morbid obesity ([place]) 07/15/2016  . Family history of breast cancer in first degree relative 07/15/2016  . Family history of ovarian cancer 07/15/2016  . Knee pain 07/04/2016  . Bilateral leg pain 06/24/2016    Candi Leash 09/26/2020, 2:11 PM  Candi Leash SPT   [place] [facility] [address] [place], [address] Phone: [phone]   Fax:  [phone]  Name: Whitney [name] MRN: [id number] Date of Birth: [birth year]

## 2020-10-01 ENCOUNTER — Other Ambulatory Visit: Payer: Self-pay

## 2020-10-01 ENCOUNTER — Ambulatory Visit: Payer: Medicare Other

## 2020-10-01 DIAGNOSIS — G8929 Other chronic pain: Secondary | ICD-10-CM

## 2020-10-01 DIAGNOSIS — M25661 Stiffness of right knee, not elsewhere classified: Secondary | ICD-10-CM | POA: Diagnosis not present

## 2020-10-01 DIAGNOSIS — M25552 Pain in left hip: Secondary | ICD-10-CM | POA: Diagnosis not present

## 2020-10-01 DIAGNOSIS — M25561 Pain in right knee: Secondary | ICD-10-CM | POA: Diagnosis not present

## 2020-10-01 DIAGNOSIS — M6281 Muscle weakness (generalized): Secondary | ICD-10-CM | POA: Diagnosis not present

## 2020-10-01 NOTE — Therapy (Signed)
[place] [facility] [address] [place], [address] Phone: [phone]   Fax:  [phone]  Physical Therapy Treatment  Patient Details  Name: Whitney [name] MRN: [id number] Date of Birth: March 28, 1951 Referring Provider (PT): Kurtis Bushman MD   Encounter Date: 10/01/2020   PT End of Session - 10/01/20 1348    Visit Number 12    Number of Visits 16    Date for PT Re-Evaluation 09/04/20    Authorization Type UHC MCR    Authorization Time Period 07/10/20-09/04/20    PT Start Time 1300    PT Stop Time 1345    PT Time Calculation (min) 45 min    Activity Tolerance Patient tolerated treatment well;Patient limited by pain    Behavior During Therapy [facility] for tasks assessed/performed           Past Medical History:  Diagnosis Date  . Abnormal Pap smear of cervix    Pt states she had colposcopy   . Anxiety   . Arthritis   . Chronic kidney disease    removed left kidney  . Depression   . Diverticulitis   . Dyspnea   . Dysrhythmia   . Hx of unilateral nephrectomy 1979   Left Nephrectomy  . Hypertension   . Lower extremity edema   . Palpitations   . PMB (postmenopausal bleeding)   . Restless leg syndrome   . Sleep apnea    has no cpap  . Vitamin D deficiency     Past Surgical History:  Procedure Laterality Date  . BIOPSY  10/31/2019   Procedure: BIOPSY;  Surgeon: Rush Landmark Telford Nab., MD;  Location: [place];  Service: Gastroenterology;;  . CARDIAC CATHETERIZATION    . CHOLECYSTECTOMY    . COLONOSCOPY WITH PROPOFOL N/A 06/13/2019   Procedure: COLONOSCOPY WITH PROPOFOL;  Surgeon: [name], MD;  Location: [facility] ENDOSCOPY;  Service: Gastroenterology;  Laterality: N/A;  . COLONOSCOPY WITH PROPOFOL N/A 08/12/2019   Procedure: COLONOSCOPY WITH PROPOFOL;  Surgeon: [name], MD;  Location: [facility] ENDOSCOPY;  Service: Gastroenterology;  Laterality: N/A;  . COLONOSCOPY WITH PROPOFOL N/A 09/19/2019   Procedure:  COLONOSCOPY WITH PROPOFOL;  Surgeon: [name], MD;  Location: [facility] ENDOSCOPY;  Service: Gastroenterology;  Laterality: N/A;  . DILATION AND CURETTAGE OF UTERUS    . ESOPHAGOGASTRODUODENOSCOPY (EGD) WITH PROPOFOL N/A 06/13/2019   Procedure: ESOPHAGOGASTRODUODENOSCOPY (EGD) WITH PROPOFOL;  Surgeon: [name], MD;  Location: [facility] ENDOSCOPY;  Service: Gastroenterology;  Laterality: N/A;  Pt will go for COVID test on 69-6-29 as she uses public transportation and will be in the area on that day.   . ESOPHAGOGASTRODUODENOSCOPY (EGD) WITH PROPOFOL N/A 10/31/2019   Procedure: ESOPHAGOGASTRODUODENOSCOPY (EGD) WITH PROPOFOL;  Surgeon: Rush Landmark Telford Nab., MD;  Location: [place];  Service: Gastroenterology;  Laterality: N/A;  . HEMOSTASIS CLIP PLACEMENT  10/31/2019   Procedure: HEMOSTASIS CLIP PLACEMENT;  Surgeon: Irving Copas., MD;  Location: [place];  Service: Gastroenterology;;  . HYSTEROSCOPY WITH D & C N/A 02/16/2017   Procedure: DILATATION AND CURETTAGE [place];  Surgeon: Brayton Mars, MD;  Location: [facility];  Service: Gynecology;  Laterality: N/A;  . JOINT REPLACEMENT    . kidney removal Left   . KIDNEY SURGERY Left 1979   left kidney removed  . LEFT HEART CATH AND CORONARY ANGIOGRAPHY N/A 10/21/2017   Procedure: LEFT HEART CATH AND CORONARY ANGIOGRAPHY;  Surgeon: Teodoro Spray, MD;  Location: [place] CV LAB;  Service: Cardiovascular;  Laterality: N/A;  . POLYPECTOMY  10/31/2019   Procedure: POLYPECTOMY;  Surgeon: Rush Landmark Telford Nab., MD;  Location: [place];  Service: Gastroenterology;;  . TOTAL KNEE ARTHROPLASTY Left 07/06/2017   Procedure: TOTAL KNEE ARTHROPLASTY;  Surgeon: Lovell Sheehan, MD;  Location: [facility];  Service: Orthopedics;  Laterality: Left;  . UPPER ESOPHAGEAL ENDOSCOPIC ULTRASOUND (EUS) N/A 10/31/2019   Procedure: UPPER ESOPHAGEAL ENDOSCOPIC ULTRASOUND (EUS);  Surgeon: Irving Copas., MD;  Location: [place];   Service: Gastroenterology;  Laterality: N/A;  . WRIST ARTHROSCOPY Left 1970s  . XI ROBOTIC ASSISTED VENTRAL HERNIA N/A 12/01/2019   Procedure: XI ROBOTIC ASSISTED VENTRAL HERNIA;  Surgeon: Jules Husbands, MD;  Location: [facility];  Service: General;  Laterality: N/A;    There were no vitals filed for this visit.   Subjective Assessment - 10/01/20 1309    Subjective Patient reports feeling a little bit of R lower leg pain, but other than that she is feeling good.    Pertinent History s/p Left TKA 07/06/2017; right knee arthritis with weakness as well with reports of right and left leg "giving way" intermittently; Going to have TKA 6/10    Limitations Walking;Standing;House hold activities;Other (comment)    How long can you sit comfortably? No Limit    How long can you stand comfortably? 2-3 hours    How long can you walk comfortably? 2-3 hours    Diagnostic tests imaging - see scanned docs    Patient Stated Goals Improve motion and strength of  hips and R knee and being able to walk with less/no pain.    Currently in Pain? No/denies    Pain Onset More than a month ago           Therapeutic Exercise  Nustep in sitting (Seat/arms 9) level 5 for 25min to address ABLA and improve LE strength and endurance- Increase level next session Hip abduction at hip machine in standing -2x 10 40# Leg press with use of omega- 4x10 115#, 125, 125, 135  Knee extension- 2x12 (35#) Treadmill walking- 95min (1.71mph)   Therapeutic exercise to improve LE strength     PT Education - 10/01/20 1310    Education Details Form/technique with exercise    Person(s) Educated Patient    Methods Explanation;Demonstration    Comprehension Verbalized understanding;Returned demonstration            PT Short Term Goals - 07/10/20 1420      PT SHORT TERM GOAL #1   Title Patient will demonstrate independence with HEP to maximize rehab potential.    Time 3    Period Weeks    Status Achieved    Target Date  04/25/20             PT Long Term Goals - 09/24/20 1319      PT LONG TERM GOAL #1   Title Patient will demonstrate independence with progressive HEP to maintain progress made during therapy.    Baseline needs updates to inlcude knee ext ROM; 09/24/2020: dependent    Time 6    Period Weeks    Status On-going      PT LONG TERM GOAL #2   Title Patient will improve FOTO score to 58 to show an improvement in patients ability to perform functional activities.    Baseline 50 at Eval; 05/23/20: 55; 53 on 12/7; 09/24/2020: 49    Time 8    Period Weeks    Status On-going      PT  LONG TERM GOAL #3   Title Patient will have a worst pain of 3/10 to indicate significant improvement with pain and ability to perform functional activities more functionally.     Baseline 10/10; 05/23/20: 5/10; 09/24/2020: 8/10    Time 8    Period Weeks    Status On-going      PT LONG TERM GOAL #4   Title After 8 weeks pt will demonstrate tolerance of 6MWT c distance >1031ft c LRAD.    Baseline using SPC, intermittent stumbles; 09/24/2020; 1121ft    Time 8    Period Weeks    Status New                 Plan - 10/01/20 1345    Clinical Impression Statement Patients BP was 138/82 today. Continued focus on LE strengthing today. Patient tolerated exercises without an increase in pain and showed improved form on the Leg press machine. Patient showed difficulty with balance today as she was unable to perform normal walking while on the treadmill. Patient will benefit from further skilled therapy to return to PLOF.    Personal Factors and Comorbidities Age;Comorbidity 2    Comorbidities HBP, Obesity    Examination-Activity Limitations Stairs;Locomotion Level;Stand    Examination-Participation Restrictions Shop    Stability/Clinical Decision Making Evolving/Moderate complexity    Rehab Potential Good    Clinical Impairments Affecting Rehab Potential (+) motivated, chronic condition (-) arthritis right knee with  pain    PT Frequency 2x / week    PT Duration 6 weeks    PT Treatment/Interventions Electrical Stimulation;Cryotherapy;Moist Heat;Gait training;Stair training;Therapeutic activities;Therapeutic exercise;Balance training;Patient/family education;Neuromuscular re-education;Manual techniques;Passive range of motion;Dry needling;Spinal Manipulations;Joint Manipulations;Functional mobility training;Traction    PT Next Visit Plan Monitor BP throughout session and continue POC; obtain a scribpt for graded compression RLE thigh high    PT Home Exercise Plan Heel Raises, standing hip abduction    Consulted and Agree with Plan of Care Patient           Patient will benefit from skilled therapeutic intervention in order to improve the following deficits and impairments:  Pain,Decreased activity tolerance,Decreased endurance,Decreased range of motion,Decreased strength,Impaired perceived functional ability,Difficulty walking,Decreased mobility,Obesity  Visit Diagnosis: Muscle weakness (generalized)  Stiffness of right knee, not elsewhere classified  Chronic pain of right knee  Pain in left hip     Problem List Patient Active Problem List   Diagnosis Date Noted  . Mild intermittent asthma without complication 27/74/1287  . Shortness of breath 08/08/2020  . Vaginal candidiasis 08/08/2020  . Atherosclerosis of aorta ([place]) 07/09/2020  . Acute diverticulitis 05/13/2020  . Fever blister 10/15/2019  . Cerumen in auditory canal on examination 10/15/2019  . Bilateral hearing loss 10/15/2019  . Need for vaccination against Streptococcus pneumoniae using pneumococcal conjugate vaccine 7 10/15/2019  . Encounter for screening mammogram for malignant neoplasm of breast 10/15/2019  . Screening for osteoporosis 10/15/2019  . Elevated erythrocyte sedimentation rate 11/02/2018  . Lumbar spondylosis 11/02/2018  . PVC's (premature ventricular contractions) 10/18/2018  . Primary osteoarthritis of right  knee 09/15/2018  . Encounter for pre-operative examination 09/15/2018  . Obstructive sleep apnea 09/15/2018  . Calculus of gallbladder without cholecystitis without obstruction 09/15/2018  . Calculus of gallbladder with cholecystitis without biliary obstruction 01/27/2018  . Restless leg syndrome 01/27/2018  . Generalized abdominal pain 01/11/2018  . Cyst of pancreas 01/11/2018  . Other specified disorders of kidney and ureter 01/11/2018  . Primary insomnia 01/11/2018  . Paroxysmal atrial fibrillation ([place]) 11/12/2017  .  Essential hypertension 10/25/2017  . Mixed hyperlipidemia 10/25/2017  . Low back pain at multiple sites 10/25/2017  . Dysuria 10/25/2017  . Encounter for general adult medical examination with abnormal findings 10/25/2017  . Vitamin D deficiency 10/25/2017  . Near syncope   . Symptomatic bradycardia 10/19/2017  . Carpal tunnel syndrome 10/12/2017  . Primary osteoarthritis of left knee 07/09/2017  . History of total knee arthroplasty 07/06/2017  . Chest pain 06/29/2017  . Postop check 02/25/2017  . Impingement syndrome of shoulder region 01/20/2017  . Neck pain 01/20/2017  . Obesity (BMI 35.0-39.9 without comorbidity) 07/15/2016  . Endometrial polyp 07/15/2016  . Morbid obesity ([place]) 07/15/2016  . Family history of breast cancer in first degree relative 07/15/2016  . Family history of ovarian cancer 07/15/2016  . Knee pain 07/04/2016  . Bilateral leg pain 06/24/2016    Candi Leash 10/01/2020, 1:49 PM Candi Leash SPT  [place] [facility] [address] [place], [address] Phone: [phone]   Fax:  [phone]  Name: ZULA HOVSEPIAN MRN: [id number] Date of Birth: 1950-08-18

## 2020-10-02 ENCOUNTER — Other Ambulatory Visit: Payer: Self-pay | Admitting: Adult Health

## 2020-10-02 DIAGNOSIS — Z96652 Presence of left artificial knee joint: Secondary | ICD-10-CM | POA: Diagnosis not present

## 2020-10-02 DIAGNOSIS — E782 Mixed hyperlipidemia: Secondary | ICD-10-CM

## 2020-10-02 DIAGNOSIS — M1711 Unilateral primary osteoarthritis, right knee: Secondary | ICD-10-CM | POA: Diagnosis not present

## 2020-10-03 ENCOUNTER — Other Ambulatory Visit: Payer: Self-pay

## 2020-10-03 ENCOUNTER — Ambulatory Visit: Payer: Medicare Other | Attending: Orthopedic Surgery

## 2020-10-03 DIAGNOSIS — G8929 Other chronic pain: Secondary | ICD-10-CM | POA: Insufficient documentation

## 2020-10-03 DIAGNOSIS — M25661 Stiffness of right knee, not elsewhere classified: Secondary | ICD-10-CM | POA: Insufficient documentation

## 2020-10-03 DIAGNOSIS — M25561 Pain in right knee: Secondary | ICD-10-CM | POA: Diagnosis not present

## 2020-10-03 DIAGNOSIS — M25552 Pain in left hip: Secondary | ICD-10-CM | POA: Insufficient documentation

## 2020-10-03 DIAGNOSIS — M6281 Muscle weakness (generalized): Secondary | ICD-10-CM | POA: Insufficient documentation

## 2020-10-03 NOTE — Therapy (Addendum)
[place] [facility] [address] [place], [address] Phone: [phone]   Fax:  [phone]  Physical Therapy Treatment  Patient Details  Name: Whitney [name] MRN: [id number] Date of Birth: Dec 23, 1950 Referring Provider (PT): Kurtis Bushman MD   Encounter Date: 10/03/2020   PT End of Session - 10/03/20 1010    Visit Number 13    Number of Visits 16    Date for PT Re-Evaluation 11/29/20    Authorization Type UHC MCR    Authorization Time Period 07/10/20-09/04/20    PT Start Time 0945    PT Stop Time 1030    PT Time Calculation (min) 45 min    Activity Tolerance Patient tolerated treatment well;Patient limited by pain    Behavior During Therapy [facility] for tasks assessed/performed           Past Medical History:  Diagnosis Date  . Abnormal Pap smear of cervix    Pt states she had colposcopy   . Anxiety   . Arthritis   . Chronic kidney disease    removed left kidney  . Depression   . Diverticulitis   . Dyspnea   . Dysrhythmia   . Hx of unilateral nephrectomy 1979   Left Nephrectomy  . Hypertension   . Lower extremity edema   . Palpitations   . PMB (postmenopausal bleeding)   . Restless leg syndrome   . Sleep apnea    has no cpap  . Vitamin D deficiency     Past Surgical History:  Procedure Laterality Date  . BIOPSY  10/31/2019   Procedure: BIOPSY;  Surgeon: Rush Landmark Telford Nab., MD;  Location: [place];  Service: Gastroenterology;;  . CARDIAC CATHETERIZATION    . CHOLECYSTECTOMY    . COLONOSCOPY WITH PROPOFOL N/A 06/13/2019   Procedure: COLONOSCOPY WITH PROPOFOL;  Surgeon: [name], MD;  Location: [facility] ENDOSCOPY;  Service: Gastroenterology;  Laterality: N/A;  . COLONOSCOPY WITH PROPOFOL N/A 08/12/2019   Procedure: COLONOSCOPY WITH PROPOFOL;  Surgeon: [name], MD;  Location: [facility] ENDOSCOPY;  Service: Gastroenterology;  Laterality: N/A;  . COLONOSCOPY WITH PROPOFOL N/A 09/19/2019   Procedure:  COLONOSCOPY WITH PROPOFOL;  Surgeon: [name], MD;  Location: [facility] ENDOSCOPY;  Service: Gastroenterology;  Laterality: N/A;  . DILATION AND CURETTAGE OF UTERUS    . ESOPHAGOGASTRODUODENOSCOPY (EGD) WITH PROPOFOL N/A 06/13/2019   Procedure: ESOPHAGOGASTRODUODENOSCOPY (EGD) WITH PROPOFOL;  Surgeon: [name], MD;  Location: [facility] ENDOSCOPY;  Service: Gastroenterology;  Laterality: N/A;  Pt will go for COVID test on 72-0-94 as she uses public transportation and will be in the area on that day.   . ESOPHAGOGASTRODUODENOSCOPY (EGD) WITH PROPOFOL N/A 10/31/2019   Procedure: ESOPHAGOGASTRODUODENOSCOPY (EGD) WITH PROPOFOL;  Surgeon: Rush Landmark Telford Nab., MD;  Location: [place];  Service: Gastroenterology;  Laterality: N/A;  . HEMOSTASIS CLIP PLACEMENT  10/31/2019   Procedure: HEMOSTASIS CLIP PLACEMENT;  Surgeon: Irving Copas., MD;  Location: [place];  Service: Gastroenterology;;  . HYSTEROSCOPY WITH D & C N/A 02/16/2017   Procedure: DILATATION AND CURETTAGE Pollyann Glen;  Surgeon: Brayton Mars, MD;  Location: [facility];  Service: Gynecology;  Laterality: N/A;  . JOINT REPLACEMENT    . kidney removal Left   . KIDNEY SURGERY Left 1979   left kidney removed  . LEFT HEART CATH AND CORONARY ANGIOGRAPHY N/A 10/21/2017   Procedure: LEFT HEART CATH AND CORONARY ANGIOGRAPHY;  Surgeon: Teodoro Spray, MD;  Location: [place] CV LAB;  Service: Cardiovascular;  Laterality: N/A;  . POLYPECTOMY  10/31/2019   Procedure: POLYPECTOMY;  Surgeon: Rush Landmark Telford Nab., MD;  Location: [place];  Service: Gastroenterology;;  . TOTAL KNEE ARTHROPLASTY Left 07/06/2017   Procedure: TOTAL KNEE ARTHROPLASTY;  Surgeon: Lovell Sheehan, MD;  Location: [facility];  Service: Orthopedics;  Laterality: Left;  . UPPER ESOPHAGEAL ENDOSCOPIC ULTRASOUND (EUS) N/A 10/31/2019   Procedure: UPPER ESOPHAGEAL ENDOSCOPIC ULTRASOUND (EUS);  Surgeon: Irving Copas., MD;  Location: [place];   Service: Gastroenterology;  Laterality: N/A;  . WRIST ARTHROSCOPY Left 1970s  . XI ROBOTIC ASSISTED VENTRAL HERNIA N/A 12/01/2019   Procedure: XI ROBOTIC ASSISTED VENTRAL HERNIA;  Surgeon: Jules Husbands, MD;  Location: [facility];  Service: General;  Laterality: N/A;    There were no vitals filed for this visit.   Subjective Assessment - 10/03/20 1005    Subjective No pain currently, states she saw her physician yesterday who mentioned she is scheduled to get her TKA on June 1st this year.    Pertinent History s/p Left TKA 07/06/2017; right knee arthritis with weakness as well with reports of right and left leg "giving way" intermittently; Going to have TKA 6/10    Limitations Walking;Standing;House hold activities;Other (comment)    How long can you sit comfortably? No Limit    How long can you stand comfortably? 2-3 hours    How long can you walk comfortably? 2-3 hours    Diagnostic tests imaging - see scanned docs    Patient Stated Goals Improve motion and strength of  hips and R knee and being able to walk with less/no pain.    Currently in Pain? No/denies    Pain Onset More than a month ago              Therapeutic Exercise   Nustep in sitting (Seat/arms 9) level 5 for 47min to address ABLA and improve LE strength and endurance- Increase level next session Hip abduction at hip machine in standing -2x 10 55# Leg press with use of omega- 3x10 145# Lunges in standing - x 10 B  Treadmill walking- 50min (.7 mph) with focus on improving taking long strides    Therapeutic exercise to improve LE strength       PT Education - 10/03/20 1007    Education provided Yes    Education Details form/technique with exercise    Person(s) Educated Patient    Methods Explanation;Demonstration    Comprehension Verbalized understanding;Returned demonstration            PT Short Term Goals - 07/10/20 1420      PT SHORT TERM GOAL #1   Title Patient will demonstrate independence with HEP  to maximize rehab potential.    Time 3    Period Weeks    Status Achieved    Target Date 04/25/20             PT Long Term Goals - 09/24/20 1319      PT LONG TERM GOAL #1   Title Patient will demonstrate independence with progressive HEP to maintain progress made during therapy.    Baseline needs updates to inlcude knee ext ROM; 09/24/2020: dependent    Time 6    Period Weeks    Status On-going      PT LONG TERM GOAL #2   Title Patient will improve FOTO score to 58 to show an improvement in patients ability to perform functional activities.    Baseline 50 at Eval; 05/23/20: 55; 53  on 12/7; 09/24/2020: 49    Time 8    Period Weeks    Status On-going      PT LONG TERM GOAL #3   Title Patient will have a worst pain of 3/10 to indicate significant improvement with pain and ability to perform functional activities more functionally.     Baseline 10/10; 05/23/20: 5/10; 09/24/2020: 8/10    Time 8    Period Weeks    Status On-going      PT LONG TERM GOAL #4   Title After 8 weeks pt will demonstrate tolerance of 6MWT c distance >101ft c LRAD.    Baseline using SPC, intermittent stumbles; 09/24/2020; 1143ft    Time 8    Period Weeks    Status New                 Plan - 10/03/20 1011    Clinical Impression Statement Patient's BP today is 110/72 and continued to perform LE strengthening exercises today. Patient with improvement in quadricep strengthening as noted by improvement with leg press resistance. Patient demonstrates difficulty with muscular endurance however. This is noted by requiring 2 -56min breaks between exercises performed. Patient will benefit from further skilled therapy focused on improving strength to return to prior level of function.    Personal Factors and Comorbidities Age;Comorbidity 2    Comorbidities HBP, Obesity    Examination-Activity Limitations Stairs;Locomotion Level;Stand    Examination-Participation Restrictions Shop    Stability/Clinical  Decision Making Evolving/Moderate complexity    Rehab Potential Good    Clinical Impairments Affecting Rehab Potential (+) motivated, chronic condition (-) arthritis right knee with pain    PT Frequency 2x / week    PT Duration 6 weeks    PT Treatment/Interventions Electrical Stimulation;Cryotherapy;Moist Heat;Gait training;Stair training;Therapeutic activities;Therapeutic exercise;Balance training;Patient/family education;Neuromuscular re-education;Manual techniques;Passive range of motion;Dry needling;Spinal Manipulations;Joint Manipulations;Functional mobility training;Traction    PT Next Visit Plan Monitor BP throughout session and continue POC; obtain a scribpt for graded compression RLE thigh high    PT Home Exercise Plan Heel Raises, standing hip abduction    Consulted and Agree with Plan of Care Patient           Patient will benefit from skilled therapeutic intervention in order to improve the following deficits and impairments:  Pain,Decreased activity tolerance,Decreased endurance,Decreased range of motion,Decreased strength,Impaired perceived functional ability,Difficulty walking,Decreased mobility,Obesity  Visit Diagnosis: Muscle weakness (generalized)  Stiffness of right knee, not elsewhere classified  Chronic pain of right knee     Problem List Patient Active Problem List   Diagnosis Date Noted  . Mild intermittent asthma without complication 93/71/6967  . Shortness of breath 08/08/2020  . Vaginal candidiasis 08/08/2020  . Atherosclerosis of aorta ([place]) 07/09/2020  . Acute diverticulitis 05/13/2020  . Fever blister 10/15/2019  . Cerumen in auditory canal on examination 10/15/2019  . Bilateral hearing loss 10/15/2019  . Need for vaccination against Streptococcus pneumoniae using pneumococcal conjugate vaccine 7 10/15/2019  . Encounter for screening mammogram for malignant neoplasm of breast 10/15/2019  . Screening for osteoporosis 10/15/2019  . Elevated  erythrocyte sedimentation rate 11/02/2018  . Lumbar spondylosis 11/02/2018  . PVC's (premature ventricular contractions) 10/18/2018  . Primary osteoarthritis of right knee 09/15/2018  . Encounter for pre-operative examination 09/15/2018  . Obstructive sleep apnea 09/15/2018  . Calculus of gallbladder without cholecystitis without obstruction 09/15/2018  . Calculus of gallbladder with cholecystitis without biliary obstruction 01/27/2018  . Restless leg syndrome 01/27/2018  . Generalized abdominal pain 01/11/2018  .  Cyst of pancreas 01/11/2018  . Other specified disorders of kidney and ureter 01/11/2018  . Primary insomnia 01/11/2018  . Paroxysmal atrial fibrillation ([place]) 11/12/2017  . Essential hypertension 10/25/2017  . Mixed hyperlipidemia 10/25/2017  . Low back pain at multiple sites 10/25/2017  . Dysuria 10/25/2017  . Encounter for general adult medical examination with abnormal findings 10/25/2017  . Vitamin D deficiency 10/25/2017  . Near syncope   . Symptomatic bradycardia 10/19/2017  . Carpal tunnel syndrome 10/12/2017  . Primary osteoarthritis of left knee 07/09/2017  . History of total knee arthroplasty 07/06/2017  . Chest pain 06/29/2017  . Postop check 02/25/2017  . Impingement syndrome of shoulder region 01/20/2017  . Neck pain 01/20/2017  . Obesity (BMI 35.0-39.9 without comorbidity) 07/15/2016  . Endometrial polyp 07/15/2016  . Morbid obesity ([place]) 07/15/2016  . Family history of breast cancer in first degree relative 07/15/2016  . Family history of ovarian cancer 07/15/2016  . Knee pain 07/04/2016  . Bilateral leg pain 06/24/2016    Blythe Stanford, PT DPT 10/03/2020, 10:18 AM  [place] [facility] [address], [place], [address] Phone: [phone]   Fax:  [phone]  Name: Whitney [name] MRN: [id number] Date of Birth: March 29, 1951

## 2020-10-08 ENCOUNTER — Ambulatory Visit: Payer: Medicare Other

## 2020-10-09 DIAGNOSIS — I10 Essential (primary) hypertension: Secondary | ICD-10-CM | POA: Diagnosis not present

## 2020-10-09 DIAGNOSIS — R809 Proteinuria, unspecified: Secondary | ICD-10-CM | POA: Diagnosis not present

## 2020-10-09 DIAGNOSIS — N2581 Secondary hyperparathyroidism of renal origin: Secondary | ICD-10-CM | POA: Diagnosis not present

## 2020-10-09 DIAGNOSIS — N1832 Chronic kidney disease, stage 3b: Secondary | ICD-10-CM | POA: Diagnosis not present

## 2020-10-09 DIAGNOSIS — N1831 Chronic kidney disease, stage 3a: Secondary | ICD-10-CM | POA: Diagnosis not present

## 2020-10-10 ENCOUNTER — Ambulatory Visit: Payer: Medicare Other

## 2020-10-11 DIAGNOSIS — M5416 Radiculopathy, lumbar region: Secondary | ICD-10-CM | POA: Diagnosis not present

## 2020-10-11 DIAGNOSIS — M48062 Spinal stenosis, lumbar region with neurogenic claudication: Secondary | ICD-10-CM | POA: Diagnosis not present

## 2020-10-11 DIAGNOSIS — M5136 Other intervertebral disc degeneration, lumbar region: Secondary | ICD-10-CM | POA: Diagnosis not present

## 2020-10-15 ENCOUNTER — Ambulatory Visit: Payer: Medicare Other

## 2020-10-17 ENCOUNTER — Other Ambulatory Visit: Payer: Self-pay

## 2020-10-17 ENCOUNTER — Ambulatory Visit: Payer: Medicare Other

## 2020-10-22 ENCOUNTER — Ambulatory Visit: Payer: Medicare Other

## 2020-10-24 ENCOUNTER — Other Ambulatory Visit: Payer: Self-pay

## 2020-10-24 ENCOUNTER — Ambulatory Visit: Payer: Medicare Other

## 2020-10-24 DIAGNOSIS — M25661 Stiffness of right knee, not elsewhere classified: Secondary | ICD-10-CM | POA: Diagnosis not present

## 2020-10-24 DIAGNOSIS — G8929 Other chronic pain: Secondary | ICD-10-CM | POA: Diagnosis not present

## 2020-10-24 DIAGNOSIS — M6281 Muscle weakness (generalized): Secondary | ICD-10-CM

## 2020-10-24 DIAGNOSIS — M25561 Pain in right knee: Secondary | ICD-10-CM | POA: Diagnosis not present

## 2020-10-24 DIAGNOSIS — M25552 Pain in left hip: Secondary | ICD-10-CM | POA: Diagnosis not present

## 2020-10-24 NOTE — Therapy (Signed)
[place] [address], [place], [address] Phone: [phone]   Fax:  [phone]  Physical Therapy Treatment  Patient Details  Name: Whitney [name] MRN: [id number] Date of Birth: March 01, 1951 Referring Provider (PT): Kurtis Bushman MD   Encounter Date: 10/24/2020   PT End of Session - 10/24/20 1000    Visit Number 14    Number of Visits 16    Date for PT Re-Evaluation 11/29/20    Authorization Type [employer] MCR    Authorization Time Period 07/10/20-09/04/20    PT Start Time 0945    PT Stop Time 1030    PT Time Calculation (min) 45 min    Activity Tolerance Patient tolerated treatment well;Patient limited by pain    Behavior During Therapy [facility] for tasks assessed/performed           Past Medical History:  Diagnosis Date  . Abnormal Pap smear of cervix    Pt states she had colposcopy   . Anxiety   . Arthritis   . Chronic kidney disease    removed left kidney  . Depression   . Diverticulitis   . Dyspnea   . Dysrhythmia   . Hx of unilateral nephrectomy 1979   Left Nephrectomy  . Hypertension   . Lower extremity edema   . Palpitations   . PMB (postmenopausal bleeding)   . Restless leg syndrome   . Sleep apnea    has no cpap  . Vitamin D deficiency     Past Surgical History:  Procedure Laterality Date  . BIOPSY  10/31/2019   Procedure: BIOPSY;  Surgeon: Rush Landmark Telford Nab., MD;  Location: [place];  Service: Gastroenterology;;  . CARDIAC CATHETERIZATION    . CHOLECYSTECTOMY    . COLONOSCOPY WITH PROPOFOL N/A 06/13/2019   Procedure: COLONOSCOPY WITH PROPOFOL;  Surgeon: [name], MD;  Location: [facility] ENDOSCOPY;  Service: Gastroenterology;  Laterality: N/A;  . COLONOSCOPY WITH PROPOFOL N/A 08/12/2019   Procedure: COLONOSCOPY WITH PROPOFOL;  Surgeon: [name], MD;  Location: [facility] ENDOSCOPY;  Service: Gastroenterology;  Laterality: N/A;  . COLONOSCOPY WITH PROPOFOL N/A 09/19/2019   Procedure:  COLONOSCOPY WITH PROPOFOL;  Surgeon: [name], MD;  Location: [facility] ENDOSCOPY;  Service: Gastroenterology;  Laterality: N/A;  . DILATION AND CURETTAGE OF UTERUS    . ESOPHAGOGASTRODUODENOSCOPY (EGD) WITH PROPOFOL N/A 06/13/2019   Procedure: ESOPHAGOGASTRODUODENOSCOPY (EGD) WITH PROPOFOL;  Surgeon: [name], MD;  Location: [facility] ENDOSCOPY;  Service: Gastroenterology;  Laterality: N/A;  Pt will go for COVID test on 17-5-10 as she uses public transportation and will be in the area on that day.   . ESOPHAGOGASTRODUODENOSCOPY (EGD) WITH PROPOFOL N/A 10/31/2019   Procedure: ESOPHAGOGASTRODUODENOSCOPY (EGD) WITH PROPOFOL;  Surgeon: Rush Landmark Telford Nab., MD;  Location: [place];  Service: Gastroenterology;  Laterality: N/A;  . HEMOSTASIS CLIP PLACEMENT  10/31/2019   Procedure: HEMOSTASIS CLIP PLACEMENT;  Surgeon: Irving Copas., MD;  Location: [place];  Service: Gastroenterology;;  . HYSTEROSCOPY WITH D & C N/A 02/16/2017   Procedure: DILATATION AND CURETTAGE [place];  Surgeon: Brayton Mars, MD;  Location: [facility];  Service: Gynecology;  Laterality: N/A;  . JOINT REPLACEMENT    . kidney removal Left   . KIDNEY SURGERY Left 1979   left kidney removed  . LEFT HEART CATH AND CORONARY ANGIOGRAPHY N/A 10/21/2017   Procedure: LEFT HEART CATH AND CORONARY ANGIOGRAPHY;  Surgeon: Teodoro Spray, MD;  Location: [place] CV [facility];  Service: Cardiovascular;  Laterality: N/A;  . POLYPECTOMY  10/31/2019   Procedure: POLYPECTOMY;  Surgeon: Rush Landmark Telford Nab., MD;  Location: [place];  Service: Gastroenterology;;  . TOTAL KNEE ARTHROPLASTY Left 07/06/2017   Procedure: TOTAL KNEE ARTHROPLASTY;  Surgeon: Lovell Sheehan, MD;  Location: [facility];  Service: Orthopedics;  Laterality: Left;  . UPPER ESOPHAGEAL ENDOSCOPIC ULTRASOUND (EUS) N/A 10/31/2019   Procedure: UPPER ESOPHAGEAL ENDOSCOPIC ULTRASOUND (EUS);  Surgeon: Irving Copas., MD;  Location: [place];   Service: Gastroenterology;  Laterality: N/A;  . WRIST ARTHROSCOPY Left 1970s  . XI ROBOTIC ASSISTED VENTRAL HERNIA N/A 12/01/2019   Procedure: XI ROBOTIC ASSISTED VENTRAL HERNIA;  Surgeon: Jules Husbands, MD;  Location: [facility];  Service: General;  Laterality: N/A;    There were no vitals filed for this visit.   Subjective Assessment - 10/24/20 0958    Subjective Patient reports she had an injection in her low back pain which helped her low back pain considerably. Patient States she is feeling better since the previous session.    Pertinent History s/p Left TKA 07/06/2017; right knee arthritis with weakness as well with reports of right and left leg "giving way" intermittently; Going to have TKA 6/10    Limitations Walking;Standing;House hold activities;Other (comment)    How long can you sit comfortably? No Limit    How long can you stand comfortably? 2-3 hours    How long can you walk comfortably? 2-3 hours    Diagnostic tests imaging - see scanned docs    Patient Stated Goals Improve motion and strength of  hips and R knee and being able to walk with less/no pain.    Currently in Pain? No/denies    Pain Onset More than a month ago              Therapeutic Exercise   Nustep in sitting (Seat/arms 9) level 6 for 88min to address ABLA and improve LE strength and endurance- Increase level next session Leg press with use of omega- x10 125#; x 10 145# Side stepping with GTB around ankles - 70ft x 2 B LE Seated LAQ with GTB around ankles - 2 x 10    Therapeutic exercise to improve LE strength     PT Education - 10/24/20 0959    Education provided Yes    Education Details form/technique with exercise    Person(s) Educated Patient    Methods Explanation;Demonstration    Comprehension Verbalized understanding;Returned demonstration            PT Short Term Goals - 07/10/20 1420      PT SHORT TERM GOAL #1   Title Patient will demonstrate independence with HEP to maximize rehab  potential.    Time 3    Period Weeks    Status Achieved    Target Date 04/25/20             PT Long Term Goals - 09/24/20 1319      PT LONG TERM GOAL #1   Title Patient will demonstrate independence with progressive HEP to maintain progress made during therapy.    Baseline needs updates to inlcude knee ext ROM; 09/24/2020: dependent    Time 6    Period Weeks    Status On-going      PT LONG TERM GOAL #2   Title Patient will improve FOTO score to 58 to show an improvement in patients ability to perform functional activities.    Baseline 50 at Eval; 05/23/20: 55; 53 on 12/7;  09/24/2020: 49    Time 8    Period Weeks    Status On-going      PT LONG TERM GOAL #3   Title Patient will have a worst pain of 3/10 to indicate significant improvement with pain and ability to perform functional activities more functionally.     Baseline 10/10; 05/23/20: 5/10; 09/24/2020: 8/10    Time 8    Period Weeks    Status On-going      PT LONG TERM GOAL #4   Title After 8 weeks pt will demonstrate tolerance of 6MWT c distance >1035ft c LRAD.    Baseline using SPC, intermittent stumbles; 09/24/2020; 1169ft    Time 8    Period Weeks    Status New                 Plan - 10/24/20 1006    Clinical Impression Statement Patient's BP today is157/91 and progress with therapy. Continued to focus on performing LE and hip strengthening exercises throughout today's sesison to allow for improvement in ambulation and to best prepare for TKA surgery in June of this year. Patient continues to fatigue quickly however continues to be able to perform exercises with greater resistance levels. Patient is improving overall and willl benefit from further skilled therapy to return to prior level of function.    Personal Factors and Comorbidities Age;Comorbidity 2    Comorbidities HBP, Obesity    Examination-Activity Limitations Stairs;Locomotion Level;Stand    Examination-Participation Restrictions Shop     Stability/Clinical Decision Making Evolving/Moderate complexity    Rehab Potential Good    Clinical Impairments Affecting Rehab Potential (+) motivated, chronic condition (-) arthritis right knee with pain    PT Frequency 2x / week    PT Duration 6 weeks    PT Treatment/Interventions Electrical Stimulation;Cryotherapy;Moist Heat;Gait training;Stair training;Therapeutic activities;Therapeutic exercise;Balance training;Patient/family education;Neuromuscular re-education;Manual techniques;Passive range of motion;Dry needling;Spinal Manipulations;Joint Manipulations;Functional mobility training;Traction    PT Next Visit Plan Monitor BP throughout session and continue POC; obtain a scribpt for graded compression RLE thigh high    PT Home Exercise Plan Heel Raises, standing hip abduction    Consulted and Agree with Plan of Care Patient           Patient will benefit from skilled therapeutic intervention in order to improve the following deficits and impairments:  Pain,Decreased activity tolerance,Decreased endurance,Decreased range of motion,Decreased strength,Impaired perceived functional ability,Difficulty walking,Decreased mobility,Obesity  Visit Diagnosis: Muscle weakness (generalized)  Stiffness of right knee, not elsewhere classified  Chronic pain of right knee     Problem List Patient Active Problem List   Diagnosis Date Noted  . Mild intermittent asthma without complication 50/53/9767  . Shortness of breath 08/08/2020  . Vaginal candidiasis 08/08/2020  . Atherosclerosis of aorta ([place]) 07/09/2020  . Acute diverticulitis 05/13/2020  . Fever blister 10/15/2019  . Cerumen in auditory canal on examination 10/15/2019  . Bilateral hearing loss 10/15/2019  . Need for vaccination against Streptococcus pneumoniae using pneumococcal conjugate vaccine 7 10/15/2019  . Encounter for screening mammogram for malignant neoplasm of breast 10/15/2019  . Screening for osteoporosis 10/15/2019  .  Elevated erythrocyte sedimentation rate 11/02/2018  . Lumbar spondylosis 11/02/2018  . PVC's (premature ventricular contractions) 10/18/2018  . Primary osteoarthritis of right knee 09/15/2018  . Encounter for pre-operative examination 09/15/2018  . Obstructive sleep apnea 09/15/2018  . Calculus of gallbladder without cholecystitis without obstruction 09/15/2018  . Calculus of gallbladder with cholecystitis without biliary obstruction 01/27/2018  . Restless leg syndrome  01/27/2018  . Generalized abdominal pain 01/11/2018  . Cyst of pancreas 01/11/2018  . Other specified disorders of kidney and ureter 01/11/2018  . Primary insomnia 01/11/2018  . Paroxysmal atrial fibrillation ([place]) 11/12/2017  . Essential hypertension 10/25/2017  . Mixed hyperlipidemia 10/25/2017  . Low back pain at multiple sites 10/25/2017  . Dysuria 10/25/2017  . Encounter for general adult medical examination with abnormal findings 10/25/2017  . Vitamin D deficiency 10/25/2017  . Near syncope   . Symptomatic bradycardia 10/19/2017  . Carpal tunnel syndrome 10/12/2017  . Primary osteoarthritis of left knee 07/09/2017  . History of total knee arthroplasty 07/06/2017  . Chest pain 06/29/2017  . Postop check 02/25/2017  . Impingement syndrome of shoulder region 01/20/2017  . Neck pain 01/20/2017  . Obesity (BMI 35.0-39.9 without comorbidity) 07/15/2016  . Endometrial polyp 07/15/2016  . Morbid obesity ([place]) 07/15/2016  . Family history of breast cancer in first degree relative 07/15/2016  . Family history of ovarian cancer 07/15/2016  . Knee pain 07/04/2016  . Bilateral leg pain 06/24/2016    Blythe Stanford, PT DPT 10/24/2020, 10:25 AM  [place] [facility] [address] [place], [address] Phone: [phone]   Fax:  [phone]  Name: ROXANN VIERRA MRN: [id number] Date of Birth: 03-18-1951

## 2020-10-25 ENCOUNTER — Encounter: Payer: Self-pay | Admitting: Obstetrics and Gynecology

## 2020-10-25 ENCOUNTER — Ambulatory Visit (INDEPENDENT_AMBULATORY_CARE_PROVIDER_SITE_OTHER): Payer: Medicare Other | Admitting: Obstetrics and Gynecology

## 2020-10-25 ENCOUNTER — Other Ambulatory Visit: Payer: Self-pay

## 2020-10-25 VITALS — BP 172/79 | HR 82 | Ht 65.0 in | Wt 247.5 lb

## 2020-10-25 DIAGNOSIS — Z1239 Encounter for other screening for malignant neoplasm of breast: Secondary | ICD-10-CM

## 2020-10-25 DIAGNOSIS — N951 Menopausal and female climacteric states: Secondary | ICD-10-CM

## 2020-10-25 NOTE — Progress Notes (Signed)
HPI:      [name] is a 70 y.o. G2P1011 who LMP was Patient's last menstrual period was 01/26/2017.  Subjective:   She presents today for her annual examination.  She continues to have occasional hot flashes but she says that these can occur twice in 1 week and then she skipped several weeks without any occurrences.  She says that mainly occur at night.  She has tried black cohosh and finds some relief with this medication. She has plans to have her other knee replaced in June-she is excited about this prospect.    Hx: The following portions of the patient's history were reviewed and updated as appropriate:             She  has a past medical history of Abnormal Pap smear of cervix, Anxiety, Arthritis, Chronic kidney disease, Depression, Diverticulitis, Dyspnea, Dysrhythmia, unilateral nephrectomy (1979), Hypertension, Lower extremity edema, Palpitations, PMB (postmenopausal bleeding), Restless leg syndrome, Sleep apnea, and Vitamin D deficiency. She does not have any pertinent problems on file. She  has a past surgical history that includes Kidney surgery (Left, 1979); Wrist arthroscopy (Left, 1970s); Hysteroscopy with D & C (N/A, 02/16/2017); LEFT HEART CATH AND CORONARY ANGIOGRAPHY (N/A, 10/21/2017); Total knee arthroplasty (Left, 07/06/2017); kidney removal (Left); Joint replacement; Cholecystectomy; Colonoscopy with propofol (N/A, 06/13/2019); Esophagogastroduodenoscopy (egd) with propofol (N/A, 06/13/2019); Cardiac catheterization; Colonoscopy with propofol (N/A, 08/12/2019); Colonoscopy with propofol (N/A, 09/19/2019); Upper esophageal endoscopic ultrasound (eus) (N/A, 10/31/2019); Esophagogastroduodenoscopy (egd) with propofol (N/A, 10/31/2019); polypectomy (10/31/2019); biopsy (10/31/2019); Hemostasis clip placement (10/31/2019); Dilation and curettage of uterus; and XI robotic assisted ventral hernia (N/A, 12/01/2019). Her family history includes Breast cancer in her sister; Cancer in her father;  Diabetes in her father and sister; Hypertension in her father; Ovarian cancer in her mother. She  reports that she has never smoked. She has never used smokeless tobacco. She reports current alcohol use of about 2.0 standard drinks of alcohol per week. She reports current drug use. Frequency: 3.00 times per week. Drugs: Marijuana and Cocaine. She has a current medication list which includes the following prescription(s): albuterol, amlodipine, aspirin ec, carvedilol, vitamin d, voltaren, ezetimibe, gabapentin, orlistat, ropinirole, fluconazole, OVER THE COUNTER MEDICATION, and tramadol. She is allergic to enalapril and lisinopril.       Review of Systems:  Review of Systems  Constitutional: Denied constitutional symptoms, night sweats, recent illness, fatigue, fever, insomnia and weight loss.  Eyes: Denied eye symptoms, eye pain, photophobia, vision change and visual disturbance.  Ears/Nose/Throat/Neck: Denied ear, nose, throat or neck symptoms, hearing loss, nasal discharge, sinus congestion and sore throat.  Cardiovascular: Denied cardiovascular symptoms, arrhythmia, chest pain/pressure, edema, exercise intolerance, orthopnea and palpitations.  Respiratory: Denied pulmonary symptoms, asthma, pleuritic pain, productive sputum, cough, dyspnea and wheezing.  Gastrointestinal: Denied, gastro-esophageal reflux, melena, nausea and vomiting.  Genitourinary: Denied genitourinary symptoms including symptomatic vaginal discharge, pelvic relaxation issues, and urinary complaints.  Musculoskeletal: Denied musculoskeletal symptoms, stiffness, swelling, muscle weakness and myalgia.  Dermatologic: Denied dermatology symptoms, rash and scar.  Neurologic: Denied neurology symptoms, dizziness, headache, neck pain and syncope.  Psychiatric: Denied psychiatric symptoms, anxiety and depression.  Endocrine: Denied endocrine symptoms including hot flashes and night sweats.   Meds:   Current Outpatient Medications  on File Prior to Visit  Medication Sig Dispense Refill  . albuterol (VENTOLIN HFA) 108 (90 Base) MCG/ACT inhaler Inhale 2 puffs into the lungs every 4 (four) hours as needed for wheezing or shortness of breath. 1 each 3  .  amLODipine (NORVASC) 5 MG tablet Take 1 tablet (5 mg total) by mouth 2 (two) times daily. 60 tablet 2  . aspirin EC 81 MG tablet Take 81 mg by mouth daily.     . carvedilol (COREG) 3.125 MG tablet TAKE 2 TABLETS(6.25 MG) BY MOUTH TWICE DAILY WITH A MEAL (Patient taking differently: Take 6.25 mg by mouth in the morning and at bedtime.) 120 tablet 3  . Cholecalciferol (VITAMIN D) 50 MCG (2000 UT) tablet Take 2,000 Units by mouth in the morning and at bedtime.     . diclofenac Sodium (VOLTAREN) 1 % GEL Apply 1 application topically 4 (four) times daily as needed (pain).     Marland Kitchen ezetimibe (ZETIA) 10 MG tablet Take 1 tablet (10 mg total) by mouth daily. 90 tablet 3  . gabapentin (NEURONTIN) 100 MG capsule Take 100 mg by mouth 2 (two) times daily.    Marland Kitchen orlistat (ALLI) 60 MG capsule Take 60 mg by mouth 3 (three) times daily with meals.    Marland Kitchen rOPINIRole (REQUIP) 0.5 MG tablet Take 0.5-1 mg by mouth 2 (two) times daily as needed (pain).    . fluconazole (DIFLUCAN) 150 MG tablet Take 1 tab po every other day (Patient not taking: Reported on 10/25/2020) 3 tablet 0  . OVER THE COUNTER MEDICATION Apply 1 application topically in the morning and at bedtime. Tummy cream (Patient not taking: Reported on 10/25/2020)    . traMADol (ULTRAM) 50 MG tablet Take one tab po bid for pain (Patient not taking: Reported on 10/25/2020) 30 tablet 0   No current facility-administered medications on file prior to visit.          Objective:     Vitals:   10/25/20 0804  BP: (!) 172/79  Pulse: 82    Filed Weights   10/25/20 0804  Weight: 247 lb 8 oz (112.3 kg)              Physical examination General NAD, Conversant  HEENT Atraumatic; Op clear with mmm.  Normo-cephalic. Pupils reactive. Anicteric  sclerae  Thyroid/Neck Smooth without nodularity or enlargement. Normal ROM.  Neck Supple.  Skin No rashes, lesions or ulceration. Normal palpated skin turgor. No nodularity.  Breasts: No masses or discharge.  Symmetric.  No axillary adenopathy.  Lungs: Clear to auscultation.No rales or wheezes. Normal Respiratory effort, no retractions.  Heart: NSR.  No murmurs or rubs appreciated. No periferal edema  Abdomen: Soft.  Non-tender.  No masses.  No HSM. No hernia  Extremities: Moves all appropriately.  Normal ROM for age. No lymphadenopathy.  Neuro: Oriented to PPT.  Normal mood. Normal affect.     Pelvic:   Vulva: Normal appearance.  No lesions.  Vagina: No lesions or abnormalities noted.  Support: Normal pelvic support.  Urethra No masses tenderness or scarring.  Meatus Normal size without lesions or prolapse.  Cervix: Normal appearance.  No lesions.  Anus: Normal exam.  No lesions.  Perineum: Normal exam.  No lesions.        Bimanual   Uterus: Normal size.  Non-tender.  Mobile.  AV.  Adnexae: No masses.  Non-tender to palpation.  Cul-de-sac: Negative for abnormality.    Exam limited by patient body habitus.  Assessment:    G2P1011 Patient Active Problem List   Diagnosis Date Noted  . Mild intermittent asthma without complication 50/04/3817  . Shortness of breath 08/08/2020  . Vaginal candidiasis 08/08/2020  . Atherosclerosis of aorta ([place]) 07/09/2020  . Acute diverticulitis 05/13/2020  .  Fever blister 10/15/2019  . Cerumen in auditory canal on examination 10/15/2019  . Bilateral hearing loss 10/15/2019  . Need for vaccination against Streptococcus pneumoniae using pneumococcal conjugate vaccine 7 10/15/2019  . Encounter for screening mammogram for malignant neoplasm of breast 10/15/2019  . Screening for osteoporosis 10/15/2019  . Elevated erythrocyte sedimentation rate 11/02/2018  . Lumbar spondylosis 11/02/2018  . PVC's (premature ventricular contractions) 10/18/2018  .  Primary osteoarthritis of right knee 09/15/2018  . Encounter for pre-operative examination 09/15/2018  . Obstructive sleep apnea 09/15/2018  . Calculus of gallbladder without cholecystitis without obstruction 09/15/2018  . Calculus of gallbladder with cholecystitis without biliary obstruction 01/27/2018  . Restless leg syndrome 01/27/2018  . Generalized abdominal pain 01/11/2018  . Cyst of pancreas 01/11/2018  . Other specified disorders of kidney and ureter 01/11/2018  . Primary insomnia 01/11/2018  . Paroxysmal atrial fibrillation ([place]) 11/12/2017  . Essential hypertension 10/25/2017  . Mixed hyperlipidemia 10/25/2017  . Low back pain at multiple sites 10/25/2017  . Dysuria 10/25/2017  . Encounter for general adult medical examination with abnormal findings 10/25/2017  . Vitamin D deficiency 10/25/2017  . Near syncope   . Symptomatic bradycardia 10/19/2017  . Carpal tunnel syndrome 10/12/2017  . Primary osteoarthritis of left knee 07/09/2017  . History of total knee arthroplasty 07/06/2017  . Chest pain 06/29/2017  . Postop check 02/25/2017  . Impingement syndrome of shoulder region 01/20/2017  . Neck pain 01/20/2017  . Obesity (BMI 35.0-39.9 without comorbidity) 07/15/2016  . Endometrial polyp 07/15/2016  . Morbid obesity ([place]) 07/15/2016  . Family history of breast cancer in first degree relative 07/15/2016  . Family history of ovarian cancer 07/15/2016  . Knee pain 07/04/2016  . Bilateral leg pain 06/24/2016     1. Symptomatic menopausal or female climacteric states   2. Breast screening        Plan:            1.  Basic Screening Recommendations The basic screening recommendations for asymptomatic women were discussed with the patient during her visit.  The age-appropriate recommendations were discussed with her and the rational for the tests reviewed.  When I am informed by the patient that another primary care physician has previously obtained the age-appropriate  tests and they are up-to-date, only outstanding tests are ordered and referrals given as necessary.  Abnormal results of tests will be discussed with her when all of her results are completed.  Routine preventative health maintenance measures emphasized: Exercise/Diet/Weight control, Tobacco Warnings, Alcohol/Substance use risks and Stress Management Mammogram ordered -labs by PCP -aged out of Pap smears 2.  Discussed hot flashes and menopause in some detail.  Patient plans to continue to use black cohosh.  Orders Orders Placed This Encounter  Procedures  . MM DIGITAL SCREENING BILATERAL    No orders of the defined types were placed in this encounter.      I spent 24 minutes involved in the care of this patient preparing to see the patient by obtaining and reviewing her medical history (including labs, imaging tests and prior procedures), documenting clinical information in the electronic health record (EHR), counseling and coordinating care plans, writing and sending prescriptions, ordering tests or procedures and directly communicating with the patient by discussing pertinent items from her history and physical exam as well as detailing my assessment and plan as noted above so that she has an informed understanding.  All of her questions were answered.      F/U  Return in  about 1 year (around 10/25/2021) for Annual Physical.  Finis Bud, M.D. 10/25/2020 8:46 AM

## 2020-10-26 ENCOUNTER — Encounter: Payer: Medicare Other | Admitting: Obstetrics and Gynecology

## 2020-10-29 ENCOUNTER — Ambulatory Visit: Payer: Medicare Other

## 2020-10-31 ENCOUNTER — Other Ambulatory Visit: Payer: Self-pay

## 2020-10-31 ENCOUNTER — Ambulatory Visit: Payer: Medicare Other

## 2020-10-31 DIAGNOSIS — M25552 Pain in left hip: Secondary | ICD-10-CM

## 2020-10-31 DIAGNOSIS — M25661 Stiffness of right knee, not elsewhere classified: Secondary | ICD-10-CM

## 2020-10-31 DIAGNOSIS — G8929 Other chronic pain: Secondary | ICD-10-CM

## 2020-10-31 DIAGNOSIS — M6281 Muscle weakness (generalized): Secondary | ICD-10-CM | POA: Diagnosis not present

## 2020-10-31 DIAGNOSIS — M25561 Pain in right knee: Secondary | ICD-10-CM | POA: Diagnosis not present

## 2020-10-31 NOTE — Therapy (Signed)
[place] [facility] [address] [place], [address] Phone: [phone]   Fax:  [phone]  Physical Therapy Treatment  Patient Details  Name: Whitney [name] MRN: [id number] Date of Birth: [birth year] Referring Provider (PT): Kurtis Bushman MD   Encounter Date: 10/31/2020   PT End of Session - 10/31/20 1702    Visit Number 15    Number of Visits 21    Date for PT Re-Evaluation 11/29/20    Authorization Type UHC MCR    Authorization Time Period 07/10/20-09/04/20    PT Start Time 0945    PT Stop Time 1030    PT Time Calculation (min) 45 min    Activity Tolerance Patient tolerated treatment well;Patient limited by pain    Behavior During Therapy [facility] for tasks assessed/performed           Past Medical History:  Diagnosis Date  . Abnormal Pap smear of cervix    Pt states she had colposcopy   . Anxiety   . Arthritis   . Chronic kidney disease    removed left kidney  . Depression   . Diverticulitis   . Dyspnea   . Dysrhythmia   . Hx of unilateral nephrectomy 1979   Left Nephrectomy  . Hypertension   . Lower extremity edema   . Palpitations   . PMB (postmenopausal bleeding)   . Restless leg syndrome   . Sleep apnea    has no cpap  . Vitamin D deficiency     Past Surgical History:  Procedure Laterality Date  . BIOPSY  10/31/2019   Procedure: BIOPSY;  Surgeon: Rush Landmark Telford Nab., MD;  Location: [place];  Service: Gastroenterology;;  . CARDIAC CATHETERIZATION    . CHOLECYSTECTOMY    . COLONOSCOPY WITH PROPOFOL N/A 06/13/2019   Procedure: COLONOSCOPY WITH PROPOFOL;  Surgeon: [name], MD;  Location: [facility] ENDOSCOPY;  Service: Gastroenterology;  Laterality: N/A;  . COLONOSCOPY WITH PROPOFOL N/A 08/12/2019   Procedure: COLONOSCOPY WITH PROPOFOL;  Surgeon: [name], MD;  Location: [facility] ENDOSCOPY;  Service: Gastroenterology;  Laterality: N/A;  . COLONOSCOPY WITH PROPOFOL N/A 09/19/2019   Procedure:  COLONOSCOPY WITH PROPOFOL;  Surgeon: [name], MD;  Location: [facility] ENDOSCOPY;  Service: Gastroenterology;  Laterality: N/A;  . DILATION AND CURETTAGE OF UTERUS    . ESOPHAGOGASTRODUODENOSCOPY (EGD) WITH PROPOFOL N/A 06/13/2019   Procedure: ESOPHAGOGASTRODUODENOSCOPY (EGD) WITH PROPOFOL;  Surgeon: [name], MD;  Location: [facility] ENDOSCOPY;  Service: Gastroenterology;  Laterality: N/A;  Pt will go for COVID test on 42-7-06 as she uses public transportation and will be in the area on that day.   . ESOPHAGOGASTRODUODENOSCOPY (EGD) WITH PROPOFOL N/A 10/31/2019   Procedure: ESOPHAGOGASTRODUODENOSCOPY (EGD) WITH PROPOFOL;  Surgeon: Rush Landmark Telford Nab., MD;  Location: [place];  Service: Gastroenterology;  Laterality: N/A;  . HEMOSTASIS CLIP PLACEMENT  10/31/2019   Procedure: HEMOSTASIS CLIP PLACEMENT;  Surgeon: Irving Copas., MD;  Location: [place];  Service: Gastroenterology;;  . HYSTEROSCOPY WITH D & C N/A 02/16/2017   Procedure: DILATATION AND CURETTAGE [place];  Surgeon: Brayton Mars, MD;  Location: [facility];  Service: Gynecology;  Laterality: N/A;  . JOINT REPLACEMENT    . kidney removal Left   . KIDNEY SURGERY Left 1979   left kidney removed  . LEFT HEART CATH AND CORONARY ANGIOGRAPHY N/A 10/21/2017   Procedure: LEFT HEART CATH AND CORONARY ANGIOGRAPHY;  Surgeon: Teodoro Spray, MD;  Location: [place] CV LAB;  Service: Cardiovascular;  Laterality: N/A;  . POLYPECTOMY  10/31/2019   Procedure: POLYPECTOMY;  Surgeon: Rush Landmark Telford Nab., MD;  Location: [place];  Service: Gastroenterology;;  . TOTAL KNEE ARTHROPLASTY Left 07/06/2017   Procedure: TOTAL KNEE ARTHROPLASTY;  Surgeon: Lovell Sheehan, MD;  Location: [facility];  Service: Orthopedics;  Laterality: Left;  . UPPER ESOPHAGEAL ENDOSCOPIC ULTRASOUND (EUS) N/A 10/31/2019   Procedure: UPPER ESOPHAGEAL ENDOSCOPIC ULTRASOUND (EUS);  Surgeon: Irving Copas., MD;  Location: [place];   Service: Gastroenterology;  Laterality: N/A;  . WRIST ARTHROSCOPY Left 1970s  . XI ROBOTIC ASSISTED VENTRAL HERNIA N/A 12/01/2019   Procedure: XI ROBOTIC ASSISTED VENTRAL HERNIA;  Surgeon: Jules Husbands, MD;  Location: [facility];  Service: General;  Laterality: N/A;    There were no vitals filed for this visit.   Subjective Assessment - 10/31/20 1635    Subjective Patient states she has been awake since 4am secondary to her cousin needing to go into the hospital and experiencing increased stress.    Pertinent History s/p Left TKA 07/06/2017; right knee arthritis with weakness as well with reports of right and left leg "giving way" intermittently; Going to have TKA 6/10    Limitations Walking;Standing;House hold activities;Other (comment)    How long can you sit comfortably? No Limit    How long can you stand comfortably? 2-3 hours    How long can you walk comfortably? 2-3 hours    Diagnostic tests imaging - see scanned docs    Patient Stated Goals Improve motion and strength of  hips and R knee and being able to walk with less/no pain.    Currently in Pain? No/denies    Pain Onset More than a month ago             TREATMENT Therapeutic Exercise NuStep Level 1 - x 10 min performed in attempts to de-stress and lower BP  BP taken throughout session: 168/156mmHg - At beginning of session 156/92 mmHg -- after 5 min of rest 170/60mmHg - After 2 min of Nustep 162/94 mmHg - After 6 min of Nustep  Performed BP measurements to assess CV function during therapy session    PT Education - 10/31/20 1702    Education provided Yes    Education Details form/technique with exercise    Person(s) Educated Patient    Methods Demonstration;Explanation    Comprehension Verbalized understanding;Returned demonstration            PT Short Term Goals - 07/10/20 1420      PT SHORT TERM GOAL #1   Title Patient will demonstrate independence with HEP to maximize rehab potential.    Time 3     Period Weeks    Status Achieved    Target Date 04/25/20             PT Long Term Goals - 09/24/20 1319      PT LONG TERM GOAL #1   Title Patient will demonstrate independence with progressive HEP to maintain progress made during therapy.    Baseline needs updates to inlcude knee ext ROM; 09/24/2020: dependent    Time 6    Period Weeks    Status On-going      PT LONG TERM GOAL #2   Title Patient will improve FOTO score to 58 to show an improvement in patients ability to perform functional activities.    Baseline 50 at Eval; 05/23/20: 55; 53 on 12/7; 09/24/2020: 49    Time 8    Period Weeks  Status On-going      PT LONG TERM GOAL #3   Title Patient will have a worst pain of 3/10 to indicate significant improvement with pain and ability to perform functional activities more functionally.     Baseline 10/10; 05/23/20: 5/10; 09/24/2020: 8/10    Time 8    Period Weeks    Status On-going      PT LONG TERM GOAL #4   Title After 8 weeks pt will demonstrate tolerance of 6MWT c distance >1077ft c LRAD.    Baseline using SPC, intermittent stumbles; 09/24/2020; 1150ft    Time 8    Period Weeks    Status New                 Plan - 10/31/20 1702    Clinical Impression Statement Session limited today secondary to elevated BP. At beginning of session BP 168/100 mmHg afer rest it drops to 156/92 mmHg. Progressed to perform light exercises such as the NuStep without resistance. After NuStep: Patinet's BP was 162/94; ended session secondary continued heightened Diastolic measurement. Educated patient to go to hospital if symptoms begin; but patient is symptomless and stable by end of session.    Personal Factors and Comorbidities Age;Comorbidity 2    Comorbidities HBP, Obesity    Examination-Activity Limitations Stairs;Locomotion Level;Stand    Examination-Participation Restrictions Shop    Stability/Clinical Decision Making Evolving/Moderate complexity    Rehab Potential Good     Clinical Impairments Affecting Rehab Potential (+) motivated, chronic condition (-) arthritis right knee with pain    PT Frequency 2x / week    PT Duration 6 weeks    PT Treatment/Interventions Electrical Stimulation;Cryotherapy;Moist Heat;Gait training;Stair training;Therapeutic activities;Therapeutic exercise;Balance training;Patient/family education;Neuromuscular re-education;Manual techniques;Passive range of motion;Dry needling;Spinal Manipulations;Joint Manipulations;Functional mobility training;Traction    PT Next Visit Plan Monitor BP throughout session and continue POC; obtain a scribpt for graded compression RLE thigh high    PT Home Exercise Plan Heel Raises, standing hip abduction    Consulted and Agree with Plan of Care Patient           Patient will benefit from skilled therapeutic intervention in order to improve the following deficits and impairments:  Pain,Decreased activity tolerance,Decreased endurance,Decreased range of motion,Decreased strength,Impaired perceived functional ability,Difficulty walking,Decreased mobility,Obesity  Visit Diagnosis: Muscle weakness (generalized)  Stiffness of right knee, not elsewhere classified  Chronic pain of right knee  Pain in left hip     Problem List Patient Active Problem List   Diagnosis Date Noted  . Mild intermittent asthma without complication 02/54/2706  . Shortness of breath 08/08/2020  . Vaginal candidiasis 08/08/2020  . Atherosclerosis of aorta ([place]) 07/09/2020  . Acute diverticulitis 05/13/2020  . Fever blister 10/15/2019  . Cerumen in auditory canal on examination 10/15/2019  . Bilateral hearing loss 10/15/2019  . Need for vaccination against Streptococcus pneumoniae using pneumococcal conjugate vaccine 7 10/15/2019  . Encounter for screening mammogram for malignant neoplasm of breast 10/15/2019  . Screening for osteoporosis 10/15/2019  . Elevated erythrocyte sedimentation rate 11/02/2018  . Lumbar  spondylosis 11/02/2018  . PVC's (premature ventricular contractions) 10/18/2018  . Primary osteoarthritis of right knee 09/15/2018  . Encounter for pre-operative examination 09/15/2018  . Obstructive sleep apnea 09/15/2018  . Calculus of gallbladder without cholecystitis without obstruction 09/15/2018  . Calculus of gallbladder with cholecystitis without biliary obstruction 01/27/2018  . Restless leg syndrome 01/27/2018  . Generalized abdominal pain 01/11/2018  . Cyst of pancreas 01/11/2018  . Other specified disorders of kidney  and ureter 01/11/2018  . Primary insomnia 01/11/2018  . Paroxysmal atrial fibrillation ([place]) 11/12/2017  . Essential hypertension 10/25/2017  . Mixed hyperlipidemia 10/25/2017  . Low back pain at multiple sites 10/25/2017  . Dysuria 10/25/2017  . Encounter for general adult medical examination with abnormal findings 10/25/2017  . Vitamin D deficiency 10/25/2017  . Near syncope   . Symptomatic bradycardia 10/19/2017  . Carpal tunnel syndrome 10/12/2017  . Primary osteoarthritis of left knee 07/09/2017  . History of total knee arthroplasty 07/06/2017  . Chest pain 06/29/2017  . Postop check 02/25/2017  . Impingement syndrome of shoulder region 01/20/2017  . Neck pain 01/20/2017  . Obesity (BMI 35.0-39.9 without comorbidity) 07/15/2016  . Endometrial polyp 07/15/2016  . Morbid obesity ([place]) 07/15/2016  . Family history of breast cancer in first degree relative 07/15/2016  . Family history of ovarian cancer 07/15/2016  . Knee pain 07/04/2016  . Bilateral leg pain 06/24/2016    Blythe Stanford, PT DPT 10/31/2020, 5:14 PM  [place] [facility] [address], [place], [address] Phone: [phone]   Fax:  [phone]  Name: Whitney [name] MRN: [id number] Date of Birth: [birth year]

## 2020-11-02 DIAGNOSIS — M5136 Other intervertebral disc degeneration, lumbar region: Secondary | ICD-10-CM | POA: Diagnosis not present

## 2020-11-02 DIAGNOSIS — M48062 Spinal stenosis, lumbar region with neurogenic claudication: Secondary | ICD-10-CM | POA: Diagnosis not present

## 2020-11-02 DIAGNOSIS — M5416 Radiculopathy, lumbar region: Secondary | ICD-10-CM | POA: Diagnosis not present

## 2020-11-05 ENCOUNTER — Other Ambulatory Visit: Payer: Self-pay

## 2020-11-05 ENCOUNTER — Ambulatory Visit: Payer: Medicare Other | Attending: Orthopedic Surgery

## 2020-11-05 DIAGNOSIS — M25561 Pain in right knee: Secondary | ICD-10-CM | POA: Insufficient documentation

## 2020-11-05 DIAGNOSIS — G8929 Other chronic pain: Secondary | ICD-10-CM | POA: Insufficient documentation

## 2020-11-05 DIAGNOSIS — M25661 Stiffness of right knee, not elsewhere classified: Secondary | ICD-10-CM | POA: Insufficient documentation

## 2020-11-05 DIAGNOSIS — M6281 Muscle weakness (generalized): Secondary | ICD-10-CM | POA: Diagnosis not present

## 2020-11-05 DIAGNOSIS — M25552 Pain in left hip: Secondary | ICD-10-CM | POA: Insufficient documentation

## 2020-11-05 NOTE — Therapy (Signed)
[place] [facility] [address] [place], [address] Phone: [phone]   Fax:  [phone]  Physical Therapy Treatment  Patient Details  Name: Whitney [name] MRN: [id number] Date of Birth: 1950/11/20 Referring Provider (PT): Kurtis Bushman MD   Encounter Date: 11/05/2020   PT End of Session - 11/05/20 1421    Visit Number 16    Number of Visits 21    Date for PT Re-Evaluation 11/29/20    Authorization Type UHC MCR    PT Start Time 1410    PT Stop Time 1455    PT Time Calculation (min) 45 min    Activity Tolerance Patient tolerated treatment well;Patient limited by pain    Behavior During Therapy [facility] for tasks assessed/performed           Past Medical History:  Diagnosis Date  . Abnormal Pap smear of cervix    Pt states she had colposcopy   . Anxiety   . Arthritis   . Chronic kidney disease    removed left kidney  . Depression   . Diverticulitis   . Dyspnea   . Dysrhythmia   . Hx of unilateral nephrectomy 1979   Left Nephrectomy  . Hypertension   . Lower extremity edema   . Palpitations   . PMB (postmenopausal bleeding)   . Restless leg syndrome   . Sleep apnea    has no cpap  . Vitamin D deficiency     Past Surgical History:  Procedure Laterality Date  . BIOPSY  10/31/2019   Procedure: BIOPSY;  Surgeon: Rush Landmark Telford Nab., MD;  Location: [place];  Service: Gastroenterology;;  . CARDIAC CATHETERIZATION    . CHOLECYSTECTOMY    . COLONOSCOPY WITH PROPOFOL N/A 06/13/2019   Procedure: COLONOSCOPY WITH PROPOFOL;  Surgeon: [name], MD;  Location: [facility] ENDOSCOPY;  Service: Gastroenterology;  Laterality: N/A;  . COLONOSCOPY WITH PROPOFOL N/A 08/12/2019   Procedure: COLONOSCOPY WITH PROPOFOL;  Surgeon: [name], MD;  Location[facility]OSCOPY;  Service: Gastroenterology;  Laterality: N/A;  . COLONOSCOPY WITH PROPOFOL N/A 09/19/2019   Procedure: COLONOSCOPY WITH PROPOFOL;  Surgeon: [name], MD;  Location: [facility] ENDOSCOPY;  Service: Gastroenterology;  Laterality: N/A;  . DILATION AND CURETTAGE OF UTERUS    . ESOPHAGOGASTRODUODENOSCOPY (EGD) WITH PROPOFOL N/A 06/13/2019   Procedure: ESOPHAGOGASTRODUODENOSCOPY (EGD) WITH PROPOFOL;  Surgeon: [name], MD;  Location: [facility] ENDOSCOPY;  Service: Gastroenterology;  Laterality: N/A;  Pt will go for COVID test on 88-5-02 as she uses public transportation and will be in the area on that day.   . ESOPHAGOGASTRODUODENOSCOPY (EGD) WITH PROPOFOL N/A 10/31/2019   Procedure: ESOPHAGOGASTRODUODENOSCOPY (EGD) WITH PROPOFOL;  Surgeon: Rush Landmark Telford Nab., MD;  Location: [place];  Service: Gastroenterology;  Laterality: N/A;  . HEMOSTASIS CLIP PLACEMENT  10/31/2019   Procedure: HEMOSTASIS CLIP PLACEMENT;  Surgeon: Irving Copas., MD;  Location: [place];  Service: Gastroenterology;;  . HYSTEROSCOPY WITH D & C N/A 02/16/2017   Procedure: DILATATION AND CURETTAGE Pol[place]ann Glen;  Surgeon: Brayton Mars, MD;  Location: [facility];  Service: Gynecology;  Laterality: N/A;  . JOINT REPLACEMENT    . kidney removal Left   . KIDNEY SURGERY Left 1979   left kidney removed  . LEFT HEART CATH AND CORONARY ANGIOGRAPHY N/A 10/21/2017   Procedure: LEFT HEART CATH AND CORONARY ANGIOGRAPHY;  Surgeon: Teodoro Spray, MD;  Location: [place] CV LAB;  Service: Cardiovascular;  Laterality: N/A;  . POLYPECTOMY  10/31/2019  Procedure: POLYPECTOMY;  Surgeon: Rush Landmark Telford Nab., MD;  Location: [place];  Service: Gastroenterology;;  . TOTAL KNEE ARTHROPLASTY Left 07/06/2017   Procedure: TOTAL KNEE ARTHROPLASTY;  Surgeon: Lovell Sheehan, MD;  Location: [facility];  Service: Orthopedics;  Laterality: Left;  . UPPER ESOPHAGEAL ENDOSCOPIC ULTRASOUND (EUS) N/A 10/31/2019   Procedure: UPPER ESOPHAGEAL ENDOSCOPIC ULTRASOUND (EUS);  Surgeon: Irving Copas., MD;  Location: [place];  Service: Gastroenterology;  Laterality: N/A;  .  WRIST ARTHROSCOPY Left 1970s  . XI ROBOTIC ASSISTED VENTRAL HERNIA N/A 12/01/2019   Procedure: XI ROBOTIC ASSISTED VENTRAL HERNIA;  Surgeon: Jules Husbands, MD;  Location: [facility];  Service: General;  Laterality: N/A;    There were no vitals filed for this visit.   Subjective Assessment - 11/05/20 1417    Subjective Patient states she has been having intermittent  L knee pain today. Patient states she would like to go over some cervical exercises.    Pertinent History s/p Left TKA 07/06/2017; right knee arthritis with weakness as well with reports of right and left leg "giving way" intermittently; Going to have TKA 6/10    Limitations Walking;Standing;House hold activities;Other (comment)    How long can you sit comfortably? No Limit    How long can you stand comfortably? 2-3 hours    How long can you walk comfortably? 2-3 hours    Diagnostic tests imaging - see scanned docs    Patient Stated Goals Improve motion and strength of  hips and R knee and being able to walk with less/no pain.    Currently in Pain? No/denies    Pain Onset More than a month ago           TREATMENT Therapeutic Exercise Scapular retraction in sitting -- 2 x 10  Cervical retraction in sitting --  x10 SNAGS Cervical extension -- x 10  Leg Press in sitting -- 3 x 10 155# Deadlift with 20# kb -- x10 Sit to stand -- 3 x 10 20# kb Nustep in sitting level 5 -- x 10 min with focus on speed    Performed exercises to address pain and spasms    PT Education - 11/05/20 1420    Education provided Yes    Education Details form/technique with exercise    Person(s) Educated Patient    Methods Explanation;Demonstration    Comprehension Verbalized understanding;Returned demonstration            PT Short Term Goals - 07/10/20 1420      PT SHORT TERM GOAL #1   Title Patient will demonstrate independence with HEP to maximize rehab potential.    Time 3    Period Weeks    Status Achieved    Target Date 04/25/20              PT Long Term Goals - 09/24/20 1319      PT LONG TERM GOAL #1   Title Patient will demonstrate independence with progressive HEP to maintain progress made during therapy.    Baseline needs updates to inlcude knee ext ROM; 09/24/2020: dependent    Time 6    Period Weeks    Status On-going      PT LONG TERM GOAL #2   Title Patient will improve FOTO score to 58 to show an improvement in patients ability to perform functional activities.    Baseline 50 at Eval; 05/23/20: 55; 53 on 12/7; 09/24/2020: 49    Time 8    Period Weeks  Status On-going      PT LONG TERM GOAL #3   Title Patient will have a worst pain of 3/10 to indicate significant improvement with pain and ability to perform functional activities more functionally.     Baseline 10/10; 05/23/20: 5/10; 09/24/2020: 8/10    Time 8    Period Weeks    Status On-going      PT LONG TERM GOAL #4   Title After 8 weeks pt will demonstrate tolerance of 6MWT c distance >1041ft c LRAD.    Baseline using SPC, intermittent stumbles; 09/24/2020; 1123ft    Time 8    Period Weeks    Status New                 Plan - 11/05/20 1427    Clinical Impression Statement BP is 156/92 mmHg and continued treatment. Continued to focus on improving Lower Extremity Strength with use of the Nustep, squats and Deadlifts. Screened cervical spine with inconclusive results. TTP along UT, suboccipital muscular, and SCM. Compression test positive, distraction test negative high SINS level with allodynia along the C5-7 on the L side. Increased pain with cervical extension, cervical side bending on R side and cervical flexion with O. Patient will benefit from further skilled therapy to return to prior level of function.    Personal Factors and Comorbidities Age;Comorbidity 2    Comorbidities HBP, Obesity    Examination-Activity Limitations Stairs;Locomotion Level;Stand    Examination-Participation Restrictions Shop    Stability/Clinical  Decision Making Evolving/Moderate complexity    Rehab Potential Good    Clinical Impairments Affecting Rehab Potential (+) motivated, chronic condition (-) arthritis right knee with pain    PT Frequency 2x / week    PT Duration 6 weeks    PT Treatment/Interventions Electrical Stimulation;Cryotherapy;Moist Heat;Gait training;Stair training;Therapeutic activities;Therapeutic exercise;Balance training;Patient/family education;Neuromuscular re-education;Manual techniques;Passive range of motion;Dry needling;Spinal Manipulations;Joint Manipulations;Functional mobility training;Traction    PT Next Visit Plan Monitor BP throughout session and continue POC; obtain a scribpt for graded compression RLE thigh high    PT Home Exercise Plan Heel Raises, standing hip abduction    Consulted and Agree with Plan of Care Patient           Patient will benefit from skilled therapeutic intervention in order to improve the following deficits and impairments:  Pain,Decreased activity tolerance,Decreased endurance,Decreased range of motion,Decreased strength,Impaired perceived functional ability,Difficulty walking,Decreased mobility,Obesity  Visit Diagnosis: Muscle weakness (generalized)  Stiffness of right knee, not elsewhere classified  Chronic pain of right knee     Problem List Patient Active Problem List   Diagnosis Date Noted  . Mild intermittent asthma without complication 83/15/1761  . Shortness of breath 08/08/2020  . Vaginal candidiasis 08/08/2020  . Atherosclerosis of aorta ([place]) 07/09/2020  . Acute diverticulitis 05/13/2020  . Fever blister 10/15/2019  . Cerumen in auditory canal on examination 10/15/2019  . Bilateral hearing loss 10/15/2019  . Need for vaccination against Streptococcus pneumoniae using pneumococcal conjugate vaccine 7 10/15/2019  . Encounter for screening mammogram for malignant neoplasm of breast 10/15/2019  . Screening for osteoporosis 10/15/2019  . Elevated  erythrocyte sedimentation rate 11/02/2018  . Lumbar spondylosis 11/02/2018  . PVC's (premature ventricular contractions) 10/18/2018  . Primary osteoarthritis of right knee 09/15/2018  . Encounter for pre-operative examination 09/15/2018  . Obstructive sleep apnea 09/15/2018  . Calculus of gallbladder without cholecystitis without obstruction 09/15/2018  . Calculus of gallbladder with cholecystitis without biliary obstruction 01/27/2018  . Restless leg syndrome 01/27/2018  . Generalized abdominal  pain 01/11/2018  . Cyst of pancreas 01/11/2018  . Other specified disorders of kidney and ureter 01/11/2018  . Primary insomnia 01/11/2018  . Paroxysmal atrial fibrillation ([place]) 11/12/2017  . Essential hypertension 10/25/2017  . Mixed hyperlipidemia 10/25/2017  . Low back pain at multiple sites 10/25/2017  . Dysuria 10/25/2017  . Encounter for general adult medical examination with abnormal findings 10/25/2017  . Vitamin D deficiency 10/25/2017  . Near syncope   . Symptomatic bradycardia 10/19/2017  . Carpal tunnel syndrome 10/12/2017  . Primary osteoarthritis of left knee 07/09/2017  . History of total knee arthroplasty 07/06/2017  . Chest pain 06/29/2017  . Postop check 02/25/2017  . Impingement syndrome of shoulder region 01/20/2017  . Neck pain 01/20/2017  . Obesity (BMI 35.0-39.9 without comorbidity) 07/15/2016  . Endometrial polyp 07/15/2016  . Morbid obesity ([place]) 07/15/2016  . Family history of breast cancer in first degree relative 07/15/2016  . Family history of ovarian cancer 07/15/2016  . Knee pain 07/04/2016  . Bilateral leg pain 06/24/2016    Blythe Stanford, PT DPT 11/05/2020, 2:54 PM  [place] [facility] [address], [place], [address] Phone: [phone]   Fax:  [phone]  Name: Whitney [name] MRN: [id number] Date of Birth: 06-01-1951

## 2020-11-07 ENCOUNTER — Ambulatory Visit: Payer: Medicare Other

## 2020-11-07 ENCOUNTER — Ambulatory Visit (INDEPENDENT_AMBULATORY_CARE_PROVIDER_SITE_OTHER): Payer: Medicare Other | Admitting: Gastroenterology

## 2020-11-07 ENCOUNTER — Other Ambulatory Visit: Payer: Self-pay

## 2020-11-07 ENCOUNTER — Encounter: Payer: Self-pay | Admitting: Gastroenterology

## 2020-11-07 VITALS — BP 144/89 | HR 94 | Temp 98.2°F | Wt 246.6 lb

## 2020-11-07 DIAGNOSIS — K31A0 Gastric intestinal metaplasia, unspecified: Secondary | ICD-10-CM

## 2020-11-07 DIAGNOSIS — Z1211 Encounter for screening for malignant neoplasm of colon: Secondary | ICD-10-CM | POA: Diagnosis not present

## 2020-11-07 DIAGNOSIS — R140 Abdominal distension (gaseous): Secondary | ICD-10-CM

## 2020-11-07 MED ORDER — NA SULFATE-K SULFATE-MG SULF 17.5-3.13-1.6 GM/177ML PO SOLN: 1.0000 | Freq: Once | ORAL | 0 refills | Status: AC

## 2020-11-07 NOTE — Progress Notes (Signed)
[name] MD, MRCP(U.K) [address]  [place], [place] [address]  Main: [phone]  Fax: [phone]   Primary Care Physician: Ronnell Freshwater, NP  Primary Gastroenterologist:  Dr. [name]   Chief Complaint  Patient presents with  . Abdominal Pain    Middle to LUQ with some nausea feeling     HPI: Whitney [name] is a 70 y.o. female    Summary of history :  Initially seen and referred on 3/31/2020for abdominal pain.Started after cholecystectomy  Abdominal CT scan in March 2019showed A small low-attenuation area within the body of the pancreas.   MRI of the abdomen in May 2019 which demonstrated benign liver abnormalities including multiple hemangiomas and cysts. Gallstone.'s multiple small cystic foci throughout the pancreas. Findings may represent small pseudocysts or areas of sidebranch duct ectasia. Cystic neoplasm of the pancreas not excluded follow-up imaging in 24 months recommended.  10/19/2018 HIDA scan: Normal. 02/18/2019: H pylori breath test - negative 03/04/2019: MRCP- progression of the gall bladder polyp which is 10 mm in size . Pancreas lesion is stable repeat in 1 year. 06/13/2019 colonoscopy:Large quantity of stool seen in the rectum and sigmoid repeat colonoscopy recommended.  EGD. Medium size hiatal hernia seen nodule seen just past the GE junction. Biopsies of the stomach demonstrated H. pylori gastritis.  Underwent laparoscopic cholecystectomy with Dr. Bethanie Dicker November 2020which resolved her longstanding abdominal pain  05/27/2019: CT scan of the abdomen and pelvis showed moderate supraumbilical midline ventral hernia containing inflamed flat and small amount of fluid. Stable small pancreatic cysts.   09/19/2019: Colonoscopy: Poor prep and 3 polyps 4 to 6 mm were resected in the ascending colon with a cold snare. Could not be retrieved since prep was poor  10/31/2019: EUS: Nodule at the GE junction was  resectedit was benign. Biopsies of stomach showed chronic gastritis with focal intestinal metaplasia. Cystic lesions of the pancreas were noted suggestive of IPMN.   12/01/2019: Ventral hernia repair. 02/15/2020: H. pylori breath test: Negative. 05/14/2020: Descending colon sigmoid colon diverticulitis   Interval history 06/04/2020-11/07/2020  Main complaint today is abdominal gas and bloating.  Gives her the appearance that she looks pregnant at times.  Abdominal discomfort is more significant when she is more bloated.  She passes flatus which is very foul-smelling.  She feels much better after passing gas.  She has regular bowel movements she states.  Loss of fruit and vegetable.  Consumes a lot of artificial sugars in her diet in terms of Stevia, diet cranberry juice. Denies any diarrhea.  The abdominal discomfort is generalized all over her abdomen.  Does not feel like what she had when she had her diverticulitis.     Current Outpatient Medications  Medication Sig Dispense Refill  . albuterol (VENTOLIN HFA) 108 (90 Base) MCG/ACT inhaler Inhale 2 puffs into the lungs every 4 (four) hours as needed for wheezing or shortness of breath. 1 each 3  . amLODipine (NORVASC) 5 MG tablet Take 1 tablet (5 mg total) by mouth 2 (two) times daily. 60 tablet 2  . aspirin EC 81 MG tablet Take 81 mg by mouth daily.     . carvedilol (COREG) 3.125 MG tablet TAKE 2 TABLETS(6.25 MG) BY MOUTH TWICE DAILY WITH A MEAL (Patient taking differently: Take 6.25 mg by mouth in the morning and at bedtime.) 120 tablet 3  . Cholecalciferol (VITAMIN D) 50 MCG (2000 UT) tablet Take 2,000 Units by mouth in the morning and at bedtime.     Marland Kitchen  diclofenac Sodium (VOLTAREN) 1 % GEL Apply 1 application topically 4 (four) times daily as needed (pain).     Marland Kitchen ezetimibe (ZETIA) 10 MG tablet Take 1 tablet (10 mg total) by mouth daily. 90 tablet 3  . fluconazole (DIFLUCAN) 150 MG tablet Take 1 tab po every other day 3 tablet 0  .  gabapentin (NEURONTIN) 100 MG capsule Take 100 mg by mouth 2 (two) times daily.    Marland Kitchen orlistat (ALLI) 60 MG capsule Take 60 mg by mouth 3 (three) times daily with meals.    Marland Kitchen OVER THE COUNTER MEDICATION Apply 1 application topically in the morning and at bedtime. Tummy cream    . rOPINIRole (REQUIP) 0.5 MG tablet Take 0.5-1 mg by mouth 2 (two) times daily as needed (pain).    . traMADol (ULTRAM) 50 MG tablet Take one tab po bid for pain (Patient not taking: No sig reported) 30 tablet 0   No current facility-administered medications for this visit.    Allergies as of 11/07/2020 - Review Complete 11/07/2020  Allergen Reaction Noted  . Enalapril Swelling   . Lisinopril Swelling 05/26/2016    ROS:  General: Negative for anorexia, weight loss, fever, chills, fatigue, weakness. ENT: Negative for hoarseness, difficulty swallowing , nasal congestion. CV: Negative for chest pain, angina, palpitations, dyspnea on exertion, peripheral edema.  Respiratory: Negative for dyspnea at rest, dyspnea on exertion, cough, sputum, wheezing.  GI: See history of present illness. GU:  Negative for dysuria, hematuria, urinary incontinence, urinary frequency, nocturnal urination.  Endo: Negative for unusual weight change.    Physical Examination:   BP (!) 144/89 (BP Location: Right Arm, Patient Position: Sitting, Cuff Size: Large)   Pulse 94   Temp 98.2 F (36.8 C) (Oral)   Wt 246 lb 9.6 oz (111.9 kg)   LMP 01/26/2017 Comment: age 88  SpO2 95%   BMI 41.04 kg/m   General: Well-nourished, well-developed in no acute distress.  Eyes: No icterus. Conjunctivae pink. Mouth: Oropharyngeal mucosa moist and pink , no lesions erythema or exudate. Lungs: Clear to auscultation bilaterally. Non-labored. Heart: Regular rate and rhythm, no murmurs rubs or gallops.  Abdomen: Bowel sounds are normal, nontender, nondistended, no hepatosplenomegaly or masses, no abdominal bruits or hernia , no rebound or guarding.    Extremities: No lower extremity edema. No clubbing or deformities. Neuro: Alert and oriented x 3.  Grossly intact. Skin: Warm and dry, no jaundice.   Psych: Alert and cooperative, normal mood and affect.   Imaging Studies: No results found.  Assessment and Plan:   Whitney [name] is a 70 y.o. y/o female here to follow up. She has a history of  abdominal pain that resolved after cholecystectomy when gallbladder polyps was seen on imaging.Previous abdominal imaging showed abnormalities of the pancreas andplan to repeat imaging in a year. H. pylori gastritis seen on EGDalong with gastric intestinal metaplasia and due for gastric mapping.  Recent complaints of abdominal bloating and distention which is very likely due to the excess amount of artificial sugars and sweeteners in her diet..  Plan :  1.Repeat colonoscopy due to poor prepand EGD to evaluate intestinal metaplasia for gastric mapping in Jan 2022 2. MRI pancreascan be postponed to January or February 2022.   .  Gas and bloating likely due to excess consumption of artificial sugars and sweeteners in diet which I told her to stop right away.  Try a diet with low FODMAPs I have discussed alternative options, risks & benefits,  which  include, but are not limited to, bleeding, infection, perforation,respiratory complication & drug reaction.  The patient agrees with this plan & written consent will be obtained.    Dr [name]  MD,MRCP [facility]) Follow up in 8 to 10 weeks

## 2020-11-07 NOTE — Patient Instructions (Signed)
Low-FODMAP Eating Plan  FODMAP stands for fermentable oligosaccharides, disaccharides, monosaccharides, and polyols. These are sugars that are hard for some people to digest. A low-FODMAP eating plan may help some people who have irritable bowel syndrome (IBS) and certain other bowel (intestinal) diseases to manage their symptoms. This meal plan can be complicated to follow. Work with a diet and nutrition specialist (dietitian) to make a low-FODMAP eating plan that is right for you. A dietitian can help make sure that you get enough nutrition from this diet. What are tips for following this plan? Reading food labels  Check labels for hidden FODMAPs such as: ? High-fructose syrup. ? Honey. ? Agave. ? Natural fruit flavors. ? Onion or garlic powder.  Choose low-FODMAP foods that contain 3-4 grams of fiber per serving.  Check food labels for serving sizes. Eat only one serving at a time to make sure FODMAP levels stay low. Shopping  Shop with a list of foods that are recommended on this diet and make a meal plan. Meal planning  Follow a low-FODMAP eating plan for up to 6 weeks, or as told by your health care provider or dietitian.  To follow the eating plan: 1. Eliminate high-FODMAP foods from your diet completely. Choose only low-FODMAP foods to eat. You will do this for 2-6 weeks. 2. Gradually reintroduce high-FODMAP foods into your diet one at a time. Most people should wait a few days before introducing the next new high-FODMAP food into their meal plan. Your dietitian can recommend how quickly you may reintroduce foods. 3. Keep a daily record of what and how much you eat and drink. Make note of any symptoms that you have after eating. 4. Review your daily record with a dietitian regularly to identify which foods you can eat and which foods you should avoid. General tips  Drink enough fluid each day to keep your urine pale yellow.  Avoid processed foods. These often have added sugar  and may be high in FODMAPs.  Avoid most dairy products, whole grains, and sweeteners.  Work with a dietitian to make sure you get enough fiber in your diet.  Avoid high FODMAP foods at meals to manage symptoms. Recommended foods Fruits Bananas, oranges, tangerines, lemons, limes, blueberries, raspberries, strawberries, grapes, cantaloupe, honeydew melon, kiwi, papaya, passion fruit, and pineapple. Limited amounts of dried cranberries, banana chips, and shredded coconut. Vegetables Eggplant, zucchini, cucumber, peppers, green beans, bean sprouts, lettuce, arugula, kale, Swiss chard, spinach, collard greens, bok choy, summer squash, potato, and tomato. Limited amounts of corn, carrot, and sweet potato. Green parts of scallions. Grains Gluten-free grains, such as rice, oats, buckwheat, quinoa, corn, polenta, and millet. Gluten-free pasta, bread, or cereal. Rice noodles. Corn tortillas. Meats and other proteins Unseasoned beef, pork, poultry, or fish. Eggs. Berniece Salines. Tofu (firm) and tempeh. Limited amounts of nuts and seeds, such as almonds, walnuts, [place] nuts, pecans, peanuts, nut butters, pumpkin seeds, chia seeds, and sunflower seeds. Dairy Lactose-free milk, yogurt, and kefir. Lactose-free cottage cheese and ice cream. Non-dairy milks, such as almond, coconut, hemp, and rice milk. Non-dairy yogurt. Limited amounts of goat cheese, brie, mozzarella, parmesan, swiss, and other hard cheeses. Fats and oils Butter-free spreads. Vegetable oils, such as olive, canola, and sunflower oil. Seasoning and other foods Artificial sweeteners with names that do not end in "ol," such as aspartame, saccharine, and stevia. Maple syrup, white table sugar, raw sugar, brown sugar, and molasses. Mayonnaise, soy sauce, and tamari. Fresh basil, coriander, parsley, rosemary, and thyme. Beverages Water and  mineral water. Sugar-sweetened soft drinks. Small amounts of orange juice or cranberry juice. Black and green tea.  Most dry wines. Coffee. The items listed above may not be a complete list of foods and beverages you can eat. Contact a dietitian for more information. Foods to avoid Fruits Fresh, dried, and juiced forms of apple, pear, watermelon, peach, plum, cherries, apricots, blackberries, boysenberries, figs, nectarines, and mango. Avocado. Vegetables Chicory root, artichoke, asparagus, cabbage, snow peas, Brussels sprouts, broccoli, sugar snap peas, mushrooms, celery, and cauliflower. Onions, garlic, leeks, and the white part of scallions. Grains Wheat, including kamut, durum, and semolina. Barley and bulgur. Couscous. Wheat-based cereals. Wheat noodles, bread, crackers, and pastries. Meats and other proteins Fried or fatty meat. Sausage. Cashews and pistachios. Soybeans, baked beans, black beans, chickpeas, kidney beans, fava beans, navy beans, lentils, black-eyed peas, and split peas. Dairy Milk, yogurt, ice cream, and soft cheese. Cream and sour cream. Milk-based sauces. Custard. Buttermilk. Soy milk. Seasoning and other foods Any sugar-free gum or candy. Foods that contain artificial sweeteners such as sorbitol, mannitol, isomalt, or xylitol. Foods that contain honey, high-fructose corn syrup, or agave. Bouillon, vegetable stock, beef stock, and chicken stock. Garlic and onion powder. Condiments made with onion, such as hummus, chutney, pickles, relish, salad dressing, and salsa. Tomato paste. Beverages Chicory-based drinks. Coffee substitutes. Chamomile tea. Fennel tea. Sweet or fortified wines such as port or sherry. Diet soft drinks made with isomalt, mannitol, maltitol, sorbitol, or xylitol. Apple, pear, and mango juice. Juices with high-fructose corn syrup. The items listed above may not be a complete list of foods and beverages you should avoid. Contact a dietitian for more information. Summary  FODMAP stands for fermentable oligosaccharides, disaccharides, monosaccharides, and polyols. These  are sugars that are hard for some people to digest.  A low-FODMAP eating plan is a short-term diet that helps to ease symptoms of certain bowel diseases.  The eating plan usually lasts up to 6 weeks. After that, high-FODMAP foods are reintroduced gradually and one at a time. This can help you find out which foods may be causing symptoms.  A low-FODMAP eating plan can be complicated. It is best to work with a dietitian who has experience with this type of plan. This information is not intended to replace advice given to you by your health care provider. Make sure you discuss any questions you have with your health care provider. Document Revised: 12/08/2019 Document Reviewed: 12/08/2019 Elsevier Patient Education  [place].

## 2020-11-12 ENCOUNTER — Other Ambulatory Visit: Payer: Self-pay

## 2020-11-12 ENCOUNTER — Encounter: Payer: Self-pay | Admitting: *Deleted

## 2020-11-12 ENCOUNTER — Ambulatory Visit (INDEPENDENT_AMBULATORY_CARE_PROVIDER_SITE_OTHER): Payer: Medicare Other | Admitting: Hospice and Palliative Medicine

## 2020-11-12 ENCOUNTER — Encounter: Payer: Self-pay | Admitting: Hospice and Palliative Medicine

## 2020-11-12 DIAGNOSIS — R0602 Shortness of breath: Secondary | ICD-10-CM | POA: Diagnosis not present

## 2020-11-12 DIAGNOSIS — I10 Essential (primary) hypertension: Secondary | ICD-10-CM | POA: Diagnosis not present

## 2020-11-12 DIAGNOSIS — R300 Dysuria: Secondary | ICD-10-CM

## 2020-11-12 DIAGNOSIS — Z0001 Encounter for general adult medical examination with abnormal findings: Secondary | ICD-10-CM | POA: Diagnosis not present

## 2020-11-12 DIAGNOSIS — R5383 Other fatigue: Secondary | ICD-10-CM | POA: Diagnosis not present

## 2020-11-12 DIAGNOSIS — G4733 Obstructive sleep apnea (adult) (pediatric): Secondary | ICD-10-CM

## 2020-11-12 MED ORDER — OLMESARTAN MEDOXOMIL 5 MG PO TABS: 10.0000 mg | ORAL_TABLET | Freq: Every day | ORAL | 0 refills | Status: DC

## 2020-11-12 NOTE — Progress Notes (Signed)
[facility] [place], [place] [address]  Internal MEDICINE  Office Visit Note  Patient Name: Whitney [name]  [id number]  [id number]  Date of Service: 11/13/2020  Chief Complaint  Patient presents with  . Medicare Wellness    Discuss surgery June 1st, pt needs medical clearance   . Hypertension  . Sleep Apnea  . Depression  . Anxiety  . Quality Metric Gaps    dexa     HPI Pt is here for routine health maintenance examination Having right knee replacement in June--will need clearance, we have not yet received paperwork from surgeon for clearance Has PAP and breast exams with GYN Has OSA- never went to have titration study, she would like to repeat PSG as she does not agree with the results Scheduled for colonoscopy in May Using her albuterol inhaler once or twice per month, breathing remains well controlled Will need BMD-wait until mammogram in November BP elevated today-denies headaches, chest pain or visual disturbances Followed by cardiology for HTN as well as PAF Followed by nephrology for CKD  Current Medication: Outpatient Encounter Medications as of 11/12/2020  Medication Sig  . [DISCONTINUED] olmesartan (BENICAR) 5 MG tablet Take 2 tablets (10 mg total) by mouth daily.  Marland Kitchen albuterol (VENTOLIN HFA) 108 (90 Base) MCG/ACT inhaler Inhale 2 puffs into the lungs every 4 (four) hours as needed for wheezing or shortness of breath.  Marland Kitchen amLODipine (NORVASC) 5 MG tablet Take 1 tablet (5 mg total) by mouth 2 (two) times daily.  Marland Kitchen aspirin EC 81 MG tablet Take 81 mg by mouth daily.   . carvedilol (COREG) 3.125 MG tablet TAKE 2 TABLETS(6.25 MG) BY MOUTH TWICE DAILY WITH A MEAL (Patient taking differently: Take 6.25 mg by mouth in the morning and at bedtime.)  . Cholecalciferol (VITAMIN D) 50 MCG (2000 UT) tablet Take 2,000 Units by mouth in the morning and at bedtime.   . diclofenac Sodium (VOLTAREN) 1 % GEL Apply 1 application topically 4 (four) times daily  as needed (pain).   Marland Kitchen ezetimibe (ZETIA) 10 MG tablet Take 1 tablet (10 mg total) by mouth daily.  . fluconazole (DIFLUCAN) 150 MG tablet Take 1 tab po every other day  . gabapentin (NEURONTIN) 100 MG capsule Take 100 mg by mouth 2 (two) times daily.  Marland Kitchen orlistat (ALLI) 60 MG capsule Take 60 mg by mouth 3 (three) times daily with meals.  Marland Kitchen OVER THE COUNTER MEDICATION Apply 1 application topically in the morning and at bedtime. Tummy cream  . rOPINIRole (REQUIP) 0.5 MG tablet Take 0.5-1 mg by mouth 2 (two) times daily as needed (pain).  . [DISCONTINUED] traMADol (ULTRAM) 50 MG tablet Take one tab po bid for pain (Patient not taking: No sig reported)   No facility-administered encounter medications on file as of 11/12/2020.    Surgical History: Past Surgical History:  Procedure Laterality Date  . BIOPSY  10/31/2019   Procedure: BIOPSY;  Surgeon: Rush Landmark Telford Nab., MD;  Location: [place];  Service: Gastroenterology;;  . CARDIAC CATHETERIZATION    . CHOLECYSTECTOMY    . COLONOSCOPY WITH PROPOFOL N/A 06/13/2019   Procedure: COLONOSCOPY WITH PROPOFOL;  Surgeon: [name], MD;  Location: [facility] ENDOSCOPY;  Service: Gastroenterology;  Laterality: N/A;  . COLONOSCOPY WITH PROPOFOL N/A 08/12/2019   Procedure: COLONOSCOPY WITH PROPOFOL;  Surgeon: [name], MD;  Location: [facility] ENDOSCOPY;  Service: Gastroenterology;  Laterality: N/A;  . COLONOSCOPY WITH PROPOFOL N/A 09/19/2019   Procedure: COLONOSCOPY WITH PROPOFOL;  Surgeon: Vicente Males,  Bailey Mech, MD;  Location: [place];  Service: Gastroenterology;  Laterality: N/A;  . DILATION AND CURETTAGE OF UTERUS    . ESOPHAGOGASTRODUODENOSCOPY (EGD) WITH PROPOFOL N/A 06/13/2019   Procedure: ESOPHAGOGASTRODUODENOSCOPY (EGD) WITH PROPOFOL;  Surgeon: [name], MD;  Location: [facility] ENDOSCOPY;  Service: Gastroenterology;  Laterality: N/A;  Pt will go for COVID test on 80-9-98 as she uses public transportation and will be in the area on that day.   .  ESOPHAGOGASTRODUODENOSCOPY (EGD) WITH PROPOFOL N/A 10/31/2019   Procedure: ESOPHAGOGASTRODUODENOSCOPY (EGD) WITH PROPOFOL;  Surgeon: Rush Landmark Telford Nab., MD;  Location: [place];  Service: Gastroenterology;  Laterality: N/A;  . HEMOSTASIS CLIP PLACEMENT  10/31/2019   Procedure: HEMOSTASIS CLIP PLACEMENT;  Surgeon: Irving Copas., MD;  Location: [place];  Service: Gastroenterology;;  . HYSTEROSCOPY WITH D & C N/A 02/16/2017   Procedure: DILATATION AND CURETTAGE Pollyann Glen;  Surgeon: Brayton Mars, MD;  Location: [facility];  Service: Gynecology;  Laterality: N/A;  . JOINT REPLACEMENT    . kidney removal Left   . KIDNEY SURGERY Left 1979   left kidney removed  . LEFT HEART CATH AND CORONARY ANGIOGRAPHY N/A 10/21/2017   Procedure: LEFT HEART CATH AND CORONARY ANGIOGRAPHY;  Surgeon: Teodoro Spray, MD;  Location: [place] CV LAB;  Service: Cardiovascular;  Laterality: N/A;  . POLYPECTOMY  10/31/2019   Procedure: POLYPECTOMY;  Surgeon: Rush Landmark Telford Nab., MD;  Location: [place];  Service: Gastroenterology;;  . TOTAL KNEE ARTHROPLASTY Left 07/06/2017   Procedure: TOTAL KNEE ARTHROPLASTY;  Surgeon: Lovell Sheehan, MD;  Location: [facility];  Service: Orthopedics;  Laterality: Left;  . UPPER ESOPHAGEAL ENDOSCOPIC ULTRASOUND (EUS) N/A 10/31/2019   Procedure: UPPER ESOPHAGEAL ENDOSCOPIC ULTRASOUND (EUS);  Surgeon: Irving Copas., MD;  Location: [place];  Service: Gastroenterology;  Laterality: N/A;  . WRIST ARTHROSCOPY Left 1970s  . XI ROBOTIC ASSISTED VENTRAL HERNIA N/A 12/01/2019   Procedure: XI ROBOTIC ASSISTED VENTRAL HERNIA;  Surgeon: Jules Husbands, MD;  Location: [facility];  Service: General;  Laterality: N/A;    Medical History: Past Medical History:  Diagnosis Date  . Abnormal Pap smear of cervix    Pt states she had colposcopy   . Anxiety   . Arthritis   . Chronic kidney disease    removed left kidney  . Depression   . Diverticulitis    . Dyspnea   . Dysrhythmia   . Hx of unilateral nephrectomy 1979   Left Nephrectomy  . Hypertension   . Lower extremity edema   . Palpitations   . PMB (postmenopausal bleeding)   . Restless leg syndrome   . Sleep apnea    has no cpap  . Vitamin D deficiency     Family History: Family History  Problem Relation Age of Onset  . Ovarian cancer Mother   . Hypertension Father   . Diabetes Father   . Cancer Father   . Breast cancer Sister   . Diabetes Sister       Review of Systems  Constitutional: Negative for chills, diaphoresis and fatigue.  HENT: Negative for ear pain, postnasal drip and sinus pressure.   Eyes: Negative for photophobia, discharge, redness, itching and visual disturbance.  Respiratory: Negative for cough, shortness of breath and wheezing.   Cardiovascular: Negative for chest pain, palpitations and leg swelling.  Gastrointestinal: Negative for abdominal pain, constipation, diarrhea, nausea and vomiting.  Genitourinary: Negative for dysuria and flank pain.  Musculoskeletal: Negative for arthralgias, back pain, gait problem and neck pain.  Skin: Negative for color change.  Allergic/Immunologic: Negative for environmental allergies and food allergies.  Neurological: Negative for dizziness and headaches.  Hematological: Does not bruise/bleed easily.  Psychiatric/Behavioral: Negative for agitation, behavioral problems (depression) and hallucinations.     Vital Signs: BP (!) 164/100   Pulse 73   Temp (!) 97.2 F (36.2 C)   Resp 16   Wt 246 lb 9.6 oz (111.9 kg)   LMP 01/26/2017 Comment: age 13  SpO2 98%   BMI 41.04 kg/m    Physical Exam Vitals reviewed.  Constitutional:      Appearance: Normal appearance. She is obese.  Cardiovascular:     Rate and Rhythm: Normal rate and regular rhythm.     Pulses: Normal pulses.     Heart sounds: Normal heart sounds.  Pulmonary:     Effort: Pulmonary effort is normal.     Breath sounds: Normal breath sounds.   Abdominal:     General: Abdomen is flat.     Palpations: Abdomen is soft.  Musculoskeletal:        General: Normal range of motion.     Cervical back: Normal range of motion.  Skin:    General: Skin is warm.  Neurological:     General: No focal deficit present.     Mental Status: She is alert and oriented to person, place, and time. Mental status is at baseline.  Psychiatric:        Mood and Affect: Mood normal.        Behavior: Behavior normal.        Thought Content: Thought content normal.        Judgment: Judgment normal.      LABS: No results found for this or any previous visit (from the past 2160 hour(s)).   Assessment/Plan: 1. Encounter for routine adult health examination with abnormal findings Well appearing 70 year old [place] female Breast exam deferred Will order mammogram as well as BMD when due for mammogram in November Scheduled for colonoscopy in May - CBC w/Diff/Platelet - Comprehensive Metabolic Panel (CMET) - Lipid Panel With LDL/HDL Ratio - TSH + free T4  2. Essential hypertension Olmesartan added initially but discontinued next day after reviewing chart with history of angioedema with ACE inhibitors Will start low dose hydralazine TID and titrate as indicated Reviewed updated echocardiogram for underlying cardiac etiology - ECHOCARDIOGRAM COMPLETE; Future  3. Morbid obesity ([place]) Comorbid conditions include HTN as well as OSA Obesity Counseling: Risk Assessment: An assessment of behavioral risk factors was made today and includes lack of exercise sedentary lifestyle, lack of portion control and poor dietary habits.  Risk Modification Advice: She was counseled on portion control guidelines. Restricting daily caloric intake to 1800. The detrimental long term effects of obesity on her health and ongoing poor compliance was also discussed with the patient. - ECHOCARDIOGRAM COMPLETE; Future  4. OSA (obstructive sleep apnea) AHI 8.6 in 2019--did not go  through with having titration study or being set up with CPAP--will need repeat testing Echocardiogram to review degree of diastolic dysfunction in the setting of untreated OSA - ECHOCARDIOGRAM COMPLETE; Future  5. Shortness of breath Pre-procedural echocardiogram - ECHOCARDIOGRAM COMPLETE; Future  6. Other fatigue - CBC w/Diff/Platelet - Comprehensive Metabolic Panel (CMET) - Lipid Panel With LDL/HDL Ratio - TSH + free T4  7. Dysuria - UA/M w/rflx Culture, Routine  General Counseling: Therma verbalizes understanding of the findings of todays visit and agrees with plan of treatment. I have discussed any further diagnostic  evaluation that may be needed or ordered today. We also reviewed her medications today. she has been encouraged to call the office with any questions or concerns that should arise related to todays visit.    Counseling: Hypertension Counseling:   The following hypertensive lifestyle modification were recommended and discussed:  1. Limiting alcohol intake to less than 1 oz/day of ethanol:(24 oz of beer or 8 oz of wine or 2 oz of 100-proof whiskey). 2. Take baby ASA 81 mg daily. 3. Importance of regular aerobic exercise and losing weight. 4. Reduce dietary saturated fat and cholesterol intake for overall cardiovascular health. 5. Maintaining adequate dietary potassium, calcium, and magnesium intake. 6. Regular monitoring of the blood pressure. 7. Reduce sodium intake to less than 100 mmol/day (less than 2.3 gm of sodium or less than 6 gm of sodium choride)    Orders Placed This Encounter  Procedures  . UA/M w/rflx Culture, Routine  . CBC w/Diff/Platelet  . Comprehensive Metabolic Panel (CMET)  . Lipid Panel With LDL/HDL Ratio  . TSH + free T4  . ECHOCARDIOGRAM COMPLETE    Meds ordered this encounter  Medications  . DISCONTD: olmesartan (BENICAR) 5 MG tablet    Sig: Take 2 tablets (10 mg total) by mouth daily.    Dispense:  180 tablet    Refill:  0     Total time spent: 30 Minutes  Time spent includes review of chart, medications, test results, and follow up plan with the patient.   This patient was seen by Theodoro Grist AGNP-C Collaboration with Dr Lavera Guise as a part of collaborative care agreement   Tanna Furry. [facility] Internal Medicine

## 2020-11-13 ENCOUNTER — Other Ambulatory Visit: Payer: Self-pay | Admitting: Hospice and Palliative Medicine

## 2020-11-13 ENCOUNTER — Ambulatory Visit: Payer: Medicare Other

## 2020-11-13 ENCOUNTER — Telehealth: Payer: Self-pay

## 2020-11-13 ENCOUNTER — Encounter: Payer: Self-pay | Admitting: Hospice and Palliative Medicine

## 2020-11-13 VITALS — BP 170/90

## 2020-11-13 DIAGNOSIS — M25661 Stiffness of right knee, not elsewhere classified: Secondary | ICD-10-CM | POA: Diagnosis not present

## 2020-11-13 DIAGNOSIS — G8929 Other chronic pain: Secondary | ICD-10-CM

## 2020-11-13 DIAGNOSIS — M25552 Pain in left hip: Secondary | ICD-10-CM

## 2020-11-13 DIAGNOSIS — M6281 Muscle weakness (generalized): Secondary | ICD-10-CM

## 2020-11-13 DIAGNOSIS — M25561 Pain in right knee: Secondary | ICD-10-CM | POA: Diagnosis not present

## 2020-11-13 MED ORDER — HYDRALAZINE HCL 10 MG PO TABS: ORAL_TABLET | ORAL | 0 refills | Status: DC

## 2020-11-13 NOTE — Therapy (Signed)
[place] [facility] [address], [place], [address] Phone: [phone]   Fax:  [phone]  Physical Therapy Treatment/Discharge Summary  Patient Details  Name: Whitney [name] MRN: [id number] Date of Birth: 07-Aug-1950 Referring Provider (PT): Kurtis Bushman MD   Encounter Date: 11/13/2020   PT End of Session - 11/13/20 1356    Visit Number 17    Number of Visits 21    Date for PT Re-Evaluation 11/29/20    Authorization Type [employer] MCR    Authorization Time Period 07/10/20-09/04/20; 09/06/20-11/29/20    PT Start Time 1303    PT Stop Time 1343    PT Time Calculation (min) 40 min    Activity Tolerance Patient tolerated treatment well;No increased pain;Patient limited by pain    Behavior During Therapy [facility] for tasks assessed/performed           Past Medical History:  Diagnosis Date  . Abnormal Pap smear of cervix    Pt states she had colposcopy   . Anxiety   . Arthritis   . Chronic kidney disease    removed left kidney  . Depression   . Diverticulitis   . Dyspnea   . Dysrhythmia   . Hx of unilateral nephrectomy 1979   Left Nephrectomy  . Hypertension   . Lower extremity edema   . Palpitations   . PMB (postmenopausal bleeding)   . Restless leg syndrome   . Sleep apnea    has no cpap  . Vitamin D deficiency     Past Surgical History:  Procedure Laterality Date  . BIOPSY  10/31/2019   Procedure: BIOPSY;  Surgeon: Rush Landmark Telford Nab., MD;  Location: [place];  Service: Gastroenterology;;  . CARDIAC CATHETERIZATION    . CHOLECYSTECTOMY    . COLONOSCOPY WITH PROPOFOL N/A 06/13/2019   Procedure: COLONOSCOPY WITH PROPOFOL;  Surgeon: [name], MD;  Location: [facility] ENDOSCOPY;  Service: Gastroenterology;  Laterality: N/A;  . COLONOSCOPY WITH PROPOFOL N/A 08/12/2019   Procedure: COLONOSCOPY WITH PROPOFOL;  Surgeon: [name], MD;  Location: [facility] ENDOSCOPY;  Service: Gastroenterology;  Laterality: N/A;  .  COLONOSCOPY WITH PROPOFOL N/A 09/19/2019   Procedure: COLONOSCOPY WITH PROPOFOL;  Surgeon: [name], MD;  Location: [facility]OSCOPY;  Service: Gastroenterology;  Laterality: N/A;  . DILATION AND CURETTAGE OF UTERUS    . ESOPHAGOGASTRODUODENOSCOPY (EGD) WITH PROPOFOL N/A 06/13/2019   Procedure: ESOPHAGOGASTRODUODENOSCOPY (EGD) WITH PROPOFOL;  Surgeon: [name], MD;  Location: [facility] ENDOSCOPY;  Service: Gastroenterology;  Laterality: N/A;  Pt will go for COVID test on 27-7-41 as she uses public transportation and will be in the area on that day.   . ESOPHAGOGASTRODUODENOSCOPY (EGD) WITH PROPOFOL N/A 10/31/2019   Procedure: ESOPHAGOGASTRODUODENOSCOPY (EGD) WITH PROPOFOL;  Surgeon: Rush Landmark Telford Nab., MD;  Location: [place];  Service: Gastroenterology;  Laterality: N/A;  . HEMOSTASIS CLIP PLACEMENT  10/31/2019   Procedure: HEMOSTASIS CLIP PLACEMENT;  Surgeon: Irving Copas., MD;  Location: [place];  Service: Gastroenterology;;  . HYSTEROSCOPY WITH D & C N/A 02/16/2017   Procedure: DILATATION AND CURETTAGE Pollyann Glen;  Surgeon: Brayton Mars, MD;  Location: [facility];  Service: Gynecology;  Laterality: N/A;  . JOINT REPLACEMENT    . kidney removal Left   . KIDNEY SURGERY Left 1979   left kidney removed  . LEFT HEART CATH AND CORONARY ANGIOGRAPHY N/A 10/21/2017   Procedure: LEFT HEART CATH AND CORONARY ANGIOGRAPHY;  Surgeon: Teodoro Spray, MD;  Location: [place] CV LAB;  Service: Cardiovascular;  Laterality: N/A;  . POLYPECTOMY  10/31/2019   Procedure: POLYPECTOMY;  Surgeon: Rush Landmark Telford Nab., MD;  Location: [place];  Service: Gastroenterology;;  . TOTAL KNEE ARTHROPLASTY Left 07/06/2017   Procedure: TOTAL KNEE ARTHROPLASTY;  Surgeon: Lovell Sheehan, MD;  Location: [facility];  Service: Orthopedics;  Laterality: Left;  . UPPER ESOPHAGEAL ENDOSCOPIC ULTRASOUND (EUS) N/A 10/31/2019   Procedure: UPPER ESOPHAGEAL ENDOSCOPIC ULTRASOUND (EUS);  Surgeon:  Irving Copas., MD;  Location: [place];  Service: Gastroenterology;  Laterality: N/A;  . WRIST ARTHROSCOPY Left 1970s  . XI ROBOTIC ASSISTED VENTRAL HERNIA N/A 12/01/2019   Procedure: XI ROBOTIC ASSISTED VENTRAL HERNIA;  Surgeon: Jules Husbands, MD;  Location: [facility];  Service: General;  Laterality: N/A;    Vitals:   11/13/20 1315  BP: (!) 170/90     Subjective Assessment - 11/13/20 1315    Subjective Pt saw PCP yesterday, waiting for new BP meds to help with chronically elevated BP.    Pertinent History s/p Left TKA 07/06/2017; right knee arthritis with weakness as well with reports of right and left leg "giving way" intermittently; Going to have TKA 6/1    How long can you sit comfortably? no limit, just stiff upon rising after prolonged sitting.    How long can you stand comfortably? 2-3 hours most of time; unchanged    How long can you walk comfortably? 2-3 hours; unchanged    Currently in Pain? Yes    Pain Score 4     Pain Orientation Right    Pain Radiating Towards Right hemi head, ram's horn distrubtion              [facility] PT Assessment - 11/13/20 0001      Assessment   Medical Diagnosis Rt Knee DJD    Referring Provider (PT) Kurtis Bushman MD    Hand Dominance Right    Prior Therapy Yes      Precautions   Precautions Fall      Observation/Other Assessments   Focus on Therapeutic Outcomes (FOTO)  56/100            Assessment:  5xSTS: hands free in 10.6sec 6MWT: 1724ft c SPC in RUE;  SPC sequenced with LLE in a 2-point gait pattern, SPC appears too long with fatigue related scapular elevation in loading after 3 minutes significant Left Trendelenburg on Left to end range, moderate amplitude, high velocity, appears made more exaggerated by longer limb on left; mild trendelenburg on the right hip is lower in amplitude and velocity, not obviously to end range of joint;  Pt has Rt sciatic pain from buttocks to distal thigh in finial 60 seconds of test  (suspect related to left hip/pelvis mechanics in gait) Pt also reports hip pain on right. Pt has no knee pain during or at end of test.  Rt knee PROM: 7-110 degrees       PT Short Term Goals - 11/13/20 1323      PT SHORT TERM GOAL #1   Title Patient will demonstrate independence with HEP to maximize rehab potential.    Time 3    Period Weeks    Status Achieved    Target Date 04/25/20             PT Long Term Goals - 11/13/20 1323      PT LONG TERM GOAL #1   Title Patient will demonstrate independence with progressive HEP to maintain progress made during therapy.    Baseline  needs updates to inlcude knee ext ROM; 09/24/2020: dependent    Time 6    Period Weeks    Status Achieved    Target Date 08/07/20      PT LONG TERM GOAL #2   Title Patient will improve FOTO score to 58 to show an improvement in patients ability to perform functional activities.    Baseline 50 at Eval; 05/23/20: 55; 53 on 12/7; 09/24/2020: 49; on 11/13/20: 56/100    Time 8    Period Weeks    Status Achieved    Target Date 09/04/20      PT LONG TERM GOAL #3   Title Patient will have a worst pain of 3/10 to indicate significant improvement with pain and ability to perform functional activities more functionally.     Baseline 10/10; 05/23/20: 5/10; 09/24/2020: 8/10; 4/12 (typically yes)    Time 8    Period Weeks    Status On-going    Target Date 09/04/20      PT LONG TERM GOAL #4   Title After 8 weeks pt will demonstrate tolerance of 6MWT c distance >1034ft c LRAD.    Baseline using SPC, intermittent stumbles; 09/24/2020; 1153ft; 11/13/20: 1775ft    Time 8    Period Weeks    Status Achieved    Target Date 11/29/20                 Plan - 11/13/20 1357    Clinical Impression Statement Pt reassessed this date in preparation for DC. HEP is current, pt is compliant, denies any need for update. Pt is pleased with progress and would like to DC until the postoperative phase. Reassessment this date  reveals dramatic improvement in ROM, strength, 6MWT, activity tolerance in general. Pt will be DC from our services after today.    Personal Factors and Comorbidities Age;Comorbidity 2    Comorbidities HBP, Obesity    Examination-Activity Limitations Stairs;Locomotion Level;Stand    Examination-Participation Restrictions Shop    Stability/Clinical Decision Making Evolving/Moderate complexity    Clinical Decision Making Moderate    Rehab Potential Good    Clinical Impairments Affecting Rehab Potential (+) motivated, chronic condition (-) arthritis right knee with pain    PT Frequency 2x / week    PT Duration 6 weeks    PT Treatment/Interventions Electrical Stimulation;Cryotherapy;Moist Heat;Gait training;Stair training;Therapeutic activities;Therapeutic exercise;Balance training;Patient/family education;Neuromuscular re-education;Manual techniques;Passive range of motion;Dry needling;Spinal Manipulations;Joint Manipulations;Functional mobility training;Traction    PT Next Visit Plan Monitor BP throughout session and continue POC; obtain a scribpt for graded compression RLE thigh high    PT Home Exercise Plan Heel Raises, standing hip abduction    Consulted and Agree with Plan of Care Patient           Patient will benefit from skilled therapeutic intervention in order to improve the following deficits and impairments:  Pain,Decreased activity tolerance,Decreased endurance,Decreased range of motion,Decreased strength,Impaired perceived functional ability,Difficulty walking,Decreased mobility,Obesity  Visit Diagnosis: Muscle weakness (generalized)  Stiffness of right knee, not elsewhere classified  Chronic pain of right knee  Pain in left hip     Problem List Patient Active Problem List   Diagnosis Date Noted  . Intestinal metaplasia of gastric cardia 11/07/2020  . Mild intermittent asthma without complication 30/86/5784  . Shortness of breath 08/08/2020  . Vaginal candidiasis  08/08/2020  . Atherosclerosis of aorta ([place]) 07/09/2020  . Acute diverticulitis 05/13/2020  . Fever blister 10/15/2019  . Cerumen in auditory canal on examination 10/15/2019  . Bilateral  hearing loss 10/15/2019  . Need for vaccination against Streptococcus pneumoniae using pneumococcal conjugate vaccine 7 10/15/2019  . Encounter for screening mammogram for malignant neoplasm of breast 10/15/2019  . Colon cancer screening 10/15/2019  . Elevated erythrocyte sedimentation rate 11/02/2018  . Lumbar spondylosis 11/02/2018  . PVC's (premature ventricular contractions) 10/18/2018  . Primary osteoarthritis of right knee 09/15/2018  . Encounter for pre-operative examination 09/15/2018  . Obstructive sleep apnea 09/15/2018  . Calculus of gallbladder without cholecystitis without obstruction 09/15/2018  . Calculus of gallbladder with cholecystitis without biliary obstruction 01/27/2018  . Restless leg syndrome 01/27/2018  . Generalized abdominal pain 01/11/2018  . Cyst of pancreas 01/11/2018  . Other specified disorders of kidney and ureter 01/11/2018  . Primary insomnia 01/11/2018  . Paroxysmal atrial fibrillation ([place]) 11/12/2017  . Essential hypertension 10/25/2017  . Mixed hyperlipidemia 10/25/2017  . Low back pain at multiple sites 10/25/2017  . Dysuria 10/25/2017  . Encounter for general adult medical examination with abnormal findings 10/25/2017  . Vitamin D deficiency 10/25/2017  . Near syncope   . Symptomatic bradycardia 10/19/2017  . Carpal tunnel syndrome 10/12/2017  . Primary osteoarthritis of left knee 07/09/2017  . History of total knee arthroplasty 07/06/2017  . Chest pain 06/29/2017  . Postop check 02/25/2017  . Impingement syndrome of shoulder region 01/20/2017  . Neck pain 01/20/2017  . Obesity (BMI 35.0-39.9 without comorbidity) 07/15/2016  . Endometrial polyp 07/15/2016  . Morbid obesity ([place]) 07/15/2016  . Family history of breast cancer in first degree relative  07/15/2016  . Family history of ovarian cancer 07/15/2016  . Knee pain 07/04/2016  . Bilateral leg pain 06/24/2016   2:17 PM, 11/13/20 Etta Grandchild, PT, DPT Physical Therapist - [place] [phone] (Office)   Floyde Dingley C 11/13/2020, 2:04 PM  [place] [facility] [address], [place], [address] Phone: [phone]   Fax:  [phone]  Name: HOPELYNN GARTLAND MRN: [id number] Date of Birth: Jun 01, 1951

## 2020-11-13 NOTE — Telephone Encounter (Signed)
Pt advised that we change med to hydralazine and reschedule her appt in 3 weeks to see taylor and advised to do labs before her appt

## 2020-11-15 LAB — UA/M W/RFLX CULTURE, ROUTINE

## 2020-11-20 ENCOUNTER — Telehealth: Payer: Self-pay

## 2020-11-20 NOTE — Telephone Encounter (Signed)
I would need to see her in the office to examine her to determine antibiotics

## 2020-11-20 NOTE — Telephone Encounter (Signed)
Patient is scheduled and has confirmed appt for 11/22/20 @1 :45pm.

## 2020-11-20 NOTE — Telephone Encounter (Signed)
Patient left a voicemail in need of relief from a Diverticulitis flare-up. Says she experienced stomach pain all weekend. Would like to know if Dr. Vicente Males could prescribe an antibiotic? Please call patient.

## 2020-11-22 ENCOUNTER — Encounter: Payer: Self-pay | Admitting: Gastroenterology

## 2020-11-22 ENCOUNTER — Other Ambulatory Visit: Payer: Self-pay

## 2020-11-22 ENCOUNTER — Other Ambulatory Visit: Payer: Self-pay | Admitting: Gastroenterology

## 2020-11-22 ENCOUNTER — Ambulatory Visit
Admission: RE | Admit: 2020-11-22 | Discharge: 2020-11-22 | Disposition: A | Discharge status: Home / Self Care | Payer: Medicare Other | Source: Ambulatory Visit | Attending: Gastroenterology | Admitting: Gastroenterology

## 2020-11-22 ENCOUNTER — Ambulatory Visit (INDEPENDENT_AMBULATORY_CARE_PROVIDER_SITE_OTHER): Payer: Medicare Other | Admitting: Gastroenterology

## 2020-11-22 VITALS — BP 169/99 | HR 72 | Ht 65.0 in | Wt 245.4 lb

## 2020-11-22 DIAGNOSIS — R109 Unspecified abdominal pain: Secondary | ICD-10-CM | POA: Diagnosis not present

## 2020-11-22 DIAGNOSIS — K5792 Diverticulitis of intestine, part unspecified, without perforation or abscess without bleeding: Secondary | ICD-10-CM

## 2020-11-22 LAB — POCT I-STAT CREATININE: Creatinine, Ser: 1.2 mg/dL — ABNORMAL HIGH (ref 0.44–1.00)

## 2020-11-22 MED ORDER — METRONIDAZOLE 250 MG PO TABS: 250.0000 mg | ORAL_TABLET | Freq: Three times a day (TID) | ORAL | 0 refills | Status: AC

## 2020-11-22 MED ORDER — IOHEXOL 300 MG/ML  SOLN
100.0000 mL | Freq: Once | INTRAMUSCULAR | Status: AC | PRN
  Administered 2020-11-22: 100 mL via INTRAVENOUS

## 2020-11-22 MED ORDER — CIPROFLOXACIN HCL 500 MG PO TABS: 500.0000 mg | ORAL_TABLET | Freq: Two times a day (BID) | ORAL | 0 refills | Status: AC

## 2020-11-22 NOTE — Progress Notes (Signed)
[name] MD, MRCP(U.K) [address]  [place]  [place], [place] [address]  Main: [phone]  Fax: [phone]   Primary Care Physician: Luiz Ochoa, NP  Primary Gastroenterologist:  Dr. [name]   Urgent visit for abdominal discomfort  HPI: Whitney [name] is a 70 y.o. female    Summary of history :  Initially seen and referred on 3/31/2020for abdominal pain.Started after cholecystectomy  Abdominal CT scan in March 2019showed A small low-attenuation area within the body of the pancreas.   MRI of the abdomen in May 2019 which demonstrated benign liver abnormalities including multiple hemangiomas and cysts. Gallstone.'s multiple small cystic foci throughout the pancreas. Findings may represent small pseudocysts or areas of sidebranch duct ectasia. Cystic neoplasm of the pancreas not excluded follow-up imaging in 24 months recommended.  10/19/2018 HIDA scan: Normal. 02/18/2019: H pylori breath test - negative 03/04/2019: MRCP- progression of the gall bladder polyp which is 10 mm in size . Pancreas lesion is stable repeat in 1 year. 06/13/2019 colonoscopy:Large quantity of stool seen in the rectum and sigmoid repeat colonoscopy recommended.  EGD. Medium size hiatal hernia seen nodule seen just past the GE junction. Biopsies of the stomach demonstrated H. pylori gastritis.  Underwent laparoscopic cholecystectomy with Dr. Bethanie Dicker November 2020which resolved her longstanding abdominal pain  05/27/2019: CT scan of the abdomen and pelvis showed moderate supraumbilical midline ventral hernia containing inflamed flat and small amount of fluid. Stable small pancreatic cysts.   09/19/2019: Colonoscopy: Poor prep and 3 polyps 4 to 6 mm were resected in the ascending colon with a cold snare. Could not be retrieved since prep was poor  10/31/2019: EUS: Nodule at the GE junction was resectedit was benign. Biopsies of stomach showed chronic gastritis  with focal intestinal metaplasia. Cystic lesions of the pancreas were noted suggestive of IPMN.   12/01/2019: Ventral hernia repair. 02/15/2020: H. pylori breath test: Negative. 05/14/2020: Descending colon sigmoid colon diverticulitis   Interval history 06/04/2020-11/07/2020  She called our office 2 days back with an urgent visit with left lower quadrant pain, similar to the episode she had some years back when she had diverticulitis and required IV antibiotics.  She denies any fever, no diarrhea left lower quadrant pain cramping in nature nonradiating.   Current Outpatient Medications  Medication Sig Dispense Refill  . amLODipine (NORVASC) 5 MG tablet Take 1 tablet (5 mg total) by mouth 2 (two) times daily. 60 tablet 2  . aspirin EC 81 MG tablet Take 81 mg by mouth daily.     . carvedilol (COREG) 3.125 MG tablet TAKE 2 TABLETS(6.25 MG) BY MOUTH TWICE DAILY WITH A MEAL (Patient taking differently: Take 6.25 mg by mouth in the morning and at bedtime.) 120 tablet 3  . Cholecalciferol (VITAMIN D) 50 MCG (2000 UT) tablet Take 2,000 Units by mouth in the morning and at bedtime.     . ciprofloxacin (CIPRO) 500 MG tablet Take 1 tablet (500 mg total) by mouth 2 (two) times daily for 14 days. 20 tablet 0  . diclofenac Sodium (VOLTAREN) 1 % GEL Apply 1 application topically 4 (four) times daily as needed (pain).     Marland Kitchen ezetimibe (ZETIA) 10 MG tablet Take 1 tablet (10 mg total) by mouth daily. 90 tablet 3  . fluconazole (DIFLUCAN) 150 MG tablet Take 1 tab po every other day 3 tablet 0  . gabapentin (NEURONTIN) 100 MG capsule Take 100 mg by mouth 2 (two) times daily.    . hydrALAZINE (  APRESOLINE) 10 MG tablet Take one tablet by mouth three times daily for 1 week. Increase to 2 tablets by mouth three times daily on week 2. 90 tablet 0  . metroNIDAZOLE (FLAGYL) 250 MG tablet Take 1 tablet (250 mg total) by mouth 3 (three) times daily for 14 days. 42 tablet 0  . orlistat (ALLI) 60 MG capsule Take 60  mg by mouth 3 (three) times daily with meals.    Marland Kitchen rOPINIRole (REQUIP) 0.5 MG tablet Take 0.5-1 mg by mouth 2 (two) times daily as needed (pain).    Marland Kitchen albuterol (VENTOLIN HFA) 108 (90 Base) MCG/ACT inhaler Inhale 2 puffs into the lungs every 4 (four) hours as needed for wheezing or shortness of breath. 1 each 3  . OVER THE COUNTER MEDICATION Apply 1 application topically in the morning and at bedtime. Tummy cream     No current facility-administered medications for this visit.    Allergies as of 11/22/2020 - Review Complete 11/22/2020  Allergen Reaction Noted  . Enalapril Swelling   . Lisinopril Swelling 05/26/2016  . Ace inhibitors Swelling 11/13/2020    ROS:  General: Negative for anorexia, weight loss, fever, chills, fatigue, weakness. ENT: Negative for hoarseness, difficulty swallowing , nasal congestion. CV: Negative for chest pain, angina, palpitations, dyspnea on exertion, peripheral edema.  Respiratory: Negative for dyspnea at rest, dyspnea on exertion, cough, sputum, wheezing.  GI: See history of present illness. GU:  Negative for dysuria, hematuria, urinary incontinence, urinary frequency, nocturnal urination.  Endo: Negative for unusual weight change.    Physical Examination:   BP (!) 169/99 (BP Location: Left Arm, Patient Position: Sitting, Cuff Size: Large)   Pulse 72   Ht 5\' 5"  (1.651 m)   Wt 245 lb 6.4 oz (111.3 kg)   LMP 01/26/2017 Comment: age 75  BMI 40.84 kg/m   General: Well-nourished, well-developed in no acute distress.  Eyes: No icterus. Conjunctivae pink. Abdomen: Bowel sounds are normal, left lower quadrant with mild to moderate tenderness nondistended, no hepatosplenomegaly or masses, no abdominal bruits or hernia , no rebound or guarding.   Extremities: No lower extremity edema. No clubbing or deformities. Neuro: Alert and oriented x 3.  Grossly intact. Skin: Warm and dry, no jaundice.   Psych: Alert and cooperative, normal mood and  affect.   Imaging Studies: No results found.  Assessment and Plan:   Whitney [name] is a 70 y.o. y/o female  here today for an urgent visit for acute onset left lower quadrant pain of 2 days duration.  It feels very similar to her episode of diverticulitis she had some years back.  Denies any fever.  No guarding or rigidity.  Plan :  1. Commence on ciprofloxacin and Flagyl for empirical treatment of diverticulitis 2.  CT scan of the abdomen pelvis 3.  I have advised her clearly if the pain got worse she has to come in to the emergency room to get admitted for IV antibiotics and evaluation    Dr [name]  MD,MRCP [facility]) Follow up in Monday telephone visit to check how she is doing

## 2020-11-22 NOTE — Addendum Note (Signed)
Addended by: Storm Frisk on: 11/22/2020 02:26 PM   Modules accepted: Orders

## 2020-11-23 ENCOUNTER — Telehealth: Payer: Self-pay

## 2020-11-23 NOTE — Telephone Encounter (Signed)
-----   Message from [name], MD sent at 11/23/2020  8:10 AM EDT ----- Whitney [name]   Inform patient that she does have diverticulitis. Continue antibiotics prescribed yesterday , if she gets worse come to the hospital ER. Will speak with her on Monday to check how she is doing   C/c Kenton Kingfisher, Tanna Furry, NP   Dr [name] MD,MRCP [facility]) Gastroenterology/Hepatology Pager: [phone]

## 2020-11-23 NOTE — Telephone Encounter (Signed)
Informed patient of Dr. Georgeann Oppenheim comments/recommendations. Pt states she is feeling better since taking the antibiotics. She is following a soft food and liquid diet currently. Pt verbalized understanding.

## 2020-11-26 ENCOUNTER — Telehealth: Payer: Self-pay | Admitting: Gastroenterology

## 2020-11-26 ENCOUNTER — Telehealth (INDEPENDENT_AMBULATORY_CARE_PROVIDER_SITE_OTHER): Payer: Medicare Other | Admitting: Gastroenterology

## 2020-11-26 DIAGNOSIS — K5792 Diverticulitis of intestine, part unspecified, without perforation or abscess without bleeding: Secondary | ICD-10-CM | POA: Diagnosis not present

## 2020-11-26 NOTE — Telephone Encounter (Signed)
Patient also stated she is having headaches.  Patient wants to know if headaches are related to medication.  Patient taking Tylenol for headaches.

## 2020-11-26 NOTE — Progress Notes (Signed)
[place] , MD [address]  [place]  [place], [place] [address]  Main: [phone]  Fax: [phone]   Primary Care Physician: Luiz Ochoa, NP  Virtual Visit via Telephone Note  I connected with patient on 11/26/20 at  2:15 PM EDT could not get in touch with her had to leave a message and finally got in touch with her on 11/27/2020 at 2:56 PM.  By telephone and verified that I am speaking with the correct person using two identifiers.   I discussed the limitations, risks, security and privacy concerns of performing an evaluation and management service by telephone and the availability of in person appointments. I also discussed with the patient that there may be a patient responsible charge related to this service. The patient expressed understanding and agreed to proceed.  Location of Patient: Home Location of Provider: Home Persons involved: Patient and provider only   History of Present Illness:   Acute diverticulitis follow-up  HPI: TORII ROYSE is a 70 y.o. female was seen in my office on 11/22/2020 with a few days history of left lower quadrant pain which seem to be similar to her episodes of prior diverticulitis.  CT scan of the abdomen confirmed the same affecting the descending and sigmoid colon.  No perforation or abscess is noted.  Commenced on ciprofloxacin and Flagyl on the same day.  She was scheduled for colonoscopy for colon cancer screening which had to be rescheduled.  Since starting the antibiotic she is feeling 80% better.  She has some nausea probably related to colitis.  In the past he took her dosage with the wrong frequency but has changed it appropriately to taking years ciprofloxacin 1 tablet twice a day and Flagyl 3 times daily.  Current Outpatient Medications  Medication Sig Dispense Refill  . albuterol (VENTOLIN HFA) 108 (90 Base) MCG/ACT inhaler Inhale 2 puffs into the lungs every 4 (four) hours as needed for wheezing or shortness of  breath. 1 each 3  . amLODipine (NORVASC) 5 MG tablet Take 1 tablet (5 mg total) by mouth 2 (two) times daily. 60 tablet 2  . aspirin EC 81 MG tablet Take 81 mg by mouth daily.     . carvedilol (COREG) 3.125 MG tablet TAKE 2 TABLETS(6.25 MG) BY MOUTH TWICE DAILY WITH A MEAL (Patient taking differently: Take 6.25 mg by mouth in the morning and at bedtime.) 120 tablet 3  . Cholecalciferol (VITAMIN D) 50 MCG (2000 UT) tablet Take 2,000 Units by mouth in the morning and at bedtime.     . ciprofloxacin (CIPRO) 500 MG tablet Take 1 tablet (500 mg total) by mouth 2 (two) times daily for 14 days. 20 tablet 0  . diclofenac Sodium (VOLTAREN) 1 % GEL Apply 1 application topically 4 (four) times daily as needed (pain).     Marland Kitchen ezetimibe (ZETIA) 10 MG tablet Take 1 tablet (10 mg total) by mouth daily. 90 tablet 3  . fluconazole (DIFLUCAN) 150 MG tablet Take 1 tab po every other day 3 tablet 0  . gabapentin (NEURONTIN) 100 MG capsule Take 100 mg by mouth 2 (two) times daily.    . hydrALAZINE (APRESOLINE) 10 MG tablet Take one tablet by mouth three times daily for 1 week. Increase to 2 tablets by mouth three times daily on week 2. 90 tablet 0  . metroNIDAZOLE (FLAGYL) 250 MG tablet Take 1 tablet (250 mg total) by mouth 3 (three) times daily for 14 days. [place]  tablet 0  . orlistat (ALLI) 60 MG capsule Take 60 mg by mouth 3 (three) times daily with meals.    Marland Kitchen OVER THE COUNTER MEDICATION Apply 1 application topically in the morning and at bedtime. Tummy cream    . rOPINIRole (REQUIP) 0.5 MG tablet Take 0.5-1 mg by mouth 2 (two) times daily as needed (pain).     No current facility-administered medications for this visit.    Allergies as of 11/26/2020 - Review Complete 11/22/2020  Allergen Reaction Noted  . Enalapril Swelling   . Lisinopril Swelling 05/26/2016  . Ace inhibitors Swelling 11/13/2020    Review of Systems:    All systems reviewed and negative except where noted in HPI.    Observations/Objective:  Labs: CMP     Component Value Date/Time   NA 140 06/26/2020 2248   NA 142 11/18/2019 1005   K 4.1 06/26/2020 2248   CL 105 06/26/2020 2248   CO2 26 06/26/2020 2248   GLUCOSE 105 (H) 06/26/2020 2248   BUN 16 06/26/2020 2248   BUN 18 11/18/2019 1005   CREATININE 1.20 (H) 11/22/2020 1543   CALCIUM 8.4 (L) 06/26/2020 2248   PROT 6.8 06/26/2020 2248   PROT 6.8 11/18/2019 1005   ALBUMIN 3.3 (L) 06/26/2020 2248   ALBUMIN 3.8 11/18/2019 1005   AST 15 06/26/2020 2248   ALT 12 06/26/2020 2248   ALKPHOS 69 06/26/2020 2248   BILITOT 0.6 06/26/2020 2248   BILITOT 0.3 11/18/2019 1005   GFRNONAA 53 (L) 06/26/2020 2248   GFRAA 54 (L) 11/18/2019 1005   Lab Results  Component Value Date   WBC 6.3 06/26/2020   HGB 11.4 (L) 06/26/2020   HCT 36.1 06/26/2020   MCV 92.8 06/26/2020   PLT 270 06/26/2020    Imaging Studies: CT ABDOMEN PELVIS W CONTRAST  Result Date: 11/22/2020 CLINICAL DATA:  70 year old female with left lower quadrant abdominal pain. EXAM: CT ABDOMEN AND PELVIS WITH CONTRAST TECHNIQUE: Multidetector CT imaging of the abdomen and pelvis was performed using the standard protocol following bolus administration of intravenous contrast. CONTRAST:  180mL OMNIPAQUE IOHEXOL 300 MG/ML  SOLN COMPARISON:  CT abdomen pelvis dated 05/25/2020. FINDINGS: Lower chest: The visualized lung bases are clear. There is mild cardiomegaly. Coronary vascular calcification. No intra-abdominal free air or free fluid. Hepatobiliary: There is a 3 cm cyst in the dome of the liver. Faint additional subcentimeter hepatic hypodense lesions are too small to characterize. No intrahepatic biliary ductal dilatation. Cholecystectomy. No retained calcified stone noted in the central CBD. Pancreas: Indeterminate 1 cm hypodense lesion in the distal body of the pancreas similar to prior CT dating back to 2019. No active inflammatory changes. No dilatation of the main pancreatic duct or gland  atrophy. Spleen: Normal in size without focal abnormality. Adrenals/Urinary Tract: The adrenal glands unremarkable. There is a solitary right kidney. There is no hydronephrosis or nephrolithiasis. Renal vascular calcifications noted. The right ureter appears unremarkable. The urinary bladder is collapsed. There is trabeculated appearance of the bladder wall which may be related to chronic bladder dysfunction. Stomach/Bowel: There is sigmoid diverticulosis. There is inflammatory changes of diverticula in the distal descending/sigmoid junction consistent with acute diverticulitis. No diverticular abscess or perforation. There is no bowel obstruction. The appendix is normal. Vascular/Lymphatic: Advanced aortoiliac atherosclerotic disease. The IVC is unremarkable. No portal venous gas. There is no adenopathy. Reproductive: The uterus is anteverted and grossly unremarkable. No adnexal masses. Other: Midline vertical anterior abdominal wall incisional scar. Small fat containing ventral supraumbilical hernia.  Musculoskeletal: Osteopenia with degenerative changes of the spine. No acute osseous pathology. IMPRESSION: 1. Acute diverticulitis of the distal descending/sigmoid junction. No diverticular abscess or perforation. 2. Solitary right kidney. No hydronephrosis or nephrolithiasis. 3. Aortic Atherosclerosis (ICD10-I70.0). Electronically Signed   By: Anner Crete M.D.   On: 11/22/2020 16:17    Assessment and Plan:   NAYELIS BONITO is a 70 y.o. y/o female whom I am following up for acute diverticulitis of the descending and sigmoid colon seen on CAT scan on 11/22/2020.  Commenced on antibiotics on the same day at office visit.  Plan :   Complete course of antibiotics.  Expect resolution.  If pain worsens or symptoms worsen she needs to give [place] a call.  I will give her a call in 3 weeks time to check she is doing well and if doing well we will proceed with colonoscopy in 5 to 6 weeks after that.    I discussed  the assessment and treatment plan with the patient. The patient was provided an opportunity to ask questions and all were answered. The patient agreed with the plan and demonstrated an understanding of the instructions.   The patient was advised to call back or seek an in-person evaluation if the symptoms worsen or if the condition fails to improve as anticipated.  I provided 15 minutes of non-face-to-face time during this encounter.  Dr [name] MD,MRCP [facility]) Gastroenterology/Hepatology Pager: [phone]   Speech recognition software was used to dictate this note.

## 2020-11-26 NOTE — Telephone Encounter (Signed)
Patient called back, she could not get MyChart uploaded.  Patient reports medication is working, she has questions about the antibiotic that was prescribed.  Patient asks for call back to discuss.

## 2020-11-27 ENCOUNTER — Encounter: Payer: Self-pay | Admitting: Gastroenterology

## 2020-11-27 NOTE — Telephone Encounter (Signed)
Patient said she missed a phone call from the nurse or doctor. Would like a return call.

## 2020-11-29 ENCOUNTER — Telehealth: Payer: Self-pay

## 2020-11-29 NOTE — Telephone Encounter (Signed)
Patient was scheduled for 12/24/20

## 2020-12-03 ENCOUNTER — Ambulatory Visit: Admit: 2020-12-03 | Payer: Medicare Other | Admitting: Gastroenterology

## 2020-12-03 ENCOUNTER — Other Ambulatory Visit: Payer: Self-pay

## 2020-12-03 ENCOUNTER — Ambulatory Visit: Payer: Medicare Other | Admitting: Hospice and Palliative Medicine

## 2020-12-03 SURGERY — COLONOSCOPY WITH PROPOFOL
Anesthesia: General

## 2020-12-05 ENCOUNTER — Encounter: Payer: Self-pay | Admitting: Orthopedic Surgery

## 2020-12-05 ENCOUNTER — Other Ambulatory Visit: Payer: Self-pay | Admitting: Orthopedic Surgery

## 2020-12-05 NOTE — H&P (Signed)
Whitney [name] MRN:  [id number] DOB/SEX:  05-01-1951/female  CHIEF COMPLAINT:  Painful right Knee  HISTORY: Patient is a 70 y.o. female presented with a history of pain in the right knee. Onset of symptoms was gradual starting several years ago with gradually worsening course since that time. Prior procedures on the knee include none. Patient has been treated conservatively with over-the-counter NSAIDs and activity modification. Patient currently rates pain in the knee at 10 out of 10 with activity. There is pain at night.  PAST MEDICAL HISTORY: Patient Active Problem List   Diagnosis Date Noted  . Intestinal metaplasia of gastric cardia 11/07/2020  . Mild intermittent asthma without complication 38/18/2993  . Shortness of breath 08/08/2020  . Vaginal candidiasis 08/08/2020  . Atherosclerosis of aorta ([place]) 07/09/2020  . Acute diverticulitis 05/13/2020  . Fever blister 10/15/2019  . Cerumen in auditory canal on examination 10/15/2019  . Bilateral hearing loss 10/15/2019  . Need for vaccination against Streptococcus pneumoniae using pneumococcal conjugate vaccine 7 10/15/2019  . Encounter for screening mammogram for malignant neoplasm of breast 10/15/2019  . Colon cancer screening 10/15/2019  . Elevated erythrocyte sedimentation rate 11/02/2018  . Lumbar spondylosis 11/02/2018  . PVC's (premature ventricular contractions) 10/18/2018  . Primary osteoarthritis of right knee 09/15/2018  . Encounter for pre-operative examination 09/15/2018  . Obstructive sleep apnea 09/15/2018  . Calculus of gallbladder without cholecystitis without obstruction 09/15/2018  . Calculus of gallbladder with cholecystitis without biliary obstruction 01/27/2018  . Restless leg syndrome 01/27/2018  . Generalized abdominal pain 01/11/2018  . Cyst of pancreas 01/11/2018  . Other specified disorders of kidney and ureter 01/11/2018  . Primary insomnia 01/11/2018  . Paroxysmal atrial fibrillation ([place]) 11/12/2017   . Essential hypertension 10/25/2017  . Mixed hyperlipidemia 10/25/2017  . Low back pain at multiple sites 10/25/2017  . Dysuria 10/25/2017  . Encounter for general adult medical examination with abnormal findings 10/25/2017  . Vitamin D deficiency 10/25/2017  . Near syncope   . Symptomatic bradycardia 10/19/2017  . Carpal tunnel syndrome 10/12/2017  . Primary osteoarthritis of left knee 07/09/2017  . History of total knee arthroplasty 07/06/2017  . Chest pain 06/29/2017  . Postop check 02/25/2017  . Impingement syndrome of shoulder region 01/20/2017  . Neck pain 01/20/2017  . Obesity (BMI 35.0-39.9 without comorbidity) 07/15/2016  . Endometrial polyp 07/15/2016  . Morbid obesity ([place]) 07/15/2016  . Family history of breast cancer in first degree relative 07/15/2016  . Family history of ovarian cancer 07/15/2016  . Knee pain 07/04/2016  . Bilateral leg pain 06/24/2016   Past Medical History:  Diagnosis Date  . Abnormal Pap smear of cervix    Pt states she had colposcopy   . Anxiety   . Arthritis   . Chronic kidney disease    removed left kidney  . Depression   . Diverticulitis   . Dyspnea   . Dysrhythmia   . Hx of unilateral nephrectomy 1979   Left Nephrectomy  . Hypertension   . Lower extremity edema   . Palpitations   . PMB (postmenopausal bleeding)   . Restless leg syndrome   . Sleep apnea    has no cpap  . Vitamin D deficiency    Past Surgical History:  Procedure Laterality Date  . BIOPSY  10/31/2019   Procedure: BIOPSY;  Surgeon: Rush Landmark Telford Nab., MD;  Location: [place];  Service: Gastroenterology;;  . CARDIAC CATHETERIZATION    . CHOLECYSTECTOMY    . COLONOSCOPY WITH PROPOFOL N/A 06/13/2019  Procedure: COLONOSCOPY WITH PROPOFOL;  Surgeon: [name], MD;  Location[facility] END[facility]COPY;  Service: Gastroenterology;  Laterality: N/A;  . COLONOSCOPY WITH PROPOFOL N/A 08/12/2019   Procedure: COLONOSCOPY WITH PROPOFOL;  Surgeon: [name], MD;  Location:  [facility] ENDOSCOPY;  Service: Gastroenterology;  Laterality: N/A;  . COLONOSCOPY WITH PROPOFOL N/A 09/19/2019   Procedure: COLONOSCOPY WITH PROPOFOL;  Surgeon: [name], MD;  Location: [facility] ENDOSCOPY;  Service: Gastroenterology;  Laterality: N/A;  . DILATION AND CURETTAGE OF UTERUS    . ESOPHAGOGASTRODUODENOSCOPY (EGD) WITH PROPOFOL N/A 06/13/2019   Procedure: ESOPHAGOGASTRODUODENOSCOPY (EGD) WITH PROPOFOL;  Surgeon: [name], MD;  Location: [facility] ENDOSCOPY;  Service: Gastroenterology;  Laterality: N/A;  Pt will go for COVID test on 92-1-19 as she uses public transportation and will be in the area on that day.   . ESOPHAGOGASTRODUODENOSCOPY (EGD) WITH PROPOFOL N/A 10/31/2019   Procedure: ESOPHAGOGASTRODUODENOSCOPY (EGD) WITH PROPOFOL;  Surgeon: Rush Landmark Telford Nab., MD;  Location: [place];  Service: Gastroenterology;  Laterality: N/A;  . HEMOSTASIS CLIP PLACEMENT  10/31/2019   Procedure: HEMOSTASIS CLIP PLACEMENT;  Surgeon: Irving Copas., MD;  Location: [place];  Service: Gastroenterology;;  . HYSTEROSCOPY WITH D & C N/A 02/16/2017   Procedure: DILATATION AND CURETTAGE [place];  Surgeon: Brayton Mars, MD;  Location: [facility];  Service: Gynecology;  Laterality: N/A;  . JOINT REPLACEMENT    . kidney removal Left   . KIDNEY SURGERY Left 1979   left kidney removed  . LEFT HEART CATH AND CORONARY ANGIOGRAPHY N/A 10/21/2017   Procedure: LEFT HEART CATH AND CORONARY ANGIOGRAPHY;  Surgeon: Teodoro Spray, MD;  Location: [place] CV LAB;  Service: Cardiovascular;  Laterality: N/A;  . POLYPECTOMY  10/31/2019   Procedure: POLYPECTOMY;  Surgeon: Rush Landmark Telford Nab., MD;  Location: [place];  Service: Gastroenterology;;  . TOTAL KNEE ARTHROPLASTY Left 07/06/2017   Procedure: TOTAL KNEE ARTHROPLASTY;  Surgeon: Lovell Sheehan, MD;  Location: [facility];  Service: Orthopedics;  Laterality: Left;  . UPPER ESOPHAGEAL ENDOSCOPIC ULTRASOUND (EUS) N/A 10/31/2019    Procedure: UPPER ESOPHAGEAL ENDOSCOPIC ULTRASOUND (EUS);  Surgeon: Irving Copas., MD;  Location: [place];  Service: Gastroenterology;  Laterality: N/A;  . WRIST ARTHROSCOPY Left 1970s  . XI ROBOTIC ASSISTED VENTRAL HERNIA N/A 12/01/2019   Procedure: XI ROBOTIC ASSISTED VENTRAL HERNIA;  Surgeon: Jules Husbands, MD;  Location: [facility];  Service: General;  Laterality: N/A;     MEDICATIONS:  (Not in a hospital admission)   ALLERGIES:   Allergies  Allergen Reactions  . Enalapril Swelling  . Lisinopril Swelling  . Ace Inhibitors Swelling    And ARB    REVIEW OF SYSTEMS:  Pertinent items are noted in HPI.   FAMILY HISTORY:   Family History  Problem Relation Age of Onset  . Ovarian cancer Mother   . Hypertension Father   . Diabetes Father   . Cancer Father   . Breast cancer Sister   . Diabetes Sister     SOCIAL HISTORY:   Social History   Tobacco Use  . Smoking status: Never Smoker  . Smokeless tobacco: Never Used  Substance Use Topics  . Alcohol use: Yes    Alcohol/week: 2.0 standard drinks    Types: 2 Standard drinks or equivalent per week    Comment: occasionally     EXAMINATION:  Vital signs in last 24 hours: @VSRANGES @  General appearance: alert, cooperative and no distress Neck: no JVD and supple, symmetrical, trachea midline Lungs: clear to auscultation bilaterally  Heart: regular rate and rhythm, S1, S2 normal, no murmur, click, rub or gallop Abdomen: soft, non-tender; bowel sounds normal; no masses,  no organomegaly Pelvic: N/A Pulses: 2+ and symmetric Skin: Skin color, texture, turgor normal. No rashes or lesions Neurologic: Alert and oriented X 3, normal strength and tone. Normal symmetric reflexes. Normal coordination and gait  Musculoskeletal:  ROM 0-100, Ligaments intact,  Imaging Review Plain radiographs demonstrate severe degenerative joint disease of the right knee. The overall alignment is significant varus. The bone quality  appears to be good for age and reported activity level.  Assessment/Plan: Primary osteoarthritis, right knee   The patient history, physical examination and imaging studies are consistent with advanced degenerative joint disease of the right knee. The patient has failed conservative treatment.  The clearance notes were reviewed.  After discussion with the patient it was felt that Total Knee Replacement was indicated. The procedure,  risks, and benefits of total knee arthroplasty were presented and reviewed. The risks including but not limited to aseptic loosening, infection, blood clots, vascular injury, stiffness, patella tracking problems complications among others were discussed. The patient acknowledged the explanation, agreed to proceed with the plan.  Carlynn Spry 12/05/2020, 1:31 PM

## 2020-12-11 DIAGNOSIS — I700 Atherosclerosis of aorta: Secondary | ICD-10-CM | POA: Diagnosis not present

## 2020-12-11 DIAGNOSIS — I10 Essential (primary) hypertension: Secondary | ICD-10-CM | POA: Diagnosis not present

## 2020-12-11 DIAGNOSIS — N1832 Chronic kidney disease, stage 3b: Secondary | ICD-10-CM | POA: Diagnosis not present

## 2020-12-11 DIAGNOSIS — I493 Ventricular premature depolarization: Secondary | ICD-10-CM | POA: Diagnosis not present

## 2020-12-17 ENCOUNTER — Ambulatory Visit (INDEPENDENT_AMBULATORY_CARE_PROVIDER_SITE_OTHER): Payer: Medicare Other | Admitting: Internal Medicine

## 2020-12-17 ENCOUNTER — Other Ambulatory Visit: Payer: Self-pay

## 2020-12-17 ENCOUNTER — Encounter: Payer: Self-pay | Admitting: Internal Medicine

## 2020-12-17 VITALS — BP 185/96 | HR 82 | Temp 97.1°F | Resp 16 | Ht 65.0 in | Wt 245.5 lb

## 2020-12-17 DIAGNOSIS — Z6841 Body Mass Index (BMI) 40.0 and over, adult: Secondary | ICD-10-CM

## 2020-12-17 DIAGNOSIS — I700 Atherosclerosis of aorta: Secondary | ICD-10-CM

## 2020-12-17 DIAGNOSIS — I10 Essential (primary) hypertension: Secondary | ICD-10-CM | POA: Diagnosis not present

## 2020-12-17 DIAGNOSIS — E782 Mixed hyperlipidemia: Secondary | ICD-10-CM

## 2020-12-17 MED ORDER — CARVEDILOL 6.25 MG PO TABS: 6.2500 mg | ORAL_TABLET | Freq: Two times a day (BID) | ORAL | 3 refills | Status: DC

## 2020-12-17 MED ORDER — HYDRALAZINE HCL 25 MG PO TABS: 25.0000 mg | ORAL_TABLET | Freq: Three times a day (TID) | ORAL | 3 refills | Status: DC

## 2020-12-17 NOTE — Progress Notes (Signed)
[facility] [address] [place], [place] [address]  Internal MEDICINE  Office Visit Note  Patient Name: Whitney [name]  [id number]  [id number]  Date of Service: 12/20/2020  Chief Complaint  Patient presents with  . Follow-up    Surgical clearance   . Anxiety  . Depression  . Sleep Apnea  . Hypertension  . Quality Metric Gaps    dexa    HPI  Patient is here for routine follow-up and also surgical evaluation and clearance for possible knee surgery.  Patient is supposed to have a right total knee arthroplasty and she is post to have a spinal anesthesia. Patient is high risk due to her BMI and her uncontrolled hypertension, patient was started on hydralazine 10 mg 3 times a day titrated to 20 mg 3 times a day blood pressure is under better control she is also taking amlodipine 5 mg once a day her carvedilol was also increased to 6.25 twice a day'. Patient has history of diverticulitis.  Her BMI is 40 Patient also has abnormal lipid profile from 11/18/2019 Patient also has radiological finding of calcified coronary arteries and aortic atherosclerosis on his CT angio [place] 06/27/2020 Current Medication: Outpatient Encounter Medications as of 12/17/2020  Medication Sig Note  . carvedilol (COREG) 6.25 MG tablet Take 1 tablet (6.25 mg total) by mouth 2 (two) times daily with a meal. 12/17/2020: Have not picked up yet  . hydrALAZINE (APRESOLINE) 25 MG tablet Take 1 tablet (25 mg total) by mouth 3 (three) times daily. For HTN   . albuterol (VENTOLIN HFA) 108 (90 Base) MCG/ACT inhaler Inhale 2 puffs into the lungs every 4 (four) hours as needed for wheezing or shortness of breath.   Marland Kitchen amLODipine (NORVASC) 5 MG tablet Take 1 tablet (5 mg total) by mouth 2 (two) times daily.   . Cholecalciferol (VITAMIN D) 50 MCG (2000 UT) tablet Take 2,000 Units by mouth in the morning and at bedtime.    Marland Kitchen ezetimibe (ZETIA) 10 MG tablet Take 1 tablet (10 mg total) by mouth daily. 12/17/2020: Take  sometimes  . orlistat (ALLI) 60 MG capsule Take 60 mg by mouth 3 (three) times daily with meals.   Marland Kitchen rOPINIRole (REQUIP) 0.5 MG tablet Take 0.5-1 mg by mouth 2 (two) times daily as needed (pain).   . [DISCONTINUED] aspirin EC 81 MG tablet Take 81 mg by mouth daily.    . [DISCONTINUED] carvedilol (COREG) 3.125 MG tablet TAKE 2 TABLETS(6.25 MG) BY MOUTH TWICE DAILY WITH A MEAL (Patient taking differently: Take 6.25 mg by mouth in the morning and at bedtime.)   . [DISCONTINUED] diclofenac Sodium (VOLTAREN) 1 % GEL Apply 1 application topically 4 (four) times daily as needed (pain).    . [DISCONTINUED] fluconazole (DIFLUCAN) 150 MG tablet Take 1 tab po every other day (Patient not taking: Reported on 12/17/2020)   . [DISCONTINUED] gabapentin (NEURONTIN) 100 MG capsule Take 100 mg by mouth 2 (two) times daily.   . [DISCONTINUED] hydrALAZINE (APRESOLINE) 10 MG tablet Take one tablet by mouth three times daily for 1 week. Increase to 2 tablets by mouth three times daily on week 2.   . [DISCONTINUED] OVER THE COUNTER MEDICATION Apply 1 application topically in the morning and at bedtime. Tummy cream    No facility-administered encounter medications on file as of 12/17/2020.    Surgical History: Past Surgical History:  Procedure Laterality Date  . BIOPSY  10/31/2019   Procedure: BIOPSY;  Surgeon: Meridee Score Netty Starring., MD;  Location: [facility] ENDOSCOPY;  Service: Gastroenterology;;  . CARDIAC CATHETERIZATION    . CHOLECYSTECTOMY    . COLONOSCOPY WITH PROPOFOL N/A 06/13/2019   Procedure: COLONOSCOPY WITH PROPOFOL;  Surgeon: [name], MD;  Location: [facility] ENDOSCOPY;  Service: Gastroenterology;  Laterality: N/A;  . COLONOSCOPY WITH PROPOFOL N/A 08/12/2019   Procedure: COLONOSCOPY WITH PROPOFOL;  Surgeon: [name], MD;  Location: [facility] ENDOSCOPY;  Service: Gastroenterology;  Laterality: N/A;  . COLONOSCOPY WITH PROPOFOL N/A 09/19/2019   Procedure: COLONOSCOPY WITH PROPOFOL;  Surgeon: [name], MD;   Location: [facility] ENDOSCOPY;  Service: Gastroenterology;  Laterality: N/A;  . DILATION AND CURETTAGE OF UTERUS    . ESOPHAGOGASTRODUODENOSCOPY (EGD) WITH PROPOFOL N/A 06/13/2019   Procedure: ESOPHAGOGASTRODUODENOSCOPY (EGD) WITH PROPOFOL;  Surgeon: [name], MD;  Location: [facility] ENDOSCOPY;  Service: Gastroenterology;  Laterality: N/A;  Pt will go for COVID test on 30-1-60 as she uses public transportation and will be in the area on that day.   . ESOPHAGOGASTRODUODENOSCOPY (EGD) WITH PROPOFOL N/A 10/31/2019   Procedure: ESOPHAGOGASTRODUODENOSCOPY (EGD) WITH PROPOFOL;  Surgeon: Rush Landmark Telford Nab., MD;  Location: [place];  Service: Gastroenterology;  Laterality: N/A;  . HEMOSTASIS CLIP PLACEMENT  10/31/2019   Procedure: HEMOSTASIS CLIP PLACEMENT;  Surgeon: Irving Copas., MD;  Location: [place];  Service: Gastroenterology;;  . HYSTEROSCOPY WITH D & C N/A 02/16/2017   Procedure: DILATATION AND CURETTAGE [place];  Surgeon: Brayton Mars, MD;  Location: [facility];  Service: Gynecology;  Laterality: N/A;  . JOINT REPLACEMENT    . kidney removal Left   . KIDNEY SURGERY Left 1979   left kidney removed  . LEFT HEART CATH AND CORONARY ANGIOGRAPHY N/A 10/21/2017   Procedure: LEFT HEART CATH AND CORONARY ANGIOGRAPHY;  Surgeon: Teodoro Spray, MD;  Location: [place] CV LAB;  Service: Cardiovascular;  Laterality: N/A;  . POLYPECTOMY  10/31/2019   Procedure: POLYPECTOMY;  Surgeon: Rush Landmark Telford Nab., MD;  Location: [place];  Service: Gastroenterology;;  . TOTAL KNEE ARTHROPLASTY Left 07/06/2017   Procedure: TOTAL KNEE ARTHROPLASTY;  Surgeon: Lovell Sheehan, MD;  Location: [facility];  Service: Orthopedics;  Laterality: Left;  . UPPER ESOPHAGEAL ENDOSCOPIC ULTRASOUND (EUS) N/A 10/31/2019   Procedure: UPPER ESOPHAGEAL ENDOSCOPIC ULTRASOUND (EUS);  Surgeon: Irving Copas., MD;  Location: [place];  Service: Gastroenterology;  Laterality: N/A;  . WRIST  ARTHROSCOPY Left 1970s  . XI ROBOTIC ASSISTED VENTRAL HERNIA N/A 12/01/2019   Procedure: XI ROBOTIC ASSISTED VENTRAL HERNIA;  Surgeon: Jules Husbands, MD;  Location: [facility];  Service: General;  Laterality: N/A;    Medical History: Past Medical History:  Diagnosis Date  . Abnormal Pap smear of cervix    Pt states she had colposcopy   . Anxiety   . Arthritis   . Chronic kidney disease    removed left kidney  . Depression   . Diverticulitis   . Dyspnea   . Dysrhythmia   . Hx of unilateral nephrectomy 1979   Left Nephrectomy  . Hypertension   . Lower extremity edema   . Palpitations   . PMB (postmenopausal bleeding)   . Restless leg syndrome   . Sleep apnea    has no cpap  . Vitamin D deficiency     Family History: Family History  Problem Relation Age of Onset  . Ovarian cancer Mother   . Hypertension Father   . Diabetes Father   . Cancer Father   . Breast cancer Sister   . Diabetes Sister  Social History   Socioeconomic History  . Marital status: Divorced    Spouse name: Not on file  . Number of children: Not on file  . Years of education: Not on file  . Highest education level: Not on file  Occupational History  . Not on file  Tobacco Use  . Smoking status: Never Smoker  . Smokeless tobacco: Never Used  Vaping Use  . Vaping Use: Never used  Substance and Sexual Activity  . Alcohol use: Yes    Alcohol/week: 2.0 standard drinks    Types: 2 Standard drinks or equivalent per week    Comment: occasionally  . Drug use: Yes    Frequency: 3.0 times per week    Types: Marijuana    Comment: 07/09/20 Pt states she uses for leg pain: marijuana; states no longer uses cocaine  . Sexual activity: Yes    Birth control/protection: None  Other Topics Concern  . Not on file  Social History Narrative   Lives with cousin.   Social Determinants of Health   Financial Resource Strain: Not on file  Food Insecurity: Not on file  Transportation Needs: Not on file   Physical Activity: Not on file  Stress: Not on file  Social Connections: Not on file  Intimate Partner Violence: Not on file      Review of Systems  Constitutional: Negative for chills, diaphoresis and fatigue.  HENT: Negative for ear pain, postnasal drip and sinus pressure.   Eyes: Negative for photophobia, discharge, redness, itching and visual disturbance.  Respiratory: Negative for cough, shortness of breath and wheezing.   Cardiovascular: Negative for chest pain, palpitations and leg swelling.  Gastrointestinal: Negative for abdominal pain, constipation, diarrhea, nausea and vomiting.  Genitourinary: Negative for dysuria and flank pain.  Musculoskeletal: Negative for arthralgias, back pain, gait problem and neck pain.  Skin: Negative for color change.  Allergic/Immunologic: Negative for environmental allergies and food allergies.  Neurological: Negative for dizziness and headaches.  Hematological: Does not bruise/bleed easily.  Psychiatric/Behavioral: Negative for agitation, behavioral problems (depression) and hallucinations.    Vital Signs: BP (!) 185/96   Pulse 82   Temp (!) 97.1 F (36.2 C)   Resp 16   Ht 5\' 5"  (1.651 m)   Wt 245 lb 8 oz (111.4 kg)   LMP 01/26/2017 Comment: age 89  SpO2 98%   BMI 40.85 kg/m    Physical Exam Constitutional:      General: She is not in acute distress.    Appearance: She is well-developed. She is not diaphoretic.  HENT:     Head: Normocephalic and atraumatic.     Mouth/Throat:     Pharynx: No oropharyngeal exudate.  Eyes:     Pupils: Pupils are equal, round, and reactive to light.  Neck:     Thyroid: No thyromegaly.     Vascular: No JVD.     Trachea: No tracheal deviation.  Cardiovascular:     Rate and Rhythm: Normal rate and regular rhythm.     Heart sounds: Normal heart sounds. No murmur heard. No friction rub. No gallop.   Pulmonary:     Effort: Pulmonary effort is normal. No respiratory distress.     Breath  sounds: No wheezing or rales.  Chest:     Chest wall: No tenderness.  Abdominal:     General: Bowel sounds are normal.     Palpations: Abdomen is soft.  Musculoskeletal:        General: Normal range of motion.  Cervical back: Normal range of motion and neck supple.  Lymphadenopathy:     Cervical: No cervical adenopathy.  Skin:    General: Skin is warm and dry.  Neurological:     Mental Status: She is alert and oriented to person, place, and time.     Cranial Nerves: No cranial nerve deficit.  Psychiatric:        Behavior: Behavior normal.        Thought Content: Thought content normal.        Judgment: Judgment normal.        Assessment/Plan: 1. Uncontrolled hypertension Patient continues to have uncontrolled hypertension however it is improving.  She will get a new prescription for hydralazine 25 3 times daily increase Norvasc 5 mg twice daily and continue carvedilol 6.25 mg all new prescriptions were given to the patient today patient is supposed to bring all her medications on next visit so she can be advised properly  2. BMI 40.0-44.9, adult ([place]) Her BMI is elevated she is a high risk for TKA, patient is working hard to lose weight and she promises that her BMI will be below 40 once she is starting to have her surgery  3. Aortic atherosclerosis ([place]) Neurological finding on CT scan, she will be started on a statin she has tried intolerance in the past.  We will try Crestor 5 mg twice a week on next visit  4. Mixed hyperlipidemia Patient is a current lipid profile since she is working on her diet and will advise her after lipid profile is updated  General Counseling: Cendy verbalizes understanding of the findings of todays visit and agrees with plan of treatment. I have discussed any further diagnostic evaluation that may be needed or ordered today. We also reviewed her medications today. she has been encouraged to call the office with any questions or concerns that  should arise related to todays visit.    No orders of the defined types were placed in this encounter.   Meds ordered this encounter  Medications  . hydrALAZINE (APRESOLINE) 25 MG tablet    Sig: Take 1 tablet (25 mg total) by mouth 3 (three) times daily. For HTN    Dispense:  90 tablet    Refill:  3  . carvedilol (COREG) 6.25 MG tablet    Sig: Take 1 tablet (6.25 mg total) by mouth 2 (two) times daily with a meal.    Dispense:  60 tablet    Refill:  3    Total time spent:45 Minutes Time spent includes review of chart, medications, test results, and follow up plan with the patient.   [place] Controlled Substance Database was reviewed by me.   Dr Lavera Guise Internal medicine

## 2020-12-21 ENCOUNTER — Encounter
Admission: RE | Admit: 2020-12-21 | Discharge: 2020-12-21 | Disposition: A | Discharge status: Home / Self Care | Payer: Medicare Other | Source: Ambulatory Visit | Attending: Orthopedic Surgery | Admitting: Orthopedic Surgery

## 2020-12-21 ENCOUNTER — Other Ambulatory Visit: Payer: Medicare Other

## 2020-12-21 ENCOUNTER — Other Ambulatory Visit: Payer: Self-pay

## 2020-12-21 DIAGNOSIS — Z01812 Encounter for preprocedural laboratory examination: Secondary | ICD-10-CM | POA: Diagnosis not present

## 2020-12-21 DIAGNOSIS — Z8614 Personal history of Methicillin resistant Staphylococcus aureus infection: Secondary | ICD-10-CM

## 2020-12-21 HISTORY — DX: Personal history of Methicillin resistant Staphylococcus aureus infection: Z86.14

## 2020-12-21 LAB — URINALYSIS, ROUTINE W REFLEX MICROSCOPIC
Bacteria, UA: NONE SEEN
Bilirubin Urine: NEGATIVE
Glucose, UA: NEGATIVE mg/dL
Hgb urine dipstick: NEGATIVE
Ketones, ur: NEGATIVE mg/dL
Leukocytes,Ua: NEGATIVE
Nitrite: NEGATIVE
Protein, ur: >=300 mg/dL — AB
Specific Gravity, Urine: 1.014 (ref 1.005–1.030)
pH: 6 (ref 5.0–8.0)

## 2020-12-21 LAB — BASIC METABOLIC PANEL
Anion gap: 6 (ref 5–15)
BUN: 15 mg/dL (ref 8–23)
CO2: 26 mmol/L (ref 22–32)
Calcium: 8 mg/dL — ABNORMAL LOW (ref 8.9–10.3)
Chloride: 102 mmol/L (ref 98–111)
Creatinine, Ser: 1.16 mg/dL — ABNORMAL HIGH (ref 0.44–1.00)
GFR, Estimated: 51 mL/min — ABNORMAL LOW (ref >60)
Glucose, Bld: 92 mg/dL (ref 70–99)
Potassium: 4.3 mmol/L (ref 3.5–5.1)
Sodium: 134 mmol/L — ABNORMAL LOW (ref 135–145)

## 2020-12-21 LAB — CBC
HCT: 41.3 % (ref 36.0–46.0)
Hemoglobin: 13.3 g/dL (ref 12.0–15.0)
MCH: 29.5 pg (ref 26.0–34.0)
MCHC: 32.2 g/dL (ref 30.0–36.0)
MCV: 91.6 fL (ref 80.0–100.0)
Platelets: 174 10*3/uL (ref 150–400)
RBC: 4.51 MIL/uL (ref 3.87–5.11)
RDW: 13.1 % (ref 11.5–15.5)
WBC: 3.9 10*3/uL — ABNORMAL LOW (ref 4.0–10.5)
nRBC: 0 % (ref 0.0–0.2)

## 2020-12-21 LAB — PROTIME-INR
INR: 1 (ref 0.8–1.2)
Prothrombin Time: 13.4 seconds (ref 11.4–15.2)

## 2020-12-21 LAB — SURGICAL PCR SCREEN
MRSA, PCR: POSITIVE — AB
Staphylococcus aureus: POSITIVE — AB

## 2020-12-21 LAB — TYPE AND SCREEN
ABO/RH(D): B POS
Antibody Screen: NEGATIVE

## 2020-12-21 LAB — APTT: aPTT: 28 seconds (ref 24–36)

## 2020-12-21 NOTE — Progress Notes (Signed)
  Perioperative Services  Abnormal Lab Notification  Date: 12/21/20  Name: Whitney [name] MRN:   [id number]  Re: Abnormal labs noted during PAT appointment  Provider Notified: Lovell Sheehan, MD Notification mode: Routed and/or faxed via [place] of concern: Lab Results  Component Value Date   STAPHAUREUS POSITIVE (A) 12/21/2020   MRSAPCR POSITIVE (A) 12/21/2020    Notes: Patient is scheduled for a TOTAL KNEE ARTHROPLASTY (Right Knee) on 01/02/2021. She is scheduled to receive CEFAZOLIN pre-operatively. Surgical PCR (+) for MRSA; see above.  PLANS:  1. Review renal function. Estimated Creatinine Clearance: 55.9 mL/min (A) (by C-G formula based on SCr of 1.16 mg/dL (H)). 2. Review allergies. No documented allergy to vancomycin. 3. Spoke with Ronnald Ramp, PA-C about this patient.   Discussed plans to add Vancomycin order.   PA confirmed that order should be for Vancomycin only.   CEFAZOLIN discontinued.   Order added for VANCOMYCIN 1 GRAM IV to preoperative prophylactic regimen.   4. Notify primary attending surgeon of (+) MRSA result and additional order placed for antibiotic by perioperative APP.   This is a Community education officer; no formal response is required.  Honor Loh, MSN, APRN, FNP-C, CEN [facility]  Peri-operative Services Nurse Practitioner Phone: [phone] 12/21/20 2:09 PM

## 2020-12-21 NOTE — Patient Instructions (Signed)
Your procedure is scheduled on: 01/02/21 Report to [place]. To find out your arrival time please call [phone] between 1PM - 3PM on 01/01/21 .  Remember: Instructions that are not followed completely may result in serious medical risk, up to and including death, or upon the discretion of your surgeon and anesthesiologist your surgery may need to be rescheduled.     _X__ 1. Do not eat food or drink liquids after midnight the night before your procedure.                   __X__2.  On the morning of surgery brush your teeth with toothpaste and water, you                 may rinse your mouth with mouthwash if you wish.  Do not swallow any              toothpaste of mouthwash.     _X__ 3.  No Alcohol for 24 hours before or after surgery.   _X__ 4.  Do Not Smoke or use e-cigarettes For 24 Hours Prior to Your Surgery.                 Do not use any chewable tobacco products for at least 6 hours prior to                 surgery.  ____  5.  Bring all medications with you on the day of surgery if instructed.   __X__  6.  Notify your doctor if there is any change in your medical condition      (cold, fever, infections).     Do not wear jewelry, make-up, hairpins, clips or nail polish. Do not wear lotions, powders, or perfumes.  Do not shave 48 hours prior to surgery. Men may shave face and neck. Do not bring valuables to the hospital.    [facility] is not responsible for any belongings or valuables.  Contacts, dentures/partials or body piercings may not be worn into surgery. Bring a case for your contacts, glasses or hearing aids, a denture cup will be supplied. Leave your suitcase in the car. After surgery it may be brought to your room. For patients admitted to the hospital, discharge time is determined by your treatment team.   Patients discharged the day of surgery will not be allowed to drive home.   Please read over the  following fact sheets that you were given:   MRSA Information, chg soap, incentive spirometer  __X__ Take these medicines the morning of surgery with A SIP OF WATER:    1. amLODipine (NORVASC) 5 MG tablet  2. carvedilol (COREG) 6.25 MG tablet  3. ezetimibe (ZETIA) 10 MG tablet  4. hydrALAZINE (APRESOLINE) 25 MG tablet  5.  6.  ____ Fleet Enema (as directed)   __X__ Use CHG Soap/SAGE wipes as directed  __X__ Use inhalers on the day of surgery  ____ Stop metformin/Janumet/Farxiga 2 days prior to surgery    ____ Take 1/2 of usual insulin dose the night before surgery. No insulin the morning          of surgery.   ____ Stop Blood Thinners Coumadin/Plavix/Xarelto/Pleta/Pradaxa/Eliquis/Effient/Aspirin  on   Or contact your Surgeon, Cardiologist or Medical Doctor regarding  ability to stop your blood thinners  __X__ Stop Anti-inflammatories 7 days before surgery such as Advil, Ibuprofen, Motrin,  BC or Goodies Powder, Naprosyn, Naproxen,  Aleve, Aspirin    __X__ Stop all herbal supplements, fish oil or vitamin E until after surgery.    ____ Bring C-Pap to the hospital.

## 2020-12-24 ENCOUNTER — Telehealth: Payer: Medicare Other | Admitting: Gastroenterology

## 2020-12-24 ENCOUNTER — Encounter: Payer: Self-pay | Admitting: Gastroenterology

## 2020-12-25 ENCOUNTER — Encounter: Payer: Self-pay | Admitting: Orthopedic Surgery

## 2020-12-25 DIAGNOSIS — M25561 Pain in right knee: Secondary | ICD-10-CM | POA: Diagnosis not present

## 2020-12-25 NOTE — Progress Notes (Signed)
Perioperative Services  Pre-Admission/Anesthesia Testing Clinical Review  Date: 12/25/20  Patient Demographics:  Name: Whitney [name] DOB:   January 15, 1951 MRN:   [id number]  Planned Surgical Procedure(s):    Case: [id number] Date/Time: 01/02/21 0815   Procedure: TOTAL KNEE ARTHROPLASTY (Right Knee)   Anesthesia type: Spinal   Pre-op diagnosis: M17.11 Unilateral primary osteoarthritis, right knee   Location: [facility] OR ROOM 02 / [place] [facility]   Surgeons: Lovell Sheehan, MD    NOTE: Available PAT nursing documentation and vital signs have been reviewed. Clinical nursing staff has updated patient's PMH/PSHx, current medication list, and drug allergies/intolerances to ensure comprehensive history available to assist in medical decision making as it pertains to the aforementioned surgical procedure and anticipated anesthetic course.   Clinical Discussion:  Whitney [name] is a 70 y.o. female who is submitted for pre-surgical anesthesia review and clearance prior to her undergoing the above procedure. Patient has never been a smoker. Pertinent PMH includes: CAD, PAF, palpitations, symptomatic bradycardia, aortic atherosclerosis, HTN, HLD, OSAH (does not use nocturnal PAP therapy), exertional dyspnea, CKD (s/p nephrectomy), OA, lower back pain, anxiety.  Patient is followed by cardiology Ubaldo Glassing, MD). She was last seen in the cardiology clinic on 12/11/2020; notes reviewed.  At the time of her clinic visit, patient doing "fairly well from a cardiovascular perspective.  She reported that she was feeling much better as compared to her previous visits.  Patient denied any chest pain, shortness breath, PND, orthopnea, palpitations, vertiginous symptoms, or presyncope/syncope.  Patient with a significant cardiovascular history:    Myocardial perfusion imaging study performed on 06/30/2017 demonstrated no evidence of stress-induced myocardial ischemia or arrhythmia and a moderately reduced LVEF  of 30-44%.  Subsequent TTE performed on 06/29/2017 revealed normal left ventricular systolic function with mild LVH; LVEF 60 to 65%.  No evidence of valvular insufficiency or stenosis noted.    Diagnostic left heart catheterization and 10/2017 revealed insignificant nonobstructive CAD; 60% stenosis of the distal RCA and 50% stenosis of proximal LAD noted.  LVEF 55-65%.  LVEDP normal.  No evidence of significant valvular insufficiency or stenosis noted.   Stress echocardiogram performed on 11/08/2020 revealed an LVEF >55% with no evidence of valvular stenosis (see full interpretation of cardiovascular testing below).  Patient with a diagnosis of paroxysmal atrial fibrillation.  CHA2DS2-VASc Score = 4 (age, sex, HTN, aortic plaque).  Patient is not currently chronically anticoagulated.  Rate and rhythm controlled on beta-blocker and as needed flecainide.  Patient on GDMT for her HTN and HLD diagnoses.  Blood pressure moderately controlled at 142/88 on currently prescribed CCB, beta-blocker, and vasodilator therapies.  Patient takes his ezetimibe for her HLD diagnosis. Functional capacity, as defined by DASI, is documented as being >/= 4 METS.  No changes were made to patient's medication regimen.  Patient to follow-up with outpatient cardiology in 6 months or sooner if needed.  Patient is scheduled for an elective total knee arthroplasty on 01/02/2021 with Dr. Kurtis Bushman.  Given patient's past medical history significant for cardiovascular diagnoses, presurgical cardiac clearance was sought by the PAT team.  Per cardiology, "patient had an unremarkable cardiac catheterization in 2019 and negative stress echo with good LV function in 2020.  She has had no change in her functional status since that time.  Her EKG shows no new changes.  Patient has occasional PVCs but no sustained arrhythmias.  She denies syncope.  Patient appears to be optimized from a cardiac standpoint for surgery.  We will  proceed with no  further cardiac work-up with AN ACCEPTABLE risk of significant perioperative cardiovascular complication".  This patient is not currently on any prescribed anticoagulation/antiplatelet therapy.    Patient denies previous perioperative complications with anesthesia in the past. In review of the available records, it is noted that patient underwent a general anesthetic course here (ASA III) in 11/2019 without documented complications.   Vitals with BMI 12/21/2020 12/17/2020 11/22/2020  Height 5\' 5"  5\' 5"  5\' 5"   Weight 244 lbs 245 lbs 8 oz 245 lbs 6 oz  BMI 40.6 78.29 56.21  Systolic 308 657 846  Diastolic 84 96 99  Pulse 72 82 72    Providers/Specialists:   NOTE: Primary physician provider listed below. Patient may have been seen by APP or partner within same practice.   PROVIDER ROLE / SPECIALTY LAST OV  Lovell Sheehan, MD  Orthopedics (Surgeon)  10/02/2020  Lavera Guise, MD  Primary Care Provider  12/17/2020  Bartholome Bill, MD  Cardiology  12/11/2020  Anthonette Legato, MD  Nephrology  10/09/2020   Allergies:  Enalapril, Lisinopril, and Ace inhibitors  Current Home Medications:   No current facility-administered medications for this encounter.   Marland Kitchen albuterol (VENTOLIN HFA) 108 (90 Base) MCG/ACT inhaler  . amLODipine (NORVASC) 5 MG tablet  . carvedilol (COREG) 6.25 MG tablet  . Cholecalciferol (VITAMIN D) 50 MCG (2000 UT) tablet  . diclofenac Sodium (VOLTAREN) 1 % GEL  . ezetimibe (ZETIA) 10 MG tablet  . hydrALAZINE (APRESOLINE) 25 MG tablet  . orlistat (ALLI) 60 MG capsule  . rOPINIRole (REQUIP) 0.5 MG tablet   History:   Past Medical History:  Diagnosis Date  . Abnormal Pap smear of cervix    Pt states she had colposcopy   . Anxiety   . Aortic atherosclerosis ([place])   . Arthritis   . CAD (coronary artery disease)   . Chronic kidney disease    s/p LEFT nephrectomy  . Chronic lower back pain   . Depression   . Diverticulitis   . Dyspnea   . Hx of unilateral  nephrectomy 1979   Left Nephrectomy  . Hypertension   . Lower extremity edema   . PAF (paroxysmal atrial fibrillation) ([place])   . Palpitations   . PMB (postmenopausal bleeding)   . Restless leg syndrome   . Sleep apnea    has no cpap  . Symptomatic bradycardia   . Vitamin D deficiency    Past Surgical History:  Procedure Laterality Date  . BIOPSY  10/31/2019   Procedure: BIOPSY;  Surgeon: Rush Landmark Telford Nab., MD;  Location: [place];  Service: Gastroenterology;;  . CARDIAC CATHETERIZATION    . CHOLECYSTECTOMY    . COLONOSCOPY WITH PROPOFOL N/A 06/13/2019   Procedure: COLONOSCOPY WITH PROPOFOL;  Surgeon: [name], MD;  Location: [facility] ENDOSCOPY;  Service: Gastroenterology;  Laterality: N/A;  . COLONOSCOPY WITH PROPOFOL N/A 08/12/2019   Procedure: COLONOSCOPY WITH PROPOFOL;  Surgeon: [name], MD;  Location: [facility] ENDOSCOPY;  Service: Gastroenterology;  Laterality: N/A;  . COLONOSCOPY WITH PROPOFOL N/A 09/19/2019   Procedure: COLONOSCOPY WITH PROPOFOL;  Surgeon: [name], MD;  Location: [facility] ENDOSCOPY;  Service: Gastroenterology;  Laterality: N/A;  . DILATION AND CURETTAGE OF UTERUS    . ESOPHAGOGASTRODUODENOSCOPY (EGD) WITH PROPOFOL N/A 06/13/2019   Procedure: ESOPHAGOGASTRODUODENOSCOPY (EGD) WITH PROPOFOL;  Surgeon: [name], MD;  Location: [facility] ENDOSCOPY;  Service: Gastroenterology;  Laterality: N/A;  Pt will go for COVID test on 96-2-95 as she uses public transportation and will  be in the area on that day.   . ESOPHAGOGASTRODUODENOSCOPY (EGD) WITH PROPOFOL N/A 10/31/2019   Procedure: ESOPHAGOGASTRODUODENOSCOPY (EGD) WITH PROPOFOL;  Surgeon: Rush Landmark Telford Nab., MD;  Location: [place];  Service: Gastroenterology;  Laterality: N/A;  . HEMOSTASIS CLIP PLACEMENT  10/31/2019   Procedure: HEMOSTASIS CLIP PLACEMENT;  Surgeon: Irving Copas., MD;  Location: [place];  Service: Gastroenterology;;  . HERNIA REPAIR    . HYSTEROSCOPY WITH D & C N/A 02/16/2017    Procedure: DILATATION AND CURETTAGE /HYSTEROSCOPY;  Surgeon: Defrancesco, Alanda Slim, MD;  Location: [facility] ORS;  Service: Gynecology;  Laterality: N/A;  . JOINT REPLACEMENT Left    knee  . kidney removal Left   . KIDNEY SURGERY Left 1979   left kidney removed  . LEFT HEART CATH AND CORONARY ANGIOGRAPHY N/A 10/21/2017   Procedure: LEFT HEART CATH AND CORONARY ANGIOGRAPHY;  Surgeon: Teodoro Spray, MD;  Location: [place] [facility];  Service: Cardiovascular;  Laterality: N/A;  . POLYPECTOMY  10/31/2019   Procedure: POLYPECTOMY;  Surgeon: Rush Landmark Telford Nab., MD;  Location: [place];  Service: Gastroenterology;;  . TOTAL KNEE ARTHROPLASTY Left 07/06/2017   Procedure: TOTAL KNEE ARTHROPLASTY;  Surgeon: Lovell Sheehan, MD;  Location: [facility] ORS;  Service: Orthopedics;  Laterality: Left;  . UPPER ESOPHAGEAL ENDOSCOPIC ULTRASOUND (EUS) N/A 10/31/2019   Procedure: UPPER ESOPHAGEAL ENDOSCOPIC ULTRASOUND (EUS);  Surgeon: Irving Copas., MD;  Location: [place];  Service: Gastroenterology;  Laterality: N/A;  . WRIST ARTHROSCOPY Left 1970s  . XI ROBOTIC ASSISTED VENTRAL HERNIA N/A 12/01/2019   Procedure: XI ROBOTIC ASSISTED VENTRAL HERNIA;  Surgeon: Jules Husbands, MD;  Location: [facility] ORS;  Service: General;  Laterality: N/A;   Family History  Problem Relation Age of Onset  . Ovarian cancer Mother   . Hypertension Father   . Diabetes Father   . Cancer Father   . Breast cancer Sister   . Diabetes Sister    Social History   Tobacco Use  . Smoking status: Never Smoker  . Smokeless tobacco: Never Used  Vaping Use  . Vaping Use: Never used  Substance Use Topics  . Alcohol use: Yes    Alcohol/week: 2.0 standard drinks    Types: 2 Standard drinks or equivalent per week    Comment: occasionally  . Drug use: Yes    Frequency: 3.0 times per week    Types: Marijuana    Comment: 07/09/20 Pt states she uses for leg pain: marijuana; states no longer uses cocaine    Pertinent Clinical  Results:  LABS: Labs reviewed: Acceptable for surgery.  No visits with results within 3 Day(s) from this visit.  Latest known visit with results is:  Hospital Outpatient Visit on 12/21/2020  Component Date Value Ref Range Status  . WBC 12/21/2020 3.9* 4.0 - 10.5 K/uL Final  . RBC 12/21/2020 4.51  3.87 - 5.11 MIL/uL Final  . Hemoglobin 12/21/2020 13.3  12.0 - 15.0 g/dL Final  . HCT 12/21/2020 41.3  36.0 - 46.0 % Final  . MCV 12/21/2020 91.6  80.0 - 100.0 fL Final  . MCH 12/21/2020 29.5  26.0 - 34.0 pg Final  . MCHC 12/21/2020 32.2  30.0 - 36.0 g/dL Final  . RDW 12/21/2020 13.1  11.5 - 15.5 % Final  . Platelets 12/21/2020 174  150 - 400 K/uL Final  . nRBC 12/21/2020 0.0  0.0 - 0.2 % Final   Performed at [facility], [address] [place], [place] [address]  . Sodium  12/21/2020 134* 135 - 145 mmol/L Final  . Potassium 12/21/2020 4.3  3.5 - 5.1 mmol/L Final  . Chloride 12/21/2020 102  98 - 111 mmol/L Final  . CO2 12/21/2020 26  22 - 32 mmol/L Final  . Glucose, Bld 12/21/2020 92  70 - 99 mg/dL Final   Glucose reference range applies only to samples taken after fasting for at least 8 hours.  . BUN 12/21/2020 15  8 - 23 mg/dL Final  . Creatinine, Ser 12/21/2020 1.16* 0.44 - 1.00 mg/dL Final  . Calcium 12/21/2020 8.0* 8.9 - 10.3 mg/dL Final  . GFR, Estimated 12/21/2020 51* >60 mL/min Final   Comment: (NOTE) Calculated using the CKD-EPI Creatinine Equation (2021)   . Anion gap 12/21/2020 6  5 - 15 Final   Performed at [facility], [address] [place], [place] [address]  . Prothrombin Time 12/21/2020 13.4  11.4 - 15.2 seconds Final  . INR 12/21/2020 1.0  0.8 - 1.2 Final   Comment: (NOTE) INR goal varies based on device and disease states. Performed at [facility], [address] [place], [place] [address]   . aPTT 12/21/2020 28  24 - 36 seconds Final   Performed at [facility], [address] [place], [place] [address]  . Color,  Urine 12/21/2020 YELLOW* YELLOW Final  . APPearance 12/21/2020 CLEAR* CLEAR Final  . Specific Gravity, Urine 12/21/2020 1.014  1.005 - 1.030 Final  . pH 12/21/2020 6.0  5.0 - 8.0 Final  . Glucose, UA 12/21/2020 NEGATIVE  NEGATIVE mg/dL Final  . Hgb urine dipstick 12/21/2020 NEGATIVE  NEGATIVE Final  . Bilirubin Urine 12/21/2020 NEGATIVE  NEGATIVE Final  . Ketones, ur 12/21/2020 NEGATIVE  NEGATIVE mg/dL Final  . Protein, ur 12/21/2020 >=300* NEGATIVE mg/dL Final  . Nitrite 12/21/2020 NEGATIVE  NEGATIVE Final  . Chalmers Guest 12/21/2020 NEGATIVE  NEGATIVE Final  . RBC / HPF 12/21/2020 0-5  0 - 5 RBC/hpf Final  . WBC, UA 12/21/2020 0-5  0 - 5 WBC/hpf Final  . Bacteria, UA 12/21/2020 NONE SEEN  NONE SEEN Final  . Squamous Epithelial / LPF 12/21/2020 0-5  0 - 5 Final  . Mucus 12/21/2020 PRESENT   Final   Performed at [facility], [address], [place], [place] [address]  . ABO/RH(D) 12/21/2020 B POS   Final  . Antibody Screen 12/21/2020 NEG   Final  . Sample Expiration 12/21/2020 01/04/2021,2359   Final  . Extend sample reason 12/21/2020    Final                   Value:NO TRANSFUSIONS OR PREGNANCY IN THE PAST 3 MONTHS Performed at [facility], [address] [place], [place] [address]   . MRSA, PCR 12/21/2020 POSITIVE* NEGATIVE Final   Comment: RESULT CALLED TO, READ BACK BY AND VERIFIED WITH: BRIAN Lauro Manlove @1357  12/21/20 MJU   . Staphylococcus aureus 12/21/2020 POSITIVE* NEGATIVE Final   Comment: (NOTE) The Xpert SA Assay (FDA approved for NASAL specimens in patients 3 years of age and older), is one component of a comprehensive surveillance program. It is not intended to diagnose infection nor to guide or monitor treatment. Performed at [facility], [address] [place], [place] [address]     ECG: Date: 12/11/2020 Rate: 71 bpm Rhythm: normal sinus Axis (leads I and aVF): Normal Intervals: PR 142 ms. QRS 78 ms. QTc 482 ms. ST segment and T  wave changes: No evidence of acute ST segment  elevation or depression Comparison: Similar to previous tracing obtained on 06/26/2020   IMAGING / PROCEDURES: CT ABDOMEN PELVIS WITH CONTRAST performed on 11/22/2020 1. Acute diverticulitis of the distal descending/sigmoid junction 2. No diverticular abscess or perforation 3. Solitary right kidney with no evidence of hydronephrosis or nephrolithiasis 4. Aortic atherosclerosis  BILATERAL CAROTID DOPPLER performed on 04/13/2020 1. The RIGHT CAROTID shows less than 50% stenosis.  2. The LEFT CAROTID shows less than 50% stenosis.  3. There is no significant plaque formation noted on the LEFT and no significant plaque on the RIGHT side.  4. Consider a repeat Carotid doppler if clinical situation and symptoms warrant in 6-12 months. Patient should be encouraged to change lifestyles such as smoking cessation, regular exercise and dietary modification. Use of statins in the right clinical setting and ASA is encouraged.    LEFT HEART CATHETERIZATION AND CORONARY ANGIOGRAPHY performed on 10/21/2017 1. Normal left ventricular systolic function; LVEF 55 to 65% 2. Insignificant CAD  60% stenosis of the distal RCA  50% stenosis of the proximal LAD 3. Normal LVEDP 4. No MD regurgitation 5. No aortic or mitral valve stenosis 6. No MVP evident 7. Recommendation is for medical management   Impression and Plan:  Whitney [name] has been referred for pre-anesthesia review and clearance prior to her undergoing the planned anesthetic and procedural courses. Available labs, pertinent testing, and imaging results were personally reviewed by me. This patient has been appropriately cleared by cardiology with an overall ACCEPTABLE risk of significant perioperative cardiovascular complications.  Based on clinical review performed today (12/25/20), barring any significant acute changes in the patient's overall condition, it is anticipated that she will be able to  proceed with the planned surgical intervention. Any acute changes in clinical condition may necessitate her procedure being postponed and/or cancelled. Patient will meet with anesthesia team (MD and/or CRNA) on the day of her procedure for preoperative evaluation/assessment. Questions regarding anesthetic course will be fielded at that time.   Pre-surgical instructions were reviewed with the patient during her PAT appointment and questions were fielded by PAT clinical staff. Patient was advised that if any questions or concerns arise prior to her procedure then she should return a call to PAT and/or her surgeon's office to discuss.  Honor Loh, MSN, APRN, FNP-C, CEN [facility]  Peri-operative Services Nurse Practitioner Phone: [phone] 12/25/20 2:13 PM  NOTE: This note has been prepared using Dragon dictation software. Despite my best ability to proofread, there is always the potential that unintentional transcriptional errors may still occur from this process.

## 2020-12-26 ENCOUNTER — Other Ambulatory Visit: Payer: Self-pay

## 2020-12-26 ENCOUNTER — Ambulatory Visit: Payer: Medicare Other

## 2020-12-26 DIAGNOSIS — I10 Essential (primary) hypertension: Secondary | ICD-10-CM

## 2020-12-26 DIAGNOSIS — R0602 Shortness of breath: Secondary | ICD-10-CM | POA: Diagnosis not present

## 2020-12-26 DIAGNOSIS — G4733 Obstructive sleep apnea (adult) (pediatric): Secondary | ICD-10-CM

## 2020-12-28 ENCOUNTER — Other Ambulatory Visit
Admission: RE | Admit: 2020-12-28 | Discharge: 2020-12-28 | Disposition: A | Discharge status: Home / Self Care | Payer: Medicare Other | Source: Ambulatory Visit | Attending: Orthopedic Surgery | Admitting: Orthopedic Surgery

## 2020-12-28 ENCOUNTER — Other Ambulatory Visit: Payer: Self-pay

## 2020-12-28 DIAGNOSIS — Z01812 Encounter for preprocedural laboratory examination: Secondary | ICD-10-CM | POA: Diagnosis not present

## 2020-12-28 DIAGNOSIS — U071 COVID-19: Secondary | ICD-10-CM | POA: Diagnosis not present

## 2020-12-28 LAB — SARS CORONAVIRUS 2 (TAT 6-24 HRS): SARS Coronavirus 2: POSITIVE — AB

## 2021-01-01 ENCOUNTER — Encounter: Payer: Self-pay | Admitting: Internal Medicine

## 2021-01-01 ENCOUNTER — Ambulatory Visit (INDEPENDENT_AMBULATORY_CARE_PROVIDER_SITE_OTHER): Payer: Medicare Other | Admitting: Internal Medicine

## 2021-01-01 ENCOUNTER — Other Ambulatory Visit: Payer: Self-pay

## 2021-01-01 ENCOUNTER — Telehealth: Payer: Self-pay

## 2021-01-01 ENCOUNTER — Ambulatory Visit: Payer: Medicare Other | Admitting: Hospice and Palliative Medicine

## 2021-01-01 VITALS — BP 148/74 | HR 74 | Temp 98.4°F | Resp 16 | Ht 65.0 in | Wt 243.0 lb

## 2021-01-01 DIAGNOSIS — Z01818 Encounter for other preprocedural examination: Secondary | ICD-10-CM

## 2021-01-01 DIAGNOSIS — I10 Essential (primary) hypertension: Secondary | ICD-10-CM | POA: Diagnosis not present

## 2021-01-01 DIAGNOSIS — U071 COVID-19: Secondary | ICD-10-CM

## 2021-01-01 NOTE — Progress Notes (Signed)
[facility] [place], [place] [address]  Internal MEDICINE  Office Visit Note  Patient Name: Whitney [name]  [id number]  [id number]  Date of Service: 01/01/2021  Chief Complaint  Patient presents with  . Follow-up    Med review     HPI  Patient is here for routine follow-up she is supposed to have right TKA. Her blood pressure has been elevated and her medications were adjusted and optimized on previous visit. Patient notified CMA that she will went for preop testing and was tested positive for COVID however she is asymptomatic denies any shortness of breath or chest pain her blood pressure is under better control patient is on carvedilol 6.25 twice daily hydralazine 25 mg 3 times daily amlodipine 5 mg twice a day   Current Medication: Outpatient Encounter Medications as of 01/01/2021  Medication Sig Note  . albuterol (VENTOLIN HFA) 108 (90 Base) MCG/ACT inhaler Inhale 2 puffs into the lungs every 4 (four) hours as needed for wheezing or shortness of breath.   Marland Kitchen amLODipine (NORVASC) 5 MG tablet Take 1 tablet (5 mg total) by mouth 2 (two) times daily.   . carvedilol (COREG) 6.25 MG tablet Take 1 tablet (6.25 mg total) by mouth 2 (two) times daily with a meal.   . Cholecalciferol (VITAMIN D) 50 MCG (2000 UT) tablet Take 2,000 Units by mouth in the morning and at bedtime.    . diclofenac Sodium (VOLTAREN) 1 % GEL Apply 2 g topically daily as needed (Knee pain).   Marland Kitchen ezetimibe (ZETIA) 10 MG tablet Take 1 tablet (10 mg total) by mouth daily. 12/17/2020: Take sometimes  . hydrALAZINE (APRESOLINE) 25 MG tablet Take 1 tablet (25 mg total) by mouth 3 (three) times daily. For HTN   . orlistat (ALLI) 60 MG capsule Take 60 mg by mouth 3 (three) times daily with meals.   Marland Kitchen rOPINIRole (REQUIP) 0.5 MG tablet Take 0.5-1 mg by mouth 2 (two) times daily as needed (pain).    No facility-administered encounter medications on file as of 01/01/2021.    Surgical History: Past  Surgical History:  Procedure Laterality Date  . BIOPSY  10/31/2019   Procedure: BIOPSY;  Surgeon: Rush Landmark Telford Nab., MD;  Location: [place];  Service: Gastroenterology;;  . CARDIAC CATHETERIZATION    . CHOLECYSTECTOMY    . COLONOSCOPY WITH PROPOFOL N/A 06/13/2019   Procedure: COLONOSCOPY WITH PROPOFOL;  Surgeon: [name], MD;  Location: [facility] ENDOSCOPY;  Service: Gastroenterology;  Laterality: N/A;  . COLONOSCOPY WITH PROPOFOL N/A 08/12/2019   Procedure: COLONOSCOPY WITH PROPOFOL;  Surgeon: [name], MD;  Location[facility] ENDOSCOPY;  Service: Gastroenterology;  Laterality: N/A;  . COLONOSCOPY WITH PROPOFOL N/A 09/19/2019   Procedure: COLONOSCOPY WITH PROPOFOL;  Surgeon: [name], MD;  Location: [facility] ENDOSCOPY;  Service: Gastroenterology;  Laterality: N/A;  . DILATION AND CURETTAGE OF UTERUS    . ESOPHAGOGASTRODUODENOSCOPY (EGD) WITH PROPOFOL N/A 06/13/2019   Procedure: ESOPHAGOGASTRODUODENOSCOPY (EGD) WITH PROPOFOL;  Surgeon: [name], MD;  Location: [facility] ENDOSCOPY;  Service: Gastroenterology;  Laterality: N/A;  Pt will go for COVID test on 09-10-72 as she uses public transportation and will be in the area on that day.   . ESOPHAGOGASTRODUODENOSCOPY (EGD) WITH PROPOFOL N/A 10/31/2019   Procedure: ESOPHAGOGASTRODUODENOSCOPY (EGD) WITH PROPOFOL;  Surgeon: Rush Landmark Telford Nab., MD;  Location: [place];  Service: Gastroenterology;  Laterality: N/A;  . HEMOSTASIS CLIP PLACEMENT  10/31/2019   Procedure: HEMOSTASIS CLIP PLACEMENT;  Surgeon: Rush Landmark Telford Nab., MD;  Location: [facility]  ENDOSCOPY;  Service: Gastroenterology;;  . HERNIA REPAIR    . HYSTEROSCOPY WITH D & C N/A 02/16/2017   Procedure: DILATATION AND CURETTAGE /HYSTEROSCOPY;  Surgeon: Defrancesco, Alanda Slim, MD;  Location: [facility] ORS;  Service: Gynecology;  Laterality: N/A;  . JOINT REPLACEMENT Left    knee  . kidney removal Left   . KIDNEY SURGERY Left 1979   left kidney removed  . LEFT HEART CATH AND CORONARY ANGIOGRAPHY  N/A 10/21/2017   Procedure: LEFT HEART CATH AND CORONARY ANGIOGRAPHY;  Surgeon: Teodoro Spray, MD;  Location: [place] CV LAB;  Service: Cardiovascular;  Laterality: N/A;  . POLYPECTOMY  10/31/2019   Procedure: POLYPECTOMY;  Surgeon: Rush Landmark Telford Nab., MD;  Location: [place];  Service: Gastroenterology;;  . TOTAL KNEE ARTHROPLASTY Left 07/06/2017   Procedure: TOTAL KNEE ARTHROPLASTY;  Surgeon: Lovell Sheehan, MD;  Location: [facility] ORS;  Service: Orthopedics;  Laterality: Left;  . UPPER ESOPHAGEAL ENDOSCOPIC ULTRASOUND (EUS) N/A 10/31/2019   Procedure: UPPER ESOPHAGEAL ENDOSCOPIC ULTRASOUND (EUS);  Surgeon: Irving Copas., MD;  Location: [place];  Service: Gastroenterology;  Laterality: N/A;  . WRIST ARTHROSCOPY Left 1970s  . XI ROBOTIC ASSISTED VENTRAL HERNIA N/A 12/01/2019   Procedure: XI ROBOTIC ASSISTED VENTRAL HERNIA;  Surgeon: Jules Husbands, MD;  Location: [facility] ORS;  Service: General;  Laterality: N/A;    Medical History: Past Medical History:  Diagnosis Date  . Abnormal Pap smear of cervix    Pt states she had colposcopy   . Anxiety   . Aortic atherosclerosis ([place])   . Arthritis   . CAD (coronary artery disease)   . Chronic kidney disease    s/p LEFT nephrectomy  . Chronic lower back pain   . Depression   . Diverticulitis   . Dyspnea   . Hx of unilateral nephrectomy 1979   Left Nephrectomy  . Hypertension   . Lower extremity edema   . PAF (paroxysmal atrial fibrillation) ([place])   . Palpitations   . PMB (postmenopausal bleeding)   . Restless leg syndrome   . Sleep apnea    has no cpap  . Symptomatic bradycardia   . Vitamin D deficiency     Family History: Family History  Problem Relation Age of Onset  . Ovarian cancer Mother   . Hypertension Father   . Diabetes Father   . Cancer Father   . Breast cancer Sister   . Diabetes Sister     Social History   Socioeconomic History  . Marital status: Divorced    Spouse name: Not on file   . Number of children: Not on file  . Years of education: Not on file  . Highest education level: Not on file  Occupational History  . Not on file  Tobacco Use  . Smoking status: Never Smoker  . Smokeless tobacco: Never Used  Vaping Use  . Vaping Use: Never used  Substance and Sexual Activity  . Alcohol use: Yes    Alcohol/week: 2.0 standard drinks    Types: 2 Standard drinks or equivalent per week    Comment: occasionally  . Drug use: Yes    Frequency: 3.0 times per week    Types: Marijuana    Comment: 07/09/20 Pt states she uses for leg pain: marijuana; states no longer uses cocaine  . Sexual activity: Yes    Birth control/protection: None  Other Topics Concern  . Not on file  Social History Narrative   Lives with cousin.   Social Determinants of  Health   Financial Resource Strain: Not on file  Food Insecurity: Not on file  Transportation Needs: Not on file  Physical Activity: Not on file  Stress: Not on file  Social Connections: Not on file  Intimate Partner Violence: Not on file      Review of Systems  Constitutional: Negative for chills, diaphoresis and fatigue.  HENT: Negative for ear pain, postnasal drip and sinus pressure.   Eyes: Negative for photophobia, discharge, redness, itching and visual disturbance.  Respiratory: Negative for cough, shortness of breath and wheezing.   Cardiovascular: Negative for chest pain, palpitations and leg swelling.  Gastrointestinal: Negative for abdominal pain, constipation, diarrhea, nausea and vomiting.  Genitourinary: Negative for dysuria and flank pain.  Musculoskeletal: Negative for arthralgias, back pain, gait problem and neck pain.  Skin: Negative for color change.  Allergic/Immunologic: Negative for environmental allergies and food allergies.  Neurological: Negative for dizziness and headaches.  Hematological: Does not bruise/bleed easily.  Psychiatric/Behavioral: Negative for agitation, behavioral problems  (depression) and hallucinations.    Vital Signs: BP (!) 148/74   Pulse 74   Temp 98.4 F (36.9 C)   Resp 16   Ht 5\' 5"  (1.651 m)   Wt 243 lb (110.2 kg)   LMP 01/26/2017 Comment: age 51  SpO2 93%   BMI 40.44 kg/m   No exam is done due to covid positive       Assessment/Plan: 1. COVID It was noticed in her chart that patient tested positive however she did not communicate this with the front staff she is already scheduled to have another COVID test before her surgery  2. Benign hypertension Blood pressure is improved patient is supposed to take all her medication she is on triple therapy for blood pressure including carvedilol hydralazine and amlodipine patient again is supposed to have TKA  3. Preop examination Patient is medically cleared for surgery her blood pressure is improved  General Counseling: Luccia verbalizes understanding of the findings of todays visit and agrees with plan of treatment. I have discussed any further diagnostic evaluation that may be needed or ordered today. We also reviewed her medications today. she has been encouraged to call the office with any questions or concerns that should arise related to todays visit.  Total time spent:25 Minutes Time spent includes review of chart, medications, test results, and follow up plan with the patient.   [place] Sub[place] Database was reviewed by me.   Dr Lavera Guise Internal medicine

## 2021-01-01 NOTE — Telephone Encounter (Signed)
Surgical release signed and faxed to [phone]

## 2021-01-02 ENCOUNTER — Other Ambulatory Visit: Payer: Self-pay

## 2021-01-02 DIAGNOSIS — I10 Essential (primary) hypertension: Secondary | ICD-10-CM

## 2021-01-02 MED ORDER — AMLODIPINE BESYLATE 5 MG PO TABS: 5.0000 mg | ORAL_TABLET | Freq: Two times a day (BID) | ORAL | 2 refills | Status: DC

## 2021-01-08 ENCOUNTER — Telehealth: Payer: Self-pay | Admitting: Internal Medicine

## 2021-01-08 NOTE — Chronic Care Management (AMB) (Signed)
  Chronic Care Management   Outreach Note  01/08/2021 Name: Whitney [name] MRN: [id number] DOB: Nov 23, 1950  Referred by: Lavera Guise, MD Reason for referral : No chief complaint on file.   An unsuccessful telephone outreach was attempted today. The patient was referred to the pharmacist for assistance with care management and care coordination.   Follow Up Plan:   Tatjana Dellinger Upstream Scheduler

## 2021-01-15 ENCOUNTER — Telehealth: Payer: Self-pay | Admitting: Internal Medicine

## 2021-01-15 NOTE — Chronic Care Management (AMB) (Signed)
  Chronic Care Management   Outreach Note  01/15/2021 Name: Whitney [name] MRN: [id number] DOB: 03-04-1951  Referred by: Lavera Guise, MD Reason for referral : No chief complaint on file.   A second unsuccessful telephone outreach was attempted today. The patient was referred to pharmacist for assistance with care management and care coordination.  Follow Up Plan:   Tatjana Dellinger Upstream Scheduler

## 2021-01-29 ENCOUNTER — Ambulatory Visit: Payer: Medicare Other | Admitting: Nurse Practitioner

## 2021-01-29 ENCOUNTER — Telehealth (INDEPENDENT_AMBULATORY_CARE_PROVIDER_SITE_OTHER): Payer: Medicare Other | Admitting: Gastroenterology

## 2021-01-29 DIAGNOSIS — K5792 Diverticulitis of intestine, part unspecified, without perforation or abscess without bleeding: Secondary | ICD-10-CM

## 2021-01-29 NOTE — Progress Notes (Signed)
Whitney [name] , MD [address]  [place]  [place], [place] [address]  Main: [phone]  Fax: [phone]   Primary Care Physician: Whitney Guise, MD  Virtual Visit via Video Note  I connected with patient on 01/29/21 at  1:30 PM EDT by video and verified that I am speaking with the correct person using two identifiers.   I discussed the limitations, risks, security and privacy concerns of performing an evaluation and management service by video  and the availability of in person appointments. I also discussed with the patient that there may be a patient responsible charge related to this service. The patient expressed understanding and agreed to proceed.  Location of Patient: Home Location of Provider: Home Persons involved: Patient and provider only   History of Present Illness:   C/c: Acute diverticulitis follow up    HPI: Whitney [name] is a 70 y.o. female  Summary of history :   Initially seen and referred on 11/02/2018 for abdominal pain.  Started after cholecystectomy   Abdominal CT scan in March 2019 showed  A small low-attenuation area within the body of the pancreas.     MRI of the abdomen in May 2019 which demonstrated benign liver abnormalities including multiple hemangiomas and cysts.  Gallstone.'s multiple small cystic foci throughout the pancreas.  Findings may represent small pseudocysts or areas of sidebranch duct ectasia.  Cystic neoplasm of the pancreas not excluded follow-up imaging in 24 months recommended.   10/19/2018 HIDA scan: Normal. 02/18/2019: H pylori breath test - negative 03/04/2019: MRCP- progression of the gall bladder polyp which is 10 mm in size . Pancreas lesion is stable repeat in 1 year . 06/13/2019 colonoscopy: Large quantity of stool seen in the rectum and sigmoid repeat colonoscopy recommended.   EGD.  Medium size hiatal hernia seen nodule seen just past the GE junction.  Biopsies of the stomach demonstrated H. pylori gastritis.    Underwent laparoscopic cholecystectomy with Dr. Dahlia Byes in November 2020 which resolved her longstanding abdominal pain   05/27/2019: CT scan of the abdomen and pelvis showed moderate supraumbilical midline ventral hernia containing inflamed flat and small amount of fluid.  Stable small pancreatic cysts.   09/19/2019: Colonoscopy: Poor prep and 3 polyps 4 to 6 mm were resected in the ascending colon with a cold snare.  Could not be retrieved since prep was poor    10/31/2019: EUS: Nodule at the GE junction was resected it was benign.  Biopsies of stomach showed chronic gastritis with focal intestinal metaplasia.  Cystic lesions of the pancreas were noted suggestive of IPMN.      12/01/2019: Ventral hernia repair.   02/15/2020: H. pylori breath test: Negative. 05/14/2020: Descending colon sigmoid colon diverticulitis     Interval history  11/07/2020 -01/29/2021   10/02/2020: CT scan of the abdomen pelvis with contrast shows pancreas with a 1 cm hypodense lesion in the distal body similar to CT dated 2019 no new changes.  Acute diverticulitis in the distal descending sigmoid junction.    She came in to see me in April 2021 she was having significant pain in the left lower quadrant and I suspect that she may be having acute diverticulitis and commenced on ciprofloxacin and Flagyl and it was confirmed on CT scan.  She was doing great after that and she noticed over the last few days has had some left lower quadrant pain which initially got worse but getting better now when she is on a  liquid diet.  She is not taking antibiotics at this point of time.  No other complaints.   Current Outpatient Medications  Medication Sig Dispense Refill   albuterol (VENTOLIN HFA) 108 (90 Base) MCG/ACT inhaler Inhale 2 puffs into the lungs every 4 (four) hours as needed for wheezing or shortness of breath. 1 each 3   amLODipine (NORVASC) 5 MG tablet Take 1 tablet (5 mg total) by mouth 2 (two) times daily. 60 tablet 2    carvedilol (COREG) 6.25 MG tablet Take 1 tablet (6.25 mg total) by mouth 2 (two) times daily with a meal. 60 tablet 3   Cholecalciferol (VITAMIN D) 50 MCG (2000 UT) tablet Take 2,000 Units by mouth in the morning and at bedtime.      diclofenac Sodium (VOLTAREN) 1 % GEL Apply 2 g topically daily as needed (Knee pain).     ezetimibe (ZETIA) 10 MG tablet Take 1 tablet (10 mg total) by mouth daily. 90 tablet 3   hydrALAZINE (APRESOLINE) 25 MG tablet Take 1 tablet (25 mg total) by mouth 3 (three) times daily. For HTN 90 tablet 3   orlistat (ALLI) 60 MG capsule Take 60 mg by mouth 3 (three) times daily with meals.     rOPINIRole (REQUIP) 0.5 MG tablet Take 0.5-1 mg by mouth 2 (two) times daily as needed (pain).     No current facility-administered medications for this visit.    Allergies as of 01/29/2021 - Review Complete 01/01/2021  Allergen Reaction Noted   Enalapril Swelling    Lisinopril Swelling 05/26/2016   Ace inhibitors Swelling 11/13/2020    Review of Systems:    All systems reviewed and negative except where noted in HPI.  General Appearance:    Alert, cooperative, no distress, appears stated age  Head:    Normocephalic, without obvious abnormality, atraumatic  Eyes:    PERRL, conjunctiva/corneas clear,  Ears:    Grossly normal hearing    Neurologic:  Grossly normal    Observations/Objective:  Labs: CMP     Component Value Date/Time   NA 134 (L) 12/21/2020 1209   NA 142 11/18/2019 1005   K 4.3 12/21/2020 1209   CL 102 12/21/2020 1209   CO2 26 12/21/2020 1209   GLUCOSE 92 12/21/2020 1209   BUN 15 12/21/2020 1209   BUN 18 11/18/2019 1005   CREATININE 1.16 (H) 12/21/2020 1209   CALCIUM 8.0 (L) 12/21/2020 1209   PROT 6.8 06/26/2020 2248   PROT 6.8 11/18/2019 1005   ALBUMIN 3.3 (L) 06/26/2020 2248   ALBUMIN 3.8 11/18/2019 1005   AST 15 06/26/2020 2248   ALT 12 06/26/2020 2248   ALKPHOS 69 06/26/2020 2248   BILITOT 0.6 06/26/2020 2248   BILITOT 0.3 11/18/2019 1005    GFRNONAA 51 (L) 12/21/2020 1209   GFRAA 54 (L) 11/18/2019 1005   Lab Results  Component Value Date   WBC 3.9 (L) 12/21/2020   HGB 13.3 12/21/2020   HCT 41.3 12/21/2020   MCV 91.6 12/21/2020   PLT 174 12/21/2020    Imaging Studies: No results found.  Assessment and Plan:   Whitney [name] is a 70 y.o. y/o female  here to follow up for acute diverticulitis which she was treated with antibiotics in April 2022.  She did well after that and recently had recurrence of symptoms which seems to be improving at this point of time.  She has not taken any antibiotics at this point of time.  I advised her to continue on  the conservative management for acute diverticulitis without antibiotics.  However she has been instructed that if her symptoms get worse she should feel free to call me right away so that we can commence her on antibiotics.  I have informed her to call me if she has fever or worsening of pain rectal bleeding or abdominal discomfort.  I will call her back in 2 weeks time to see how she is doing at that time the EGD and colonoscopy which has been postponed for gastric mapping and colon cancer screening due to poor prep previously        I discussed the assessment and treatment plan with the patient. The patient was provided an opportunity to ask questions and all were answered. The patient agreed with the plan and demonstrated an understanding of the instructions.   The patient was advised to call back or seek an in-person evaluation if the symptoms worsen or if the condition fails to improve as anticipated.  I provided 20 minutes of face-to-face time during this encounter.  Dr Whitney Bellows MD,MRCP [facility]) Gastroenterology/Hepatology Pager: [phone]   Speech recognition software was used to dictate this note.

## 2021-01-31 ENCOUNTER — Telehealth: Payer: Self-pay | Admitting: Internal Medicine

## 2021-01-31 NOTE — Chronic Care Management (AMB) (Signed)
  Chronic Care Management   Note  01/31/2021 Name: ELLENIE SALOME MRN: [id number] DOB: 06-24-1951  VERNELLA NIZNIK is a 70 y.o. year old female who is a primary care patient of Lavera Guise, MD. I reached out to Arlina Robes by phone today in response to a referral sent by Ms. Lindalou Hose Agcaoili's PCP, Lavera Guise, MD.   Ms. Kanaan was given information about Chronic Care Management services today including:  CCM service includes personalized support from designated clinical staff supervised by her physician, including individualized plan of care and coordination with other care providers 24/7 contact phone numbers for assistance for urgent and routine care needs. Service will only be billed when office clinical staff spend 20 minutes or more in a month to coordinate care. Only one practitioner may furnish and bill the service in a calendar month. The patient may stop CCM services at any time (effective at the end of the month) by phone call to the office staff.   Patient agreed to services and verbal consent obtained.   Follow up plan:   Tatjana Secretary/administrator

## 2021-02-15 ENCOUNTER — Encounter
Admission: RE | Admit: 2021-02-15 | Discharge: 2021-02-15 | Disposition: A | Discharge status: Home / Self Care | Payer: Medicare Other | Source: Ambulatory Visit | Attending: Orthopedic Surgery | Admitting: Orthopedic Surgery

## 2021-02-15 ENCOUNTER — Other Ambulatory Visit: Payer: Self-pay

## 2021-02-15 HISTORY — DX: COVID-19: U07.1

## 2021-02-15 NOTE — Patient Instructions (Signed)
Your procedure is scheduled on:02-25-21 Monday Report to the Registration Desk on the 1st floor of the [facility]-Then proceed to the 2nd floor Surgery Desk in the [place] To find out your arrival time, please call [phone] between 1PM - 3PM on:02-22-21 Friday  REMEMBER: Instructions that are not followed completely may result in serious medical risk, up to and including death; or upon the discretion of your surgeon and anesthesiologist your surgery may need to be rescheduled.  Do not eat food after midnight the night before surgery.  No gum chewing, lozengers or hard candies.  You may however, drink CLEAR liquids up to 2 hours before you are scheduled to arrive for your surgery. Do not drink anything within 2 hours of your scheduled arrival time.  Clear liquids include: - water  - apple juice without pulp - gatorade - black coffee or tea (Do NOT add milk or creamers to the coffee or tea) Do NOT drink anything that is not on this list.  TAKE THESE MEDICATIONS THE MORNING OF SURGERY WITH A SIP OF WATER: -Amlodipine (Norvasc) -Carvedilol (Coreg) -Ezetimibe (Zetia) -Hydralazine (Apresoline) -Bring your Flecainide bottle with you to the hospital  Use your Albuterol Inhaler the Day of surgery and bring Inhaler to the hospital  Call Dr Harlow Mares office today (02-15-21) about when you need to stop your 81 mg Aspirin  One week prior to surgery: Stop Anti-inflammatories (NSAIDS) such as Advil, Aleve, Ibuprofen, Motrin, Naproxen, Naprosyn and Aspirin based products such as Excedrin, Goodys Powder, BC Powder.You may however, continue to take Tylenol if needed for pain up until the day of surgery.  Stop ANY OVER THE COUNTER supplements/vitamins 7 days prior to Surgery  No Alcohol for 24 hours before or after surgery.  No Smoking including e-cigarettes for 24 hours prior to surgery.  No chewable tobacco products for at least 6 hours prior to surgery.  No nicotine patches on the day  of surgery.  Do not use any "recreational" drugs for at least a week prior to your surgery.  Please be advised that the combination of cocaine and anesthesia may have negative outcomes, up to and including death. If you test positive for cocaine, your surgery will be cancelled.  On the morning of surgery brush your teeth with toothpaste and water, you may rinse your mouth with mouthwash if you wish. Do not swallow any toothpaste or mouthwash.  Do not wear jewelry, make-up, hairpins, clips or nail polish.  Do not wear lotions, powders, or perfumes.   Do not shave body from the neck down 48 hours prior to surgery just in case you cut yourself which could leave a site for infection.  Also, freshly shaved skin may become irritated if using the CHG soap.  Contact lenses, hearing aids and dentures may not be worn into surgery.  Do not bring valuables to the hospital. [facility] is not responsible for any missing/lost belongings or valuables.   Use CHG Soap as directed on instruction sheet.  Notify your doctor if there is any change in your medical condition (cold, fever, infection).  Wear comfortable clothing (specific to your surgery type) to the hospital.  After surgery, you can help prevent lung complications by doing breathing exercises.  Take deep breaths and cough every 1-2 hours. Your doctor may order a device called an Incentive Spirometer to help you take deep breaths. When coughing or sneezing, hold a pillow firmly against your incision with both hands. This is called "splinting." Doing this helps  protect your incision. It also decreases belly discomfort.  If you are being admitted to the hospital overnight, leave your suitcase in the car. After surgery it may be brought to your room.  If you are being discharged the day of surgery, you will not be allowed to drive home. You will need a responsible adult (18 years or older) to drive you home and stay with you that night.   If  you are taking public transportation, you will need to have a responsible adult (18 years or older) with you. Please confirm with your physician that it is acceptable to use public transportation.   Please call the [facility]. at [phone] if you have any questions about these instructions.  Surgery Visitation Policy:  Patients undergoing a surgery or procedure may have one family member or support person with them as long as that person is not COVID-19 positive or experiencing its symptoms.  That person may remain in the waiting area during the procedure.  Inpatient Visitation:    Visiting hours are 7 a.m. to 8 p.m. Inpatients will be allowed two visitors daily. The visitors may change each day during the patient's stay. No visitors under the age of 65. Any visitor under the age of 34 must be accompanied by an adult. The visitor must pass COVID-19 screenings, use hand sanitizer when entering and exiting the patient's room and wear a mask at all times, including in the patient's room. Patients must also wear a mask when staff or their visitor are in the room. Masking is required regardless of vaccination status.

## 2021-02-19 ENCOUNTER — Other Ambulatory Visit: Payer: Self-pay | Admitting: Orthopedic Surgery

## 2021-02-19 ENCOUNTER — Encounter: Payer: Self-pay | Admitting: Orthopedic Surgery

## 2021-02-19 NOTE — H&P (Signed)
Whitney [name] MRN:  [id number] DOB/SEX:  1951-05-20/female  CHIEF COMPLAINT:  Painful right Knee  HISTORY: Patient is a 70 y.o. female presented with a history of pain in the right knee. Onset of symptoms was gradual starting several years ago with gradually worsening course since that time. Prior procedures on the knee include none. Patient has been treated conservatively with over-the-counter NSAIDs and activity modification. Patient currently rates pain in the knee at 10 out of 10 with activity. There is pain at night.  PAST MEDICAL HISTORY: Patient Active Problem List   Diagnosis Date Noted   Intestinal metaplasia of gastric cardia 11/07/2020   Mild intermittent asthma without complication 73/53/2992   Shortness of breath 08/08/2020   Vaginal candidiasis 08/08/2020   Atherosclerosis of aorta ([place]) 07/09/2020   Acute diverticulitis 05/13/2020   Fever blister 10/15/2019   Cerumen in auditory canal on examination 10/15/2019   Bilateral hearing loss 10/15/2019   Need for vaccination against Streptococcus pneumoniae using pneumococcal conjugate vaccine 7 10/15/2019   Encounter for screening mammogram for malignant neoplasm of breast 10/15/2019   Colon cancer screening 10/15/2019   Weakness 09/02/2019   Headache disorder 09/02/2019   Stage 3b chronic kidney disease ([place]) 08/10/2019   Elevated erythrocyte sedimentation rate 11/02/2018   Lumbar spondylosis 11/02/2018   PVC's (premature ventricular contractions) 10/18/2018   Primary osteoarthritis of right knee 09/15/2018   Encounter for pre-operative examination 09/15/2018   Obstructive sleep apnea 09/15/2018   Calculus of gallbladder without cholecystitis without obstruction 09/15/2018   Calculus of gallbladder with cholecystitis without biliary obstruction 01/27/2018   Restless leg syndrome 01/27/2018   Generalized abdominal pain 01/11/2018   Cyst of pancreas 01/11/2018   Other specified disorders of kidney and ureter 01/11/2018    Primary insomnia 01/11/2018   Paroxysmal atrial fibrillation ([place]) 11/12/2017   Essential hypertension 10/25/2017   Mixed hyperlipidemia 10/25/2017   Low back pain at multiple sites 10/25/2017   Dysuria 10/25/2017   Encounter for general adult medical examination with abnormal findings 10/25/2017   Vitamin D deficiency 10/25/2017   Near syncope    Symptomatic bradycardia 10/19/2017   Carpal tunnel syndrome 10/12/2017   Primary osteoarthritis of left knee 07/09/2017   History of total knee arthroplasty 07/06/2017   Chest pain 06/29/2017   Postop check 02/25/2017   Impingement syndrome of shoulder region 01/20/2017   Neck pain 01/20/2017   Obesity (BMI 35.0-39.9 without comorbidity) 07/15/2016   Endometrial polyp 07/15/2016   Morbid obesity ([place]) 07/15/2016   Family history of breast cancer in first degree relative 07/15/2016   Family history of ovarian cancer 07/15/2016   Knee pain 07/04/2016   Bilateral leg pain 06/24/2016   Past Medical History:  Diagnosis Date   Abnormal Pap smear of cervix    Pt states she had colposcopy    Anxiety    Aortic atherosclerosis (HCC)    Arthritis    CAD (coronary artery disease)    Chronic kidney disease    s/p LEFT nephrectomy   Chronic lower back pain    COVID    Depression    Diverticulitis    Dyspnea    History of methicillin resistant staphylococcus aureus (MRSA) 12/21/2020   nasal swab pcr   Hx of unilateral nephrectomy 1979   Left Nephrectomy   Hypertension    Lower extremity edema    PAF (paroxysmal atrial fibrillation) (HCC)    Palpitations    PMB (postmenopausal bleeding)    Restless leg syndrome    Sleep apnea  has no cpap   Symptomatic bradycardia    Vitamin D deficiency    Past Surgical History:  Procedure Laterality Date   BIOPSY  10/31/2019   Procedure: BIOPSY;  Surgeon: Rush Landmark Telford Nab., MD;  Location: [place];  Service: Gastroenterology;;   CARDIAC CATHETERIZATION     CHOLECYSTECTOMY      COLONOSCOPY WITH PROPOFOL N/A 06/13/2019   Procedure: COLONOSCOPY WITH PROPOFOL;  Surgeon: [name], MD;  Location: [facility]COPY;  Service: Gastroenterology;  Laterality: N/A;   COLONOSCOPY WITH PROPOFOL N/A 08/12/2019   Procedure: COLONOSCOPY WITH PROPOFOL;  Surgeon: [name], MD;  Location: [facility] ENDOSCOPY;  Service: Gastroenterology;  Laterality: N/A;   COLONOSCOPY WITH PROPOFOL N/A 09/19/2019   Procedure: COLONOSCOPY WITH PROPOFOL;  Surgeon: [name], MD;  Location: [facility] ENDOSCOPY;  Service: Gastroenterology;  Laterality: N/A;   DILATION AND CURETTAGE OF UTERUS     ESOPHAGOGASTRODUODENOSCOPY (EGD) WITH PROPOFOL N/A 06/13/2019   Procedure: ESOPHAGOGASTRODUODENOSCOPY (EGD) WITH PROPOFOL;  Surgeon: [name], MD;  Location: [facility] ENDOSCOPY;  Service: Gastroenterology;  Laterality: N/A;  Pt will go for COVID test on 78-9-38 as she uses public transportation and will be in the area on that day.    ESOPHAGOGASTRODUODENOSCOPY (EGD) WITH PROPOFOL N/A 10/31/2019   Procedure: ESOPHAGOGASTRODUODENOSCOPY (EGD) WITH PROPOFOL;  Surgeon: Rush Landmark Telford Nab., MD;  Location: [place];  Service: Gastroenterology;  Laterality: N/A;   HEMOSTASIS CLIP PLACEMENT  10/31/2019   Procedure: HEMOSTASIS CLIP PLACEMENT;  Surgeon: Irving Copas., MD;  Location: [place];  Service: Gastroenterology;;   HERNIA REPAIR     HYSTEROSCOPY WITH D & C N/A 02/16/2017   Procedure: DILATATION AND CURETTAGE /HYSTEROSCOPY;  Surgeon: Brayton Mars, MD;  Location: [facility];  Service: Gynecology;  Laterality: N/A;   JOINT REPLACEMENT Left    knee   kidney removal Left    KIDNEY SURGERY Left 1979   left kidney removed   LEFT HEART CATH AND CORONARY ANGIOGRAPHY N/A 10/21/2017   Procedure: LEFT HEART CATH AND CORONARY ANGIOGRAPHY;  Surgeon: Teodoro Spray, MD;  Location: [facility];  Service: Cardiovascular;  Laterality: N/A;   POLYPECTOMY  10/31/2019   Procedure: POLYPECTOMY;  Surgeon:  Rush Landmark Telford Nab., MD;  Location: [place];  Service: Gastroenterology;;   TOTAL KNEE ARTHROPLASTY Left 07/06/2017   Procedure: TOTAL KNEE ARTHROPLASTY;  Surgeon: Lovell Sheehan, MD;  Location: [facility];  Service: Orthopedics;  Laterality: Left;   UPPER ESOPHAGEAL ENDOSCOPIC ULTRASOUND (EUS) N/A 10/31/2019   Procedure: UPPER ESOPHAGEAL ENDOSCOPIC ULTRASOUND (EUS);  Surgeon: Irving Copas., MD;  Location: [place];  Service: Gastroenterology;  Laterality: N/A;   WRIST ARTHROSCOPY Left 1970s   XI ROBOTIC ASSISTED VENTRAL HERNIA N/A 12/01/2019   Procedure: XI ROBOTIC ASSISTED VENTRAL HERNIA;  Surgeon: Jules Husbands, MD;  Location: [facility];  Service: General;  Laterality: N/A;     MEDICATIONS:  (Not in a hospital admission)   ALLERGIES:   Allergies  Allergen Reactions   Enalapril Swelling   Lisinopril Swelling   Ace Inhibitors Swelling    And ARB    REVIEW OF SYSTEMS:  Pertinent items are noted in HPI.   FAMILY HISTORY:   Family History  Problem Relation Age of Onset   Ovarian cancer Mother    Hypertension Father    Diabetes Father    Cancer Father    Breast cancer Sister    Diabetes Sister     SOCIAL HISTORY:   Social History   Tobacco Use   Smoking status:  Never   Smokeless tobacco: Never  Substance Use Topics   Alcohol use: Yes    Alcohol/week: 2.0 standard drinks    Types: 2 Standard drinks or equivalent per week    Comment: occasionally     EXAMINATION:  Vital signs in last 24 hours: @VSRANGES @  General appearance: alert, cooperative, and no distress Neck: no JVD and supple, symmetrical, trachea midline Lungs: clear to auscultation bilaterally Heart: regular rate and rhythm, S1, S2 normal, no murmur, click, rub or gallop Abdomen: soft, non-tender; bowel sounds normal; no masses,  no organomegaly Extremities: extremities normal, atraumatic, no cyanosis or edema and Homans sign is negative, no sign of DVT Pulses:  L brachial 2+ R  brachial 2+  L radial 2+ R radial 2+  L inguinal 2+ R inguinal 2+  L popliteal 2+ R popliteal 2+  L posterior tibial 2+ R posterior tibial 2+  L dorsalis pedis 2+ R dorsalis pedis 2+   Skin: Skin color, texture, turgor normal. No rashes or lesions Neurologic: Alert and oriented X 3, normal strength and tone. Normal symmetric reflexes. Normal coordination and gait  Musculoskeletal:  ROM 0-90, Ligaments intact,  Imaging Review Plain radiographs demonstrate severe degenerative joint disease of the right knee. The overall alignment is significant varus. The bone quality appears to be good for age and reported activity level.  Assessment/Plan: Primary osteoarthritis, right knee   The patient history, physical examination and imaging studies are consistent with advanced degenerative joint disease of the right knee. The patient has failed conservative treatment.  The clearance notes were reviewed.  After discussion with the patient it was felt that Total Knee Replacement was indicated. The procedure,  risks, and benefits of total knee arthroplasty were presented and reviewed. The risks including but not limited to aseptic loosening, infection, blood clots, vascular injury, stiffness, patella tracking problems complications among others were discussed. The patient acknowledged the explanation, agreed to proceed with the plan.  Carlynn Spry 02/19/2021, 4:22 PM

## 2021-02-20 DIAGNOSIS — M5416 Radiculopathy, lumbar region: Secondary | ICD-10-CM | POA: Diagnosis not present

## 2021-02-20 DIAGNOSIS — M48062 Spinal stenosis, lumbar region with neurogenic claudication: Secondary | ICD-10-CM | POA: Diagnosis not present

## 2021-02-20 DIAGNOSIS — M4802 Spinal stenosis, cervical region: Secondary | ICD-10-CM | POA: Diagnosis not present

## 2021-02-20 DIAGNOSIS — M5136 Other intervertebral disc degeneration, lumbar region: Secondary | ICD-10-CM | POA: Diagnosis not present

## 2021-02-20 DIAGNOSIS — M5030 Other cervical disc degeneration, unspecified cervical region: Secondary | ICD-10-CM | POA: Diagnosis not present

## 2021-02-20 DIAGNOSIS — M5412 Radiculopathy, cervical region: Secondary | ICD-10-CM | POA: Diagnosis not present

## 2021-02-21 ENCOUNTER — Encounter: Payer: Self-pay | Admitting: Urgent Care

## 2021-02-21 ENCOUNTER — Other Ambulatory Visit: Payer: Medicare Other

## 2021-02-21 ENCOUNTER — Encounter
Admission: RE | Admit: 2021-02-21 | Discharge: 2021-02-21 | Disposition: A | Discharge status: Home / Self Care | Payer: Medicare Other | Source: Ambulatory Visit | Attending: Orthopedic Surgery | Admitting: Orthopedic Surgery

## 2021-02-21 ENCOUNTER — Other Ambulatory Visit: Payer: Self-pay

## 2021-02-21 DIAGNOSIS — Z20822 Contact with and (suspected) exposure to covid-19: Secondary | ICD-10-CM | POA: Diagnosis not present

## 2021-02-21 DIAGNOSIS — Z01812 Encounter for preprocedural laboratory examination: Secondary | ICD-10-CM | POA: Insufficient documentation

## 2021-02-21 LAB — APTT: aPTT: 29 seconds (ref 24–36)

## 2021-02-21 LAB — PROTIME-INR
INR: 1 (ref 0.8–1.2)
Prothrombin Time: 13 seconds (ref 11.4–15.2)

## 2021-02-21 LAB — BASIC METABOLIC PANEL
Anion gap: 6 (ref 5–15)
BUN: 23 mg/dL (ref 8–23)
CO2: 26 mmol/L (ref 22–32)
Calcium: 8.7 mg/dL — ABNORMAL LOW (ref 8.9–10.3)
Chloride: 107 mmol/L (ref 98–111)
Creatinine, Ser: 1.06 mg/dL — ABNORMAL HIGH (ref 0.44–1.00)
GFR, Estimated: 57 mL/min — ABNORMAL LOW (ref >60)
Glucose, Bld: 91 mg/dL (ref 70–99)
Potassium: 4.2 mmol/L (ref 3.5–5.1)
Sodium: 139 mmol/L (ref 135–145)

## 2021-02-21 LAB — URINALYSIS, ROUTINE W REFLEX MICROSCOPIC
Bacteria, UA: NONE SEEN
Bilirubin Urine: NEGATIVE
Glucose, UA: NEGATIVE mg/dL
Hgb urine dipstick: NEGATIVE
Ketones, ur: NEGATIVE mg/dL
Leukocytes,Ua: NEGATIVE
Nitrite: NEGATIVE
Protein, ur: >=300 mg/dL — AB
Specific Gravity, Urine: 1.015 (ref 1.005–1.030)
pH: 5 (ref 5.0–8.0)

## 2021-02-21 LAB — CBC
HCT: 37.5 % (ref 36.0–46.0)
Hemoglobin: 12.2 g/dL (ref 12.0–15.0)
MCH: 30.5 pg (ref 26.0–34.0)
MCHC: 32.5 g/dL (ref 30.0–36.0)
MCV: 93.8 fL (ref 80.0–100.0)
Platelets: 204 10*3/uL (ref 150–400)
RBC: 4 MIL/uL (ref 3.87–5.11)
RDW: 13 % (ref 11.5–15.5)
WBC: 5 10*3/uL (ref 4.0–10.5)
nRBC: 0 % (ref 0.0–0.2)

## 2021-02-21 LAB — TYPE AND SCREEN
ABO/RH(D): B POS
Antibody Screen: NEGATIVE

## 2021-02-21 LAB — SARS CORONAVIRUS 2 (TAT 6-24 HRS): SARS Coronavirus 2: NEGATIVE

## 2021-02-24 MED ORDER — LACTATED RINGERS IV SOLN: INTRAVENOUS | Status: DC

## 2021-02-24 MED ORDER — POVIDONE-IODINE 10 % EX SWAB: 2.0000 "application " | Freq: Once | CUTANEOUS | Status: DC

## 2021-02-24 MED ORDER — VANCOMYCIN HCL IN DEXTROSE 1-5 GM/200ML-% IV SOLN: 1000.0000 mg | INTRAVENOUS | Status: AC

## 2021-02-24 MED ORDER — VANCOMYCIN HCL 1000 MG IV SOLR
1000.0000 mg | Freq: Once | INTRAVENOUS | Status: DC
  Filled 2021-02-24: qty 1000

## 2021-02-24 MED ORDER — CHLORHEXIDINE GLUCONATE 0.12 % MT SOLN: 15.0000 mL | Freq: Once | OROMUCOSAL | Status: AC

## 2021-02-24 MED ORDER — ORAL CARE MOUTH RINSE: 15.0000 mL | Freq: Once | OROMUCOSAL | Status: AC

## 2021-02-24 MED ORDER — FAMOTIDINE 20 MG PO TABS: 20.0000 mg | ORAL_TABLET | Freq: Once | ORAL | Status: AC

## 2021-02-24 MED ORDER — TRANEXAMIC ACID-NACL 1000-0.7 MG/100ML-% IV SOLN
1000.0000 mg | INTRAVENOUS | Status: DC
Start: 2021-02-24 — End: 2021-02-25

## 2021-02-24 MED ORDER — CEFAZOLIN SODIUM-DEXTROSE 2-4 GM/100ML-% IV SOLN
2.0000 g | INTRAVENOUS | Status: AC
  Administered 2021-02-25: 2 g via INTRAVENOUS

## 2021-02-25 ENCOUNTER — Ambulatory Visit: Payer: Medicare Other

## 2021-02-25 ENCOUNTER — Observation Stay
Admission: RE | Admit: 2021-02-25 | Discharge: 2021-02-28 | Disposition: A | Discharge status: Home / Self Care | Payer: Medicare Other | Attending: Orthopedic Surgery | Admitting: Orthopedic Surgery

## 2021-02-25 ENCOUNTER — Other Ambulatory Visit: Payer: Self-pay

## 2021-02-25 ENCOUNTER — Ambulatory Visit: Payer: Medicare Other | Admitting: Urgent Care

## 2021-02-25 ENCOUNTER — Encounter
Admission: RE | Disposition: A | Discharge status: Home / Self Care | Payer: Self-pay | Source: Home / Self Care | Attending: Orthopedic Surgery

## 2021-02-25 ENCOUNTER — Encounter: Payer: Self-pay | Admitting: Orthopedic Surgery

## 2021-02-25 DIAGNOSIS — M1711 Unilateral primary osteoarthritis, right knee: Principal | ICD-10-CM | POA: Insufficient documentation

## 2021-02-25 DIAGNOSIS — I2510 Atherosclerotic heart disease of native coronary artery without angina pectoris: Secondary | ICD-10-CM | POA: Diagnosis not present

## 2021-02-25 DIAGNOSIS — Z96651 Presence of right artificial knee joint: Secondary | ICD-10-CM | POA: Diagnosis not present

## 2021-02-25 DIAGNOSIS — Z8616 Personal history of COVID-19: Secondary | ICD-10-CM | POA: Insufficient documentation

## 2021-02-25 DIAGNOSIS — N189 Chronic kidney disease, unspecified: Secondary | ICD-10-CM | POA: Diagnosis not present

## 2021-02-25 DIAGNOSIS — Z20822 Contact with and (suspected) exposure to covid-19: Secondary | ICD-10-CM | POA: Insufficient documentation

## 2021-02-25 DIAGNOSIS — I480 Paroxysmal atrial fibrillation: Secondary | ICD-10-CM | POA: Diagnosis not present

## 2021-02-25 DIAGNOSIS — E782 Mixed hyperlipidemia: Secondary | ICD-10-CM | POA: Diagnosis not present

## 2021-02-25 DIAGNOSIS — Z471 Aftercare following joint replacement surgery: Secondary | ICD-10-CM | POA: Diagnosis not present

## 2021-02-25 DIAGNOSIS — Z79899 Other long term (current) drug therapy: Secondary | ICD-10-CM | POA: Diagnosis not present

## 2021-02-25 DIAGNOSIS — Z96652 Presence of left artificial knee joint: Secondary | ICD-10-CM | POA: Diagnosis not present

## 2021-02-25 DIAGNOSIS — I129 Hypertensive chronic kidney disease with stage 1 through stage 4 chronic kidney disease, or unspecified chronic kidney disease: Secondary | ICD-10-CM | POA: Diagnosis not present

## 2021-02-25 DIAGNOSIS — G8918 Other acute postprocedural pain: Secondary | ICD-10-CM | POA: Diagnosis not present

## 2021-02-25 DIAGNOSIS — M25561 Pain in right knee: Secondary | ICD-10-CM | POA: Diagnosis not present

## 2021-02-25 DIAGNOSIS — Z419 Encounter for procedure for purposes other than remedying health state, unspecified: Secondary | ICD-10-CM

## 2021-02-25 HISTORY — DX: Atherosclerosis of aorta: I70.0

## 2021-02-25 HISTORY — DX: Bradycardia, unspecified: R00.1

## 2021-02-25 HISTORY — DX: Atherosclerotic heart disease of native coronary artery without angina pectoris: I25.10

## 2021-02-25 HISTORY — DX: Low back pain, unspecified: M54.50

## 2021-02-25 HISTORY — DX: Other chronic pain: G89.29

## 2021-02-25 HISTORY — DX: Paroxysmal atrial fibrillation: I48.0

## 2021-02-25 HISTORY — PX: TOTAL KNEE ARTHROPLASTY: SHX125

## 2021-02-25 LAB — URINE DRUG SCREEN, QUALITATIVE (ARMC ONLY)
Amphetamines, Ur Screen: NOT DETECTED
Barbiturates, Ur Screen: NOT DETECTED
Benzodiazepine, Ur Scrn: NOT DETECTED
Cannabinoid 50 Ng, Ur ~~LOC~~: POSITIVE — AB
Cocaine Metabolite,Ur ~~LOC~~: NOT DETECTED
MDMA (Ecstasy)Ur Screen: NOT DETECTED
Methadone Scn, Ur: NOT DETECTED
Opiate, Ur Screen: NOT DETECTED
Phencyclidine (PCP) Ur S: NOT DETECTED
Tricyclic, Ur Screen: NOT DETECTED

## 2021-02-25 SURGERY — ARTHROPLASTY, KNEE, TOTAL
Anesthesia: Spinal | Site: Knee | Laterality: Right

## 2021-02-25 MED ORDER — FENTANYL CITRATE (PF) 100 MCG/2ML IJ SOLN: 50.0000 ug | INTRAMUSCULAR | Status: DC | PRN

## 2021-02-25 MED ORDER — ALBUTEROL SULFATE (2.5 MG/3ML) 0.083% IN NEBU: 2.5000 mg | INHALATION_SOLUTION | RESPIRATORY_TRACT | Status: DC | PRN

## 2021-02-25 MED ORDER — TRANEXAMIC ACID-NACL 1000-0.7 MG/100ML-% IV SOLN
INTRAVENOUS | Status: AC
  Filled 2021-02-25: qty 100

## 2021-02-25 MED ORDER — ONDANSETRON HCL 4 MG/2ML IJ SOLN
4.0000 mg | Freq: Four times a day (QID) | INTRAMUSCULAR | Status: DC | PRN
  Administered 2021-02-26: 4 mg via INTRAVENOUS
  Filled 2021-02-25: qty 2

## 2021-02-25 MED ORDER — CEFAZOLIN SODIUM-DEXTROSE 2-4 GM/100ML-% IV SOLN
INTRAVENOUS | Status: AC
  Filled 2021-02-25: qty 100

## 2021-02-25 MED ORDER — MORPHINE SULFATE (PF) 2 MG/ML IV SOLN
0.5000 mg | INTRAVENOUS | Status: DC | PRN
  Administered 2021-02-25: 1 mg via INTRAVENOUS
  Filled 2021-02-25: qty 1

## 2021-02-25 MED ORDER — ACETAMINOPHEN 10 MG/ML IV SOLN
INTRAVENOUS | Status: DC | PRN
  Administered 2021-02-25: 1000 mg via INTRAVENOUS

## 2021-02-25 MED ORDER — BUPIVACAINE-EPINEPHRINE (PF) 0.5% -1:200000 IJ SOLN
INTRAMUSCULAR | Status: DC | PRN
  Administered 2021-02-25: 30 mL via PERINEURAL

## 2021-02-25 MED ORDER — DOCUSATE SODIUM 100 MG PO CAPS
100.0000 mg | ORAL_CAPSULE | Freq: Two times a day (BID) | ORAL | Status: DC
  Administered 2021-02-25 – 2021-02-28 (×6): 100 mg via ORAL
  Filled 2021-02-25 (×6): qty 1

## 2021-02-25 MED ORDER — BISACODYL 10 MG RE SUPP
10.0000 mg | Freq: Every day | RECTAL | Status: DC | PRN
  Administered 2021-02-28: 10 mg via RECTAL
  Filled 2021-02-25: qty 1

## 2021-02-25 MED ORDER — ONDANSETRON HCL 4 MG PO TABS: 4.0000 mg | ORAL_TABLET | Freq: Four times a day (QID) | ORAL | Status: DC | PRN

## 2021-02-25 MED ORDER — BUPIVACAINE HCL (PF) 0.5 % IJ SOLN
INTRAMUSCULAR | Status: DC | PRN
  Administered 2021-02-25: 3 mL via INTRATHECAL

## 2021-02-25 MED ORDER — HYDRALAZINE HCL 25 MG PO TABS
25.0000 mg | ORAL_TABLET | Freq: Three times a day (TID) | ORAL | Status: DC
  Administered 2021-02-25 – 2021-02-28 (×9): 25 mg via ORAL
  Filled 2021-02-25 (×9): qty 1

## 2021-02-25 MED ORDER — VANCOMYCIN HCL IN DEXTROSE 1-5 GM/200ML-% IV SOLN
INTRAVENOUS | Status: AC
  Administered 2021-02-25: 1000 mg via INTRAVENOUS
  Filled 2021-02-25: qty 200

## 2021-02-25 MED ORDER — LACTATED RINGERS IV SOLN: INTRAVENOUS | Status: DC

## 2021-02-25 MED ORDER — SURGIRINSE WOUND IRRIGATION SYSTEM - OPTIME: TOPICAL | Status: DC | PRN

## 2021-02-25 MED ORDER — FENTANYL CITRATE (PF) 100 MCG/2ML IJ SOLN
INTRAMUSCULAR | Status: AC
  Administered 2021-02-25: 50 ug via INTRAVENOUS
  Filled 2021-02-25: qty 2

## 2021-02-25 MED ORDER — FENTANYL CITRATE (PF) 100 MCG/2ML IJ SOLN
INTRAMUSCULAR | Status: DC | PRN
  Administered 2021-02-25 (×2): 50 ug via INTRAVENOUS

## 2021-02-25 MED ORDER — FAMOTIDINE 20 MG PO TABS
ORAL_TABLET | ORAL | Status: AC
  Administered 2021-02-25: 20 mg via ORAL
  Filled 2021-02-25: qty 1

## 2021-02-25 MED ORDER — AMLODIPINE BESYLATE 5 MG PO TABS
5.0000 mg | ORAL_TABLET | Freq: Two times a day (BID) | ORAL | Status: DC
  Administered 2021-02-25 – 2021-02-28 (×6): 5 mg via ORAL
  Filled 2021-02-25 (×6): qty 1

## 2021-02-25 MED ORDER — PROPOFOL 500 MG/50ML IV EMUL
INTRAVENOUS | Status: DC | PRN
  Administered 2021-02-25: 75 ug/kg/min via INTRAVENOUS

## 2021-02-25 MED ORDER — ROPIVACAINE HCL 5 MG/ML IJ SOLN
INTRAMUSCULAR | Status: DC | PRN
  Administered 2021-02-25: 30 mL via PERINEURAL

## 2021-02-25 MED ORDER — CHLORHEXIDINE GLUCONATE 0.12 % MT SOLN
OROMUCOSAL | Status: AC
  Administered 2021-02-25: 15 mL via OROMUCOSAL
  Filled 2021-02-25: qty 15

## 2021-02-25 MED ORDER — ONDANSETRON HCL 4 MG/2ML IJ SOLN: 4.0000 mg | Freq: Once | INTRAMUSCULAR | Status: DC | PRN

## 2021-02-25 MED ORDER — MIDAZOLAM HCL 2 MG/2ML IJ SOLN: 1.0000 mg | INTRAMUSCULAR | Status: DC | PRN

## 2021-02-25 MED ORDER — LIDOCAINE HCL (PF) 1 % IJ SOLN
INTRAMUSCULAR | Status: AC
  Filled 2021-02-25: qty 5

## 2021-02-25 MED ORDER — LIDOCAINE HCL (PF) 1 % IJ SOLN
INTRAMUSCULAR | Status: DC | PRN
  Administered 2021-02-25: 5 mL

## 2021-02-25 MED ORDER — DIPHENHYDRAMINE HCL 12.5 MG/5ML PO ELIX: 12.5000 mg | ORAL_SOLUTION | ORAL | Status: DC | PRN

## 2021-02-25 MED ORDER — TRANEXAMIC ACID-NACL 1000-0.7 MG/100ML-% IV SOLN
1000.0000 mg | INTRAVENOUS | Status: AC
  Administered 2021-02-25: 1000 mg via INTRAVENOUS

## 2021-02-25 MED ORDER — ACETAMINOPHEN 325 MG PO TABS: 325.0000 mg | ORAL_TABLET | Freq: Four times a day (QID) | ORAL | Status: DC | PRN

## 2021-02-25 MED ORDER — METOCLOPRAMIDE HCL 5 MG/ML IJ SOLN: 5.0000 mg | Freq: Three times a day (TID) | INTRAMUSCULAR | Status: DC | PRN

## 2021-02-25 MED ORDER — ROPIVACAINE HCL 5 MG/ML IJ SOLN
INTRAMUSCULAR | Status: AC
  Filled 2021-02-25: qty 30

## 2021-02-25 MED ORDER — CARVEDILOL 3.125 MG PO TABS
6.2500 mg | ORAL_TABLET | Freq: Two times a day (BID) | ORAL | Status: DC
  Administered 2021-02-26 – 2021-02-28 (×6): 6.25 mg via ORAL
  Filled 2021-02-25 (×6): qty 2

## 2021-02-25 MED ORDER — HYDROCODONE-ACETAMINOPHEN 7.5-325 MG PO TABS
1.0000 | ORAL_TABLET | ORAL | Status: DC | PRN
  Administered 2021-02-26 (×2): 1 via ORAL
  Administered 2021-02-27: 2 via ORAL
  Filled 2021-02-25: qty 2
  Filled 2021-02-25 (×2): qty 1

## 2021-02-25 MED ORDER — ROPINIROLE HCL 1 MG PO TABS: 0.5000 mg | ORAL_TABLET | Freq: Two times a day (BID) | ORAL | Status: DC | PRN

## 2021-02-25 MED ORDER — ACETAMINOPHEN 10 MG/ML IV SOLN
INTRAVENOUS | Status: AC
  Filled 2021-02-25: qty 100

## 2021-02-25 MED ORDER — FENTANYL CITRATE (PF) 100 MCG/2ML IJ SOLN
INTRAMUSCULAR | Status: AC
  Filled 2021-02-25: qty 2

## 2021-02-25 MED ORDER — MIDAZOLAM HCL 2 MG/2ML IJ SOLN
INTRAMUSCULAR | Status: AC
  Administered 2021-02-25: 1 mg via INTRAVENOUS
  Filled 2021-02-25: qty 2

## 2021-02-25 MED ORDER — GABAPENTIN 100 MG PO CAPS
200.0000 mg | ORAL_CAPSULE | Freq: Two times a day (BID) | ORAL | Status: DC
  Administered 2021-02-25 – 2021-02-28 (×6): 200 mg via ORAL
  Filled 2021-02-25 (×6): qty 2

## 2021-02-25 MED ORDER — KETOROLAC TROMETHAMINE 15 MG/ML IJ SOLN
7.5000 mg | Freq: Four times a day (QID) | INTRAMUSCULAR | Status: AC
  Administered 2021-02-25 – 2021-02-26 (×4): 7.5 mg via INTRAVENOUS
  Filled 2021-02-25 (×3): qty 1

## 2021-02-25 MED ORDER — MENTHOL 3 MG MT LOZG
1.0000 | LOZENGE | OROMUCOSAL | Status: DC | PRN
  Filled 2021-02-25: qty 9

## 2021-02-25 MED ORDER — EZETIMIBE 10 MG PO TABS
10.0000 mg | ORAL_TABLET | Freq: Every day | ORAL | Status: DC
  Administered 2021-02-26 – 2021-02-28 (×3): 10 mg via ORAL
  Filled 2021-02-25 (×4): qty 1

## 2021-02-25 MED ORDER — FENTANYL CITRATE (PF) 100 MCG/2ML IJ SOLN: 25.0000 ug | INTRAMUSCULAR | Status: DC | PRN

## 2021-02-25 MED ORDER — METOCLOPRAMIDE HCL 10 MG PO TABS: 5.0000 mg | ORAL_TABLET | Freq: Three times a day (TID) | ORAL | Status: DC | PRN

## 2021-02-25 MED ORDER — PROPOFOL 10 MG/ML IV BOLUS
INTRAVENOUS | Status: DC | PRN
  Administered 2021-02-25: 50 mg via INTRAVENOUS

## 2021-02-25 MED ORDER — PHENOL 1.4 % MT LIQD
1.0000 | OROMUCOSAL | Status: DC | PRN
  Filled 2021-02-25: qty 177

## 2021-02-25 MED ORDER — CEFAZOLIN SODIUM-DEXTROSE 2-4 GM/100ML-% IV SOLN
2.0000 g | Freq: Four times a day (QID) | INTRAVENOUS | Status: AC
  Administered 2021-02-26 (×2): 2 g via INTRAVENOUS
  Filled 2021-02-25 (×2): qty 100

## 2021-02-25 MED ORDER — NEOMYCIN-POLYMYXIN B GU 40-200000 IR SOLN
Status: DC | PRN
  Administered 2021-02-25: 14 mL

## 2021-02-25 MED ORDER — FLECAINIDE ACETATE 50 MG PO TABS
50.0000 mg | ORAL_TABLET | ORAL | Status: DC | PRN
  Filled 2021-02-25: qty 1

## 2021-02-25 MED ORDER — SODIUM CHLORIDE 0.9 % IR SOLN
Status: DC | PRN
  Administered 2021-02-25: 3000 mL

## 2021-02-25 MED ORDER — ALUM & MAG HYDROXIDE-SIMETH 200-200-20 MG/5ML PO SUSP: 30.0000 mL | ORAL | Status: DC | PRN

## 2021-02-25 MED ORDER — PROPOFOL 10 MG/ML IV BOLUS
INTRAVENOUS | Status: AC
  Filled 2021-02-25: qty 40

## 2021-02-25 MED ORDER — KETOROLAC TROMETHAMINE 15 MG/ML IJ SOLN
INTRAMUSCULAR | Status: AC
  Filled 2021-02-25: qty 1

## 2021-02-25 MED ORDER — SODIUM CHLORIDE 0.9 % IV SOLN
INTRAVENOUS | Status: DC | PRN
  Administered 2021-02-25: 40 ug/min via INTRAVENOUS

## 2021-02-25 MED ORDER — HYDROCODONE-ACETAMINOPHEN 5-325 MG PO TABS
1.0000 | ORAL_TABLET | ORAL | Status: DC | PRN
  Administered 2021-02-25: 1 via ORAL
  Administered 2021-02-26 – 2021-02-27 (×4): 2 via ORAL
  Administered 2021-02-27: 1 via ORAL
  Administered 2021-02-28: 2 via ORAL
  Filled 2021-02-25 (×3): qty 2
  Filled 2021-02-25: qty 1
  Filled 2021-02-25 (×3): qty 2

## 2021-02-25 MED ORDER — HYDROCODONE-ACETAMINOPHEN 5-325 MG PO TABS
ORAL_TABLET | ORAL | Status: AC
  Filled 2021-02-25: qty 1

## 2021-02-25 MED ORDER — ASPIRIN 81 MG PO CHEW
81.0000 mg | CHEWABLE_TABLET | Freq: Two times a day (BID) | ORAL | Status: DC
  Administered 2021-02-25 – 2021-02-28 (×6): 81 mg via ORAL
  Filled 2021-02-25 (×6): qty 1

## 2021-02-25 MED ORDER — SODIUM CHLORIDE 0.9 % IV SOLN
INTRAVENOUS | Status: DC | PRN
  Administered 2021-02-25: 60 mL

## 2021-02-25 MED ORDER — PROPOFOL 10 MG/ML IV BOLUS
INTRAVENOUS | Status: AC
  Filled 2021-02-25: qty 20

## 2021-02-25 MED ORDER — 0.9 % SODIUM CHLORIDE (POUR BTL) OPTIME
TOPICAL | Status: DC | PRN
  Administered 2021-02-25: 1000 mL

## 2021-02-25 SURGICAL SUPPLY — 66 items
BLADE SAGITTAL AGGR TOOTH XLG (BLADE) ×2 IMPLANT
BLADE SAW SAG 25X90X1.19 (BLADE) ×2 IMPLANT
BOWL CEMENT MIX W/ADAPTER (MISCELLANEOUS) ×2 IMPLANT
BRUSH SCRUB EZ  4% CHG (MISCELLANEOUS) ×2
BRUSH SCRUB EZ 4% CHG (MISCELLANEOUS) ×2 IMPLANT
CANISTER SUCT 1200ML W/VALVE (MISCELLANEOUS) ×2 IMPLANT
CEMENT BONE 40GM (Cement) ×4 IMPLANT
CHLORAPREP W/TINT 26 (MISCELLANEOUS) ×4 IMPLANT
COMP PATELLA FENESIS 32 OVAL (Stem) ×2 IMPLANT
COMPONENT PTLLA GENS 32 OVAL (Stem) ×1 IMPLANT
COMPONENT TIBIA RIGHT SZ 4 (Knees) ×1 IMPLANT
COOLER POLAR GLACIER W/PUMP (MISCELLANEOUS) ×2 IMPLANT
CUFF TOURN SGL QUICK 34 (TOURNIQUET CUFF) ×1
CUFF TRNQT CYL 34X4.125X (TOURNIQUET CUFF) ×1 IMPLANT
DRAPE 3/4 80X56 (DRAPES) ×4 IMPLANT
DRAPE INCISE IOBAN 66X60 STRL (DRAPES) ×2 IMPLANT
ELECT REM PT RETURN 9FT ADLT (ELECTROSURGICAL) ×2
ELECTRODE REM PT RTRN 9FT ADLT (ELECTROSURGICAL) ×1 IMPLANT
FEM COMP OXINIUM 5N RT NARROW (Orthopedic Implant) ×2 IMPLANT
FEMORAL COMP OXINIUM 5N RT NRW (Orthopedic Implant) ×1 IMPLANT
GAUZE 4X4 16PLY ~~LOC~~+RFID DBL (SPONGE) ×2 IMPLANT
GAUZE SPONGE 4X4 12PLY STRL (GAUZE/BANDAGES/DRESSINGS) ×2 IMPLANT
GAUZE XEROFORM 1X8 LF (GAUZE/BANDAGES/DRESSINGS) ×4 IMPLANT
GLOVE SURG ENC MOIS LTX SZ8 (GLOVE) ×2 IMPLANT
GLOVE SURG ORTHO LTX SZ8 (GLOVE) ×6 IMPLANT
GLOVE SURG UNDER LTX SZ8 (GLOVE) ×2 IMPLANT
GLOVE SURG UNDER POLY LF SZ8.5 (GLOVE) ×2 IMPLANT
GOWN STRL REUS W/ TWL LRG LVL3 (GOWN DISPOSABLE) ×1 IMPLANT
GOWN STRL REUS W/ TWL XL LVL3 (GOWN DISPOSABLE) ×1 IMPLANT
GOWN STRL REUS W/TWL LRG LVL3 (GOWN DISPOSABLE) ×1
GOWN STRL REUS W/TWL XL LVL3 (GOWN DISPOSABLE) ×1
HOOD PEEL AWAY FLYTE STAYCOOL (MISCELLANEOUS) ×6 IMPLANT
INSERT TIB XLPE 13 SZ 3-4 (Knees) ×2 IMPLANT
IRRIGATION SURGIPHOR STRL (IV SOLUTION) ×2 IMPLANT
KIT TURNOVER KIT A (KITS) ×2 IMPLANT
MANIFOLD NEPTUNE II (INSTRUMENTS) ×4 IMPLANT
MAT ABSORB  FLUID 56X50 GRAY (MISCELLANEOUS) ×1
MAT ABSORB FLUID 56X50 GRAY (MISCELLANEOUS) ×1 IMPLANT
NDL SAFETY ECLIPSE 18X1.5 (NEEDLE) ×1 IMPLANT
NEEDLE HYPO 18GX1.5 SHARP (NEEDLE) ×1
NEEDLE SPNL 20GX3.5 QUINCKE YW (NEEDLE) ×2 IMPLANT
NS IRRIG 1000ML POUR BTL (IV SOLUTION) ×2 IMPLANT
NS IRRIG 500ML POUR BTL (IV SOLUTION) ×2 IMPLANT
PACK TOTAL KNEE (MISCELLANEOUS) ×2 IMPLANT
PAD ABD DERMACEA PRESS 5X9 (GAUZE/BANDAGES/DRESSINGS) ×2 IMPLANT
PAD CAST 4YDX4 CTTN HI CHSV (CAST SUPPLIES) ×1 IMPLANT
PAD DE MAYO PRESSURE PROTECT (MISCELLANEOUS) ×2 IMPLANT
PAD WRAPON POLOR MULTI XL (MISCELLANEOUS) ×1 IMPLANT
PADDING CAST COTTON 4X4 STRL (CAST SUPPLIES) ×1
PULSAVAC PLUS IRRIG FAN TIP (DISPOSABLE) ×2
SPONGE T-LAP 18X18 ~~LOC~~+RFID (SPONGE) ×6 IMPLANT
STAPLER SKIN PROX 35W (STAPLE) ×2 IMPLANT
STOCKINETTE BIAS CUT 6 980064 (GAUZE/BANDAGES/DRESSINGS) ×2 IMPLANT
SUCTION FRAZIER HANDLE 10FR (MISCELLANEOUS) ×1
SUCTION TUBE FRAZIER 10FR DISP (MISCELLANEOUS) ×1 IMPLANT
SUT DVC 2 QUILL PDO  T11 36X36 (SUTURE) ×1
SUT DVC 2 QUILL PDO T11 36X36 (SUTURE) ×1 IMPLANT
SUT VIC AB 2-0 CT1 18 (SUTURE) ×2 IMPLANT
SUT VIC AB 2-0 CT1 27 (SUTURE)
SUT VIC AB 2-0 CT1 TAPERPNT 27 (SUTURE) IMPLANT
SUT VIC AB PLUS 45CM 1-MO-4 (SUTURE) ×2 IMPLANT
SYR 30ML LL (SYRINGE) ×6 IMPLANT
TIBIA RIGHT SZ 4 (Knees) ×2 IMPLANT
TIP FAN IRRIG PULSAVAC PLUS (DISPOSABLE) ×1 IMPLANT
WRAP-ON POLOR PAD MULTI XL (MISCELLANEOUS) ×1
WRAPON POLOR PAD MULTI XL (MISCELLANEOUS) ×1

## 2021-02-25 NOTE — Anesthesia Procedure Notes (Signed)
Spinal  Patient location during procedure: OR Start time: 02/25/2021 3:20 PM End time: 02/25/2021 3:49 PM Reason for block: surgical anesthesia Staffing Performed: anesthesiologist  Anesthesiologist: Molli Barrows, MD Resident/CRNA: Norm Salt, CRNA Preanesthetic Checklist Completed: patient identified, IV checked, site marked, risks and benefits discussed, surgical consent, monitors and equipment checked and pre-op evaluation Spinal Block Patient position: sitting Prep: ChloraPrep Patient monitoring: heart rate, continuous pulse ox, blood pressure and cardiac monitor Approach: midline Location: L4-5 Injection technique: single-shot Needle Needle type: Quincke  Needle gauge: 22 G Needle length: 9 cm Assessment Events: CSF return Additional Notes 3 attempts. SRNA X 1, CRNA X 1, MDA X 1. IV functioning, monitors applied to pt. Expiration date of kit checked and confirmed to be in date. Sterile prep and drape, hand hygiene and sterile gloved used. Pt was positioned and spine was prepped in sterile fashion. Skin was anesthetized with lidocaine. Free flow of clear CSF obtained prior to injecting local anesthetic into CSF x 1 attempt. Spinal needle aspirated freely following injection. Needle was carefully withdrawn, and pt tolerated procedure well. Loss of motor and sensory on exam post injection.

## 2021-02-25 NOTE — Transfer of Care (Signed)
Immediate Anesthesia Transfer of Care Note  Patient: Whitney [name]  Procedure(s) Performed: TOTAL KNEE ARTHROPLASTY (Right: Knee)  Patient Location: PACU  Anesthesia Type:MAC and Spinal  Level of Consciousness: awake and alert   Airway & Oxygen Therapy: Patient Spontanous Breathing and Patient connected to face mask oxygen  Post-op Assessment: Report given to RN and Post -op Vital signs reviewed and stable  Post vital signs: Reviewed and stable  Last Vitals:  Vitals Value Taken Time  BP 120/76 02/25/21 1730  Temp 36.2 C 02/25/21 1730  Pulse 63 02/25/21 1733  Resp 18 02/25/21 1733  SpO2 99 % 02/25/21 1733  Vitals shown include unvalidated device data.  Last Pain:  Vitals:   02/25/21 1334  TempSrc: Temporal  PainSc: 2          Complications: No notable events documented.

## 2021-02-25 NOTE — H&P (Signed)
The patient has been re-examined, and the chart reviewed, and there have been no interval changes to the documented history and physical.  Plan a right total knee today.  Anesthesia is consulted regarding a peripheral nerve block for post-operative pain.  The risks, benefits, and alternatives have been discussed at length, and the patient is willing to proceed.     

## 2021-02-25 NOTE — Plan of Care (Signed)
Patient alert and oriented x 4, arrived from PACU with a R knee arthroplasty. Able to move all extremities. Surgical site is clean, dry and intact. IV pain meds given for pain management. Patient educated on mobility in the bed and pain management. Will continue with the plan of care.  Problem: Education: Goal: Knowledge of General Education information will improve Description: Including pain rating scale, medication(s)/side effects and non-pharmacologic comfort measures Outcome: Progressing   Problem: Health Behavior/Discharge Planning: Goal: Ability to manage health-related needs will improve Outcome: Progressing   Problem: Clinical Measurements: Goal: Ability to maintain clinical measurements within normal limits will improve Outcome: Progressing Goal: Will remain free from infection Outcome: Progressing Goal: Diagnostic test results will improve Outcome: Progressing Goal: Respiratory complications will improve Outcome: Progressing Goal: Cardiovascular complication will be avoided Outcome: Progressing   Problem: Activity: Goal: Risk for activity intolerance will decrease Outcome: Progressing   Problem: Nutrition: Goal: Adequate nutrition will be maintained Outcome: Progressing   Problem: Coping: Goal: Level of anxiety will decrease Outcome: Progressing   Problem: Elimination: Goal: Will not experience complications related to bowel motility Outcome: Progressing Goal: Will not experience complications related to urinary retention Outcome: Progressing   Problem: Pain Managment: Goal: General experience of comfort will improve Outcome: Progressing   Problem: Safety: Goal: Ability to remain free from injury will improve Outcome: Progressing   Problem: Education: Goal: Knowledge of the prescribed therapeutic regimen will improve Outcome: Progressing Goal: Individualized Educational Video(s) Outcome: Progressing   Problem: Skin Integrity: Goal: Risk for impaired  skin integrity will decrease Outcome: Progressing   Problem: Clinical Measurements: Goal: Postoperative complications will be avoided or minimized Outcome: Progressing   Problem: Pain Management: Goal: Pain level will decrease with appropriate interventions Outcome: Progressing   Problem: Skin Integrity: Goal: Will show signs of wound healing Outcome: Progressing

## 2021-02-25 NOTE — Anesthesia Procedure Notes (Signed)
Anesthesia Regional Block: Adductor canal block   Pre-Anesthetic Checklist: , timeout performed,  Correct Patient, Correct Site, Correct Laterality,  Correct Procedure, Correct Position, site marked,  Risks and benefits discussed,  Surgical consent,  Pre-op evaluation,  At surgeon's request and post-op pain management  Laterality: Right and Lower  Prep: chloraprep       Needles:  Injection technique: Single-shot  Needle Type: Stimiplex     Needle Length: 5cm  Needle Gauge: 22     Additional Needles:   Procedures:,,,, ultrasound used (permanent image in chart),,    Narrative:  Start time: 02/25/2021 2:47 PM End time: 02/25/2021 2:49 PM Injection made incrementally with aspirations every 5 mL.  Performed by: Personally  Anesthesiologist: Martha Clan, MD  Additional Notes: Functioning IV was confirmed and monitors were applied.  A 16m 22ga Stimuplex needle was used. Sterile prep and drape,hand hygiene and sterile gloves were used.  Negative aspiration and negative test dose prior to incremental administration of local anesthetic. The patient tolerated the procedure well.

## 2021-02-25 NOTE — Op Note (Signed)
DATE OF SURGERY:  02/25/2021 TIME: 5:23 PM  PATIENT NAME:  Whitney [name]   AGE: 70 y.o.    PRE-OPERATIVE DIAGNOSIS:  M17.11 Unilateral primary osteoarthritis, right knee  POST-OPERATIVE DIAGNOSIS:  Same  PROCEDURE:  Procedure(s): TOTAL KNEE ARTHROPLASTY, RIGHT  SURGEON:  Lovell Sheehan, MD   ASSISTANT:  Carlynn Spry,  PA-C  OPERATIVE IMPLANTS: Tamala [employer]hew, Cruciate Retaining Oxinium Femoral component size  5 narrow, Fixed Bearing Tray size 4, Patella polyethylene 3-peg oval button size 32 mm, with a 13 mm DISH insert.   PREOPERATIVE INDICATIONS:  [name] is an 70 y.o. female who has a diagnosis of M17.11 Unilateral primary osteoarthritis, right knee and elected for a total knee arthroplasty after failing nonoperative treatment, including activity modification, pain medication, physical therapy and injections who has significant impairment of their activities of daily living.  Radiographs have demonstrated tricompartmental osteoarthritis joint space narrowing, osteophytes, subchondral sclerosis and cyst formation.  The risks, benefits, and alternatives were discussed at length including but not limited to the risks of infection, bleeding, nerve or blood vessel injury, knee stiffness, fracture, dislocation, loosening or failure of the hardware and the need for further surgery. Medical risks include but not limited to DVT and pulmonary embolism, myocardial infarction, stroke, pneumonia, respiratory failure and death. I discussed these risks with the patient in my office prior to the date of surgery. They understood these risks and were willing to proceed.  OPERATIVE FINDINGS AND UNIQUE ASPECTS OF THE CASE:  All three compartments with advanced and severe degenerative changes, large osteophytes and an abundance of synovial fluid. Significant deformity was also noted. A decision was made to proceed with total knee arthroplasty.   OPERATIVE DESCRIPTION:  The patient was brought  to the operative room and placed in a supine position after undergoing placement of a general anesthetic. IV antibiotics were given. Patient received tranexamic acid. The lower extremity was prepped and draped in the usual sterile fashion.  A time out was performed to verify the patient's name, date of birth, medical record number, correct site of surgery and correct procedure to be performed. The timeout was also used to confirm the patient received antibiotics and that appropriate instruments, implants and radiographs studies were available in the room.  The leg was elevated and exsanguinated with an Esmarch and the tourniquet was inflated to 250 mmHg.  A midline incision was made over the left knee.. A medial parapatellar arthrotomy was then made and the patella subluxed laterally and the knee was brought into 90 of flexion. Hoffa's fat pad along with the anterior cruciate ligament was resected and the medial joint line was exposed.  Attention was then turned to preparation of the patella. The thickness of the patella was measured with a caliper, the diameter measured with the patella templates.  The patella resection was then made with an oscillating saw using the patella cutting guide.  The 32 mm button fit appropriately.  3 peg holes for the patella component were then drilled.  The extramedullary tibial cutting guide was then placed using the anterior tibial crest and second ray of the foot as a reference.  The tibial cutting guide was adjusted to allow for appropriate posterior slope.  The tibial cutting block was pinned into position. The slotted stylus was used to measure the proximal tibial resection of 9 mm off the high lateral side. Care was taken during the tibial resection to protect the medial and collateral ligaments.  The resected tibial bone was  removed.  The distal femur was resected using the intramedullary cutting guide.  Care was taken to protect the collateral ligaments during distal  femoral resection.  The distal femoral resection was performed with an oscillating saw. The femoral cutting guide was then removed. Extension gap was measured with a 13 mm spacer block and alignment and extension was confirmed using a long alignment rod. The femur was sized to be a 5 narrow. Rotation of the referencing guide was checked with the epicondylar axis and Whitesides line. Then the 4-in-1 cutting jig was then applied to the distal femur. A stylus was used to confirm that the anterior femur would not be notched.   Then the anterior, posterior and chamfer femoral cuts were then made with an oscillating saw.  The knee was distracted and all posterior osteophytes were removed.  The flexion gap was then measured with a flexion spacer block and long alignment rod and was found to be symmetric with the extension gap and perpendicular to mechanical axis of the tibia.  The proximal tibia plateau was then sized with trial trays. The best coverage was achieved with a size 4. This tibial tray was then pinned into position. The proximal tibia was then prepared with the keel punch.  After tibial preparation was completed, all trial components were inserted with polyethylene trials. The knee achieved full extension and flexed to 120 degrees. Ligament were stable to varus and valgus at full extension as well as 30, 60 and 90 degrees of flexion.   The trials were then placed. Knee was taken through a full range of motion and deemed to be stable with the trial components. All trial components were then removed.  The joint was copiously irrigated with pulse lavage.  The final total knee arthroplasty components were then cemented into place. The knee was held in extension while cement was allowed to cure.The knee was taken through a range of motion and the patella tracked well and the knee was again irrigated copiously.  The knee capsule was then injected with Exparel.  The medial arthrotomy was closed with #1 Vicryl  and #2 Quill. The subcutaneous tissue closed with  2-0 vicryl, and skin approximated with staples.  A dry sterile and compressive dressing was applied.  A Polar Care was applied to the operative knee.  The patient was awakened and brought to the PACU in stable and satisfactory condition.  All sharp, lap and instrument counts were correct at the conclusion the case. I spoke with the patient's family in the postop consultation room to let them know the case had been performed without complication and the patient was stable in recovery room.   Total tourniquet time was 49 minutes.

## 2021-02-25 NOTE — Anesthesia Preprocedure Evaluation (Signed)
Anesthesia Evaluation  Patient identified by MRN, date of birth, ID band Patient awake    Reviewed: Allergy & Precautions, NPO status , Patient's Chart, lab work & pertinent test results  History of Anesthesia Complications Negative for: history of anesthetic complications  Airway Mallampati: III       Dental  (+) Edentulous Upper, Partial Lower, Upper Dentures, Dental Advidsory Given   Pulmonary neg shortness of breath, asthma , sleep apnea (not using CPAP) , neg COPD, Not current smoker,           Cardiovascular hypertension, Pt. on medications and Pt. on home beta blockers (-) angina+ CAD  (-) Past MI and (-) Cardiac Stents + dysrhythmias ([place]) Atrial Fibrillation (-) Valvular Problems/Murmurs     Neuro/Psych neg Seizures PSYCHIATRIC DISORDERS Anxiety Depression negative neurological ROS     GI/Hepatic Neg liver ROS, GERD  Medicated and Controlled,  Endo/Other  negative endocrine ROSneg diabetes  Renal/GU Renal InsufficiencyRenal disease (s/p nephrectomy many years ago)     Musculoskeletal   Abdominal   Peds  Hematology   Anesthesia Other Findings Past Medical History: No date: Abnormal Pap smear of cervix     Comment:  Pt states she had colposcopy  No date: Anxiety No date: Aortic atherosclerosis (HCC) No date: Arthritis No date: CAD (coronary artery disease) No date: Chronic kidney disease     Comment:  s/p LEFT nephrectomy No date: Chronic lower back pain No date: COVID No date: Depression No date: Diverticulitis No date: Dyspnea 12/21/2020: History of methicillin resistant staphylococcus aureus  (MRSA)     Comment:  nasal swab pcr 1979: Hx of unilateral nephrectomy     Comment:  Left Nephrectomy No date: Hypertension No date: Lower extremity edema No date: PAF (paroxysmal atrial fibrillation) (HCC) No date: Palpitations No date: PMB (postmenopausal bleeding) No date: Restless leg  syndrome No date: Sleep apnea     Comment:  has no cpap No date: Symptomatic bradycardia No date: Vitamin D deficiency   Reproductive/Obstetrics                             Anesthesia Physical  Anesthesia Plan  ASA: 3  Anesthesia Plan: Spinal   Post-op Pain Management:    Induction: Intravenous  PONV Risk Score and Plan: 3 and Treatment may vary due to age or medical condition, TIVA and Propofol infusion  Airway Management Planned: Natural Airway and Simple Face Mask  Additional Equipment:   Intra-op Plan:   Post-operative Plan:   Informed Consent: I have reviewed the patients History and Physical, chart, labs and discussed the procedure including the risks, benefits and alternatives for the proposed anesthesia with the patient or authorized representative who has indicated his/her understanding and acceptance.       Plan Discussed with:   Anesthesia Plan Comments:        Anesthesia Quick Evaluation

## 2021-02-25 NOTE — Progress Notes (Signed)
UPDATED PATIENT DAUGHTER TAMRA Kuntzman THAT PATIENT IS DOING WELL AND OUT OF THE RECOVERY ROOM IN ROOM 142 ON THE FIRST FLOOR.

## 2021-02-26 ENCOUNTER — Encounter: Payer: Self-pay | Admitting: Orthopedic Surgery

## 2021-02-26 DIAGNOSIS — Z20822 Contact with and (suspected) exposure to covid-19: Secondary | ICD-10-CM | POA: Diagnosis not present

## 2021-02-26 DIAGNOSIS — Z8616 Personal history of COVID-19: Secondary | ICD-10-CM | POA: Diagnosis not present

## 2021-02-26 DIAGNOSIS — N189 Chronic kidney disease, unspecified: Secondary | ICD-10-CM | POA: Diagnosis not present

## 2021-02-26 DIAGNOSIS — I480 Paroxysmal atrial fibrillation: Secondary | ICD-10-CM | POA: Diagnosis not present

## 2021-02-26 DIAGNOSIS — I2510 Atherosclerotic heart disease of native coronary artery without angina pectoris: Secondary | ICD-10-CM | POA: Diagnosis not present

## 2021-02-26 DIAGNOSIS — Z79899 Other long term (current) drug therapy: Secondary | ICD-10-CM | POA: Diagnosis not present

## 2021-02-26 DIAGNOSIS — Z96652 Presence of left artificial knee joint: Secondary | ICD-10-CM | POA: Diagnosis not present

## 2021-02-26 DIAGNOSIS — M1711 Unilateral primary osteoarthritis, right knee: Secondary | ICD-10-CM | POA: Diagnosis not present

## 2021-02-26 DIAGNOSIS — I129 Hypertensive chronic kidney disease with stage 1 through stage 4 chronic kidney disease, or unspecified chronic kidney disease: Secondary | ICD-10-CM | POA: Diagnosis not present

## 2021-02-26 LAB — BASIC METABOLIC PANEL
Anion gap: 6 (ref 5–15)
BUN: 19 mg/dL (ref 8–23)
CO2: 27 mmol/L (ref 22–32)
Calcium: 8.3 mg/dL — ABNORMAL LOW (ref 8.9–10.3)
Chloride: 108 mmol/L (ref 98–111)
Creatinine, Ser: 1.14 mg/dL — ABNORMAL HIGH (ref 0.44–1.00)
GFR, Estimated: 52 mL/min — ABNORMAL LOW (ref >60)
Glucose, Bld: 97 mg/dL (ref 70–99)
Potassium: 4.6 mmol/L (ref 3.5–5.1)
Sodium: 141 mmol/L (ref 135–145)

## 2021-02-26 LAB — CBC
HCT: 35.9 % — ABNORMAL LOW (ref 36.0–46.0)
Hemoglobin: 11.6 g/dL — ABNORMAL LOW (ref 12.0–15.0)
MCH: 30.4 pg (ref 26.0–34.0)
MCHC: 32.3 g/dL (ref 30.0–36.0)
MCV: 94.2 fL (ref 80.0–100.0)
Platelets: 191 10*3/uL (ref 150–400)
RBC: 3.81 MIL/uL — ABNORMAL LOW (ref 3.87–5.11)
RDW: 13.2 % (ref 11.5–15.5)
WBC: 6.9 10*3/uL (ref 4.0–10.5)
nRBC: 0 % (ref 0.0–0.2)

## 2021-02-26 NOTE — Progress Notes (Signed)
Subjective:  Patient reports pain as moderate.  Left sided low back pain today.  Objective:   VITALS:   Vitals:   02/25/21 1901 02/25/21 1942 02/26/21 0737 02/26/21 1153  BP: 140/88 (!) 120/95 130/65 117/66  Pulse: 63 66 85 70  Resp: '19 20 16 16  '$ Temp:  (!) 97.5 F (36.4 C) 98.1 F (36.7 C) 98.3 F (36.8 C)  TempSrc:  Oral    SpO2: 100% 98% 96% 96%  Weight:      Height:        PHYSICAL EXAM:  ABD soft Sensation intact distally Dorsiflexion/Plantar flexion intact Incision: dressing C/D/I  LABS  Results for orders placed or performed during the hospital encounter of 02/25/21 (from the past 24 hour(s))  Urine Drug Screen, Qualitative ([facility] only)     Status: Abnormal   Collection Time: 02/25/21  1:15 PM  Result Value Ref Range   Tricyclic, Ur Screen NONE DETECTED NONE DETECTED   Amphetamines, Ur Screen NONE DETECTED NONE DETECTED   MDMA (Ecstasy)Ur Screen NONE DETECTED NONE DETECTED   Cocaine Metabolite,Ur [place] NONE DETECTED NONE DETECTED   Opiate, Ur Screen NONE DETECTED NONE DETECTED   Phencyclidine (PCP) Ur S NONE DETECTED NONE DETECTED   Cannabinoid 50 Ng, Ur [place] POSITIVE (A) NONE DETECTED   Barbiturates, Ur Screen NONE DETECTED NONE DETECTED   Benzodiazepine, Ur Scrn NONE DETECTED NONE DETECTED   Methadone Scn, Ur NONE DETECTED NONE DETECTED  CBC     Status: Abnormal   Collection Time: 02/26/21  4:32 AM  Result Value Ref Range   WBC 6.9 4.0 - 10.5 K/uL   RBC 3.81 (L) 3.87 - 5.11 MIL/uL   Hemoglobin 11.6 (L) 12.0 - 15.0 g/dL   HCT 35.9 (L) 36.0 - 46.0 %   MCV 94.2 80.0 - 100.0 fL   MCH 30.4 26.0 - 34.0 pg   MCHC 32.3 30.0 - 36.0 g/dL   RDW 13.2 11.5 - 15.5 %   Platelets 191 150 - 400 K/uL   nRBC 0.0 0.0 - 0.2 %  Basic metabolic panel     Status: Abnormal   Collection Time: 02/26/21  4:32 AM  Result Value Ref Range   Sodium 141 135 - 145 mmol/L   Potassium 4.6 3.5 - 5.1 mmol/L   Chloride 108 98 - 111 mmol/L   CO2 27 22 - 32 mmol/L   Glucose, Bld 97 70 -  99 mg/dL   BUN 19 8 - 23 mg/dL   Creatinine, Ser 1.14 (H) 0.44 - 1.00 mg/dL   Calcium 8.3 (L) 8.9 - 10.3 mg/dL   GFR, Estimated 52 (L) >60 mL/min   Anion gap 6 5 - 15    DG Knee Right Port  Result Date: 02/25/2021 CLINICAL DATA:  Follow-up knee arthroplasty. EXAM: PORTABLE RIGHT KNEE - 1-2 VIEW COMPARISON:  None FINDINGS: Two-view show total knee arthroplasty. Components appear well positioned. Small amount of fluid and air in the joint as expected. IMPRESSION: Good appearance following total knee arthroplasty. Electronically Signed   By: Nelson Chimes M.D.   On: 02/25/2021 19:49   [place] OR NERVE BLOCK-IMAGE ONLY [facility])  Result Date: 02/25/2021 There is no interpretation for this exam.  This order is for images obtained during a surgical procedure.  Please See "Surgeries" Tab for more information regarding the procedure.    Assessment/Plan: 1 Day Post-Op   Active Problems:   History of total knee arthroplasty, right   Up with therapy Plan for discharge tomorrow Outpatient PT Dressing  changed   Lovell Sheehan , MD 02/26/2021, 12:37 PM

## 2021-02-26 NOTE — Progress Notes (Signed)
Physical Therapy Treatment Patient Details Name: Whitney [name] MRN: [id number] DOB: 06-Jan-1951 Today's Date: 02/26/2021    History of Present Illness Pt is 70 y.o. female s/p R TKA on 02/25/21.  Pt PMH includes: asthma, sleep apnea, HTN, CAD, Afib, Anxiety, depression, GERD, renal insufficiency, arthritis, and CKD.  Pt has had L TKA performed previously.    PT Comments    Pt received in Semi-Fowler's position and agreeable to therapy. Due to patient difficulty with ambulation earlier in AM session, orthostatic vitals were taken and noted below.  Pt performed well with exercises, however perseverates on bending the knee and with education still has difficulty understanding that the knee is bending with certain exercises.  Pt performed bed mobility with good technique and utilized FWW for assistance to come upright.  Pt doesn not heed advice from therapist for hand placement, instead using both hands on the walker and pulling herself up with the use of a supported walker (held by therapist for safety).  Pt had uncontrolled descent to seated at EOB and noted that she was feeling dizzy.  Pt did become nauseated following the orthostatic tests and was given an emesis bag in which she had foamy sputum coming out.  Pt given wet wash cloths to assist in cooling her down and nursing notified of nausea.  Pt assisted back in bed and recommendation for SNF was present with case manager.  Pt did not agree to recommendation initially, however understood that it was unsafe for the pt to go home with no support 24/7 for getting around her home.  Pt left with all needs met and current d/c recommendations changed to SNF with 24/7 support.      Orthostatic VS for the past 24 hrs:  BP- Lying Pulse- Lying BP- Sitting Pulse- Sitting BP- Standing at 0 minutes Pulse- Standing at 0 minutes  02/26/21 1722 138/72 74 144/79 84 (!) 146/92 86            Follow Up Recommendations  SNF;Supervision/Assistance - 24 hour      Equipment Recommendations  Rolling walker with 5" wheels;3in1 (PT)    Recommendations for Other Services       Precautions / Restrictions Precautions Precautions: Knee Precaution Booklet Issued: Yes (comment) Restrictions Weight Bearing Restrictions: Yes RLE Weight Bearing: Weight bearing as tolerated    Mobility  Bed Mobility Overal bed mobility: Needs Assistance Bed Mobility: Supine to Sit     Supine to sit: Min assist     General bed mobility comments: Pt requires minA with use of handrails and verbal cuing for placement of hands.    Transfers Overall transfer level: Needs assistance Equipment used: Rolling walker (2 wheeled) Transfers: Sit to/from Stand Sit to Stand: Min guard         General transfer comment: Pt able to come into standing with CGA and good functional use of UE's on the walker.  Ambulation/Gait Ambulation/Gait assistance: Min guard   Assistive device: Rolling walker (2 wheeled) Gait Pattern/deviations: Step-to pattern;Decreased step length - left;Decreased stance time - right;Antalgic Gait velocity: decreased   General Gait Details: Pt with heavy use of the walker in order to ambulate around the room.   Stairs             Wheelchair Mobility    Modified Rankin (Stroke Patients Only)       Balance Overall balance assessment: Mild deficits observed, not formally tested  Cognition Arousal/Alertness: Awake/alert Behavior During Therapy: WFL for tasks assessed/performed Overall Cognitive Status: Within Functional Limits for tasks assessed                                        Exercises Total Joint Exercises Ankle Circles/Pumps: AROM;Strengthening;Both;10 reps;Supine Quad Sets: AROM;Strengthening;Both;10 reps;Supine Gluteal Sets: AROM;Strengthening;Both;10 reps;Supine Heel Slides: AROM;Strengthening;Both;10 reps;Supine Hip ABduction/ADduction:  AROM;Strengthening;Both;10 reps;Supine Straight Leg Raises: AROM;Strengthening;Both;15 reps;Supine Long Arc Quad: AROM;Strengthening;Both;10 reps;Seated Marching in Standing: AROM;Strengthening;Both;10 reps;Standing    General Comments        Pertinent Vitals/Pain Pain Assessment: 0-10 Pain Score: 4  Pain Location: R Knee Pain Descriptors / Indicators: Aching Pain Intervention(s): Limited activity within patient's tolerance;Monitored during session;Premedicated before session;Repositioned;Ice applied    Home Living                      Prior Function            PT Goals (current goals can now be found in the care plan section) Acute Rehab PT Goals Patient Stated Goal: To go home. PT Goal Formulation: With patient Time For Goal Achievement: 03/12/21 Potential to Achieve Goals: Good Progress towards PT goals: Not progressing toward goals - comment (Pt unable to ambulate due to orthostatic vitals and reports of nausea/dizziness.)    Frequency    BID      PT Plan      Co-evaluation              AM-PAC PT "6 Clicks" Mobility   Outcome Measure  Help needed turning from your back to your side while in a flat bed without using bedrails?: A Little Help needed moving from lying on your back to sitting on the side of a flat bed without using bedrails?: A Little Help needed moving to and from a bed to a chair (including a wheelchair)?: A Lot Help needed standing up from a chair using your arms (e.g., wheelchair or bedside chair)?: A Little Help needed to walk in hospital room?: A Lot Help needed climbing 3-5 steps with a railing? : Total 6 Click Score: 14    End of Session Equipment Utilized During Treatment: Gait belt Activity Tolerance: Patient tolerated treatment well;Patient limited by pain Patient left: in chair;with call bell/phone within reach;with chair alarm set;with SCD's reapplied Nurse Communication: Mobility status PT Visit Diagnosis:  Unsteadiness on feet (R26.81);Other abnormalities of gait and mobility (R26.89);Muscle weakness (generalized) (M62.81);Difficulty in walking, not elsewhere classified (R26.2);Pain Pain - Right/Left: Right Pain - part of body: Knee     Time: 8309-4076 PT Time Calculation (min) (ACUTE ONLY): 51 min  Charges:  $Gait Training: 23-37 mins $Therapeutic Exercise: 23-37 mins $Therapeutic Activity: 8-22 mins                     Gwenlyn Saran, PT, DPT 02/26/21, 5:31 PM    Christie Nottingham 02/26/2021, 5:31 PM

## 2021-02-26 NOTE — Evaluation (Signed)
Physical Therapy Evaluation Patient Details Name: Whitney [name] MRN: [id number] DOB: 06-07-1951 Today's Date: 02/26/2021   History of Present Illness  Pt is 70 y.o. female s/p R TKA on 02/25/21.  Pt PMH includes: asthma, sleep apnea, HTN, CAD, Afib, Anxiety, depression, GERD, renal insufficiency, arthritis, and CKD.  Pt has had L TKA performed previously.   Clinical Impression  Pt received in Semi-Fowler's position and agreeable to therapy.  Pt familiar with therapy due to having same procedure on the L knee several years ago.  Pt self-reports that she had some issues with her last knee and passed out and required a to be lifted with a hoyer that day after surgery.  Pt started with bed-level exercises and performed with good technique.  Pt then proceeded with bed mobility and was able to perform with supervision and use of the handrails for support.  Pt able to stand with heavy use of UE's on the FWW and able to come into standing.  Pt then proceeded to perform standing marches and weight shifts before proceeding with ambulation.  Pt ambulated 6 feet, however was unable to continue due to feeling dizzy and nauseous.  Pt encouraged to make it to the recliner in which she was able to do.  Pt given wet washcloth due to self-reporting feeling hot.  Pt vitals were taken; SpO2: 96, HR: 65, BP: 97/61.  Pt may benefit from orthostatics being taken.  Nurse was notified of the session.  All needs met and pt left in chair with all needs met.  Pt will benefit from skilled PT intervention to increase independence and safety with basic mobility in preparation for discharge to the venue listed below.         Follow Up Recommendations Home health PT    Equipment Recommendations  Rolling walker with 5" wheels;3in1 (PT)    Recommendations for Other Services       Precautions / Restrictions Precautions Precautions: Knee Precaution Booklet Issued: Yes (comment) Restrictions Weight Bearing Restrictions:  Yes RLE Weight Bearing: Weight bearing as tolerated      Mobility  Bed Mobility Overal bed mobility: Needs Assistance Bed Mobility: Supine to Sit     Supine to sit: Min assist     General bed mobility comments: Pt requires minA with use of handrails and verbal cuing for placement of hands.    Transfers Overall transfer level: Needs assistance Equipment used: Rolling walker (2 wheeled) Transfers: Sit to/from Stand Sit to Stand: Min guard         General transfer comment: Pt able to come into standing with CGA and good functional use of UE's on the walker.  Ambulation/Gait Ambulation/Gait assistance: Min guard Gait Distance (Feet): 6 Feet Assistive device: Rolling walker (2 wheeled) Gait Pattern/deviations: Step-to pattern;Decreased step length - left;Decreased stance time - right;Antalgic Gait velocity: decreased   General Gait Details: Pt with heavy use of the walker in order to ambulate around the room.  Stairs            Wheelchair Mobility    Modified Rankin (Stroke Patients Only)       Balance Overall balance assessment: Mild deficits observed, not formally tested                                           Pertinent Vitals/Pain Pain Assessment: 0-10 Pain Score: 6  Pain Location: R Knee  Pain Descriptors / Indicators: Aching Pain Intervention(s): Limited activity within patient's tolerance;Monitored during session;Premedicated before session;Repositioned;Ice applied    Home Living Family/patient expects to be discharged to:: Private residence Living Arrangements: Alone Available Help at Discharge: Family;Available PRN/intermittently Type of Home: Apartment Home Access: Level entry     Home Layout: One level Home Equipment: Cane - single point;Shower seat      Prior Function Level of Independence: Independent with assistive device(s)         Comments: Modified independent ambulating with SPC.  Has been going to OP PT  d/t R knee issues.     Hand Dominance   Dominant Hand: Right    Extremity/Trunk Assessment   Upper Extremity Assessment Upper Extremity Assessment: Overall WFL for tasks assessed    Lower Extremity Assessment Lower Extremity Assessment: Generalized weakness;RLE deficits/detail RLE Deficits / Details: Weakness d/t surgical intervention       Communication   Communication: No difficulties  Cognition Arousal/Alertness: Awake/alert Behavior During Therapy: WFL for tasks assessed/performed Overall Cognitive Status: Within Functional Limits for tasks assessed                                        General Comments      Exercises Total Joint Exercises Ankle Circles/Pumps: AROM;Strengthening;Both;10 reps;Supine Quad Sets: AROM;Strengthening;Both;10 reps;Supine Gluteal Sets: AROM;Strengthening;Both;10 reps;Supine Heel Slides: AROM;Strengthening;Both;10 reps;Supine Hip ABduction/ADduction: AROM;Strengthening;Both;10 reps;Supine Straight Leg Raises: AROM;Strengthening;Both;15 reps;Supine Other Exercises Other Exercises: Pt educated on roles of PT and the services provided during hospital stay.  Pt also encouraged to continue with performance of the exercises shown during session today for increase mobility, ROM, and reduction in pain.   Assessment/Plan    PT Assessment Patient needs continued PT services  PT Problem List Decreased strength;Decreased range of motion;Decreased activity tolerance;Decreased balance;Decreased mobility;Decreased knowledge of use of DME       PT Treatment Interventions DME instruction;Gait training;Therapeutic activities;Therapeutic exercise;Balance training    PT Goals (Current goals can be found in the Care Plan section)  Acute Rehab PT Goals Patient Stated Goal: To go home. PT Goal Formulation: With patient Time For Goal Achievement: 03/12/21 Potential to Achieve Goals: Good    Frequency BID   Barriers to discharge  Decreased caregiver support Pt does not have anyone to assist her at home 24/7, only intermittently.    Co-evaluation               AM-PAC PT "6 Clicks" Mobility  Outcome Measure Help needed turning from your back to your side while in a flat bed without using bedrails?: A Little Help needed moving from lying on your back to sitting on the side of a flat bed without using bedrails?: A Little Help needed moving to and from a bed to a chair (including a wheelchair)?: A Little Help needed standing up from a chair using your arms (e.g., wheelchair or bedside chair)?: A Little Help needed to walk in hospital room?: A Lot Help needed climbing 3-5 steps with a railing? : A Lot 6 Click Score: 16    End of Session Equipment Utilized During Treatment: Gait belt Activity Tolerance: Patient tolerated treatment well;Patient limited by pain Patient left: in chair;with call bell/phone within reach;with chair alarm set;with SCD's reapplied Nurse Communication: Mobility status PT Visit Diagnosis: Unsteadiness on feet (R26.81);Other abnormalities of gait and mobility (R26.89);Muscle weakness (generalized) (M62.81);Difficulty in walking, not elsewhere classified (R26.2);Pain Pain - Right/Left: Right  Pain - part of body: Knee    Time: 4562-5638 PT Time Calculation (min) (ACUTE ONLY): 56 min   Charges:     PT Treatments $Gait Training: 23-37 mins $Therapeutic Exercise: 23-37 mins        Gwenlyn Saran, PT, DPT 02/26/21, 1:43 PM   Christie Nottingham 02/26/2021, 1:36 PM

## 2021-02-26 NOTE — NC FL2 (Signed)
[place] LEVEL OF CARE SCREENING TOOL     IDENTIFICATION  Patient Name: Whitney [name] Birthdate: 01/04/1951 Sex: female Admission Date (Current Location): 02/25/2021  [place] and [place] Number:  Engineering geologist and Address:  [facility], [address] [place], [place] [address]      Provider Number: [id number]  Attending Physician Name and Address:  Lovell Sheehan, MD  Relative Name and Phone Number:  Mal Misty daughter [phone]    Current Level of Care: Hospital Recommended Level of Care: [place] Prior Approval Number:    Date Approved/Denied:   PASRR Number: [id number]  Discharge Plan: SNF    Current Diagnoses: Patient Active Problem List   Diagnosis Date Noted   History of total knee arthroplasty, right 02/25/2021   Intestinal metaplasia of gastric cardia 11/07/2020   Mild intermittent asthma without complication [id number]   Shortness of breath 08/08/2020   Vaginal candidiasis 08/08/2020   Atherosclerosis of aorta ([place]) 07/09/2020   Acute diverticulitis 05/13/2020   Fever blister 10/15/2019   Cerumen in auditory canal on examination 10/15/2019   Bilateral hearing loss 10/15/2019   Need for vaccination against Streptococcus pneumoniae using pneumococcal conjugate vaccine 7 10/15/2019   Encounter for screening mammogram for malignant neoplasm of breast 10/15/2019   Colon cancer screening 10/15/2019   Weakness 09/02/2019   Headache disorder 09/02/2019   Stage 3b chronic kidney disease ([place]) 08/10/2019   Elevated erythrocyte sedimentation rate 11/02/2018   Lumbar spondylosis 11/02/2018   PVC's (premature ventricular contractions) 10/18/2018   Primary osteoarthritis of right knee 09/15/2018   Encounter for pre-operative examination 09/15/2018   Obstructive sleep apnea 09/15/2018   Calculus of gallbladder without cholecystitis without obstruction 09/15/2018   Calculus of gallbladder with  cholecystitis without biliary obstruction 01/27/2018   Restless leg syndrome 01/27/2018   Generalized abdominal pain 01/11/2018   Cyst of pancreas 01/11/2018   Other specified disorders of kidney and ureter 01/11/2018   Primary insomnia 01/11/2018   Paroxysmal atrial fibrillation ([place]) 11/12/2017   Essential hypertension 10/25/2017   Mixed hyperlipidemia 10/25/2017   Low back pain at multiple sites 10/25/2017   Dysuria 10/25/2017   Encounter for general adult medical examination with abnormal findings 10/25/2017   Vitamin D deficiency 10/25/2017   Near syncope    Symptomatic bradycardia 10/19/2017   Carpal tunnel syndrome 10/12/2017   Primary osteoarthritis of left knee 07/09/2017   History of total knee arthroplasty 07/06/2017   Chest pain 06/29/2017   Postop check 02/25/2017   Impingement syndrome of shoulder region 01/20/2017   Neck pain 01/20/2017   Obesity (BMI 35.0-39.9 without comorbidity) 07/15/2016   Endometrial polyp 07/15/2016   Morbid obesity ([place]) 07/15/2016   Family history of breast cancer in first degree relative 07/15/2016   Family history of ovarian cancer 07/15/2016   Knee pain 07/04/2016   Bilateral leg pain 06/24/2016    Orientation RESPIRATION BLADDER Height & Weight     Self, Time, Situation, Place  Normal Continent, External catheter Weight: 103 kg Height:  '5\' 5"'$  (165.1 cm)  BEHAVIORAL SYMPTOMS/MOOD NEUROLOGICAL BOWEL NUTRITION STATUS      Continent Diet (regular)  AMBULATORY STATUS COMMUNICATION OF NEEDS Skin   Extensive Assist Verbally Surgical wounds                       Personal Care Assistance Level of Assistance  Dressing, Bathing Bathing Assistance: Limited assistance   Dressing Assistance: Limited assistance     Functional  Limitations Info             SPECIAL CARE FACTORS FREQUENCY  PT (By licensed PT)     PT Frequency: 5 times per week              Contractures Contractures Info: Not present    Additional  Factors Info  Code Status, Allergies Code Status Info: Full code Allergies Info: Enalapril, Lisinopril, Ace Inhibitors           Current Medications (02/26/2021):  This is the current hospital active medication list Current Facility-Administered Medications  Medication Dose Route Frequency Provider Last Rate Last Admin   acetaminophen (TYLENOL) tablet 325-650 mg  325-650 mg Oral Q6H PRN Lovell Sheehan, MD       albuterol (PROVENTIL) (2.5 MG/3ML) 0.083% nebulizer solution 2.5 mg  2.5 mg Nebulization Q4H PRN Lovell Sheehan, MD       alum & mag hydroxide-simeth (MAALOX/MYLANTA) 200-200-20 MG/5ML suspension 30 mL  30 mL Oral Q4H PRN Lovell Sheehan, MD       amLODipine (NORVASC) tablet 5 mg  5 mg Oral BID Lovell Sheehan, MD   5 mg at 02/26/21 [id number]   aspirin chewable tablet 81 mg  81 mg Oral BID Lovell Sheehan, MD   81 mg at 02/26/21 [id number]   bisacodyl (DULCOLAX) suppository 10 mg  10 mg Rectal Daily PRN Lovell Sheehan, MD       carvedilol (COREG) tablet 6.25 mg  6.25 mg Oral BID WC Lovell Sheehan, MD   6.25 mg at 02/26/21 [id number]   diphenhydrAMINE (BENADRYL) 12.5 MG/5ML elixir 12.5-25 mg  12.5-25 mg Oral Q4H PRN Lovell Sheehan, MD       docusate sodium (COLACE) capsule 100 mg  100 mg Oral BID Lovell Sheehan, MD   100 mg at 02/26/21 [id number]   ezetimibe (ZETIA) tablet 10 mg  10 mg Oral Daily Lovell Sheehan, MD   10 mg at 02/26/21 0950   flecainide (TAMBOCOR) tablet 50 mg  50 mg Oral PRN Lovell Sheehan, MD       gabapentin (NEURONTIN) capsule 200 mg  200 mg Oral BID Lovell Sheehan, MD   200 mg at 02/26/21 [id number]   hydrALAZINE (APRESOLINE) tablet 25 mg  25 mg Oral TID Lovell Sheehan, MD   25 mg at 02/26/21 0833   HYDROcodone-acetaminophen (NORCO) 7.5-325 MG per tablet 1-2 tablet  1-2 tablet Oral Q4H PRN Lovell Sheehan, MD   1 tablet at 02/26/21 0950   HYDROcodone-acetaminophen (NORCO/VICODIN) 5-325 MG per tablet 1-2 tablet  1-2 tablet Oral Q4H PRN Lovell Sheehan, MD   2 tablet at 02/26/21 1425    lactated ringers infusion   Intravenous Continuous Lovell Sheehan, MD 75 mL/hr at 02/26/21 [id number] Infusion Verify at 02/26/21 [id number]   menthol-cetylpyridinium (CEPACOL) lozenge 3 mg  1 lozenge Oral PRN Lovell Sheehan, MD       Or   phenol (CHLORASEPTIC) mouth spray 1 spray  1 spray Mouth/Throat PRN Lovell Sheehan, MD       metoCLOPramide (REGLAN) tablet 5-10 mg  5-10 mg Oral Q8H PRN Lovell Sheehan, MD       Or   metoCLOPramide (REGLAN) injection 5-10 mg  5-10 mg Intravenous Q8H PRN Lovell Sheehan, MD       morphine 2 MG/ML injection 0.5-1 mg  0.5-1 mg Intravenous Q2H PRN Lovell Sheehan, MD   1 mg at 02/25/21 2026  ondansetron (ZOFRAN) tablet 4 mg  4 mg Oral Q6H PRN Lovell Sheehan, MD       Or   ondansetron [facility]) injection 4 mg  4 mg Intravenous Q6H PRN Lovell Sheehan, MD       rOPINIRole (REQUIP) tablet 0.5 mg  0.5 mg Oral BID PRN Lovell Sheehan, MD         Discharge Medications: Please see discharge summary for a list of discharge medications.  Relevant Imaging Results:  Relevant Lab Results:   Additional Information SS# [id number]  Su Hilt, RN

## 2021-02-26 NOTE — TOC Progression Note (Signed)
Transition of Care [facility]) - Progression Note    Patient Details  Name: Whitney [name] MRN: [id number] Date of Birth: 12/02/1950  Transition of Care [facility]) CM/SW [place], RN Phone Number: 02/26/2021, 3:34 PM  Clinical Narrative:    Met with the patient again, she is not able to walk with PT at this time, she is agreeable to a bed search since she lives at home alone,    Expected Discharge Plan: [place] Barriers to Discharge: Barriers Resolved  Expected Discharge Plan and Services Expected Discharge Plan: [place]   Discharge Planning Services: CM Consult   Living arrangements for the past 2 months: Single Family Home                 DME Arranged: Gilford Rile rolling, 3-N-1 DME Agency: [facility] Date DME Agency Contacted: 02/26/21 Time DME Agency Contacted: [phone] Representative spoke with at DME Agency: [place]: PT [place]: [place] ([place]) Date [place]: 02/26/21 Time [place]: 1301 Representative spoke with at [place]: [place] ([place]) Interventions    Readmission Risk Interventions No flowsheet data found.

## 2021-02-26 NOTE — Anesthesia Postprocedure Evaluation (Signed)
Anesthesia Post Note  Patient: Whitney [name]  Procedure(s) Performed: TOTAL KNEE ARTHROPLASTY (Right: Knee)  Patient location during evaluation: PACU Anesthesia Type: Spinal Level of consciousness: awake and alert Pain management: pain level controlled Vital Signs Assessment: post-procedure vital signs reviewed and stable Respiratory status: spontaneous breathing, nonlabored ventilation, respiratory function stable and patient connected to nasal cannula oxygen Cardiovascular status: blood pressure returned to baseline and stable Postop Assessment: no apparent nausea or vomiting Anesthetic complications: no   No notable events documented.   Last Vitals:  Vitals:   02/25/21 1901 02/25/21 1942  BP: 140/88 (!) 120/95  Pulse: 63 66  Resp: 19 20  Temp:  (!) 36.4 C  SpO2: 100% 98%    Last Pain:  Vitals:   02/25/21 2027  TempSrc:   PainSc: 10-Worst pain ever                 Molli Barrows

## 2021-02-26 NOTE — TOC Initial Note (Signed)
Transition of Care [facility]) - Initial/Assessment Note    Patient Details  Name: Whitney [name] MRN: [id number] Date of Birth: 1950-08-21  Transition of Care [facility]) CM/SW Contact:    Su Hilt, RN Phone Number: 02/26/2021, 1:03 PM  Clinical Narrative:          Met with the patient in the room at the bedside She had a walker several years ago but does not have it any longer, she needs a RW and a 3 in 1, [facility] will bring into the room  She needs HH PT, I contacted Corene Cornea at [facility] and they are going to try to accept the patient, awaiting confirmation She has transportation with her sister She can afford her medications          Expected Discharge Plan: [place] Barriers to Discharge: Barriers Resolved   Patient Goals and CMS Choice Patient states their goals for this hospitalization and ongoing recovery are:: go home      Expected Discharge Plan and Services Expected Discharge Plan: [place]   Discharge Planning Services: CM Consult   Living arrangements for the past 2 months: Single Family Home                 DME Arranged: Gilford Rile rolling, 3-N-1 DME Agency: [facility] Date DME Agency Contacted: 02/26/21 Time DME Agency Contacted: 37 Representative spoke with at DME Agency: Suanne Marker HH Arranged: PT [place] Agency: [place] ([place]) Date HH Agency Contacted: 02/26/21 Time HH Agency Contacted: 1301 Representative spoke with at [place]: Corene Cornea  Prior Living Arrangements/Services Living arrangements for the past 2 months: [place] Lives with:: Self Patient language and need for interpreter reviewed:: Yes Do you feel safe going back to the place where you live?: Yes      Need for Family Participation in Patient Care: Yes (Comment) Care giver support system in place?: Yes (comment) Current home services: DME Criminal Activity/Legal Involvement Pertinent to Current Situation/Hospitalization: Yes - Comment as  needed  Activities of Daily Living Home Assistive Devices/Equipment: Cane (specify quad or straight), Raised toilet seat with rails, Blood pressure cuff ADL Screening (condition at time of admission) Patient's cognitive ability adequate to safely complete daily activities?: Yes Is the patient deaf or have difficulty hearing?: No Does the patient have difficulty seeing, even when wearing glasses/contacts?: No Does the patient have difficulty concentrating, remembering, or making decisions?: No Patient able to express need for assistance with ADLs?: Yes Does the patient have difficulty dressing or bathing?: No Independently performs ADLs?: Yes (appropriate for developmental age) Communication: Independent Dressing (OT): Needs assistance Grooming: Independent Feeding: Independent Bathing: Independent Toileting: Independent with device (comment) In/Out Bed: Independent Walks in Home: Independent with device (comment) Does the patient have difficulty walking or climbing stairs?: Yes Weakness of Legs: Right Weakness of Arms/Hands: Both  Permission Sought/Granted   Permission granted to share information with : Yes, Verbal Permission Granted     Permission granted to share info w AGENCY: [facility]        Emotional Assessment Appearance:: Appears stated age Attitude/Demeanor/Rapport: Engaged Affect (typically observed): Appropriate Orientation: : Oriented to Self, Oriented to Place, Oriented to  Time, Oriented to Situation Alcohol / Substance Use: Not Applicable Psych Involvement: No (comment)  Admission diagnosis:  History of total knee arthroplasty, right [L24.401] Patient Active Problem List   Diagnosis Date Noted   History of total knee arthroplasty, right 02/25/2021   Intestinal metaplasia of gastric cardia 11/07/2020  Mild intermittent asthma without complication 20/91/9802   Shortness of breath 08/08/2020   Vaginal candidiasis 08/08/2020   Atherosclerosis of  aorta (H[facility]) 07/09/2020   Acute diverticulitis 05/13/2020   Fever blister 10/15/2019   Cerumen in auditory canal on examination 10/15/2019   Bilateral hearing loss 10/15/2019   Need for vaccination against Streptococcus pneumoniae using pneumococcal conjugate vaccine 7 10/15/2019   Encounter for screening mammogram for malignant neoplasm of breast 10/15/2019   Colon cancer screening 10/15/2019   Weakness 09/02/2019   Headache disorder 09/02/2019   Stage 3b chronic kidney disease ([place]) 08/10/2019   Elevated erythrocyte sedimentation rate 11/02/2018   Lumbar spondylosis 11/02/2018   PVC's (premature ventricular contractions) 10/18/2018   Primary osteoarthritis of right knee 09/15/2018   Encounter for pre-operative examination 09/15/2018   Obstructive sleep apnea 09/15/2018   Calculus of gallbladder without cholecystitis without obstruction 09/15/2018   Calculus of gallbladder with cholecystitis without biliary obstruction 01/27/2018   Restless leg syndrome 01/27/2018   Generalized abdominal pain 01/11/2018   Cyst of pancreas 01/11/2018   Other specified disorders of kidney and ureter 01/11/2018   Primary insomnia 01/11/2018   Paroxysmal atrial fibrillation ([place]) 11/12/2017   Essential hypertension 10/25/2017   Mixed hyperlipidemia 10/25/2017   Low back pain at multiple sites 10/25/2017   Dysuria 10/25/2017   Encounter for general adult medical examination with abnormal findings 10/25/2017   Vitamin D deficiency 10/25/2017   Near syncope    Symptomatic bradycardia 10/19/2017   Carpal tunnel syndrome 10/12/2017   Primary osteoarthritis of left knee 07/09/2017   History of total knee arthroplasty 07/06/2017   Chest pain 06/29/2017   Postop check 02/25/2017   Impingement syndrome of shoulder region 01/20/2017   Neck pain 01/20/2017   Obesity (BMI 35.0-39.9 without comorbidity) 07/15/2016   Endometrial polyp 07/15/2016   Morbid obesity ([place]) 07/15/2016   Family history of breast  cancer in first degree relative 07/15/2016   Family history of ovarian cancer 07/15/2016   Knee pain 07/04/2016   Bilateral leg pain 06/24/2016   PCP:  Lavera Guise, MD Pharmacy:   [facility] [place], [place] - [address] [place] [address] Phone: [phone] Fax: [place] #[id number] [address], [place] AT [facility] [address] [place] [address] Phone: [phone] Fax: [phone]     Social Determinants of Health (SDOH) Interventions    Readmission Risk Interventions No flowsheet data found.

## 2021-02-27 DIAGNOSIS — I129 Hypertensive chronic kidney disease with stage 1 through stage 4 chronic kidney disease, or unspecified chronic kidney disease: Secondary | ICD-10-CM | POA: Diagnosis not present

## 2021-02-27 DIAGNOSIS — Z96652 Presence of left artificial knee joint: Secondary | ICD-10-CM | POA: Diagnosis not present

## 2021-02-27 DIAGNOSIS — Z20822 Contact with and (suspected) exposure to covid-19: Secondary | ICD-10-CM | POA: Diagnosis not present

## 2021-02-27 DIAGNOSIS — M1711 Unilateral primary osteoarthritis, right knee: Secondary | ICD-10-CM | POA: Diagnosis not present

## 2021-02-27 DIAGNOSIS — I2510 Atherosclerotic heart disease of native coronary artery without angina pectoris: Secondary | ICD-10-CM | POA: Diagnosis not present

## 2021-02-27 DIAGNOSIS — I480 Paroxysmal atrial fibrillation: Secondary | ICD-10-CM | POA: Diagnosis not present

## 2021-02-27 DIAGNOSIS — Z8616 Personal history of COVID-19: Secondary | ICD-10-CM | POA: Diagnosis not present

## 2021-02-27 DIAGNOSIS — Z79899 Other long term (current) drug therapy: Secondary | ICD-10-CM | POA: Diagnosis not present

## 2021-02-27 DIAGNOSIS — N189 Chronic kidney disease, unspecified: Secondary | ICD-10-CM | POA: Diagnosis not present

## 2021-02-27 LAB — CBC
HCT: 34.1 % — ABNORMAL LOW (ref 36.0–46.0)
Hemoglobin: 10.9 g/dL — ABNORMAL LOW (ref 12.0–15.0)
MCH: 30.3 pg (ref 26.0–34.0)
MCHC: 32 g/dL (ref 30.0–36.0)
MCV: 94.7 fL (ref 80.0–100.0)
Platelets: 174 10*3/uL (ref 150–400)
RBC: 3.6 MIL/uL — ABNORMAL LOW (ref 3.87–5.11)
RDW: 13.2 % (ref 11.5–15.5)
WBC: 6.8 10*3/uL (ref 4.0–10.5)
nRBC: 0 % (ref 0.0–0.2)

## 2021-02-27 NOTE — Progress Notes (Signed)
  Subjective:  Patient reports pain as moderate.    Objective:   VITALS:   Vitals:   02/26/21 1153 02/26/21 1629 02/26/21 1925 02/27/21 0015  BP: 117/66 128/66 121/73 113/71  Pulse: 70 63 66 67  Resp: '16 16 20 20  '$ Temp: 98.3 F (36.8 C) 98.2 F (36.8 C) 97.7 F (36.5 C) 97.7 F (36.5 C)  TempSrc:      SpO2: 96% 99% 98% 93%  Weight:      Height:        PHYSICAL EXAM:  Neurologically intact ABD soft Neurovascular intact Sensation intact distally Intact pulses distally Dorsiflexion/Plantar flexion intact Incision: dressing C/D/I No cellulitis present Compartment soft  LABS  Results for orders placed or performed during the hospital encounter of 02/25/21 (from the past 24 hour(s))  CBC     Status: Abnormal   Collection Time: 02/27/21  5:57 AM  Result Value Ref Range   WBC 6.8 4.0 - 10.5 K/uL   RBC 3.60 (L) 3.87 - 5.11 MIL/uL   Hemoglobin 10.9 (L) 12.0 - 15.0 g/dL   HCT 34.1 (L) 36.0 - 46.0 %   MCV 94.7 80.0 - 100.0 fL   MCH 30.3 26.0 - 34.0 pg   MCHC 32.0 30.0 - 36.0 g/dL   RDW 13.2 11.5 - 15.5 %   Platelets 174 150 - 400 K/uL   nRBC 0.0 0.0 - 0.2 %    DG Knee Right Port  Result Date: 02/25/2021 CLINICAL DATA:  Follow-up knee arthroplasty. EXAM: PORTABLE RIGHT KNEE - 1-2 VIEW COMPARISON:  None FINDINGS: Two-view show total knee arthroplasty. Components appear well positioned. Small amount of fluid and air in the joint as expected. IMPRESSION: Good appearance following total knee arthroplasty. Electronically Signed   By: Nelson Chimes M.D.   On: 02/25/2021 19:49   [place] OR NERVE BLOCK-IMAGE ONLY [facility])  Result Date: 02/25/2021 There is no interpretation for this exam.  This order is for images obtained during a surgical procedure.  Please See "Surgeries" Tab for more information regarding the procedure.    Assessment/Plan: 2 Days Post-Op   Active Problems:   History of total knee arthroplasty, right   Advance diet Up with therapy Question discharge home   vs snf, probable SNF Continue bed search, WBAT RLE   Carlynn Spry , PA-C 02/27/2021, 6:35 AM

## 2021-02-27 NOTE — Progress Notes (Signed)
Physical Therapy Treatment Patient Details Name: Whitney [name] MRN: [id number] DOB: 24-Feb-1951 Today's Date: 02/27/2021    History of Present Illness Pt is 70 y.o. female s/p R TKA on 02/25/21.  Pt PMH includes: asthma, sleep apnea, HTN, CAD, Afib, Anxiety, depression, GERD, renal insufficiency, arthritis, and CKD.  Pt has had L TKA performed previously.    PT Comments    Pt alert, motivated for PT. Pt needed to take a phone call for > 10 minutes during beginning of session and treatment paused.  Pt required min-guard for bed mobility for cues, utilizes bed rails w/ HOB elevated. Static sitting pt noted symptoms of lightheadedness and blurriness after approximately 5 minutes of being upright. Vitals are as follows: Pre-treatment (supine) 151/65, 85 bpm, (seated) 96/85, (seated-recheck) 77/53. Then placed in Trendelenburg 140/71, 75 bpm (supine) 117/99, 70 bpm (HOB elevated 45degrees) 96/60. Transfers were not attempted to patient symptomology associated with orthostatic hypotension. Nursing notified and PT continued to review exercises in bed. No further symptoms. Cues to decrease breath holding for muscle contraction are required. Skilled PT intervention is indicated to address deficits in function, mobility, and to return to PLOF as able. Discharge recommendations remain SNF.    Follow Up Recommendations  SNF;Supervision/Assistance - 24 hour     Equipment Recommendations       Recommendations for Other Services       Precautions / Restrictions Precautions Precautions: Knee;Fall Restrictions Weight Bearing Restrictions: Yes RLE Weight Bearing: Weight bearing as tolerated Other Position/Activity Restrictions: Pt is orthostatic with upright sitting (see impression)    Mobility  Bed Mobility Overal bed mobility: Needs Assistance Bed Mobility: Supine to Sit;Sit to Supine     Supine to sit: Min guard;HOB elevated Sit to supine: Min assist   General bed mobility comments: Pt  utilizes bed rails w/ HOB elevated w/o PT assist during supine > sit with cues; min-assist sit > supine to place RLE on bed to high pain    Transfers                 General transfer comment: Not attempted due to symptoms of blurriness, lightheadedness  Ambulation/Gait                 Stairs             Wheelchair Mobility    Modified Rankin (Stroke Patients Only)       Balance Overall balance assessment: Needs assistance Sitting-balance support: Feet supported;Bilateral upper extremity supported Sitting balance-Leahy Scale: Fair Sitting balance - Comments: Pt leans on UEs for support, posterior lean 2/2 to symptoms of orthostasis       Standing balance comment: No standing attempted                            Cognition Arousal/Alertness: Awake/alert Behavior During Therapy: WFL for tasks assessed/performed Overall Cognitive Status: Within Functional Limits for tasks assessed                                        Exercises Total Joint Exercises Ankle Circles/Pumps: AROM;Strengthening;Both;Supine;20 reps;Seated Quad Sets: AROM;Strengthening;Supine;15 reps;Right Heel Slides: AROM;Strengthening;Supine;Right;15 reps Hip ABduction/ADduction: AROM;Strengthening;10 reps;Supine Other Exercises Other Exercises: Pt education on avoid breath holding with activity, pain managment with mobility Other Exercises: Neuromuscular education with exercises for quad activation, relaxation of global musculature to decrease overall anxiety  General Comments        Pertinent Vitals/Pain Pain Assessment: 0-10 Pain Score: 5  Pain Location: R Knee Pain Descriptors / Indicators: Aching Pain Intervention(s): Limited activity within patient's tolerance;Monitored during session;Premedicated before session;Repositioned;Ice applied    Home Living                      Prior Function            PT Goals (current goals can now be  found in the care plan section) Acute Rehab PT Goals Patient Stated Goal: To go home. PT Goal Formulation: With patient Time For Goal Achievement: 03/12/21 Potential to Achieve Goals: Good Progress towards PT goals: Not progressing toward goals - comment (orthostatic hypotension limiting WB mobility)    Frequency    BID      PT Plan Current plan remains appropriate    Co-evaluation              AM-PAC PT "6 Clicks" Mobility   Outcome Measure  Help needed turning from your back to your side while in a flat bed without using bedrails?: A Little Help needed moving from lying on your back to sitting on the side of a flat bed without using bedrails?: A Little Help needed moving to and from a bed to a chair (including a wheelchair)?: A Lot Help needed standing up from a chair using your arms (e.g., wheelchair or bedside chair)?: A Lot Help needed to walk in hospital room?: A Lot Help needed climbing 3-5 steps with a railing? : Total 6 Click Score: 13    End of Session   Activity Tolerance: Treatment limited secondary to medical complications (Comment) (Orthostatic hypotension) Patient left: in bed;with call bell/phone within reach;with bed alarm set;with SCD's reapplied Nurse Communication: Mobility status (Vitals) PT Visit Diagnosis: Unsteadiness on feet (R26.81);Other abnormalities of gait and mobility (R26.89);Muscle weakness (generalized) (M62.81);Difficulty in walking, not elsewhere classified (R26.2);Pain Pain - Right/Left: Right Pain - part of body: Knee     Time: 1002-1100 PT Time Calculation (min) (ACUTE ONLY): 58 min  Charges:                        [employer], SPT

## 2021-02-27 NOTE — Progress Notes (Signed)
Physical Therapy Treatment Patient Details Name: Whitney [name] MRN: [id number] DOB: [birth year] Today's Date: 02/27/2021    History of Present Illness Pt is 70 y.o. female s/p R TKA on 02/25/21.  Pt PMH includes: asthma, sleep apnea, HTN, CAD, Afib, Anxiety, depression, GERD, renal insufficiency, arthritis, and CKD.  Pt has had L TKA performed previously.    PT Comments    Pt agreeable, motivated for treatment. Min-guard for safety, HOB elevated for bed mobility. Min-guard for transfers with RW and cues for technique. Ambulated 40 feet w/ min-guard RW with cues for technique.Primary limitation remains orthostatic hypotension throughout treatment. Pt is unable to maintain upright due to significant changes in BP with symptoms of lightheadedness and most recently heart palpations. Vital measurements taken throughout session with the following:  pre-treatment (supine): 117/79, (long sitting) 135/84, (standing): 81/50, HR 108 pt asymptomatic; sat in chair w/ BP 110/79 still asymptomatic, ambulated short distance & pt noted immediate need to sit down w/ heart palpations with symptomatic lightheadedness. Pt taken back to room via recliner, BP: 143/74 73 bpm. Pt remains motivated but treatment sessions remain limited secondary to orthostasis. Skilled PT intervention is indicated to address deficits in function, mobility, and to return to PLOF as able.  SNF remains d/c recommendation.   Follow Up Recommendations  SNF;Supervision/Assistance - 24 hour     Equipment Recommendations  Rolling walker with 5" wheels;3in1 (PT)    Recommendations for Other Services       Precautions / Restrictions Precautions Precautions: Knee;Fall Precaution Booklet Issued: Yes (comment) Restrictions Weight Bearing Restrictions: Yes RLE Weight Bearing: Weight bearing as tolerated Other Position/Activity Restrictions: orthostatic, monitor vitals closely    Mobility  Bed Mobility Overal bed mobility: Needs  Assistance Bed Mobility: Supine to Sit;Sit to Supine     Supine to sit: Min guard;HOB elevated Sit to supine: Min guard   General bed mobility comments: Pt utilizes bed rails w/ HOB elevated w/o PT assist during supine > sit with cues; min-assist sit > supine to place RLE on bed to high pain    Transfers Overall transfer level: Needs assistance Equipment used: Rolling walker (2 wheeled) Transfers: Sit to/from Stand Sit to Stand: Min guard         General transfer comment: Not attempted due to symptoms of blurriness, lightheadedness  Ambulation/Gait Ambulation/Gait assistance: Min guard Gait Distance (Feet): 40 Feet Assistive device: Rolling walker (2 wheeled) Gait Pattern/deviations: Step-to pattern;Decreased step length - left;Decreased stance time - right;Antalgic Gait velocity: decreased   General Gait Details: Pt became symptomatic w/ immediate need to sit down due to orthostasis   Stairs             Wheelchair Mobility    Modified Rankin (Stroke Patients Only)       Balance Overall balance assessment: Needs assistance Sitting-balance support: Feet supported;No upper extremity supported Sitting balance-Leahy Scale: Good Sitting balance - Comments: Pt leans on UEs for support, posterior lean 2/2 to symptoms of orthostasis     Standing balance-Leahy Scale: Fair Standing balance comment: No standing attempted                            Cognition Arousal/Alertness: Awake/alert Behavior During Therapy: WFL for tasks assessed/performed Overall Cognitive Status: Within Functional Limits for tasks assessed  Exercises Total Joint Exercises Ankle Circles/Pumps: AROM;Strengthening;Both;Supine;20 reps;Seated Quad Sets: AROM;Strengthening;Supine;15 reps;Right Heel Slides: AROM;Strengthening;Supine;Right;15 reps Hip ABduction/ADduction: AROM;Strengthening;10 reps;Supine Other Exercises Other  Exercises: Pt education on avoid breath holding with activity, pain managment with mobility Other Exercises: Neuromuscular education with exercises for quad activation, relaxation of global musculature to decrease overall anxiety    General Comments        Pertinent Vitals/Pain Pain Assessment: 0-10 Pain Score: 5  Pain Location: R Knee Pain Descriptors / Indicators: Aching Pain Intervention(s): Limited activity within patient's tolerance;Monitored during session;Repositioned;Ice applied    Home Living                      Prior Function            PT Goals (current goals can now be found in the care plan section) Acute Rehab PT Goals Patient Stated Goal: To go home. PT Goal Formulation: With patient Time For Goal Achievement: 03/12/21 Potential to Achieve Goals: Good Progress towards PT goals: Progressing toward goals    Frequency    BID      PT Plan Current plan remains appropriate    Co-evaluation              AM-PAC PT "6 Clicks" Mobility   Outcome Measure  Help needed turning from your back to your side while in a flat bed without using bedrails?: A Little Help needed moving from lying on your back to sitting on the side of a flat bed without using bedrails?: A Little Help needed moving to and from a bed to a chair (including a wheelchair)?: A Little Help needed standing up from a chair using your arms (e.g., wheelchair or bedside chair)?: A Little Help needed to walk in hospital room?: A Lot Help needed climbing 3-5 steps with a railing? : A Lot 6 Click Score: 16    End of Session Equipment Utilized During Treatment: Gait belt Activity Tolerance: Treatment limited secondary to medical complications (Comment) (orthostatic hypotension) Patient left: in bed;with call bell/phone within reach;with bed alarm set;with SCD's reapplied Nurse Communication: Mobility status PT Visit Diagnosis: Unsteadiness on feet (R26.81);Other abnormalities of gait  and mobility (R26.89);Muscle weakness (generalized) (M62.81);Difficulty in walking, not elsewhere classified (R26.2);Pain Pain - Right/Left: Right Pain - part of body: Knee     Time: 1432-1530 PT Time Calculation (min) (ACUTE ONLY): 58 min  Charges:  $Therapeutic Exercise: 38-52 mins                     [employer], SPT

## 2021-02-27 NOTE — TOC Progression Note (Signed)
Transition of Care [facility]) - Progression Note    Patient Details  Name: Whitney [name] MRN: [id number] Date of Birth: 09-29-1950  [facility] of [facility]) CM/SW Contact  Su Hilt, RN Phone Number: 02/27/2021, 9:20 AM  Clinical Narrative:    Reached out to facilities requesting a bed offer, awaiting a responce   Expected Discharge Plan: [place] Barriers to Discharge: Barriers Resolved  Expected Discharge Plan and Services Expected Discharge Plan: [place]   Discharge Planning Services: CM Consult   Living arrangements for the past 2 months: Single Family Home                 DME Arranged: Walker rolling, 3-N-1 DME Agency: [employer] Date DME Agency Contacted: 02/26/21 Time DME Agency Contacted: 52 Representative spoke with at DME Agency: [place]: PT [place]: [place] ([place]) Date [place]: 02/26/21 Time [place]: 1301 Representative spoke with at [place]: [place] ([place]) Interventions    Readmission Risk Interventions No flowsheet data found.

## 2021-02-28 DIAGNOSIS — M1711 Unilateral primary osteoarthritis, right knee: Secondary | ICD-10-CM | POA: Diagnosis not present

## 2021-02-28 DIAGNOSIS — Z96651 Presence of right artificial knee joint: Secondary | ICD-10-CM | POA: Diagnosis not present

## 2021-02-28 DIAGNOSIS — Z471 Aftercare following joint replacement surgery: Secondary | ICD-10-CM | POA: Diagnosis not present

## 2021-02-28 DIAGNOSIS — Z8616 Personal history of COVID-19: Secondary | ICD-10-CM | POA: Diagnosis not present

## 2021-02-28 DIAGNOSIS — I10 Essential (primary) hypertension: Secondary | ICD-10-CM | POA: Diagnosis not present

## 2021-02-28 DIAGNOSIS — M6281 Muscle weakness (generalized): Secondary | ICD-10-CM | POA: Diagnosis not present

## 2021-02-28 DIAGNOSIS — Z79899 Other long term (current) drug therapy: Secondary | ICD-10-CM | POA: Diagnosis not present

## 2021-02-28 DIAGNOSIS — M25561 Pain in right knee: Secondary | ICD-10-CM | POA: Diagnosis not present

## 2021-02-28 DIAGNOSIS — R1319 Other dysphagia: Secondary | ICD-10-CM | POA: Diagnosis not present

## 2021-02-28 DIAGNOSIS — K5900 Constipation, unspecified: Secondary | ICD-10-CM | POA: Diagnosis not present

## 2021-02-28 DIAGNOSIS — N189 Chronic kidney disease, unspecified: Secondary | ICD-10-CM | POA: Diagnosis not present

## 2021-02-28 DIAGNOSIS — G2581 Restless legs syndrome: Secondary | ICD-10-CM | POA: Diagnosis not present

## 2021-02-28 DIAGNOSIS — M6259 Muscle wasting and atrophy, not elsewhere classified, multiple sites: Secondary | ICD-10-CM | POA: Diagnosis not present

## 2021-02-28 DIAGNOSIS — I2510 Atherosclerotic heart disease of native coronary artery without angina pectoris: Secondary | ICD-10-CM | POA: Diagnosis not present

## 2021-02-28 DIAGNOSIS — Z96659 Presence of unspecified artificial knee joint: Secondary | ICD-10-CM | POA: Diagnosis not present

## 2021-02-28 DIAGNOSIS — I129 Hypertensive chronic kidney disease with stage 1 through stage 4 chronic kidney disease, or unspecified chronic kidney disease: Secondary | ICD-10-CM | POA: Diagnosis not present

## 2021-02-28 DIAGNOSIS — Z96652 Presence of left artificial knee joint: Secondary | ICD-10-CM | POA: Diagnosis not present

## 2021-02-28 DIAGNOSIS — R5381 Other malaise: Secondary | ICD-10-CM | POA: Diagnosis not present

## 2021-02-28 DIAGNOSIS — I480 Paroxysmal atrial fibrillation: Secondary | ICD-10-CM | POA: Diagnosis not present

## 2021-02-28 DIAGNOSIS — Z20822 Contact with and (suspected) exposure to covid-19: Secondary | ICD-10-CM | POA: Diagnosis not present

## 2021-02-28 DIAGNOSIS — E559 Vitamin D deficiency, unspecified: Secondary | ICD-10-CM | POA: Diagnosis not present

## 2021-02-28 DIAGNOSIS — J4520 Mild intermittent asthma, uncomplicated: Secondary | ICD-10-CM | POA: Diagnosis not present

## 2021-02-28 DIAGNOSIS — R279 Unspecified lack of coordination: Secondary | ICD-10-CM | POA: Diagnosis not present

## 2021-02-28 DIAGNOSIS — R2689 Other abnormalities of gait and mobility: Secondary | ICD-10-CM | POA: Diagnosis not present

## 2021-02-28 LAB — RESP PANEL BY RT-PCR (FLU A&B, COVID) ARPGX2
Influenza A by PCR: NEGATIVE
Influenza B by PCR: NEGATIVE
SARS Coronavirus 2 by RT PCR: NEGATIVE

## 2021-02-28 MED ORDER — DOCUSATE SODIUM 100 MG PO CAPS: 100.0000 mg | ORAL_CAPSULE | Freq: Two times a day (BID) | ORAL | 0 refills | Status: AC

## 2021-02-28 MED ORDER — HYDROCODONE-ACETAMINOPHEN 7.5-325 MG PO TABS: 1.0000 | ORAL_TABLET | ORAL | 0 refills | Status: DC | PRN

## 2021-02-28 MED ORDER — METHOCARBAMOL 500 MG PO TABS: 500.0000 mg | ORAL_TABLET | Freq: Four times a day (QID) | ORAL | 0 refills | Status: DC

## 2021-02-28 MED ORDER — ASPIRIN 81 MG PO CHEW: 81.0000 mg | CHEWABLE_TABLET | Freq: Two times a day (BID) | ORAL | 0 refills | Status: DC

## 2021-02-28 NOTE — Discharge Summary (Signed)
Physician Discharge Summary  Patient ID: Whitney [name] MRN: [id number] DOB/AGE: [birth year] 70 y.o.  Admit date: 02/25/2021 Discharge date: 02/28/2021  Admission Diagnoses:  M17.11 Unilateral primary osteoarthritis, right knee <principal problem not specified>  Discharge Diagnoses:  M17.11 Unilateral primary osteoarthritis, right knee Active Problems:   History of total knee arthroplasty, right   Past Medical History:  Diagnosis Date   Abnormal Pap smear of cervix    Pt states she had colposcopy    Anxiety    Aortic atherosclerosis (HCC)    Arthritis    CAD (coronary artery disease)    Chronic kidney disease    s/p LEFT nephrectomy   Chronic lower back pain    COVID    Depression    Diverticulitis    Dyspnea    History of methicillin resistant staphylococcus aureus (MRSA) 12/21/2020   nasal swab pcr   Hx of unilateral nephrectomy 1979   Left Nephrectomy   Hypertension    Lower extremity edema    PAF (paroxysmal atrial fibrillation) (HCC)    Palpitations    PMB (postmenopausal bleeding)    Restless leg syndrome    Sleep apnea    has no cpap   Symptomatic bradycardia    Vitamin D deficiency     Surgeries: Procedure(s): TOTAL KNEE ARTHROPLASTY on 02/25/2021   Consultants (if any):   Discharged Condition: Improved  Hospital Course: AUDRIELLE REGER is an 70 y.o. female who was admitted 02/25/2021 with a diagnosis of  M17.11 Unilateral primary osteoarthritis, right knee <principal problem not specified> and went to the operating room on 02/25/2021 and underwent the above named procedures.    She was given perioperative antibiotics:  Anti-infectives (From admission, onward)    Start     Dose/Rate Route Frequency Ordered Stop   02/25/21 2200  ceFAZolin (ANCEF) IVPB 2g/100 mL premix        2 g 200 mL/hr over 30 Minutes Intravenous Every 6 hours 02/25/21 1956 02/26/21 0538   02/25/21 1326  ceFAZolin (ANCEF) 2-4 GM/100ML-% IVPB       Note to Pharmacy: Herby Abraham    : cabinet override      02/25/21 1326 02/26/21 0129   02/25/21 0600  ceFAZolin (ANCEF) IVPB 2g/100 mL premix        2 g 200 mL/hr over 30 Minutes Intravenous On call to O.R. 02/24/21 2303 02/25/21 1604   02/25/21 0500  vancomycin (VANCOCIN) IVPB 1000 mg/200 mL premix        1,000 mg 200 mL/hr over 60 Minutes Intravenous 60 min pre-op 02/24/21 2309 02/25/21 1531   02/24/21 2315  vancomycin (VANCOCIN) 1,000 mg in sodium chloride 0.9 % 250 mL IVPB  Status:  Discontinued        1,000 mg 250 mL/hr over 60 Minutes Intravenous  Once 02/24/21 2303 02/24/21 2308     .  She was given sequential compression devices, early ambulation, and aspirin 81 mg BID X 30 days for DVT prophylaxis.  She benefited maximally from the hospital stay and there were no complications.    Recent vital signs:  Vitals:   02/27/21 2034 02/28/21 0533  BP: 129/71 129/69  Pulse: 88 84  Resp: 17 18  Temp: 98.8 F (37.1 C) 99.2 F (37.3 C)  SpO2: 94% 90%    Recent laboratory studies:  Lab Results  Component Value Date   HGB 10.9 (L) 02/27/2021   HGB 11.6 (L) 02/26/2021   HGB 12.2 02/21/2021   Lab Results  Component Value  Date   WBC 6.8 02/27/2021   PLT 174 02/27/2021   Lab Results  Component Value Date   INR 1.0 02/21/2021   Lab Results  Component Value Date   NA 141 02/26/2021   K 4.6 02/26/2021   CL 108 02/26/2021   CO2 27 02/26/2021   BUN 19 02/26/2021   CREATININE 1.14 (H) 02/26/2021   GLUCOSE 97 02/26/2021    Discharge Medications:   Allergies as of 02/28/2021       Reactions   Enalapril Swelling   Lisinopril Swelling   Ace Inhibitors Swelling   And ARB        Medication List     STOP taking these medications    acetaminophen 650 MG CR tablet Commonly known as: TYLENOL   aspirin EC 81 MG tablet Replaced by: aspirin 81 MG chewable tablet       TAKE these medications    albuterol 108 (90 Base) MCG/ACT inhaler Commonly known as: VENTOLIN HFA Inhale 2 puffs into the  lungs every 4 (four) hours as needed for wheezing or shortness of breath.   amLODipine 5 MG tablet Commonly known as: NORVASC Take 1 tablet (5 mg total) by mouth 2 (two) times daily.   aspirin 81 MG chewable tablet Chew 1 tablet (81 mg total) by mouth 2 (two) times daily. Replaces: aspirin EC 81 MG tablet   carvedilol 6.25 MG tablet Commonly known as: COREG Take 1 tablet (6.25 mg total) by mouth 2 (two) times daily with a meal.   diclofenac Sodium 1 % Gel Commonly known as: VOLTAREN Apply 2 g topically daily as needed (Knee pain).   docusate sodium 100 MG capsule Commonly known as: COLACE Take 1 capsule (100 mg total) by mouth 2 (two) times daily.   flecainide 50 MG tablet Commonly known as: TAMBOCOR Take 50 mg by mouth as needed.   gabapentin 100 MG capsule Commonly known as: NEURONTIN Take 200 mg by mouth 2 (two) times daily.   hydrALAZINE 25 MG tablet Commonly known as: APRESOLINE Take 1 tablet (25 mg total) by mouth 3 (three) times daily. For HTN   HYDROcodone-acetaminophen 7.5-325 MG tablet Commonly known as: NORCO Take 1 tablet by mouth every 4 (four) hours as needed for moderate pain (pain score 7-10).   methocarbamol 500 MG tablet Commonly known as: Robaxin Take 1 tablet (500 mg total) by mouth 4 (four) times daily.   Vitamin D 50 MCG (2000 UT) tablet Take 2,000 Units by mouth in the morning and at bedtime.       ASK your doctor about these medications    ezetimibe 10 MG tablet Commonly known as: Zetia Take 1 tablet (10 mg total) by mouth daily.   orlistat 60 MG capsule Commonly known as: ALLI Take 60 mg by mouth 3 (three) times daily with meals.   rOPINIRole 0.5 MG tablet Commonly known as: REQUIP Take 0.5-1 mg by mouth 2 (two) times daily as needed (pain).               Durable Medical Equipment  (From admission, onward)           Start     Ordered   02/26/21 1321  For home use only DME Walker rolling  Once       Question Answer  Comment  Walker: With 5 Inch Wheels   Patient needs a walker to treat with the following condition Weakness      02/26/21 1320  Diagnostic Studies: DG Knee Right Port  Result Date: 02/25/2021 CLINICAL DATA:  Follow-up knee arthroplasty. EXAM: PORTABLE RIGHT KNEE - 1-2 VIEW COMPARISON:  None FINDINGS: Two-view show total knee arthroplasty. Components appear well positioned. Small amount of fluid and air in the joint as expected. IMPRESSION: Good appearance following total knee arthroplasty. Electronically Signed   By: Nelson Chimes M.D.   On: 02/25/2021 19:49   [place] OR NERVE BLOCK-IMAGE ONLY [facility])  Result Date: 02/25/2021 There is no interpretation for this exam.  This order is for images obtained during a surgical procedure.  Please See "Surgeries" Tab for more information regarding the procedure.    Disposition: Discharge disposition: 03-Skilled Nursing Facility            Signed: Carlynn Spry ,PA-C 02/28/2021, 6:58 AM

## 2021-02-28 NOTE — Discharge Instructions (Signed)
Continue weight bear as tolerated on the right lower extremity.    Elevate the right lower extremity whenever possible and continue the polar care while elevating the extremity. Patient may shower. No bath or submerging the wound.    Take aspirin as directed for blood clot prevention.  Continue to work on knee range of motion exercises at home as instructed by physical therapy. Continue to use a walker for assistance with ambulation until cleared by physical therapy.  Call [phone] with any questions, such as fever > 101.5 degrees, drainage from the wound or shortness of breath.  

## 2021-02-28 NOTE — TOC Progression Note (Signed)
Transition of Care [facility]) - Progression Note    Patient Details  Name: Whitney [name] MRN: [id number] Date of Birth: Dec 19, 1950  [facility]) CM/SW [place], RN Phone Number: 02/28/2021, 9:01 AM  Clinical Narrative:     Met with the patient in the room to discuss and review bed offers, she chose [facility], I uploaded the clinical notes to [employer] to obtain insurance approval with the expectation of DC today, ref number [id number], the room number she will go to is E4. [facility] is expecting her to come today if we get ins approval  Expected Discharge Plan: [place] Barriers to Discharge: Barriers Resolved  Expected Discharge Plan and Services Expected Discharge Plan: [place]   Discharge Planning Services: CM Consult   Living arrangements for the past 2 months: Single Family Home Expected Discharge Date: 02/28/21               DME Arranged: [place]ford Rile rolling, 3-N-1 DME Agency: [employer] Date DME Agency Contacted: 02/26/21 Time DME Agency Contacted: [phone] Representative spoke with at DME Agency: [place]: PT [place]: [place] ([place]) Date [place]: 02/26/21 Time [place]: 1301 Representative spoke with at [place]: [place] ([place]) Interventions    Readmission Risk Interventions No flowsheet data found.

## 2021-02-28 NOTE — TOC Progression Note (Signed)
Transition of Care [facility]) - Progression Note    Patient Details  Name: Whitney [name] MRN: [id number] Date of Birth: 24-Apr-1951  Transition [facility]) CM/SW Contact  Su Hilt, RN Phone Number: 02/28/2021, 3:03 PM  Clinical Narrative:     Patient to DC to [facility] E4, [facility] will transport at 5 PM  Expected Discharge Plan: [place] Barriers to Discharge: Barriers Resolved  Expected Discharge Plan and Services Expected Discharge Plan: [place]   Discharge Planning Services: CM Consult   Living arrangements for the past 2 months: Single Family Home Expected Discharge Date: 02/28/21               DME Arranged: Gilford Rile rolling, 3-N-1 DME Agency: [employer] Date DME Agency Contacted: 02/26/21 Time DME Agency Contacted: [phone] Representative spoke with at DME Agency: [place]: PT [place]: [place] ([place]) Date [place]: 02/26/21 Time [place]: 1301 Representative spoke with at [place]: [place] ([place]) Interventions    Readmission Risk Interventions No flowsheet data found.

## 2021-02-28 NOTE — Progress Notes (Addendum)
  Subjective:  Patient reports pain as moderate.  Patient making slow progress.  Lives alone.  Objective:   VITALS:   Vitals:   02/27/21 0843 02/27/21 1632 02/27/21 2034 02/28/21 0533  BP: 113/75 140/73 129/71 129/69  Pulse: 74 89 88 84  Resp: '20 18 17 18  '$ Temp: 98.7 F (37.1 C) 98.6 F (37 C) 98.8 F (37.1 C) 99.2 F (37.3 C)  TempSrc:      SpO2: 97% 97% 94% 90%  Weight:      Height:        PHYSICAL EXAM:  Neurologically intact ABD soft Neurovascular intact Sensation intact distally Intact pulses distally Dorsiflexion/Plantar flexion intact Incision: dressing C/D/I No cellulitis present Compartment soft  LABS  No results found for this or any previous visit (from the past 24 hour(s)).  No results found.  Assessment/Plan: 3 Days Post-Op   Active Problems:   History of total knee arthroplasty, right   Advance diet Up with therapy Discharge SNF vs home with home health PT Continue aspirin BID X 30 days Continue hydrocodone for pain (RX in chart) Follow up in office in 2 weeks for staple removal  Call office to confirm appointment [place] , PA-C 02/28/2021, 6:55 AM

## 2021-02-28 NOTE — Progress Notes (Addendum)
Physical Therapy Treatment Patient Details Name: Whitney [name] MRN: [id number] DOB: 08/02/1951 Today's Date: 02/28/2021    History of Present Illness Pt is 70 y.o. female s/p R TKA on 02/25/21.  Pt PMH includes: asthma, sleep apnea, HTN, CAD, Afib, Anxiety, depression, GERD, renal insufficiency, arthritis, and CKD.  Pt has had L TKA performed previously.    PT Comments    Pt alert, motivated for therapy. Primary limitation remains symptomatic orthostatic hypotension with upright mobility. Vitals as follows: (seated) 150/89, 81 bpm; (standing) 117/61 84 bpm, (standing @ 2 min 30s) 103/51 85 bpm, (supine) 145/75 75 bpm. Due to poor tolerance with upright stance, ambulation were not assessed. Pt does demonstrate continued progress with transfer technique requiring min-guard for safety w/ RW. Pt also is able to scoot, boost forward and laterally for bed mobility w/ bed rails and min-guard for cues. Skilled PT intervention is indicated to address deficits in function, mobility, and to return to PLOF as able.     Follow Up Recommendations  SNF;Supervision/Assistance - 24 hour     Equipment Recommendations  Rolling walker with 5" wheels;3in1 (PT)    Recommendations for Other Services       Precautions / Restrictions Precautions Precautions: Knee;Fall Precaution Booklet Issued: Yes (comment) Restrictions Weight Bearing Restrictions: Yes RLE Weight Bearing: Weight bearing as tolerated Other Position/Activity Restrictions: orthostatic, monitor vitals closely    Mobility  Bed Mobility Overal bed mobility: Needs Assistance Bed Mobility: Supine to Sit;Sit to Supine     Supine to sit: Min guard;HOB elevated Sit to supine: Min guard   General bed mobility comments: min-gaurd for technique    Transfers Overall transfer level: Needs assistance Equipment used: Rolling walker (2 wheeled) Transfers: Sit to/from Stand Sit to Stand: Min guard         General transfer comment:  Lightheadedness, see impression for vitals  Ambulation/Gait                 Stairs             Wheelchair Mobility    Modified Rankin (Stroke Patients Only)       Balance Overall balance assessment: Needs assistance Sitting-balance support: Feet supported;No upper extremity supported Sitting balance-Leahy Scale: Good       Standing balance-Leahy Scale: Fair Standing balance comment: requires BUE, orthostatic, close min-gaurd necessary                            Cognition Arousal/Alertness: Awake/alert Behavior During Therapy: WFL for tasks assessed/performed Overall Cognitive Status: Within Functional Limits for tasks assessed                                        Exercises Total Joint Exercises Ankle Circles/Pumps: AROM;Strengthening;Both;Supine;20 reps;Seated Quad Sets: AROM;Strengthening;Supine;15 reps;Right Towel Squeeze: AROM;Right;15 reps;Supine    General Comments        Pertinent Vitals/Pain Pain Assessment: 0-10 Pain Score: 6  Pain Location: R Knee Pain Descriptors / Indicators: Aching;Sore;Tightness Pain Intervention(s): Limited activity within patient's tolerance;Monitored during session;Repositioned;Ice applied    Home Living                      Prior Function            PT Goals (current goals can now be found in the care plan section) Acute Rehab PT Goals Patient  Stated Goal: To go home. PT Goal Formulation: With patient Time For Goal Achievement: 03/12/21 Potential to Achieve Goals: Good Progress towards PT goals: Progressing toward goals (slowly due to symptomatic orthostatic hypotension)    Frequency    BID      PT Plan Current plan remains appropriate    Co-evaluation              AM-PAC PT "6 Clicks" Mobility   Outcome Measure  Help needed turning from your back to your side while in a flat bed without using bedrails?: A Little Help needed moving from lying on  your back to sitting on the side of a flat bed without using bedrails?: A Little Help needed moving to and from a bed to a chair (including a wheelchair)?: A Little Help needed standing up from a chair using your arms (e.g., wheelchair or bedside chair)?: A Little Help needed to walk in hospital room?: A Lot Help needed climbing 3-5 steps with a railing? : A Lot 6 Click Score: 16    End of Session Equipment Utilized During Treatment: Gait belt Activity Tolerance: Treatment limited secondary to medical complications (Comment) (unable to stand for 3 minutes due to symptomatic orthostatic hypotension) Patient left: in bed;with call bell/phone within reach;with bed alarm set;with SCD's reapplied Nurse Communication: Mobility status PT Visit Diagnosis: Unsteadiness on feet (R26.81);Other abnormalities of gait and mobility (R26.89);Muscle weakness (generalized) (M62.81);Difficulty in walking, not elsewhere classified (R26.2);Pain Pain - Right/Left: Right Pain - part of body: Knee     Time: [id number] PT Time Calculation (min) (ACUTE ONLY): 56 min  Charges:  $Therapeutic Exercise: 23-37 mins $Therapeutic Activity: 23-37 mins                     [employer], SPT

## 2021-03-01 ENCOUNTER — Ambulatory Visit: Payer: Medicare Other | Admitting: Nurse Practitioner

## 2021-03-05 DIAGNOSIS — I10 Essential (primary) hypertension: Secondary | ICD-10-CM | POA: Diagnosis not present

## 2021-03-05 DIAGNOSIS — J4520 Mild intermittent asthma, uncomplicated: Secondary | ICD-10-CM | POA: Diagnosis not present

## 2021-03-05 DIAGNOSIS — Z96659 Presence of unspecified artificial knee joint: Secondary | ICD-10-CM | POA: Diagnosis not present

## 2021-03-05 DIAGNOSIS — I480 Paroxysmal atrial fibrillation: Secondary | ICD-10-CM | POA: Diagnosis not present

## 2021-03-13 ENCOUNTER — Encounter: Payer: Self-pay | Admitting: Orthopedic Surgery

## 2021-03-13 DIAGNOSIS — I10 Essential (primary) hypertension: Secondary | ICD-10-CM | POA: Diagnosis not present

## 2021-03-13 DIAGNOSIS — Z96659 Presence of unspecified artificial knee joint: Secondary | ICD-10-CM | POA: Diagnosis not present

## 2021-03-13 DIAGNOSIS — I480 Paroxysmal atrial fibrillation: Secondary | ICD-10-CM | POA: Diagnosis not present

## 2021-03-14 NOTE — H&P (Addendum)
Whitney [name] MRN:  [id number] DOB/SEX:  May 15, 1951/female  CHIEF COMPLAINT:  Painful right Knee  HISTORY: Patient is a 70 y.o. female presented with a history of pain in the right knee. Onset of symptoms was gradual starting several years ago with gradually worsening course since that time. Prior procedures on the knee include none. Patient has been treated conservatively with over-the-counter NSAIDs and activity modification. Patient currently rates pain in the knee at 10 out of 10 with activity. There is pain at night.  PAST MEDICAL HISTORY: Patient Active Problem List   Diagnosis Date Noted   History of total knee arthroplasty, right 02/25/2021   Intestinal metaplasia of gastric cardia 11/07/2020   Mild intermittent asthma without complication [id number]   Shortness of breath 08/08/2020   Vaginal candidiasis 08/08/2020   Atherosclerosis of aorta ([place]) 07/09/2020   Acute diverticulitis 05/13/2020   Fever blister 10/15/2019   Cerumen in auditory canal on examination 10/15/2019   Bilateral hearing loss 10/15/2019   Need for vaccination against Streptococcus pneumoniae using pneumococcal conjugate vaccine 7 10/15/2019   Encounter for screening mammogram for malignant neoplasm of breast 10/15/2019   Colon cancer screening 10/15/2019   Weakness 09/02/2019   Headache disorder 09/02/2019   Stage 3b chronic kidney disease ([place]) 08/10/2019   Elevated erythrocyte sedimentation rate 11/02/2018   Lumbar spondylosis 11/02/2018   PVC's (premature ventricular contractions) 10/18/2018   Primary osteoarthritis of right knee 09/15/2018   Encounter for pre-operative examination 09/15/2018   Obstructive sleep apnea 09/15/2018   Calculus of gallbladder without cholecystitis without obstruction 09/15/2018   Calculus of gallbladder with cholecystitis without biliary obstruction 01/27/2018   Restless leg syndrome 01/27/2018   Generalized abdominal pain 01/11/2018   Cyst of pancreas 01/11/2018    Other specified disorders of kidney and ureter 01/11/2018   Primary insomnia 01/11/2018   Paroxysmal atrial fibrillation ([place]) 11/12/2017   Essential hypertension 10/25/2017   Mixed hyperlipidemia 10/25/2017   Low back pain at multiple sites 10/25/2017   Dysuria 10/25/2017   Encounter for general adult medical examination with abnormal findings 10/25/2017   Vitamin D deficiency 10/25/2017   Near syncope    Symptomatic bradycardia 10/19/2017   Carpal tunnel syndrome 10/12/2017   Primary osteoarthritis of left knee 07/09/2017   History of total knee arthroplasty 07/06/2017   Chest pain 06/29/2017   Postop check 02/25/2017   Impingement syndrome of shoulder region 01/20/2017   Neck pain 01/20/2017   Obesity (BMI 35.0-39.9 without comorbidity) 07/15/2016   Endometrial polyp 07/15/2016   Morbid obesity ([place]) 07/15/2016   Family history of breast cancer in first degree relative 07/15/2016   Family history of ovarian cancer 07/15/2016   Knee pain 07/04/2016   Bilateral leg pain 06/24/2016   Past Medical History:  Diagnosis Date   Abnormal Pap smear of cervix    Pt states she had colposcopy    Anxiety    Aortic atherosclerosis (HCC)    Arthritis    CAD (coronary artery disease)    Chronic kidney disease    s/p LEFT nephrectomy   Chronic lower back pain    COVID    Depression    Diverticulitis    Dyspnea    History of methicillin resistant staphylococcus aureus (MRSA) 12/21/2020   nasal swab pcr   Hx of unilateral nephrectomy 1979   Left Nephrectomy   Hypertension    Lower extremity edema    PAF (paroxysmal atrial fibrillation) (HCC)    Palpitations    PMB (postmenopausal bleeding)  Restless leg syndrome    Sleep apnea    has no cpap   Symptomatic bradycardia    Vitamin D deficiency    Past Surgical History:  Procedure Laterality Date   BIOPSY  10/31/2019   Procedure: BIOPSY;  Surgeon: Rush Landmark Telford Nab., MD;  Location: [facility] ENDOSCOPY;  Service:  Gastroenterology;;   CARDIAC CATHETERIZATION     CHOLECYSTECTOMY     COLONOSCOPY WITH PROPOFOL N/A 06/13/2019   Procedure: COLONOSCOPY WITH PROPOFOL;  Surgeon: [name], MD;  Location: [facility] ENDOSCOPY;  Service: Gastroenterology;  Laterality: N/A;   COLONOSCOPY WITH PROPOFOL N/A 08/12/2019   Procedure: COLONOSCOPY WITH PROPOFOL;  Surgeon: [name], MD;  Location: [facility] ENDOSCOPY;  Service: Gastroenterology;  Laterality: N/A;   COLONOSCOPY WITH PROPOFOL N/A 09/19/2019   Procedure: COLONOSCOPY WITH PROPOFOL;  Surgeon: [name], MD;  Location: [facility] ENDOSCOPY;  Service: Gastroenterology;  Laterality: N/A;   DILATION AND CURETTAGE OF UTERUS     ESOPHAGOGASTRODUODENOSCOPY (EGD) WITH PROPOFOL N/A 06/13/2019   Procedure: ESOPHAGOGASTRODUODENOSCOPY (EGD) WITH PROPOFOL;  Surgeon: [name], MD;  Location: [facility] ENDOSCOPY;  Service: Gastroenterology;  Laterality: N/A;  Pt will go for COVID test on 123456 as she uses public transportation and will be in the area on that day.    ESOPHAGOGASTRODUODENOSCOPY (EGD) WITH PROPOFOL N/A 10/31/2019   Procedure: ESOPHAGOGASTRODUODENOSCOPY (EGD) WITH PROPOFOL;  Surgeon: Rush Landmark Telford Nab., MD;  Location: [place];  Service: Gastroenterology;  Laterality: N/A;   HEMOSTASIS CLIP PLACEMENT  10/31/2019   Procedure: HEMOSTASIS CLIP PLACEMENT;  Surgeon: Irving Copas., MD;  Location: [place];  Service: Gastroenterology;;   HERNIA REPAIR     HYSTEROSCOPY WITH D & C N/A 02/16/2017   Procedure: DILATATION AND CURETTAGE /HYSTEROSCOPY;  Surgeon: Brayton Mars, MD;  Location: [facility] ORS;  Service: Gynecology;  Laterality: N/A;   JOINT REPLACEMENT Left    knee   kidney removal Left    KIDNEY SURGERY Left 1979   left kidney removed   LEFT HEART CATH AND CORONARY ANGIOGRAPHY N/A 10/21/2017   Procedure: LEFT HEART CATH AND CORONARY ANGIOGRAPHY;  Surgeon: Teodoro Spray, MD;  Location: [place] CV LAB;  Service: Cardiovascular;  Laterality: N/A;    POLYPECTOMY  10/31/2019   Procedure: POLYPECTOMY;  Surgeon: Rush Landmark Telford Nab., MD;  Location: [place];  Service: Gastroenterology;;   TOTAL KNEE ARTHROPLASTY Left 07/06/2017   Procedure: TOTAL KNEE ARTHROPLASTY;  Surgeon: Lovell Sheehan, MD;  Location: [facility] ORS;  Service: Orthopedics;  Laterality: Left;   TOTAL KNEE ARTHROPLASTY Right 02/25/2021   Procedure: TOTAL KNEE ARTHROPLASTY;  Surgeon: Lovell Sheehan, MD;  Location: [facility] ORS;  Service: Orthopedics;  Laterality: Right;   UPPER ESOPHAGEAL ENDOSCOPIC ULTRASOUND (EUS) N/A 10/31/2019   Procedure: UPPER ESOPHAGEAL ENDOSCOPIC ULTRASOUND (EUS);  Surgeon: Irving Copas., MD;  Location: [place];  Service: Gastroenterology;  Laterality: N/A;   WRIST ARTHROSCOPY Left 1970s   XI ROBOTIC ASSISTED VENTRAL HERNIA N/A 12/01/2019   Procedure: XI ROBOTIC ASSISTED VENTRAL HERNIA;  Surgeon: Jules Husbands, MD;  Location: [facility] ORS;  Service: General;  Laterality: N/A;     MEDICATIONS:   No medications prior to admission.    ALLERGIES:   Allergies  Allergen Reactions   Enalapril Swelling   Lisinopril Swelling   Ace Inhibitors Swelling    And ARB    REVIEW OF SYSTEMS:  Pertinent items are noted in HPI.   FAMILY HISTORY:   Family History  Problem Relation Age of Onset   Ovarian cancer Mother  Hypertension Father    Diabetes Father    Cancer Father    Breast cancer Sister    Diabetes Sister     SOCIAL HISTORY:   Social History   Tobacco Use   Smoking status: Never   Smokeless tobacco: Never  Substance Use Topics   Alcohol use: Yes    Alcohol/week: 2.0 standard drinks    Types: 2 Standard drinks or equivalent per week    Comment: occasionally     EXAMINATION:  Vital signs in last 24 hours:    General appearance: alert, cooperative, and no distress Neck: no JVD and supple, symmetrical, trachea midline Lungs: clear to auscultation bilaterally Heart: regular rate and rhythm, S1, S2 normal, no murmur,  click, rub or gallop Abdomen: soft, non-tender; bowel sounds normal; no masses,  no organomegaly Extremities: extremities normal, atraumatic, no cyanosis or edema and Homans sign is negative, no sign of DVT Pulses: 2+ and symmetric Skin: Skin color, texture, turgor normal. No rashes or lesions Neurologic: Alert and oriented X 3, normal strength and tone. Normal symmetric reflexes. Normal coordination and gait  Musculoskeletal:  ROM 0-100, Ligaments intact,  Imaging Review Plain radiographs demonstrate severe degenerative joint disease of the right knee. The overall alignment is significant varus. The bone quality appears to be good for age and reported activity level.  Assessment/Plan: Primary osteoarthritis, right knee   The patient history, physical examination and imaging studies are consistent with advanced degenerative joint disease of the right knee. The patient has failed conservative treatment.  The clearance notes were reviewed.  After discussion with the patient it was felt that Total Knee Replacement was indicated. The procedure,  risks, and benefits of total knee arthroplasty were presented and reviewed. The risks including but not limited to aseptic loosening, infection, blood clots, vascular injury, stiffness, patella tracking problems complications among others were discussed. The patient acknowledged the explanation, agreed to proceed with the plan.  Carlynn Spry 03/14/2021, 2:24 PM

## 2021-03-19 ENCOUNTER — Telehealth: Payer: Self-pay | Admitting: Pharmacist

## 2021-03-19 ENCOUNTER — Encounter: Payer: Self-pay | Admitting: Physical Therapy

## 2021-03-19 ENCOUNTER — Ambulatory Visit: Payer: Medicare Other | Attending: Orthopedic Surgery | Admitting: Physical Therapy

## 2021-03-19 VITALS — BP 143/76 | HR 81

## 2021-03-19 DIAGNOSIS — G8929 Other chronic pain: Secondary | ICD-10-CM | POA: Insufficient documentation

## 2021-03-19 DIAGNOSIS — R262 Difficulty in walking, not elsewhere classified: Secondary | ICD-10-CM

## 2021-03-19 DIAGNOSIS — M25661 Stiffness of right knee, not elsewhere classified: Secondary | ICD-10-CM | POA: Diagnosis not present

## 2021-03-19 DIAGNOSIS — M25552 Pain in left hip: Secondary | ICD-10-CM | POA: Insufficient documentation

## 2021-03-19 DIAGNOSIS — M6281 Muscle weakness (generalized): Secondary | ICD-10-CM | POA: Diagnosis not present

## 2021-03-19 DIAGNOSIS — M25561 Pain in right knee: Secondary | ICD-10-CM | POA: Diagnosis not present

## 2021-03-19 DIAGNOSIS — M1711 Unilateral primary osteoarthritis, right knee: Secondary | ICD-10-CM | POA: Diagnosis not present

## 2021-03-19 NOTE — Progress Notes (Addendum)
Chronic Care Management Pharmacy Assistant   Name: Whitney [name]  MRN: [id number] DOB: [birth year]  Whitney [name] is an 70 y.o. year old female who presents for his initial CCM visit with the clinical pharmacist.  Reason for Encounter: Chart Prep    Conditions to be addressed/monitored: HTN, Afib, HLD, Vitamin D deficiency  Primary concerns for visit include: HTN  Recent office visits: 01/01/21 Dr. Humphrey Rolls For COVID/follow-up. No medication changes.   12/17/20 Dr. Humphrey Rolls For follow-up. No medication changes.  11/12/20  Whitney Ochoa, NP. For medicare wellness. No medication changes.   Recent consult visits:  02/20/21 Physical Med  Meeler, Whitney Fee, NP  For pain. No medication changes. 01/29/21 Whitney Bellows, MD For follow-up. No medications. No medication changes.  12/11/20 Cardiology Fath, Whitney Gell, MD. For follow-up. No medication changes.  11/26/20 Whitney Rued, MD. For acute diverticulitis. No medication changes.  11/22/20 Whitney Rued, MD. For acute diverticulitis. STARTED Cipro 500 mg 2 times daily and Metrondazole 250 mg 3 times daily.  11/07/20 Whitney Rued, MD. For abdominal pain. No medication changes.  11/02/20 Physical Med  Meeler, Whitney Fee, NP  For pain. No medication changes. 10/25/20 Whitney Sago, MD. No medication changes.  10/09/20 Nephrology Whitney Legato, MD  For follow-up. No medication changes.  10/02/20 Orthopedic Surgery Whitney [name].   Hospital visits:  02/25/21 [facility] (3 Days) For total knee arthroplasty. STOPPED Acetaminophen and Asprin 81 tablet.   Medications: Outpatient Encounter Medications as of 03/19/2021  Medication Sig   albuterol (VENTOLIN HFA) 108 (90 Base) MCG/ACT inhaler Inhale 2 puffs into the lungs every 4 (four) hours as needed for wheezing or shortness of breath.   amLODipine (NORVASC) 5 MG tablet Take 1 tablet (5 mg total) by mouth 2 (two) times daily.   aspirin 81 MG chewable tablet Chew 1  tablet (81 mg total) by mouth 2 (two) times daily.   carvedilol (COREG) 6.25 MG tablet Take 1 tablet (6.25 mg total) by mouth 2 (two) times daily with a meal.   Cholecalciferol (VITAMIN D) 50 MCG (2000 UT) tablet Take 2,000 Units by mouth in the morning and at bedtime.    diclofenac Sodium (VOLTAREN) 1 % GEL Apply 2 g topically daily as needed (Knee pain).   docusate sodium (COLACE) 100 MG capsule Take 1 capsule (100 mg total) by mouth 2 (two) times daily.   ezetimibe (ZETIA) 10 MG tablet Take 1 tablet (10 mg total) by mouth daily. (Patient taking differently: Take 10 mg by mouth every morning.)   flecainide (TAMBOCOR) 50 MG tablet Take 50 mg by mouth as needed.   gabapentin (NEURONTIN) 100 MG capsule Take 200 mg by mouth 2 (two) times daily.   hydrALAZINE (APRESOLINE) 25 MG tablet Take 1 tablet (25 mg total) by mouth 3 (three) times daily. For HTN   HYDROcodone-acetaminophen (NORCO) 7.5-325 MG tablet Take 1 tablet by mouth every 4 (four) hours as needed for moderate pain (pain score 7-10).   methocarbamol (ROBAXIN) 500 MG tablet Take 1 tablet (500 mg total) by mouth 4 (four) times daily.   orlistat (ALLI) 60 MG capsule Take 60 mg by mouth 3 (three) times daily with meals. (Patient not taking: Reported on 02/15/2021)   rOPINIRole (REQUIP) 0.5 MG tablet Take 0.5-1 mg by mouth 2 (two) times daily as needed (pain). (Patient not taking: Reported on 02/15/2021)   No facility-administered encounter medications on file as of 03/19/2021.    Have you seen any other  providers since your last visit? Patient stated no.  Any changes in your medications or health? Patient stated no.   Any side effects from any medications? Patient stated one of her medications is causing achy headache and stomach pain but she takes all of her medication together.   Do you have an symptoms or problems not managed by your medications? Patient stated no.  Any concerns about your health right now? Patient stated no.   Has your  provider asked that you check blood pressure, blood sugar, or follow special diet at home? Patient stated she checks her blood pressure.   Do you get any type of exercise on a regular basis? Patient stated she does physical therapy.  Can you think of a goal you would like to reach for your health? Patient stated she wants to walk without a cain.   Do you have any problems getting your medications? Patient stated no.   Is there anything that you would like to discuss during the appointment? Patient stated she wants to get some information on her Flecainide 50 mg it is by mouth as needed but she's been having to take it almost every day and wants to know what should she do.  Please bring medications and supplements to appointment, patient reminded of her face to face appointment on 03/22/21 at 11 am .   [name], Whitney Pharmacist Assistant [phone]

## 2021-03-19 NOTE — Therapy (Addendum)
[facility] [address], [place], [address] Phone: [phone]   Fax:  [phone]  Physical Therapy Evaluation  Patient Details  Name: Whitney [name] MRN: [id number] Date of Birth: 11/30/1950 Referring Provider (PT): Kurtis Bushman, MD.   Encounter Date: 03/19/2021   PT End of Session - 03/19/21 1056     Visit Number 1    Number of Visits 24    Date for PT Re-Evaluation 06/11/21    Authorization Type [employer] MEDICARE (Referring periord from 03/19/2021)    Progress Note Due on Visit 10    PT Start Time 0903    PT Stop Time 0945    PT Time Calculation (min) 42 min    Activity Tolerance Patient limited by pain    Behavior During Therapy [facility] for tasks assessed/performed             Past Medical History:  Diagnosis Date   Abnormal Pap smear of cervix    Pt states she had colposcopy    Anxiety    Aortic atherosclerosis ([place])    Arthritis    CAD (coronary artery disease)    Chronic kidney disease    s/p LEFT nephrectomy   Chronic lower back pain    COVID    Depression    Diverticulitis    Dyspnea    History of methicillin resistant staphylococcus aureus (MRSA) 12/21/2020   nasal swab pcr   Hx of unilateral nephrectomy 1979   Left Nephrectomy   Hypertension    Lower extremity edema    PAF (paroxysmal atrial fibrillation) (HCC)    Palpitations    PMB (postmenopausal bleeding)    Restless leg syndrome    Sleep apnea    has no cpap   Symptomatic bradycardia    Vitamin D deficiency     Past Surgical History:  Procedure Laterality Date   BIOPSY  10/31/2019   Procedure: BIOPSY;  Surgeon: Irving Copas., MD;  Location: [facility] ENDOSCOPY;  Service: Gastroenterology;;   CARDIAC CATHETERIZATION     CHOLECYSTECTOMY     COLONOSCOPY WITH PROPOFOL N/A 06/13/2019   Procedure: COLONOSCOPY WITH PROPOFOL;  Surgeon: [name], MD;  Location: [facility] ENDOSCOPY;  Service: Gastroenterology;  Laterality: N/A;    COLONOSCOPY WITH PROPOFOL N/A 08/12/2019   Procedure: COLONOSCOPY WITH PROPOFOL;  Surgeon: [name], MD;  Location: [facility] ENDOSCOPY;  Service: Gastroenterology;  Laterality: N/A;   COLONOSCOPY WITH PROPOFOL N/A 09/19/2019   Procedure: COLONOSCOPY WITH PROPOFOL;  Surgeon: [name], MD;  Location: [facility] ENDOSCOPY;  Service: Gastroenterology;  Laterality: N/A;   DILATION AND CURETTAGE OF UTERUS     ESOPHAGOGASTRODUODENOSCOPY (EGD) WITH PROPOFOL N/A 06/13/2019   Procedure: ESOPHAGOGASTRODUODENOSCOPY (EGD) WITH PROPOFOL;  Surgeon: [name], MD;  Location: [facility] ENDOSCOPY;  Service: Gastroenterology;  Laterality: N/A;  Pt will go for COVID test on [id number] as she uses public transportation and will be in the area on that day.    ESOPHAGOGASTRODUODENOSCOPY (EGD) WITH PROPOFOL N/A 10/31/2019   Procedure: ESOPHAGOGASTRODUODENOSCOPY (EGD) WITH PROPOFOL;  Surgeon: Rush Landmark Telford Nab., MD;  Location: [place];  Service: Gastroenterology;  Laterality: N/A;   HEMOSTASIS CLIP PLACEMENT  10/31/2019   Procedure: HEMOSTASIS CLIP PLACEMENT;  Surgeon: Irving Copas., MD;  Location: [place];  Service: Gastroenterology;;   HERNIA REPAIR     HYSTEROSCOPY WITH D & C N/A 02/16/2017   Procedure: DILATATION AND CURETTAGE /HYSTEROSCOPY;  Surgeon: Brayton Mars, MD;  Location: [facility] ORS;  Service: Gynecology;  Laterality: N/A;   JOINT REPLACEMENT Left    knee   kidney removal Left    KIDNEY SURGERY Left 1979   left kidney removed   LEFT HEART CATH AND CORONARY ANGIOGRAPHY N/A 10/21/2017   Procedure: LEFT HEART CATH AND CORONARY ANGIOGRAPHY;  Surgeon: Teodoro Spray, MD;  Location: [place] CV [facility];  Service: Cardiovascular;  Laterality: N/A;   POLYPECTOMY  10/31/2019   Procedure: POLYPECTOMY;  Surgeon: Rush Landmark Telford Nab., MD;  Location: [place];  Service: Gastroenterology;;   TOTAL KNEE ARTHROPLASTY Left 07/06/2017   Procedure: TOTAL KNEE ARTHROPLASTY;  Surgeon: Lovell Sheehan,  MD;  Location: [facility] ORS;  Service: Orthopedics;  Laterality: Left;   TOTAL KNEE ARTHROPLASTY Right 02/25/2021   Procedure: TOTAL KNEE ARTHROPLASTY;  Surgeon: Lovell Sheehan, MD;  Location: [facility] ORS;  Service: Orthopedics;  Laterality: Right;   UPPER ESOPHAGEAL ENDOSCOPIC ULTRASOUND (EUS) N/A 10/31/2019   Procedure: UPPER ESOPHAGEAL ENDOSCOPIC ULTRASOUND (EUS);  Surgeon: Irving Copas., MD;  Location: [place];  Service: Gastroenterology;  Laterality: N/A;   WRIST ARTHROSCOPY Left 1970s   XI ROBOTIC ASSISTED VENTRAL HERNIA N/A 12/01/2019   Procedure: XI ROBOTIC ASSISTED VENTRAL HERNIA;  Surgeon: Jules Husbands, MD;  Location: [facility] ORS;  Service: General;  Laterality: N/A;    Vitals:   03/19/21 1022  BP: (!) 143/76  Pulse: 81  SpO2: 100%      Subjective Assessment - 03/19/21 1008     Subjective Patient arrives to PT with rolling walker and complaints of R knee pain, swelling and warmth s/p TKA (02/25/2021). Patient states that this recovery has been much harder than her L TKA. She notes more pain and knee swelling after this surgery. Patient states that she has restless leg syndrome in R leg and believes that restless leg syndrome pain is making this recovery more difficult. Patient states that pain in R leg starts in the arch of her foot and travels up into her hip. Patient states that dizziness episodes experienced in the hospital are not really a problem that much now. However patient then recalls feeling dizzy in the shower recently and having to sit down. Patient reports using walker at home and in the community at all times. Patient tried to use a cane after surgery, but reports that L knee buckled and gave out while walking. She states that she is going to keep using walker until she feels more stable. Patient states that she has to sit in a chair while cooking or doing dishes. Patient reports being independent and able to perform all ADLs without rest prior to R TKA surgery and at  that time she did not need an AD to ambulate. Patient reports doing quad sets, ankle pumps, and straight leg raise exercises since surgery. Patient recalls riding bike in hospital after surgery. Patient states she rode for up to 15 minutes and would like to continue riding bike in PT. Patient states that she spend 14 days in a SNF after discharge from the hospital. Patient reports taking her pain medication to manage symptoms usually; However, patient did not take any pain medication before today's PT session. She thought that since today was the evaluation she would not be moving as much as a normal PT session. She expresses concern with addiction due to family member past experience. She states that she tires to substitute tylenol for her narcotic unless he R leg is really hurting. Patient notes bilateral wrist weakness. Patient also states that she especially notices it  while using her walker.    Pertinent History Patient is a 70 year old female who presents to outpatient physical therapy with a referral s/p R TKA (02/25/2021). This patient's chief complaints consist of R knee and leg pain and limited ROM, leading to the following functional deficits: difficulty with walking, standing, standing from sitting, going up/down stairs, bed mobility, getting in the shower/tub, getting out of performing ADLs such as sweeping, cooking, cleaning, doing dishes. Relevant past medical history and comorbidities include: asthma, sleep apnea, HTN, CAD, Afib, Anxiety, depression, GERD, renal insufficiency, arthritis, and CKD, L TKA, obesity. Patient denies, history of cancer, stroke, seizure, major lung or heart problems, unexplained weight loss, numbness in groin, bowel or bladder changes.    Limitations Standing;Walking;House hold activities;Other (comment);Lifting;Sitting   Difficulty with walking, standing, standing from sitting, going up/down stairs, bed mobility, getting in the shower/tub, getting out of performing ADLs  such as sweeping, cooking, cleaning, doing dishes   How long can you walk comfortably? 2 minutes    Patient Stated Goals "walk without a walker and without a cane"    Currently in Pain? Yes    Pain Score 5    B: 0 W: 10   Pain Location Knee    Pain Orientation Right    Pain Descriptors / Indicators Aching    Pain Type Surgical pain;Chronic pain    Pain Radiating Towards Patient reports pain radiating from R hip to R foot that was present before TKA.    Pain Onset Other (comment)   Surgical pain onset 02/25/2021. Radiating R leg pain occured prior to surgery   Pain Frequency Intermittent    Aggravating Factors  movement, bending knee, walking,    Pain Relieving Factors Icing, massage gun to calf/arch of her foot, pain medication (narcotic or tylenol)    Effect of Pain on Daily Activities Difficulty with walking, standing, standing from sitting, going up/down stairs, bed mobility, getting in the shower/tub, getting out of performing ADLs such as sweeping, cooking, cleaning, doing dishes                [facility] PT Assessment - 03/19/21 0001       Assessment   Medical Diagnosis s/p R TKA    Referring Provider (PT) Kurtis Bushman, MD.    Onset Date/Surgical Date 02/25/21    Next MD Visit September    Prior Therapy PT for s/p L TKA, PT prior to R TKA      Restrictions   Weight Bearing Restrictions Yes    RLE Weight Bearing Weight bearing as tolerated      Balance Screen   Has the patient fallen in the past 6 months No    Has the patient had a decrease in activity level because of a fear of falling?  No    Is the patient reluctant to leave their home because of a fear of falling?  No      Home Environment   Living Environment Private residence    [place] Level entry    [place] One level    [place] - 2 wheels;[place]ane - quad      Prior Function   Level of Independence Independent with basic ADLs    Vocation Retired    Leisure read,  listen to music      Cognition   Overall Cognitive Status Within Functional Limits for tasks assessed  OBJECTIVE:   VITALS:  - BP: 143/76 mmHg - Taken on L wrist - HR: 81 bpm - SpO2: 100% on RA   EDUCATION/COGNITION:. Within Functional Limits for Tasks Assessed   FUNCTION FOTO Score: 58   OBSERVATION/INSPECTION: Bed mobility: Patient had great difficulty with bed mobility due to impaired strength and pain. Maintained straight L leg while laying down. Skin:  The incision sites appear to be healing well with no drainage or signs of infection present. Slight scabbing noted. Patient skin is warm to touch and swollen around knee. Skin is tight/taut near incision site. Patient educated to call physician if excessive warmth is noted.  Gait: Patient ambulated with antalgic gait and heavy UE support/reliance on rolling walker. Limited R knee flexion and extension while ambulating.   LE PERIPHERAL JOINT MOTION (PROM in degrees)  03/19/2021 Date Date  Joint/Motion R/L R/L R/L  Hip       Extension     Flexion     Abduction     Adduction     External Rotation     Internal Rotation     Knee     Flexion 93*/      Extension - 11*/     *Indicates pain Comments: empty end feel noted with R knee flexion. Patient reports primary pain through R anterior leg and hip and mild pain on the front of her knee.     LE MUSCLE PERFORMANCE (MMT): *Indicates pain 03/19/21 Date Date  Joint/Motion R/L R/L R/L  Hip        Flexion 4/4    Extension (knee ext)     Abduction     Adduction     Knee        Extension 3+*/    Flexion 3+*/     *denotes pain.   PALPATION: R knee tender to palpation near incision site. Noted warmth and tightness of skin.    Objective measurements completed on examination: See above findings.   TREATMENT - Seated straight leg knee extension, 1x5 with 3 second hold.  - HEP instruction and education - Education on diagnosis, prognosis, POC, and antomy and  physiology of current condition.     PT Education - 03/19/21 1056     Education Details Education on diagnosis, prognosis, POC, and antomy and physiology of current condition and HEP.    Person(s) Educated Patient    Methods Explanation;Demonstration;Tactile cues;Verbal cues    Comprehension Verbalized understanding;Returned demonstration;Tactile cues required;Verbal cues required              PT Short Term Goals - 03/19/21 1058       PT SHORT TERM GOAL #1   Title Patient will be independent with inital home exercise program to improve strength/mobility and functional independence with ADLs.    Baseline Initial HEP provided at eval - more formal HEP to be provided at second visit.    Time 3    Period Weeks    Status New    Target Date 04/09/21               PT Long Term Goals - 03/19/21 1101       PT LONG TERM GOAL #1   Title Patient will be independent with long term home exercise program to improve strength/mobility and functional independence with ADLs.    Baseline Initial HEP provided at eval - more formal HEP to be provided at second visit.   TARGET DATE FOR ALL LONG TERM GOALS: 06/11/2021   Time  12    Period Weeks    Status New   TARGET DATE FOR ALL LONG TERM GOALS: 06/11/2021   Target Date 06/11/21      PT LONG TERM GOAL #2   Title Demonstrate improved FOTO score by 10 units or greater to demonstrate improvement in overall condition and self-reported functional ability.    Baseline 58 (03/19/2021)    Time 12    Period Weeks    Status New      PT LONG TERM GOAL #3   Title Pt will increase R knee extension/flexion strength to at least a 5/5 MMT grade without pain in order to demonstrate improvement in strength and function    Baseline 3+ strength limited by pain.      PT LONG TERM GOAL #4   Title Patient will ambulate equal or greater than 1000 feet on 6 Minute Walk Test with no AD to demonstrate improved community and household mobility.    Baseline  Patient unable to walk greater than 2 minutes (03/19/2021)    Time 12    Period Weeks      PT LONG TERM GOAL #5   Title Patient will improve R knee flexion to 120 degrees or greater to demonstrate improvements in mobility and community ambulation without compensation.    Baseline 93 degrees (03/19/2021)    Time 12    Period Weeks    Status New      Additional Long Term Goals   Additional Long Term Goals Yes      PT LONG TERM GOAL #6   Title Patient will improve R knee extension to 0 degrees or greater to demonstrate improvements in mobility and community ambulation without compensation.    Baseline -11 degrees (03/19/2021)    Time 12    Period Weeks    Status New      PT LONG TERM GOAL #7   Title Patient will reduce 5 Time Sit To Stand to <15 seconds to reduce fall risk and demonstrate improved transfer/gait ability.    Baseline To be tested at next session    Time [place] - 03/19/21 1850     Clinical Impression Statement Patient is a 70 year old female who presents to outpatient physical therapy with a referral s/p R TKA (02/25/2021). This patient's chief complaints consist of R knee and leg pain and limited R knee ROM.Patient presents with significant pain, ROM, activity tolerance, motor control, sensation, balance, muscle performance (strength, power, endurance), muscle tension, and gait impairments that are limit ability functional activities such as walking, standing, standing from sitting, going up/down stairs, bed mobility, getting in the shower/tub, getting out of performing ADLs such as sweeping, cooking, cleaning, doing dishes. Patient ambulation ability and decreased quad strength suggest patient is at risk for falls. Additionally, patient FOTO score suggestion impairments in overall patient condition and self reported functional ability (58 03/19/2021). Patient will benefit from skilled physical therapy intervention to  address current body structure impairments and activity limitations to improve function and work towards goals set in current POC in order to return to prior level of function or maximal functional improvement.    Personal Factors and Comorbidities Age;Comorbidity 3+;Past/Current Experience    Comorbidities asthma, sleep apnea, HTN, CAD, Afib, Anxiety, depression, GERD, renal insufficiency, arthritis, and CKD, L TKA, obesity  Examination-Activity Limitations Bathing;Sit;Stand;Bed Mobility;Locomotion Level;Bend;Dressing;Squat;Transfers;Hygiene/Grooming;Stairs    Examination-Participation Restrictions Cleaning;Yard Work;Community Activity;Laundry    Stability/Clinical Decision Making Stable/Uncomplicated    Clinical Decision Making Low    Rehab Potential Good    PT Frequency 2x / week    PT Duration 12 weeks    PT Treatment/Interventions ADLs/Self Care Home Management;Cryotherapy;Electrical Stimulation;Moist Heat;Gait training;Functional mobility training;Stair training;Balance training;Therapeutic exercise;Therapeutic activities;Neuromuscular re-education;Patient/family education;Manual techniques;[place] mobilization;Passive range of motion;Joint Manipulations;Dry needling    PT Next Visit Plan Progress LE strength and mobility as tolerated    PT Home Exercise Plan More formal HEP to be provided at second session    Consulted and Agree with Plan of Care Patient             Patient will benefit from skilled therapeutic intervention in order to improve the following deficits and impairments:  Abnormal gait, Decreased activity tolerance, Decreased endurance, Decreased range of motion, Decreased skin integrity, Decreased strength, Hypomobility, Impaired perceived functional ability, Impaired sensation, Improper body mechanics, Pain, Decreased balance, Decreased mobility, Difficulty walking, Increased edema, Impaired flexibility, Obesity  Visit Diagnosis: Acute pain of right knee - Plan: PT plan of  care cert/re-cert  Muscle weakness (generalized) - Plan: PT plan of care cert/re-cert  Stiffness of right knee, not elsewhere classified - Plan: PT plan of care cert/re-cert  Difficulty in walking, not elsewhere classified - Plan: PT plan of care cert/re-cert     Problem List Patient Active Problem List   Diagnosis Date Noted   History of total knee arthroplasty, right 02/25/2021   Intestinal metaplasia of gastric cardia 11/07/2020   Mild intermittent asthma without complication 123456   Shortness of breath 08/08/2020   Vaginal candidiasis 08/08/2020   Atherosclerosis of aorta (HCC) 07/09/2020   Acute diverticulitis 05/13/2020   Fever blister 10/15/2019   Cerumen in auditory canal on examination 10/15/2019   Bilateral hearing loss 10/15/2019   Need for vaccination against Streptococcus pneumoniae using pneumococcal conjugate vaccine 7 10/15/2019   Encounter for screening mammogram for malignant neoplasm of breast 10/15/2019   Colon cancer screening 10/15/2019   Weakness 09/02/2019   Headache disorder 09/02/2019   Stage 3b chronic kidney disease (HCC) 08/10/2019   Elevated erythrocyte sedimentation rate 11/02/2018   Lumbar spondylosis 11/02/2018   PVC's (premature ventricular contractions) 10/18/2018   Primary osteoarthritis of right knee 09/15/2018   Encounter for pre-operative examination 09/15/2018   Obstructive sleep apnea 09/15/2018   Calculus of gallbladder without cholecystitis without obstruction 09/15/2018   Calculus of gallbladder with cholecystitis without biliary obstruction 01/27/2018   Restless leg syndrome 01/27/2018   Generalized abdominal pain 01/11/2018   Cyst of pancreas 01/11/2018   Other specified disorders of kidney and ureter 01/11/2018   Primary insomnia 01/11/2018   Paroxysmal atrial fibrillation ([place]) 11/12/2017   Essential hypertension 10/25/2017   Mixed hyperlipidemia 10/25/2017   Low back pain at multiple sites 10/25/2017   Dysuria  10/25/2017   Encounter for general adult medical examination with abnormal findings 10/25/2017   Vitamin D deficiency 10/25/2017   Near syncope    Symptomatic bradycardia 10/19/2017   Carpal tunnel syndrome 10/12/2017   Primary osteoarthritis of left knee 07/09/2017   History of total knee arthroplasty 07/06/2017   Chest pain 06/29/2017   Postop check 02/25/2017   Impingement syndrome of shoulder region 01/20/2017   Neck pain 01/20/2017   Obesity (BMI 35.0-39.9 without comorbidity) 07/15/2016   Endometrial polyp 07/15/2016   Morbid obesity ([place]) 07/15/2016   Family history of breast cancer in first degree relative  07/15/2016   Family history of ovarian cancer 07/15/2016   Knee pain 07/04/2016   Bilateral leg pain 06/24/2016    Sherryll Burger, SPT  Student physical therapist under direct supervision of licensed physical therapists during the entirety of the session.   Everlean Alstrom. Graylon Good, PT, DPT 03/19/21, 7:40 PM   [facility] [address] [place], [address] Phone: [phone]   Fax:  [phone]  Name: Whitney [name] MRN: [id number] Date of Birth: 06-06-1951

## 2021-03-21 ENCOUNTER — Ambulatory Visit: Payer: Medicare Other

## 2021-03-21 DIAGNOSIS — G8929 Other chronic pain: Secondary | ICD-10-CM | POA: Diagnosis not present

## 2021-03-21 DIAGNOSIS — M25661 Stiffness of right knee, not elsewhere classified: Secondary | ICD-10-CM | POA: Diagnosis not present

## 2021-03-21 DIAGNOSIS — M6281 Muscle weakness (generalized): Secondary | ICD-10-CM | POA: Diagnosis not present

## 2021-03-21 DIAGNOSIS — R262 Difficulty in walking, not elsewhere classified: Secondary | ICD-10-CM | POA: Diagnosis not present

## 2021-03-21 DIAGNOSIS — M25561 Pain in right knee: Secondary | ICD-10-CM

## 2021-03-21 DIAGNOSIS — M25552 Pain in left hip: Secondary | ICD-10-CM | POA: Diagnosis not present

## 2021-03-21 NOTE — Therapy (Signed)
[place] [address], [place], [address] Phone: [phone]   Fax:  [phone]  Physical Therapy Treatment  Patient Details  Name: Whitney [name] MRN: [id number] Date of Birth: [birth year] Referring Provider (PT): Kurtis Bushman, MD.   Encounter Date: 03/21/2021   PT End of Session - 03/21/21 1049     Visit Number 2    Number of Visits 24    Date for PT Re-Evaluation 06/11/21    Authorization Type [employer] MEDICARE (Referring periord from 03/19/2021)    Progress Note Due on Visit 10    PT Start Time 0900    PT Stop Time 0943    PT Time Calculation (min) 43 min    Activity Tolerance Patient tolerated treatment well;Patient limited by pain    Behavior During Therapy [facility] for tasks assessed/performed             Past Medical History:  Diagnosis Date   Abnormal Pap smear of cervix    Pt states she had colposcopy    Anxiety    Aortic atherosclerosis ([place])    Arthritis    CAD (coronary artery disease)    Chronic kidney disease    s/p LEFT nephrectomy   Chronic lower back pain    COVID    Depression    Diverticulitis    Dyspnea    History of methicillin resistant staphylococcus aureus (MRSA) 12/21/2020   nasal swab pcr   Hx of unilateral nephrectomy 1979   Left Nephrectomy   Hypertension    Lower extremity edema    PAF (paroxysmal atrial fibrillation) (HCC)    Palpitations    PMB (postmenopausal bleeding)    Restless leg syndrome    Sleep apnea    has no cpap   Symptomatic bradycardia    Vitamin D deficiency     Past Surgical History:  Procedure Laterality Date   BIOPSY  10/31/2019   Procedure: BIOPSY;  Surgeon: Irving Copas., MD;  Location: [place];  Service: Gastroenterology;;   CARDIAC CATHETERIZATION     CHOLECYSTECTOMY     COLONOSCOPY WITH PROPOFOL N/A 06/13/2019   Procedure: COLONOSCOPY WITH PROPOFOL;  Surgeon: [name], MD;  Location: [facility] ENDOSCOPY;  Service:  Gastroenterology;  Laterality: N/A;   COLONOSCOPY WITH PROPOFOL N/A 08/12/2019   Procedure: COLONOSCOPY WITH PROPOFOL;  Surgeon: [name], MD;  Location: [facility] ENDOSCOPY;  Service: Gastroenterology;  Laterality: N/A;   COLONOSCOPY WITH PROPOFOL N/A 09/19/2019   Procedure: COLONOSCOPY WITH PROPOFOL;  Surgeon: [name], MD;  Location: [facility] ENDOSCOPY;  Service: Gastroenterology;  Laterality: N/A;   DILATION AND CURETTAGE OF UTERUS     ESOPHAGOGASTRODUODENOSCOPY (EGD) WITH PROPOFOL N/A 06/13/2019   Procedure: ESOPHAGOGASTRODUODENOSCOPY (EGD) WITH PROPOFOL;  Surgeon: [name], MD;  Location: [facility] ENDOSCOPY;  Service: Gastroenterology;  Laterality: N/A;  Pt will go for COVID test on [id number] as she uses public transportation and will be in the area on that day.    ESOPHAGOGASTRODUODENOSCOPY (EGD) WITH PROPOFOL N/A 10/31/2019   Procedure: ESOPHAGOGASTRODUODENOSCOPY (EGD) WITH PROPOFOL;  Surgeon: Rush Landmark Telford Nab., MD;  Location: [place];  Service: Gastroenterology;  Laterality: N/A;   HEMOSTASIS CLIP PLACEMENT  10/31/2019   Procedure: HEMOSTASIS CLIP PLACEMENT;  Surgeon: Irving Copas., MD;  Location: [place];  Service: Gastroenterology;;   HERNIA REPAIR     HYSTEROSCOPY WITH D & C N/A 02/16/2017   Procedure: DILATATION AND CURETTAGE /HYSTEROSCOPY;  Surgeon: Brayton Mars, MD;  Location: [facility];  Service: Gynecology;  Laterality: N/A;   JOINT REPLACEMENT Left    knee   kidney removal Left    KIDNEY SURGERY Left 1979   left kidney removed   LEFT HEART CATH AND CORONARY ANGIOGRAPHY N/A 10/21/2017   Procedure: LEFT HEART CATH AND CORONARY ANGIOGRAPHY;  Surgeon: Teodoro Spray, MD;  Location: [place] [facility];  Service: Cardiovascular;  Laterality: N/A;   POLYPECTOMY  10/31/2019   Procedure: POLYPECTOMY;  Surgeon: Rush Landmark Telford Nab., MD;  Location: [place];  Service: Gastroenterology;;   TOTAL KNEE ARTHROPLASTY Left 07/06/2017   Procedure: TOTAL KNEE  ARTHROPLASTY;  Surgeon: Lovell Sheehan, MD;  Location: [facility];  Service: Orthopedics;  Laterality: Left;   TOTAL KNEE ARTHROPLASTY Right 02/25/2021   Procedure: TOTAL KNEE ARTHROPLASTY;  Surgeon: Lovell Sheehan, MD;  Location: [facility];  Service: Orthopedics;  Laterality: Right;   UPPER ESOPHAGEAL ENDOSCOPIC ULTRASOUND (EUS) N/A 10/31/2019   Procedure: UPPER ESOPHAGEAL ENDOSCOPIC ULTRASOUND (EUS);  Surgeon: Irving Copas., MD;  Location: [place];  Service: Gastroenterology;  Laterality: N/A;   WRIST ARTHROSCOPY Left 1970s   XI ROBOTIC ASSISTED VENTRAL HERNIA N/A 12/01/2019   Procedure: XI ROBOTIC ASSISTED VENTRAL HERNIA;  Surgeon: Jules Husbands, MD;  Location: [facility];  Service: General;  Laterality: N/A;    There were no vitals filed for this visit.   Subjective Assessment - 03/21/21 0920     Subjective Patient reports 6/10 pain in back of right thigh. Patient pain does not radiate into butt muscles or back. Patient reports sore spot on medial right knee, but does not provide pain rating. Patient reports performing HEP that hospital provider her with. Patient denies dizziness/lightheadedness since last appointment. However, patient reports nausea at begining of session due to pain medication.    Pertinent History Patient is a 70 year old female who presents to outpatient physical therapy with a referral s/p R TKA (02/25/2021). This patient's chief complaints consist of R knee and leg pain and limited ROM, leading to the following functional deficits: difficulty with walking, standing, standing from sitting, going up/down stairs, bed mobility, getting in the shower/tub, getting out of performing ADLs such as sweeping, cooking, cleaning, doing dishes. Relevant past medical history and comorbidities include: asthma, sleep apnea, HTN, CAD, Afib, Anxiety, depression, GERD, renal insufficiency, arthritis, and CKD, L TKA, obesity. Patient denies, history of cancer, stroke, seizure, major  lung or heart problems, unexplained weight loss, numbness in groin, bowel or bladder changes.    Limitations Standing;Walking;House hold activities;Other (comment);Lifting;Sitting   Difficulty with walking, standing, standing from sitting, going up/down stairs, bed mobility, getting in the shower/tub, getting out of performing ADLs such as sweeping, cooking, cleaning, doing dishes   How long can you walk comfortably? 2 minutes    Patient Stated Goals "walk without a walker and without a cane"    Currently in Pain? Yes    Pain Score 6     Pain Location Leg    Pain Orientation Right    Pain Type Surgical pain    Pain Onset Other (comment)   Surgical pain onset 02/25/2021. Radiating R leg pain occured prior to surgery             OBJECTIVE:  Vitals: Taken at beginning of session BP: 154/99  mmHg. Patient reports taking blood pressure medication at 8 this morning.  SpO2: 100  HR: 73   Therapeutic exercise: to centralize symptoms and improve ROM, strength, muscular endurance, and activity tolerance required for successful completion  of functional activities.   - Objective measurements completed on examination to maximize patient safety: See above findings.   - NuStep using just lower extremities only. For improved extremity mobility, muscular endurance, and activity tolerance; and to induce the analgesic effect of aerobic exercise, stimulate improved joint nutrition, and prepare body structures and systems for following interventions. 4 minutes at level 5 with seat at position 9 to promote terminal knee extension. 4 minutes at level 3 with seat at position 7 to promote knee flexion. Required min assistance to set up machine.   - ambulation 100' with emphasis on R heel strike to promote terminal knee extension and reciprocal step through pattern in clinic. - long sitting quad sets with 5lbs AW on quad to promote terminal knee extension, 3x5 with 10 second hold.  - supine straight leg raise  2x10   Therapeutic Activity:   Extensive education provided during therex on correct R knee positioning to promote terminal knee extension  (to optimize gait mechanics) and to reduce risk of R knee flexor contracture (pt reports resting with pillow underneath popliteal foss of R knee). Further education provided on importance of reciprocal gait with focus on consistent R heel strike at initial contact to promote R knee terminal knee extension. PT educated pt on continual use of RW in community and household due to reports of increased bouts or R knee instability with SPC use in home. Utilization and importance of added stability of RW to reduce risk of falls until pt displays adequate R quad control in clinic before progressing to [facility] use.    Pt required multimodal cuing for proper technique and to facilitate improved neuromuscular control, strength, range of motion, and functional ability resulting in improved performance and form.       PT Education - 03/21/21 1042     Education Details exercise, purpose form. Education to avoid placing a pillow behind R knee for extended periods of time. Recommendation to place pillow or towel under ankle while resting to promote terminal knee extension.    Methods Explanation;Demonstration;Tactile cues;Verbal cues    Comprehension Verbalized understanding;Verbal cues required;Returned demonstration;Tactile cues required              PT Short Term Goals - 03/19/21 1058       PT SHORT TERM GOAL #1   Title Patient will be independent with inital home exercise program to improve strength/mobility and functional independence with ADLs.    Baseline Initial HEP provided at eval - more formal HEP to be provided at second visit.    Time 3    Period Weeks    Status New    Target Date 04/09/21               PT Long Term Goals - 03/19/21 1101       PT LONG TERM GOAL #1   Title Patient will be independent with long term home exercise program to  improve strength/mobility and functional independence with ADLs.    Baseline Initial HEP provided at eval - more formal HEP to be provided at second visit.   TARGET DATE FOR ALL LONG TERM GOALS: 06/11/2021   Time 12    Period Weeks    Status New   TARGET DATE FOR ALL LONG TERM GOALS: 06/11/2021   Target Date 06/11/21      PT LONG TERM GOAL #2   Title Demonstrate improved FOTO score by 10 units or greater to demonstrate improvement in overall condition and self-reported functional  ability.    Baseline 58 (03/19/2021)    Time 12    Period Weeks    Status New      PT LONG TERM GOAL #3   Title Pt will increase R knee extension/flexion strength to at least a 5/5 MMT grade without pain in order to demonstrate improvement in strength and function    Baseline 3+ strength limited by pain.      PT LONG TERM GOAL #4   Title Patient will ambulate equal or greater than 1000 feet on 6 Minute Walk Test with no AD to demonstrate improved community and household mobility.    Baseline Patient unable to walk greater than 2 minutes (03/19/2021)    Time 12    Period Weeks      PT LONG TERM GOAL #5   Title Patient will improve R knee flexion to 120 degrees or greater to demonstrate improvements in mobility and community ambulation without compensation.    Baseline 93 degrees (03/19/2021)    Time 12    Period Weeks    Status New      Additional Long Term Goals   Additional Long Term Goals Yes      PT LONG TERM GOAL #6   Title Patient will improve R knee extension to 0 degrees or greater to demonstrate improvements in mobility and community ambulation without compensation.    Baseline -11 degrees (03/19/2021)    Time 12    Period Weeks    Status New      PT LONG TERM GOAL #7   Title Patient will reduce 5 Time Sit To Stand to <15 seconds to reduce fall risk and demonstrate improved transfer/gait ability.    Baseline To be tested at next session    Time 12    Period Weeks    Status New                    Plan - 03/21/21 1050     Clinical Impression Statement Patient tolerated treatment well. Patient demonstrated improvements in activity tolerance and pain management during today's session. Patient knee flexion and extension remains limited and impacts patient mobility. Patient demonstrated improvements in quad strength with progression of straight leg raises. However, patient quad strength remains limited overall and impacts patient ability to ambulate safely. PT heavily reinforced education on need for appropriate R knee positioning to promote knee extension and reduced risk of R knee flexion contracture. By end of session pt able to repeat education and importance of appropraite R knee positioning in extension and importance of heel strike with gait. Pt finally educated on utilizing RW inside home and community due to reports of increased bouts of R knee buckling with use of SPC inside home. education provided to overall decrease risk of pt falls until pt displays adequate and safe quad control in clinic to progress to [facility]. Patient will continue to benefit from skilled physical therapy to address impairments, improve safety, and maximize functional mobility.    Personal Factors and Comorbidities Age;Comorbidity 3+;Past/Current Experience    Comorbidities asthma, sleep apnea, HTN, CAD, Afib, Anxiety, depression, GERD, renal insufficiency, arthritis, and CKD, L TKA, obesity    Examination-Activity Limitations Bathing;Sit;Stand;Bed Mobility;Locomotion Level;Bend;Dressing;Squat;Transfers;Hygiene/Grooming;Stairs    Examination-Participation Restrictions Cleaning;Yard Work;Community Activity;Laundry    Stability/Clinical Decision Making Stable/Uncomplicated    Rehab Potential Good    PT Frequency 2x / week    PT Duration 12 weeks    PT Treatment/Interventions ADLs/Self Care Home Management;Cryotherapy;Electrical Stimulation;Moist  Heat;Gait training;Functional mobility training;Stair  training;Balance training;Therapeutic exercise;Therapeutic activities;Neuromuscular re-education;Patient/family education;Manual techniques;Scar mobilization;Passive range of motion;Joint Manipulations;Dry needling    PT Next Visit Plan Progress LE strength and mobility as tolerated. See pt carryover with correct R knee positioning    PT Home Exercise Plan More formal HEP to be provided at second session    Consulted and Agree with Plan of Care Patient             Patient will benefit from skilled therapeutic intervention in order to improve the following deficits and impairments:  Abnormal gait, Decreased activity tolerance, Decreased endurance, Decreased range of motion, Decreased skin integrity, Decreased strength, Hypomobility, Impaired perceived functional ability, Impaired sensation, Improper body mechanics, Pain, Decreased balance, Decreased mobility, Difficulty walking, Increased edema, Impaired flexibility, Obesity  Visit Diagnosis: Acute pain of right knee  Muscle weakness (generalized)  Stiffness of right knee, not elsewhere classified  Difficulty in walking, not elsewhere classified     Problem List Patient Active Problem List   Diagnosis Date Noted   History of total knee arthroplasty, right 02/25/2021   Intestinal metaplasia of gastric cardia 11/07/2020   Mild intermittent asthma without complication 123456   Shortness of breath 08/08/2020   Vaginal candidiasis 08/08/2020   Atherosclerosis of aorta (HCC) 07/09/2020   Acute diverticulitis 05/13/2020   Fever blister 10/15/2019   Cerumen in auditory canal on examination 10/15/2019   Bilateral hearing loss 10/15/2019   Need for vaccination against Streptococcus pneumoniae using pneumococcal conjugate vaccine 7 10/15/2019   Encounter for screening mammogram for malignant neoplasm of breast 10/15/2019   Colon cancer screening 10/15/2019   Weakness 09/02/2019   Headache disorder 09/02/2019   Stage 3b chronic  kidney disease ([place]) 08/10/2019   Elevated erythrocyte sedimentation rate 11/02/2018   Lumbar spondylosis 11/02/2018   PVC's (premature ventricular contractions) 10/18/2018   Primary osteoarthritis of right knee 09/15/2018   Encounter for pre-operative examination 09/15/2018   Obstructive sleep apnea 09/15/2018   Calculus of gallbladder without cholecystitis without obstruction 09/15/2018   Calculus of gallbladder with cholecystitis without biliary obstruction 01/27/2018   Restless leg syndrome 01/27/2018   Generalized abdominal pain 01/11/2018   Cyst of pancreas 01/11/2018   Other specified disorders of kidney and ureter 01/11/2018   Primary insomnia 01/11/2018   Paroxysmal atrial fibrillation ([place]) 11/12/2017   Essential hypertension 10/25/2017   Mixed hyperlipidemia 10/25/2017   Low back pain at multiple sites 10/25/2017   Dysuria 10/25/2017   Encounter for general adult medical examination with abnormal findings 10/25/2017   Vitamin D deficiency 10/25/2017   Near syncope    Symptomatic bradycardia 10/19/2017   Carpal tunnel syndrome 10/12/2017   Primary osteoarthritis of left knee 07/09/2017   History of total knee arthroplasty 07/06/2017   Chest pain 06/29/2017   Postop check 02/25/2017   Impingement syndrome of shoulder region 01/20/2017   Neck pain 01/20/2017   Obesity (BMI 35.0-39.9 without comorbidity) 07/15/2016   Endometrial polyp 07/15/2016   Morbid obesity ([place]) 07/15/2016   Family history of breast cancer in first degree relative 07/15/2016   Family history of ovarian cancer 07/15/2016   Knee pain 07/04/2016   Bilateral leg pain 06/24/2016    Sherryll Burger, SPT  [place]em Caster. Fairly IV, PT, DPT Physical Therapist- [facility]  03/21/2021, 11:18 AM  [place] [facility] [address] [place], [address] Phone: [phone]   Fax:  [phone]  Name: TAIASHA HAYLEY MRN:  [id number] Date of Birth: [birth year]

## 2021-03-22 ENCOUNTER — Emergency Department: Payer: Medicare Other

## 2021-03-22 ENCOUNTER — Other Ambulatory Visit: Payer: Self-pay

## 2021-03-22 ENCOUNTER — Encounter: Payer: Self-pay | Admitting: *Deleted

## 2021-03-22 ENCOUNTER — Ambulatory Visit: Payer: Medicare Other | Admitting: Pharmacist

## 2021-03-22 ENCOUNTER — Emergency Department
Admission: EM | Admit: 2021-03-22 | Discharge: 2021-03-22 | Disposition: A | Discharge status: Home / Self Care | Payer: Medicare Other | Attending: Emergency Medicine | Admitting: Emergency Medicine

## 2021-03-22 DIAGNOSIS — I2510 Atherosclerotic heart disease of native coronary artery without angina pectoris: Secondary | ICD-10-CM | POA: Insufficient documentation

## 2021-03-22 DIAGNOSIS — Z96651 Presence of right artificial knee joint: Secondary | ICD-10-CM | POA: Diagnosis not present

## 2021-03-22 DIAGNOSIS — M25561 Pain in right knee: Secondary | ICD-10-CM | POA: Diagnosis not present

## 2021-03-22 DIAGNOSIS — Z79899 Other long term (current) drug therapy: Secondary | ICD-10-CM | POA: Diagnosis not present

## 2021-03-22 DIAGNOSIS — Z743 Need for continuous supervision: Secondary | ICD-10-CM | POA: Diagnosis not present

## 2021-03-22 DIAGNOSIS — G8918 Other acute postprocedural pain: Secondary | ICD-10-CM | POA: Diagnosis not present

## 2021-03-22 DIAGNOSIS — I480 Paroxysmal atrial fibrillation: Secondary | ICD-10-CM

## 2021-03-22 DIAGNOSIS — R6889 Other general symptoms and signs: Secondary | ICD-10-CM | POA: Diagnosis not present

## 2021-03-22 DIAGNOSIS — I129 Hypertensive chronic kidney disease with stage 1 through stage 4 chronic kidney disease, or unspecified chronic kidney disease: Secondary | ICD-10-CM | POA: Insufficient documentation

## 2021-03-22 DIAGNOSIS — Z96653 Presence of artificial knee joint, bilateral: Secondary | ICD-10-CM | POA: Diagnosis not present

## 2021-03-22 DIAGNOSIS — I10 Essential (primary) hypertension: Secondary | ICD-10-CM

## 2021-03-22 DIAGNOSIS — M79604 Pain in right leg: Secondary | ICD-10-CM | POA: Diagnosis not present

## 2021-03-22 DIAGNOSIS — E782 Mixed hyperlipidemia: Secondary | ICD-10-CM

## 2021-03-22 DIAGNOSIS — Z7982 Long term (current) use of aspirin: Secondary | ICD-10-CM | POA: Diagnosis not present

## 2021-03-22 DIAGNOSIS — N1832 Chronic kidney disease, stage 3b: Secondary | ICD-10-CM | POA: Insufficient documentation

## 2021-03-22 DIAGNOSIS — G2581 Restless legs syndrome: Secondary | ICD-10-CM

## 2021-03-22 DIAGNOSIS — M7989 Other specified soft tissue disorders: Secondary | ICD-10-CM | POA: Diagnosis not present

## 2021-03-22 DIAGNOSIS — Z8616 Personal history of COVID-19: Secondary | ICD-10-CM | POA: Diagnosis not present

## 2021-03-22 LAB — CBC WITH DIFFERENTIAL/PLATELET
Abs Immature Granulocytes: 0.01 10*3/uL (ref 0.00–0.07)
Basophils Absolute: 0.1 10*3/uL (ref 0.0–0.1)
Basophils Relative: 1 %
Eosinophils Absolute: 0.3 10*3/uL (ref 0.0–0.5)
Eosinophils Relative: 4 %
HCT: 35.7 % — ABNORMAL LOW (ref 36.0–46.0)
Hemoglobin: 11.3 g/dL — ABNORMAL LOW (ref 12.0–15.0)
Immature Granulocytes: 0 %
Lymphocytes Relative: 43 %
Lymphs Abs: 2.9 10*3/uL (ref 0.7–4.0)
MCH: 30.1 pg (ref 26.0–34.0)
MCHC: 31.7 g/dL (ref 30.0–36.0)
MCV: 94.9 fL (ref 80.0–100.0)
Monocytes Absolute: 0.6 10*3/uL (ref 0.1–1.0)
Monocytes Relative: 10 %
Neutro Abs: 2.8 10*3/uL (ref 1.7–7.7)
Neutrophils Relative %: 42 %
Platelets: 270 10*3/uL (ref 150–400)
RBC: 3.76 MIL/uL — ABNORMAL LOW (ref 3.87–5.11)
RDW: 13.2 % (ref 11.5–15.5)
WBC: 6.7 10*3/uL (ref 4.0–10.5)
nRBC: 0 % (ref 0.0–0.2)

## 2021-03-22 LAB — COMPREHENSIVE METABOLIC PANEL
ALT: 25 U/L (ref 0–44)
AST: 28 U/L (ref 15–41)
Albumin: 3.2 g/dL — ABNORMAL LOW (ref 3.5–5.0)
Alkaline Phosphatase: 77 U/L (ref 38–126)
Anion gap: 9 (ref 5–15)
BUN: 28 mg/dL — ABNORMAL HIGH (ref 8–23)
CO2: 22 mmol/L (ref 22–32)
Calcium: 8.4 mg/dL — ABNORMAL LOW (ref 8.9–10.3)
Chloride: 105 mmol/L (ref 98–111)
Creatinine, Ser: 1.32 mg/dL — ABNORMAL HIGH (ref 0.44–1.00)
GFR, Estimated: 43 mL/min — ABNORMAL LOW (ref >60)
Glucose, Bld: 91 mg/dL (ref 70–99)
Potassium: 4.7 mmol/L (ref 3.5–5.1)
Sodium: 136 mmol/L (ref 135–145)
Total Bilirubin: 0.7 mg/dL (ref 0.3–1.2)
Total Protein: 7.1 g/dL (ref 6.5–8.1)

## 2021-03-22 LAB — SYNOVIAL CELL COUNT + DIFF, W/ CRYSTALS
Crystals, Fluid: NONE SEEN
Eosinophils-Synovial: 1 %
Lymphocytes-Synovial Fld: 4 %
Monocyte-Macrophage-Synovial Fluid: 5 %
Neutrophil, Synovial: 90 %
Other Cells-SYN: 0
WBC, Synovial: 2064 /mm3 — ABNORMAL HIGH (ref 0–200)

## 2021-03-22 LAB — SEDIMENTATION RATE: Sed Rate: 63 mm/hr — ABNORMAL HIGH (ref 0–30)

## 2021-03-22 MED ORDER — OXYCODONE HCL 5 MG PO TABS
7.5000 mg | ORAL_TABLET | Freq: Once | ORAL | Status: AC
  Administered 2021-03-22: 7.5 mg via ORAL
  Filled 2021-03-22: qty 2

## 2021-03-22 MED ORDER — LIDOCAINE-EPINEPHRINE 2 %-1:100000 IJ SOLN
20.0000 mL | Freq: Once | INTRAMUSCULAR | Status: AC
  Administered 2021-03-22: 20 mL via INTRADERMAL
  Filled 2021-03-22: qty 1

## 2021-03-22 MED ORDER — OXYCODONE HCL 5 MG PO TABS
5.0000 mg | ORAL_TABLET | ORAL | Status: AC
Start: 2021-03-22 — End: 2021-03-22
  Administered 2021-03-22: 5 mg via ORAL
  Filled 2021-03-22: qty 1

## 2021-03-22 NOTE — ED Provider Notes (Signed)
[facility] Emergency Department Provider Note  ____________________________________________   Event Date/Time   First MD Initiated Contact with Patient 03/22/21 1730     (approximate)  I have reviewed the triage vital signs and the nursing notes.   HISTORY  Chief Complaint Knee Pain   HPI Whitney [name] is a 70 y.o. female with past medical history of CAD, paroxysmal A. fib, RLS, depression, anxiety, arthritis and recent total right knee arthroplasty on 7/25 for severe arthritis who presents for assessment of some acute on chronic pain that she states began yesterday.  Patient states has been doing PT and has not had any recent injuries or falls.  She states the pain radiates into her hip.  She does not have any pain in her ankle or in the left lower extremity or anywhere else.  She denies any fevers, chills, headache, earache, sore throat, nausea, vomiting, diarrhea, dysuria, rash or any other acute pain.  He states he has been taking her pain meds these have not been significant helping.  She thinks it is more swollen the last 24 hours.         Past Medical History:  Diagnosis Date   Abnormal Pap smear of cervix    Pt states she had colposcopy    Anxiety    Aortic atherosclerosis (HCC)    Arthritis    CAD (coronary artery disease)    Chronic kidney disease    s/p LEFT nephrectomy   Chronic lower back pain    COVID    Depression    Diverticulitis    Dyspnea    History of methicillin resistant staphylococcus aureus (MRSA) 12/21/2020   nasal swab pcr   Hx of unilateral nephrectomy 1979   Left Nephrectomy   Hypertension    Lower extremity edema    PAF (paroxysmal atrial fibrillation) (HCC)    Palpitations    PMB (postmenopausal bleeding)    Restless leg syndrome    Sleep apnea    has no cpap   Symptomatic bradycardia    Vitamin D deficiency     Patient Active Problem List   Diagnosis Date Noted   History of total knee arthroplasty,  right 02/25/2021   Intestinal metaplasia of gastric cardia 11/07/2020   Mild intermittent asthma without complication 84/66/5993   Shortness of breath 08/08/2020   Vaginal candidiasis 08/08/2020   Atherosclerosis of aorta ([place]) 07/09/2020   Acute diverticulitis 05/13/2020   Fever blister 10/15/2019   Cerumen in auditory canal on examination 10/15/2019   Bilateral hearing loss 10/15/2019   Need for vaccination against Streptococcus pneumoniae using pneumococcal conjugate vaccine 7 10/15/2019   Encounter for screening mammogram for malignant neoplasm of breast 10/15/2019   Colon cancer screening 10/15/2019   Weakness 09/02/2019   Headache disorder 09/02/2019   Stage 3b chronic kidney disease ([place]) 08/10/2019   Elevated erythrocyte sedimentation rate 11/02/2018   Lumbar spondylosis 11/02/2018   PVC's (premature ventricular contractions) 10/18/2018   Primary osteoarthritis of right knee 09/15/2018   Encounter for pre-operative examination 09/15/2018   Obstructive sleep apnea 09/15/2018   Calculus of gallbladder without cholecystitis without obstruction 09/15/2018   Calculus of gallbladder with cholecystitis without biliary obstruction 01/27/2018   Restless leg syndrome 01/27/2018   Generalized abdominal pain 01/11/2018   Cyst of pancreas 01/11/2018   Other specified disorders of kidney and ureter 01/11/2018   Primary insomnia 01/11/2018   Paroxysmal atrial fibrillation ([place]) 11/12/2017   Essential hypertension 10/25/2017   Mixed hyperlipidemia 10/25/2017  Low back pain at multiple sites 10/25/2017   Dysuria 10/25/2017   Encounter for general adult medical examination with abnormal findings 10/25/2017   Vitamin D deficiency 10/25/2017   Near syncope    Symptomatic bradycardia 10/19/2017   Carpal tunnel syndrome 10/12/2017   Primary osteoarthritis of left knee 07/09/2017   History of total knee arthroplasty 07/06/2017   Chest pain 06/29/2017   Postop check 02/25/2017    Impingement syndrome of shoulder region 01/20/2017   Neck pain 01/20/2017   Obesity (BMI 35.0-39.9 without comorbidity) 07/15/2016   Endometrial polyp 07/15/2016   Morbid obesity ([place]) 07/15/2016   Family history of breast cancer in first degree relative 07/15/2016   Family history of ovarian cancer 07/15/2016   Knee pain 07/04/2016   Bilateral leg pain 06/24/2016    Past Surgical History:  Procedure Laterality Date   BIOPSY  10/31/2019   Procedure: BIOPSY;  Surgeon: Irving Copas., MD;  Location: [place];  Service: Gastroenterology;;   CARDIAC CATHETERIZATION     CHOLECYSTECTOMY     COLONOSCOPY WITH PROPOFOL N/A 06/13/2019   Procedure: COLONOSCOPY WITH PROPOFOL;  Surgeon: [name], MD;  Location: [facility] ENDOSCOPY;  Service: Gastroenterology;  Laterality: N/A;   COLONOSCOPY WITH PROPOFOL N/A 08/12/2019   Procedure: COLONOSCOPY WITH PROPOFOL;  Surgeon: [name], MD;  Location: [facility] ENDOSCOPY;  Service: Gastroenterology;  Laterality: N/A;   COLONOSCOPY WITH PROPOFOL N/A 09/19/2019   Procedure: COLONOSCOPY WITH PROPOFOL;  Surgeon: [name], MD;  Location: [facility] ENDOSCOPY;  Service: Gastroenterology;  Laterality: N/A;   DILATION AND CURETTAGE OF UTERUS     ESOPHAGOGASTRODUODENOSCOPY (EGD) WITH PROPOFOL N/A 06/13/2019   Procedure: ESOPHAGOGASTRODUODENOSCOPY (EGD) WITH PROPOFOL;  Surgeon: [name], MD;  Location: [facility] ENDOSCOPY;  Service: Gastroenterology;  Laterality: N/A;  Pt will go for COVID test on 50-3-88 as she uses public transportation and will be in the area on that day.    ESOPHAGOGASTRODUODENOSCOPY (EGD) WITH PROPOFOL N/A 10/31/2019   Procedure: ESOPHAGOGASTRODUODENOSCOPY (EGD) WITH PROPOFOL;  Surgeon: Rush Landmark Telford Nab., MD;  Location: [place];  Service: Gastroenterology;  Laterality: N/A;   HEMOSTASIS CLIP PLACEMENT  10/31/2019   Procedure: HEMOSTASIS CLIP PLACEMENT;  Surgeon: Irving Copas., MD;  Location: [place];  Service:  Gastroenterology;;   HERNIA REPAIR     HYSTEROSCOPY WITH D & C N/A 02/16/2017   Procedure: DILATATION AND CURETTAGE /HYSTEROSCOPY;  Surgeon: Brayton Mars, MD;  Location: [facility] ORS;  Service: Gynecology;  Laterality: N/A;   JOINT REPLACEMENT Left    knee   kidney removal Left    KIDNEY SURGERY Left 1979   left kidney removed   LEFT HEART CATH AND CORONARY ANGIOGRAPHY N/A 10/21/2017   Procedure: LEFT HEART CATH AND CORONARY ANGIOGRAPHY;  Surgeon: Teodoro Spray, MD;  Location: [place] CV LAB;  Service: Cardiovascular;  Laterality: N/A;   POLYPECTOMY  10/31/2019   Procedure: POLYPECTOMY;  Surgeon: Rush Landmark Telford Nab., MD;  Location: [place];  Service: Gastroenterology;;   TOTAL KNEE ARTHROPLASTY Left 07/06/2017   Procedure: TOTAL KNEE ARTHROPLASTY;  Surgeon: Lovell Sheehan, MD;  Location: [facility];  Service: Orthopedics;  Laterality: Left;   TOTAL KNEE ARTHROPLASTY Right 02/25/2021   Procedure: TOTAL KNEE ARTHROPLASTY;  Surgeon: Lovell Sheehan, MD;  Location: [facility];  Service: Orthopedics;  Laterality: Right;   UPPER ESOPHAGEAL ENDOSCOPIC ULTRASOUND (EUS) N/A 10/31/2019   Procedure: UPPER ESOPHAGEAL ENDOSCOPIC ULTRASOUND (EUS);  Surgeon: Irving Copas., MD;  Location: [place];  Service: Gastroenterology;  Laterality: N/A;   WRIST  ARTHROSCOPY Left 1970s   XI ROBOTIC ASSISTED VENTRAL HERNIA N/A 12/01/2019   Procedure: XI ROBOTIC ASSISTED VENTRAL HERNIA;  Surgeon: Jules Husbands, MD;  Location: [facility] OR[facility];  Service: General;  Laterality: N/A;    Prior to Admission medications   Medication Sig Start Date End Date Taking? Authorizing Provider  albuterol (VENTOLIN HFA) 108 (90 Base) MCG/ACT inhaler Inhale 2 puffs into the lungs every 4 (four) hours as needed for wheezing or shortness of breath. 07/09/20   Ronnell Freshwater, NP  amLODipine (NORVASC) 5 MG tablet Take 1 tablet (5 mg total) by mouth 2 (two) times daily. 01/02/21   Lavera Guise, MD  aspirin 81 MG chewable  tablet Chew 1 tablet (81 mg total) by mouth 2 (two) times daily. 02/28/21   Carlynn Spry, PA-C  carvedilol (COREG) 6.25 MG tablet Take 1 tablet (6.25 mg total) by mouth 2 (two) times daily with a meal. 12/17/20   Lavera Guise, MD  Cholecalciferol (VITAMIN D) 50 MCG (2000 UT) tablet Take 2,000 Units by mouth in the morning and at bedtime.     [provider]  diclofenac Sodium (VOLTAREN) 1 % GEL Apply 2 g topically daily as needed (Knee pain).    [provider]  docusate sodium (COLACE) 100 MG capsule Take 1 capsule (100 mg total) by mouth 2 (two) times daily. 02/28/21   Carlynn Spry, PA-C  ezetimibe (ZETIA) 10 MG tablet Take 1 tablet (10 mg total) by mouth daily. Patient taking differently: Take 10 mg by mouth every morning. 07/09/20   Ronnell Freshwater, NP  flecainide (TAMBOCOR) 50 MG tablet Take 50 mg by mouth as needed.    [provider]  gabapentin (NEURONTIN) 100 MG capsule Take 200 mg by mouth 2 (two) times daily. 02/20/21   [provider]  hydrALAZINE (APRESOLINE) 25 MG tablet Take 1 tablet (25 mg total) by mouth 3 (three) times daily. For HTN 12/17/20   Lavera Guise, MD  HYDROcodone-acetaminophen [facility]) 7.5-325 MG tablet Take 1 tablet by mouth every 4 (four) hours as needed for moderate pain (pain score 7-10). 02/28/21   Carlynn Spry, PA-C  methocarbamol (ROBAXIN) 500 MG tablet Take 1 tablet (500 mg total) by mouth 4 (four) times daily. 02/28/21   Carlynn Spry, PA-C  orlistat (ALLI) 60 MG capsule Take 60 mg by mouth 3 (three) times daily with meals.    [provider]  rOPINIRole (REQUIP) 0.5 MG tablet Take 0.5-1 mg by mouth 2 (two) times daily as needed (pain).    [provider]    Allergies Enalapril, Lisinopril, and Ace inhibitors  Family History  Problem Relation Age of Onset   Ovarian cancer Mother    Hypertension Father    Diabetes Father    Cancer Father    Breast cancer Sister    Diabetes Sister     Social  History Social History   Tobacco Use   Smoking status: Never   Smokeless tobacco: Never  Vaping Use   Vaping Use: Never used  Substance Use Topics   Alcohol use: Yes    Alcohol/week: 2.0 standard drinks    Types: 2 Standard drinks or equivalent per week    Comment: occasionally   Drug use: Yes    Frequency: 3.0 times per week    Types: Marijuana    Comment: 07/09/20 Pt states she uses for leg pain: marijuana; states no longer uses cocaine    Review of Systems  Review of Systems  Constitutional:  Negative for chills and fever.  HENT:  Negative for sore throat.   Eyes:  Negative for pain.  Respiratory:  Negative for cough and stridor.   Cardiovascular:  Negative for chest pain.  Gastrointestinal:  Negative for vomiting.  Genitourinary:  Negative for dysuria.  Musculoskeletal:  Positive for joint pain (R knee, radiating towards R hip).  Skin:  Negative for rash.  Neurological:  Negative for seizures, loss of consciousness and headaches.  Psychiatric/Behavioral:  Negative for suicidal ideas.   All other systems reviewed and are negative.    ____________________________________________   PHYSICAL EXAM:  VITAL SIGNS: ED Triage Vitals  Enc Vitals Group     BP 03/22/21 1700 (!) 159/97     Pulse Rate 03/22/21 1700 87     Resp 03/22/21 1700 16     Temp 03/22/21 1704 98.7 F (37.1 C)     Temp Source 03/22/21 1704 Oral     SpO2 03/22/21 1700 97 %     Weight 03/22/21 1700 227 lb 1.2 oz (103 kg)     Height 03/22/21 1700 5' 5"  (1.651 m)     Head Circumference --      Peak Flow --      Pain Score 03/22/21 1700 10     Pain Loc --      Pain Edu? --      Excl. in [place]? --    Vitals:   03/22/21 1700 03/22/21 1704  BP: (!) 159/97   Pulse: 87   Resp: 16   Temp:  98.7 F (37.1 C)  SpO2: 97%    Physical Exam Vitals and nursing note reviewed.  Constitutional:      General: She is not in acute distress.    Appearance: She is well-developed. She is obese.  HENT:     Head:  Normocephalic and atraumatic.     Right Ear: External ear normal.     Left Ear: External ear normal.     Nose: Nose normal.  Eyes:     Conjunctiva/sclera: Conjunctivae normal.  Cardiovascular:     Rate and Rhythm: Normal rate and regular rhythm.     Heart sounds: No murmur heard. Pulmonary:     Effort: Pulmonary effort is normal. No respiratory distress.     Breath sounds: Normal breath sounds.  Abdominal:     Palpations: Abdomen is soft.     Tenderness: There is no abdominal tenderness.  Musculoskeletal:     Cervical back: Neck supple.  Skin:    General: Skin is warm and dry.     Capillary Refill: Capillary refill takes less than 2 seconds.  Neurological:     Mental Status: She is alert and oriented to person, place, and time.  Psychiatric:        Mood and Affect: Mood normal.    Sensation is intact to light touch throughout the bilateral lower extremities.  2+ DP pulses.  Patient has no significant pain on passive range of motion at the right knee.  She states feels like it is rating up into her right hip.  She does not have any focal areas of tenderness on the hip or ankle.  She does have some erythema and edema over the right knee primarily anterior and laterally.  Left knee is fairly unremarkable. ____________________________________________   LABS (all labs ordered are listed, but only abnormal results are displayed)  Labs Reviewed  COMPREHENSIVE METABOLIC PANEL - Abnormal; Notable for the following components:      Result  Value   BUN 28 (*)    Creatinine, Ser 1.32 (*)    Calcium 8.4 (*)    Albumin 3.2 (*)    GFR, Estimated 43 (*)    All other components within normal limits  CBC WITH DIFFERENTIAL/PLATELET - Abnormal; Notable for the following components:   RBC 3.76 (*)    Hemoglobin 11.3 (*)    HCT 35.7 (*)    All other components within normal limits  SEDIMENTATION RATE - Abnormal; Notable for the following components:   Sed Rate 63 (*)    All other components  within normal limits  BODY FLUID CULTURE W GRAM STAIN  GLUCOSE, BODY FLUID OTHER            PROTEIN, BODY FLUID (OTHER)  SYNOVIAL CELL COUNT + DIFF, W/ CRYSTALS   ____________________________________________  EKG  ____________________________________________  RADIOLOGY  ED MD interpretation: Plain film of the right hip and right knee is unremarkable.  Official radiology report(s): DG Knee Complete 4 Views Right  Result Date: 03/22/2021 CLINICAL DATA:  Right knee pain and swelling after surgery on July 25th. No fever. EXAM: RIGHT KNEE - COMPLETE 4+ VIEW COMPARISON:  02/25/2021 FINDINGS: Postoperative changes with right total knee arthroplasty. Components appear well seated although oblique positioning on the cross-table lateral limits evaluation. No evidence of acute fracture or dislocation. No focal bone lesion or bone destruction. No significant effusions. Soft tissues are unremarkable. IMPRESSION: Postoperative right total knee arthroplasty. Components appear well seated. No acute bony changes identified. Electronically Signed   By: Lucienne Capers M.D.   On: 03/22/2021 17:45   DG Hip Unilat W or Wo Pelvis 2-3 Views Right  Result Date: 03/22/2021 CLINICAL DATA:  Right knee and hip pain. Right leg is visually red and swollen. EXAM: DG HIP (WITH OR WITHOUT PELVIS) 2-3V RIGHT COMPARISON:  None. FINDINGS: Pelvis appears intact. SI joints and symphysis pubis are not displaced. Mild degenerative changes in the hips. No evidence of acute fracture or dislocation. No focal bone lesion or bone destruction. Soft tissues are unremarkable. IMPRESSION: Mild degenerative changes in the right hip. No acute bony abnormalities. Electronically Signed   By: Lucienne Capers M.D.   On: 03/22/2021 18:34    ____________________________________________   PROCEDURES  Procedure(s) performed (including Critical Care):  .Joint Aspiration/Arthrocentesis  Date/Time: 03/22/2021 7:15 PM Performed by: Lucrezia Starch, MD Authorized by: Lucrezia Starch, MD   Consent:    Consent obtained:  Verbal   Consent given by:  Patient   Risks, benefits, and alternatives were discussed: yes     Risks discussed:  Bleeding, pain, infection, nerve damage and incomplete drainage   Alternatives discussed:  No treatment Universal protocol:    Patient identity confirmed:  Verbally with patient Location:    Location:  Knee   Knee:  R knee Anesthesia:    Anesthesia method:  Local infiltration   Local anesthetic:  Lidocaine 1% WITH epi Procedure details:    Preparation: Patient was prepped and draped in usual sterile fashion     Needle gauge:  18 G   Ultrasound guidance: no     Approach:  Lateral   Aspirate amount:  5   Aspirate characteristics:  Serous and blood-tinged   Steroid injected: no     Specimen collected: yes   Post-procedure details:    Dressing:  Adhesive bandage   Procedure completion:  Tolerated well, no immediate complications   ____________________________________________   INITIAL IMPRESSION / ASSESSMENT AND PLAN / ED COURSE  Patient presents with above to history exam for assessment of some acute on chronic nontraumatic pain in her right knee after recent arthroplasty.  On arrival she is hypertensive with BP of 159/97 with otherwise stable vital signs on room air.  On exam she does have some erythema tenderness and edema as well as small effusion right knee.  She is otherwise neurovascular intact distally.  She endorses fairly severe pain rating to her hip as well.  Differential includes cellulitis, septic joint, not septic arthritis and DVT.  No history or exam features to suggest recent trauma.  Pain films of the right hip and knee show no acute fracture or dislocation.  CBC shows no leukocytosis or acute anemia.  CMP shows no significant electrolyte or metabolic derangements.  ESR is elevated at 63.  Discussed with patient's orthopedist Dr. Harlow Mares who agreed ED  arthrocentesis would be reasonable to rule out septic joint.  This was performed per procedure note above.  Care patient signed over to [place] at approximately 8 PM.  Is to follow-up right lower extremity ultrasound to rule out DVT as well as arthrocentesis fluids.  If there is evidence of a septic joint plan is to start IV antibiotics and admit after notifying Dr. Prince Rome.  There is no evidence of septic joint patient will likely be able to go home with close outpatient orthopedic follow-up and plan for p.o. antibiotics for possible cellulitis.      ____________________________________________   FINAL CLINICAL IMPRESSION(S) / ED DIAGNOSES  Final diagnoses:  Acute pain of right knee    Medications  oxyCODONE (Oxy IR/ROXICODONE) immediate release tablet 5 mg (5 mg Oral Given 03/22/21 1812)  lidocaine-EPINEPHrine (XYLOCAINE W/EPI) 2 %-1:100000 (with pres) injection 20 mL (20 mLs Intradermal Given by Other 03/22/21 1838)     ED Discharge Orders     None        Note:  This document was prepared using Dragon voice recognition software and may include unintentional dictation errors.    Lucrezia Starch, MD 03/22/21 [place]

## 2021-03-22 NOTE — ED Provider Notes (Signed)
  Physical Exam  BP (!) 159/97 (BP Location: Left Arm)   Pulse 87   Temp 98.7 F (37.1 C) (Oral)   Resp 16   Ht $R'5\' 5"'fq$  (1.651 m)   Wt 103 kg   LMP 01/26/2017 Comment: age 70  SpO2 97%   BMI 37.79 kg/m   Physical Exam Constitutional:      Appearance: Normal appearance.  HENT:     Head: Normocephalic and atraumatic.  Musculoskeletal:     Comments: Right lower extremity with healed total knee incision, no warmth or redness but swelling present.  Band-Aid intact from aspiration site.  Knee consistent with normal postoperative total knee for 3 weeks postop.  Neurological:     Mental Status: She is alert.  Psychiatric:        Mood and Affect: Mood normal.        Thought Content: Thought content normal.    ED Course/Procedures     Procedures  MDM  70 year old female 3 weeks postop right total knee arthroplasty.  She developed acute pain and swelling in the knee today.  Patient underwent work-up consisting of basic labs showing no elevated white count and slightly elevated ESR at 63.  Ultrasound negative for DVT.  Case discussed with orthopedist, right knee was aspirated and fluid sent for cell count and culture.  Patient with slightly elevated WBCs and synovial fluid, discussed all lab work, fluid cell count with orthopedist to recommended outpatient follow-up in the clinic on Monday.  Patient will continue with rest, ice, elevation and avoid excessive ambulation.  We will continue with current pain regimen and will follow-up with orthopedist Monday.  She understands signs symptoms return to the ER for.  Patient stable and ready for discharge.       Duanne Guess, PA-C 03/22/21 2206    Lucrezia Starch, MD 03/23/21 1057

## 2021-03-22 NOTE — Discharge Instructions (Addendum)
Continue with ice to the right knee.  Work on range of motion exercises.  Avoid excessive walking to prevent swelling throughout the knee.  Continue with current pain regimen prescribed by orthopedist.  Call orthopedic office on Monday to schedule follow-up for recheck to make sure pain and swelling is improving.

## 2021-03-22 NOTE — Patient Instructions (Addendum)
Visit Information   Goals Addressed             This Visit's Progress    Track and Manage My Blood Pressure-Hypertension       Timeframe:  Long-Range Goal Priority:  High Start Date:  03/22/21                           Expected End Date:   09/22/21                    Follow Up Date 07/03/21    - check blood pressure 3 times per week - choose a place to take my blood pressure (home, clinic or office, retail store) - write blood pressure results in a log or diary    Why is this important?   You won't feel high blood pressure, but it can still hurt your blood vessels.  High blood pressure can cause heart or kidney problems. It can also cause a stroke.  Making lifestyle changes like losing a little weight or eating less salt will help.  Checking your blood pressure at home and at different times of the day can help to control blood pressure.  If the doctor prescribes medicine remember to take it the way the doctor ordered.  Call the office if you cannot afford the medicine or if there are questions about it.     Notes:        Patient Care Plan: General Pharmacy (Adult)     Problem Identified: HTN, Afib, Osteoarthritis, HLD, Insomnia, RLS   Priority: High  Onset Date: 03/22/2021     Long-Range Goal: Patient-Specific Goal   Start Date: 03/22/2021  Expected End Date: 09/22/2021  This Visit's Progress: On track  Priority: High  Note:   Current Barriers:  Unable to independently monitor therapeutic efficacy Suboptimal therapeutic regimen for HLD  Pharmacist Clinical Goal(s):  Patient will verbalize ability to afford treatment regimen achieve control of LDL as evidenced by lipid panel through collaboration with PharmD and provider.   Interventions: 1:1 collaboration with Whitney Guise, MD regarding development and update of comprehensive plan of care as evidenced by provider attestation and co-signature Inter-disciplinary care team collaboration (see longitudinal plan of  care) Comprehensive medication review performed; medication list updated in electronic medical record  Hypertension (BP goal <130/80) -Controlled -Current treatment: Amlodipine '5mg'$  BID Carvedilol 6.'25mg'$  BID Hydralazine '25mg'$  TID -Medications previously tried: Toprol XL  -Current home readings: 144/77  -Current exercise habits: rehab for knee, some walking -Denies hypotensive/hypertensive symptoms -Educated on BP goals and benefits of medications for prevention of heart attack, stroke and kidney damage; Importance of home blood pressure monitoring; Symptoms of hypotension and importance of maintaining adequate hydration; -Counseled to monitor BP at home a few times per week, document, and provide log at future appointments -Recommended to continue current medication  Hyperlipidemia: (LDL goal < 70) -Uncontrolled -Current treatment: Zetia '10mg'$  daily -Medications previously tried: Atorvastatin (cramps)   -Educated on Cholesterol goals;  Benefits of statin for ASCVD risk reduction; Importance of limiting foods high in cholesterol; -ASCVD risk score 16.3% - patient candidate for moderate intensity statin -Old notes Whitney [name] mentions crestor '5mg'$  twice weekly due to past history of intolerance. -patient is agreeable to try this plan -Recommended to continue current medication Will consult with PCP on addition of Crestor '5mg'$  twice weekly.  Atrial Fibrillation (Goal: prevent stroke and major bleeding) -Controlled -Current treatment: Rate control: Flecainide '50mg'$  prn,  Carvedilol 6.'25mg'$  BID Anticoagulation: none noted -Medications previously tried: none noted -Home BP and HR readings: "controlled"  -Counseled on increased risk of stroke due to Afib and benefits of anticoagulation for stroke prevention; bleeding risk associated with Eliquis and importance of self-monitoring for signs/symptoms of bleeding; Afib action plan reviewed -Recommended to continue current medication  RLS  (Goal: Minimize symptoms) -Controlled -Current treatment  Ropinorole 0.'5mg'$  BID Gabapentin '100mg'$  two capsules po BID -Medications previously tried: none noted -Patient recently had knee replacement and says her legs have been bothering her more since then.  Not bothering her sleep and she is taking the meds with no concern.  -Recommended to continue current medication  Osteoarthritis (Goal: Control pain) -Not ideally controlled -Current treatment  Norco 5/'325mg'$  q4h prn pain -Medications previously tried: none noted -Patient with recent total knee replacement - she is in a lot of pain today which shes says is more than her usual pain. -Discussed getting ahead of the pain with medication  She only takes Norco when pain is really severe. -Recommended to continue current medication  Patient Goals/Self-Care Activities Patient will:  - take medications as prescribed focus on medication adherence by pill counts check blood pressure periodically, document, and provide at future appointments  Follow Up Plan: The care management team will reach out to the patient again over the next 120 days.       Whitney [name] was given information about Chronic Care Management services today including:  CCM service includes personalized support from designated clinical staff supervised by her physician, including individualized plan of care and coordination with other care providers 24/7 contact phone numbers for assistance for urgent and routine care needs. Standard insurance, coinsurance, copays and deductibles apply for chronic care management only during months in which we provide at least 20 minutes of these services. Most insurances cover these services at 100%, however patients may be responsible for any copay, coinsurance and/or deductible if applicable. This service may help you avoid the need for more expensive face-to-face services. Only one practitioner may furnish and bill the service in a calendar  month. The patient may stop CCM services at any time (effective at the end of the month) by phone call to the office staff.  Patient agreed to services and verbal consent obtained.   The patient verbalized understanding of instructions, educational materials, and care plan provided today and agreed to receive a mailed copy of patient instructions, educational materials, and care plan.  Telephone follow up appointment with pharmacy team member scheduled for: 4 months  Edythe Clarity, [place]

## 2021-03-22 NOTE — ED Triage Notes (Signed)
Pt to ED reporting right sided knee pain with a knee replacement on July 25th. Knee is swollen and red at this time and warm to the touch. No fevers at home.

## 2021-03-22 NOTE — Progress Notes (Signed)
Chronic Care Management Pharmacy Note  03/22/2021 Name:  Whitney [name] MRN:  [id number] DOB:  12/23/1950  Summary: Initial visit with PharmD.  Patient with elevated LDL on zetia only.  Has tried atorvastatin in the past with no luck.  All meds reviewed and updated and she is taking correctly.  Recommendations/Changes made from today's visit: Patient was agreeable to Rosuvastatin 62m twice weekly as mentioned before in Dr. KLaurelyn Sicklenotes so I would recommend this for patient with Afib for CV risk reduction.  Plan: FU 4 months, sooner to assess statin tolerance   Subjective: Whitney HUBNERis an 70y.o. year old female who is a primary patient of KHumphrey Rolls FTimoteo Gaul MD.  The CCM team was consulted for assistance with disease management and care coordination needs.    Engaged with patient face to face for initial visit in response to provider referral for pharmacy case management and/or care coordination services.   Consent to Services:  The patient was given the following information about Chronic Care Management services today, agreed to services, and gave verbal consent: 1. CCM service includes personalized support from designated clinical staff supervised by the primary care provider, including individualized plan of care and coordination with other care providers 2. 24/7 contact phone numbers for assistance for urgent and routine care needs. 3. Service will only be billed when office clinical staff spend 20 minutes or more in a month to coordinate care. 4. Only one practitioner may furnish and bill the service in a calendar month. 5.The patient may stop CCM services at any time (effective at the end of the month) by phone call to the office staff. 6. The patient will be responsible for cost sharing (co-pay) of up to 20% of the service fee (after annual deductible is met). Patient agreed to services and consent obtained.  Patient Care Team: KLavera Guise MD as PCP - General (Internal  Medicine) DEdythe Clarity [facility]Reg[facility] LLCas Pharmacist (Pharmacist)  Recent office visits: 01/01/21 Dr. KHumphrey RollsFor COVID/follow-up. No medication changes.   12/17/20 Dr. KHumphrey RollsFor follow-up. No medication changes.  11/12/20  HLuiz Ochoa NP. For medicare wellness. No medication changes.    Recent consult visits:  02/20/21 Physical Med  Meeler, WLoree Fee NP  For pain. No medication changes. 01/29/21 AJonathon Bellows MD For follow-up. No medications. No medication changes.  12/11/20 Cardiology Fath, KAloha Gell MD. For follow-up. No medication changes.  11/26/20 GJoellyn Rued MD. For acute diverticulitis. No medication changes.  11/22/20 GJoellyn Rued MD. For acute diverticulitis. STARTED Cipro 500 mg 2 times daily and Metrondazole 250 mg 3 times daily.  11/07/20 GJoellyn Rued MD. For abdominal pain. No medication changes.  11/02/20 Physical Med  Meeler, WLoree Fee NP  For pain. No medication changes. 10/25/20 OLesia Sago MD. No medication changes.  10/09/20 Nephrology LAnthonette Legato MD  For follow-up. No medication changes.  10/02/20 Orthopedic Surgery BLovell Sheehan    [facility]. STOPPED Acetaminophen and Asprin 81 tablet.    Objective:  Lab Results  Component Value Date   CREATININE 1.14 (H) 02/26/2021   BUN 19 02/26/2021   GFRNONAA 52 (L) 02/26/2021   GFRAA 54 (L) 11/18/2019   NA 141 02/26/2021   K 4.6 02/26/2021   CALCIUM 8.3 (L) 02/26/2021   CO2 27 02/26/2021   GLUCOSE 97 02/26/2021    Lab Results  Component Value Date/Time   HGBA1C 5.7 (  H) 11/18/2019 10:05 AM   HGBA1C 5.8 (H) 09/27/2018 10:36 AM    Last diabetic Eye exam: No results found for: HMDIABEYEEXA  Last diabetic Foot exam: No results found for: HMDIABFOOTEX   Lab Results  Component Value Date   CHOL 225 (H) 11/18/2019   HDL 59 11/18/2019   LDLCALC 148 (H) 11/18/2019   TRIG 104 11/18/2019    Hepatic  Function Latest Ref Rng & Units 06/26/2020 05/25/2020 05/13/2020  Total Protein 6.5 - 8.1 g/dL 6.8 6.7 7.4  Albumin 3.5 - 5.0 g/dL 3.3(L) 3.1(L) 3.2(L)  AST 15 - 41 U/L 15 40 13(L)  ALT 0 - 44 U/L 12 29 11   Alk Phosphatase 38 - 126 U/L 69 70 68  Total Bilirubin 0.3 - 1.2 mg/dL 0.6 0.6 0.6  Bilirubin, Direct 0.0 - 0.2 mg/dL - - <0.1    Lab Results  Component Value Date/Time   TSH 2.060 11/18/2019 10:05 AM   TSH 1.730 09/27/2018 10:36 AM   FREET4 0.91 11/18/2019 10:05 AM   FREET4 0.90 09/27/2018 10:36 AM    CBC Latest Ref Rng & Units 02/27/2021 02/26/2021 02/21/2021  WBC 4.0 - 10.5 K/uL 6.8 6.9 5.0  Hemoglobin 12.0 - 15.0 g/dL 10.9(L) 11.6(L) 12.2  Hematocrit 36.0 - 46.0 % 34.1(L) 35.9(L) 37.5  Platelets 150 - 400 K/uL 174 191 204    Lab Results  Component Value Date/Time   VD25OH 32.3 11/18/2019 10:05 AM    Clinical ASCVD: Yes  The 10-year ASCVD risk score Mikey Bussing DC Jr., et al., 2013) is: 16.3%   Values used to calculate the score:     Age: 70 years     Sex: Female     Is Non-Hispanic African American: Yes     Diabetic: No     Tobacco smoker: No     Systolic Blood Pressure: 250 mmHg     Is BP treated: Yes     HDL Cholesterol: 59 mg/dL     Total Cholesterol: 225 mg/dL    Depression screen [facility] 2/9 12/17/2020 11/12/2020 07/09/2020  Decreased Interest 0 0 0  Down, Depressed, Hopeless 0 0 0  PHQ - 2 Score 0 0 0  Altered sleeping - - -  Tired, decreased energy - - -  Change in appetite - - -  Feeling bad or failure about yourself  - - -  Trouble concentrating - - -  Moving slowly or fidgety/restless - - -  Suicidal thoughts - - -  PHQ-9 Score - - -  Some recent data might be hidden     Social History   Tobacco Use  Smoking Status Never  Smokeless Tobacco Never   BP Readings from Last 3 Encounters:  03/19/21 (!) 143/76  02/28/21 117/61  01/01/21 (!) 148/74   Pulse Readings from Last 3 Encounters:  03/19/21 81  02/28/21 77  01/01/21 74   Wt Readings from Last  3 Encounters:  02/25/21 227 lb (103 kg)  01/01/21 243 lb (110.2 kg)  12/21/20 244 lb (110.7 kg)   BMI Readings from Last 3 Encounters:  02/25/21 37.77 kg/m  01/01/21 40.44 kg/m  12/21/20 40.60 kg/m    Assessment/Interventions: Review of patient past medical history, allergies, medications, health status, including review of consultants reports, laboratory and other test data, was performed as part of comprehensive evaluation and provision of chronic care management services.   SDOH:  (Social Determinants of Health) assessments and interventions performed: Yes  Financial Resource Strain: Low Risk    Difficulty of  Paying Living Expenses: Not very hard    SDOH Screenings   Alcohol Screen: Low Risk    Last Alcohol Screening Score (AUDIT): 3  Depression (PHQ2-9): Low Risk    PHQ-2 Score: 0  Financial Resource Strain: Low Risk    Difficulty of Paying Living Expenses: Not very hard  Food Insecurity: Not on file  Housing: Not on file  Physical Activity: Not on file  Social Connections: Not on file  Stress: Not on file  Tobacco Use: Low Risk    Smoking Tobacco Use: Never   Smokeless Tobacco Use: Never  Transportation Needs: Not on file    CCM Care Plan  Allergies  Allergen Reactions   Enalapril Swelling   Lisinopril Swelling   Ace Inhibitors Swelling    And ARB    Medications Reviewed Today     Reviewed by Edythe Clarity, [facility] (Pharmacist) on 03/22/21 at [place] List Status: <None>   Medication Order Taking? Sig Documenting Provider Last Dose Status Informant  albuterol (VENTOLIN HFA) 108 (90 Base) MCG/ACT inhaler [id number] Yes Inhale 2 puffs into the lungs every 4 (four) hours as needed for wheezing or shortness of breath. Ronnell Freshwater, NP Taking Active Self  amLODipine (NORVASC) 5 MG tablet [id number] Yes Take 1 tablet (5 mg total) by mouth 2 (two) times daily. Lavera Guise, MD Taking Active   aspirin 81 MG chewable tablet [id number] Yes Chew 1 tablet (81  mg total) by mouth 2 (two) times daily. Carlynn Spry, PA-C Taking Active   carvedilol (COREG) 6.25 MG tablet [id number] Yes Take 1 tablet (6.25 mg total) by mouth 2 (two) times daily with a meal. Lavera Guise, MD Taking Active Self           Med Note Lucia Gaskins, CHERIE L   Fri Dec 21, 2020 11:19 AM)    Cholecalciferol (VITAMIN D) 50 MCG (2000 UT) tablet [id number] Yes Take 2,000 Units by mouth in the morning and at bedtime.  [provider] Taking Active Self  diclofenac Sodium (VOLTAREN) 1 % GEL [id number] Yes Apply 2 g topically daily as needed (Knee pain). [provider] Taking Active Self  docusate sodium (COLACE) 100 MG capsule [id number] Yes Take 1 capsule (100 mg total) by mouth 2 (two) times daily. Carlynn Spry, PA-C Taking Active   ezetimibe (ZETIA) 10 MG tablet [id number] Yes Take 1 tablet (10 mg total) by mouth daily.  Patient taking differently: Take 10 mg by mouth every morning.   Ronnell Freshwater, NP Taking Active            Med Note Rosana Hoes, Jonne Ply   Fri Feb 15, 2021  1:54 PM)    flecainide (TAMBOCOR) 50 MG tablet [id number] Yes Take 50 mg by mouth as needed. [provider] Taking Active   gabapentin (NEURONTIN) 100 MG capsule [id number] Yes Take 200 mg by mouth 2 (two) times daily. [provider] Taking Active   hydrALAZINE (APRESOLINE) 25 MG tablet [id number] Yes Take 1 tablet (25 mg total) by mouth 3 (three) times daily. For HTN Lavera Guise, MD Taking Active Self  HYDROcodone-acetaminophen [facility]) 7.5-325 MG tablet [id number] Yes Take 1 tablet by mouth every 4 (four) hours as needed for moderate pain (pain score 7-10). Carlynn Spry, PA-C Taking Active   methocarbamol (ROBAXIN) 500 MG tablet [id number] Yes Take 1 tablet (500 mg total) by mouth 4 (four) times daily. Carlynn Spry, PA-C Taking Active   orlistat (ALLI) 60 MG capsule [id number] Yes Take  60 mg by mouth 3 (three) times daily with meals. [provider] Taking Active    rOPINIRole (REQUIP) 0.5 MG tablet [id number] Yes Take 0.5-1 mg by mouth 2 (two) times daily as needed (pain). [provider] Taking Active             Patient Active Problem List   Diagnosis Date Noted   History of total knee arthroplasty, right 02/25/2021   Intestinal metaplasia of gastric cardia 11/07/2020   Mild intermittent asthma without complication 31/54/0086   Shortness of breath 08/08/2020   Vaginal candidiasis 08/08/2020   Atherosclerosis of aorta ([place]) 07/09/2020   Acute diverticulitis 05/13/2020   Fever blister 10/15/2019   Cerumen in auditory canal on examination 10/15/2019   Bilateral hearing loss 10/15/2019   Need for vaccination against Streptococcus pneumoniae using pneumococcal conjugate vaccine 7 10/15/2019   Encounter for screening mammogram for malignant neoplasm of breast 10/15/2019   Colon cancer screening 10/15/2019   Weakness 09/02/2019   Headache disorder 09/02/2019   Stage 3b chronic kidney disease ([place]) 08/10/2019   Elevated erythrocyte sedimentation rate 11/02/2018   Lumbar spondylosis 11/02/2018   PVC's (premature ventricular contractions) 10/18/2018   Primary osteoarthritis of right knee 09/15/2018   Encounter for pre-operative examination 09/15/2018   Obstructive sleep apnea 09/15/2018   Calculus of gallbladder without cholecystitis without obstruction 09/15/2018   Calculus of gallbladder with cholecystitis without biliary obstruction 01/27/2018   Restless leg syndrome 01/27/2018   Generalized abdominal pain 01/11/2018   Cyst of pancreas 01/11/2018   Other specified disorders of kidney and ureter 01/11/2018   Primary insomnia 01/11/2018   Paroxysmal atrial fibrillation ([place]) 11/12/2017   Essential hypertension 10/25/2017   Mixed hyperlipidemia 10/25/2017   Low back pain at multiple sites 10/25/2017   Dysuria 10/25/2017   Encounter for general adult medical examination with abnormal findings 10/25/2017   Vitamin D deficiency  10/25/2017   Near syncope    Symptomatic bradycardia 10/19/2017   Carpal tunnel syndrome 10/12/2017   Primary osteoarthritis of left knee 07/09/2017   History of total knee arthroplasty 07/06/2017   Chest pain 06/29/2017   Postop check 02/25/2017   Impingement syndrome of shoulder region 01/20/2017   Neck pain 01/20/2017   Obesity (BMI 35.0-39.9 without comorbidity) 07/15/2016   Endometrial polyp 07/15/2016   Morbid obesity ([place]) 07/15/2016   Family history of breast cancer in first degree relative 07/15/2016   Family history of ovarian cancer 07/15/2016   Knee pain 07/04/2016   Bilateral leg pain 06/24/2016    Immunization History  Administered Date(s) Administered   Fluad Quad(high Dose 65+) 05/17/2020   Influenza, High Dose Seasonal PF 07/11/2019   Moderna Sars-Covid-2 Vaccination 10/01/2019, 10/29/2019   Pneumococcal Conjugate-13 07/08/2020    Conditions to be addressed/monitored:  HTN, Afib, Osteoarthritis, HLD, Insomnia, RLS  Care Plan : General Pharmacy (Adult)  Updates made by Edythe Clarity, RPH since 03/22/2021 12:00 AM     Problem: HTN, Afib, Osteoarthritis, HLD, Insomnia, RLS   Priority: High  Onset Date: 03/22/2021     Long-Range Goal: Patient-Specific Goal   Start Date: 03/22/2021  Expected End Date: 09/22/2021  This Visit's Progress: On track  Priority: High  Note:   Current Barriers:  Unable to independently monitor therapeutic efficacy Suboptimal therapeutic regimen for HLD  Pharmacist Clinical Goal(s):  Patient will verbalize ability to afford treatment regimen achieve control of LDL as evidenced by lipid panel through collaboration with PharmD and provider.   Interventions: 1:1 collaboration with Lavera Guise, MD  regarding development and update of comprehensive plan of care as evidenced by provider attestation and co-signature Inter-disciplinary care team collaboration (see longitudinal plan of care) Comprehensive medication review  performed; medication list updated in electronic medical record  Hypertension (BP goal <130/80) -Controlled -Current treatment: Amlodipine 25m BID Carvedilol 6.290mBID Hydralazine 257mID -Medications previously tried: Toprol XL  -Current home readings: 144/77  -Current exercise habits: rehab for knee, some walking -Denies hypotensive/hypertensive symptoms -Educated on BP goals and benefits of medications for prevention of heart attack, stroke and kidney damage; Importance of home blood pressure monitoring; Symptoms of hypotension and importance of maintaining adequate hydration; -Counseled to monitor BP at home a few times per week, document, and provide log at future appointments -Recommended to continue current medication  Hyperlipidemia: (LDL goal < 70) -Uncontrolled -Current treatment: Zetia 11m47mily -Medications previously tried: Atorvastatin (cramps)   -Educated on Cholesterol goals;  Benefits of statin for ASCVD risk reduction; Importance of limiting foods high in cholesterol; -ASCVD risk score 16.3% - patient candidate for moderate intensity statin -Old notes Dr. KhanHumphrey Rollstions crestor 5mg 45mce weekly due to past history of intolerance. -patient is agreeable to try this plan -Recommended to continue current medication Will consult with PCP on addition of Crestor 5mg t12me weekly.  Atrial Fibrillation (Goal: prevent stroke and major bleeding) -Controlled -Current treatment: Rate control: Flecainide 50mg p59mCarvedilol 6.25mg BI32mticoagulation: none noted -Medications previously tried: none noted -Home BP and HR readings: "controlled"  -Counseled on increased risk of stroke due to Afib and benefits of anticoagulation for stroke prevention; bleeding risk associated with Eliquis and importance of self-monitoring for signs/symptoms of bleeding; Afib action plan reviewed -Recommended to continue current medication  RLS (Goal: Minimize  symptoms) -Controlled -Current treatment  Ropinorole 0.5mg BID 35mapentin 100mg two 58mules po BID -Medications previously tried: none noted -Patient recently had knee replacement and says her legs have been bothering her more since then.  Not bothering her sleep and she is taking the meds with no concern.  -Recommended to continue current medication  Osteoarthritis (Goal: Control pain) -Not ideally controlled -Current treatment  Norco 5/325mg q4h p75main -Medications previously tried: none noted -Patient with recent total knee replacement - she is in a lot of pain today which shes says is more than her usual pain. -Discussed getting ahead of the pain with medication  She only takes Norco when pain is really severe. -Recommended to continue current medication  Patient Goals/Self-Care Activities Patient will:  - take medications as prescribed focus on medication adherence by pill counts check blood pressure periodically, document, and provide at future appointments  Follow Up Plan: The care management team will reach out to the patient again over the next 120 days.        Medication Assistance: None required.  Patient affirms current coverage meets needs.  Compliance/Adherence/Medication fill history: Care Gaps: DEXA Scan [facility]'s [facility] is[id number] [facility] [phone] [id number] - [id number] [facility] [address] [place] [phone] box? Yes Pt endorses 100% compliance  We discussed: Benefits of medication synchronization, packaging and delivery as well as enhanced pharmacist oversight with [employer]. Patient decided to:  Patient wants to onboard in a month or two so will follow up  at that time.  Care Plan and  Follow Up Patient Decision:  Patient agrees to Care Plan and Follow-up.  Plan: The care management team will reach out to the patient again over the next 120 days.  Beverly Milch, PharmD Clinical Pharmacist [facility] [phone]

## 2021-03-22 NOTE — ED Triage Notes (Signed)
Pt comes  via [facility] with c/o right knee pain. Pt states deep sharp pain.

## 2021-03-25 DIAGNOSIS — S93401A Sprain of unspecified ligament of right ankle, initial encounter: Secondary | ICD-10-CM | POA: Diagnosis not present

## 2021-03-25 DIAGNOSIS — Z96651 Presence of right artificial knee joint: Secondary | ICD-10-CM | POA: Diagnosis not present

## 2021-03-25 LAB — GLUCOSE, BODY FLUID OTHER: Glucose, Body Fluid Other: 51 mg/dL

## 2021-03-25 LAB — PROTEIN, BODY FLUID (OTHER): Total Protein, Body Fluid Other: 4.4 g/dL

## 2021-03-26 ENCOUNTER — Other Ambulatory Visit: Payer: Self-pay

## 2021-03-26 ENCOUNTER — Encounter: Payer: Self-pay | Admitting: Nurse Practitioner

## 2021-03-26 ENCOUNTER — Ambulatory Visit (INDEPENDENT_AMBULATORY_CARE_PROVIDER_SITE_OTHER): Payer: Medicare Other | Admitting: Nurse Practitioner

## 2021-03-26 VITALS — BP 150/80 | HR 92 | Temp 97.2°F | Resp 16 | Ht 62.0 in | Wt 251.0 lb

## 2021-03-26 DIAGNOSIS — M47816 Spondylosis without myelopathy or radiculopathy, lumbar region: Secondary | ICD-10-CM

## 2021-03-26 DIAGNOSIS — Z23 Encounter for immunization: Secondary | ICD-10-CM | POA: Diagnosis not present

## 2021-03-26 DIAGNOSIS — Z780 Asymptomatic menopausal state: Secondary | ICD-10-CM

## 2021-03-26 LAB — BODY FLUID CULTURE W GRAM STAIN
Culture: NO GROWTH
Gram Stain: NONE SEEN
Special Requests: NORMAL

## 2021-03-26 MED ORDER — ZOSTER VAC RECOMB ADJUVANTED 50 MCG/0.5ML IM SUSR: 0.5000 mL | Freq: Once | INTRAMUSCULAR | 0 refills | Status: AC

## 2021-03-26 MED ORDER — TETANUS-DIPHTHERIA TOXOIDS TD 5-2 LFU IM INJ: 0.5000 mL | INJECTION | Freq: Once | INTRAMUSCULAR | 0 refills | Status: AC

## 2021-03-26 NOTE — Progress Notes (Signed)
[facility] [place], [place] [address]  Internal MEDICINE  Office Visit Note  Patient Name: Whitney [name]  [id number]  [id number]  Date of Service: 03/26/2021  Chief Complaint  Patient presents with   Follow-up    Wants a list of her doctors, pain right knee   Anxiety   Depression   Hypertension   Sleep Apnea   Quality Metric Gaps    Tdap, shingrix, dexa     HPI Whitney [name] presents for a follow up visit for recent surgery.  She had a right total knee replacement on 02/25/21. She has been going to physical therapy and the pain is starting to improve. She reports that she is doing well, the pain that she does have in her right knee is manageable. She is requesting a list of her specialists. She wants to see her rheumatologist and could not remember their name.  She wants to receive her tetanus and shingles vaccines. She is due for bone density scan.    Current Medication: Outpatient Encounter Medications as of 03/26/2021  Medication Sig   albuterol (VENTOLIN HFA) 108 (90 Base) MCG/ACT inhaler Inhale 2 puffs into the lungs every 4 (four) hours as needed for wheezing or shortness of breath.   amLODipine (NORVASC) 5 MG tablet Take 1 tablet (5 mg total) by mouth 2 (two) times daily.   aspirin 81 MG chewable tablet Chew 1 tablet (81 mg total) by mouth 2 (two) times daily.   carvedilol (COREG) 6.25 MG tablet Take 1 tablet (6.25 mg total) by mouth 2 (two) times daily with a meal.   Cholecalciferol (VITAMIN D) 50 MCG (2000 UT) tablet Take 2,000 Units by mouth in the morning and at bedtime.    diclofenac Sodium (VOLTAREN) 1 % GEL Apply 2 g topically daily as needed (Knee pain).   docusate sodium (COLACE) 100 MG capsule Take 1 capsule (100 mg total) by mouth 2 (two) times daily.   ezetimibe (ZETIA) 10 MG tablet Take 1 tablet (10 mg total) by mouth daily. (Patient taking differently: Take 10 mg by mouth every morning.)   flecainide (TAMBOCOR) 50 MG tablet Take 50 mg by  mouth as needed.   gabapentin (NEURONTIN) 100 MG capsule Take 200 mg by mouth 2 (two) times daily.   hydrALAZINE (APRESOLINE) 25 MG tablet Take 1 tablet (25 mg total) by mouth 3 (three) times daily. For HTN   HYDROcodone-acetaminophen (NORCO) 7.5-325 MG tablet Take 1 tablet by mouth every 4 (four) hours as needed for moderate pain (pain score 7-10).   methocarbamol (ROBAXIN) 500 MG tablet Take 1 tablet (500 mg total) by mouth 4 (four) times daily.   orlistat (ALLI) 60 MG capsule Take 60 mg by mouth 3 (three) times daily with meals.   rOPINIRole (REQUIP) 0.5 MG tablet Take 0.5-1 mg by mouth 2 (two) times daily as needed (pain).   [EXPIRED] tetanus & diphtheria toxoids, adult, (TENIVAC) 5-2 LFU injection Inject 0.5 mLs into the muscle once for 1 dose.   [EXPIRED] Zoster Vaccine Adjuvanted [facility]) injection Inject 0.5 mLs into the muscle once for 1 dose.   No facility-administered encounter medications on file as of 03/26/2021.    Surgical History: Past Surgical History:  Procedure Laterality Date   BIOPSY  10/31/2019   Procedure: BIOPSY;  Surgeon: Rush Landmark Telford Nab., MD;  Location: [place];  Service: Gastroenterology;;   CARDIAC CATHETERIZATION     CHOLECYSTECTOMY     COLONOSCOPY WITH PROPOFOL N/A 06/13/2019   Procedure: COLONOSCOPY WITH  PROPOFOL;  Surgeon: [name], MD;  Location: [facility] ENDOSCOPY;  Service: Gastroenterology;  Laterality: N/A;   COLONOSCOPY WITH PROPOFOL N/A 08/12/2019   Procedure: COLONOSCOPY WITH PROPOFOL;  Surgeon: [name], MD;  Location: [facility] ENDOSCOPY;  Service: Gastroenterology;  Laterality: N/A;   COLONOSCOPY WITH PROPOFOL N/A 09/19/2019   Procedure: COLONOSCOPY WITH PROPOFOL;  Surgeon: [name], MD;  Location: [facility] ENDOSCOPY;  Service: Gastroenterology;  Laterality: N/A;   DILATION AND CURETTAGE OF UTERUS     ESOPHAGOGASTRODUODENOSCOPY (EGD) WITH PROPOFOL N/A 06/13/2019   Procedure: ESOPHAGOGASTRODUODENOSCOPY (EGD) WITH PROPOFOL;  Surgeon: [name], MD;  Location: [facility] ENDOSCOPY;  Service: Gastroenterology;  Laterality: N/A;  Pt will go for COVID test on [id number] as she uses public transportation and will be in the area on that day.    ESOPHAGOGASTRODUODENOSCOPY (EGD) WITH PROPOFOL N/A 10/31/2019   Procedure: ESOPHAGOGASTRODUODENOSCOPY (EGD) WITH PROPOFOL;  Surgeon: Rush Landmark Telford Nab., MD;  Location: [place];  Service: Gastroenterology;  Laterality: N/A;   HEMOSTASIS CLIP PLACEMENT  10/31/2019   Procedure: HEMOSTASIS CLIP PLACEMENT;  Surgeon: Irving Copas., MD;  Location: [place];  Service: Gastroenterology;;   HERNIA REPAIR     HYSTEROSCOPY WITH D & C N/A 02/16/2017   Procedure: DILATATION AND CURETTAGE /HYSTEROSCOPY;  Surgeon: Brayton Mars, MD;  Location: [facility];  Service: Gynecology;  Laterality: N/A;   JOINT REPLACEMENT Left    knee   kidney removal Left    KIDNEY SURGERY Left 1979   left kidney removed   LEFT HEART CATH AND CORONARY ANGIOGRAPHY N/A 10/21/2017   Procedure: LEFT HEART CATH AND CORONARY ANGIOGRAPHY;  Surgeon: Teodoro Spray, MD;  Location: [place] CV LAB;  Service: Cardiovascular;  Laterality: N/A;   POLYPECTOMY  10/31/2019   Procedure: POLYPECTOMY;  Surgeon: Rush Landmark Telford Nab., MD;  Location: [place];  Service: Gastroenterology;;   TOTAL KNEE ARTHROPLASTY Left 07/06/2017   Procedure: TOTAL KNEE ARTHROPLASTY;  Surgeon: Lovell Sheehan, MD;  Location: [facility];  Service: Orthopedics;  Laterality: Left;   TOTAL KNEE ARTHROPLASTY Right 02/25/2021   Procedure: TOTAL KNEE ARTHROPLASTY;  Surgeon: Lovell Sheehan, MD;  Location: [facility];  Service: Orthopedics;  Laterality: Right;   UPPER ESOPHAGEAL ENDOSCOPIC ULTRASOUND (EUS) N/A 10/31/2019   Procedure: UPPER ESOPHAGEAL ENDOSCOPIC ULTRASOUND (EUS);  Surgeon: Irving Copas., MD;  Location: [place];  Service: Gastroenterology;  Laterality: N/A;   WRIST ARTHROSCOPY Left 1970s   XI ROBOTIC ASSISTED VENTRAL HERNIA  N/A 12/01/2019   Procedure: XI ROBOTIC ASSISTED VENTRAL HERNIA;  Surgeon: Jules Husbands, MD;  Location: [facility];  Service: General;  Laterality: N/A;    Medical History: Past Medical History:  Diagnosis Date   Abnormal Pap smear of cervix    Pt states she had colposcopy    Anxiety    Aortic atherosclerosis (HCC)    Arthritis    CAD (coronary artery disease)    Chronic kidney disease    s/p LEFT nephrectomy   Chronic lower back pain    COVID    Depression    Diverticulitis    Dyspnea    History of methicillin resistant staphylococcus aureus (MRSA) 12/21/2020   nasal swab pcr   Hx of unilateral nephrectomy 1979   Left Nephrectomy   Hypertension    Lower extremity edema    PAF (paroxysmal atrial fibrillation) (HCC)    Palpitations    PMB (postmenopausal bleeding)    Restless leg syndrome    Sleep apnea    has no cpap  Symptomatic bradycardia    Vitamin D deficiency     Family History: Family History  Problem Relation Age of Onset   Ovarian cancer Mother    Hypertension Father    Diabetes Father    Cancer Father    Breast cancer Sister    Diabetes Sister     Social History   Socioeconomic History   Marital status: Divorced    Spouse name: Not on file   Number of children: Not on file   Years of education: Not on file   Highest education level: Not on file  Occupational History   Not on file  Tobacco Use   Smoking status: Never   Smokeless tobacco: Never  Vaping Use   Vaping Use: Never used  Substance and Sexual Activity   Alcohol use: Yes    Alcohol/week: 2.0 standard drinks    Types: 2 Standard drinks or equivalent per week    Comment: occasionally   Drug use: Yes    Frequency: 3.0 times per week    Types: Marijuana    Comment: 07/09/20 Pt states she uses for leg pain: marijuana; states no longer uses cocaine   Sexual activity: Yes    Birth control/protection: None  Other Topics Concern   Not on file  Social History Narrative   Lives with  cousin.   Social Determinants of Health   Financial Resource Strain: Low Risk    Difficulty of Paying Living Expenses: Not very hard  Food Insecurity: Not on file  Transportation Needs: Not on file  Physical Activity: Not on file  Stress: Not on file  Social Connections: Not on file  Intimate Partner Violence: Not on file      Review of Systems  Constitutional:  Negative for chills, fatigue and unexpected weight change.  HENT:  Negative for congestion, rhinorrhea, sneezing and sore throat.   Eyes:  Negative for redness.  Respiratory:  Negative for cough, chest tightness and shortness of breath.   Cardiovascular:  Negative for chest pain and palpitations.  Gastrointestinal:  Negative for abdominal pain, constipation, diarrhea, nausea and vomiting.  Genitourinary:  Negative for dysuria and frequency.  Musculoskeletal:  Negative for arthralgias, back pain, joint swelling and neck pain.  Skin:  Negative for rash.  Neurological: Negative.  Negative for tremors and numbness.  Hematological:  Negative for adenopathy. Does not bruise/bleed easily.  Psychiatric/Behavioral:  Negative for behavioral problems (Depression), sleep disturbance and suicidal ideas. The patient is not nervous/anxious.    Vital Signs: BP (!) 150/80   Pulse 92   Temp (!) 97.2 F (36.2 C)   Resp 16   Ht '5\' 2"'$  (1.575 m)   Wt 251 lb (113.9 kg)   LMP 01/26/2017 Comment: age 70  SpO2 97%   BMI 45.91 kg/m    Physical Exam Vitals reviewed.  Constitutional:      General: She is not in acute distress.    Appearance: Normal appearance. She is obese. She is not ill-appearing.  HENT:     Head: Normocephalic and atraumatic.  Eyes:     Extraocular Movements: Extraocular movements intact.     Pupils: Pupils are equal, round, and reactive to light.  Cardiovascular:     Rate and Rhythm: Normal rate and regular rhythm.  Pulmonary:     Effort: Pulmonary effort is normal. No respiratory distress.  Neurological:      Mental Status: She is alert and oriented to person, place, and time.  Psychiatric:  Mood and Affect: Mood normal.        Behavior: Behavior normal.    Assessment/Plan: 1. Asymptomatic postmenopausal state BMD ordered to screen for osteoporosis.  - DG Bone Density; Future  2. Lumbar spondylosis Has previously seen a rheumatologist for back pain and multiple joint pain. She was seen by Dr. Annalee Genta. The provider's name and the clinic's phone number provided to patient.   3. Need for vaccination - Zoster Vaccine Adjuvanted [facility]) injection; Inject 0.5 mLs into the muscle once for 1 dose.  Dispense: 0.5 mL; Refill: 0 - tetanus & diphtheria toxoids, adult, (TENIVAC) 5-2 LFU injection; Inject 0.5 mLs into the muscle once for 1 dose.  Dispense: 0.5 mL; Refill: 0   General Counseling: Cartier verbalizes understanding of the findings of todays visit and agrees with plan of treatment. I have discussed any further diagnostic evaluation that may be needed or ordered today. We also reviewed her medications today. she has been encouraged to call the office with any questions or concerns that should arise related to todays visit.    Orders Placed This Encounter  Procedures   DG Bone Density    Meds ordered this encounter  Medications   Zoster Vaccine Adjuvanted [facility]) injection    Sig: Inject 0.5 mLs into the muscle once for 1 dose.    Dispense:  0.5 mL    Refill:  0   tetanus & diphtheria toxoids, adult, (TENIVAC) 5-2 LFU injection    Sig: Inject 0.5 mLs into the muscle once for 1 dose.    Dispense:  0.5 mL    Refill:  0    Return in about 3 months (around 06/26/2021) for F/U, med refill, Laiba Fuerte PCP.   Total time spent:20 Minutes Time spent includes review of chart, medications, test results, and follow up plan with the patient.   [place] Controlled Substance Database was reviewed by me.  This patient was seen by Jonetta Osgood, FNP-C in collaboration with Dr. Clayborn Bigness as a part of collaborative care agreement.   Deltha Bernales R. Valetta Fuller, MSN, FNP-C Internal medicine

## 2021-03-27 ENCOUNTER — Ambulatory Visit: Payer: Medicare Other | Admitting: Physical Therapy

## 2021-04-01 ENCOUNTER — Ambulatory Visit
Admission: RE | Admit: 2021-04-01 | Discharge: 2021-04-01 | Disposition: A | Discharge status: Home / Self Care | Payer: Medicare Other | Source: Ambulatory Visit | Attending: Nurse Practitioner | Admitting: Nurse Practitioner

## 2021-04-01 ENCOUNTER — Other Ambulatory Visit: Payer: Self-pay

## 2021-04-01 ENCOUNTER — Telehealth: Payer: Self-pay

## 2021-04-01 DIAGNOSIS — Z780 Asymptomatic menopausal state: Secondary | ICD-10-CM | POA: Diagnosis not present

## 2021-04-01 MED ORDER — CIPROFLOXACIN HCL 500 MG PO TABS: 500.0000 mg | ORAL_TABLET | Freq: Two times a day (BID) | ORAL | 0 refills | Status: AC

## 2021-04-01 MED ORDER — METRONIDAZOLE 500 MG PO TABS: 500.0000 mg | ORAL_TABLET | Freq: Three times a day (TID) | ORAL | 0 refills | Status: AC

## 2021-04-01 NOTE — Telephone Encounter (Signed)
Patient was contacted to let her know that she is to start taking antibiotics and to go to the ER if she got worse. Patient was also told to come and see Dr. Vicente Males in 4 weeks for a follow up appointment and she agreed.

## 2021-04-01 NOTE — Telephone Encounter (Signed)
Patient called stating that she has another diverticulitis flare up and is requesting for an antibiotic prescription to be sent to her pharmacy. Patient understands that she might be told that she needs a colonoscopy but she recently had knee surgery and not allowed to drive. Can you please advise.

## 2021-04-02 ENCOUNTER — Ambulatory Visit: Payer: Medicare Other

## 2021-04-02 DIAGNOSIS — M25552 Pain in left hip: Secondary | ICD-10-CM

## 2021-04-02 DIAGNOSIS — R262 Difficulty in walking, not elsewhere classified: Secondary | ICD-10-CM | POA: Diagnosis not present

## 2021-04-02 DIAGNOSIS — M6281 Muscle weakness (generalized): Secondary | ICD-10-CM | POA: Diagnosis not present

## 2021-04-02 DIAGNOSIS — M25661 Stiffness of right knee, not elsewhere classified: Secondary | ICD-10-CM | POA: Diagnosis not present

## 2021-04-02 DIAGNOSIS — M25561 Pain in right knee: Secondary | ICD-10-CM

## 2021-04-02 DIAGNOSIS — G8929 Other chronic pain: Secondary | ICD-10-CM

## 2021-04-02 NOTE — Progress Notes (Signed)
I have reviewed the results. Bone density is normal at this time. Will discuss in detail at her next office visit.

## 2021-04-02 NOTE — Therapy (Addendum)
[facility] [address], [place], [address] Phone: [phone]   Fax:  [phone]  Physical Therapy Treatment  Patient Details  Name: Whitney [name] MRN: [id number] Date of Birth: January 31, 1951 Referring Provider (PT): Kurtis Bushman, MD.   Encounter Date: 04/02/2021   PT End of Session - 04/02/21 1546     Visit Number 3    Number of Visits 24    Date for PT Re-Evaluation 06/11/21    Authorization Type [employer] MEDICARE (Referring periord from 03/19/2021)    Progress Note Due on Visit 10    PT Start Time 1311    PT Stop Time 1344    PT Time Calculation (min) 33 min    Activity Tolerance Patient tolerated treatment well;Patient limited by pain    Behavior During Therapy [facility] for tasks assessed/performed             Past Medical History:  Diagnosis Date   Abnormal Pap smear of cervix    Pt states she had colposcopy    Anxiety    Aortic atherosclerosis (HCC)    Arthritis    CAD (coronary artery disease)    Chronic kidney disease    s/p LEFT nephrectomy   Chronic lower back pain    COVID    Depression    Diverticulitis    Dyspnea    History of methicillin resistant staphylococcus aureus (MRSA) 12/21/2020   nasal swab pcr   Hx of unilateral nephrectomy 1979   Left Nephrectomy   Hypertension    Lower extremity edema    PAF (paroxysmal atrial fibrillation) (HCC)    Palpitations    PMB (postmenopausal bleeding)    Restless leg syndrome    Sleep apnea    has no cpap   Symptomatic bradycardia    Vitamin D deficiency     Past Surgical History:  Procedure Laterality Date   BIOPSY  10/31/2019   Procedure: BIOPSY;  Surgeon: Irving Copas., MD;  Location: [place];  Service: Gastroenterology;;   CARDIAC CATHETERIZATION     CHOLECYSTECTOMY     COLONOSCOPY WITH PROPOFOL N/A 06/13/2019   Procedure: COLONOSCOPY WITH PROPOFOL;  Surgeon: [name], MD;  Location: [facility] ENDOSCOPY;  Service:  Gastroenterology;  Laterality: N/A;   COLONOSCOPY WITH PROPOFOL N/A 08/12/2019   Procedure: COLONOSCOPY WITH PROPOFOL;  Surgeon: [name], MD;  Location: [facility] ENDOSCOPY;  Service: Gastroenterology;  Laterality: N/A;   COLONOSCOPY WITH PROPOFOL N/A 09/19/2019   Procedure: COLONOSCOPY WITH PROPOFOL;  Surgeon: [name], MD;  Location: [facility] ENDOSCOPY;  Service: Gastroenterology;  Laterality: N/A;   DILATION AND CURETTAGE OF UTERUS     ESOPHAGOGASTRODUODENOSCOPY (EGD) WITH PROPOFOL N/A 06/13/2019   Procedure: ESOPHAGOGASTRODUODENOSCOPY (EGD) WITH PROPOFOL;  Surgeon: [name], MD;  Location: [facility] ENDOSCOPY;  Service: Gastroenterology;  Laterality: N/A;  Pt will go for COVID test on [id number] as she uses public transportation and will be in the area on that day.    ESOPHAGOGASTRODUODENOSCOPY (EGD) WITH PROPOFOL N/A 10/31/2019   Procedure: ESOPHAGOGASTRODUODENOSCOPY (EGD) WITH PROPOFOL;  Surgeon: Rush Landmark Telford Nab., MD;  Location: [place];  Service: Gastroenterology;  Laterality: N/A;   HEMOSTASIS CLIP PLACEMENT  10/31/2019   Procedure: HEMOSTASIS CLIP PLACEMENT;  Surgeon: Irving Copas., MD;  Location: [place];  Service: Gastroenterology;;   HERNIA REPAIR     HYSTEROSCOPY WITH D & C N/A 02/16/2017   Procedure: DILATATION AND CURETTAGE /HYSTEROSCOPY;  Surgeon: Brayton Mars, MD;  Location: [facility]S;  Service: Gynecology;  Laterality: N/A;   JOINT REPLACEMENT Left    knee   kidney removal Left    KIDNEY SURGERY Left 1979   left kidney removed   LEFT HEART CATH AND CORONARY ANGIOGRAPHY N/A 10/21/2017   Procedure: LEFT HEART CATH AND CORONARY ANGIOGRAPHY;  Surgeon: Teodoro Spray, MD;  Location: [place] CV LAB;  Service: Cardiovascular;  Laterality: N/A;   POLYPECTOMY  10/31/2019   Procedure: POLYPECTOMY;  Surgeon: Rush Landmark Telford Nab., MD;  Location: [place];  Service: Gastroenterology;;   TOTAL KNEE ARTHROPLASTY Left 07/06/2017   Procedure: TOTAL KNEE  ARTHROPLASTY;  Surgeon: Lovell Sheehan, MD;  Location: [facility];  Service: Orthopedics;  Laterality: Left;   TOTAL KNEE ARTHROPLASTY Right 02/25/2021   Procedure: TOTAL KNEE ARTHROPLASTY;  Surgeon: Lovell Sheehan, MD;  Location: [facility];  Service: Orthopedics;  Laterality: Right;   UPPER ESOPHAGEAL ENDOSCOPIC ULTRASOUND (EUS) N/A 10/31/2019   Procedure: UPPER ESOPHAGEAL ENDOSCOPIC ULTRASOUND (EUS);  Surgeon: Irving Copas., MD;  Location: [place];  Service: Gastroenterology;  Laterality: N/A;   WRIST ARTHROSCOPY Left 1970s   XI ROBOTIC ASSISTED VENTRAL HERNIA N/A 12/01/2019   Procedure: XI ROBOTIC ASSISTED VENTRAL HERNIA;  Surgeon: Jules Husbands, MD;  Location: [facility];  Service: General;  Laterality: N/A;    There were no vitals filed for this visit.   Subjective Assessment - 04/02/21 1517     Subjective Patient states that she is doing well. Patient reports 5/10 pain at beginning of session from arch of R foot to R hip. She reports having to go to the emergency room the Friday after her appointment due to R leg pain from the arch of her foot to her hip. At the emergency room, the patient states she was told that she "over did it" during PT and that she needed to rest, stay off of her feet, and ice. Patient states that was very sedentary since leaving the emergency room and that she has not moved her leg or walked very much at all since then.  Patient arrived wearing an ankle brace that she was given while at the emergency room to manage pain. Patient states that her R leg hurts as soon as she takes of the ankle brace.    Pertinent History Patient is a 70 year old female who presents to outpatient physical therapy with a referral s/p R TKA (02/25/2021). This patient's chief complaints consist of R knee and leg pain and limited ROM, leading to the following functional deficits: difficulty with walking, standing, standing from sitting, going up/down stairs, bed mobility, getting in the  shower/tub, getting out of performing ADLs such as sweeping, cooking, cleaning, doing dishes. Relevant past medical history and comorbidities include: asthma, sleep apnea, HTN, CAD, Afib, Anxiety, depression, GERD, renal insufficiency, arthritis, and CKD, L TKA, obesity. Patient denies, history of cancer, stroke, seizure, major lung or heart problems, unexplained weight loss, numbness in groin, bowel or bladder changes.    Limitations Standing;Walking;House hold activities;Other (comment);Lifting;Sitting   Difficulty with walking, standing, standing from sitting, going up/down stairs, bed mobility, getting in the shower/tub, getting out of performing ADLs such as sweeping, cooking, cleaning, doing dishes   How long can you walk comfortably? 2 minutes    Patient Stated Goals "walk without a walker and without a cane"    Currently in Pain? Yes    Pain Score 5     Pain Location Leg    Pain Orientation Right    Pain Type  Surgical pain    Pain Onset Other (comment)   Surgical pain onset 02/25/2021. Radiating R leg pain occured prior to surgery   Pain Frequency Intermittent            TREATMENT  Therapeutic exercise: to centralize symptoms and improve ROM, strength, muscular endurance, and activity tolerance required for successful completion of functional activities.   Seated R knee flexion and extension roll outs with ball under foot, 3x10 Seated R knee LAQ 3x10 Standing R hamstring curl with bilateral UE support. 1x10.  Verbal cues to encourage patient to keep upright trunk while performing motion.  Standing R hip flexion with bilateral UE support. 1x7. Discontinued due 6/10 sharp medial R knee pain   Seated LAQ 2x10  Seated marches 2x10 each leg.    Pt required multimodal cuing for proper technique and to facilitate improved neuromuscular control, strength, range of motion, and functional ability resulting in improved performance and form.   Therapeutic Activities:  Extensive education  provided during therex on how to appropriately self manage R knee pain and swelling after physical therapy/activity with icing. Patient advised to not ice longer than 20 minutes at a time with keeping skin covered entirety of ice applied. Patient educated to avoid using heat to manage knee pain due to risk of increasing inflammation leading to greater R knee pain. Patient also educated on the importance of movement to patient tolerance to maximize R knee ROM/outcomes post surgery. Patient advised not to perform excessive exercise or community mobility since recent exacerbation of R knee pain led to ED visit.     PT Education - 04/02/21 1545     Education Details exercise, purpose form, appropriate self management of symptoms, appropriate movement/mobility recommendations.    Person(s) Educated Patient    Methods Explanation;Demonstration;Tactile cues;Verbal cues    Comprehension Verbalized understanding;Returned demonstration;Tactile cues required;Verbal cues required              PT Short Term Goals - 03/19/21 1058       PT SHORT TERM GOAL #1   Title Patient will be independent with inital home exercise program to improve strength/mobility and functional independence with ADLs.    Baseline Initial HEP provided at eval - more formal HEP to be provided at second visit.    Time 3    Period Weeks    Status New    Target Date 04/09/21               PT Long Term Goals - 03/19/21 1101       PT LONG TERM GOAL #1   Title Patient will be independent with long term home exercise program to improve strength/mobility and functional independence with ADLs.    Baseline Initial HEP provided at eval - more formal HEP to be provided at second visit.   TARGET DATE FOR ALL LONG TERM GOALS: 06/11/2021   Time 12    Period Weeks    Status New   TARGET DATE FOR ALL LONG TERM GOALS: 06/11/2021   Target Date 06/11/21      PT LONG TERM GOAL #2   Title Demonstrate improved FOTO score by 10 units or  greater to demonstrate improvement in overall condition and self-reported functional ability.    Baseline 58 (03/19/2021)    Time 12    Period Weeks    Status New      PT LONG TERM GOAL #3   Title Pt will increase R knee extension/flexion strength to at least a  5/5 MMT grade without pain in order to demonstrate improvement in strength and function    Baseline 3+ strength limited by pain.      PT LONG TERM GOAL #4   Title Patient will ambulate equal or greater than 1000 feet on 6 Minute Walk Test with no AD to demonstrate improved community and household mobility.    Baseline Patient unable to walk greater than 2 minutes (03/19/2021)    Time 12    Period Weeks      PT LONG TERM GOAL #5   Title Patient will improve R knee flexion to 120 degrees or greater to demonstrate improvements in mobility and community ambulation without compensation.    Baseline 93 degrees (03/19/2021)    Time 12    Period Weeks    Status New      Additional Long Term Goals   Additional Long Term Goals Yes      PT LONG TERM GOAL #6   Title Patient will improve R knee extension to 0 degrees or greater to demonstrate improvements in mobility and community ambulation without compensation.    Baseline -11 degrees (03/19/2021)    Time 12    Period Weeks    Status New      PT LONG TERM GOAL #7   Title Patient will reduce 5 Time Sit To Stand to <15 seconds to reduce fall risk and demonstrate improved transfer/gait ability.    Baseline To be tested at next session    Time [place] - 04/02/21 1544     Clinical Impression Statement Session time was limited due to patient late arrival to PT. Patient tolerated treatment well, but Patient pain was provoked to 6/10 with standing hip flexion. however patient responded well to seated exercises per patient report of 0/10 pain in R leg/knee by end of session. Patient required frequent cuing to slow down and control R  leg motion while performing exercises. Extensive patient education provided on appropriate movement and mobility recommendations and self-management of R leg symptoms. Patient will continue to benefit from skilled physical therapy intervention to address pain, mobility and strength deficits.    Personal Factors and Comorbidities Age;Comorbidity 3+;Past/Current Experience    Comorbidities asthma, sleep apnea, HTN, CAD, Afib, Anxiety, depression, GERD, renal insufficiency, arthritis, and CKD, L TKA, obesity    Examination-Activity Limitations Bathing;Sit;Stand;Bed Mobility;Locomotion Level;Bend;Dressing;Squat;Transfers;Hygiene/Grooming;Stairs    Examination-Participation Restrictions Cleaning;Yard Work;Community Activity;Laundry    Stability/Clinical Decision Making Stable/Uncomplicated    Clinical Decision Making Low    Rehab Potential Good    PT Frequency 2x / week    PT Duration 12 weeks    PT Treatment/Interventions ADLs/Self Care Home Management;Cryotherapy;Electrical Stimulation;Moist Heat;Gait training;Functional mobility training;Stair training;Balance training;Therapeutic exercise;Therapeutic activities;Neuromuscular re-education;Patient/family education;Manual techniques;Scar mobilization;Passive range of motion;Joint Manipulations;Dry needling    PT Next Visit Plan Progress LE strength and mobility as tolerated. See pt carryover with correct R knee positioning    PT Home Exercise Plan More formal HEP to be provided at second session    Consulted and Agree with Plan of Care Patient             Patient will benefit from skilled therapeutic intervention in order to improve the following deficits and impairments:  Abnormal gait, Decreased activity tolerance, Decreased endurance, Decreased range of motion, Decreased skin integrity, Decreased strength, Hypomobility, Impaired perceived functional  ability, Impaired sensation, Improper body mechanics, Pain, Decreased balance, Decreased mobility,  Difficulty walking, Increased edema, Impaired flexibility, Obesity  Visit Diagnosis: Acute pain of right knee  Muscle weakness (generalized)  Stiffness of right knee, not elsewhere classified  Difficulty in walking, not elsewhere classified  Chronic pain of right knee  Pain in left hip     Problem List Patient Active Problem List   Diagnosis Date Noted   History of total knee arthroplasty, right 02/25/2021   Intestinal metaplasia of gastric cardia 11/07/2020   Mild intermittent asthma without complication 123456   Shortness of breath 08/08/2020   Vaginal candidiasis 08/08/2020   Atherosclerosis of aorta (HCC) 07/09/2020   Acute diverticulitis 05/13/2020   Fever blister 10/15/2019   Cerumen in auditory canal on examination 10/15/2019   Bilateral hearing loss 10/15/2019   Need for vaccination against Streptococcus pneumoniae using pneumococcal conjugate vaccine 7 10/15/2019   Encounter for screening mammogram for malignant neoplasm of breast 10/15/2019   Colon cancer screening 10/15/2019   Weakness 09/02/2019   Headache disorder 09/02/2019   Stage 3b chronic kidney disease ([place]) 08/10/2019   Elevated erythrocyte sedimentation rate 11/02/2018   Lumbar spondylosis 11/02/2018   PVC's (premature ventricular contractions) 10/18/2018   Primary osteoarthritis of right knee 09/15/2018   Encounter for pre-operative examination 09/15/2018   Obstructive sleep apnea 09/15/2018   Calculus of gallbladder without cholecystitis without obstruction 09/15/2018   Calculus of gallbladder with cholecystitis without biliary obstruction 01/27/2018   Restless leg syndrome 01/27/2018   Generalized abdominal pain 01/11/2018   Cyst of pancreas 01/11/2018   Other specified disorders of kidney and ureter 01/11/2018   Primary insomnia 01/11/2018   Paroxysmal atrial fibrillation ([place]) 11/12/2017   Essential hypertension 10/25/2017   Mixed hyperlipidemia 10/25/2017   Low back pain at multiple  sites 10/25/2017   Dysuria 10/25/2017   Encounter for general adult medical examination with abnormal findings 10/25/2017   Vitamin D deficiency 10/25/2017   Near syncope    Symptomatic bradycardia 10/19/2017   Carpal tunnel syndrome 10/12/2017   Primary osteoarthritis of left knee 07/09/2017   History of total knee arthroplasty 07/06/2017   Chest pain 06/29/2017   Postop check 02/25/2017   Impingement syndrome of shoulder region 01/20/2017   Neck pain 01/20/2017   Obesity (BMI 35.0-39.9 without comorbidity) 07/15/2016   Endometrial polyp 07/15/2016   Morbid obesity ([place]) 07/15/2016   Family history of breast cancer in first degree relative 07/15/2016   Family history of ovarian cancer 07/15/2016   Knee pain 07/04/2016   Bilateral leg pain 06/24/2016   Sherryll Burger, SPT  [place] Caster. Fairly IV, PT, DPT Physical Therapist- [facility]  04/02/2021, 3:58 PM   [facility] [address], [place], [address] Phone: [phone]   Fax:  [phone]  Name: Whitney [name] MRN: [id number] Date of Birth: Dec 21, 1950

## 2021-04-03 DIAGNOSIS — I10 Essential (primary) hypertension: Secondary | ICD-10-CM | POA: Diagnosis not present

## 2021-04-03 DIAGNOSIS — E782 Mixed hyperlipidemia: Secondary | ICD-10-CM | POA: Diagnosis not present

## 2021-04-03 DIAGNOSIS — M1712 Unilateral primary osteoarthritis, left knee: Secondary | ICD-10-CM | POA: Diagnosis not present

## 2021-04-04 ENCOUNTER — Encounter: Payer: Medicare Other | Admitting: Physical Therapy

## 2021-04-09 ENCOUNTER — Telehealth: Payer: Self-pay | Admitting: Pharmacist

## 2021-04-09 DIAGNOSIS — Z96651 Presence of right artificial knee joint: Secondary | ICD-10-CM | POA: Diagnosis not present

## 2021-04-09 NOTE — Progress Notes (Addendum)
    Chronic Care Management Pharmacy Assistant   Name: Whitney [name]  MRN: [id number] DOB: March 09, 1951  Reason for Encounter: General Disease State Call    Conditions to be addressed/monitored: HTN, Afib, Osteoarthritis, HLD, Insomnia, RLS  Recent office visits:  03/26/21 Jonetta Osgood, NP. For follow-up. Per note: Tetanus-Dip and Zoster Vac injected given.   Recent consult visits:  04/01/21 Gastro (Telephone) STARTED Ciprofloxacin 500 mg 2 times daily and Metronidazole 500 mg 3 times daily.   Hospital visits:  03/22/21 [facility] (4 Hours) Lucrezia Starch, MD. For knee pain. No medication.   Medications: Outpatient Encounter Medications as of 04/09/2021  Medication Sig   albuterol (VENTOLIN HFA) 108 (90 Base) MCG/ACT inhaler Inhale 2 puffs into the lungs every 4 (four) hours as needed for wheezing or shortness of breath.   amLODipine (NORVASC) 5 MG tablet Take 1 tablet (5 mg total) by mouth 2 (two) times daily.   aspirin 81 MG chewable tablet Chew 1 tablet (81 mg total) by mouth 2 (two) times daily.   carvedilol (COREG) 6.25 MG tablet Take 1 tablet (6.25 mg total) by mouth 2 (two) times daily with a meal.   Cholecalciferol (VITAMIN D) 50 MCG (2000 UT) tablet Take 2,000 Units by mouth in the morning and at bedtime.    ciprofloxacin (CIPRO) 500 MG tablet Take 1 tablet (500 mg total) by mouth 2 (two) times daily for 14 days.   diclofenac Sodium (VOLTAREN) 1 % GEL Apply 2 g topically daily as needed (Knee pain).   docusate sodium (COLACE) 100 MG capsule Take 1 capsule (100 mg total) by mouth 2 (two) times daily.   ezetimibe (ZETIA) 10 MG tablet Take 1 tablet (10 mg total) by mouth daily. (Patient taking differently: Take 10 mg by mouth every morning.)   flecainide (TAMBOCOR) 50 MG tablet Take 50 mg by mouth as needed.   gabapentin (NEURONTIN) 100 MG capsule Take 200 mg by mouth 2 (two) times daily.   hydrALAZINE (APRESOLINE) 25 MG tablet Take  1 tablet (25 mg total) by mouth 3 (three) times daily. For HTN   HYDROcodone-acetaminophen (NORCO) 7.5-325 MG tablet Take 1 tablet by mouth every 4 (four) hours as needed for moderate pain (pain score 7-10).   methocarbamol (ROBAXIN) 500 MG tablet Take 1 tablet (500 mg total) by mouth 4 (four) times daily.   metroNIDAZOLE (FLAGYL) 500 MG tablet Take 1 tablet (500 mg total) by mouth 3 (three) times daily for 14 days.   orlistat (ALLI) 60 MG capsule Take 60 mg by mouth 3 (three) times daily with meals.   rOPINIRole (REQUIP) 0.5 MG tablet Take 0.5-1 mg by mouth 2 (two) times daily as needed (pain).   No facility-administered encounter medications on file as of 04/09/2021.   GEN CALL: Spoke with the patient in regards of onboarding with [facility]. She stated she was not ready to transition to another pharmacy. She stated she recently stopped all refills on her medications so he can prepare for the transition. She stated she recently received a 90 DS and will let me know she starts to run low.   Care Gaps: N/A.  Star Rating Drugs: N/A.  Follow-Up:Pharmacist Review   Charlann Lange, RMA Clinical Pharmacist Assistant [phone]  5 minutes spent in review, coordination, and documentation.  Reviewed by: Beverly Milch, PharmD Clinical Pharmacist [phone]

## 2021-04-10 ENCOUNTER — Encounter: Payer: Self-pay | Admitting: Physical Therapy

## 2021-04-10 ENCOUNTER — Ambulatory Visit: Payer: Medicare Other | Attending: Orthopedic Surgery | Admitting: Physical Therapy

## 2021-04-10 DIAGNOSIS — M6281 Muscle weakness (generalized): Secondary | ICD-10-CM | POA: Diagnosis not present

## 2021-04-10 DIAGNOSIS — M25661 Stiffness of right knee, not elsewhere classified: Secondary | ICD-10-CM | POA: Diagnosis not present

## 2021-04-10 DIAGNOSIS — M25561 Pain in right knee: Secondary | ICD-10-CM | POA: Diagnosis not present

## 2021-04-10 DIAGNOSIS — M25552 Pain in left hip: Secondary | ICD-10-CM | POA: Diagnosis not present

## 2021-04-10 DIAGNOSIS — G8929 Other chronic pain: Secondary | ICD-10-CM | POA: Insufficient documentation

## 2021-04-10 DIAGNOSIS — R262 Difficulty in walking, not elsewhere classified: Secondary | ICD-10-CM | POA: Insufficient documentation

## 2021-04-10 NOTE — Therapy (Signed)
[facility] [address], [place], [address] Phone: [phone]   Fax:  [phone]  Physical Therapy Treatment  Patient Details  Name: Whitney [name] MRN: [id number] Date of Birth: 01-Dec-1950 Referring Provider (PT): Kurtis Bushman, MD.   Encounter Date: 04/10/2021   PT End of Session - 04/10/21 1441     Visit Number 4    Number of Visits 24    Date for PT Re-Evaluation 06/11/21    Authorization Type [employer] MEDICARE (Referring periord from 03/19/2021)    Progress Note Due on Visit 10    PT Start Time 0146    PT Stop Time 0229    PT Time Calculation (min) 43 min    Activity Tolerance Patient tolerated treatment well;Patient limited by pain    Behavior During Therapy [facility] for tasks assessed/performed             Past Medical History:  Diagnosis Date   Abnormal Pap smear of cervix    Pt states she had colposcopy    Anxiety    Aortic atherosclerosis ([place])    Arthritis    CAD (coronary artery disease)    Chronic kidney disease    s/p LEFT nephrectomy   Chronic lower back pain    COVID    Depression    Diverticulitis    Dyspnea    History of methicillin resistant staphylococcus aureus (MRSA) 12/21/2020   nasal swab pcr   Hx of unilateral nephrectomy 1979   Left Nephrectomy   Hypertension    Lower extremity edema    PAF (paroxysmal atrial fibrillation) (HCC)    Palpitations    PMB (postmenopausal bleeding)    Restless leg syndrome    Sleep apnea    has no cpap   Symptomatic bradycardia    Vitamin D deficiency     Past Surgical History:  Procedure Laterality Date   BIOPSY  10/31/2019   Procedure: BIOPSY;  Surgeon: Irving Copas., MD;  Location: [facility];  Service: Gastroenterology;;   CARDIAC CATHETERIZATION     CHOLECYSTECTOMY     COLONOSCOPY WITH PROPOFOL N/A 06/13/2019   Procedure: COLONOSCOPY WITH PROPOFOL;  Surgeon: [name], MD;  Location: [facility] ENDOSCOPY;  Service:  Gastroenterology;  Laterality: N/A;   COLONOSCOPY WITH PROPOFOL N/A 08/12/2019   Procedure: COLONOSCOPY WITH PROPOFOL;  Surgeon: [name], MD;  Location: [facility] ENDOSCOPY;  Service: Gastroenterology;  Laterality: N/A;   COLONOSCOPY WITH PROPOFOL N/A 09/19/2019   Procedure: COLONOSCOPY WITH PROPOFOL;  Surgeon: [name], MD;  Location: [facility] ENDOSCOPY;  Service: Gastroenterology;  Laterality: N/A;   DILATION AND CURETTAGE OF UTERUS     ESOPHAGOGASTRODUODENOSCOPY (EGD) WITH PROPOFOL N/A 06/13/2019   Procedure: ESOPHAGOGASTRODUODENOSCOPY (EGD) WITH PROPOFOL;  Surgeon: [name], MD;  Location: [facility] ENDOSCOPY;  Service: Gastroenterology;  Laterality: N/A;  Pt will go for COVID test on [id number] as she uses public transportation and will be in the area on that day.    ESOPHAGOGASTRODUODENOSCOPY (EGD) WITH PROPOFOL N/A 10/31/2019   Procedure: ESOPHAGOGASTRODUODENOSCOPY (EGD) WITH PROPOFOL;  Surgeon: Rush Landmark Telford Nab., MD;  Location: [facility];  Service: Gastroenterology;  Laterality: N/A;   HEMOSTASIS CLIP PLACEMENT  10/31/2019   Procedure: HEMOSTASIS CLIP PLACEMENT;  Surgeon: Irving Copas., MD;  Location: [facility];  Service: Gastroenterology;;   HERNIA REPAIR     HYSTEROSCOPY WITH D & C N/A 02/16/2017   Procedure: DILATATION AND CURETTAGE /HYSTEROSCOPY;  Surgeon: Brayton Mars, MD;  Location: [facility];  Service: Gynecology;  Laterality: N/A;   JOINT REPLACEMENT Left    knee   kidney removal Left    KIDNEY SURGERY Left 1979   left kidney removed   LEFT HEART CATH AND CORONARY ANGIOGRAPHY N/A 10/21/2017   Procedure: LEFT HEART CATH AND CORONARY ANGIOGRAPHY;  Surgeon: Teodoro Spray, MD;  Location: [facility];  Service: Cardiovascular;  Laterality: N/A;   POLYPECTOMY  10/31/2019   Procedure: POLYPECTOMY;  Surgeon: Rush Landmark Telford Nab., MD;  Location: [facility];  Service: Gastroenterology;;   TOTAL KNEE ARTHROPLASTY Left 07/06/2017   Procedure: TOTAL KNEE  ARTHROPLASTY;  Surgeon: Lovell Sheehan, MD;  Location: [facility];  Service: Orthopedics;  Laterality: Left;   TOTAL KNEE ARTHROPLASTY Right 02/25/2021   Procedure: TOTAL KNEE ARTHROPLASTY;  Surgeon: Lovell Sheehan, MD;  Location: [facility];  Service: Orthopedics;  Laterality: Right;   UPPER ESOPHAGEAL ENDOSCOPIC ULTRASOUND (EUS) N/A 10/31/2019   Procedure: UPPER ESOPHAGEAL ENDOSCOPIC ULTRASOUND (EUS);  Surgeon: Irving Copas., MD;  Location: [facility];  Service: Gastroenterology;  Laterality: N/A;   WRIST ARTHROSCOPY Left 1970s   XI ROBOTIC ASSISTED VENTRAL HERNIA N/A 12/01/2019   Procedure: XI ROBOTIC ASSISTED VENTRAL HERNIA;  Surgeon: Jules Husbands, MD;  Location: [facility];  Service: General;  Laterality: N/A;    There were no vitals filed for this visit.   Subjective Assessment - 04/10/21 1439     Subjective Patient is doing "so so" today. She reports 5/10 medial R knee pain. Patient received a R foot x-ray yesterday at her doctor's appointment. Patient states that the x-ray showed bruised foot/ankle bones. Patient recalls hitting R foot/ankle with cane while at rehab hospital and believes that is when her R foot/ankle pain started. Patient reports HEP compliance.    Pertinent History Patient is a 70 year old female who presents to outpatient physical therapy with a referral s/p R TKA (02/25/2021). This patient's chief complaints consist of R knee and leg pain and limited ROM, leading to the following functional deficits: difficulty with walking, standing, standing from sitting, going up/down stairs, bed mobility, getting in the shower/tub, getting out of performing ADLs such as sweeping, cooking, cleaning, doing dishes. Relevant past medical history and comorbidities include: asthma, sleep apnea, HTN, CAD, Afib, Anxiety, depression, GERD, renal insufficiency, arthritis, and CKD, L TKA, obesity. Patient denies, history of cancer, stroke, seizure, major lung or heart problems, unexplained  weight loss, numbness in groin, bowel or bladder changes.    Limitations Standing;Walking;House hold activities;Other (comment);Lifting;Sitting   Difficulty with walking, standing, standing from sitting, going up/down stairs, bed mobility, getting in the shower/tub, getting out of performing ADLs such as sweeping, cooking, cleaning, doing dishes   How long can you walk comfortably? 2 minutes    Patient Stated Goals "walk without a walker and without a cane"    Currently in Pain? Yes    Pain Score 5     Pain Location Knee    Pain Orientation Right    Pain Descriptors / Indicators Aching    Pain Type Surgical pain    Pain Onset Other (comment)   Surgical pain onset 02/25/2021. Radiating R leg pain occured prior to surgery   Pain Frequency Intermittent             TREATMENT  Manual Therapy: to increase joint mobility, support increased overall ROM, and provide pain relief to joint musculature.  - STM and scar mobilization to R knee. Patient provided with education on how to perform scar  mobilization at home. Performed with patient seated at plinth table.   Therapeutic exercise: to centralize symptoms and improve ROM, strength, muscular endurance, and activity tolerance required for successful completion of functional activities.   -Seated knee flexion stretches x10 with 15 second hold.  -Seated knee extension stretches x10 with 15 second hold.  -Seated LAQ, 1x15 with 5 second hold, 2x15 with 2lb ankle weight -Seated leg press with cable, 1x15 with 20lb cable, 1x15 with 35lb cable -NuStep. For improved extremity mobility, muscular endurance, and activity tolerance; and to induce the analgesic effect of aerobic exercise, stimulate improved joint nutrition, and prepare body structures and systems for following interventions. 3 minutes at level 1 with seat at position 9 to promote terminal knee extension. 3 minutes at level 1 with seat at position 7 to promote knee flexion. Required min  assistance to set up machine.    Pt required multimodal cuing for proper technique and to facilitate improved neuromuscular control, strength, range of motion, and functional ability resulting in improved performance and form.        PT Education - 04/10/21 1440     Education Details exercise purpose/form    Person(s) Educated Patient    Methods Explanation;Demonstration;Tactile cues;Verbal cues    Comprehension Verbalized understanding;Returned demonstration;Verbal cues required;Tactile cues required              PT Short Term Goals - 04/10/21 1447       PT SHORT TERM GOAL #1   Title Patient will be independent with inital home exercise program to improve strength/mobility and functional independence with ADLs.    Baseline Initial HEP provided at eval - more formal HEP to be provided at second visit.    Time 3    Period Weeks    Status Achieved    Target Date 04/09/21               PT Long Term Goals - 03/19/21 1101       PT LONG TERM GOAL #1   Title Patient will be independent with long term home exercise program to improve strength/mobility and functional independence with ADLs.    Baseline Initial HEP provided at eval - more formal HEP to be provided at second visit.   TARGET DATE FOR ALL LONG TERM GOALS: 06/11/2021   Time 12    Period Weeks    Status New   TARGET DATE FOR ALL LONG TERM GOALS: 06/11/2021   Target Date 06/11/21      PT LONG TERM GOAL #2   Title Demonstrate improved FOTO score by 10 units or greater to demonstrate improvement in overall condition and self-reported functional ability.    Baseline 58 (03/19/2021)    Time 12    Period Weeks    Status New      PT LONG TERM GOAL #3   Title Pt will increase R knee extension/flexion strength to at least a 5/5 MMT grade without pain in order to demonstrate improvement in strength and function    Baseline 3+ strength limited by pain.      PT LONG TERM GOAL #4   Title Patient will ambulate equal or  greater than 1000 feet on 6 Minute Walk Test with no AD to demonstrate improved community and household mobility.    Baseline Patient unable to walk greater than 2 minutes (03/19/2021)    Time 12    Period Weeks      PT LONG TERM GOAL #5   Title Patient will  improve R knee flexion to 120 degrees or greater to demonstrate improvements in mobility and community ambulation without compensation.    Baseline 93 degrees (03/19/2021)    Time 12    Period Weeks    Status New      Additional Long Term Goals   Additional Long Term Goals Yes      PT LONG TERM GOAL #6   Title Patient will improve R knee extension to 0 degrees or greater to demonstrate improvements in mobility and community ambulation without compensation.    Baseline -11 degrees (03/19/2021)    Time 12    Period Weeks    Status New      PT LONG TERM GOAL #7   Title Patient will reduce 5 Time Sit To Stand to <15 seconds to reduce fall risk and demonstrate improved transfer/gait ability.    Baseline To be tested at next session    Time 12    Period Weeks    Status New                   Plan - 04/10/21 1449     Clinical Impression Statement Patient tolerated treatment well with decrease in pain/symptoms by end of session. Patient demonstrated improvements in R knee flexion and extension. However, patient safety and ambulation are limited by decreased bilateral knee strength. Patient responded well to incorporation of weighted quad strengthening exercise. Patient will continue to benefit from skilled physical therapy to address pain, mobility, and strength deficits.    Personal Factors and Comorbidities Age;Comorbidity 3+;Past/Current Experience    Comorbidities asthma, sleep apnea, HTN, CAD, Afib, Anxiety, depression, GERD, renal insufficiency, arthritis, and CKD, L TKA, obesity    Examination-Activity Limitations Bathing;Sit;Stand;Bed Mobility;Locomotion Level;Bend;Dressing;Squat;Transfers;Hygiene/Grooming;Stairs     Examination-Participation Restrictions Cleaning;Yard Work;Community Activity;Laundry    Stability/Clinical Decision Making Stable/Uncomplicated    Rehab Potential Good    PT Frequency 2x / week    PT Duration 12 weeks    PT Treatment/Interventions ADLs/Self Care Home Management;Cryotherapy;Electrical Stimulation;Moist Heat;Gait training;Functional mobility training;Stair training;Balance training;Therapeutic exercise;Therapeutic activities;Neuromuscular re-education;Patient/family education;Manual techniques;Scar mobilization;Passive range of motion;Joint Manipulations;Dry needling    PT Next Visit Plan Progress LE strength and mobility as tolerated. See pt carryover with correct R knee positioning    PT Home Exercise Plan More formal HEP to be provided at second session    Consulted and Agree with Plan of Care Patient             Patient will benefit from skilled therapeutic intervention in order to improve the following deficits and impairments:  Abnormal gait, Decreased activity tolerance, Decreased endurance, Decreased range of motion, Decreased skin integrity, Decreased strength, Hypomobility, Impaired perceived functional ability, Impaired sensation, Improper body mechanics, Pain, Decreased balance, Decreased mobility, Difficulty walking, Increased edema, Impaired flexibility, Obesity  Visit Diagnosis: Acute pain of right knee  Muscle weakness (generalized)  Stiffness of right knee, not elsewhere classified  Difficulty in walking, not elsewhere classified  Chronic pain of right knee  Pain in left hip     Problem List Patient Active Problem List   Diagnosis Date Noted   History of total knee arthroplasty, right 02/25/2021   Intestinal metaplasia of gastric cardia 11/07/2020   Mild intermittent asthma without complication 123456   Shortness of breath 08/08/2020   Vaginal candidiasis 08/08/2020   Atherosclerosis of aorta ([place]) 07/09/2020   Acute diverticulitis  05/13/2020   Fever blister 10/15/2019   Cerumen in auditory canal on examination 10/15/2019   Bilateral hearing loss 10/15/2019  Need for vaccination against Streptococcus pneumoniae using pneumococcal conjugate vaccine 7 10/15/2019   Encounter for screening mammogram for malignant neoplasm of breast 10/15/2019   Colon cancer screening 10/15/2019   Weakness 09/02/2019   Headache disorder 09/02/2019   Stage 3b chronic kidney disease ([place]) 08/10/2019   Elevated erythrocyte sedimentation rate 11/02/2018   Lumbar spondylosis 11/02/2018   PVC's (premature ventricular contractions) 10/18/2018   Primary osteoarthritis of right knee 09/15/2018   Encounter for pre-operative examination 09/15/2018   Obstructive sleep apnea 09/15/2018   Calculus of gallbladder without cholecystitis without obstruction 09/15/2018   Calculus of gallbladder with cholecystitis without biliary obstruction 01/27/2018   Restless leg syndrome 01/27/2018   Generalized abdominal pain 01/11/2018   Cyst of pancreas 01/11/2018   Other specified disorders of kidney and ureter 01/11/2018   Primary insomnia 01/11/2018   Paroxysmal atrial fibrillation ([place]) 11/12/2017   Essential hypertension 10/25/2017   Mixed hyperlipidemia 10/25/2017   Low back pain at multiple sites 10/25/2017   Dysuria 10/25/2017   Encounter for general adult medical examination with abnormal findings 10/25/2017   Vitamin D deficiency 10/25/2017   Near syncope    Symptomatic bradycardia 10/19/2017   Carpal tunnel syndrome 10/12/2017   Primary osteoarthritis of left knee 07/09/2017   History of total knee arthroplasty 07/06/2017   Chest pain 06/29/2017   Postop check 02/25/2017   Impingement syndrome of shoulder region 01/20/2017   Neck pain 01/20/2017   Obesity (BMI 35.0-39.9 without comorbidity) 07/15/2016   Endometrial polyp 07/15/2016   Asymptomatic postmenopausal state 07/15/2016   Morbid obesity ([place]) 07/15/2016   Family history of breast  cancer in first degree relative 07/15/2016   Family history of ovarian cancer 07/15/2016   Knee pain 07/04/2016   Bilateral leg pain 06/24/2016   Sherryll Burger, Student - PT  Everlean Alstrom. Graylon Good, PT, DPT 04/10/21, 5:29 PM   [facility] [address] [place], [address] Phone: [phone]   Fax:  [phone]  Name: Whitney [name] MRN: [id number] Date of Birth: 1950-11-21

## 2021-04-15 ENCOUNTER — Telehealth: Payer: Self-pay | Admitting: Pharmacist

## 2021-04-15 ENCOUNTER — Ambulatory Visit: Payer: Medicare Other | Admitting: Physical Therapy

## 2021-04-15 DIAGNOSIS — M25661 Stiffness of right knee, not elsewhere classified: Secondary | ICD-10-CM

## 2021-04-15 DIAGNOSIS — M25561 Pain in right knee: Secondary | ICD-10-CM

## 2021-04-15 DIAGNOSIS — M6281 Muscle weakness (generalized): Secondary | ICD-10-CM

## 2021-04-15 DIAGNOSIS — G8929 Other chronic pain: Secondary | ICD-10-CM | POA: Diagnosis not present

## 2021-04-15 DIAGNOSIS — R262 Difficulty in walking, not elsewhere classified: Secondary | ICD-10-CM

## 2021-04-15 DIAGNOSIS — M25552 Pain in left hip: Secondary | ICD-10-CM | POA: Diagnosis not present

## 2021-04-15 NOTE — Therapy (Signed)
[facility] [address], [place], [address] Phone: [phone]   Fax:  [phone]  Physical Therapy Treatment  Patient Details  Name: MARIJANA DONNELLY MRN: [id number] Date of Birth: Dec 12, 1950 Referring Provider (PT): Kurtis Bushman, MD.   Encounter Date: 04/15/2021   PT End of Session - 04/15/21 1308     Visit Number 5    Number of Visits 24    Date for PT Re-Evaluation 06/11/21    Authorization Type [employer] MEDICARE (Referring periord from 03/19/2021)    Progress Note Due on Visit 10    PT Start Time 1300    PT Stop Time 1340    PT Time Calculation (min) 40 min    Activity Tolerance Patient limited by pain    Behavior During Therapy [facility] for tasks assessed/performed             Past Medical History:  Diagnosis Date   Abnormal Pap smear of cervix    Pt states she had colposcopy    Anxiety    Aortic atherosclerosis ([place])    Arthritis    CAD (coronary artery disease)    Chronic kidney disease    s/p LEFT nephrectomy   Chronic lower back pain    COVID    Depression    Diverticulitis    Dyspnea    History of methicillin resistant staphylococcus aureus (MRSA) 12/21/2020   nasal swab pcr   Hx of unilateral nephrectomy 1979   Left Nephrectomy   Hypertension    Lower extremity edema    PAF (paroxysmal atrial fibrillation) (HCC)    Palpitations    PMB (postmenopausal bleeding)    Restless leg syndrome    Sleep apnea    has no cpap   Symptomatic bradycardia    Vitamin D deficiency     Past Surgical History:  Procedure Laterality Date   BIOPSY  10/31/2019   Procedure: BIOPSY;  Surgeon: Irving Copas., MD;  Location: [place];  Service: Gastroenterology;;   CARDIAC CATHETERIZATION     CHOLECYSTECTOMY     COLONOSCOPY WITH PROPOFOL N/A 06/13/2019   Procedure: COLONOSCOPY WITH PROPOFOL;  Surgeon: [name], MD;  Location: [facility] ENDOSCOPY;  Service: Gastroenterology;  Laterality: N/A;    COLONOSCOPY WITH PROPOFOL N/A 08/12/2019   Procedure: COLONOSCOPY WITH PROPOFOL;  Surgeon: [name], MD;  Location: [facility] ENDOSCOPY;  Service: Gastroenterology;  Laterality: N/A;   COLONOSCOPY WITH PROPOFOL N/A 09/19/2019   Procedure: COLONOSCOPY WITH PROPOFOL;  Surgeon: [name], MD;  Location: [facility] ENDOSCOPY;  Service: Gastroenterology;  Laterality: N/A;   DILATION AND CURETTAGE OF UTERUS     ESOPHAGOGASTRODUODENOSCOPY (EGD) WITH PROPOFOL N/A 06/13/2019   Procedure: ESOPHAGOGASTRODUODENOSCOPY (EGD) WITH PROPOFOL;  Surgeon: [name], MD;  Location: [facility] ENDOSCOPY;  Service: Gastroenterology;  Laterality: N/A;  Pt will go for COVID test on [id number] as she uses public transportation and will be in the area on that day.    ESOPHAGOGASTRODUODENOSCOPY (EGD) WITH PROPOFOL N/A 10/31/2019   Procedure: ESOPHAGOGASTRODUODENOSCOPY (EGD) WITH PROPOFOL;  Surgeon: Rush Landmark Telford Nab., MD;  Location: [place];  Service: Gastroenterology;  Laterality: N/A;   HEMOSTASIS CLIP PLACEMENT  10/31/2019   Procedure: HEMOSTASIS CLIP PLACEMENT;  Surgeon: Irving Copas., MD;  Location: [place];  Service: Gastroenterology;;   HERNIA REPAIR     HYSTEROSCOPY WITH D & C N/A 02/16/2017   Procedure: DILATATION AND CURETTAGE /HYSTEROSCOPY;  Surgeon: Brayton Mars, MD;  Location: [facility];  Service: Gynecology;  Laterality: N/A;   JOINT REPLACEMENT Left    knee   kidney removal Left    KIDNEY SURGERY Left 1979   left kidney removed   LEFT HEART CATH AND CORONARY ANGIOGRAPHY N/A 10/21/2017   Procedure: LEFT HEART CATH AND CORONARY ANGIOGRAPHY;  Surgeon: Teodoro Spray, MD;  Location: [place] [facility];  Service: Cardiovascular;  Laterality: N/A;   POLYPECTOMY  10/31/2019   Procedure: POLYPECTOMY;  Surgeon: Rush Landmark Telford Nab., MD;  Location: [place];  Service: Gastroenterology;;   TOTAL KNEE ARTHROPLASTY Left 07/06/2017   Procedure: TOTAL KNEE ARTHROPLASTY;  Surgeon: Lovell Sheehan,  MD;  Location: [facility];  Service: Orthopedics;  Laterality: Left;   TOTAL KNEE ARTHROPLASTY Right 02/25/2021   Procedure: TOTAL KNEE ARTHROPLASTY;  Surgeon: Lovell Sheehan, MD;  Location: [facility];  Service: Orthopedics;  Laterality: Right;   UPPER ESOPHAGEAL ENDOSCOPIC ULTRASOUND (EUS) N/A 10/31/2019   Procedure: UPPER ESOPHAGEAL ENDOSCOPIC ULTRASOUND (EUS);  Surgeon: Irving Copas., MD;  Location: [place];  Service: Gastroenterology;  Laterality: N/A;   WRIST ARTHROSCOPY Left 1970s   XI ROBOTIC ASSISTED VENTRAL HERNIA N/A 12/01/2019   Procedure: XI ROBOTIC ASSISTED VENTRAL HERNIA;  Surgeon: Jules Husbands, MD;  Location: [facility];  Service: General;  Laterality: N/A;    There were no vitals filed for this visit.   Subjective Assessment - 04/15/21 1305     Subjective Patient reports she continues to have difficulty with R anterior ankle and instep pain. Currently rates it 6/10. Did have increased pain for about 2 days after last PT session. She does continue to report some pain at her right lateral leg or buttock. She has been using epson salt and ice. She states she has had back pain before with injections but has not had spinal surgery. She sees Dr. Sharlet Salina for her back. Review of documentation from Dr. Sharlet Salina shows she has been treated for low back pain with radiation down B LEs.    Pertinent History Patient is a 70 year old female who presents to outpatient physical therapy with a referral s/p R TKA (02/25/2021). This patient's chief complaints consist of R knee and leg pain and limited ROM, leading to the following functional deficits: difficulty with walking, standing, standing from sitting, going up/down stairs, bed mobility, getting in the shower/tub, getting out of performing ADLs such as sweeping, cooking, cleaning, doing dishes. Relevant past medical history and comorbidities include: asthma, sleep apnea, HTN, CAD, Afib, Anxiety, depression, GERD, renal insufficiency,  arthritis, and CKD, L TKA, obesity. Patient denies, history of cancer, stroke, seizure, major lung or heart problems, unexplained weight loss, numbness in groin, bowel or bladder changes.    Limitations Standing;Walking;House hold activities;Other (comment);Lifting;Sitting   Difficulty with walking, standing, standing from sitting, going up/down stairs, bed mobility, getting in the shower/tub, getting out of performing ADLs such as sweeping, cooking, cleaning, doing dishes   How long can you walk comfortably? 2 minutes    Patient Stated Goals "walk without a walker and without a cane"    Currently in Pain? Yes    Pain Score 6     Pain Location Ankle    Pain Orientation Right    Pain Onset Other (comment)   Surgical pain onset 02/25/2021. Radiating R leg pain occured prior to surgery           OBJECTIVE  SPINE MOTION Lumbar AROM *Indicates pain Flexion: = fingers to toes (normal range), pulling down back of R leg, worse Extension: = 75%,  pain down to right calf and left thigh.  Rotation: R = WFL except legs feel weak, L = WFL except legs feel weak. Side Flexion: R = increased pain in right lateral leg, L = WFL except legs feel weak  PERIPHERAL MOTION PROM R knee flexion: at least 110 with excruciating reproduction of pain over anterior lower leg and ankle up to 10/10. Able to get up and ambulate after and appears to be going back to baseline. (Worst reproduction of pain in entire exam).   REPEATED MOTIONS TESTING: Prone press up 1x10, peripheralizing, worse with pain and paresthesia in R ankle Seated lumbar  flexion 2x10, decreased and reported no pain while sitting by end of first set, but hurts again after sciatic nerve floss 1x10 followed by another set of seated flexion. Unclear.   SPECIAL TESTS: Straight leg raise (SLR): R = positive for concordant ankle pain, L = positive for posterior thigh.   ACCESSORY MOTION:  Tender and hypomobile to CPA along lumbar spine.    PALPATION: TTP over bilateral lumbar paraspinals, slightly in left glutes, and significantly tender/tight in R glutes with report of concordant ankle tingling with pressure to deep external rotators    TREATMENT  Manual Therapy: to increase joint mobility, support increased overall ROM, and provide pain relief to joint musculature.  - STM and scar mobilization to R knee. Patient provided with education on how to perform scar mobilization at home. Performed with patient seated at plinth table.    Therapeutic exercise: to centralize symptoms and improve ROM, strength, muscular endurance, and activity tolerance required for successful completion of functional activities.   - NuStep. For improved extremity mobility, muscular endurance, and activity tolerance; and to induce the analgesic effect of aerobic exercise, stimulate improved joint nutrition, and prepare body structures and systems for following interventions. 5 minutes at level 1 with seat at position 10 to promote terminal knee extension.  - examination to assess possible contribution from lumbar spine to ankle pain that is limiting knee rehab.  - 2x10 seated lumbar flexion  - seated R sciatic nerve glide, slider technique, 1x10 (increases pain from 0/10 baseline).  - Education on HEP including handout  - Education on diagnosis, prognosis, POC, anatomy and physiology of current condition.   Manual therapy: to reduce pain and tissue tension, improve range of motion, neuromodulation, in order to promote improved ability to complete functional activities. SUPINE/HOOKLYING - orthopedic edema massage techniques applied to R thigh, lower leg, and knee region as tolerated.  - R patellar mobs grade III-IV, medial, lateral, cephalic - R scar massage  - R PROM flexion 1x10 as tolerated (excruciating pain caused 10/10 pain at ankle)  Pt required multimodal cuing for proper technique and to facilitate improved neuromuscular control, strength,  range of motion, and functional ability resulting in improved performance and form.    HOME EXERCISE PROGRAM Access Code: [email or web address] URL: [email or web address] Date: 04/15/2021 Prepared by: Rosita Kea  Exercises Seated Slump Nerve Glide - 3 x daily - 1 sets - 10 reps Seated Lumbar Flexion Stretch - 3 x daily - 1-2 sets - 10-20 reps   PT Education - 04/15/21 1308     Education Details exercise purpose/form    Person(s) Educated Patient    Methods Explanation;Demonstration;Tactile cues;Verbal cues    Comprehension Verbalized understanding;Returned demonstration;Verbal cues required;Tactile cues required;Need further instruction              PT Short Term Goals - 04/10/21 1447       PT  SHORT TERM GOAL #1   Title Patient will be independent with inital home exercise program to improve strength/mobility and functional independence with ADLs.    Baseline Initial HEP provided at eval - more formal HEP to be provided at second visit.    Time 3    Period Weeks    Status Achieved    Target Date 04/09/21               PT Long Term Goals - 03/19/21 1101       PT LONG TERM GOAL #1   Title Patient will be independent with long term home exercise program to improve strength/mobility and functional independence with ADLs.    Baseline Initial HEP provided at eval - more formal HEP to be provided at second visit.   TARGET DATE FOR ALL LONG TERM GOALS: 06/11/2021   Time 12    Period Weeks    Status New   TARGET DATE FOR ALL LONG TERM GOALS: 06/11/2021   Target Date 06/11/21      PT LONG TERM GOAL #2   Title Demonstrate improved FOTO score by 10 units or greater to demonstrate improvement in overall condition and self-reported functional ability.    Baseline 58 (03/19/2021)    Time 12    Period Weeks    Status New      PT LONG TERM GOAL #3   Title Pt will increase R knee extension/flexion strength to at least a 5/5 MMT grade without pain in order to demonstrate  improvement in strength and function    Baseline 3+ strength limited by pain.      PT LONG TERM GOAL #4   Title Patient will ambulate equal or greater than 1000 feet on 6 Minute Walk Test with no AD to demonstrate improved community and household mobility.    Baseline Patient unable to walk greater than 2 minutes (03/19/2021)    Time 12    Period Weeks      PT LONG TERM GOAL #5   Title Patient will improve R knee flexion to 120 degrees or greater to demonstrate improvements in mobility and community ambulation without compensation.    Baseline 93 degrees (03/19/2021)    Time 12    Period Weeks    Status New      Additional Long Term Goals   Additional Long Term Goals Yes      PT LONG TERM GOAL #6   Title Patient will improve R knee extension to 0 degrees or greater to demonstrate improvements in mobility and community ambulation without compensation.    Baseline -11 degrees (03/19/2021)    Time 12    Period Weeks    Status New      PT LONG TERM GOAL #7   Title Patient will reduce 5 Time Sit To Stand to <15 seconds to reduce fall risk and demonstrate improved transfer/gait ability.    Baseline To be tested at next session    Time 12    Period Weeks    Status New                   Plan - 04/15/21 1406     Clinical Impression Statement Pateint continues to complain of most limiting pain being in the right ankle with report of this pain up to hip, prompting a more thorough lumbar exam to try to determine if ankle/hip pain may be coming from low back. Patient does have concordant pain/paresthesia with R SLR, palpation  to right glute and posterior hip, and describes peripheralization of concordant symptoms from 6/10 to 8/10 with repeated lumbar extension, suggesting a lumbar contribution to ankle pain. Patient also appeared to get some temporary relief from lumbar flexion. However, patient had significantly stronger concordant response to R knee PROM flexion with overpressure and  continues to report increased concordant pain with R ankle AROM, suggesting pain may be more related to R TKA surgery and/or ankle where pt understood bone bruise was detected with imaging. Patient provided with two exercises targeting possible radicular symptoms with lumbar flexion preference. Orthopedic edema massage applied to attempt to decrease pain. Patient only able to tolerate 110-115 flexion in R knee due to excruciating pain with soft end feel. Plan to continue with tolerated interventions targeting the knee as tolerated next session; however, patient's tolerance continues to be limited by R ankle pain. Patient would benefit from continued management of limiting condition by skilled physical therapist to address remaining impairments and functional limitations to work towards stated goals and return to PLOF or maximal functional independence.    Personal Factors and Comorbidities Age;Comorbidity 3+;Past/Current Experience    Comorbidities asthma, sleep apnea, HTN, CAD, Afib, Anxiety, depression, GERD, renal insufficiency, arthritis, and CKD, L TKA, obesity    Examination-Activity Limitations Bathing;Sit;Stand;Bed Mobility;Locomotion Level;Bend;Dressing;Squat;Transfers;Hygiene/Grooming;Stairs    Examination-Participation Restrictions Cleaning;Yard Work;Community Activity;Laundry    Stability/Clinical Decision Making Stable/Uncomplicated    Rehab Potential Good    PT Frequency 2x / week    PT Duration 12 weeks    PT Treatment/Interventions ADLs/Self Care Home Management;Cryotherapy;Electrical Stimulation;Moist Heat;Gait training;Functional mobility training;Stair training;Balance training;Therapeutic exercise;Therapeutic activities;Neuromuscular re-education;Patient/family education;Manual techniques;Scar mobilization;Passive range of motion;Joint Manipulations;Dry needling    PT Next Visit Plan Progress LE strength and mobility as tolerated. See pt carryover with correct R knee positioning    PT  Home Exercise Plan More formal HEP to be provided at second session    Consulted and Agree with Plan of Care Patient             Patient will benefit from skilled therapeutic intervention in order to improve the following deficits and impairments:  Abnormal gait, Decreased activity tolerance, Decreased endurance, Decreased range of motion, Decreased skin integrity, Decreased strength, Hypomobility, Impaired perceived functional ability, Impaired sensation, Improper body mechanics, Pain, Decreased balance, Decreased mobility, Difficulty walking, Increased edema, Impaired flexibility, Obesity  Visit Diagnosis: Acute pain of right knee  Muscle weakness (generalized)  Stiffness of right knee, not elsewhere classified  Difficulty in walking, not elsewhere classified     Problem List Patient Active Problem List   Diagnosis Date Noted   History of total knee arthroplasty, right 02/25/2021   Intestinal metaplasia of gastric cardia 11/07/2020   Mild intermittent asthma without complication 123456   Shortness of breath 08/08/2020   Vaginal candidiasis 08/08/2020   Atherosclerosis of aorta (HCC) 07/09/2020   Acute diverticulitis 05/13/2020   Fever blister 10/15/2019   Cerumen in auditory canal on examination 10/15/2019   Bilateral hearing loss 10/15/2019   Need for vaccination against Streptococcus pneumoniae using pneumococcal conjugate vaccine 7 10/15/2019   Encounter for screening mammogram for malignant neoplasm of breast 10/15/2019   Colon cancer screening 10/15/2019   Weakness 09/02/2019   Headache disorder 09/02/2019   Stage 3b chronic kidney disease ([place]) 08/10/2019   Elevated erythrocyte sedimentation rate 11/02/2018   Lumbar spondylosis 11/02/2018   PVC's (premature ventricular contractions) 10/18/2018   Primary osteoarthritis of right knee 09/15/2018   Encounter for pre-operative examination 09/15/2018   Obstructive sleep apnea  09/15/2018   Calculus of gallbladder  without cholecystitis without obstruction 09/15/2018   Calculus of gallbladder with cholecystitis without biliary obstruction 01/27/2018   Restless leg syndrome 01/27/2018   Generalized abdominal pain 01/11/2018   Cyst of pancreas 01/11/2018   Other specified disorders of kidney and ureter 01/11/2018   Primary insomnia 01/11/2018   Paroxysmal atrial fibrillation ([place]) 11/12/2017   Essential hypertension 10/25/2017   Mixed hyperlipidemia 10/25/2017   Low back pain at multiple sites 10/25/2017   Dysuria 10/25/2017   Encounter for general adult medical examination with abnormal findings 10/25/2017   Vitamin D deficiency 10/25/2017   Near syncope    Symptomatic bradycardia 10/19/2017   Carpal tunnel syndrome 10/12/2017   Primary osteoarthritis of left knee 07/09/2017   History of total knee arthroplasty 07/06/2017   Chest pain 06/29/2017   Postop check 02/25/2017   Impingement syndrome of shoulder region 01/20/2017   Neck pain 01/20/2017   Obesity (BMI 35.0-39.9 without comorbidity) 07/15/2016   Endometrial polyp 07/15/2016   Asymptomatic postmenopausal state 07/15/2016   Morbid obesity ([place]) 07/15/2016   Family history of breast cancer in first degree relative 07/15/2016   Family history of ovarian cancer 07/15/2016   Knee pain 07/04/2016   Bilateral leg pain 06/24/2016    Everlean Alstrom. Graylon Good, PT, DPT 04/15/21, 2:08 PM   [facility] [address], [place], [address] Phone: [phone]   Fax:  [phone]  Name: ELLIYANNA TORIZ MRN: [id number] Date of Birth: 1951/07/04

## 2021-04-15 NOTE — Progress Notes (Signed)
    Chronic Care Management Pharmacy Assistant   Name: KAYRON SCHAAN  MRN: [id number] DOB: Jul 09, 1951  Reason for Encounter: CCM Care Plan  Medications: Outpatient Encounter Medications as of 04/15/2021  Medication Sig   albuterol (VENTOLIN HFA) 108 (90 Base) MCG/ACT inhaler Inhale 2 puffs into the lungs every 4 (four) hours as needed for wheezing or shortness of breath.   amLODipine (NORVASC) 5 MG tablet Take 1 tablet (5 mg total) by mouth 2 (two) times daily.   aspirin 81 MG chewable tablet Chew 1 tablet (81 mg total) by mouth 2 (two) times daily.   carvedilol (COREG) 6.25 MG tablet Take 1 tablet (6.25 mg total) by mouth 2 (two) times daily with a meal.   Cholecalciferol (VITAMIN D) 50 MCG (2000 UT) tablet Take 2,000 Units by mouth in the morning and at bedtime.    ciprofloxacin (CIPRO) 500 MG tablet Take 1 tablet (500 mg total) by mouth 2 (two) times daily for 14 days.   diclofenac Sodium (VOLTAREN) 1 % GEL Apply 2 g topically daily as needed (Knee pain).   docusate sodium (COLACE) 100 MG capsule Take 1 capsule (100 mg total) by mouth 2 (two) times daily.   ezetimibe (ZETIA) 10 MG tablet Take 1 tablet (10 mg total) by mouth daily. (Patient taking differently: Take 10 mg by mouth every morning.)   flecainide (TAMBOCOR) 50 MG tablet Take 50 mg by mouth as needed.   gabapentin (NEURONTIN) 100 MG capsule Take 200 mg by mouth 2 (two) times daily.   hydrALAZINE (APRESOLINE) 25 MG tablet Take 1 tablet (25 mg total) by mouth 3 (three) times daily. For HTN   HYDROcodone-acetaminophen (NORCO) 7.5-325 MG tablet Take 1 tablet by mouth every 4 (four) hours as needed for moderate pain (pain score 7-10).   methocarbamol (ROBAXIN) 500 MG tablet Take 1 tablet (500 mg total) by mouth 4 (four) times daily.   metroNIDAZOLE (FLAGYL) 500 MG tablet Take 1 tablet (500 mg total) by mouth 3 (three) times daily for 14 days.   orlistat (ALLI) 60 MG capsule Take 60 mg by mouth 3 (three) times daily with meals.    rOPINIRole (REQUIP) 0.5 MG tablet Take 0.5-1 mg by mouth 2 (two) times daily as needed (pain).   No facility-administered encounter medications on file as of 04/15/2021.   Reviewed the patients initial visit reinsured it was completed per the pharmacist Leata Mouse request. Printed the CCM care plan. Mailed the patient CCM care plan to their most recent address on file.  Follow-Up:Pharmacist Review  Charlann Lange, [place] Pharmacist Assistant [phone]

## 2021-04-17 ENCOUNTER — Ambulatory Visit: Payer: Medicare Other | Admitting: Physical Therapy

## 2021-04-22 ENCOUNTER — Ambulatory Visit: Payer: Medicare Other | Admitting: Physical Therapy

## 2021-04-22 ENCOUNTER — Encounter: Payer: Self-pay | Admitting: Physical Therapy

## 2021-04-22 DIAGNOSIS — M25561 Pain in right knee: Secondary | ICD-10-CM

## 2021-04-22 DIAGNOSIS — M6281 Muscle weakness (generalized): Secondary | ICD-10-CM | POA: Diagnosis not present

## 2021-04-22 DIAGNOSIS — R262 Difficulty in walking, not elsewhere classified: Secondary | ICD-10-CM | POA: Diagnosis not present

## 2021-04-22 DIAGNOSIS — G8929 Other chronic pain: Secondary | ICD-10-CM

## 2021-04-22 DIAGNOSIS — M25552 Pain in left hip: Secondary | ICD-10-CM | POA: Diagnosis not present

## 2021-04-22 DIAGNOSIS — M25661 Stiffness of right knee, not elsewhere classified: Secondary | ICD-10-CM

## 2021-04-22 NOTE — Therapy (Signed)
[place] [facility] [address], [place], [address] Phone: [phone]   Fax:  [phone]  Physical Therapy Treatment  Patient Details  Name: Whitney [name] MRN: [id number] Date of Birth: 1951/05/11 Referring Provider (PT): Kurtis Bushman, MD.   Encounter Date: 04/22/2021   PT End of Session - 04/22/21 1418     Visit Number 6    Number of Visits 24    Date for PT Re-Evaluation 06/11/21    Authorization Type [employer] MEDICARE (Referring periord from 03/19/2021)    Progress Note Due on Visit 10    PT Start Time [id number]    PT Stop Time 1428    PT Time Calculation (min) 40 min    Activity Tolerance Patient limited by pain    Behavior During Therapy [facility] for tasks assessed/performed             Past Medical History:  Diagnosis Date   Abnormal Pap smear of cervix    Pt states she had colposcopy    Anxiety    Aortic atherosclerosis (HCC)    Arthritis    CAD (coronary artery disease)    Chronic kidney disease    s/p LEFT nephrectomy   Chronic lower back pain    COVID    Depression    Diverticulitis    Dyspnea    History of methicillin resistant staphylococcus aureus (MRSA) 12/21/2020   nasal swab pcr   Hx of unilateral nephrectomy 1979   Left Nephrectomy   Hypertension    Lower extremity edema    PAF (paroxysmal atrial fibrillation) (HCC)    Palpitations    PMB (postmenopausal bleeding)    Restless leg syndrome    Sleep apnea    has no cpap   Symptomatic bradycardia    Vitamin D deficiency     Past Surgical History:  Procedure Laterality Date   BIOPSY  10/31/2019   Procedure: BIOPSY;  Surgeon: Irving Copas., MD;  Location: [facility] ENDOSCOPY;  Service: Gastroenterology;;   CARDIAC CATHETERIZATION     CHOLECYSTECTOMY     COLONOSCOPY WITH PROPOFOL N/A 06/13/2019   Procedure: COLONOSCOPY WITH PROPOFOL;  Surgeon: [name], MD;  Location: [facility] ENDOSCOPY;  Service: Gastroenterology;  Laterality: N/A;    COLONOSCOPY WITH PROPOFOL N/A 08/12/2019   Procedure: COLONOSCOPY WITH PROPOFOL;  Surgeon: [name], MD;  Location: [facility] ENDOSCOPY;  Service: Gastroenterology;  Laterality: N/A;   COLONOSCOPY WITH PROPOFOL N/A 09/19/2019   Procedure: COLONOSCOPY WITH PROPOFOL;  Surgeon: [name], MD;  Location: [facility] ENDOSCOPY;  Service: Gastroenterology;  Laterality: N/A;   DILATION AND CURETTAGE OF UTERUS     ESOPHAGOGASTRODUODENOSCOPY (EGD) WITH PROPOFOL N/A 06/13/2019   Procedure: ESOPHAGOGASTRODUODENOSCOPY (EGD) WITH PROPOFOL;  Surgeon: [name], MD;  Location: [facility] ENDOSCOPY;  Service: Gastroenterology;  Laterality: N/A;  Pt will go for COVID test on [id number] as she uses public transportation and will be in the area on that day.    ESOPHAGOGASTRODUODENOSCOPY (EGD) WITH PROPOFOL N/A 10/31/2019   Procedure: ESOPHAGOGASTRODUODENOSCOPY (EGD) WITH PROPOFOL;  Surgeon: Rush Landmark Telford Nab., MD;  Location: [place];  Service: Gastroenterology;  Laterality: N/A;   HEMOSTASIS CLIP PLACEMENT  10/31/2019   Procedure: HEMOSTASIS CLIP PLACEMENT;  Surgeon: Irving Copas., MD;  Location: [place];  Service: Gastroenterology;;   HERNIA REPAIR     HYSTEROSCOPY WITH D & C N/A 02/16/2017   Procedure: DILATATION AND CURETTAGE /HYSTEROSCOPY;  Surgeon: Brayton Mars, MD;  Location: [facility] ORS;  Service: Gynecology;  Laterality: N/A;   JOINT REPLACEMENT Left    knee   kidney removal Left    KIDNEY SURGERY Left 1979   left kidney removed   LEFT HEART CATH AND CORONARY ANGIOGRAPHY N/A 10/21/2017   Procedure: LEFT HEART CATH AND CORONARY ANGIOGRAPHY;  Surgeon: Teodoro Spray, MD;  Location: [place] CV LAB;  Service: Cardiovascular;  Laterality: N/A;   POLYPECTOMY  10/31/2019   Procedure: POLYPECTOMY;  Surgeon: Rush Landmark Telford Nab., MD;  Location: [place];  Service: Gastroenterology;;   TOTAL KNEE ARTHROPLASTY Left 07/06/2017   Procedure: TOTAL KNEE ARTHROPLASTY;  Surgeon: Lovell Sheehan,  MD;  Location: [facility] ORS;  Service: Orthopedics;  Laterality: Left;   TOTAL KNEE ARTHROPLASTY Right 02/25/2021   Procedure: TOTAL KNEE ARTHROPLASTY;  Surgeon: Lovell Sheehan, MD;  Location: [facility] ORS;  Service: Orthopedics;  Laterality: Right;   UPPER ESOPHAGEAL ENDOSCOPIC ULTRASOUND (EUS) N/A 10/31/2019   Procedure: UPPER ESOPHAGEAL ENDOSCOPIC ULTRASOUND (EUS);  Surgeon: Irving Copas., MD;  Location: [place];  Service: Gastroenterology;  Laterality: N/A;   WRIST ARTHROSCOPY Left 1970s   XI ROBOTIC ASSISTED VENTRAL HERNIA N/A 12/01/2019   Procedure: XI ROBOTIC ASSISTED VENTRAL HERNIA;  Surgeon: Jules Husbands, MD;  Location: [facility] ORS;  Service: General;  Laterality: N/A;    There were no vitals filed for this visit.   Subjective Assessment - 04/22/21 1356     Subjective Patient is doing well. She states that her R foot and L are at 80% today and feeling much better since last PT session, but patient did not provide pain score. Patient said that she was sore after her last PT session. Patient reports HEP compliance.    Pertinent History Patient is a 70 year old female who presents to outpatient physical therapy with a referral s/p R TKA (02/25/2021). This patient's chief complaints consist of R knee and leg pain and limited ROM, leading to the following functional deficits: difficulty with walking, standing, standing from sitting, going up/down stairs, bed mobility, getting in the shower/tub, getting out of performing ADLs such as sweeping, cooking, cleaning, doing dishes. Relevant past medical history and comorbidities include: asthma, sleep apnea, HTN, CAD, Afib, Anxiety, depression, GERD, renal insufficiency, arthritis, and CKD, L TKA, obesity. Patient denies, history of cancer, stroke, seizure, major lung or heart problems, unexplained weight loss, numbness in groin, bowel or bladder changes.    Limitations Standing;Walking;House hold activities;Other (comment);Lifting;Sitting    Difficulty with walking, standing, standing from sitting, going up/down stairs, bed mobility, getting in the shower/tub, getting out of performing ADLs such as sweeping, cooking, cleaning, doing dishes   How long can you walk comfortably? 2 minutes    Patient Stated Goals "walk without a walker and without a cane"    Currently in Pain? Yes    Pain Onset Other (comment)   Surgical pain onset 02/25/2021. Radiating R leg pain occured prior to surgery            TREATMENT:  Therapeutic exercise: to centralize symptoms and improve ROM, strength, muscular endurance, and activity tolerance required for successful completion of functional activities.   - NuStep. For improved extremity mobility, muscular endurance, and activity tolerance; and to induce the analgesic effect of aerobic exercise, stimulate improved joint nutrition, and prepare body structures and systems for following interventions. 5 minutes at level 1 with seat at position 10 to promote terminal knee extension. Average SPM 94.  - seated single leg press with cable, 2x10 with 35lbs and 1x10 with 55lbs with  R leg, 3x10 with 55lbs with L leg.  -Seated knee flexion stretches x15 with 15 second hold.  -Seated knee extension stretches x5  with 15 second hold.  -Seated LAQ, 2x15 each leg with 5 second hold and 4lb ankle weights   Pt required multimodal cuing for proper technique and to facilitate improved neuromuscular control, strength, range of motion, and functional ability resulting in improved performance and form.     HOME EXERCISE PROGRAM Access Code: [email or web address] URL: [email or web address] Date: 04/15/2021 Prepared by: Rosita Kea   Exercises Seated Slump Nerve Glide - 3 x daily - 1 sets - 10 reps Seated Lumbar Flexion Stretch - 3 x daily - 1-2 sets - 10-20 reps       PT Education - 04/22/21 1417     Education Details exercise purpose/form    Person(s) Educated Patient    Methods  Explanation;Demonstration;Tactile cues;Verbal cues    Comprehension Verbalized understanding;Returned demonstration;Tactile cues required;Verbal cues required              PT Short Term Goals - 04/10/21 1447       PT SHORT TERM GOAL #1   Title Patient will be independent with inital home exercise program to improve strength/mobility and functional independence with ADLs.    Baseline Initial HEP provided at eval - more formal HEP to be provided at second visit.    Time 3    Period Weeks    Status Achieved    Target Date 04/09/21               PT Long Term Goals - 03/19/21 1101       PT LONG TERM GOAL #1   Title Patient will be independent with long term home exercise program to improve strength/mobility and functional independence with ADLs.    Baseline Initial HEP provided at eval - more formal HEP to be provided at second visit.   TARGET DATE FOR ALL LONG TERM GOALS: 06/11/2021   Time 12    Period Weeks    Status New   TARGET DATE FOR ALL LONG TERM GOALS: 06/11/2021   Target Date 06/11/21      PT LONG TERM GOAL #2   Title Demonstrate improved FOTO score by 10 units or greater to demonstrate improvement in overall condition and self-reported functional ability.    Baseline 58 (03/19/2021)    Time 12    Period Weeks    Status New      PT LONG TERM GOAL #3   Title Pt will increase R knee extension/flexion strength to at least a 5/5 MMT grade without pain in order to demonstrate improvement in strength and function    Baseline 3+ strength limited by pain.      PT LONG TERM GOAL #4   Title Patient will ambulate equal or greater than 1000 feet on 6 Minute Walk Test with no AD to demonstrate improved community and household mobility.    Baseline Patient unable to walk greater than 2 minutes (03/19/2021)    Time 12    Period Weeks      PT LONG TERM GOAL #5   Title Patient will improve R knee flexion to 120 degrees or greater to demonstrate improvements in mobility and  community ambulation without compensation.    Baseline 93 degrees (03/19/2021)    Time 12    Period Weeks    Status New      Additional Long Term Goals   Additional Long Term Goals Yes  PT LONG TERM GOAL #6   Title Patient will improve R knee extension to 0 degrees or greater to demonstrate improvements in mobility and community ambulation without compensation.    Baseline -11 degrees (03/19/2021)    Time 12    Period Weeks    Status New      PT LONG TERM GOAL #7   Title Patient will reduce 5 Time Sit To Stand to <15 seconds to reduce fall risk and demonstrate improved transfer/gait ability.    Baseline To be tested at next session    Time 12    Period Weeks    Status New                   Plan - 04/22/21 1435     Clinical Impression Statement Patient tolerated treatment well. Patient responded well to progression of leg press from bilateral to single leg press to prevent compensations and maximize LE strengthening. Patient continues to demonstrate improvemnts in R knee flexion and extension ROM, however, patient R flexion remains most limited. Patient will continue to benefit from skilled physical therapy intervention to address impairments and maximize functional mobility.    Personal Factors and Comorbidities Age;Comorbidity 3+;Past/Current Experience    Comorbidities asthma, sleep apnea, HTN, CAD, Afib, Anxiety, depression, GERD, renal insufficiency, arthritis, and CKD, L TKA, obesity    Examination-Activity Limitations Bathing;Sit;Stand;Bed Mobility;Locomotion Level;Bend;Dressing;Squat;Transfers;Hygiene/Grooming;Stairs    Examination-Participation Restrictions Cleaning;Yard Work;Community Activity;Laundry    Stability/Clinical Decision Making Stable/Uncomplicated    Rehab Potential Good    PT Frequency 2x / week    PT Duration 12 weeks    PT Treatment/Interventions ADLs/Self Care Home Management;Cryotherapy;Electrical Stimulation;Moist Heat;Gait training;Functional  mobility training;Stair training;Balance training;Therapeutic exercise;Therapeutic activities;Neuromuscular re-education;Patient/family education;Manual techniques;Scar mobilization;Passive range of motion;Joint Manipulations;Dry needling    PT Next Visit Plan Progress LE strength and mobility as tolerated. See pt carryover with correct R knee positioning    PT Home Exercise Plan More formal HEP to be provided at second session    Consulted and Agree with Plan of Care Patient             Patient will benefit from skilled therapeutic intervention in order to improve the following deficits and impairments:  Abnormal gait, Decreased activity tolerance, Decreased endurance, Decreased range of motion, Decreased skin integrity, Decreased strength, Hypomobility, Impaired perceived functional ability, Impaired sensation, Improper body mechanics, Pain, Decreased balance, Decreased mobility, Difficulty walking, Increased edema, Impaired flexibility, Obesity  Visit Diagnosis: Acute pain of right knee  Muscle weakness (generalized)  Stiffness of right knee, not elsewhere classified  Difficulty in walking, not elsewhere classified  Chronic pain of right knee  Pain in left hip     Problem List Patient Active Problem List   Diagnosis Date Noted   History of total knee arthroplasty, right 02/25/2021   Intestinal metaplasia of gastric cardia 11/07/2020   Mild intermittent asthma without complication [id number]   Shortness of breath 08/08/2020   Vaginal candidiasis 08/08/2020   Atherosclerosis of aorta (HCC) 07/09/2020   Acute diverticulitis 05/13/2020   Fever blister 10/15/2019   Cerumen in auditory canal on examination 10/15/2019   Bilateral hearing loss 10/15/2019   Need for vaccination against Streptococcus pneumoniae using pneumococcal conjugate vaccine 7 10/15/2019   Encounter for screening mammogram for malignant neoplasm of breast 10/15/2019   Colon cancer screening 10/15/2019    Weakness 09/02/2019   Headache disorder 09/02/2019   Stage 3b chronic kidney disease ([place]) 08/10/2019   Elevated erythrocyte sedimentation rate 11/02/2018   Lumbar  spondylosis 11/02/2018   PVC's (premature ventricular contractions) 10/18/2018   Primary osteoarthritis of right knee 09/15/2018   Encounter for pre-operative examination 09/15/2018   Obstructive sleep apnea 09/15/2018   Calculus of gallbladder without cholecystitis without obstruction 09/15/2018   Calculus of gallbladder with cholecystitis without biliary obstruction 01/27/2018   Restless leg syndrome 01/27/2018   Generalized abdominal pain 01/11/2018   Cyst of pancreas 01/11/2018   Other specified disorders of kidney and ureter 01/11/2018   Primary insomnia 01/11/2018   Paroxysmal atrial fibrillation ([place]) 11/12/2017   Essential hypertension 10/25/2017   Mixed hyperlipidemia 10/25/2017   Low back pain at multiple sites 10/25/2017   Dysuria 10/25/2017   Encounter for general adult medical examination with abnormal findings 10/25/2017   Vitamin D deficiency 10/25/2017   Near syncope    Symptomatic bradycardia 10/19/2017   Carpal tunnel syndrome 10/12/2017   Primary osteoarthritis of left knee 07/09/2017   History of total knee arthroplasty 07/06/2017   Chest pain 06/29/2017   Postop check 02/25/2017   Impingement syndrome of shoulder region 01/20/2017   Neck pain 01/20/2017   Obesity (BMI 35.0-39.9 without comorbidity) 07/15/2016   Endometrial polyp 07/15/2016   Asymptomatic postmenopausal state 07/15/2016   Morbid obesity ([place]) 07/15/2016   Family history of breast cancer in first degree relative 07/15/2016   Family history of ovarian cancer 07/15/2016   Knee pain 07/04/2016   Bilateral leg pain 06/24/2016   Sherryll Burger, SPT  Student physical therapist under direct supervision of licensed physical therapists during the entirety of the session.   Everlean Alstrom. Graylon Good, PT, DPT 04/22/21, 3:25 PM   [facility] [address], [place], [address] Phone: [phone]   Fax:  [phone]  Name: Whitney [name] MRN: [id number] Date of Birth: 15-Aug-1950

## 2021-04-24 ENCOUNTER — Encounter: Payer: Self-pay | Admitting: Physical Therapy

## 2021-04-24 ENCOUNTER — Ambulatory Visit: Payer: Medicare Other | Admitting: Physical Therapy

## 2021-04-24 DIAGNOSIS — M6281 Muscle weakness (generalized): Secondary | ICD-10-CM | POA: Diagnosis not present

## 2021-04-24 DIAGNOSIS — G8929 Other chronic pain: Secondary | ICD-10-CM | POA: Diagnosis not present

## 2021-04-24 DIAGNOSIS — R262 Difficulty in walking, not elsewhere classified: Secondary | ICD-10-CM | POA: Diagnosis not present

## 2021-04-24 DIAGNOSIS — M25561 Pain in right knee: Secondary | ICD-10-CM | POA: Diagnosis not present

## 2021-04-24 DIAGNOSIS — M25552 Pain in left hip: Secondary | ICD-10-CM | POA: Diagnosis not present

## 2021-04-24 DIAGNOSIS — M25661 Stiffness of right knee, not elsewhere classified: Secondary | ICD-10-CM | POA: Diagnosis not present

## 2021-04-24 NOTE — Therapy (Signed)
[facility] [address], [place], [address] Phone: [phone]   Fax:  [phone]  Physical Therapy Treatment  Patient Details  Name: Whitney [name] MRN: [id number] Date of Birth: 27-Apr-1951 Referring Provider (PT): Kurtis Bushman, MD.   Encounter Date: 04/24/2021   PT End of Session - 04/24/21 1626     Visit Number 7    Number of Visits 24    Date for PT Re-Evaluation 06/11/21    Authorization Type [employer] MEDICARE (Referring periord from 03/19/2021)    Progress Note Due on Visit 10    PT Start Time 1300    PT Stop Time 1343    PT Time Calculation (min) 43 min    Activity Tolerance Patient tolerated treatment well    Behavior During Therapy [facility] for tasks assessed/performed             Past Medical History:  Diagnosis Date   Abnormal Pap smear of cervix    Pt states she had colposcopy    Anxiety    Aortic atherosclerosis ([place])    Arthritis    CAD (coronary artery disease)    Chronic kidney disease    s/p LEFT nephrectomy   Chronic lower back pain    COVID    Depression    Diverticulitis    Dyspnea    History of methicillin resistant staphylococcus aureus (MRSA) 12/21/2020   nasal swab pcr   Hx of unilateral nephrectomy 1979   Left Nephrectomy   Hypertension    Lower extremity edema    PAF (paroxysmal atrial fibrillation) (HCC)    Palpitations    PMB (postmenopausal bleeding)    Restless leg syndrome    Sleep apnea    has no cpap   Symptomatic bradycardia    Vitamin D deficiency     Past Surgical History:  Procedure Laterality Date   BIOPSY  10/31/2019   Procedure: BIOPSY;  Surgeon: Irving Copas., MD;  Location: [facility] ENDOSCOPY;  Service: Gastroenterology;;   CARDIAC CATHETERIZATION     CHOLECYSTECTOMY     COLONOSCOPY WITH PROPOFOL N/A 06/13/2019   Procedure: COLONOSCOPY WITH PROPOFOL;  Surgeon: [name], MD;  Location: [facility] ENDOSCOPY;  Service: Gastroenterology;   Laterality: N/A;   COLONOSCOPY WITH PROPOFOL N/A 08/12/2019   Procedure: COLONOSCOPY WITH PROPOFOL;  Surgeon: [name], MD;  Location: [facility] ENDOSCOPY;  Service: Gastroenterology;  Laterality: N/A;   COLONOSCOPY WITH PROPOFOL N/A 09/19/2019   Procedure: COLONOSCOPY WITH PROPOFOL;  Surgeon: [name], MD;  Location: [facility] ENDOSCOPY;  Service: Gastroenterology;  Laterality: N/A;   DILATION AND CURETTAGE OF UTERUS     ESOPHAGOGASTRODUODENOSCOPY (EGD) WITH PROPOFOL N/A 06/13/2019   Procedure: ESOPHAGOGASTRODUODENOSCOPY (EGD) WITH PROPOFOL;  Surgeon: [name], MD;  Location: [facility] ENDOSCOPY;  Service: Gastroenterology;  Laterality: N/A;  Pt will go for COVID test on 25-0-53 as she uses public transportation and will be in the area on that day.    ESOPHAGOGASTRODUODENOSCOPY (EGD) WITH PROPOFOL N/A 10/31/2019   Procedure: ESOPHAGOGASTRODUODENOSCOPY (EGD) WITH PROPOFOL;  Surgeon: Rush Landmark Telford Nab., MD;  Location: [place];  Service: Gastroenterology;  Laterality: N/A;   HEMOSTASIS CLIP PLACEMENT  10/31/2019   Procedure: HEMOSTASIS CLIP PLACEMENT;  Surgeon: Irving Copas., MD;  Location: [place];  Service: Gastroenterology;;   HERNIA REPAIR     HYSTEROSCOPY WITH D & C N/A 02/16/2017   Procedure: DILATATION AND CURETTAGE /HYSTEROSCOPY;  Surgeon: Brayton Mars, MD;  Location: [facility] ORS;  Service: Gynecology;  Laterality: N/A;   JOINT REPLACEMENT Left    knee   kidney removal Left    KIDNEY SURGERY Left 1979   left kidney removed   LEFT HEART CATH AND CORONARY ANGIOGRAPHY N/A 10/21/2017   Procedure: LEFT HEART CATH AND CORONARY ANGIOGRAPHY;  Surgeon: Teodoro Spray, MD;  Location: [facility];  Service: Cardiovascular;  Laterality: N/A;   POLYPECTOMY  10/31/2019   Procedure: POLYPECTOMY;  Surgeon: Rush Landmark Telford Nab., MD;  Location: [place];  Service: Gastroenterology;;   TOTAL KNEE ARTHROPLASTY Left 07/06/2017   Procedure: TOTAL KNEE ARTHROPLASTY;  Surgeon:  Lovell Sheehan, MD;  Location: [facility] ORS;  Service: Orthopedics;  Laterality: Left;   TOTAL KNEE ARTHROPLASTY Right 02/25/2021   Procedure: TOTAL KNEE ARTHROPLASTY;  Surgeon: Lovell Sheehan, MD;  Location: [facility] ORS;  Service: Orthopedics;  Laterality: Right;   UPPER ESOPHAGEAL ENDOSCOPIC ULTRASOUND (EUS) N/A 10/31/2019   Procedure: UPPER ESOPHAGEAL ENDOSCOPIC ULTRASOUND (EUS);  Surgeon: Irving Copas., MD;  Location: [place];  Service: Gastroenterology;  Laterality: N/A;   WRIST ARTHROSCOPY Left 1970s   XI ROBOTIC ASSISTED VENTRAL HERNIA N/A 12/01/2019   Procedure: XI ROBOTIC ASSISTED VENTRAL HERNIA;  Surgeon: Jules Husbands, MD;  Location: [facility] ORS;  Service: General;  Laterality: N/A;    There were no vitals filed for this visit.   Subjective Assessment - 04/24/21 1348     Subjective Patient is doing well and denies pain in R knee. She has not had to take prescribed pain medication to manage her pain for the past week, but patient is taking tylenol to manage R knee pain. Patient reports HEP compliance. She states that her blood pressure was 189/92  mmHg this morning after taking bp medication.    Pertinent History Patient is a 70 year old female who presents to outpatient physical therapy with a referral s/p R TKA (02/25/2021). This patient's chief complaints consist of R knee and leg pain and limited ROM, leading to the following functional deficits: difficulty with walking, standing, standing from sitting, going up/down stairs, bed mobility, getting in the shower/tub, getting out of performing ADLs such as sweeping, cooking, cleaning, doing dishes. Relevant past medical history and comorbidities include: asthma, sleep apnea, HTN, CAD, Afib, Anxiety, depression, GERD, renal insufficiency, arthritis, and CKD, L TKA, obesity. Patient denies, history of cancer, stroke, seizure, major lung or heart problems, unexplained weight loss, numbness in groin, bowel or bladder changes.     Limitations Standing;Walking;House hold activities;Other (comment);Lifting;Sitting   Difficulty with walking, standing, standing from sitting, going up/down stairs, bed mobility, getting in the shower/tub, getting out of performing ADLs such as sweeping, cooking, cleaning, doing dishes   How long can you walk comfortably? 2 minutes    Patient Stated Goals "walk without a walker and without a cane"    Currently in Pain? No/denies    Pain Onset Other (comment)   Surgical pain onset 02/25/2021. Radiating R leg pain occured prior to surgery            OBJECTIVE:  6 minute walk test: Patient ambulated 1000 feet with RW and CGA. Patient reports L leg buckling 1 time during test, but buckling was not noted by PT.   5 x sit to stand: 16 seconds. Patient did not use UE while performing test.    VITALS:  Pre 6 minute walk test SpO2: 96% on RA HR: 78 bmp  BP: 169/90 mmHg. Taken on L wrist.   Post 6 minute walk test SpO2: 97%  on RA HR: 100 bpm   TREATMENT:  Therapeutic exercise: to centralize symptoms and improve ROM, strength, muscular endurance, and activity tolerance required for successful completion of functional activities.   - Objective measurements completed on examination: See above findings.   - seated single leg press with cable, 2x15 with 45lb, 2x15 with 65lbs with L leg. Mild R anterior leg discomfort noted at end of second set.  - seated double leg press with cable 1x15 with 65lb cable, 1x20 with 75lb cable.  - sit to stands without UE support, 1x10    Plan to work on walking with a cane at the next appointment.    Pt required multimodal cuing for proper technique and to facilitate improved neuromuscular control, strength, range of motion, and functional ability resulting in improved performance and form.      HOME EXERCISE PLAN Access Code: [email or web address] URL: [email or web address] Date: 04/24/2021 Prepared by: Rosita Kea  Exercises Sit to Stand - 3 sets -  10 reps Seated Long Arc Quad - 3 sets - 20 reps - 5 hold Seated Knee Flexion Stretch - 3 sets - 30 seconds. hold       PT Education - 04/24/21 1626     Education Details exercise purpose/form. HEP handout    Person(s) Educated Patient    Methods Explanation;Demonstration;Tactile cues;Verbal cues    Comprehension Verbalized understanding;Returned demonstration;Tactile cues required;Verbal cues required              PT Short Term Goals - 04/10/21 1447       PT SHORT TERM GOAL #1   Title Patient will be independent with inital home exercise program to improve strength/mobility and functional independence with ADLs.    Baseline Initial HEP provided at eval - more formal HEP to be provided at second visit.    Time 3    Period Weeks    Status Achieved    Target Date 04/09/21               PT Long Term Goals - 04/24/21 1628       PT LONG TERM GOAL #1   Title Patient will be independent with long term home exercise program to improve strength/mobility and functional independence with ADLs.    Baseline Initial HEP provided at eval - more formal HEP to be provided at second visit (03/19/2021); currently participating in appropriate HEP (04/24/2021);   TARGET DATE FOR ALL LONG TERM GOALS: 06/11/2021   Time 12    Period Weeks    Status Partially Met   TARGET DATE FOR ALL LONG TERM GOALS: 06/11/2021     PT LONG TERM GOAL #2   Title Demonstrate improved FOTO score by 10 units or greater to demonstrate improvement in overall condition and self-reported functional ability.    Baseline 58 (03/19/2021)    Time 12    Period Weeks    Status On-going      PT LONG TERM GOAL #3   Title Pt will increase R knee extension/flexion strength to at least a 5/5 MMT grade without pain in order to demonstrate improvement in strength and function    Baseline 3+ strength limited by pain.    Status On-going      PT LONG TERM GOAL #4   Title Patient will ambulate equal or greater than 1000 feet  on 6 Minute Walk Test with no AD to demonstrate improved community and household mobility.    Baseline Patient unable to walk greater than 2 minutes (03/19/2021); ambulated  1000 feet with RW and CGA. L leg buckled one time per patient report. (04/24/2021)    Time 12    Period Weeks    Status Partially Met      PT LONG TERM GOAL #5   Title Patient will improve R knee flexion to 120 degrees or greater to demonstrate improvements in mobility and community ambulation without compensation.    Baseline 93 degrees (03/19/2021)    Time 12    Period Weeks    Status On-going      PT LONG TERM GOAL #6   Title Patient will improve R knee extension to 0 degrees or greater to demonstrate improvements in mobility and community ambulation without compensation.    Baseline -11 degrees (03/19/2021)    Time 12    Period Weeks    Status On-going      PT LONG TERM GOAL #7   Title Patient will reduce 5 Time Sit To Stand to <15 seconds to reduce fall risk and demonstrate improved transfer/gait ability.    Baseline 16 seconds without UE support (04/24/2021).    Time 12    Period Weeks    Status Partially Met                   Plan - 04/24/21 1631     Clinical Impression Statement Patient tolerated treatment well. Patient demonstrated improvements in community ambulation, activity tolerance, and mobility ambulating 1000 feet with RW and CGA assist during 6 minute walk test today. However, patient continues to report bilateral knee buckling during ambulation which puts patient at increased risk for falls. Patient has been advised to continue use of RW at home and in the community to maximize patient safety at this time. Patient's vitals were monitored during this session to maximize patient safety during treatment after patient reported elevated BP this morning. Patient will continue to benefit from skilled physical therapy intervention to address impairments and maximize functional mobility.    Personal  Factors and Comorbidities Age;Comorbidity 3+;Past/Current Experience    Comorbidities asthma, sleep apnea, HTN, CAD, Afib, Anxiety, depression, GERD, renal insufficiency, arthritis, and CKD, L TKA, obesity    Examination-Activity Limitations Bathing;Sit;Stand;Bed Mobility;Locomotion Level;Bend;Dressing;Squat;Transfers;Hygiene/Grooming;Stairs    Examination-Participation Restrictions Cleaning;Yard Work;Community Activity;Laundry    Stability/Clinical Decision Making Stable/Uncomplicated    Rehab Potential Good    PT Frequency 2x / week    PT Duration 12 weeks    PT Treatment/Interventions ADLs/Self Care Home Management;Cryotherapy;Electrical Stimulation;Moist Heat;Gait training;Functional mobility training;Stair training;Balance training;Therapeutic exercise;Therapeutic activities;Neuromuscular re-education;Patient/family education;Manual techniques;Scar mobilization;Passive range of motion;Joint Manipulations;Dry needling    PT Next Visit Plan Progress LE strength and mobility as tolerated. See pt carryover with correct R knee positioning    PT Home Exercise Plan More formal HEP to be provided at second session    Consulted and Agree with Plan of Care Patient             Patient will benefit from skilled therapeutic intervention in order to improve the following deficits and impairments:  Abnormal gait, Decreased activity tolerance, Decreased endurance, Decreased range of motion, Decreased skin integrity, Decreased strength, Hypomobility, Impaired perceived functional ability, Impaired sensation, Improper body mechanics, Pain, Decreased balance, Decreased mobility, Difficulty walking, Increased edema, Impaired flexibility, Obesity  Visit Diagnosis: Acute pain of right knee  Muscle weakness (generalized)  Stiffness of right knee, not elsewhere classified  Difficulty in walking, not elsewhere classified     Problem List Patient Active Problem List   Diagnosis Date Noted   History  of  total knee arthroplasty, right 02/25/2021   Intestinal metaplasia of gastric cardia 11/07/2020   Mild intermittent asthma without complication 02/22/8287   Shortness of breath 08/08/2020   Vaginal candidiasis 08/08/2020   Atherosclerosis of aorta ([place]) 07/09/2020   Acute diverticulitis 05/13/2020   Fever blister 10/15/2019   Cerumen in auditory canal on examination 10/15/2019   Bilateral hearing loss 10/15/2019   Need for vaccination against Streptococcus pneumoniae using pneumococcal conjugate vaccine 7 10/15/2019   Encounter for screening mammogram for malignant neoplasm of breast 10/15/2019   Colon cancer screening 10/15/2019   Weakness 09/02/2019   Headache disorder 09/02/2019   Stage 3b chronic kidney disease ([place]) 08/10/2019   Elevated erythrocyte sedimentation rate 11/02/2018   Lumbar spondylosis 11/02/2018   PVC's (premature ventricular contractions) 10/18/2018   Primary osteoarthritis of right knee 09/15/2018   Encounter for pre-operative examination 09/15/2018   Obstructive sleep apnea 09/15/2018   Calculus of gallbladder without cholecystitis without obstruction 09/15/2018   Calculus of gallbladder with cholecystitis without biliary obstruction 01/27/2018   Restless leg syndrome 01/27/2018   Generalized abdominal pain 01/11/2018   Cyst of pancreas 01/11/2018   Other specified disorders of kidney and ureter 01/11/2018   Primary insomnia 01/11/2018   Paroxysmal atrial fibrillation ([place]) 11/12/2017   Essential hypertension 10/25/2017   Mixed hyperlipidemia 10/25/2017   Low back pain at multiple sites 10/25/2017   Dysuria 10/25/2017   Encounter for general adult medical examination with abnormal findings 10/25/2017   Vitamin D deficiency 10/25/2017   Near syncope    Symptomatic bradycardia 10/19/2017   Carpal tunnel syndrome 10/12/2017   Primary osteoarthritis of left knee 07/09/2017   History of total knee arthroplasty 07/06/2017   Chest pain 06/29/2017   Postop  check 02/25/2017   Impingement syndrome of shoulder region 01/20/2017   Neck pain 01/20/2017   Obesity (BMI 35.0-39.9 without comorbidity) 07/15/2016   Endometrial polyp 07/15/2016   Asymptomatic postmenopausal state 07/15/2016   Morbid obesity ([place]) 07/15/2016   Family history of breast cancer in first degree relative 07/15/2016   Family history of ovarian cancer 07/15/2016   Knee pain 07/04/2016   Bilateral leg pain 06/24/2016    Sherryll Burger, SPT  Student physical therapist under direct supervision of licensed physical therapists during the entirety of the session.   Everlean Alstrom. Graylon Good, PT, DPT 04/24/21, 4:46 PM  [facility] [address], [place], [address] Phone: [phone]   Fax:  [phone]  Name: Whitney [name] MRN: [id number] Date of Birth: February 28, 1951

## 2021-04-29 ENCOUNTER — Ambulatory Visit: Payer: Medicare Other | Admitting: Physical Therapy

## 2021-04-29 DIAGNOSIS — G8929 Other chronic pain: Secondary | ICD-10-CM | POA: Diagnosis not present

## 2021-04-29 DIAGNOSIS — M25552 Pain in left hip: Secondary | ICD-10-CM | POA: Diagnosis not present

## 2021-04-29 DIAGNOSIS — R262 Difficulty in walking, not elsewhere classified: Secondary | ICD-10-CM | POA: Diagnosis not present

## 2021-04-29 DIAGNOSIS — M25561 Pain in right knee: Secondary | ICD-10-CM

## 2021-04-29 DIAGNOSIS — M25661 Stiffness of right knee, not elsewhere classified: Secondary | ICD-10-CM | POA: Diagnosis not present

## 2021-04-29 DIAGNOSIS — M6281 Muscle weakness (generalized): Secondary | ICD-10-CM | POA: Diagnosis not present

## 2021-04-29 NOTE — Therapy (Signed)
[facility] [address], [place], [address] Phone: [phone]   Fax:  [phone]  Physical Therapy Treatment  Patient Details  Name: Whitney [name] MRN: [id number] Date of Birth: [birth year] Referring Provider (PT): Kurtis Bushman, MD.   Encounter Date: 04/29/2021   PT End of Session - 04/29/21 1307     Visit Number 8    Number of Visits 24    Date for PT Re-Evaluation 06/11/21    Authorization Type [employer] MEDICARE (Referring periord from 03/19/2021)    Progress Note Due on Visit 10    PT Start Time 1300    PT Stop Time 1340    PT Time Calculation (min) 40 min    Equipment Utilized During Treatment Gait belt    Activity Tolerance Patient tolerated treatment well    Behavior During Therapy WFL for tasks assessed/performed             Past Medical History:  Diagnosis Date   Abnormal Pap smear of cervix    Pt states she had colposcopy    Anxiety    Aortic atherosclerosis (HCC)    Arthritis    CAD (coronary artery disease)    Chronic kidney disease    s/p LEFT nephrectomy   Chronic lower back pain    COVID    Depression    Diverticulitis    Dyspnea    History of methicillin resistant staphylococcus aureus (MRSA) 12/21/2020   nasal swab pcr   Hx of unilateral nephrectomy 1979   Left Nephrectomy   Hypertension    Lower extremity edema    PAF (paroxysmal atrial fibrillation) (HCC)    Palpitations    PMB (postmenopausal bleeding)    Restless leg syndrome    Sleep apnea    has no cpap   Symptomatic bradycardia    Vitamin D deficiency     Past Surgical History:  Procedure Laterality Date   BIOPSY  10/31/2019   Procedure: BIOPSY;  Surgeon: Irving Copas., MD;  Location: [place];  Service: Gastroenterology;;   CARDIAC CATHETERIZATION     CHOLECYSTECTOMY     COLONOSCOPY WITH PROPOFOL N/A 06/13/2019   Procedure: COLONOSCOPY WITH PROPOFOL;  Surgeon: [name], MD;  Location:  [facility] ENDOSCOPY;  Service: Gastroenterology;  Laterality: N/A;   COLONOSCOPY WITH PROPOFOL N/A 08/12/2019   Procedure: COLONOSCOPY WITH PROPOFOL;  Surgeon: [name], MD;  Location: [facility] ENDOSCOPY;  Service: Gastroenterology;  Laterality: N/A;   COLONOSCOPY WITH PROPOFOL N/A 09/19/2019   Procedure: COLONOSCOPY WITH PROPOFOL;  Surgeon: [name], MD;  Location: [facility] ENDOSCOPY;  Service: Gastroenterology;  Laterality: N/A;   DILATION AND CURETTAGE OF UTERUS     ESOPHAGOGASTRODUODENOSCOPY (EGD) WITH PROPOFOL N/A 06/13/2019   Procedure: ESOPHAGOGASTRODUODENOSCOPY (EGD) WITH PROPOFOL;  Surgeon: [name], MD;  Location: [facility] ENDOSCOPY;  Service: Gastroenterology;  Laterality: N/A;  Pt will go for COVID test on 60-1-09 as she uses public transportation and will be in the area on that day.    ESOPHAGOGASTRODUODENOSCOPY (EGD) WITH PROPOFOL N/A 10/31/2019   Procedure: ESOPHAGOGASTRODUODENOSCOPY (EGD) WITH PROPOFOL;  Surgeon: Rush Landmark Telford Nab., MD;  Location: [place];  Service: Gastroenterology;  Laterality: N/A;   HEMOSTASIS CLIP PLACEMENT  10/31/2019   Procedure: HEMOSTASIS CLIP PLACEMENT;  Surgeon: Irving Copas., MD;  Location: [place];  Service: Gastroenterology;;   HERNIA REPAIR     HYSTEROSCOPY WITH D & C N/A 02/16/2017   Procedure: DILATATION AND CURETTAGE /HYSTEROSCOPY;  Surgeon: Brayton Mars,  MD;  Location: [facility];  Service: Gynecology;  Laterality: N/A;   JOINT REPLACEMENT Left    knee   kidney removal Left    KIDNEY SURGERY Left 1979   left kidney removed   LEFT HEART CATH AND CORONARY ANGIOGRAPHY N/A 10/21/2017   Procedure: LEFT HEART CATH AND CORONARY ANGIOGRAPHY;  Surgeon: Teodoro Spray, MD;  Location: [place] [facility];  Service: Cardiovascular;  Laterality: N/A;   POLYPECTOMY  10/31/2019   Procedure: POLYPECTOMY;  Surgeon: Rush Landmark Telford Nab., MD;  Location: [place];  Service: Gastroenterology;;   TOTAL KNEE ARTHROPLASTY Left 07/06/2017    Procedure: TOTAL KNEE ARTHROPLASTY;  Surgeon: Lovell Sheehan, MD;  Location: [facility];  Service: Orthopedics;  Laterality: Left;   TOTAL KNEE ARTHROPLASTY Right 02/25/2021   Procedure: TOTAL KNEE ARTHROPLASTY;  Surgeon: Lovell Sheehan, MD;  Location: [facility];  Service: Orthopedics;  Laterality: Right;   UPPER ESOPHAGEAL ENDOSCOPIC ULTRASOUND (EUS) N/A 10/31/2019   Procedure: UPPER ESOPHAGEAL ENDOSCOPIC ULTRASOUND (EUS);  Surgeon: Irving Copas., MD;  Location: [place];  Service: Gastroenterology;  Laterality: N/A;   WRIST ARTHROSCOPY Left 1970s   XI ROBOTIC ASSISTED VENTRAL HERNIA N/A 12/01/2019   Procedure: XI ROBOTIC ASSISTED VENTRAL HERNIA;  Surgeon: Jules Husbands, MD;  Location: [facility];  Service: General;  Laterality: N/A;    There were no vitals filed for this visit.   Subjective Assessment - 04/29/21 1302     Subjective Patient reports she has 2/10 pain in her R thigh/knee. She took a pain pill before she came to PT. States she had some soreness two days after last PT session. Has been walking a bit with the [facility] but notes some buckling in her R knee. Arrived using RW and brought SPC from home. HEP is going well including sit to stand exercise.    Pertinent History Patient is a 70 year old female who presents to outpatient physical therapy with a referral s/p R TKA (02/25/2021). This patient's chief complaints consist of R knee and leg pain and limited ROM, leading to the following functional deficits: difficulty with walking, standing, standing from sitting, going up/down stairs, bed mobility, getting in the shower/tub, getting out of performing ADLs such as sweeping, cooking, cleaning, doing dishes. Relevant past medical history and comorbidities include: asthma, sleep apnea, HTN, CAD, Afib, Anxiety, depression, GERD, renal insufficiency, arthritis, and CKD, L TKA, obesity. Patient denies, history of cancer, stroke, seizure, major lung or heart problems, unexplained weight loss,  numbness in groin, bowel or bladder changes.    Limitations Standing;Walking;House hold activities;Other (comment);Lifting;Sitting   Difficulty with walking, standing, standing from sitting, going up/down stairs, bed mobility, getting in the shower/tub, getting out of performing ADLs such as sweeping, cooking, cleaning, doing dishes   How long can you walk comfortably? 2 minutes    Patient Stated Goals "walk without a walker and without a cane"    Currently in Pain? Yes    Pain Score 2     Pain Location Knee    Pain Orientation Right    Pain Onset Other (comment)   Surgical pain onset 02/25/2021. Radiating R leg pain occured prior to surgery             OBJECTIVE BP 151/88 mmHg taken in sitting at left wrist, HR 78 bpm.     TREATMENT:  Therapeutic exercise: to centralize symptoms and improve ROM, strength, muscular endurance, and activity tolerance required for successful completion of functional activities.   - seated double  leg press with cable 2x20 with 75lb cable.  - seated single leg press with cable, 2x16/20 each side with 45lbs, 1x10 with 55# with R leg and 2x15 at 65lbs and 1x120at 55 lbs with L leg. Able to push 65 lbs for 2 reps (patient preference to attempt). Reports no pain just difficulty with increased weight on R LE. Weight/reps limited by PT to decrease chance of excessive irritation.  Pt reports slight and sudden headache at end of last set. BP measured and found to be within acceptable limites (see above).  - standing TKE R LE with black band, 5 second holds, B UE support, 1x20 - ambulation overground with SPC in L UE.and CGA - SBA: 1x100 feet flat surface around clinic with cuing for proper use of cane and coordination with LE s. PT adjusted cane to appropriate height. Ambulation overground from clinic to vehicle starting (down ramp and off curb) with SPC and transitioning to RW with CGA - SBA and cuing for use of SPC in correct hand. Pt reported buckling in R knee (not  observed by PT) and was able to recover without significant gait distrubance but pt felt she needed RW to finish walk to vehicle.  - Education on HEP including handout    Pt required multimodal cuing for proper technique and to facilitate improved neuromuscular control, strength, range of motion, and functional ability resulting in improved performance and form.      HOME EXERCISE PLAN Access Code: [email or web address] URL: [email or web address] Date: 04/29/2021 Prepared by: Rosita Kea  Exercises Sit to Stand - 3 sets - 10 reps Seated Long Arc Quad - 3 sets - 20 reps - 5 hold Seated Knee Flexion Stretch - 3 sets - 30 seconds. hold Standing Terminal Knee Extension with Resistance - 1-2 x daily - 20 reps - 5 seconds hold     PT Education - 04/29/21 1306     Education Details exercise purpose/form.    Person(s) Educated Patient    Methods Explanation;Demonstration;Tactile cues;Verbal cues    Comprehension Verbalized understanding;Returned demonstration;Verbal cues required;Tactile cues required;Need further instruction              PT Short Term Goals - 04/10/21 1447       PT SHORT TERM GOAL #1   Title Patient will be independent with inital home exercise program to improve strength/mobility and functional independence with ADLs.    Baseline Initial HEP provided at eval - more formal HEP to be provided at second visit.    Time 3    Period Weeks    Status Achieved    Target Date 04/09/21               PT Long Term Goals - 04/24/21 1628       PT LONG TERM GOAL #1   Title Patient will be independent with long term home exercise program to improve strength/mobility and functional independence with ADLs.    Baseline Initial HEP provided at eval - more formal HEP to be provided at second visit (03/19/2021); currently participating in appropriate HEP (04/24/2021);   TARGET DATE FOR ALL LONG TERM GOALS: 06/11/2021   Time 12    Period Weeks    Status Partially Met    TARGET DATE FOR ALL LONG TERM GOALS: 06/11/2021     PT LONG TERM GOAL #2   Title Demonstrate improved FOTO score by 10 units or greater to demonstrate improvement in overall condition and self-reported functional ability.    Baseline 58 (03/19/2021)  Time 12    Period Weeks    Status On-going      PT LONG TERM GOAL #3   Title Pt will increase R knee extension/flexion strength to at least a 5/5 MMT grade without pain in order to demonstrate improvement in strength and function    Baseline 3+ strength limited by pain.    Status On-going      PT LONG TERM GOAL #4   Title Patient will ambulate equal or greater than 1000 feet on 6 Minute Walk Test with no AD to demonstrate improved community and household mobility.    Baseline Patient unable to walk greater than 2 minutes (03/19/2021); ambulated 1000 feet with RW and CGA. L leg buckled one time per patient report. (04/24/2021)    Time 12    Period Weeks    Status Partially Met      PT LONG TERM GOAL #5   Title Patient will improve R knee flexion to 120 degrees or greater to demonstrate improvements in mobility and community ambulation without compensation.    Baseline 93 degrees (03/19/2021)    Time 12    Period Weeks    Status On-going      PT LONG TERM GOAL #6   Title Patient will improve R knee extension to 0 degrees or greater to demonstrate improvements in mobility and community ambulation without compensation.    Baseline -11 degrees (03/19/2021)    Time 12    Period Weeks    Status On-going      PT LONG TERM GOAL #7   Title Patient will reduce 5 Time Sit To Stand to <15 seconds to reduce fall risk and demonstrate improved transfer/gait ability.    Baseline 16 seconds without UE support (04/24/2021).    Time 12    Period Weeks    Status Partially Met                   Plan - 04/29/21 1320     Clinical Impression Statement Patient continues to make progress towards goals and was able to progress to ambulation with  SPC this session. Continue to recommend RW on unven surfaces due to report of R knee buckling. No observed true buckling in clinic but does giveway slightly and patient is able to catch herself with step strategy. Added TKE exercise to improve knee stability. Patient would benefit from continued management of limiting condition by skilled physical therapist to address remaining impairments and functional limitations to work towards stated goals and return to PLOF or maximal functional independence.    Personal Factors and Comorbidities Age;Comorbidity 3+;Past/Current Experience    Comorbidities asthma, sleep apnea, HTN, CAD, Afib, Anxiety, depression, GERD, renal insufficiency, arthritis, and CKD, L TKA, obesity    Examination-Activity Limitations Bathing;Sit;Stand;Bed Mobility;Locomotion Level;Bend;Dressing;Squat;Transfers;Hygiene/Grooming;Stairs    Examination-Participation Restrictions Cleaning;Yard Work;Community Activity;Laundry    Stability/Clinical Decision Making Stable/Uncomplicated    Rehab Potential Good    PT Frequency 2x / week    PT Duration 12 weeks    PT Treatment/Interventions ADLs/Self Care Home Management;Cryotherapy;Electrical Stimulation;Moist Heat;Gait training;Functional mobility training;Stair training;Balance training;Therapeutic exercise;Therapeutic activities;Neuromuscular re-education;Patient/family education;Manual techniques;Scar mobilization;Passive range of motion;Joint Manipulations;Dry needling    PT Next Visit Plan Progress LE strength and mobility as tolerated. See pt carryover with correct R knee positioning    PT Home Exercise Plan Medbridge Access Code: [email or web address]    Consulted and Agree with Plan of Care Patient             Patient will benefit  from skilled therapeutic intervention in order to improve the following deficits and impairments:  Abnormal gait, Decreased activity tolerance, Decreased endurance, Decreased range of motion, Decreased skin integrity,  Decreased strength, Hypomobility, Impaired perceived functional ability, Impaired sensation, Improper body mechanics, Pain, Decreased balance, Decreased mobility, Difficulty walking, Increased edema, Impaired flexibility, Obesity  Visit Diagnosis: Acute pain of right knee  Muscle weakness (generalized)  Stiffness of right knee, not elsewhere classified  Difficulty in walking, not elsewhere classified     Problem List Patient Active Problem List   Diagnosis Date Noted   History of total knee arthroplasty, right 02/25/2021   Intestinal metaplasia of gastric cardia 11/07/2020   Mild intermittent asthma without complication 78/29/5621   Shortness of breath 08/08/2020   Vaginal candidiasis 08/08/2020   Atherosclerosis of aorta (HCC) 07/09/2020   Acute diverticulitis 05/13/2020   Fever blister 10/15/2019   Cerumen in auditory canal on examination 10/15/2019   Bilateral hearing loss 10/15/2019   Need for vaccination against Streptococcus pneumoniae using pneumococcal conjugate vaccine 7 10/15/2019   Encounter for screening mammogram for malignant neoplasm of breast 10/15/2019   Colon cancer screening 10/15/2019   Weakness 09/02/2019   Headache disorder 09/02/2019   Stage 3b chronic kidney disease ([place]) 08/10/2019   Elevated erythrocyte sedimentation rate 11/02/2018   Lumbar spondylosis 11/02/2018   PVC's (premature ventricular contractions) 10/18/2018   Primary osteoarthritis of right knee 09/15/2018   Encounter for pre-operative examination 09/15/2018   Obstructive sleep apnea 09/15/2018   Calculus of gallbladder without cholecystitis without obstruction 09/15/2018   Calculus of gallbladder with cholecystitis without biliary obstruction 01/27/2018   Restless leg syndrome 01/27/2018   Generalized abdominal pain 01/11/2018   Cyst of pancreas 01/11/2018   Other specified disorders of kidney and ureter 01/11/2018   Primary insomnia 01/11/2018   Paroxysmal atrial fibrillation ([place])  11/12/2017   Essential hypertension 10/25/2017   Mixed hyperlipidemia 10/25/2017   Low back pain at multiple sites 10/25/2017   Dysuria 10/25/2017   Encounter for general adult medical examination with abnormal findings 10/25/2017   Vitamin D deficiency 10/25/2017   Near syncope    Symptomatic bradycardia 10/19/2017   Carpal tunnel syndrome 10/12/2017   Primary osteoarthritis of left knee 07/09/2017   History of total knee arthroplasty 07/06/2017   Chest pain 06/29/2017   Postop check 02/25/2017   Impingement syndrome of shoulder region 01/20/2017   Neck pain 01/20/2017   Obesity (BMI 35.0-39.9 without comorbidity) 07/15/2016   Endometrial polyp 07/15/2016   Asymptomatic postmenopausal state 07/15/2016   Morbid obesity ([place]) 07/15/2016   Family history of breast cancer in first degree relative 07/15/2016   Family history of ovarian cancer 07/15/2016   Knee pain 07/04/2016   Bilateral leg pain 06/24/2016   Everlean Alstrom. Graylon Good, PT, DPT 04/29/21, 1:54 PM   [facility] [address] [place], [address] Phone: [phone]   Fax:  [phone]  Name: Whitney [name] MRN: [id number] Date of Birth: 1951-01-08

## 2021-04-30 ENCOUNTER — Other Ambulatory Visit: Payer: Self-pay

## 2021-04-30 ENCOUNTER — Ambulatory Visit (INDEPENDENT_AMBULATORY_CARE_PROVIDER_SITE_OTHER): Payer: Medicare Other | Admitting: Gastroenterology

## 2021-04-30 ENCOUNTER — Encounter: Payer: Self-pay | Admitting: Gastroenterology

## 2021-04-30 VITALS — BP 179/90 | HR 68 | Temp 98.0°F | Ht 62.0 in | Wt 248.4 lb

## 2021-04-30 DIAGNOSIS — K5732 Diverticulitis of large intestine without perforation or abscess without bleeding: Secondary | ICD-10-CM | POA: Diagnosis not present

## 2021-04-30 DIAGNOSIS — K31A0 Gastric intestinal metaplasia, unspecified: Secondary | ICD-10-CM

## 2021-04-30 MED ORDER — NA SULFATE-K SULFATE-MG SULF 17.5-3.13-1.6 GM/177ML PO SOLN: 354.0000 mL | Freq: Once | ORAL | 0 refills | Status: AC

## 2021-04-30 NOTE — Progress Notes (Signed)
[name] MD, MRCP(U.K) [address]  [place]  [place], [place] [address]  Main: [phone]  Fax: [phone]   Primary Care Physician: Lavera Guise, MD  Primary Gastroenterologist:  Dr. [name]   Chief Complaint  Patient presents with   Diverticulitis    No symptoms     HPI: Whitney [name] is a 70 y.o. female  Summary of history :   Initially seen and referred on 11/02/2018 for abdominal pain.  Started after cholecystectomy   Abdominal CT scan in March 2019 showed  A small low-attenuation area within the body of the pancreas.     MRI of the abdomen in May 2019 which demonstrated benign liver abnormalities including multiple hemangiomas and cysts.  Gallstone.'s multiple small cystic foci throughout the pancreas.  Findings may represent small pseudocysts or areas of sidebranch duct ectasia.  Cystic neoplasm of the pancreas not excluded follow-up imaging in 24 months recommended.   10/19/2018 HIDA scan: Normal. 02/18/2019: H pylori breath test - negative 03/04/2019: MRCP- progression of the gall bladder polyp which is 10 mm in size . Pancreas lesion is stable repeat in 1 year . 06/13/2019 colonoscopy: Large quantity of stool seen in the rectum and sigmoid repeat colonoscopy recommended.   EGD.  Medium size hiatal hernia seen nodule seen just past the GE junction.  Biopsies of the stomach demonstrated H. pylori gastritis.   Underwent laparoscopic cholecystectomy with Dr. Dahlia Byes in November 2020 which resolved her longstanding abdominal pain   05/27/2019: CT scan of the abdomen and pelvis showed moderate supraumbilical midline ventral hernia containing inflamed flat and small amount of fluid.  Stable small pancreatic cysts.   09/19/2019: Colonoscopy: Poor prep and 3 polyps 4 to 6 mm were resected in the ascending colon with a cold snare.  Could not be retrieved since prep was poor    10/31/2019: EUS: Nodule at the GE junction was resected it was benign.  Biopsies of  stomach showed chronic gastritis with focal intestinal metaplasia.  Cystic lesions of the pancreas were noted suggestive of IPMN.      12/01/2019: Ventral hernia repair.   02/15/2020: H. pylori breath test: Negative. 05/14/2020: Descending colon sigmoid colon diverticulitis 10/02/2020: CT scan of the abdomen pelvis with contrast shows pancreas with a 1 cm hypodense lesion in the distal body similar to CT dated 2019 no new changes.  Acute diverticulitis in the distal descending sigmoid junction.  Treated with antibiotics   Interval history  01/29/2021-04/30/2021   4 weeks back she called our office and complained of abdominal pain similar to her diverticulitis and we sent in another prescription for antibiotics.   Presently doing well no complaints.  No abdominal pain.     Current Outpatient Medications  Medication Sig Dispense Refill   albuterol (VENTOLIN HFA) 108 (90 Base) MCG/ACT inhaler Inhale 2 puffs into the lungs every 4 (four) hours as needed for wheezing or shortness of breath. 1 each 3   amLODipine (NORVASC) 5 MG tablet Take 1 tablet (5 mg total) by mouth 2 (two) times daily. 60 tablet 2   aspirin 81 MG chewable tablet Chew 1 tablet (81 mg total) by mouth 2 (two) times daily. 60 tablet 0   carvedilol (COREG) 6.25 MG tablet Take 1 tablet (6.25 mg total) by mouth 2 (two) times daily with a meal. 60 tablet 3   Cholecalciferol (VITAMIN D) 50 MCG (2000 UT) tablet Take 2,000 Units by mouth in the morning and at bedtime.  diclofenac Sodium (VOLTAREN) 1 % GEL Apply 2 g topically daily as needed (Knee pain).     docusate sodium (COLACE) 100 MG capsule Take 1 capsule (100 mg total) by mouth 2 (two) times daily. 20 capsule 0   ezetimibe (ZETIA) 10 MG tablet Take 1 tablet (10 mg total) by mouth daily. (Patient taking differently: Take 10 mg by mouth every morning.) 90 tablet 3   flecainide (TAMBOCOR) 50 MG tablet Take 50 mg by mouth as needed.     gabapentin (NEURONTIN) 100 MG capsule Take 200  mg by mouth 2 (two) times daily.     hydrALAZINE (APRESOLINE) 25 MG tablet Take 1 tablet (25 mg total) by mouth 3 (three) times daily. For HTN 90 tablet 3   HYDROcodone-acetaminophen (NORCO) 7.5-325 MG tablet Take 1 tablet by mouth every 4 (four) hours as needed for moderate pain (pain score 7-10). 30 tablet 0   methocarbamol (ROBAXIN) 500 MG tablet Take 1 tablet (500 mg total) by mouth 4 (four) times daily. 60 tablet 0   orlistat (ALLI) 60 MG capsule Take 60 mg by mouth 3 (three) times daily with meals.     rOPINIRole (REQUIP) 0.5 MG tablet Take 0.5-1 mg by mouth 2 (two) times daily as needed (pain).     No current facility-administered medications for this visit.    Allergies as of 04/30/2021 - Review Complete 04/30/2021  Allergen Reaction Noted   Enalapril Swelling    Lisinopril Swelling 05/26/2016   Ace inhibitors Swelling 11/13/2020    ROS:  General: Negative for anorexia, weight loss, fever, chills, fatigue, weakness. ENT: Negative for hoarseness, difficulty swallowing , nasal congestion. CV: Negative for chest pain, angina, palpitations, dyspnea on exertion, peripheral edema.  Respiratory: Negative for dyspnea at rest, dyspnea on exertion, cough, sputum, wheezing.  GI: See history of present illness. GU:  Negative for dysuria, hematuria, urinary incontinence, urinary frequency, nocturnal urination.  Endo: Negative for unusual weight change.    Physical Examination:   BP (!) 179/90 (BP Location: Left Arm, Patient Position: Sitting, Cuff Size: Normal)   Pulse 68   Temp 98 F (36.7 C) (Oral)   Ht 5\' 2"  (1.575 m)   Wt 248 lb 6 oz (112.7 kg)   LMP 01/26/2017 Comment: age 33  BMI 45.43 kg/m   General: Well-nourished, well-developed in no acute distress.  Eyes: No icterus. Conjunctivae pink. Abdomen: Bowel sounds are normal, nontender, nondistended, no hepatosplenomegaly or masses, no abdominal bruits or hernia , no rebound or guarding.   Extremities: No lower extremity  edema. No clubbing or deformities. Neuro: Alert and oriented x 3.  Grossly intact. Skin: Warm and dry, no jaundice.   Psych: Alert and cooperative, normal mood and affect.   Imaging Studies: DG Bone Density  Result Date: 04/01/2021 EXAM: DUAL X-RAY ABSORPTIOMETRY (DXA) FOR BONE MINERAL DENSITY IMPRESSION: Your patient Whitney [name] completed a BMD test on 04/01/2021 using the [employer] (software version: 14.10) manufactured by [employer]. The following summarizes the results of our evaluation. Technologist:VLM PATIENT BIOGRAPHICAL: Name: Whitney [name], Whitney [name] Patient ID: [id number] Birth Date: 09/17/1950 Height: 62.0 in. Gender: Female Exam Date: 04/01/2021 Weight: 240.0 lbs. Indications: Postmenopausal, Rheumatoid Arthritis Fractures: Treatments: Albuterol, Vitamin D DENSITOMETRY RESULTS: Site         Region     Measured Date Measured Age WHO Classification Young Adult T-score BMD         %Change vs. Previous Significant Change (*) DualFemur Neck Right 04/01/2021 70.7 Normal -0.6 0.961  g/cm2 - - Left Forearm Radius 33% 04/01/2021 70.7 Normal 0.6 0.929 g/cm2 - - ASSESSMENT: The BMD measured at Femur Neck Right is 0.961 g/cm2 with a T-score of -0.6. This patient is considered normal according to [facility]) criteria. Lumbar spine was not utilized due to advanced degenerative changes. The scan quality is good. [facility]) criteria for post-menopausal, Caucasian Women: Normal:                   T-score at or above -1 SD Osteopenia/low bone mass: T-score between -1 and -2.5 SD Osteoporosis:             T-score at or below -2.5 SD RECOMMENDATIONS: 1. All patients should optimize calcium and vitamin D intake. 2. Consider FDA-approved medical therapies in postmenopausal women and men aged 50 years and older, based on the following: a. A hip or vertebral(clinical or morphometric) fracture b. T-score < -2.5 at the femoral neck or spine after appropriate  evaluation to exclude secondary causes c. Low bone mass (T-score between -1.0 and -2.5 at the femoral neck or spine) and a 10-year probability of a hip fracture > 3% or a 10-year probability of a major osteoporosis-related fracture > 20% based on the [place]-adapted WHO algorithm 3. Clinician judgment and/or patient preferences may indicate treatment for people with 10-year fracture probabilities above or below these levels FOLLOW-UP: People with diagnosed cases of osteoporosis or at high risk for fracture should have regular bone mineral density tests. For patients eligible for Medicare, routine testing is allowed once every 2 years. The testing frequency can be increased to one year for patients who have rapidly progressing disease, those who are receiving or discontinuing medical therapy to restore bone mass, or have additional risk factors. I have reviewed this report, and agree with the above findings. [facility] Electronically Signed   By: Rolm Baptise M.D.   On: 04/01/2021 15:54    Assessment and Plan:   Whitney [name] is a 70 y.o. y/o female here to follow up for acute diverticulitis which she had at the last office visit.  She also has gastric intestinal metaplasia requiring gastric mapping.  She has had 3 episodes of diverticulitis which have been treated with antibiotics.  Explained that if it were to recur repeatedly may need surgery on that side.  She will think about it.  We will proceed with her colonoscopy with a history of diverticulitis and prior attempts with poor prep, we will do a 2-day prep.  We will also proceed with EGD for gastric nightly due to prior history of gastric intestinal metaplasia.  I have discussed alternative options, risks & benefits,  which include, but are not limited to, bleeding, infection, perforation,respiratory complication & drug reaction.  The patient agrees with this plan & written consent will be obtained.     Dr [name]  MD,MRCP [facility]) Follow  up in as needed

## 2021-05-01 ENCOUNTER — Ambulatory Visit: Payer: Medicare Other | Admitting: Physical Therapy

## 2021-05-03 DIAGNOSIS — E782 Mixed hyperlipidemia: Secondary | ICD-10-CM | POA: Diagnosis not present

## 2021-05-03 DIAGNOSIS — I10 Essential (primary) hypertension: Secondary | ICD-10-CM | POA: Diagnosis not present

## 2021-05-03 DIAGNOSIS — M1712 Unilateral primary osteoarthritis, left knee: Secondary | ICD-10-CM | POA: Diagnosis not present

## 2021-05-06 ENCOUNTER — Ambulatory Visit: Payer: Medicare Other | Attending: Orthopedic Surgery | Admitting: Physical Therapy

## 2021-05-06 ENCOUNTER — Encounter: Payer: Self-pay | Admitting: Physical Therapy

## 2021-05-06 DIAGNOSIS — M6281 Muscle weakness (generalized): Secondary | ICD-10-CM | POA: Diagnosis not present

## 2021-05-06 DIAGNOSIS — M25661 Stiffness of right knee, not elsewhere classified: Secondary | ICD-10-CM | POA: Insufficient documentation

## 2021-05-06 DIAGNOSIS — M25561 Pain in right knee: Secondary | ICD-10-CM | POA: Insufficient documentation

## 2021-05-06 DIAGNOSIS — R262 Difficulty in walking, not elsewhere classified: Secondary | ICD-10-CM | POA: Diagnosis not present

## 2021-05-06 NOTE — Therapy (Signed)
[facility] [address] [place], [address] Phone: [phone]   Fax:  [phone]  Physical Therapy Treatment  Patient Details  Name: Whitney [name] MRN: [id number] Date of Birth: [birth year] Referring Provider (PT): Kurtis Bushman, MD.   Encounter Date: 05/06/2021   PT End of Session - 05/06/21 1416     Visit Number 9    Number of Visits 24    Date for PT Re-Evaluation 06/11/21    Authorization Type [employer] MEDICARE (Referring periord from 03/19/2021)    Progress Note Due on Visit 10    PT Start Time 1300    PT Stop Time 1345    PT Time Calculation (min) 45 min    Equipment Utilized During Treatment Gait belt;Other (comment)   RW, SPC   Activity Tolerance Patient tolerated treatment well    Behavior During Therapy WFL for tasks assessed/performed             Past Medical History:  Diagnosis Date   Abnormal Pap smear of cervix    Pt states she had colposcopy    Anxiety    Aortic atherosclerosis (HCC)    Arthritis    CAD (coronary artery disease)    Chronic kidney disease    s/p LEFT nephrectomy   Chronic lower back pain    COVID    Depression    Diverticulitis    Dyspnea    History of methicillin resistant staphylococcus aureus (MRSA) 12/21/2020   nasal swab pcr   Hx of unilateral nephrectomy 1979   Left Nephrectomy   Hypertension    Lower extremity edema    PAF (paroxysmal atrial fibrillation) (HCC)    Palpitations    PMB (postmenopausal bleeding)    Restless leg syndrome    Sleep apnea    has no cpap   Symptomatic bradycardia    Vitamin D deficiency     Past Surgical History:  Procedure Laterality Date   BIOPSY  10/31/2019   Procedure: BIOPSY;  Surgeon: Irving Copas., MD;  Location: [facility];  Service: Gastroenterology;;   CARDIAC CATHETERIZATION     CHOLECYSTECTOMY     COLONOSCOPY WITH PROPOFOL N/A 06/13/2019   Procedure: COLONOSCOPY WITH PROPOFOL;  Surgeon: [name], MD;  Location: [facility] ENDOSCOPY;  Service: Gastroenterology;  Laterality: N/A;   COLONOSCOPY WITH PROPOFOL N/A 08/12/2019   Procedure: COLONOSCOPY WITH PROPOFOL;  Surgeon: [name], MD;  Location: [facility] ENDOSCOPY;  Service: Gastroenterology;  Laterality: N/A;   COLONOSCOPY WITH PROPOFOL N/A 09/19/2019   Procedure: COLONOSCOPY WITH PROPOFOL;  Surgeon: [name], MD;  Location: [facility] ENDOSCOPY;  Service: Gastroenterology;  Laterality: N/A;   DILATION AND CURETTAGE OF UTERUS     ESOPHAGOGASTRODUODENOSCOPY (EGD) WITH PROPOFOL N/A 06/13/2019   Procedure: ESOPHAGOGASTRODUODENOSCOPY (EGD) WITH PROPOFOL;  Surgeon: [name], MD;  Location: [facility] ENDOSCOPY;  Service: Gastroenterology;  Laterality: N/A;  Pt will go for COVID test on 56-8-61 as she uses public transportation and will be in the area on that day.    ESOPHAGOGASTRODUODENOSCOPY (EGD) WITH PROPOFOL N/A 10/31/2019   Procedure: ESOPHAGOGASTRODUODENOSCOPY (EGD) WITH PROPOFOL;  Surgeon: Rush Landmark Telford Nab., MD;  Location: [place];  Service: Gastroenterology;  Laterality: N/A;   HEMOSTASIS CLIP PLACEMENT  10/31/2019   Procedure: HEMOSTASIS CLIP PLACEMENT;  Surgeon: Irving Copas., MD;  Location: [place];  Service: Gastroenterology;;   HERNIA REPAIR     HYSTEROSCOPY WITH D & C N/A 02/16/2017   Procedure: DILATATION AND CURETTAGE /HYSTEROSCOPY;  Surgeon: Brayton Mars, MD;  Location: [facility];  Service: Gynecology;  Laterality: N/A;   JOINT REPLACEMENT Left    knee   kidney removal Left    KIDNEY SURGERY Left 1979   left kidney removed   LEFT HEART CATH AND CORONARY ANGIOGRAPHY N/A 10/21/2017   Procedure: LEFT HEART CATH AND CORONARY ANGIOGRAPHY;  Surgeon: Teodoro Spray, MD;  Location: [facility];  Service: Cardiovascular;  Laterality: N/A;   POLYPECTOMY  10/31/2019   Procedure: POLYPECTOMY;  Surgeon: Rush Landmark Telford Nab., MD;  Location: [place];  Service: Gastroenterology;;   TOTAL KNEE  ARTHROPLASTY Left 07/06/2017   Procedure: TOTAL KNEE ARTHROPLASTY;  Surgeon: Lovell Sheehan, MD;  Location: [facility];  Service: Orthopedics;  Laterality: Left;   TOTAL KNEE ARTHROPLASTY Right 02/25/2021   Procedure: TOTAL KNEE ARTHROPLASTY;  Surgeon: Lovell Sheehan, MD;  Location: [facility];  Service: Orthopedics;  Laterality: Right;   UPPER ESOPHAGEAL ENDOSCOPIC ULTRASOUND (EUS) N/A 10/31/2019   Procedure: UPPER ESOPHAGEAL ENDOSCOPIC ULTRASOUND (EUS);  Surgeon: Irving Copas., MD;  Location: [place];  Service: Gastroenterology;  Laterality: N/A;   WRIST ARTHROSCOPY Left 1970s   XI ROBOTIC ASSISTED VENTRAL HERNIA N/A 12/01/2019   Procedure: XI ROBOTIC ASSISTED VENTRAL HERNIA;  Surgeon: Jules Husbands, MD;  Location: [facility];  Service: General;  Laterality: N/A;    There were no vitals filed for this visit.   Subjective Assessment - 05/06/21 1303     Subjective Patient reports she has no pain but she has been having some feeling that she describes as a spasm on the right quad that was related to R knee buckling and she felt she would have fallen with her cane. She missed her last PT appt because B LE swollen on Wednesday. She has no pain currently but arrives on RW.    Pertinent History Patient is a 70 year old female who presents to outpatient physical therapy with a referral s/p R TKA (02/25/2021). This patient's chief complaints consist of R knee and leg pain and limited ROM, leading to the following functional deficits: difficulty with walking, standing, standing from sitting, going up/down stairs, bed mobility, getting in the shower/tub, getting out of performing ADLs such as sweeping, cooking, cleaning, doing dishes. Relevant past medical history and comorbidities include: asthma, sleep apnea, HTN, CAD, Afib, Anxiety, depression, GERD, renal insufficiency, arthritis, and CKD, L TKA, obesity. Patient denies, history of cancer, stroke, seizure, major lung or heart problems, unexplained  weight loss, numbness in groin, bowel or bladder changes.    Limitations Standing;Walking;House hold activities;Other (comment);Lifting;Sitting   Difficulty with walking, standing, standing from sitting, going up/down stairs, bed mobility, getting in the shower/tub, getting out of performing ADLs such as sweeping, cooking, cleaning, doing dishes   How long can you walk comfortably? 2 minutes    Patient Stated Goals "walk without a walker and without a cane"    Currently in Pain? No/denies    Pain Onset Other (comment)   Surgical pain onset 02/25/2021. Radiating R leg pain occured prior to surgery            OBJECTIVE  VITALS All BP taken in seated position at left wrist passively supported on table at heart level.  At rest prior to exercise:  BP 147/84 mmHg, HR 83 bpm. automatic small cuff.  Following 6MWT (stopped due to lightheaded and  "erie" feeling and staggering): BP 172/90 mmHg,  HR 79 bpm. SpO2 97%. Automatic small cuff.  BP 166/85 mmHg,  HR 73 bpm. Automatic small cuff.  BP 168/98 mmHg, manual standard size cuff.  Following OMEGA leg extension (pt felt lightheaded when she stood up and reported feeling of heart palpitations): Seated (on leg extension machine) manual HR 88 bpm while pt states she feels heart palpitations. Steady pulse palpated by PT.  Prior to leaving clinic (after a few min seated rest following attempt to get up from OMEGA leg extension machine).  BP 160/92 mmHg, HR 75 BPM, SpO2 96%, manual standard size cuff. (Steady pulse noted).      TREATMENT:  Therapeutic exercise: to centralize symptoms and improve ROM, strength, muscular endurance, and activity tolerance required for successful completion of functional activities.  - vitals to assess safety prior to exercise (see above).  - ambulation overground around room with SPC and CGA for safety. 664 feet before needing to sit after feeling a little light headed and "erie" (as described by patient).  Patient  staggering to left at times and stumbling. Vitals taken directly after and found to have elevated BP compared to prior to exercise - see above. Felt better after several minutes of rest while getting vitals. Completed another 150 feet of ambulation with SPC and CGA for safety.  - ambulated ~ 25 feet from chair to [place] with SPC-no AD and SBA (pt handed PT the cane and said she didn't need it).  - OMEGA leg extension machine, B LE, 1x10 at 20#, 2x25#. Cued for 1 second hold at top of extension.  - OMEGA leg extension R LE only, 1x3 with 10#, 2x10 with 5#. (Very difficult for pt to extend R knee in isolation).  - Attempted to stand and walk back to chair (~25 feet) with no AD but immediately felt lightheaded and needed min A and strong cuing from PT to return 2 steps to OMEGA machine to sit down - HR palpated (pt reports feeling chest palpations) and unable to detect arrhythmia (see vitals above).  - sit <> stand transfer with CGA from [place] to chair right next to machine without incident.  - Final vitals measurement seated in chair after further rest (see above).  - ambulation ~ 75 feet from clinic to waiting room to bench outside door with RW and close supervision. No wobbliness noted, pt denies lightheadedness and insists she is okay. Declines offers of further assistance.    Pt required multimodal cuing for proper technique and to facilitate improved neuromuscular control, strength, range of motion, and functional ability resulting in improved performance and form.      HOME EXERCISE PLAN Access Code: [email or web address] URL: [email or web address] Date: 04/29/2021 Prepared by: Rosita Kea   Exercises Sit to Stand - 3 sets - 10 reps Seated Long Arc Quad - 3 sets - 20 reps - 5 hold Seated Knee Flexion Stretch - 3 sets - 30 seconds. hold Standing Terminal Knee Extension with Resistance - 1-2 x daily - 20 reps - 5 seconds hold    PT Education - 05/06/21 1415     Education Details  exercise purpose/form. Importance of BP control, precautions for leaving clinic after being limited in exertion in clinic.    Person(s) Educated Patient    Methods Explanation;Demonstration;Tactile cues;Verbal cues    Comprehension Verbalized understanding;Returned demonstration;Verbal cues required;Tactile cues required;Need further instruction              PT Short Term Goals - 04/10/21 1447       PT SHORT TERM GOAL #1   Title Patient will be independent with  inital home exercise program to improve strength/mobility and functional independence with ADLs.    Baseline Initial HEP provided at eval - more formal HEP to be provided at second visit.    Time 3    Period Weeks    Status Achieved    Target Date 04/09/21               PT Long Term Goals - 04/24/21 1628       PT LONG TERM GOAL #1   Title Patient will be independent with long term home exercise program to improve strength/mobility and functional independence with ADLs.    Baseline Initial HEP provided at eval - more formal HEP to be provided at second visit (03/19/2021); currently participating in appropriate HEP (04/24/2021);   TARGET DATE FOR ALL LONG TERM GOALS: 06/11/2021   Time 12    Period Weeks    Status Partially Met   TARGET DATE FOR ALL LONG TERM GOALS: 06/11/2021     PT LONG TERM GOAL #2   Title Demonstrate improved FOTO score by 10 units or greater to demonstrate improvement in overall condition and self-reported functional ability.    Baseline 58 (03/19/2021)    Time 12    Period Weeks    Status On-going      PT LONG TERM GOAL #3   Title Pt will increase R knee extension/flexion strength to at least a 5/5 MMT grade without pain in order to demonstrate improvement in strength and function    Baseline 3+ strength limited by pain.    Status On-going      PT LONG TERM GOAL #4   Title Patient will ambulate equal or greater than 1000 feet on 6 Minute Walk Test with no AD to demonstrate improved community  and household mobility.    Baseline Patient unable to walk greater than 2 minutes (03/19/2021); ambulated 1000 feet with RW and CGA. L leg buckled one time per patient report. (04/24/2021)    Time 12    Period Weeks    Status Partially Met      PT LONG TERM GOAL #5   Title Patient will improve R knee flexion to 120 degrees or greater to demonstrate improvements in mobility and community ambulation without compensation.    Baseline 93 degrees (03/19/2021)    Time 12    Period Weeks    Status On-going      PT LONG TERM GOAL #6   Title Patient will improve R knee extension to 0 degrees or greater to demonstrate improvements in mobility and community ambulation without compensation.    Baseline -11 degrees (03/19/2021)    Time 12    Period Weeks    Status On-going      PT LONG TERM GOAL #7   Title Patient will reduce 5 Time Sit To Stand to <15 seconds to reduce fall risk and demonstrate improved transfer/gait ability.    Baseline 16 seconds without UE support (04/24/2021).    Time 12    Period Weeks    Status Partially Met                   Plan - 05/06/21 1415     Clinical Impression Statement Patient's session was limited today by imbalance, lightheadedness, and sensation of heart palpitations. Patient tends to have high blood pressure but initial reading today appeared better than usual. Patient observed to have unsteadiness with ambulation task and needed to sit about 5 min intou 6 min walk  test due to feeling unsteady and lightheadedness. BP significantly elevated at this time but pt felt better. Exercise plan modified to work more isolated muscle groups to decrease effect on blood pressure. Pateint again felt lightheaded and unsteady after leg press exercise. She reported palpitations that she states she sometimes gets and has a PRN medication for them she plans to take when she gets home. Patient felt better after receiving water and rest. Vitals returned to more normal range  for patient and she was able to walk out to the bench in front of the clinic to await her ride without evidence of unsteadiness or complaint of lightheadedness. She did report slight headache by end of session that was not present initially. Patient refused further assistance after leaving the clinic and insisted she felt okay. She said he was calling her niece to come stay with her at home so she would not be alone. When patient left clinic she was walking safely with cognition at baseline with her RW and not complaining of lightheadedness or palpitations. Her last vitals reading was [facility] for her. Pt did demo extreme difficulty with isolated R knee extension strength, which is likely contributing to her knee buckling and requires further attention at next session as able. Patient would benefit from continued management of limiting condition by skilled physical therapist to address remaining impairments and functional limitations to work towards stated goals and return to PLOF or maximal functional independence.    Personal Factors and Comorbidities Age;Comorbidity 3+;Past/Current Experience    Comorbidities asthma, sleep apnea, HTN, CAD, Afib, Anxiety, depression, GERD, renal insufficiency, arthritis, and CKD, L TKA, obesity    Examination-Activity Limitations Bathing;Sit;Stand;Bed Mobility;Locomotion Level;Bend;Dressing;Squat;Transfers;Hygiene/Grooming;Stairs    Examination-Participation Restrictions Cleaning;Yard Work;Community Activity;Laundry    Stability/Clinical Decision Making Stable/Uncomplicated    Rehab Potential Good    PT Frequency 2x / week    PT Duration 12 weeks    PT Treatment/Interventions ADLs/Self Care Home Management;Cryotherapy;Electrical Stimulation;Moist Heat;Gait training;Functional mobility training;Stair training;Balance training;Therapeutic exercise;Therapeutic activities;Neuromuscular re-education;Patient/family education;Manual techniques;Scar mobilization;Passive range of  motion;Joint Manipulations;Dry needling    PT Next Visit Plan Progress LE strength and mobility as tolerated.    PT Home Exercise Plan Medbridge Access Code: [email or web address]    Consulted and Agree with Plan of Care Patient             Patient will benefit from skilled therapeutic intervention in order to improve the following deficits and impairments:  Abnormal gait, Decreased activity tolerance, Decreased endurance, Decreased range of motion, Decreased skin integrity, Decreased strength, Hypomobility, Impaired perceived functional ability, Impaired sensation, Improper body mechanics, Pain, Decreased balance, Decreased mobility, Difficulty walking, Increased edema, Impaired flexibility, Obesity  Visit Diagnosis: Acute pain of right knee  Muscle weakness (generalized)  Stiffness of right knee, not elsewhere classified  Difficulty in walking, not elsewhere classified     Problem List Patient Active Problem List   Diagnosis Date Noted   History of total knee arthroplasty, right 02/25/2021   Intestinal metaplasia of gastric cardia 11/07/2020   Mild intermittent asthma without complication 69/62/9528   Shortness of breath 08/08/2020   Vaginal candidiasis 08/08/2020   Atherosclerosis of aorta ([place]) 07/09/2020   Acute diverticulitis 05/13/2020   Fever blister 10/15/2019   Cerumen in auditory canal on examination 10/15/2019   Bilateral hearing loss 10/15/2019   Need for vaccination against Streptococcus pneumoniae using pneumococcal conjugate vaccine 7 10/15/2019   Encounter for screening mammogram for malignant neoplasm of breast 10/15/2019   Colon cancer screening 10/15/2019   Weakness 09/02/2019  Headache disorder 09/02/2019   Stage 3b chronic kidney disease ([place]) 08/10/2019   Elevated erythrocyte sedimentation rate 11/02/2018   Lumbar spondylosis 11/02/2018   PVC's (premature ventricular contractions) 10/18/2018   Primary osteoarthritis of right knee 09/15/2018   Encounter for  pre-operative examination 09/15/2018   Obstructive sleep apnea 09/15/2018   Calculus of gallbladder without cholecystitis without obstruction 09/15/2018   Calculus of gallbladder with cholecystitis without biliary obstruction 01/27/2018   Restless leg syndrome 01/27/2018   Generalized abdominal pain 01/11/2018   Cyst of pancreas 01/11/2018   Other specified disorders of kidney and ureter 01/11/2018   Primary insomnia 01/11/2018   Paroxysmal atrial fibrillation ([place]) 11/12/2017   Essential hypertension 10/25/2017   Mixed hyperlipidemia 10/25/2017   Low back pain at multiple sites 10/25/2017   Dysuria 10/25/2017   Encounter for general adult medical examination with abnormal findings 10/25/2017   Vitamin D deficiency 10/25/2017   Near syncope    Symptomatic bradycardia 10/19/2017   Carpal tunnel syndrome 10/12/2017   Primary osteoarthritis of left knee 07/09/2017   History of total knee arthroplasty 07/06/2017   Chest pain 06/29/2017   Postop check 02/25/2017   Impingement syndrome of shoulder region 01/20/2017   Neck pain 01/20/2017   Obesity (BMI 35.0-39.9 without comorbidity) 07/15/2016   Endometrial polyp 07/15/2016   Asymptomatic postmenopausal state 07/15/2016   Morbid obesity ([employer]) 07/15/2016   Family history of breast cancer in first degree relative 07/15/2016   Family history of ovarian cancer 07/15/2016   Knee pain 07/04/2016   Bilateral leg pain 06/24/2016    Everlean Alstrom. Graylon Good, PT, DPT 05/06/21, 2:17 PM   [facility] [address], [place], [address] Phone: [phone]   Fax:  [phone]  Name: Whitney [name] MRN: [id number] Date of Birth: 1950/10/05

## 2021-05-08 ENCOUNTER — Other Ambulatory Visit: Payer: Self-pay | Admitting: Internal Medicine

## 2021-05-08 ENCOUNTER — Ambulatory Visit: Payer: Medicare Other | Admitting: Physical Therapy

## 2021-05-08 ENCOUNTER — Encounter: Payer: Self-pay | Admitting: Physical Therapy

## 2021-05-08 DIAGNOSIS — M6281 Muscle weakness (generalized): Secondary | ICD-10-CM

## 2021-05-08 DIAGNOSIS — M25561 Pain in right knee: Secondary | ICD-10-CM

## 2021-05-08 DIAGNOSIS — R262 Difficulty in walking, not elsewhere classified: Secondary | ICD-10-CM

## 2021-05-08 DIAGNOSIS — M25661 Stiffness of right knee, not elsewhere classified: Secondary | ICD-10-CM

## 2021-05-08 DIAGNOSIS — I10 Essential (primary) hypertension: Secondary | ICD-10-CM

## 2021-05-08 NOTE — Therapy (Signed)
[facility] [address], [place], [address] Phone: [phone]   Fax:  [phone]  Physical Therapy Treatment / Progress Note Dates of reporting from 03/19/2021 - 05/08/2021  Patient Details  Name: Whitney [name] MRN: [id number] Date of Birth: 1951/06/27 Referring Provider (PT): Kurtis Bushman, MD.   Encounter Date: 05/08/2021   PT End of Session - 05/08/21 1315     Visit Number 10    Number of Visits 24    Date for PT Re-Evaluation 06/11/21    Authorization Type [employer] MEDICARE (Referring periord from 03/19/2021)    Progress Note Due on Visit 10    PT Start Time 1303    PT Stop Time 1343    PT Time Calculation (min) 40 min    Equipment Utilized During Treatment Gait belt;Other (comment)   RW   Activity Tolerance Patient tolerated treatment well    Behavior During Therapy WFL for tasks assessed/performed             Past Medical History:  Diagnosis Date   Abnormal Pap smear of cervix    Pt states she had colposcopy    Anxiety    Aortic atherosclerosis (HCC)    Arthritis    CAD (coronary artery disease)    Chronic kidney disease    s/p LEFT nephrectomy   Chronic lower back pain    COVID    Depression    Diverticulitis    Dyspnea    History of methicillin resistant staphylococcus aureus (MRSA) 12/21/2020   nasal swab pcr   Hx of unilateral nephrectomy 1979   Left Nephrectomy   Hypertension    Lower extremity edema    PAF (paroxysmal atrial fibrillation) (HCC)    Palpitations    PMB (postmenopausal bleeding)    Restless leg syndrome    Sleep apnea    has no cpap   Symptomatic bradycardia    Vitamin D deficiency     Past Surgical History:  Procedure Laterality Date   BIOPSY  10/31/2019   Procedure: BIOPSY;  Surgeon: Irving Copas., MD;  Location: [place];  Service: Gastroenterology;;   CARDIAC CATHETERIZATION     CHOLECYSTECTOMY     COLONOSCOPY WITH PROPOFOL N/A  06/13/2019   Procedure: COLONOSCOPY WITH PROPOFOL;  Surgeon: [name], MD;  Location: [facility];  Service: Gastroenterology;  Laterality: N/A;   COLONOSCOPY WITH PROPOFOL N/A 08/12/2019   Procedure: COLONOSCOPY WITH PROPOFOL;  Surgeon: [name], MD;  Location: [facility] ENDOSCOPY;  Service: Gastroenterology;  Laterality: N/A;   COLONOSCOPY WITH PROPOFOL N/A 09/19/2019   Procedure: COLONOSCOPY WITH PROPOFOL;  Surgeon: [name], MD;  Location: [facility] ENDOSCOPY;  Service: Gastroenterology;  Laterality: N/A;   DILATION AND CURETTAGE OF UTERUS     ESOPHAGOGASTRODUODENOSCOPY (EGD) WITH PROPOFOL N/A 06/13/2019   Procedure: ESOPHAGOGASTRODUODENOSCOPY (EGD) WITH PROPOFOL;  Surgeon: [name], MD;  Location: [facility] ENDOSCOPY;  Service: Gastroenterology;  Laterality: N/A;  Pt will go for COVID test on 21-0-31 as she uses public transportation and will be in the area on that day.    ESOPHAGOGASTRODUODENOSCOPY (EGD) WITH PROPOFOL N/A 10/31/2019   Procedure: ESOPHAGOGASTRODUODENOSCOPY (EGD) WITH PROPOFOL;  Surgeon: Rush Landmark Telford Nab., MD;  Location: [place];  Service: Gastroenterology;  Laterality: N/A;   HEMOSTASIS CLIP PLACEMENT  10/31/2019   Procedure: HEMOSTASIS CLIP PLACEMENT;  Surgeon: [place] Copas., MD;  Location: [place];  Service: Gastroenterology;;   HERNIA REPAIR     HYSTEROSCOPY WITH D & C N/A  02/16/2017   Procedure: DILATATION AND CURETTAGE /HYSTEROSCOPY;  Surgeon: Defrancesco, Alanda Slim, MD;  Location: [facility];  Service: Gynecology;  Laterality: N/A;   JOINT REPLACEMENT Left    knee   kidney removal Left    KIDNEY SURGERY Left 1979   left kidney removed   LEFT HEART CATH AND CORONARY ANGIOGRAPHY N/A 10/21/2017   Procedure: LEFT HEART CATH AND CORONARY ANGIOGRAPHY;  Surgeon: Teodoro Spray, MD;  Location: [facility];  Service: Cardiovascular;  Laterality: N/A;   POLYPECTOMY  10/31/2019   Procedure: POLYPECTOMY;  Surgeon: Rush Landmark Telford Nab., MD;  Location:  [place];  Service: Gastroenterology;;   TOTAL KNEE ARTHROPLASTY Left 07/06/2017   Procedure: TOTAL KNEE ARTHROPLASTY;  Surgeon: Lovell Sheehan, MD;  Location: [facility];  Service: Orthopedics;  Laterality: Left;   TOTAL KNEE ARTHROPLASTY Right 02/25/2021   Procedure: TOTAL KNEE ARTHROPLASTY;  Surgeon: Lovell Sheehan, MD;  Location: [facility];  Service: Orthopedics;  Laterality: Right;   UPPER ESOPHAGEAL ENDOSCOPIC ULTRASOUND (EUS) N/A 10/31/2019   Procedure: UPPER ESOPHAGEAL ENDOSCOPIC ULTRASOUND (EUS);  Surgeon: [place] Copas., MD;  Location: [place];  Service: Gastroenterology;  Laterality: N/A;   WRIST ARTHROSCOPY Left 1970s   XI ROBOTIC ASSISTED VENTRAL HERNIA N/A 12/01/2019   Procedure: XI ROBOTIC ASSISTED VENTRAL HERNIA;  Surgeon: Jules Husbands, MD;  Location: [facility];  Service: General;  Laterality: N/A;    There were no vitals filed for this visit.   Subjective Assessment - 05/08/21 1305     Subjective Patient reports she is feeling well today. States she has no pain upon arrival but had some soreness in B anterior thighs after last PT session. State she called Dr. Ubaldo Glassing about the palpitations she had last visit and he reccomended she take [facility]) which she took this morning. She has not had any more palpitations since last session. Arrives today with [facility] and states she decided to walk with it despite feeling some bucking on R LE. Pt reports HEP is going well.    Pertinent History Patient is a 70 year old female who presents to outpatient physical therapy with a referral s/p R TKA (02/25/2021). This patient's chief complaints consist of R knee and leg pain and limited ROM, leading to the following functional deficits: difficulty with walking, standing, standing from sitting, going up/down stairs, bed mobility, getting in the shower/tub, getting out of performing ADLs such as sweeping, cooking, cleaning, doing dishes. Relevant past medical history and  comorbidities include: asthma, sleep apnea, HTN, CAD, Afib, Anxiety, depression, GERD, renal insufficiency, arthritis, and CKD, L TKA, obesity. Patient denies, history of cancer, stroke, seizure, major lung or heart problems, unexplained weight loss, numbness in groin, bowel or bladder changes.    Limitations Standing;Walking;House hold activities;Other (comment);Lifting;Sitting   Difficulty with walking, standing, standing from sitting, going up/down stairs, bed mobility, getting in the shower/tub, getting out of performing ADLs such as sweeping, cooking, cleaning, doing dishes   How long can you walk comfortably? 2 minutes    Patient Stated Goals "walk without a walker and without a cane"    Currently in Pain? No/denies    Pain Onset Other (comment)   Surgical pain onset 02/25/2021. Radiating R leg pain occured prior to surgery   Effect of Pain on Daily Activities Everything has gotten better but still has some trouble with walking, standing, going up/down stairs, sweeping, cooking, sweeping, doing dishes.   standing up from sitting, bed mobility, getting in the shower/tub, ADLs such  as dressing is better now            OBJECTIVE   VITALS All BP taken in seated position at left wrist passively supported on table at heart level.  At rest prior to exercise:  BP 150/96 mmHg, HR 69 bpm. Manual adult cuff. SpO2 98%.    SELF-REPORTED FUNCTION FOTO score: 57/100 (knee questionnaire)    LE PERIPHERAL JOINT MOTION (PROM in degrees)   03/19/2021 05/08/21 Date  Joint/Motion R/L R/L R/L  Knee        Flexion 93*/   112*/112*    Extension - 11*/   +5/0    *Indicates pain Comments: empty end feel noted with R knee flexion. Patient reports primary pain through R anterior leg and hip and mild pain on the front of her knee.      LE MUSCLE PERFORMANCE (MMT): *Indicates pain 03/19/21 05/08/21 Date  Joint/Motion R/L R/L R/L  Hip        Flexion 4/4      Knee        Extension 3+*/ 4+/5     Flexion  3+*/  4*/4     *denotes pain.    FUNCTIONAL TESTS 6 Minute Walk Test: 5035 feet with SPC and supervision for safety.  5 Time Sit To Stand test: 11.5 seconds from 18.5 inch plinth with no UE support.    TREATMENT:  Therapeutic exercise: to centralize symptoms and improve ROM, strength, muscular endurance, and activity tolerance required for successful completion of functional activities.  - vitals to assess safety prior to exercise (see above).  - Objective measurements completed on examination: See above findings. - OMEGA leg extension machine, B LE, 2x10 at 25#,   Pt required multimodal cuing for proper technique and to facilitate improved neuromuscular control, strength, range of motion, and functional ability resulting in improved performance and form.      HOME EXERCISE PLAN Access Code: [email or web address] URL: [email or web address] Date: 04/29/2021 Prepared by: Rosita Kea   Exercises Sit to Stand - 3 sets - 10 reps Seated Long Arc Quad - 3 sets - 20 reps - 5 hold Seated Knee Flexion Stretch - 3 sets - 30 seconds. hold Standing Terminal Knee Extension with Resistance - 1-2 x daily - 20 reps - 5 seconds hold     PT Education - 05/08/21 1314     Education Details exercise purpose/form.    Person(s) Educated Patient    Methods Explanation;Demonstration;Tactile cues;Verbal cues    Comprehension Verbalized understanding;Returned demonstration;Verbal cues required;Tactile cues required;Need further instruction              PT Short Term Goals - 04/10/21 1447       PT SHORT TERM GOAL #1   Title Patient will be independent with inital home exercise program to improve strength/mobility and functional independence with ADLs.    Baseline Initial HEP provided at eval - more formal HEP to be provided at second visit.    Time 3    Period Weeks    Status Achieved    Target Date 04/09/21               PT Long Term Goals - 05/08/21 1332       PT LONG TERM GOAL  #1   Title Patient will be independent with long term home exercise program to improve strength/mobility and functional independence with ADLs.    Baseline Initial HEP provided at eval - more formal HEP to be provided at second visit (03/19/2021);  currently participating in appropriate HEP (04/24/2021);   TARGET DATE FOR ALL LONG TERM GOALS: 06/11/2021   Time 12    Period Weeks    Status Partially Met   TARGET DATE FOR ALL LONG TERM GOALS: 06/11/2021     PT LONG TERM GOAL #2   Title Demonstrate improved FOTO score by 10 units or greater to demonstrate improvement in overall condition and self-reported functional ability.    Baseline 58 (03/19/2021); 57 (05/08/2021);    Time 12    Period Weeks    Status On-going      PT LONG TERM GOAL #3   Title Pt will increase R knee extension/flexion strength to at least a 5/5 MMT grade without pain in order to demonstrate improvement in strength and function    Baseline 3+ strength limited by pain.    Status On-going      PT LONG TERM GOAL #4   Title Patient will ambulate equal or greater than 1000 feet on 6 Minute Walk Test with no AD to demonstrate improved community and household mobility.    Baseline Patient unable to walk greater than 2 minutes (03/19/2021); ambulated 1000 feet with RW and CGA. L leg buckled one time per patient report. (04/24/2021)    Time 12    Period Weeks    Status Partially Met      PT LONG TERM GOAL #5   Title Patient will improve R knee flexion to 120 degrees or greater to demonstrate improvements in mobility and community ambulation without compensation.    Baseline 93 degrees (03/19/2021)    Time 12    Period Weeks    Status On-going      PT LONG TERM GOAL #6   Title Patient will improve R knee extension to 0 degrees or greater to demonstrate improvements in mobility and community ambulation without compensation.    Baseline -11 degrees (03/19/2021)    Time 12    Period Weeks    Status On-going      PT LONG TERM GOAL #7    Title Patient will reduce 5 Time Sit To Stand to <15 seconds to reduce fall risk and demonstrate improved transfer/gait ability.    Baseline 16 seconds without UE support (04/24/2021).    Time 12    Period Weeks    Status Partially Met                   Plan - 05/09/21 1527     Clinical Impression Statement Patient has attended 10 physical therapy sessions this episode of care. The frequency of her visits has been interrupted by medical problems and excessive pain in the right LE. She has made good progress towards all goals but continues to have difficulty with safe ambulation due to R knee buckling. She also does not tolerate passive stretching of the R knee into flexion due to excessive pain in R lower leg. Patient would benefit from continued management of limiting condition by skilled physical therapist to address remaining impairments and functional limitations to work towards stated goals and return to PLOF or maximal functional independence.    Personal Factors and Comorbidities Age;Comorbidity 3+;Past/Current Experience    Comorbidities asthma, sleep apnea, HTN, CAD, Afib, Anxiety, depression, GERD, renal insufficiency, arthritis, and CKD, L TKA, obesity    Examination-Activity Limitations Bathing;Sit;Stand;Bed Mobility;Locomotion Level;Bend;Dressing;Squat;Transfers;Hygiene/Grooming;Stairs    Examination-Participation Restrictions Cleaning;Yard Work;Community Activity;Laundry    Stability/Clinical Decision Making Stable/Uncomplicated    Rehab Potential Good    PT Frequency 2x /  week    PT Duration 12 weeks    PT Treatment/Interventions ADLs/Self Care Home Management;Cryotherapy;Electrical Stimulation;Moist Heat;Gait training;Functional mobility training;Stair training;Balance training;Therapeutic exercise;Therapeutic activities;Neuromuscular re-education;Patient/family education;Manual techniques;Scar mobilization;Passive range of motion;Joint Manipulations;Dry needling    PT  Next Visit Plan Progress LE strength and mobility as tolerated.    PT Home Exercise Plan Medbridge Access Code: [email or web address]    Consulted and Agree with Plan of Care Patient             Patient will benefit from skilled therapeutic intervention in order to improve the following deficits and impairments:  Abnormal gait, Decreased activity tolerance, Decreased endurance, Decreased range of motion, Decreased skin integrity, Decreased strength, Hypomobility, Impaired perceived functional ability, Impaired sensation, Improper body mechanics, Pain, Decreased balance, Decreased mobility, Difficulty walking, Increased edema, Impaired flexibility, Obesity  Visit Diagnosis: Acute pain of right knee  Muscle weakness (generalized)  Stiffness of right knee, not elsewhere classified  Difficulty in walking, not elsewhere classified     Problem List Patient Active Problem List   Diagnosis Date Noted   History of total knee arthroplasty, right 02/25/2021   Intestinal metaplasia of gastric cardia 11/07/2020   Mild intermittent asthma without complication 93/23/5573   Shortness of breath 08/08/2020   Vaginal candidiasis 08/08/2020   Atherosclerosis of aorta (HCC) 07/09/2020   Acute diverticulitis 05/13/2020   Fever blister 10/15/2019   Cerumen in auditory canal on examination 10/15/2019   Bilateral hearing loss 10/15/2019   Need for vaccination against Streptococcus pneumoniae using pneumococcal conjugate vaccine 7 10/15/2019   Encounter for screening mammogram for malignant neoplasm of breast 10/15/2019   Colon cancer screening 10/15/2019   Weakness 09/02/2019   Headache disorder 09/02/2019   Stage 3b chronic kidney disease ([place]) 08/10/2019   Elevated erythrocyte sedimentation rate 11/02/2018   Lumbar spondylosis 11/02/2018   PVC's (premature ventricular contractions) 10/18/2018   Primary osteoarthritis of right knee 09/15/2018   Encounter for pre-operative examination 09/15/2018    Obstructive sleep apnea 09/15/2018   Calculus of gallbladder without cholecystitis without obstruction 09/15/2018   Calculus of gallbladder with cholecystitis without biliary obstruction 01/27/2018   Restless leg syndrome 01/27/2018   Generalized abdominal pain 01/11/2018   Cyst of pancreas 01/11/2018   Other specified disorders of kidney and ureter 01/11/2018   Primary insomnia 01/11/2018   Paroxysmal atrial fibrillation ([place]) 11/12/2017   Essential hypertension 10/25/2017   Mixed hyperlipidemia 10/25/2017   Low back pain at multiple sites 10/25/2017   Dysuria 10/25/2017   Encounter for general adult medical examination with abnormal findings 10/25/2017   Vitamin D deficiency 10/25/2017   Near syncope    Symptomatic bradycardia 10/19/2017   Carpal tunnel syndrome 10/12/2017   Primary osteoarthritis of left knee 07/09/2017   History of total knee arthroplasty 07/06/2017   Chest pain 06/29/2017   Postop check 02/25/2017   Impingement syndrome of shoulder region 01/20/2017   Neck pain 01/20/2017   Obesity (BMI 35.0-39.9 without comorbidity) 07/15/2016   Endometrial polyp 07/15/2016   Asymptomatic postmenopausal state 07/15/2016   Morbid obesity ([place]) 07/15/2016   Family history of breast cancer in first degree relative 07/15/2016   Family history of ovarian cancer 07/15/2016   Knee pain 07/04/2016   Bilateral leg pain 06/24/2016    Everlean Alstrom. Graylon Good, PT, DPT 05/09/21, 3:28 PM   [facility] [address] [place], [address] Phone: [phone]   Fax:  [phone]  Name: Whitney [name] MRN: [id number] Date of Birth:  11/09/1950    

## 2021-05-13 ENCOUNTER — Ambulatory Visit: Payer: Medicare Other | Admitting: Physical Therapy

## 2021-05-15 ENCOUNTER — Ambulatory Visit: Payer: Medicare Other | Admitting: Physical Therapy

## 2021-05-15 DIAGNOSIS — R262 Difficulty in walking, not elsewhere classified: Secondary | ICD-10-CM | POA: Diagnosis not present

## 2021-05-15 DIAGNOSIS — M25561 Pain in right knee: Secondary | ICD-10-CM | POA: Diagnosis not present

## 2021-05-15 DIAGNOSIS — M25661 Stiffness of right knee, not elsewhere classified: Secondary | ICD-10-CM | POA: Diagnosis not present

## 2021-05-15 DIAGNOSIS — M6281 Muscle weakness (generalized): Secondary | ICD-10-CM | POA: Diagnosis not present

## 2021-05-15 NOTE — Therapy (Signed)
[facility] [address], [place], [address] Phone: [phone]   Fax:  [phone]  Physical Therapy Treatment  Patient Details  Name: Whitney [name] MRN: [id number] Date of Birth: [birth year] Referring Provider (PT): Kurtis Bushman, MD.   Encounter Date: 05/15/2021   PT End of Session - 05/15/21 1332     Visit Number 11    Number of Visits 24    Date for PT Re-Evaluation 06/11/21    Authorization Type [employer] MEDICARE (Referring periord from 03/19/2021)    Progress Note Due on Visit 10    PT Start Time 3810    PT Stop Time 1343    PT Time Calculation (min) 40 min    Equipment Utilized During Treatment Gait belt;Other (comment)   SPC   Activity Tolerance Patient tolerated treatment well    Behavior During Therapy WFL for tasks assessed/performed             Past Medical History:  Diagnosis Date   Abnormal Pap smear of cervix    Pt states she had colposcopy    Anxiety    Aortic atherosclerosis (HCC)    Arthritis    CAD (coronary artery disease)    Chronic kidney disease    s/p LEFT nephrectomy   Chronic lower back pain    COVID    Depression    Diverticulitis    Dyspnea    History of methicillin resistant staphylococcus aureus (MRSA) 12/21/2020   nasal swab pcr   Hx of unilateral nephrectomy 1979   Left Nephrectomy   Hypertension    Lower extremity edema    PAF (paroxysmal atrial fibrillation) (HCC)    Palpitations    PMB (postmenopausal bleeding)    Restless leg syndrome    Sleep apnea    has no cpap   Symptomatic bradycardia    Vitamin D deficiency     Past Surgical History:  Procedure Laterality Date   BIOPSY  10/31/2019   Procedure: BIOPSY;  Surgeon: Irving Copas., MD;  Location: [place];  Service: Gastroenterology;;   CARDIAC CATHETERIZATION     CHOLECYSTECTOMY     COLONOSCOPY WITH PROPOFOL N/A 06/13/2019   Procedure: COLONOSCOPY WITH PROPOFOL;  Surgeon: [name], MD;  Location: [facility]OSCOPY;  Service: Gastroenterology;  Laterality: N/A;   COLONOSCOPY WITH PROPOFOL N/A 08/12/2019   Procedure: COLONOSCOPY WITH PROPOFOL;  Surgeon: [name], MD;  Location: [facility] ENDOSCOPY;  Service: Gastroenterology;  Laterality: N/A;   COLONOSCOPY WITH PROPOFOL N/A 09/19/2019   Procedure: COLONOSCOPY WITH PROPOFOL;  Surgeon: [name], MD;  Location: [facility] ENDOSCOPY;  Service: Gastroenterology;  Laterality: N/A;   DILATION AND CURETTAGE OF UTERUS     ESOPHAGOGASTRODUODENOSCOPY (EGD) WITH PROPOFOL N/A 06/13/2019   Procedure: ESOPHAGOGASTRODUODENOSCOPY (EGD) WITH PROPOFOL;  Surgeon: [name], MD;  Location: [facility] ENDOSCOPY;  Service: Gastroenterology;  Laterality: N/A;  Pt will go for COVID test on 17-5-10 as she uses public transportation and will be in the area on that day.    ESOPHAGOGASTRODUODENOSCOPY (EGD) WITH PROPOFOL N/A 10/31/2019   Procedure: ESOPHAGOGASTRODUODENOSCOPY (EGD) WITH PROPOFOL;  Surgeon: Rush Landmark Telford Nab., MD;  Location: [place];  Service: Gastroenterology;  Laterality: N/A;   HEMOSTASIS CLIP PLACEMENT  10/31/2019   Procedure: HEMOSTASIS CLIP PLACEMENT;  Surgeon: Irving Copas., MD;  Location: [place];  Service: Gastroenterology;;   HERNIA REPAIR     HYSTEROSCOPY WITH D & C N/A 02/16/2017   Procedure: DILATATION AND CURETTAGE /HYSTEROSCOPY;  Surgeon:  Defrancesco, Alanda Slim, MD;  Location: [facility];  Service: Gynecology;  Laterality: N/A;   JOINT REPLACEMENT Left    knee   kidney removal Left    KIDNEY SURGERY Left 1979   left kidney removed   LEFT HEART CATH AND CORONARY ANGIOGRAPHY N/A 10/21/2017   Procedure: LEFT HEART CATH AND CORONARY ANGIOGRAPHY;  Surgeon: Teodoro Spray, MD;  Location: [facility];  Service: Cardiovascular;  Laterality: N/A;   POLYPECTOMY  10/31/2019   Procedure: POLYPECTOMY;  Surgeon: Rush Landmark Telford Nab., MD;  Location: [place];  Service: Gastroenterology;;   TOTAL KNEE  ARTHROPLASTY Left 07/06/2017   Procedure: TOTAL KNEE ARTHROPLASTY;  Surgeon: Lovell Sheehan, MD;  Location: [facility];  Service: Orthopedics;  Laterality: Left;   TOTAL KNEE ARTHROPLASTY Right 02/25/2021   Procedure: TOTAL KNEE ARTHROPLASTY;  Surgeon: Lovell Sheehan, MD;  Location: [facility];  Service: Orthopedics;  Laterality: Right;   UPPER ESOPHAGEAL ENDOSCOPIC ULTRASOUND (EUS) N/A 10/31/2019   Procedure: UPPER ESOPHAGEAL ENDOSCOPIC ULTRASOUND (EUS);  Surgeon: Irving Copas., MD;  Location: [place];  Service: Gastroenterology;  Laterality: N/A;   WRIST ARTHROSCOPY Left 1970s   XI ROBOTIC ASSISTED VENTRAL HERNIA N/A 12/01/2019   Procedure: XI ROBOTIC ASSISTED VENTRAL HERNIA;  Surgeon: Jules Husbands, MD;  Location: [facility];  Service: General;  Laterality: N/A;    There were no vitals filed for this visit.   Subjective Assessment - 05/15/21 1305     Subjective Patient reports her blood pressure has been high and she has an appointment with Nira Conn at [facility] on Friday to address this. She was told to increase her BP medication which she did yesterday. She missed her last PT appointment becuase she did not feel good and Diastolic BP was 161 mmHg. States it has been better this morning so she thought she could do PT. States she does have a slight headache. Her knee is not hurting. Her right ankle gave her some trouble this weekend but it is better now.    Pertinent History Patient is a 70 year old female who presents to outpatient physical therapy with a referral s/p R TKA (02/25/2021). This patient's chief complaints consist of R knee and leg pain and limited ROM, leading to the following functional deficits: difficulty with walking, standing, standing from sitting, going up/down stairs, bed mobility, getting in the shower/tub, getting out of performing ADLs such as sweeping, cooking, cleaning, doing dishes. Relevant past medical history and comorbidities include: asthma, sleep apnea,  HTN, CAD, Afib, Anxiety, depression, GERD, renal insufficiency, arthritis, and CKD, L TKA, obesity. Patient denies, history of cancer, stroke, seizure, major lung or heart problems, unexplained weight loss, numbness in groin, bowel or bladder changes.    Limitations Standing;Walking;House hold activities;Other (comment);Lifting;Sitting   Difficulty with walking, standing, standing from sitting, going up/down stairs, bed mobility, getting in the shower/tub, getting out of performing ADLs such as sweeping, cooking, cleaning, doing dishes   How long can you walk comfortably? 2 minutes    Patient Stated Goals "walk without a walker and without a cane"    Currently in Pain? No/denies    Pain Onset Other (comment)   Surgical pain onset 02/25/2021. Radiating R leg pain occured prior to surgery             OBJECTIVE   VITALS All BP taken in seated position at left wrist passively supported on table at heart level.  At rest prior to exercise:  BP 164/98 mmHg, HR 70  bpm. Manual adult cuff. SpO2 96%.  BP 160/88 mmHg, HR 69 bpm. Automatic adult cuff.       TREATMENT:  Therapeutic exercise: to centralize symptoms and improve ROM, strength, muscular endurance, and activity tolerance required for successful completion of functional activities.  - vitals to assess safety prior to exercise (see above).  Ambulation over ground outside over sloped and uneven ground: 1283 feet total. 90% with no AD and SBA - CGA and last 10%  with SPC and CGA. HR 105 bpm and SpO2 99% directly after (states she feels heart palpitations but is not worried about them and not staggering). (Ambulated around entire clinic building outside).  - OMEGA leg extension machine, 3x10 each side R = 12/11/08#, L = 20/15/15#.  (Pain at lateral thigh with left knee extension, decreases to baseline with rest). - sit <> stand with staggered stance (R LE back) to bias R LE for strengthening. 1x10 plus a few more reps to learn technique.  -  Education on HEP including handout     Pt required multimodal cuing for proper technique and to facilitate improved neuromuscular control, strength, range of motion, and functional ability resulting in improved performance and form.      HOME EXERCISE PLAN Access Code: [email or web address] URL: [email or web address] Date: 05/15/2021 Prepared by: Rosita Kea  Exercises Staggered Sit-to-Stand - 1 x daily - 3 sets - 10 reps Seated Long Arc Quad - 3 sets - 20 reps - 5 hold Seated Knee Flexion Stretch - 3 sets - 30 seconds. hold Standing Terminal Knee Extension with Resistance - 1-2 x daily - 20 reps - 5 seconds hold     PT Education - 05/15/21 1332     Education Details exercise purpose/form.    Person(s) Educated Patient    Methods Explanation;Demonstration;Tactile cues;Verbal cues    Comprehension Verbalized understanding;Returned demonstration;Verbal cues required;Tactile cues required;Need further instruction              PT Short Term Goals - 04/10/21 1447       PT SHORT TERM GOAL #1   Title Patient will be independent with inital home exercise program to improve strength/mobility and functional independence with ADLs.    Baseline Initial HEP provided at eval - more formal HEP to be provided at second visit.    Time 3    Period Weeks    Status Achieved    Target Date 04/09/21               PT Long Term Goals - 05/08/21 1332       PT LONG TERM GOAL #1   Title Patient will be independent with long term home exercise program to improve strength/mobility and functional independence with ADLs.    Baseline Initial HEP provided at eval - more formal HEP to be provided at second visit (03/19/2021); currently participating in appropriate HEP (04/24/2021);   TARGET DATE FOR ALL LONG TERM GOALS: 06/11/2021   Time 12    Period Weeks    Status Partially Met   TARGET DATE FOR ALL LONG TERM GOALS: 06/11/2021     PT LONG TERM GOAL #2   Title Demonstrate improved FOTO  score by 10 units or greater to demonstrate improvement in overall condition and self-reported functional ability.    Baseline 58 (03/19/2021); 57 (05/08/2021);    Time 12    Period Weeks    Status On-going      PT LONG TERM GOAL #3   Title Pt will increase R knee  extension/flexion strength to at least a 5/5 MMT grade without pain in order to demonstrate improvement in strength and function    Baseline 3+ strength limited by pain.    Status On-going      PT LONG TERM GOAL #4   Title Patient will ambulate equal or greater than 1000 feet on 6 Minute Walk Test with no AD to demonstrate improved community and household mobility.    Baseline Patient unable to walk greater than 2 minutes (03/19/2021); ambulated 1000 feet with RW and CGA. L leg buckled one time per patient report. (04/24/2021)    Time 12    Period Weeks    Status Partially Met      PT LONG TERM GOAL #5   Title Patient will improve R knee flexion to 120 degrees or greater to demonstrate improvements in mobility and community ambulation without compensation.    Baseline 93 degrees (03/19/2021)    Time 12    Period Weeks    Status On-going      PT LONG TERM GOAL #6   Title Patient will improve R knee extension to 0 degrees or greater to demonstrate improvements in mobility and community ambulation without compensation.    Baseline -11 degrees (03/19/2021)    Time 12    Period Weeks    Status On-going      PT LONG TERM GOAL #7   Title Patient will reduce 5 Time Sit To Stand to <15 seconds to reduce fall risk and demonstrate improved transfer/gait ability.    Baseline 16 seconds without UE support (04/24/2021).    Time 12    Period Weeks    Status Partially Met                   Plan - 05/15/21 1342     Clinical Impression Statement Patient tolerated treatment well overall with careful attention to her blood pressure and heart palpitations which has been limiting in the past. She did not demonstrate any buckling but  was limited in ambulation and left knee extension machine by left hip pain. Patient demo improved right knee extension strength and was able to complete OMEGA machine extension on the right side with 10# resistance. Patient continues to fatigue quickly and lacks strength in the right LE that limites her functional mobility. Patient would benefit from continued management of limiting condition by skilled physical therapist to address remaining impairments and functional limitations to work towards stated goals and return to PLOF or maximal functional independence.    Personal Factors and Comorbidities Age;Comorbidity 3+;Past/Current Experience    Comorbidities asthma, sleep apnea, HTN, CAD, Afib, Anxiety, depression, GERD, renal insufficiency, arthritis, and CKD, L TKA, obesity    Examination-Activity Limitations Bathing;Sit;Stand;Bed Mobility;Locomotion Level;Bend;Dressing;Squat;Transfers;Hygiene/Grooming;Stairs    Examination-Participation Restrictions Cleaning;Yard Work;Community Activity;Laundry    Stability/Clinical Decision Making Stable/Uncomplicated    Rehab Potential Good    PT Frequency 2x / week    PT Duration 12 weeks    PT Treatment/Interventions ADLs/Self Care Home Management;Cryotherapy;Electrical Stimulation;Moist Heat;Gait training;Functional mobility training;Stair training;Balance training;Therapeutic exercise;Therapeutic activities;Neuromuscular re-education;Patient/family education;Manual techniques;Scar mobilization;Passive range of motion;Joint Manipulations;Dry needling    PT Next Visit Plan Progress LE strength and mobility as tolerated.    PT Home Exercise Plan Medbridge Access Code: [email or web address]    Consulted and Agree with Plan of Care Patient             Patient will benefit from skilled therapeutic intervention in order to improve the following deficits and impairments:  Abnormal gait,  Decreased activity tolerance, Decreased endurance, Decreased range of motion, Decreased  skin integrity, Decreased strength, Hypomobility, Impaired perceived functional ability, Impaired sensation, Improper body mechanics, Pain, Decreased balance, Decreased mobility, Difficulty walking, Increased edema, Impaired flexibility, Obesity  Visit Diagnosis: Acute pain of right knee  Muscle weakness (generalized)  Stiffness of right knee, not elsewhere classified  Difficulty in walking, not elsewhere classified     Problem List Patient Active Problem List   Diagnosis Date Noted   History of total knee arthroplasty, right 02/25/2021   Intestinal metaplasia of gastric cardia 11/07/2020   Mild intermittent asthma without complication 95/28/4132   Shortness of breath 08/08/2020   Vaginal candidiasis 08/08/2020   Atherosclerosis of aorta (HCC) 07/09/2020   Acute diverticulitis 05/13/2020   Fever blister 10/15/2019   Cerumen in auditory canal on examination 10/15/2019   Bilateral hearing loss 10/15/2019   Need for vaccination against Streptococcus pneumoniae using pneumococcal conjugate vaccine 7 10/15/2019   Encounter for screening mammogram for malignant neoplasm of breast 10/15/2019   Colon cancer screening 10/15/2019   Weakness 09/02/2019   Headache disorder 09/02/2019   Stage 3b chronic kidney disease ([place]) 08/10/2019   Elevated erythrocyte sedimentation rate 11/02/2018   Lumbar spondylosis 11/02/2018   PVC's (premature ventricular contractions) 10/18/2018   Primary osteoarthritis of right knee 09/15/2018   Encounter for pre-operative examination 09/15/2018   Obstructive sleep apnea 09/15/2018   Calculus of gallbladder without cholecystitis without obstruction 09/15/2018   Calculus of gallbladder with cholecystitis without biliary obstruction 01/27/2018   Restless leg syndrome 01/27/2018   Generalized abdominal pain 01/11/2018   Cyst of pancreas 01/11/2018   Other specified disorders of kidney and ureter 01/11/2018   Primary insomnia 01/11/2018   Paroxysmal atrial  fibrillation ([place]) 11/12/2017   Essential hypertension 10/25/2017   Mixed hyperlipidemia 10/25/2017   Low back pain at multiple sites 10/25/2017   Dysuria 10/25/2017   Encounter for general adult medical examination with abnormal findings 10/25/2017   Vitamin D deficiency 10/25/2017   Near syncope    Symptomatic bradycardia 10/19/2017   Carpal tunnel syndrome 10/12/2017   Primary osteoarthritis of left knee 07/09/2017   History of total knee arthroplasty 07/06/2017   Chest pain 06/29/2017   Postop check 02/25/2017   Impingement syndrome of shoulder region 01/20/2017   Neck pain 01/20/2017   Obesity (BMI 35.0-39.9 without comorbidity) 07/15/2016   Endometrial polyp 07/15/2016   Asymptomatic postmenopausal state 07/15/2016   Morbid obesity ([place]) 07/15/2016   Family history of breast cancer in first degree relative 07/15/2016   Family history of ovarian cancer 07/15/2016   Knee pain 07/04/2016   Bilateral leg pain 06/24/2016    Everlean Alstrom. Graylon Good, PT, DPT 05/15/21, 2:00 PM  [facility] [address] [place], [address] Phone: [phone]   Fax:  [phone]  Name: Whitney [name] MRN: [id number] Date of Birth: 15-Sep-1950

## 2021-05-20 ENCOUNTER — Encounter: Payer: Self-pay | Admitting: Physical Therapy

## 2021-05-20 ENCOUNTER — Ambulatory Visit: Payer: Medicare Other | Admitting: Physical Therapy

## 2021-05-20 DIAGNOSIS — M25561 Pain in right knee: Secondary | ICD-10-CM

## 2021-05-20 DIAGNOSIS — M25661 Stiffness of right knee, not elsewhere classified: Secondary | ICD-10-CM

## 2021-05-20 DIAGNOSIS — R262 Difficulty in walking, not elsewhere classified: Secondary | ICD-10-CM

## 2021-05-20 DIAGNOSIS — M6281 Muscle weakness (generalized): Secondary | ICD-10-CM

## 2021-05-20 NOTE — Therapy (Addendum)
[facility] [address] [place], [address] Phone: [phone]   Fax:  [phone]  Physical Therapy Treatment  Patient Details  Name: Whitney [name] MRN: [id number] Date of Birth: [birth year] Referring Provider (PT): Kurtis Bushman, MD.   Encounter Date: 05/20/2021   PT End of Session - 05/20/21 1304     Visit Number 12    Number of Visits 24    Date for PT Re-Evaluation 06/11/21    Authorization Type [employer] MEDICARE (Referring periord from 03/19/2021)    Progress Note Due on Visit 10    PT Start Time 1301    PT Stop Time 1350    PT Time Calculation (min) 49 min    Equipment Utilized During Treatment Gait belt;Other (comment)   SPC   Activity Tolerance Patient tolerated treatment well    Behavior During Therapy WFL for tasks assessed/performed             Past Medical History:  Diagnosis Date   Abnormal Pap smear of cervix    Pt states she had colposcopy    Anxiety    Aortic atherosclerosis (HCC)    Arthritis    CAD (coronary artery disease)    Chronic kidney disease    s/p LEFT nephrectomy   Chronic lower back pain    COVID    Depression    Diverticulitis    Dyspnea    History of methicillin resistant staphylococcus aureus (MRSA) 12/21/2020   nasal swab pcr   Hx of unilateral nephrectomy 1979   Left Nephrectomy   Hypertension    Lower extremity edema    PAF (paroxysmal atrial fibrillation) (HCC)    Palpitations    PMB (postmenopausal bleeding)    Restless leg syndrome    Sleep apnea    has no cpap   Symptomatic bradycardia    Vitamin D deficiency     Past Surgical History:  Procedure Laterality Date   BIOPSY  10/31/2019   Procedure: BIOPSY;  Surgeon: Irving Copas., MD;  Location: [place];  Service: Gastroenterology;;   CARDIAC CATHETERIZATION     CHOLECYSTECTOMY     COLONOSCOPY WITH PROPOFOL N/A 06/13/2019   Procedure: COLONOSCOPY WITH PROPOFOL;  Surgeon: [name], MD;  Location: [facility] ENDOSCOPY;  Service: Gastroenterology;  Laterality: N/A;   COLONOSCOPY WITH PROPOFOL N/A 08/12/2019   Procedure: COLONOSCOPY WITH PROPOFOL;  Surgeon: [name], MD;  Location: [facility] ENDOSCOPY;  Service: Gastroenterology;  Laterality: N/A;   COLONOSCOPY WITH PROPOFOL N/A 09/19/2019   Procedure: COLONOSCOPY WITH PROPOFOL;  Surgeon: [name], MD;  Location: [facility] ENDOSCOPY;  Service: Gastroenterology;  Laterality: N/A;   DILATION AND CURETTAGE OF UTERUS     ESOPHAGOGASTRODUODENOSCOPY (EGD) WITH PROPOFOL N/A 06/13/2019   Procedure: ESOPHAGOGASTRODUODENOSCOPY (EGD) WITH PROPOFOL;  Surgeon: [name], MD;  Location: [facility] ENDOSCOPY;  Service: Gastroenterology;  Laterality: N/A;  Pt will go for COVID test on 19-7-58 as she uses public transportation and will be in the area on that day.    ESOPHAGOGASTRODUODENOSCOPY (EGD) WITH PROPOFOL N/A 10/31/2019   Procedure: ESOPHAGOGASTRODUODENOSCOPY (EGD) WITH PROPOFOL;  Surgeon: Rush Landmark Telford Nab., MD;  Location: [place];  Service: Gastroenterology;  Laterality: N/A;   HEMOSTASIS CLIP PLACEMENT  10/31/2019   Procedure: HEMOSTASIS CLIP PLACEMENT;  Surgeon: Irving Copas., MD;  Location: [place];  Service: Gastroenterology;;   HERNIA REPAIR     HYSTEROSCOPY WITH D & C N/A 02/16/2017   Procedure: DILATATION AND CURETTAGE /HYSTEROSCOPY;  Surgeon:  Defrancesco, Alanda Slim, MD;  Location: [facility];  Service: Gynecology;  Laterality: N/A;   JOINT REPLACEMENT Left    knee   kidney removal Left    KIDNEY SURGERY Left 1979   left kidney removed   LEFT HEART CATH AND CORONARY ANGIOGRAPHY N/A 10/21/2017   Procedure: LEFT HEART CATH AND CORONARY ANGIOGRAPHY;  Surgeon: Teodoro Spray, MD;  Location: [place] [facility];  Service: Cardiovascular;  Laterality: N/A;   POLYPECTOMY  10/31/2019   Procedure: POLYPECTOMY;  Surgeon: Rush Landmark Telford Nab., MD;  Location: [place];  Service: Gastroenterology;;   TOTAL KNEE  ARTHROPLASTY Left 07/06/2017   Procedure: TOTAL KNEE ARTHROPLASTY;  Surgeon: Lovell Sheehan, MD;  Location: [facility];  Service: Orthopedics;  Laterality: Left;   TOTAL KNEE ARTHROPLASTY Right 02/25/2021   Procedure: TOTAL KNEE ARTHROPLASTY;  Surgeon: Lovell Sheehan, MD;  Location: [facility];  Service: Orthopedics;  Laterality: Right;   UPPER ESOPHAGEAL ENDOSCOPIC ULTRASOUND (EUS) N/A 10/31/2019   Procedure: UPPER ESOPHAGEAL ENDOSCOPIC ULTRASOUND (EUS);  Surgeon: Irving Copas., MD;  Location: [place];  Service: Gastroenterology;  Laterality: N/A;   WRIST ARTHROSCOPY Left 1970s   XI ROBOTIC ASSISTED VENTRAL HERNIA N/A 12/01/2019   Procedure: XI ROBOTIC ASSISTED VENTRAL HERNIA;  Surgeon: Jules Husbands, MD;  Location: [facility];  Service: General;  Laterality: N/A;    There were no vitals filed for this visit.   Subjective Assessment - 05/20/21 1302     Subjective Patient reports she has no pain today. States the last pain she had was on Saturday in the left hip after walking. She states the buckling in the right knee is getting better but is still happening. Arrives with [facility]. States BP has been high. States walking up hill is the hardest for her.    Pertinent History Patient is a 70 year old female who presents to outpatient physical therapy with a referral s/p R TKA (02/25/2021). This patient's chief complaints consist of R knee and leg pain and limited ROM, leading to the following functional deficits: difficulty with walking, standing, standing from sitting, going up/down stairs, bed mobility, getting in the shower/tub, getting out of performing ADLs such as sweeping, cooking, cleaning, doing dishes. Relevant past medical history and comorbidities include: asthma, sleep apnea, HTN, CAD, Afib, Anxiety, depression, GERD, renal insufficiency, arthritis, and CKD, L TKA, obesity. Patient denies, history of cancer, stroke, seizure, major lung or heart problems, unexplained weight loss, numbness  in groin, bowel or bladder changes.    Limitations Standing;Walking;House hold activities;Other (comment);Lifting;Sitting   Difficulty with walking, standing, standing from sitting, going up/down stairs, bed mobility, getting in the shower/tub, getting out of performing ADLs such as sweeping, cooking, cleaning, doing dishes   How long can you walk comfortably? 2 minutes    Patient Stated Goals "walk without a walker and without a cane"    Currently in Pain? No/denies    Pain Onset Other (comment)   Surgical pain onset 02/25/2021. Radiating R leg pain occured prior to surgery             OBJECTIVE   VITALS All BP taken in seated position at left wrist passively supported on table at heart level.  At rest prior to exercise:  BP 146/78 mmHg, HR 70 bpm. Automatic adult cuff, SpO2 96% Sitting following episode of lightheadedness following knee extensions: BP 148/80 mmHg, HR 73-80 bpm. Automatic adult cuff, SpO2 97%      TREATMENT:  Therapeutic exercise: to centralize symptoms and improve  ROM, strength, muscular endurance, and activity tolerance required for successful completion of functional activities.  - vitals to assess safety prior to exercise (see above).  Ambulation over ground outside over sloped and uneven ground: 13 min with supervision - CGA, intermittently with and without SPC.  HR up to 101 bpm SpO2 96% directly after (one R knee buckle going up last hill, limited by L hip pain, no heart palpitations noted today).  - OMEGA leg extension machine, 3x10 each side R = 10#, L = 20/20/15#.   - vitals assessed after pt became lightheaded after standing from knee extension machine. (See above). - ambulation 2x~50 feet with CGA- min A and SPC. First bout from gym to outside bench by door where she felt very lightheaded and needed Min A to sit down safely. 2nd bout from bench to transportation with no episode of lightheadedness.    Pt required multimodal cuing for proper technique and to  facilitate improved neuromuscular control, strength, range of motion, and functional ability resulting in improved performance and form.      HOME EXERCISE PLAN Access Code: [email or web address] URL: [email or web address] Date: 05/15/2021 Prepared by: Rosita Kea   Exercises Staggered Sit-to-Stand - 1 x daily - 3 sets - 10 reps Seated Long Arc Quad - 3 sets - 20 reps - 5 hold Seated Knee Flexion Stretch - 3 sets - 30 seconds. hold Standing Terminal Knee Extension with Resistance - 1-2 x daily - 20 reps - 5 seconds hold   PT Education - 05/20/21 1304     Education Details exercise purpose/form.    Person(s) Educated Patient    Methods Explanation;Demonstration;Tactile cues;Verbal cues    Comprehension Verbalized understanding;Returned demonstration;Verbal cues required;Tactile cues required;Need further instruction              PT Short Term Goals - 04/10/21 1447       PT SHORT TERM GOAL #1   Title Patient will be independent with inital home exercise program to improve strength/mobility and functional independence with ADLs.    Baseline Initial HEP provided at eval - more formal HEP to be provided at second visit.    Time 3    Period Weeks    Status Achieved    Target Date 04/09/21               PT Long Term Goals - 05/08/21 1332       PT LONG TERM GOAL #1   Title Patient will be independent with long term home exercise program to improve strength/mobility and functional independence with ADLs.    Baseline Initial HEP provided at eval - more formal HEP to be provided at second visit (03/19/2021); currently participating in appropriate HEP (04/24/2021);   TARGET DATE FOR ALL LONG TERM GOALS: 06/11/2021   Time 12    Period Weeks    Status Partially Met   TARGET DATE FOR ALL LONG TERM GOALS: 06/11/2021     PT LONG TERM GOAL #2   Title Demonstrate improved FOTO score by 10 units or greater to demonstrate improvement in overall condition and self-reported functional  ability.    Baseline 58 (03/19/2021); 57 (05/08/2021);    Time 12    Period Weeks    Status On-going      PT LONG TERM GOAL #3   Title Pt will increase R knee extension/flexion strength to at least a 5/5 MMT grade without pain in order to demonstrate improvement in strength and function    Baseline 3+ strength limited  by pain.    Status On-going      PT LONG TERM GOAL #4   Title Patient will ambulate equal or greater than 1000 feet on 6 Minute Walk Test with no AD to demonstrate improved community and household mobility.    Baseline Patient unable to walk greater than 2 minutes (03/19/2021); ambulated 1000 feet with RW and CGA. L leg buckled one time per patient report. (04/24/2021)    Time 12    Period Weeks    Status Partially Met      PT LONG TERM GOAL #5   Title Patient will improve R knee flexion to 120 degrees or greater to demonstrate improvements in mobility and community ambulation without compensation.    Baseline 93 degrees (03/19/2021)    Time 12    Period Weeks    Status On-going      PT LONG TERM GOAL #6   Title Patient will improve R knee extension to 0 degrees or greater to demonstrate improvements in mobility and community ambulation without compensation.    Baseline -11 degrees (03/19/2021)    Time 12    Period Weeks    Status On-going      PT LONG TERM GOAL #7   Title Patient will reduce 5 Time Sit To Stand to <15 seconds to reduce fall risk and demonstrate improved transfer/gait ability.    Baseline 16 seconds without UE support (04/24/2021).    Time 12    Period Weeks    Status Partially Met                   Plan - 05/20/21 1347     Clinical Impression Statement Patient tolerated treatment with some difficulty due to feeling light headed when coming up hill when walking and after knee extension machine, and left hip pain that appears to be coming from low back per pt's description of past PT treatments for L hip pain during ambulation and with left  knee extension at the machine.  Continues to be appropriately challenged at B quads by knee extension machine but had episode of lightheadedness that lasted for several minutes following rising from knee extension machine. Vitals WFL but still took several min of sitting to clear feeling. Patient had another episode of lightheadedness while on her way out to the bench she likes to sit while waiting for he ride and needed min A to get there safely. Patient then ambulated with [facility] and CGA to her transportation service. She insisted on going home and refused offers to call EMS or stay at the clinic longer to lay down. Later called and reported she got home safe.  Patient would benefit from continued management of limiting condition by skilled physical therapist to address remaining impairments and functional limitations to work towards stated goals and return to PLOF or maximal functional independence.    Personal Factors and Comorbidities Age;Comorbidity 3+;Past/Current Experience    Comorbidities asthma, sleep apnea, HTN, CAD, Afib, Anxiety, depression, GERD, renal insufficiency, arthritis, and CKD, L TKA, obesity    Examination-Activity Limitations Bathing;Sit;Stand;Bed Mobility;Locomotion Level;Bend;Dressing;Squat;Transfers;Hygiene/Grooming;Stairs    Examination-Participation Restrictions Cleaning;Yard Work;Community Activity;Laundry    Stability/Clinical Decision Making Stable/Uncomplicated    Rehab Potential Good    PT Frequency 2x / week    PT Duration 12 weeks    PT Treatment/Interventions ADLs/Self Care Home Management;Cryotherapy;Electrical Stimulation;Moist Heat;Gait training;Functional mobility training;Stair training;Balance training;Therapeutic exercise;Therapeutic activities;Neuromuscular re-education;Patient/family education;Manual techniques;Scar mobilization;Passive range of motion;Joint Manipulations;Dry needling    PT Next Visit Plan Progress  LE strength and mobility as tolerated.    PT  Home Exercise Plan Medbridge Access Code: [email or web address]    Consulted and Agree with Plan of Care Patient             Patient will benefit from skilled therapeutic intervention in order to improve the following deficits and impairments:  Abnormal gait, Decreased activity tolerance, Decreased endurance, Decreased range of motion, Decreased skin integrity, Decreased strength, Hypomobility, Impaired perceived functional ability, Impaired sensation, Improper body mechanics, Pain, Decreased balance, Decreased mobility, Difficulty walking, Increased edema, Impaired flexibility, Obesity  Visit Diagnosis: Acute pain of right knee  Muscle weakness (generalized)  Stiffness of right knee, not elsewhere classified  Difficulty in walking, not elsewhere classified     Problem List Patient Active Problem List   Diagnosis Date Noted   History of total knee arthroplasty, right 02/25/2021   Intestinal metaplasia of gastric cardia 11/07/2020   Mild intermittent asthma without complication 76/28/3151   Shortness of breath 08/08/2020   Vaginal candidiasis 08/08/2020   Atherosclerosis of aorta (HCC) 07/09/2020   Acute diverticulitis 05/13/2020   Fever blister 10/15/2019   Cerumen in auditory canal on examination 10/15/2019   Bilateral hearing loss 10/15/2019   Need for vaccination against Streptococcus pneumoniae using pneumococcal conjugate vaccine 7 10/15/2019   Encounter for screening mammogram for malignant neoplasm of breast 10/15/2019   Colon cancer screening 10/15/2019   Weakness 09/02/2019   Headache disorder 09/02/2019   Stage 3b chronic kidney disease ([place]) 08/10/2019   Elevated erythrocyte sedimentation rate 11/02/2018   Lumbar spondylosis 11/02/2018   PVC's (premature ventricular contractions) 10/18/2018   Primary osteoarthritis of right knee 09/15/2018   Encounter for pre-operative examination 09/15/2018   Obstructive sleep apnea 09/15/2018   Calculus of gallbladder without  cholecystitis without obstruction 09/15/2018   Calculus of gallbladder with cholecystitis without biliary obstruction 01/27/2018   Restless leg syndrome 01/27/2018   Generalized abdominal pain 01/11/2018   Cyst of pancreas 01/11/2018   Other specified disorders of kidney and ureter 01/11/2018   Primary insomnia 01/11/2018   Paroxysmal atrial fibrillation ([place]) 11/12/2017   Essential hypertension 10/25/2017   Mixed hyperlipidemia 10/25/2017   Low back pain at multiple sites 10/25/2017   Dysuria 10/25/2017   Encounter for general adult medical examination with abnormal findings 10/25/2017   Vitamin D deficiency 10/25/2017   Near syncope    Symptomatic bradycardia 10/19/2017   Carpal tunnel syndrome 10/12/2017   Primary osteoarthritis of left knee 07/09/2017   History of total knee arthroplasty 07/06/2017   Chest pain 06/29/2017   Postop check 02/25/2017   Impingement syndrome of shoulder region 01/20/2017   Neck pain 01/20/2017   Obesity (BMI 35.0-39.9 without comorbidity) 07/15/2016   Endometrial polyp 07/15/2016   Asymptomatic postmenopausal state 07/15/2016   Morbid obesity ([place]) 07/15/2016   Family history of breast cancer in first degree relative 07/15/2016   Family history of ovarian cancer 07/15/2016   Knee pain 07/04/2016   Bilateral leg pain 06/24/2016    Everlean Alstrom. Graylon Good, PT, DPT 05/20/21, 3:54 PM   [facility] [address] [place], [address] Phone: [phone]   Fax:  [phone]  Name: Whitney [name] MRN: [id number] Date of Birth: 11-29-1950

## 2021-05-22 ENCOUNTER — Ambulatory Visit: Payer: Medicare Other | Admitting: Physical Therapy

## 2021-05-24 DIAGNOSIS — M7062 Trochanteric bursitis, left hip: Secondary | ICD-10-CM | POA: Diagnosis not present

## 2021-05-27 ENCOUNTER — Ambulatory Visit: Payer: Medicare Other | Admitting: Physical Therapy

## 2021-05-27 ENCOUNTER — Encounter: Payer: Self-pay | Admitting: Physical Therapy

## 2021-05-27 DIAGNOSIS — R262 Difficulty in walking, not elsewhere classified: Secondary | ICD-10-CM | POA: Diagnosis not present

## 2021-05-27 DIAGNOSIS — M25561 Pain in right knee: Secondary | ICD-10-CM | POA: Diagnosis not present

## 2021-05-27 DIAGNOSIS — M25661 Stiffness of right knee, not elsewhere classified: Secondary | ICD-10-CM

## 2021-05-27 DIAGNOSIS — M6281 Muscle weakness (generalized): Secondary | ICD-10-CM | POA: Diagnosis not present

## 2021-05-27 NOTE — Therapy (Signed)
[facility] [address], [place], [address] Phone: [phone]   Fax:  [phone]  Physical Therapy Treatment  Patient Details  Name: Whitney [name] MRN: [id number] Date of Birth: [birth year] Referring Provider (PT): Kurtis Bushman, MD.   Encounter Date: 05/27/2021   PT End of Session - 05/27/21 1316     Visit Number 13    Number of Visits 24    Date for PT Re-Evaluation 06/11/21    Authorization Type [employer] MEDICARE (Referring periord from 03/19/2021)    Progress Note Due on Visit 10    PT Start Time 1300    PT Stop Time 1340    PT Time Calculation (min) 40 min    Equipment Utilized During Treatment Gait belt;Other (comment)   SPC   Activity Tolerance Patient tolerated treatment well    Behavior During Therapy WFL for tasks assessed/performed             Past Medical History:  Diagnosis Date   Abnormal Pap smear of cervix    Pt states she had colposcopy    Anxiety    Aortic atherosclerosis (HCC)    Arthritis    CAD (coronary artery disease)    Chronic kidney disease    s/p LEFT nephrectomy   Chronic lower back pain    COVID    Depression    Diverticulitis    Dyspnea    History of methicillin resistant staphylococcus aureus (MRSA) 12/21/2020   nasal swab pcr   Hx of unilateral nephrectomy 1979   Left Nephrectomy   Hypertension    Lower extremity edema    PAF (paroxysmal atrial fibrillation) (HCC)    Palpitations    PMB (postmenopausal bleeding)    Restless leg syndrome    Sleep apnea    has no cpap   Symptomatic bradycardia    Vitamin D deficiency     Past Surgical History:  Procedure Laterality Date   BIOPSY  10/31/2019   Procedure: BIOPSY;  Surgeon: Irving Copas., MD;  Location: [place];  Service: Gastroenterology;;   CARDIAC CATHETERIZATION     CHOLECYSTECTOMY     COLONOSCOPY WITH PROPOFOL N/A 06/13/2019   Procedure: COLONOSCOPY WITH PROPOFOL;  Surgeon: [name], MD;  Location: [facility] ENDOSCOPY;  Service: Gastroenterology;  Laterality: N/A;   COLONOSCOPY WITH PROPOFOL N/A 08/12/2019   Procedure: COLONOSCOPY WITH PROPOFOL;  Surgeon: [name], MD;  Location: [facility] ENDOSCOPY;  Service: Gastroenterology;  Laterality: N/A;   COLONOSCOPY WITH PROPOFOL N/A 09/19/2019   Procedure: COLONOSCOPY WITH PROPOFOL;  Surgeon: [name], MD;  Location: [facility] ENDOSCOPY;  Service: Gastroenterology;  Laterality: N/A;   DILATION AND CURETTAGE OF UTERUS     ESOPHAGOGASTRODUODENOSCOPY (EGD) WITH PROPOFOL N/A 06/13/2019   Procedure: ESOPHAGOGASTRODUODENOSCOPY (EGD) WITH PROPOFOL;  Surgeon: [name], MD;  Location: [facility] ENDOSCOPY;  Service: Gastroenterology;  Laterality: N/A;  Pt will go for COVID test on 34-2-87 as she uses public transportation and will be in the area on that day.    ESOPHAGOGASTRODUODENOSCOPY (EGD) WITH PROPOFOL N/A 10/31/2019   Procedure: ESOPHAGOGASTRODUODENOSCOPY (EGD) WITH PROPOFOL;  Surgeon: Rush Landmark Telford Nab., MD;  Location: [place];  Service: Gastroenterology;  Laterality: N/A;   HEMOSTASIS CLIP PLACEMENT  10/31/2019   Procedure: HEMOSTASIS CLIP PLACEMENT;  Surgeon: Irving Copas., MD;  Location: [place];  Service: Gastroenterology;;   HERNIA REPAIR     HYSTEROSCOPY WITH D & C N/A 02/16/2017   Procedure: DILATATION AND CURETTAGE /HYSTEROSCOPY;  Surgeon:  Defrancesco, Alanda Slim, MD;  Location: [facility];  Service: Gynecology;  Laterality: N/A;   JOINT REPLACEMENT Left    knee   kidney removal Left    KIDNEY SURGERY Left 1979   left kidney removed   LEFT HEART CATH AND CORONARY ANGIOGRAPHY N/A 10/21/2017   Procedure: LEFT HEART CATH AND CORONARY ANGIOGRAPHY;  Surgeon: Teodoro Spray, MD;  Location: [place] [facility];  Service: Cardiovascular;  Laterality: N/A;   POLYPECTOMY  10/31/2019   Procedure: POLYPECTOMY;  Surgeon: Rush Landmark Telford Nab., MD;  Location: [place];  Service: Gastroenterology;;   TOTAL KNEE  ARTHROPLASTY Left 07/06/2017   Procedure: TOTAL KNEE ARTHROPLASTY;  Surgeon: Lovell Sheehan, MD;  Location: [facility];  Service: Orthopedics;  Laterality: Left;   TOTAL KNEE ARTHROPLASTY Right 02/25/2021   Procedure: TOTAL KNEE ARTHROPLASTY;  Surgeon: Lovell Sheehan, MD;  Location: [facility];  Service: Orthopedics;  Laterality: Right;   UPPER ESOPHAGEAL ENDOSCOPIC ULTRASOUND (EUS) N/A 10/31/2019   Procedure: UPPER ESOPHAGEAL ENDOSCOPIC ULTRASOUND (EUS);  Surgeon: Irving Copas., MD;  Location: [place];  Service: Gastroenterology;  Laterality: N/A;   WRIST ARTHROSCOPY Left 1970s   XI ROBOTIC ASSISTED VENTRAL HERNIA N/A 12/01/2019   Procedure: XI ROBOTIC ASSISTED VENTRAL HERNIA;  Surgeon: Jules Husbands, MD;  Location: [facility];  Service: General;  Laterality: N/A;    There were no vitals filed for this visit.   Subjective Assessment - 05/27/21 1301     Subjective Patient reports she saw Dr. Harlow Mares and her knee buckled in his office and he told her to stop walking and to do the recumbent bike. He wants her to go see his PCP (she is scheduled for November) after she told them she is having trouble with the dizziness. She had the dizziness again last week when she was working with her neice. She states she had a water leak and missed her last PT appointment. There is mold there and Carlynn Spry (PA for Dr. Harlow Mares) said she could go to PCP for a blood test associated with mold. He also diagnosed her with left hip bursitis. She currently does not have pain. She received a steroid injection in the left lateral hip that has helped some. She was given another order for PT for her knee. States she has a slight headache so she took some aspirin before she came.    Pertinent History Patient is a 70 year old female who presents to outpatient physical therapy with a referral s/p R TKA (02/25/2021). This patient's chief complaints consist of R knee and leg pain and limited ROM, leading to the following  functional deficits: difficulty with walking, standing, standing from sitting, going up/down stairs, bed mobility, getting in the shower/tub, getting out of performing ADLs such as sweeping, cooking, cleaning, doing dishes. Relevant past medical history and comorbidities include: asthma, sleep apnea, HTN, CAD, Afib, Anxiety, depression, GERD, renal insufficiency, arthritis, and CKD, L TKA, obesity. Patient denies, history of cancer, stroke, seizure, major lung or heart problems, unexplained weight loss, numbness in groin, bowel or bladder changes.    Limitations Standing;Walking;House hold activities;Other (comment);Lifting;Sitting   Difficulty with walking, standing, standing from sitting, going up/down stairs, bed mobility, getting in the shower/tub, getting out of performing ADLs such as sweeping, cooking, cleaning, doing dishes   How long can you walk comfortably? 2 minutes    Patient Stated Goals "walk without a walker and without a cane"    Currently in Pain? No/denies    Pain Onset  Other (comment)   Surgical pain onset 02/25/2021. Radiating R leg pain occured prior to surgery              OBJECTIVE   VITALS All BP taken in seated position at left wrist passively supported on table at heart level.  At rest prior to exercise:  BP 154/78 mmHg, HR 66 bpm. Automatic adult cuff, SpO2 100%    TREATMENT:  Therapeutic exercise: to centralize symptoms and improve ROM, strength, muscular endurance, and activity tolerance required for successful completion of functional activities.  - vitals to assess safety prior to exercise (see above).  - NuStep level 5 using bilateral lower extremities only. Seat setting 9.  For improved extremity mobility, muscular endurance, and activity tolerance; and to induce the analgesic effect of aerobic exercise, stimulate improved joint nutrition, and prepare body structures and systems for following interventions. x 5:19  minutes. Average SPM = 91. (Feels fatigue in  B LE).  - OMEGA leg extension machine, 6x5 each side R = 10#, L = 25/25/20/20/20/20#. cuing to breathe out each rep and waited for breathing to calm between sets.  - seated diaphragmatic breathing (patient unable to coordinate and continues to chest breathe despite max cuing and handout - would benefit from further practice at future session).   - cuing to stand still in front of seat for a minute or two before walking after each exercise.    Pt required multimodal cuing for proper technique and to facilitate improved neuromuscular control, strength, range of motion, and functional ability resulting in improved performance and form.      HOME EXERCISE PLAN Access Code: [email or web address] URL: [email or web address] Date: 05/15/2021 Prepared by: Rosita Kea   Exercises Staggered Sit-to-Stand - 1 x daily - 3 sets - 10 reps Seated Long Arc Quad - 3 sets - 20 reps - 5 hold Seated Knee Flexion Stretch - 3 sets - 30 seconds. hold Standing Terminal Knee Extension with Resistance - 1-2 x daily - 20 reps - 5 seconds hold    PT Education - 05/27/21 1316     Education Details exercise purpose/form.    Person(s) Educated Patient    Methods Explanation;Demonstration;Tactile cues;Verbal cues    Comprehension Verbalized understanding;Returned demonstration;Verbal cues required;Tactile cues required;Need further instruction              PT Short Term Goals - 04/10/21 1447       PT SHORT TERM GOAL #1   Title Patient will be independent with inital home exercise program to improve strength/mobility and functional independence with ADLs.    Baseline Initial HEP provided at eval - more formal HEP to be provided at second visit.    Time 3    Period Weeks    Status Achieved    Target Date 04/09/21               PT Long Term Goals - 05/08/21 1332       PT LONG TERM GOAL #1   Title Patient will be independent with long term home exercise program to improve strength/mobility and  functional independence with ADLs.    Baseline Initial HEP provided at eval - more formal HEP to be provided at second visit (03/19/2021); currently participating in appropriate HEP (04/24/2021);   TARGET DATE FOR ALL LONG TERM GOALS: 06/11/2021   Time 12    Period Weeks    Status Partially Met   TARGET DATE FOR ALL LONG TERM GOALS: 06/11/2021     PT LONG TERM  GOAL #2   Title Demonstrate improved FOTO score by 10 units or greater to demonstrate improvement in overall condition and self-reported functional ability.    Baseline 58 (03/19/2021); 57 (05/08/2021);    Time 12    Period Weeks    Status On-going      PT LONG TERM GOAL #3   Title Pt will increase R knee extension/flexion strength to at least a 5/5 MMT grade without pain in order to demonstrate improvement in strength and function    Baseline 3+ strength limited by pain.    Status On-going      PT LONG TERM GOAL #4   Title Patient will ambulate equal or greater than 1000 feet on 6 Minute Walk Test with no AD to demonstrate improved community and household mobility.    Baseline Patient unable to walk greater than 2 minutes (03/19/2021); ambulated 1000 feet with RW and CGA. L leg buckled one time per patient report. (04/24/2021)    Time 12    Period Weeks    Status Partially Met      PT LONG TERM GOAL #5   Title Patient will improve R knee flexion to 120 degrees or greater to demonstrate improvements in mobility and community ambulation without compensation.    Baseline 93 degrees (03/19/2021)    Time 12    Period Weeks    Status On-going      PT LONG TERM GOAL #6   Title Patient will improve R knee extension to 0 degrees or greater to demonstrate improvements in mobility and community ambulation without compensation.    Baseline -11 degrees (03/19/2021)    Time 12    Period Weeks    Status On-going      PT LONG TERM GOAL #7   Title Patient will reduce 5 Time Sit To Stand to <15 seconds to reduce fall risk and demonstrate improved  transfer/gait ability.    Baseline 16 seconds without UE support (04/24/2021).    Time 12    Period Weeks    Status Partially Met                   Plan - 05/27/21 1333     Clinical Impression Statement Patient arrives today with instructions from referring clinician to stop walking as much and concentrate on machines. Session continued to focus on quad strength to prevent buckling. Heavy cuing and focus on breathing more gently during exertion to decrease patient's tendency to valsalva when exerting herself. Patient was able to complete more sets of less reps on quad extension machine. Did not have lightheadedness following as has had at previous sessions. Vitals monitored to improve safety. Patient attempted to start learning diaphragmatic breathing but was unable to break motor pattern with chest dominant breathing. Patient would benefit from continued management of limiting condition by skilled physical therapist to address remaining impairments and functional limitations to work towards stated goals and return to PLOF or maximal functional independence.    Personal Factors and Comorbidities Age;Comorbidity 3+;Past/Current Experience    Comorbidities asthma, sleep apnea, HTN, CAD, Afib, Anxiety, depression, GERD, renal insufficiency, arthritis, and CKD, L TKA, obesity    Examination-Activity Limitations Bathing;Sit;Stand;Bed Mobility;Locomotion Level;Bend;Dressing;Squat;Transfers;Hygiene/Grooming;Stairs    Examination-Participation Restrictions Cleaning;Yard Work;Community Activity;Laundry    Stability/Clinical Decision Making Stable/Uncomplicated    Rehab Potential Good    PT Frequency 2x / week    PT Duration 12 weeks    PT Treatment/Interventions ADLs/Self Care Home Management;Cryotherapy;Electrical Stimulation;Moist Heat;Gait training;Functional mobility training;Stair training;Balance training;Therapeutic  exercise;Therapeutic activities;Neuromuscular re-education;Patient/family  education;Manual techniques;Scar mobilization;Passive range of motion;Joint Manipulations;Dry needling    PT Next Visit Plan Progress LE strength and mobility as tolerated.    PT Home Exercise Plan Medbridge Access Code: [email or web address]    Consulted and Agree with Plan of Care Patient             Patient will benefit from skilled therapeutic intervention in order to improve the following deficits and impairments:  Abnormal gait, Decreased activity tolerance, Decreased endurance, Decreased range of motion, Decreased skin integrity, Decreased strength, Hypomobility, Impaired perceived functional ability, Impaired sensation, Improper body mechanics, Pain, Decreased balance, Decreased mobility, Difficulty walking, Increased edema, Impaired flexibility, Obesity  Visit Diagnosis: Acute pain of right knee  Muscle weakness (generalized)  Stiffness of right knee, not elsewhere classified  Difficulty in walking, not elsewhere classified     Problem List Patient Active Problem List   Diagnosis Date Noted   History of total knee arthroplasty, right 02/25/2021   Intestinal metaplasia of gastric cardia 11/07/2020   Mild intermittent asthma without complication 02/58/5277   Shortness of breath 08/08/2020   Vaginal candidiasis 08/08/2020   Atherosclerosis of aorta (HCC) 07/09/2020   Acute diverticulitis 05/13/2020   Fever blister 10/15/2019   Cerumen in auditory canal on examination 10/15/2019   Bilateral hearing loss 10/15/2019   Need for vaccination against Streptococcus pneumoniae using pneumococcal conjugate vaccine 7 10/15/2019   Encounter for screening mammogram for malignant neoplasm of breast 10/15/2019   Colon cancer screening 10/15/2019   Weakness 09/02/2019   Headache disorder 09/02/2019   Stage 3b chronic kidney disease ([place]) 08/10/2019   Elevated erythrocyte sedimentation rate 11/02/2018   Lumbar spondylosis 11/02/2018   PVC's (premature ventricular contractions) 10/18/2018    Primary osteoarthritis of right knee 09/15/2018   Encounter for pre-operative examination 09/15/2018   Obstructive sleep apnea 09/15/2018   Calculus of gallbladder without cholecystitis without obstruction 09/15/2018   Calculus of gallbladder with cholecystitis without biliary obstruction 01/27/2018   Restless leg syndrome 01/27/2018   Generalized abdominal pain 01/11/2018   Cyst of pancreas 01/11/2018   Other specified disorders of kidney and ureter 01/11/2018   Primary insomnia 01/11/2018   Paroxysmal atrial fibrillation ([place]) 11/12/2017   Essential hypertension 10/25/2017   Mixed hyperlipidemia 10/25/2017   Low back pain at multiple sites 10/25/2017   Dysuria 10/25/2017   Encounter for general adult medical examination with abnormal findings 10/25/2017   Vitamin D deficiency 10/25/2017   Near syncope    Symptomatic bradycardia 10/19/2017   Carpal tunnel syndrome 10/12/2017   Primary osteoarthritis of left knee 07/09/2017   History of total knee arthroplasty 07/06/2017   Chest pain 06/29/2017   Postop check 02/25/2017   Impingement syndrome of shoulder region 01/20/2017   Neck pain 01/20/2017   Obesity (BMI 35.0-39.9 without comorbidity) 07/15/2016   Endometrial polyp 07/15/2016   Asymptomatic postmenopausal state 07/15/2016   Morbid obesity ([place]) 07/15/2016   Family history of breast cancer in first degree relative 07/15/2016   Family history of ovarian cancer 07/15/2016   Knee pain 07/04/2016   Bilateral leg pain 06/24/2016    Everlean Alstrom. Graylon Good, PT, DPT 05/27/21, 1:48 PM   [facility] [address] [place], [address] Phone: [phone]   Fax:  [phone]  Name: Whitney [name] MRN: [id number] Date of Birth: 1950/12/30

## 2021-05-29 ENCOUNTER — Encounter: Payer: Self-pay | Admitting: Physical Therapy

## 2021-05-29 ENCOUNTER — Ambulatory Visit: Payer: Medicare Other | Admitting: Physical Therapy

## 2021-05-29 VITALS — BP 156/82 | HR 71

## 2021-05-29 DIAGNOSIS — M25561 Pain in right knee: Secondary | ICD-10-CM | POA: Diagnosis not present

## 2021-05-29 DIAGNOSIS — M6281 Muscle weakness (generalized): Secondary | ICD-10-CM | POA: Diagnosis not present

## 2021-05-29 DIAGNOSIS — R262 Difficulty in walking, not elsewhere classified: Secondary | ICD-10-CM

## 2021-05-29 DIAGNOSIS — M25661 Stiffness of right knee, not elsewhere classified: Secondary | ICD-10-CM

## 2021-05-29 NOTE — Progress Notes (Signed)
Bone density scan was normal, will discuss with patient at upcoming appt.

## 2021-05-29 NOTE — Therapy (Signed)
[facility] [address] [place], [address] Phone: [phone]   Fax:  [phone]  Physical Therapy Treatment  Patient Details  Name: Whitney [name] MRN: [id number] Date of Birth: [birth year] Referring Provider (PT): Kurtis Bushman, MD.   Encounter Date: 05/29/2021   PT End of Session - 05/29/21 1318     Visit Number 14    Number of Visits 24    Date for PT Re-Evaluation 06/11/21    Authorization Type [employer] MEDICARE (Referring periord from 03/19/2021)    Progress Note Due on Visit 10    PT Start Time 1308    PT Stop Time 1340    PT Time Calculation (min) 32 min    Equipment Utilized During Treatment Gait belt;Other (comment)   SPC   Activity Tolerance Patient tolerated treatment well    Behavior During Therapy WFL for tasks assessed/performed             Past Medical History:  Diagnosis Date   Abnormal Pap smear of cervix    Pt states she had colposcopy    Anxiety    Aortic atherosclerosis (HCC)    Arthritis    CAD (coronary artery disease)    Chronic kidney disease    s/p LEFT nephrectomy   Chronic lower back pain    COVID    Depression    Diverticulitis    Dyspnea    History of methicillin resistant staphylococcus aureus (MRSA) 12/21/2020   nasal swab pcr   Hx of unilateral nephrectomy 1979   Left Nephrectomy   Hypertension    Lower extremity edema    PAF (paroxysmal atrial fibrillation) (HCC)    Palpitations    PMB (postmenopausal bleeding)    Restless leg syndrome    Sleep apnea    has no cpap   Symptomatic bradycardia    Vitamin D deficiency     Past Surgical History:  Procedure Laterality Date   BIOPSY  10/31/2019   Procedure: BIOPSY;  Surgeon: Irving Copas., MD;  Location: [place];  Service: Gastroenterology;;   CARDIAC CATHETERIZATION     CHOLECYSTECTOMY     COLONOSCOPY WITH PROPOFOL N/A 06/13/2019   Procedure: COLONOSCOPY WITH PROPOFOL;  Surgeon: [name], MD;  Location: [facility] ENDOSCOPY;  Service: Gastroenterology;  Laterality: N/A;   COLONOSCOPY WITH PROPOFOL N/A 08/12/2019   Procedure: COLONOSCOPY WITH PROPOFOL;  Surgeon: [name], MD;  Location: [facility] ENDOSCOPY;  Service: Gastroenterology;  Laterality: N/A;   COLONOSCOPY WITH PROPOFOL N/A 09/19/2019   Procedure: COLONOSCOPY WITH PROPOFOL;  Surgeon: [name], MD;  Location: [facility] ENDOSCOPY;  Service: Gastroenterology;  Laterality: N/A;   DILATION AND CURETTAGE OF UTERUS     ESOPHAGOGASTRODUODENOSCOPY (EGD) WITH PROPOFOL N/A 06/13/2019   Procedure: ESOPHAGOGASTRODUODENOSCOPY (EGD) WITH PROPOFOL;  Surgeon: [name], MD;  Location: [facility] END[facility]COPY;  Service: Gastroenterology;  Laterality: N/A;  Pt will go for COVID test on 46-8-03 as she uses public transportation and will be in the area on that day.    ESOPHAGOGASTRODUODENOSCOPY (EGD) WITH PROPOFOL N/A 10/31/2019   Procedure: ESOPHAGOGASTRODUODENOSCOPY (EGD) WITH PROPOFOL;  Surgeon: Rush Landmark Telford Nab., MD;  Location: [place];  Service: Gastroenterology;  Laterality: N/A;   HEMOSTASIS CLIP PLACEMENT  10/31/2019   Procedure: HEMOSTASIS CLIP PLACEMENT;  Surgeon: Irving Copas., MD;  Location: [place];  Service: Gastroenterology;;   HERNIA REPAIR     HYSTEROSCOPY WITH D & C N/A 02/16/2017   Procedure: DILATATION AND CURETTAGE /HYSTEROSCOPY;  Surgeon:  Defrancesco, Alanda Slim, MD;  Location: [facility];  Service: Gynecology;  Laterality: N/A;   JOINT REPLACEMENT Left    knee   kidney removal Left    KIDNEY SURGERY Left 1979   left kidney removed   LEFT HEART CATH AND CORONARY ANGIOGRAPHY N/A 10/21/2017   Procedure: LEFT HEART CATH AND CORONARY ANGIOGRAPHY;  Surgeon: Teodoro Spray, MD;  Location: [place] [facility];  Service: Cardiovascular;  Laterality: N/A;   POLYPECTOMY  10/31/2019   Procedure: POLYPECTOMY;  Surgeon: Rush Landmark Telford Nab., MD;  Location: [place];  Service: Gastroenterology;;   TOTAL KNEE  ARTHROPLASTY Left 07/06/2017   Procedure: TOTAL KNEE ARTHROPLASTY;  Surgeon: Lovell Sheehan, MD;  Location: [facility];  Service: Orthopedics;  Laterality: Left;   TOTAL KNEE ARTHROPLASTY Right 02/25/2021   Procedure: TOTAL KNEE ARTHROPLASTY;  Surgeon: Lovell Sheehan, MD;  Location: [facility];  Service: Orthopedics;  Laterality: Right;   UPPER ESOPHAGEAL ENDOSCOPIC ULTRASOUND (EUS) N/A 10/31/2019   Procedure: UPPER ESOPHAGEAL ENDOSCOPIC ULTRASOUND (EUS);  Surgeon: Irving Copas., MD;  Location: [place];  Service: Gastroenterology;  Laterality: N/A;   WRIST ARTHROSCOPY Left 1970s   XI ROBOTIC ASSISTED VENTRAL HERNIA N/A 12/01/2019   Procedure: XI ROBOTIC ASSISTED VENTRAL HERNIA;  Surgeon: Jules Husbands, MD;  Location: [facility];  Service: General;  Laterality: N/A;    Vitals:   05/29/21 1317  BP: (!) 156/82  Pulse: 71  SpO2: 100%     Subjective Assessment - 05/29/21 1315     Subjective Patient arrives with SPC. Reports no dizziness or buckling since last PT session. States she has a slight headache but otherwise no pain. States she was a bit sore in her thighs after last PT session. State she will not be able to come back to PT until november 15 due to scheduling availability. States HEP is going well.    Pertinent History Patient is a 70 year old female who presents to outpatient physical therapy with a referral s/p R TKA (02/25/2021). This patient's chief complaints consist of R knee and leg pain and limited ROM, leading to the following functional deficits: difficulty with walking, standing, standing from sitting, going up/down stairs, bed mobility, getting in the shower/tub, getting out of performing ADLs such as sweeping, cooking, cleaning, doing dishes. Relevant past medical history and comorbidities include: asthma, sleep apnea, HTN, CAD, Afib, Anxiety, depression, GERD, renal insufficiency, arthritis, and CKD, L TKA, obesity. Patient denies, history of cancer, stroke, seizure,  major lung or heart problems, unexplained weight loss, numbness in groin, bowel or bladder changes.    Limitations Standing;Walking;House hold activities;Other (comment);Lifting;Sitting   Difficulty with walking, standing, standing from sitting, going up/down stairs, bed mobility, getting in the shower/tub, getting out of performing ADLs such as sweeping, cooking, cleaning, doing dishes   How long can you walk comfortably? 2 minutes    Patient Stated Goals "walk without a walker and without a cane"    Currently in Pain? No/denies    Pain Onset Other (comment)   Surgical pain onset 02/25/2021. Radiating R leg pain occured prior to surgery              OBJECTIVE   VITALS All BP taken in seated position at left wrist passively supported on table at heart level.  At rest prior to exercise:  BP 156/82 mmHg, HR 71 bpm. Automatic adult cuff, left wrist, SpO2 100%    TREATMENT:  Therapeutic exercise: to centralize symptoms and improve ROM, strength, muscular endurance,  and activity tolerance required for successful completion of functional activities.  - vitals to assess safety prior to exercise (see above).  - NuStep level 5 using bilateral lower extremities only. Seat setting 9.  For improved extremity mobility, muscular endurance, and activity tolerance; and to induce the analgesic effect of aerobic exercise, stimulate improved joint nutrition, and prepare body structures and systems for following interventions. x 5:19  minutes. Average SPM = 901. (Feels fatigue in B LE, cramping in left leg).  - sit <> stand from 18 inch chair 1x3 - sit <> stand from 18 inch chair with staggered stance biasing R LE, 1x15 (needed rest and reported heart palpitations after).  - standing hip abduction, 2x10 each side - standing diagonal hip abduction/extension with yellow theraband around ankles, 2x10 each side. (Palpitations afterwards) - seated diaphragmatic breathing (patient unable to coordinate and  continues to chest breathe despite max cuing and handout - would benefit from further practice at future session).  - Education on HEP including handout    Pt required multimodal cuing for proper technique and to facilitate improved neuromuscular control, strength, range of motion, and functional ability resulting in improved performance and form.      HOME EXERCISE PLAN Access Code: [email or web address] URL: [email or web address] Date: 05/29/2021 Prepared by: Rosita Kea  Exercises Staggered Sit-to-Stand - 1 x daily - 3 sets - 10 reps Diagonal Hip Extension with Resistance - 1 x daily - 3 sets - 10 reps - 1 second hold Seated Knee Flexion Stretch - 3 sets - 30 seconds. hold    PT Education - 05/29/21 1355     Education Details exercise purpose/form.    Person(s) Educated Patient    Methods Explanation;Demonstration;Tactile cues;Verbal cues    Comprehension Verbalized understanding;Returned demonstration;Verbal cues required;Tactile cues required;Need further instruction              PT Short Term Goals - 04/10/21 1447       PT SHORT TERM GOAL #1   Title Patient will be independent with inital home exercise program to improve strength/mobility and functional independence with ADLs.    Baseline Initial HEP provided at eval - more formal HEP to be provided at second visit.    Time 3    Period Weeks    Status Achieved    Target Date 04/09/21               PT Long Term Goals - 05/08/21 1332       PT LONG TERM GOAL #1   Title Patient will be independent with long term home exercise program to improve strength/mobility and functional independence with ADLs.    Baseline Initial HEP provided at eval - more formal HEP to be provided at second visit (03/19/2021); currently participating in appropriate HEP (04/24/2021);   TARGET DATE FOR ALL LONG TERM GOALS: 06/11/2021   Time 12    Period Weeks    Status Partially Met   TARGET DATE FOR ALL LONG TERM GOALS: 06/11/2021      PT LONG TERM GOAL #2   Title Demonstrate improved FOTO score by 10 units or greater to demonstrate improvement in overall condition and self-reported functional ability.    Baseline 58 (03/19/2021); 57 (05/08/2021);    Time 12    Period Weeks    Status On-going      PT LONG TERM GOAL #3   Title Pt will increase R knee extension/flexion strength to at least a 5/5 MMT grade without pain in order to demonstrate  improvement in strength and function    Baseline 3+ strength limited by pain.    Status On-going      PT LONG TERM GOAL #4   Title Patient will ambulate equal or greater than 1000 feet on 6 Minute Walk Test with no AD to demonstrate improved community and household mobility.    Baseline Patient unable to walk greater than 2 minutes (03/19/2021); ambulated 1000 feet with RW and CGA. L leg buckled one time per patient report. (04/24/2021)    Time 12    Period Weeks    Status Partially Met      PT LONG TERM GOAL #5   Title Patient will improve R knee flexion to 120 degrees or greater to demonstrate improvements in mobility and community ambulation without compensation.    Baseline 93 degrees (03/19/2021)    Time 12    Period Weeks    Status On-going      PT LONG TERM GOAL #6   Title Patient will improve R knee extension to 0 degrees or greater to demonstrate improvements in mobility and community ambulation without compensation.    Baseline -11 degrees (03/19/2021)    Time 12    Period Weeks    Status On-going      PT LONG TERM GOAL #7   Title Patient will reduce 5 Time Sit To Stand to <15 seconds to reduce fall risk and demonstrate improved transfer/gait ability.    Baseline 16 seconds without UE support (04/24/2021).    Time 12    Period Weeks    Status Partially Met                   Plan - 05/29/21 1354     Clinical Impression Statement Pateint tolerated treatment with some difficulty due to heart palpitations after strong exertion that limited her from completing  last planned exercise. Session focused on improving LE strength and updating HEP to allow patient to continue making progress until she can return in mid November. Vitals monitored for safety. No instances of unsteadiness or dizziness. Continues to be unable to coordinate diaphragmatic breathing. Patient would benefit from continued management of limiting condition by skilled physical therapist to address remaining impairments and functional limitations to work towards stated goals and return to PLOF or maximal functional independence.    Personal Factors and Comorbidities Age;Comorbidity 3+;Past/Current Experience    Comorbidities asthma, sleep apnea, HTN, CAD, Afib, Anxiety, depression, GERD, renal insufficiency, arthritis, and CKD, L TKA, obesity    Examination-Activity Limitations Bathing;Sit;Stand;Bed Mobility;Locomotion Level;Bend;Dressing;Squat;Transfers;Hygiene/Grooming;Stairs    Examination-Participation Restrictions Cleaning;Yard Work;Community Activity;Laundry    Stability/Clinical Decision Making Stable/Uncomplicated    Rehab Potential Good    PT Frequency 2x / week    PT Duration 12 weeks    PT Treatment/Interventions ADLs/Self Care Home Management;Cryotherapy;Electrical Stimulation;Moist Heat;Gait training;Functional mobility training;Stair training;Balance training;Therapeutic exercise;Therapeutic activities;Neuromuscular re-education;Patient/family education;Manual techniques;Scar mobilization;Passive range of motion;Joint Manipulations;Dry needling    PT Next Visit Plan Progress LE strength and mobility as tolerated.    PT Home Exercise Plan Medbridge Access Code: [email or web address]    Consulted and Agree with Plan of Care Patient             Patient will benefit from skilled therapeutic intervention in order to improve the following deficits and impairments:  Abnormal gait, Decreased activity tolerance, Decreased endurance, Decreased range of motion, Decreased skin integrity, Decreased  strength, Hypomobility, Impaired perceived functional ability, Impaired sensation, Improper body mechanics, Pain, Decreased balance, Decreased mobility, Difficulty walking, Increased edema,  Impaired flexibility, Obesity  Visit Diagnosis: Acute pain of right knee  Muscle weakness (generalized)  Stiffness of right knee, not elsewhere classified  Difficulty in walking, not elsewhere classified     Problem List Patient Active Problem List   Diagnosis Date Noted   History of total knee arthroplasty, right 02/25/2021   Intestinal metaplasia of gastric cardia 11/07/2020   Mild intermittent asthma without complication 15/72/6203   Shortness of breath 08/08/2020   Vaginal candidiasis 08/08/2020   Atherosclerosis of aorta (HCC) 07/09/2020   Acute diverticulitis 05/13/2020   Fever blister 10/15/2019   Cerumen in auditory canal on examination 10/15/2019   Bilateral hearing loss 10/15/2019   Need for vaccination against Streptococcus pneumoniae using pneumococcal conjugate vaccine 7 10/15/2019   Encounter for screening mammogram for malignant neoplasm of breast 10/15/2019   Colon cancer screening 10/15/2019   Weakness 09/02/2019   Headache disorder 09/02/2019   Stage 3b chronic kidney disease ([place]) 08/10/2019   Elevated erythrocyte sedimentation rate 11/02/2018   Lumbar spondylosis 11/02/2018   PVC's (premature ventricular contractions) 10/18/2018   Primary osteoarthritis of right knee 09/15/2018   Encounter for pre-operative examination 09/15/2018   Obstructive sleep apnea 09/15/2018   Calculus of gallbladder without cholecystitis without obstruction 09/15/2018   Calculus of gallbladder with cholecystitis without biliary obstruction 01/27/2018   Restless leg syndrome 01/27/2018   Generalized abdominal pain 01/11/2018   Cyst of pancreas 01/11/2018   Other specified disorders of kidney and ureter 01/11/2018   Primary insomnia 01/11/2018   Paroxysmal atrial fibrillation ([place])  11/12/2017   Essential hypertension 10/25/2017   Mixed hyperlipidemia 10/25/2017   Low back pain at multiple sites 10/25/2017   Dysuria 10/25/2017   Encounter for general adult medical examination with abnormal findings 10/25/2017   Vitamin D deficiency 10/25/2017   Near syncope    Symptomatic bradycardia 10/19/2017   Carpal tunnel syndrome 10/12/2017   Primary osteoarthritis of left knee 07/09/2017   History of total knee arthroplasty 07/06/2017   Chest pain 06/29/2017   Postop check 02/25/2017   Impingement syndrome of shoulder region 01/20/2017   Neck pain 01/20/2017   Obesity (BMI 35.0-39.9 without comorbidity) 07/15/2016   Endometrial polyp 07/15/2016   Asymptomatic postmenopausal state 07/15/2016   Morbid obesity ([place]) 07/15/2016   Family history of breast cancer in first degree relative 07/15/2016   Family history of ovarian cancer 07/15/2016   Knee pain 07/04/2016   Bilateral leg pain 06/24/2016   Everlean Alstrom. Graylon Good, PT, DPT 05/29/21, 1:56 PM   [facility] [address], [place], [address] Phone: [phone]   Fax:  [phone]  Name: Whitney [name] MRN: [id number] Date of Birth: Jun 03, 1951

## 2021-06-03 ENCOUNTER — Encounter: Payer: Medicare Other | Admitting: Physical Therapy

## 2021-06-04 ENCOUNTER — Encounter: Payer: Self-pay | Admitting: Gastroenterology

## 2021-06-04 ENCOUNTER — Ambulatory Visit: Payer: Medicare Other | Admitting: Anesthesiology

## 2021-06-04 ENCOUNTER — Other Ambulatory Visit: Payer: Self-pay

## 2021-06-04 ENCOUNTER — Ambulatory Visit
Admission: RE | Admit: 2021-06-04 | Discharge: 2021-06-04 | Disposition: A | Discharge status: Home / Self Care | Payer: Medicare Other | Attending: Gastroenterology | Admitting: Gastroenterology

## 2021-06-04 ENCOUNTER — Encounter
Admission: RE | Disposition: A | Discharge status: Home / Self Care | Payer: Self-pay | Source: Home / Self Care | Attending: Gastroenterology

## 2021-06-04 DIAGNOSIS — Z905 Acquired absence of kidney: Secondary | ICD-10-CM | POA: Diagnosis not present

## 2021-06-04 DIAGNOSIS — Z803 Family history of malignant neoplasm of breast: Secondary | ICD-10-CM | POA: Diagnosis not present

## 2021-06-04 DIAGNOSIS — Z888 Allergy status to other drugs, medicaments and biological substances status: Secondary | ICD-10-CM | POA: Diagnosis not present

## 2021-06-04 DIAGNOSIS — Z6841 Body Mass Index (BMI) 40.0 and over, adult: Secondary | ICD-10-CM | POA: Diagnosis not present

## 2021-06-04 DIAGNOSIS — K31A0 Gastric intestinal metaplasia, unspecified: Secondary | ICD-10-CM | POA: Diagnosis not present

## 2021-06-04 DIAGNOSIS — D126 Benign neoplasm of colon, unspecified: Secondary | ICD-10-CM | POA: Diagnosis not present

## 2021-06-04 DIAGNOSIS — K2950 Unspecified chronic gastritis without bleeding: Secondary | ICD-10-CM | POA: Diagnosis not present

## 2021-06-04 DIAGNOSIS — Z809 Family history of malignant neoplasm, unspecified: Secondary | ICD-10-CM | POA: Diagnosis not present

## 2021-06-04 DIAGNOSIS — E782 Mixed hyperlipidemia: Secondary | ICD-10-CM | POA: Diagnosis not present

## 2021-06-04 DIAGNOSIS — D125 Benign neoplasm of sigmoid colon: Secondary | ICD-10-CM | POA: Diagnosis not present

## 2021-06-04 DIAGNOSIS — K5732 Diverticulitis of large intestine without perforation or abscess without bleeding: Secondary | ICD-10-CM

## 2021-06-04 DIAGNOSIS — Z8616 Personal history of COVID-19: Secondary | ICD-10-CM | POA: Diagnosis not present

## 2021-06-04 DIAGNOSIS — D122 Benign neoplasm of ascending colon: Secondary | ICD-10-CM | POA: Diagnosis not present

## 2021-06-04 DIAGNOSIS — K5730 Diverticulosis of large intestine without perforation or abscess without bleeding: Secondary | ICD-10-CM | POA: Diagnosis not present

## 2021-06-04 DIAGNOSIS — K31A15 Gastric intestinal metaplasia without dysplasia, involving multiple sites: Secondary | ICD-10-CM | POA: Diagnosis not present

## 2021-06-04 DIAGNOSIS — Z7951 Long term (current) use of inhaled steroids: Secondary | ICD-10-CM | POA: Diagnosis not present

## 2021-06-04 DIAGNOSIS — K2970 Gastritis, unspecified, without bleeding: Secondary | ICD-10-CM | POA: Diagnosis not present

## 2021-06-04 DIAGNOSIS — Z79899 Other long term (current) drug therapy: Secondary | ICD-10-CM | POA: Diagnosis not present

## 2021-06-04 DIAGNOSIS — Z791 Long term (current) use of non-steroidal anti-inflammatories (NSAID): Secondary | ICD-10-CM | POA: Insufficient documentation

## 2021-06-04 DIAGNOSIS — K2960 Other gastritis without bleeding: Secondary | ICD-10-CM | POA: Diagnosis not present

## 2021-06-04 DIAGNOSIS — K635 Polyp of colon: Secondary | ICD-10-CM | POA: Diagnosis not present

## 2021-06-04 DIAGNOSIS — Z833 Family history of diabetes mellitus: Secondary | ICD-10-CM | POA: Diagnosis not present

## 2021-06-04 HISTORY — PX: ESOPHAGOGASTRODUODENOSCOPY (EGD) WITH PROPOFOL: SHX5813

## 2021-06-04 HISTORY — PX: COLONOSCOPY WITH PROPOFOL: SHX5780

## 2021-06-04 SURGERY — COLONOSCOPY WITH PROPOFOL
Anesthesia: General

## 2021-06-04 MED ORDER — PROPOFOL 500 MG/50ML IV EMUL
INTRAVENOUS | Status: AC
  Filled 2021-06-04: qty 50

## 2021-06-04 MED ORDER — DEXMEDETOMIDINE (PRECEDEX) IN NS 20 MCG/5ML (4 MCG/ML) IV SYRINGE
PREFILLED_SYRINGE | INTRAVENOUS | Status: DC | PRN
  Administered 2021-06-04: 8 ug via INTRAVENOUS

## 2021-06-04 MED ORDER — LIDOCAINE HCL (CARDIAC) PF 100 MG/5ML IV SOSY
PREFILLED_SYRINGE | INTRAVENOUS | Status: DC | PRN
  Administered 2021-06-04: 100 mg via INTRAVENOUS

## 2021-06-04 MED ORDER — SODIUM CHLORIDE 0.9 % IV SOLN: INTRAVENOUS | Status: DC

## 2021-06-04 MED ORDER — PROPOFOL 10 MG/ML IV BOLUS
INTRAVENOUS | Status: DC | PRN
  Administered 2021-06-04: 40 mg via INTRAVENOUS
  Administered 2021-06-04: 60 mg via INTRAVENOUS

## 2021-06-04 MED ORDER — PROPOFOL 500 MG/50ML IV EMUL
INTRAVENOUS | Status: DC | PRN
Start: 2021-06-04 — End: 2021-06-04
  Administered 2021-06-04: 150 ug/kg/min via INTRAVENOUS

## 2021-06-04 NOTE — H&P (Signed)
[name], MD [address] [place], [place], [place], [address] [address], [place], [place], [place], [address] Phone: [phone]  Fax: [phone]  Primary Care Physician:  Lavera Guise, MD   Pre-Procedure History & Physical: HPI:  Whitney [name] is a 70 y.o. female is here for an endoscopy and colonoscopy    Past Medical History:  Diagnosis Date   Abnormal Pap smear of cervix    Pt states she had colposcopy    Anxiety    Aortic atherosclerosis (HCC)    Arthritis    CAD (coronary artery disease)    Chronic kidney disease    s/p LEFT nephrectomy   Chronic lower back pain    COVID    Depression    Diverticulitis    Dyspnea    History of methicillin resistant staphylococcus aureus (MRSA) 12/21/2020   nasal swab pcr   Hx of unilateral nephrectomy 1979   Left Nephrectomy   Hypertension    Lower extremity edema    PAF (paroxysmal atrial fibrillation) (HCC)    Palpitations    PMB (postmenopausal bleeding)    Restless leg syndrome    Sleep apnea    has no cpap   Symptomatic bradycardia    Vitamin D deficiency     Past Surgical History:  Procedure Laterality Date   BIOPSY  10/31/2019   Procedure: BIOPSY;  Surgeon: Irving Copas., MD;  Location: [facility];  Service: Gastroenterology;;   CARDIAC CATHETERIZATION     CHOLECYSTECTOMY     COLONOSCOPY WITH PROPOFOL N/A 06/13/2019   Procedure: COLONOSCOPY WITH PROPOFOL;  Surgeon: [name], MD;  Location: [facility] ENDOSCOPY;  Service: Gastroenterology;  Laterality: N/A;   COLONOSCOPY WITH PROPOFOL N/A 08/12/2019   Procedure: COLONOSCOPY WITH PROPOFOL;  Surgeon: [name], MD;  Location: [facility] ENDOSCOPY;  Service: Gastroenterology;  Laterality: N/A;   COLONOSCOPY WITH PROPOFOL N/A 09/19/2019   Procedure: COLONOSCOPY WITH PROPOFOL;  Surgeon: [name], MD;  Location: [facility] ENDOSCOPY;  Service: Gastroenterology;  Laterality: N/A;   DILATION AND CURETTAGE OF UTERUS     ESOPHAGOGASTRODUODENOSCOPY (EGD)  WITH PROPOFOL N/A 06/13/2019   Procedure: ESOPHAGOGASTRODUODENOSCOPY (EGD) WITH PROPOFOL;  Surgeon: [name], MD;  Location: [facility] ENDOSCOPY;  Service: Gastroenterology;  Laterality: N/A;  Pt will go for COVID test on 59-9-35 as she uses public transportation and will be in the area on that day.    ESOPHAGOGASTRODUODENOSCOPY (EGD) WITH PROPOFOL N/A 10/31/2019   Procedure: ESOPHAGOGASTRODUODENOSCOPY (EGD) WITH PROPOFOL;  Surgeon: Rush Landmark Telford Nab., MD;  Location: [facility];  Service: Gastroenterology;  Laterality: N/A;   HEMOSTASIS CLIP PLACEMENT  10/31/2019   Procedure: HEMOSTASIS CLIP PLACEMENT;  Surgeon: Irving Copas., MD;  Location: [place];  Service: Gastroenterology;;   HERNIA REPAIR     HYSTEROSCOPY WITH D & C N/A 02/16/2017   Procedure: DILATATION AND CURETTAGE /HYSTEROSCOPY;  Surgeon: Brayton Mars, MD;  Location: [facility];  Service: Gynecology;  Laterality: N/A;   JOINT REPLACEMENT Left    knee   kidney removal Left    KIDNEY SURGERY Left 1979   left kidney removed   LEFT HEART CATH AND CORONARY ANGIOGRAPHY N/A 10/21/2017   Procedure: LEFT HEART CATH AND CORONARY ANGIOGRAPHY;  Surgeon: Teodoro Spray, MD;  Location: [place] [facility];  Service: Cardiovascular;  Laterality: N/A;   POLYPECTOMY  10/31/2019   Procedure: POLYPECTOMY;  Surgeon: Rush Landmark Telford Nab., MD;  Location: [place];  Service: Gastroenterology;;   TOTAL KNEE ARTHROPLASTY Left 07/06/2017  Procedure: TOTAL KNEE ARTHROPLASTY;  Surgeon: Lovell Sheehan, MD;  Location: [facility];  Service: Orthopedics;  Laterality: Left;   TOTAL KNEE ARTHROPLASTY Right 02/25/2021   Procedure: TOTAL KNEE ARTHROPLASTY;  Surgeon: Lovell Sheehan, MD;  Location: [facility];  Service: Orthopedics;  Laterality: Right;   UPPER ESOPHAGEAL ENDOSCOPIC ULTRASOUND (EUS) N/A 10/31/2019   Procedure: UPPER ESOPHAGEAL ENDOSCOPIC ULTRASOUND (EUS);  Surgeon: Irving Copas., MD;  Location: [facility];  Service:  Gastroenterology;  Laterality: N/A;   WRIST ARTHROSCOPY Left 1970s   XI ROBOTIC ASSISTED VENTRAL HERNIA N/A 12/01/2019   Procedure: XI ROBOTIC ASSISTED VENTRAL HERNIA;  Surgeon: Jules Husbands, MD;  Location: [facility];  Service: General;  Laterality: N/A;    Prior to Admission medications   Medication Sig Start Date End Date Taking? Authorizing Provider  albuterol (VENTOLIN HFA) 108 (90 Base) MCG/ACT inhaler Inhale 2 puffs into the lungs every 4 (four) hours as needed for wheezing or shortness of breath. 07/09/20  Yes Ronnell Freshwater, NP  amLODipine (NORVASC) 5 MG tablet TAKE 1 TABLET BY MOUTH TWICE DAILY 05/08/21  Yes Lavera Guise, MD  aspirin 81 MG chewable tablet Chew 1 tablet (81 mg total) by mouth 2 (two) times daily. 02/28/21  Yes Carlynn Spry, PA-C  carvedilol (COREG) 6.25 MG tablet Take 1 tablet (6.25 mg total) by mouth 2 (two) times daily with a meal. 12/17/20  Yes Lavera Guise, MD  Cholecalciferol (VITAMIN D) 50 MCG (2000 UT) tablet Take 2,000 Units by mouth in the morning and at bedtime.    Yes [provider]  ezetimibe (ZETIA) 10 MG tablet Take 1 tablet (10 mg total) by mouth daily. Patient taking differently: Take 10 mg by mouth every morning. 07/09/20  Yes Boscia, Greer Ee, NP  gabapentin (NEURONTIN) 100 MG capsule Take 200 mg by mouth 2 (two) times daily. 02/20/21  Yes [provider]  hydrALAZINE (APRESOLINE) 25 MG tablet Take 1 tablet (25 mg total) by mouth 3 (three) times daily. For HTN 12/17/20  Yes Lavera Guise, MD  methocarbamol (ROBAXIN) 500 MG tablet Take 1 tablet (500 mg total) by mouth 4 (four) times daily. 02/28/21  Yes Carlynn Spry, PA-C  diclofenac Sodium (VOLTAREN) 1 % GEL Apply 2 g topically daily as needed (Knee pain).    [provider]  docusate sodium (COLACE) 100 MG capsule Take 1 capsule (100 mg total) by mouth 2 (two) times daily. Patient not taking: Reported on 06/04/2021 02/28/21   Carlynn Spry, PA-C  flecainide (TAMBOCOR) 50 MG  tablet Take 50 mg by mouth as needed. Patient not taking: Reported on 06/04/2021    [provider]  HYDROcodone-acetaminophen (NORCO) 7.5-325 MG tablet Take 1 tablet by mouth every 4 (four) hours as needed for moderate pain (pain score 7-10). 02/28/21   Carlynn Spry, PA-C  orlistat (ALLI) 60 MG capsule Take 60 mg by mouth 3 (three) times daily with meals.    [provider]  rOPINIRole (REQUIP) 0.5 MG tablet Take 0.5-1 mg by mouth 2 (two) times daily as needed (pain).    [provider]    Allergies as of 04/30/2021 - Review Complete 04/30/2021  Allergen Reaction Noted   Enalapril Swelling    Lisinopril Swelling 05/26/2016   Ace inhibitors Swelling 11/13/2020    Family History  Problem Relation Age of Onset   Ovarian cancer Mother    Hypertension Father    Diabetes Father    Cancer Father    Breast cancer Sister  Diabetes Sister     Social History   Socioeconomic History   Marital status: Divorced    Spouse name: Not on file   Number of children: Not on file   Years of education: Not on file   Highest education level: Not on file  Occupational History   Not on file  Tobacco Use   Smoking status: Never   Smokeless tobacco: Never  Vaping Use   Vaping Use: Never used  Substance and Sexual Activity   Alcohol use: Yes    Alcohol/week: 1.0 standard drink    Types: 1 Standard drinks or equivalent per week    Comment: occasionally   Drug use: Yes    Frequency: 3.0 times per week    Types: Marijuana    Comment: 07/09/20 Pt states she uses for leg pain: marijuana; states no longer uses cocaine   Sexual activity: Yes    Birth control/protection: None  Other Topics Concern   Not on file  Social History Narrative   Lives with cousin.   Social Determinants of Health   Financial Resource Strain: Low Risk    Difficulty of Paying Living Expenses: Not very hard  Food Insecurity: Not on file  Transportation Needs: Not on file  Physical Activity:  Not on file  Stress: Not on file  Social Connections: Not on file  Intimate Partner Violence: Not on file    Review of Systems: See HPI, otherwise negative ROS  Physical Exam: BP (!) 184/100   Pulse 73   Temp (!) 96.1 F (35.6 C) (Temporal)   Resp 18   Ht 5\' 5"  (1.651 m)   Wt 109.8 kg   LMP 01/26/2017 Comment: age 40  SpO2 99%   BMI 40.27 kg/m  General:   Alert,  pleasant and cooperative in NAD Head:  Normocephalic and atraumatic. Neck:  Supple; no masses or thyromegaly. Lungs:  Clear throughout to auscultation, normal respiratory effort.    Heart:  +S1, +S2, Regular rate and rhythm, No edema. Abdomen:  Soft, nontender and nondistended. Normal bowel sounds, without guarding, and without rebound.   Neurologic:  Alert and  oriented x4;  grossly normal neurologically.  Impression/Plan: Whitney [name] is here for an endoscopy and colonoscopy  to be performed for  evaluation of gastric intestinal metaplasia and diverticulitis    Risks, benefits, limitations, and alternatives regarding endoscopy have been reviewed with the patient.  Questions have been answered.  All parties agreeable.   [name], MD  06/04/2021, 8:08 AM

## 2021-06-04 NOTE — Op Note (Signed)
[facility] Gastroenterology Patient Name: Whitney [name] Procedure Date: 06/04/2021 8:20 AM MRN: [id number] Account #: [id number] Date of Birth: [birth year] Admit Type: Outpatient Age: 70 Room: [facility] ENDO ROOM 2 Gender: Female Note Status: Finalized Instrument Name: Colonoscope [id number] Procedure:             Colonoscopy Indications:           Follow-up of diverticulitis Providers:             [name] MD, MD Referring MD:          Lavera Guise, MD (Referring MD) Medicines:             Monitored Anesthesia Care Complications:         No immediate complications. Procedure:             Pre-Anesthesia Assessment:                        - Prior to the procedure, a History and Physical was                         performed, and patient medications, allergies and                         sensitivities were reviewed. The patient's tolerance                         of previous anesthesia was reviewed.                        - The risks and benefits of the procedure and the                         sedation options and risks were discussed with the                         patient. All questions were answered and informed                         consent was obtained.                        - ASA Grade Assessment: II - A patient with mild                         systemic disease.                        After obtaining informed consent, the colonoscope was                         passed under direct vision. Throughout the procedure,                         the patient's blood pressure, pulse, and oxygen                         saturations were monitored continuously. The                         Colonoscope was introduced through  the anus and                         advanced to the the cecum, identified by the                         appendiceal orifice. The colonoscopy was performed                         with ease. The patient tolerated the procedure well.                          The quality of the bowel preparation was good. Findings:      The perianal and digital rectal examinations were normal.      Multiple small-mouthed diverticula were found in the left colon.      Two sessile polyps were found in the ascending colon. The polyps were 5       to 8 mm in size. These polyps were removed with a cold snare. Resection       and retrieval were complete.      A 5 mm polyp was found in the sigmoid colon. The polyp was sessile. The       polyp was removed with a cold snare. Resection and retrieval were       complete.      The exam was otherwise without abnormality. Impression:            - Diverticulosis in the left colon.                        - Two 5 to 8 mm polyps in the ascending colon, removed                         with a cold snare. Resected and retrieved.                        - One 5 mm polyp in the sigmoid colon, removed with a                         cold snare. Resected and retrieved.                        - The examination was otherwise normal. Recommendation:        - Discharge patient to home (with escort).                        - Resume previous diet.                        - Continue present medications.                        - Await pathology results.                        - Repeat colonoscopy for surveillance based on                         pathology results. Procedure Code(s):     --- Professional ---  45385, Colonoscopy, flexible; with removal of                         tumor(s), polyp(s), or other lesion(s) by snare                         technique Diagnosis Code(s):     --- Professional ---                        K63.5, Polyp of colon                        K57.32, Diverticulitis of large intestine without                         perforation or abscess without bleeding                        K57.30, Diverticulosis of large intestine without                         perforation or abscess without  bleeding CPT copyright 2019 American Medical Association. All rights reserved. The codes documented in this report are preliminary and upon coder review may  be revised to meet current compliance requirements. [name], MD [name] MD, MD 06/04/2021 9:00:07 AM This report has been signed electronically. Number of Addenda: 0 Note Initiated On: 06/04/2021 8:20 AM Scope Withdrawal Time: 0 hours 13 minutes 4 seconds  Total Procedure Duration: 0 hours 14 minutes 35 seconds  Estimated Blood Loss:  Estimated blood loss: none.      [facility]

## 2021-06-04 NOTE — Transfer of Care (Signed)
Immediate Anesthesia Transfer of Care Note  Patient: Whitney [name]  Procedure(s) Performed: COLONOSCOPY WITH PROPOFOL ESOPHAGOGASTRODUODENOSCOPY (EGD) WITH PROPOFOL  Patient Location: Endoscopy Unit  Anesthesia Type:General  Level of Consciousness: drowsy  Airway & Oxygen Therapy: Patient Spontanous Breathing and Patient connected to face mask oxygen  Post-op Assessment: Report given to RN and Post -op Vital signs reviewed and stable  Post vital signs: Reviewed and stable  Last Vitals:  Vitals Value Taken Time  BP 112/69 06/04/21 0902  Temp    Pulse 89 06/04/21 0902  Resp 18 06/04/21 0902  SpO2 97 % 06/04/21 0902  Vitals shown include unvalidated device data.  Last Pain:  Vitals:   06/04/21 0726  TempSrc: Temporal  PainSc: 0-No pain         Complications: No notable events documented.

## 2021-06-04 NOTE — Anesthesia Postprocedure Evaluation (Signed)
Anesthesia Post Note  Patient: MOLLYE GUINTA  Procedure(s) Performed: COLONOSCOPY WITH PROPOFOL ESOPHAGOGASTRODUODENOSCOPY (EGD) WITH PROPOFOL  Patient location during evaluation: PACU Anesthesia Type: General Level of consciousness: awake and alert, oriented and patient cooperative Pain management: pain level controlled Vital Signs Assessment: post-procedure vital signs reviewed and stable Respiratory status: spontaneous breathing, nonlabored ventilation and respiratory function stable Cardiovascular status: blood pressure returned to baseline and stable Postop Assessment: adequate PO intake Anesthetic complications: no   No notable events documented.   Last Vitals:  Vitals:   06/04/21 0910 06/04/21 0920  BP: (!) 128/98 (!) 152/97  Pulse: 80 72  Resp: (!) 25 (!) 24  Temp:    SpO2: 100% 100%    Last Pain:  Vitals:   06/04/21 0900  TempSrc: Temporal  PainSc:                  Darrin Nipper

## 2021-06-04 NOTE — Anesthesia Preprocedure Evaluation (Signed)
Anesthesia Evaluation  Patient identified by MRN, date of birth, ID band Patient awake    Reviewed: Allergy & Precautions, NPO status , Patient's Chart, lab work & pertinent test results  History of Anesthesia Complications Negative for: history of anesthetic complications  Airway Mallampati: IV   Neck ROM: Full    Dental  (+) Upper Dentures, Partial Lower   Pulmonary sleep apnea ,    Pulmonary exam normal breath sounds clear to auscultation       Cardiovascular hypertension, + CAD  Normal cardiovascular exam+ dysrhythmias (a fib)  Rhythm:Regular Rate:Normal  Bradycardia   Stress echo 11/09/18: normal   Neuro/Psych PSYCHIATRIC DISORDERS Anxiety Depression negative neurological ROS     GI/Hepatic GERD  ,  Endo/Other  Class 3 obesity  Renal/GU Renal disease (s/p nephrectomy)     Musculoskeletal   Abdominal   Peds  Hematology negative hematology ROS (+)   Anesthesia Other Findings Reviewed cardiology note 12/11/20  Reproductive/Obstetrics                             Anesthesia Physical Anesthesia Plan  ASA: 3  Anesthesia Plan: General   Post-op Pain Management:    Induction: Intravenous  PONV Risk Score and Plan: 3 and Propofol infusion, TIVA and Treatment may vary due to age or medical condition  Airway Management Planned: Natural Airway  Additional Equipment:   Intra-op Plan:   Post-operative Plan:   Informed Consent: I have reviewed the patients History and Physical, chart, labs and discussed the procedure including the risks, benefits and alternatives for the proposed anesthesia with the patient or authorized representative who has indicated his/her understanding and acceptance.       Plan Discussed with: CRNA  Anesthesia Plan Comments:         Anesthesia Quick Evaluation

## 2021-06-04 NOTE — Op Note (Signed)
[facility] Gastroenterology Patient Name: Whitney [name] Procedure Date: 06/04/2021 8:21 AM MRN: [id number] Account #: [id number] Date of Birth: November 14, 1950 Admit Type: Outpatient Age: 70 Room: [facility] ENDO ROOM 2 Gender: Female Note Status: Finalized Instrument Name: Upper Endoscope [id number] Procedure:             Upper GI endoscopy Indications:           Follow-up of intestinal metaplasia Providers:             [name] MD, MD Referring MD:          Lavera Guise, MD (Referring MD) Medicines:             Monitored Anesthesia Care Complications:         No immediate complications. Procedure:             Pre-Anesthesia Assessment:                        - Prior to the procedure, a History and Physical was                         performed, and patient medications, allergies and                         sensitivities were reviewed. The patient's tolerance                         of previous anesthesia was reviewed.                        - The risks and benefits of the procedure and the                         sedation options and risks were discussed with the                         patient. All questions were answered and informed                         consent was obtained.                        - ASA Grade Assessment: II - A patient with mild                         systemic disease.                        After obtaining informed consent, the endoscope was                         passed under direct vision. Throughout the procedure,                         the patient's blood pressure, pulse, and oxygen                         saturations were monitored continuously. The Endoscope  was introduced through the mouth, and advanced to the                         third part of duodenum. The upper GI endoscopy was                         accomplished with ease. The patient tolerated the                         procedure well. Findings:       The esophagus was normal.      The examined duodenum was normal.      Diffuse moderate inflammation characterized by congestion (edema) and       erythema was found in the entire examined stomach. Biopsies were taken       with a cold forceps for histology.      The cardia and gastric fundus were normal on retroflexion. Impression:            - Normal esophagus.                        - Normal examined duodenum.                        - Gastritis. Biopsied. Recommendation:        - Await pathology results.                        - Perform a colonoscopy today. Procedure Code(s):     --- Professional ---                        [phone], Esophagogastroduodenoscopy, flexible,                         transoral; with biopsy, single or multiple Diagnosis Code(s):     --- Professional ---                        K29.70, Gastritis, unspecified, without bleeding                        K31.89, Other diseases of stomach and duodenum CPT copyright 2019 American Medical Association. All rights reserved. The codes documented in this report are preliminary and upon coder review may  be revised to meet current compliance requirements. [name], MD [name] MD, MD 06/04/2021 8:41:43 AM This report has been signed electronically. Number of Addenda: 0 Note Initiated On: 06/04/2021 8:21 AM Estimated Blood Loss:  Estimated blood loss: none.      [facility]

## 2021-06-04 NOTE — Anesthesia Procedure Notes (Signed)
Date/Time: 06/04/2021 8:37 AM Performed by: Lily Peer, Junko Ohagan, CRNA Pre-anesthesia Checklist: Patient identified, Emergency Drugs available, Patient being monitored, Timeout performed and Suction available Patient Re-evaluated:Patient Re-evaluated prior to induction Oxygen Delivery Method: Simple face mask Induction Type: IV induction

## 2021-06-06 LAB — SURGICAL PATHOLOGY

## 2021-06-10 ENCOUNTER — Encounter: Payer: Medicare Other | Admitting: Physical Therapy

## 2021-06-13 ENCOUNTER — Telehealth: Payer: Self-pay | Admitting: Student-PharmD

## 2021-06-13 NOTE — Progress Notes (Addendum)
  Chronic Care Management Pharmacy Assistant   Name: Whitney [name]  MRN: [id number] DOB: [birth year]  Reason for Encounter: General Disease State Call   Conditions to be addressed/monitored: HTN, Afib, Osteoarthritis, HLD, Insomnia, RLS  Recent office visits:  None since last CCM call 04/09/21  Recent consult visits:  04/30/21 Joellyn Rued, MD. For follow-up. Na Sulfate-K Sulfate-Mg Sulf 17.5-3.13-1.6 GM/177ML SOLN.  Hospital visits:  06/04/21 [facility] (3 Hours) [name], MD. For colonoscopy.   Medications: Outpatient Encounter Medications as of 06/13/2021  Medication Sig   albuterol (VENTOLIN HFA) 108 (90 Base) MCG/ACT inhaler Inhale 2 puffs into the lungs every 4 (four) hours as needed for wheezing or shortness of breath.   amLODipine (NORVASC) 5 MG tablet TAKE 1 TABLET BY MOUTH TWICE DAILY   aspirin 81 MG chewable tablet Chew 1 tablet (81 mg total) by mouth 2 (two) times daily.   carvedilol (COREG) 6.25 MG tablet Take 1 tablet (6.25 mg total) by mouth 2 (two) times daily with a meal.   Cholecalciferol (VITAMIN D) 50 MCG (2000 UT) tablet Take 2,000 Units by mouth in the morning and at bedtime.    diclofenac Sodium (VOLTAREN) 1 % GEL Apply 2 g topically daily as needed (Knee pain).   docusate sodium (COLACE) 100 MG capsule Take 1 capsule (100 mg total) by mouth 2 (two) times daily. (Patient not taking: Reported on 06/04/2021)   ezetimibe (ZETIA) 10 MG tablet Take 1 tablet (10 mg total) by mouth daily. (Patient taking differently: Take 10 mg by mouth every morning.)   flecainide (TAMBOCOR) 50 MG tablet Take 50 mg by mouth as needed. (Patient not taking: Reported on 06/04/2021)   gabapentin (NEURONTIN) 100 MG capsule Take 200 mg by mouth 2 (two) times daily.   hydrALAZINE (APRESOLINE) 25 MG tablet Take 1 tablet (25 mg total) by mouth 3 (three) times daily. For HTN   HYDROcodone-acetaminophen (NORCO) 7.5-325 MG tablet Take 1 tablet by mouth every 4 (four)  hours as needed for moderate pain (pain score 7-10).   methocarbamol (ROBAXIN) 500 MG tablet Take 1 tablet (500 mg total) by mouth 4 (four) times daily.   orlistat (ALLI) 60 MG capsule Take 60 mg by mouth 3 (three) times daily with meals.   rOPINIRole (REQUIP) 0.5 MG tablet Take 0.5-1 mg by mouth 2 (two) times daily as needed (pain).   No facility-administered encounter medications on file as of 06/13/2021.   GEN CALL: Spoke with the patient and her daughter. She stated she is doing very well. Today was her birthday and she stated she is eating well and active on a daily basics. She stated her colonoscopy went well. She states she did not have any questions or concerns about her medications at this time. She stated she likes how she gets her medication and doesn't not want to change it any time soon. We dicussed her follow-up appointments and I informed her of the new pharmacist Alena Bills.   Care Gaps:Not on the list.  Star Rating Drugs:N/A.  Follow-Up:Pharmacist Review  Charlann Lange, RMA Clinical Pharmacist Assistant [phone]  5 minutes spent in review, coordination, and documentation.  Reviewed by: Alena Bills, PharmD Clinical Pharmacist [phone]

## 2021-06-18 ENCOUNTER — Ambulatory Visit: Payer: Medicare Other | Admitting: Physical Therapy

## 2021-06-20 ENCOUNTER — Ambulatory Visit: Payer: Medicare Other | Attending: Orthopedic Surgery | Admitting: Physical Therapy

## 2021-06-20 ENCOUNTER — Encounter: Payer: Self-pay | Admitting: Physical Therapy

## 2021-06-20 DIAGNOSIS — M25561 Pain in right knee: Secondary | ICD-10-CM | POA: Insufficient documentation

## 2021-06-20 DIAGNOSIS — M6281 Muscle weakness (generalized): Secondary | ICD-10-CM | POA: Insufficient documentation

## 2021-06-20 DIAGNOSIS — M25661 Stiffness of right knee, not elsewhere classified: Secondary | ICD-10-CM | POA: Insufficient documentation

## 2021-06-20 DIAGNOSIS — R262 Difficulty in walking, not elsewhere classified: Secondary | ICD-10-CM | POA: Insufficient documentation

## 2021-06-20 NOTE — Therapy (Signed)
[facility] [facility] [address], [place], [address] Phone: [phone]   Fax:  [phone]  Patient Details  Name: Whitney [name] MRN: [id number] Date of Birth: 11-30-1950 Referring Provider:  Lovell Sheehan, MD  Encounter Date: 06/20/2021   Upon start of visit patient found to have active upper respiratory symptoms including headache, chills, stuffy nose, stomach and back ache, and feels unwell. Patient was therefore not seen for PT today. She states she came anyway even though because she states her transport service said it was too late to cancel and they would not pick her up at her next scheduled date if she canceled today.   Everlean Alstrom. Graylon Good, PT, DPT 06/20/21, 1:11 PM   [place] [facility] [address], [place], [address] Phone: [phone]   Fax:  [phone]

## 2021-06-24 ENCOUNTER — Ambulatory Visit: Payer: Medicare Other | Admitting: Physical Therapy

## 2021-06-25 ENCOUNTER — Ambulatory Visit: Payer: Medicare Other | Admitting: Nurse Practitioner

## 2021-06-26 ENCOUNTER — Encounter: Payer: Medicare Other | Admitting: Physical Therapy

## 2021-07-01 ENCOUNTER — Telehealth: Payer: Self-pay

## 2021-07-01 ENCOUNTER — Telehealth: Payer: Self-pay | Admitting: Physical Therapy

## 2021-07-01 ENCOUNTER — Encounter: Payer: Self-pay | Admitting: Physical Therapy

## 2021-07-01 ENCOUNTER — Ambulatory Visit: Payer: Medicare Other | Admitting: Physical Therapy

## 2021-07-01 VITALS — BP 165/100 | HR 80

## 2021-07-01 DIAGNOSIS — M25661 Stiffness of right knee, not elsewhere classified: Secondary | ICD-10-CM

## 2021-07-01 DIAGNOSIS — M25561 Pain in right knee: Secondary | ICD-10-CM

## 2021-07-01 DIAGNOSIS — R262 Difficulty in walking, not elsewhere classified: Secondary | ICD-10-CM | POA: Diagnosis not present

## 2021-07-01 DIAGNOSIS — M6281 Muscle weakness (generalized): Secondary | ICD-10-CM | POA: Diagnosis not present

## 2021-07-01 NOTE — Telephone Encounter (Signed)
Desha, medical assistant, called back from Dr. Laurelyn Sickle office. She reviewed the patient's symptoms and concerns of PT. Stated she felt that the patient should be seen by Cardiology. Said she would discuss with provider and contact patient with advice.   Everlean Alstrom. Graylon Good, PT, DPT 07/01/21, 3:26 PM

## 2021-07-01 NOTE — Telephone Encounter (Signed)
Lvm for patient to call office to schedule acute visit for this week per Desha-Toni

## 2021-07-01 NOTE — Telephone Encounter (Signed)
ERROR

## 2021-07-01 NOTE — Therapy (Signed)
[facility] [address], [place], [address] Phone: [phone]   Fax:  [phone]  Physical Therapy Treatment / Progress Note / Re-Certification Dates of reporting 05/08/2021 to 07/01/2021  Patient Details  Name: Whitney [name] MRN: [id number] Date of Birth: [birth year] Referring Provider (PT): Kurtis Bushman, MD.   Encounter Date: 07/01/2021   PT End of Session - 07/01/21 1304     Visit Number 15    Number of Visits 24    Date for PT Re-Evaluation 06/11/21    Authorization Type [employer] MEDICARE (Referring periord from 03/19/2021)    Progress Note Due on Visit 10    PT Start Time 1300    PT Stop Time 1340    PT Time Calculation (min) 40 min    Equipment Utilized During Treatment Gait belt;Other (comment)   SPC   Activity Tolerance Patient tolerated treatment well    Behavior During Therapy WFL for tasks assessed/performed             Past Medical History:  Diagnosis Date   Abnormal Pap smear of cervix    Pt states she had colposcopy    Anxiety    Aortic atherosclerosis (HCC)    Arthritis    CAD (coronary artery disease)    Chronic kidney disease    s/p LEFT nephrectomy   Chronic lower back pain    COVID    Depression    Diverticulitis    Dyspnea    History of methicillin resistant staphylococcus aureus (MRSA) 12/21/2020   nasal swab pcr   Hx of unilateral nephrectomy 1979   Left Nephrectomy   Hypertension    Lower extremity edema    PAF (paroxysmal atrial fibrillation) (HCC)    Palpitations    PMB (postmenopausal bleeding)    Restless leg syndrome    Sleep apnea    has no cpap   Symptomatic bradycardia    Vitamin D deficiency     Past Surgical History:  Procedure Laterality Date   BIOPSY  10/31/2019   Procedure: BIOPSY;  Surgeon: Irving Copas., MD;  Location: [place];  Service: Gastroenterology;;   CARDIAC CATHETERIZATION     CHOLECYSTECTOMY     COLONOSCOPY WITH  PROPOFOL N/A 06/13/2019   Procedure: COLONOSCOPY WITH PROPOFOL;  Surgeon: [name], MD;  Location: [facility] ENDOSCOPY;  Service: Gastroenterology;  Laterality: N/A;   COLONOSCOPY WITH PROPOFOL N/A 08/12/2019   Procedure: COLONOSCOPY WITH PROPOFOL;  Surgeon: [name], MD;  Location: [facility] ENDOSCOPY;  Service: Gastroenterology;  Laterality: N/A;   COLONOSCOPY WITH PROPOFOL N/A 09/19/2019   Procedure: COLONOSCOPY WITH PROPOFOL;  Surgeon: [name], MD;  Location: [facility] ENDOSCOPY;  Service: Gastroenterology;  Laterality: N/A;   COLONOSCOPY WITH PROPOFOL N/A 06/04/2021   Procedure: COLONOSCOPY WITH PROPOFOL;  Surgeon: [name], MD;  Location: [facility] ENDOSCOPY;  Service: Gastroenterology;  Laterality: N/A;   DILATION AND CURETTAGE OF UTERUS     ESOPHAGOGASTRODUODENOSCOPY (EGD) WITH PROPOFOL N/A 06/13/2019   Procedure: ESOPHAGOGASTRODUODENOSCOPY (EGD) WITH PROPOFOL;  Surgeon: [name], MD;  Location: [facility] ENDOSCOPY;  Service: Gastroenterology;  Laterality: N/A;  Pt will go for COVID test on 59-9-35 as she uses public transportation and will be in the area on that day.    ESOPHAGOGASTRODUODENOSCOPY (EGD) WITH PROPOFOL N/A 10/31/2019   Procedure: ESOPHAGOGASTRODUODENOSCOPY (EGD) WITH PROPOFOL;  Surgeon: Rush Landmark Telford Nab., MD;  Location: [place];  Service: Gastroenterology;  Laterality: N/A;   ESOPHAGOGASTRODUODENOSCOPY (EGD) WITH PROPOFOL N/A 06/04/2021   Procedure:  ESOPHAGOGASTRODUODENOSCOPY (EGD) WITH PROPOFOL;  Surgeon: [name], MD;  Location: [facility] ENDOSCOPY;  Service: Gastroenterology;  Laterality: N/A;   HEMOSTASIS CLIP PLACEMENT  10/31/2019   Procedure: HEMOSTASIS CLIP PLACEMENT;  Surgeon: Irving Copas., MD;  Location: [place];  Service: Gastroenterology;;   HERNIA REPAIR     HYSTEROSCOPY WITH D & C N/A 02/16/2017   Procedure: DILATATION AND CURETTAGE /HYSTEROSCOPY;  Surgeon: Brayton Mars, MD;  Location: [facility] ORS;  Service: Gynecology;  Laterality: N/A;   JOINT  REPLACEMENT Left    knee   kidney removal Left    KIDNEY SURGERY Left 1979   left kidney removed   LEFT HEART CATH AND CORONARY ANGIOGRAPHY N/A 10/21/2017   Procedure: LEFT HEART CATH AND CORONARY ANGIOGRAPHY;  Surgeon: Teodoro Spray, MD;  Location: [place] CV [facility];  Service: Cardiovascular;  Laterality: N/A;   POLYPECTOMY  10/31/2019   Procedure: POLYPECTOMY;  Surgeon: Rush Landmark Telford Nab., MD;  Location: [place];  Service: Gastroenterology;;   TOTAL KNEE ARTHROPLASTY Left 07/06/2017   Procedure: TOTAL KNEE ARTHROPLASTY;  Surgeon: Lovell Sheehan, MD;  Location: [facility];  Service: Orthopedics;  Laterality: Left;   TOTAL KNEE ARTHROPLASTY Right 02/25/2021   Procedure: TOTAL KNEE ARTHROPLASTY;  Surgeon: Lovell Sheehan, MD;  Location: [facility];  Service: Orthopedics;  Laterality: Right;   UPPER ESOPHAGEAL ENDOSCOPIC ULTRASOUND (EUS) N/A 10/31/2019   Procedure: UPPER ESOPHAGEAL ENDOSCOPIC ULTRASOUND (EUS);  Surgeon: Irving Copas., MD;  Location: [place];  Service: Gastroenterology;  Laterality: N/A;   WRIST ARTHROSCOPY Left 1970s   XI ROBOTIC ASSISTED VENTRAL HERNIA N/A 12/01/2019   Procedure: XI ROBOTIC ASSISTED VENTRAL HERNIA;  Surgeon: Jules Husbands, MD;  Location: [facility] ORS;  Service: General;  Laterality: N/A;    Vitals:   07/01/21 1308 07/01/21 1407  BP: (!) 162/91 (!) 165/100  Pulse: 77 80  SpO2: 99% 100%     Subjective Assessment - 07/01/21 1301     Subjective Patient arrives carrying SPC. She was sick and her BP was up but she is feeling better now. She has a follow up with her MD on 12/6 due to a cramp and pain that happens at the left side of her head. Patient states she has been doing what she can at home while she was sick. She has not been walking much and it has not been buckling. She states it doesn't hurt much. Starting the first of the year she has a ride to go back and forth to [employer] and she plans to go 3x a week. Patient is still  limping and states they say one of her legs is shorter than the other. She feels it a lot more. She is considering getting a lift in her shoe. She states she didn't feel it before she had the knee replacement. She thinks the right one is shorter. She states she didn't have a dizzy spell for a while but the past week they have come back. She is planning to discuss it with her Cardiologist. She gets a sick feeling in her stomach and feels like she is going to pass out. She states she has meclezine to take.    Pertinent History Patient is a 70 year old female who presents to outpatient physical therapy with a referral s/p R TKA (02/25/2021). This patient's chief complaints consist of R knee and leg pain and limited ROM, leading to the following functional deficits: difficulty with walking, standing, standing from sitting, going up/down stairs, bed mobility,  getting in the shower/tub, getting out of performing ADLs such as sweeping, cooking, cleaning, doing dishes. Relevant past medical history and comorbidities include: asthma, sleep apnea, HTN, CAD, Afib, Anxiety, depression, GERD, renal insufficiency, arthritis, and CKD, L TKA, obesity. Patient denies, history of cancer, stroke, seizure, major lung or heart problems, unexplained weight loss, numbness in groin, bowel or bladder changes.    Limitations Standing;Walking;House hold activities;Other (comment);Lifting;Sitting   Difficulty with walking, standing, standing from sitting, going up/down stairs, bed mobility, getting in the shower/tub, getting out of performing ADLs such as sweeping, cooking, cleaning, doing dishes   How long can you walk comfortably? 2 minutes    Patient Stated Goals "walk without a walker and without a cane"    Currently in Pain? No/denies    Pain Onset Other (comment)   Surgical pain onset 02/25/2021. Radiating R leg pain occured prior to surgery   Effect of Pain on Daily Activities Everything has gotten better but still has some trouble  with walking, standing, going up/down stairs, sweeping, cooking, sweeping, doing dishes. standing up from sitting, bed mobility, getting in the shower/tub. Her knee still buckles sometimes and it is hard for her to go up and down stairs. She carries her cane with her a lot.              OBJECTIVE   VITALS All BP taken in seated position at left wrist passively supported on table at heart level.  At rest prior to exercise:  BP 162/91 mmHg, HR 77 bpm. Automatic adult cuff, left wrist, SpO2 99% At rest after end of testing:  BP 165/100 mmHg, HR 80 bpm. Automatic adult cuff, left wrist, SpO2 100%  SELF-REPORTED FUNCTION FOTO score: 72/100 (knee questionnaire)   LE PERIPHERAL JOINT MOTION (PROM in degrees)   03/19/2021 05/08/21 07/01/21  Joint/Motion R/L R/L R/L  Knee        Flexion 93*/   112*/112*  109*/  Extension - 11*/   +5/0 0/   *Indicates pain Comments:  03/19/21: empty end feel noted with R knee flexion. Patient reports primary pain through R anterior leg and hip and mild pain on the front of her knee.  07/01/21: pain at anterior R knee with flexion   LE MUSCLE PERFORMANCE (MMT): *Indicates pain 03/19/21 05/08/21 07/01/21  Joint/Motion R/L R/L R/L  Hip        Flexion 4/4      Knee        Extension 3+*/ 4+/5  5 /5  Flexion 3+*/  4*/4  4+*/5   *denotes pain.      FUNCTIONAL TESTS 6 Minute Walk Test: 1093 feet with SPC in R UE and supervision for safety. HR up to 114 bpm and pt reports palpitations upon completion of test. SpO2 98%. 5 Time Sit To Stand test: 13.73 seconds from 18.5 inch plinth with no UE support. (Feels dizzy afterwards).        TREATMENT:  Therapeutic exercise: to centralize symptoms and improve ROM, strength, muscular endurance, and activity tolerance required for successful completion of functional activities.  - vitals to assess safety prior to exercise (see above).  - 6 Minute Walk Test - see above - ambulation ~50 feet with SPC and supervision.  Felt dizzy spell and pain at left side of head that improved with seated rest.  - sit <> supine to test knee ROM (slight nausea, more pronounced dizziness and blurry when she came supine to sit).  - ROM and MMT (see above).  -  vitals re-check (see above). - ambulation ~ 100 feet from clinic to transport service with SPC and SBA. Pt again states she feels "eerie" feeling, better once she sat down.    Pt required multimodal cuing for proper technique and to facilitate improved neuromuscular control, strength, range of motion, and functional ability resulting in improved performance and form.      HOME EXERCISE PLAN Access Code: [email or web address] URL: [email or web address] Date: 05/29/2021 Prepared by: Rosita Kea   Exercises Staggered Sit-to-Stand - 1 x daily - 3 sets - 10 reps Diagonal Hip Extension with Resistance - 1 x daily - 3 sets - 10 reps - 1 second hold Seated Knee Flexion Stretch - 3 sets - 30 seconds. hold      PT Education - 07/01/21 1304     Education Details exercise purpose/form.    Person(s) Educated Patient    Methods Explanation;Demonstration;Tactile cues;Verbal cues    Comprehension Verbalized understanding;Returned demonstration;Verbal cues required;Tactile cues required;Need further instruction              PT Short Term Goals - 04/10/21 1447       PT SHORT TERM GOAL #1   Title Patient will be independent with inital home exercise program to improve strength/mobility and functional independence with ADLs.    Baseline Initial HEP provided at eval - more formal HEP to be provided at second visit.    Time 3    Period Weeks    Status Achieved    Target Date 04/09/21               PT Long Term Goals - 07/01/21 1412       PT LONG TERM GOAL #1   Title Patient will be independent with long term home exercise program to improve strength/mobility and functional independence with ADLs.    Baseline Initial HEP provided at eval - more formal HEP to  be provided at second visit (03/19/2021); currently participating in appropriate HEP (04/24/2021); participating as able - has been sick (07/01/2021);   TARGET DATE FOR ALL LONG TERM GOALS: 06/11/2021   Time 12    Period Weeks    Status Partially Met   TARGET DATE FOR ALL LONG TERM GOALS: 06/11/2021. TARGET DATE UPDATED TO 09/23/2021 FOR ALL UNMET GOALS  ON 07/01/2021     PT LONG TERM GOAL #2   Title Demonstrate improved FOTO score by 10 units or greater to demonstrate improvement in overall condition and self-reported functional ability.    Baseline 58 (03/19/2021); 57 (05/08/2021); 72 (07/01/2021);    Time 12    Period Weeks    Status On-going      PT LONG TERM GOAL #3   Title Pt will increase R knee extension/flexion strength to at least a 5/5 MMT grade without pain in order to demonstrate improvement in strength and function    Baseline 3+ strength limited by pain (03/19/2021); 5/5 (07/01/2021);    Status Achieved      PT LONG TERM GOAL #4   Title Patient will ambulate equal or greater than 1000 feet on 6 Minute Walk Test with no AD to demonstrate improved community and household mobility.    Baseline Patient unable to walk greater than 2 minutes (03/19/2021); ambulated 1000 feet with RW and CGA. L leg buckled one time per patient report. (04/24/2021); 1247 feet with SPC in R UE and supervision for safety. HR up to 114 bpm and pt reports palpitations upon completion of test. SpO2 98%, no buckling (07/01/2021);  Time 12    Period Weeks    Status Partially Met      PT LONG TERM GOAL #5   Title Patient will improve R knee flexion to 120 degrees or greater to demonstrate improvements in mobility and community ambulation without compensation.    Baseline 93 degrees (03/19/2021); 109 degrees (07/01/2021);    Time 12    Period Weeks    Status Partially Met      PT LONG TERM GOAL #6   Title Patient will improve R knee extension to 0 degrees or greater to demonstrate improvements in mobility and  community ambulation without compensation.    Baseline -11 degrees (03/19/2021): 0 degrees (07/01/2021);    Time 12    Period Weeks    Status Achieved      PT LONG TERM GOAL #7   Title Patient will reduce 5 Time Sit To Stand to <15 seconds to reduce fall risk and demonstrate improved transfer/gait ability.    Baseline 16 seconds without UE support (04/24/2021); 13.73 seconds from 18.5 inch plinth with no UE support. (Feels dizzy afterwards, 07/01/2021);    Time 12    Period Weeks    Status Partially Met                   Plan - 07/01/21 1423     Clinical Impression Statement Patient has attended 15 physical therapy sessions this episode of care. Her plan of care has been interrupted by issues with transportation and comorbid conditions. Most recently she was sick with abdominal and upper respiratory symptoms that prevent her from attending since 05/29/2021. She returns today but was having episodes of feeling unwell after exertion that include the following: "eerie" feeling, nausea, left head pain, dizziness including some spinning and lightheaded/blurriness. These symptoms were not explained by low blood pressure (which was high today as usual), and are similar to how she has described symptom after exercise at past visit. These symptoms are concerning and recommended pt discuss with PCP before continuing PT as physical exertion required for PT may be unsafe given these symptoms. PCP  office contacted by PT to report symptoms and concerns (left VM on nurse line).  Patient has been limited in her HEP participation recently due to illness. She shows significant improvement in self-reported function (FOTO from 58 to 72 today), and MMT demonstrate good quad strength. However, last month when she was here she was experiencing sudden bulking of the R LE and had difficulty performing multiple reps of knee extension on that side compared to the L LE due to apparent quad weakness. No buckling today  but was not able to use the knee extension machine due to constitutional symptoms. Patient's PROM remains similar with good extension and limited flexion to ~110 degrees. Patient reports continuing to have difficulty with ambulation due to R knee buckling or fear of R knee buckling without AD and difficulty climbing stairs. When cleared medically, patient would benefit from continued management of limiting condition by skilled physical therapist to address remaining impairments and functional limitations to work towards stated goals and return to PLOF or maximal functional independence.    Personal Factors and Comorbidities Age;Comorbidity 3+;Past/Current Experience    Comorbidities asthma, sleep apnea, HTN, CAD, Afib, Anxiety, depression, GERD, renal insufficiency, arthritis, and CKD, L TKA, obesity    Examination-Activity Limitations Bathing;Sit;Stand;Bed Mobility;Locomotion Level;Bend;Dressing;Squat;Transfers;Hygiene/Grooming;Stairs    Examination-Participation Restrictions Cleaning;Yard Work;Community Activity;Laundry    Stability/Clinical Decision Making Stable/Uncomplicated    Rehab Potential Good  PT Frequency 2x / week    PT Duration 12 weeks    PT Treatment/Interventions ADLs/Self Care Home Management;Cryotherapy;Electrical Stimulation;Moist Heat;Gait training;Functional mobility training;Stair training;Balance training;Therapeutic exercise;Therapeutic activities;Neuromuscular re-education;Patient/family education;Manual techniques;Scar mobilization;Passive range of motion;Joint Manipulations;Dry needling    PT Next Visit Plan Progress LE strength and mobility as tolerated once cleared medically.    PT Home Exercise Plan Medbridge Access Code: [email or web address]    Consulted and Agree with Plan of Care Patient             Patient will benefit from skilled therapeutic intervention in order to improve the following deficits and impairments:  Abnormal gait, Decreased activity tolerance, Decreased  endurance, Decreased range of motion, Decreased skin integrity, Decreased strength, Hypomobility, Impaired perceived functional ability, Impaired sensation, Improper body mechanics, Pain, Decreased balance, Decreased mobility, Difficulty walking, Increased edema, Impaired flexibility, Obesity  Visit Diagnosis: Acute pain of right knee  Muscle weakness (generalized)  Stiffness of right knee, not elsewhere classified  Difficulty in walking, not elsewhere classified     Problem List Patient Active Problem List   Diagnosis Date Noted   History of total knee arthroplasty, right 02/25/2021   Intestinal metaplasia of gastric cardia 11/07/2020   Mild intermittent asthma without complication 68/07/7516   Shortness of breath 08/08/2020   Vaginal candidiasis 08/08/2020   Atherosclerosis of aorta (HCC) 07/09/2020   Acute diverticulitis 05/13/2020   Fever blister 10/15/2019   Cerumen in auditory canal on examination 10/15/2019   Bilateral hearing loss 10/15/2019   Need for vaccination against Streptococcus pneumoniae using pneumococcal conjugate vaccine 7 10/15/2019   Encounter for screening mammogram for malignant neoplasm of breast 10/15/2019   Colon cancer screening 10/15/2019   Weakness 09/02/2019   Headache disorder 09/02/2019   Stage 3b chronic kidney disease ([place]) 08/10/2019   Elevated erythrocyte sedimentation rate 11/02/2018   Lumbar spondylosis 11/02/2018   PVC's (premature ventricular contractions) 10/18/2018   Primary osteoarthritis of right knee 09/15/2018   Encounter for pre-operative examination 09/15/2018   Obstructive sleep apnea 09/15/2018   Calculus of gallbladder without cholecystitis without obstruction 09/15/2018   Calculus of gallbladder with cholecystitis without biliary obstruction 01/27/2018   Restless leg syndrome 01/27/2018   Generalized abdominal pain 01/11/2018   Cyst of pancreas 01/11/2018   Other specified disorders of kidney and ureter 01/11/2018    Primary insomnia 01/11/2018   Paroxysmal atrial fibrillation ([place]) 11/12/2017   Essential hypertension 10/25/2017   Mixed hyperlipidemia 10/25/2017   Low back pain at multiple sites 10/25/2017   Dysuria 10/25/2017   Encounter for general adult medical examination with abnormal findings 10/25/2017   Vitamin D deficiency 10/25/2017   Near syncope    Symptomatic bradycardia 10/19/2017   Carpal tunnel syndrome 10/12/2017   Primary osteoarthritis of left knee 07/09/2017   History of total knee arthroplasty 07/06/2017   Chest pain 06/29/2017   Postop check 02/25/2017   Impingement syndrome of shoulder region 01/20/2017   Neck pain 01/20/2017   Obesity (BMI 35.0-39.9 without comorbidity) 07/15/2016   Endometrial polyp 07/15/2016   Asymptomatic postmenopausal state 07/15/2016   Morbid obesity ([place]) 07/15/2016   Family history of breast cancer in first degree relative 07/15/2016   Family history of ovarian cancer 07/15/2016   Knee pain 07/04/2016   Bilateral leg pain 06/24/2016    Everlean Alstrom. Graylon Good, PT, DPT 07/01/21, 2:25 PM   [place] [address] [place], [address] Phone: [phone]   Fax:  [phone]  Name: Whitney  LUCIANNE [name] MRN: [id number] Date of Birth: [birth year]

## 2021-07-01 NOTE — Telephone Encounter (Signed)
Dorcas Carrow PT [phone]) with [facility] called and advised pt was seen for PT and was very concerned tha pt was having dizziness with spinning feeling, lightheaded with blurred vision, nausea with erry feeling of passing out, pain around left side of head and down to neck, also palpitations.  Gets better with resting.  BP was 162/91 at beginning of visit and at end was 165/100.  Spoke to [place] and she would like for pt to be seen in office this week and then maybe refer her to cardiology.  Had Vivien Rota call pt and get her scheduled for this week.

## 2021-07-03 ENCOUNTER — Ambulatory Visit: Payer: Medicare Other | Admitting: Physical Therapy

## 2021-07-03 DIAGNOSIS — E782 Mixed hyperlipidemia: Secondary | ICD-10-CM | POA: Diagnosis not present

## 2021-07-03 DIAGNOSIS — M1712 Unilateral primary osteoarthritis, left knee: Secondary | ICD-10-CM | POA: Diagnosis not present

## 2021-07-03 DIAGNOSIS — I10 Essential (primary) hypertension: Secondary | ICD-10-CM | POA: Diagnosis not present

## 2021-07-04 ENCOUNTER — Telehealth: Payer: Self-pay

## 2021-07-04 NOTE — Telephone Encounter (Signed)
Patient was contacted and informed her of the below information. Patient agreed on doing an EGD in 3 years. Therefore, I told her that I would put her on our recall list. Patient had no further questions.

## 2021-07-04 NOTE — Telephone Encounter (Signed)
-----   Message from [name], MD sent at 07/01/2021 12:12 PM EST ----- Inform the biopsies from the stomach showed intestinal metaplasia, gastritis.  No other changes.  Repeat EGD in 3 years.  Biopsies from the colon 2 of them were adenomas.  Usually we would repeat in 7 years but she would be 59 hence I would not recommend further colonoscopy for colon cancer screening but we could discuss it further in 7 years time if needed.

## 2021-07-08 ENCOUNTER — Ambulatory Visit: Payer: Medicare Other | Admitting: Physical Therapy

## 2021-07-09 ENCOUNTER — Other Ambulatory Visit: Payer: Self-pay

## 2021-07-09 ENCOUNTER — Encounter: Payer: Self-pay | Admitting: Nurse Practitioner

## 2021-07-09 ENCOUNTER — Ambulatory Visit (INDEPENDENT_AMBULATORY_CARE_PROVIDER_SITE_OTHER): Payer: Medicare Other | Admitting: Nurse Practitioner

## 2021-07-09 ENCOUNTER — Telehealth: Payer: Self-pay | Admitting: Physical Therapy

## 2021-07-09 VITALS — BP 150/94 | HR 76 | Temp 98.6°F | Resp 16 | Ht 65.0 in | Wt 241.0 lb

## 2021-07-09 DIAGNOSIS — I10 Essential (primary) hypertension: Secondary | ICD-10-CM | POA: Diagnosis not present

## 2021-07-09 DIAGNOSIS — R42 Dizziness and giddiness: Secondary | ICD-10-CM

## 2021-07-09 MED ORDER — MECLIZINE HCL 25 MG PO TABS: 25.0000 mg | ORAL_TABLET | Freq: Three times a day (TID) | ORAL | 2 refills | Status: DC | PRN

## 2021-07-09 MED ORDER — AMLODIPINE BESYLATE 10 MG PO TABS: 10.0000 mg | ORAL_TABLET | Freq: Every day | ORAL | 1 refills | Status: DC

## 2021-07-09 NOTE — Progress Notes (Signed)
[facility] [place], [place] [address]  Internal MEDICINE  Office Visit Note  Patient Name: Whitney [name]  [id number]  [id number]  Date of Service: 07/09/2021  Chief Complaint  Patient presents with   Follow-up    Pains in back of head, numbs jaw, discuss meds   Depression   Anxiety   Hypertension   Medication Refill   Dizziness    HPI Seraphina presents for a follow up visit for hypertension and dizziness. She reports that she has pains in the back of her head and numbness in her jaw.  She is going back to her rheumatologist for spondylosis.  She is taking amlodipine 5 mg daily. Her blood pressure is still elevated.     Current Medication: Outpatient Encounter Medications as of 07/09/2021  Medication Sig   albuterol (VENTOLIN HFA) 108 (90 Base) MCG/ACT inhaler Inhale 2 puffs into the lungs every 4 (four) hours as needed for wheezing or shortness of breath.   amLODipine (NORVASC) 10 MG tablet Take 1 tablet (10 mg total) by mouth daily.   aspirin 81 MG chewable tablet Chew 1 tablet (81 mg total) by mouth 2 (two) times daily.   carvedilol (COREG) 6.25 MG tablet Take 1 tablet (6.25 mg total) by mouth 2 (two) times daily with a meal.   Cholecalciferol (VITAMIN D) 50 MCG (2000 UT) tablet Take 2,000 Units by mouth in the morning and at bedtime.    diclofenac Sodium (VOLTAREN) 1 % GEL Apply 2 g topically daily as needed (Knee pain).   docusate sodium (COLACE) 100 MG capsule Take 1 capsule (100 mg total) by mouth 2 (two) times daily.   ezetimibe (ZETIA) 10 MG tablet Take 1 tablet (10 mg total) by mouth daily. (Patient taking differently: Take 10 mg by mouth every morning.)   flecainide (TAMBOCOR) 50 MG tablet Take 50 mg by mouth as needed.   gabapentin (NEURONTIN) 100 MG capsule Take 200 mg by mouth 2 (two) times daily.   hydrALAZINE (APRESOLINE) 25 MG tablet Take 1 tablet (25 mg total) by mouth 3 (three) times daily. For HTN   HYDROcodone-acetaminophen (NORCO)  7.5-325 MG tablet Take 1 tablet by mouth every 4 (four) hours as needed for moderate pain (pain score 7-10).   meclizine (ANTIVERT) 25 MG tablet Take 1 tablet (25 mg total) by mouth 3 (three) times daily as needed for dizziness.   methocarbamol (ROBAXIN) 500 MG tablet Take 1 tablet (500 mg total) by mouth 4 (four) times daily.   orlistat (ALLI) 60 MG capsule Take 60 mg by mouth 3 (three) times daily with meals.   rOPINIRole (REQUIP) 0.5 MG tablet Take 0.5-1 mg by mouth 2 (two) times daily as needed (pain).   [DISCONTINUED] amLODipine (NORVASC) 5 MG tablet TAKE 1 TABLET BY MOUTH TWICE DAILY   No facility-administered encounter medications on file as of 07/09/2021.    Surgical History: Past Surgical History:  Procedure Laterality Date   BIOPSY  10/31/2019   Procedure: BIOPSY;  Surgeon: Rush Landmark Telford Nab., MD;  Location: [place];  Service: Gastroenterology;;   CARDIAC CATHETERIZATION     CHOLECYSTECTOMY     COLONOSCOPY WITH PROPOFOL N/A 06/13/2019   Procedure: COLONOSCOPY WITH PROPOFOL;  Surgeon: [name], MD;  Location: [facility] ENDOSCOPY;  Service: Gastroenterology;  Laterality: N/A;   COLONOSCOPY WITH PROPOFOL N/A 08/12/2019   Procedure: COLONOSCOPY WITH PROPOFOL;  Surgeon: [name], MD;  Location: [facility] ENDOSCOPY;  Service: Gastroenterology;  Laterality: N/A;   COLONOSCOPY WITH PROPOFOL N/A 09/19/2019  Procedure: COLONOSCOPY WITH PROPOFOL;  Surgeon: [name], MD;  Location: [facility] ENDOSCOPY;  Service: Gastroenterology;  Laterality: N/A;   COLONOSCOPY WITH PROPOFOL N/A 06/04/2021   Procedure: COLONOSCOPY WITH PROPOFOL;  Surgeon: [name], MD;  Location: [facility] ENDOSCOPY;  Service: Gastroenterology;  Laterality: N/A;   DILATION AND CURETTAGE OF UTERUS     ESOPHAGOGASTRODUODENOSCOPY (EGD) WITH PROPOFOL N/A 06/13/2019   Procedure: ESOPHAGOGASTRODUODENOSCOPY (EGD) WITH PROPOFOL;  Surgeon: [name], MD;  Location: [facility] ENDOSCOPY;  Service: Gastroenterology;  Laterality: N/A;  Pt will  go for COVID test on 63-3-35 as she uses public transportation and will be in the area on that day.    ESOPHAGOGASTRODUODENOSCOPY (EGD) WITH PROPOFOL N/A 10/31/2019   Procedure: ESOPHAGOGASTRODUODENOSCOPY (EGD) WITH PROPOFOL;  Surgeon: Rush Landmark Telford Nab., MD;  Location: [place];  Service: Gastroenterology;  Laterality: N/A;   ESOPHAGOGASTRODUODENOSCOPY (EGD) WITH PROPOFOL N/A 06/04/2021   Procedure: ESOPHAGOGASTRODUODENOSCOPY (EGD) WITH PROPOFOL;  Surgeon: [name], MD;  Location: [facility] ENDOSCOPY;  Service: Gastroenterology;  Laterality: N/A;   HEMOSTASIS CLIP PLACEMENT  10/31/2019   Procedure: HEMOSTASIS CLIP PLACEMENT;  Surgeon: Irving Copas., MD;  Location: [place];  Service: Gastroenterology;;   HERNIA REPAIR     HYSTEROSCOPY WITH D & C N/A 02/16/2017   Procedure: DILATATION AND CURETTAGE /HYSTEROSCOPY;  Surgeon: Brayton Mars, MD;  Location: [facility] ORS;  Service: Gynecology;  Laterality: N/A;   JOINT REPLACEMENT Left    knee   kidney removal Left    KIDNEY SURGERY Left 1979   left kidney removed   LEFT HEART CATH AND CORONARY ANGIOGRAPHY N/A 10/21/2017   Procedure: LEFT HEART CATH AND CORONARY ANGIOGRAPHY;  Surgeon: Teodoro Spray, MD;  Location: [place] CV LAB;  Service: Cardiovascular;  Laterality: N/A;   POLYPECTOMY  10/31/2019   Procedure: POLYPECTOMY;  Surgeon: Rush Landmark Telford Nab., MD;  Location: [place];  Service: Gastroenterology;;   TOTAL KNEE ARTHROPLASTY Left 07/06/2017   Procedure: TOTAL KNEE ARTHROPLASTY;  Surgeon: Lovell Sheehan, MD;  Location: [facility];  Service: Orthopedics;  Laterality: Left;   TOTAL KNEE ARTHROPLASTY Right 02/25/2021   Procedure: TOTAL KNEE ARTHROPLASTY;  Surgeon: Lovell Sheehan, MD;  Location: [facility];  Service: Orthopedics;  Laterality: Right;   UPPER ESOPHAGEAL ENDOSCOPIC ULTRASOUND (EUS) N/A 10/31/2019   Procedure: UPPER ESOPHAGEAL ENDOSCOPIC ULTRASOUND (EUS);  Surgeon: Irving Copas., MD;   Location: [place];  Service: Gastroenterology;  Laterality: N/A;   WRIST ARTHROSCOPY Left 1970s   XI ROBOTIC ASSISTED VENTRAL HERNIA N/A 12/01/2019   Procedure: XI ROBOTIC ASSISTED VENTRAL HERNIA;  Surgeon: Jules Husbands, MD;  Location: [facility] ORS;  Service: General;  Laterality: N/A;    Medical History: Past Medical History:  Diagnosis Date   Abnormal Pap smear of cervix    Pt states she had colposcopy    Anxiety    Aortic atherosclerosis (HCC)    Arthritis    CAD (coronary artery disease)    Chronic kidney disease    s/p LEFT nephrectomy   Chronic lower back pain    COVID    Depression    Diverticulitis    Dyspnea    History of methicillin resistant staphylococcus aureus (MRSA) 12/21/2020   nasal swab pcr   Hx of unilateral nephrectomy 1979   Left Nephrectomy   Hypertension    Lower extremity edema    PAF (paroxysmal atrial fibrillation) (HCC)    Palpitations    PMB (postmenopausal bleeding)    Restless leg syndrome    Sleep apnea  has no cpap   Symptomatic bradycardia    Vitamin D deficiency     Family History: Family History  Problem Relation Age of Onset   Ovarian cancer Mother    Hypertension Father    Diabetes Father    Cancer Father    Breast cancer Sister    Diabetes Sister     Social History   Socioeconomic History   Marital status: Divorced    Spouse name: Not on file   Number of children: Not on file   Years of education: Not on file   Highest education level: Not on file  Occupational History   Not on file  Tobacco Use   Smoking status: Never   Smokeless tobacco: Never  Vaping Use   Vaping Use: Never used  Substance and Sexual Activity   Alcohol use: Yes    Alcohol/week: 1.0 standard drink    Types: 1 Standard drinks or equivalent per week    Comment: occasionally   Drug use: Yes    Frequency: 3.0 times per week    Types: Marijuana    Comment: 07/09/20 Pt states she uses for leg pain: marijuana; states no longer uses cocaine    Sexual activity: Yes    Birth control/protection: None  Other Topics Concern   Not on file  Social History Narrative   Lives with cousin.   Social Determinants of Health   Financial Resource Strain: Low Risk    Difficulty of Paying Living Expenses: Not very hard  Food Insecurity: Not on file  Transportation Needs: Not on file  Physical Activity: Not on file  Stress: Not on file  Social Connections: Not on file  Intimate Partner Violence: Not on file      Review of Systems  Constitutional:  Negative for chills, fatigue and unexpected weight change.  HENT:  Negative for congestion, rhinorrhea, sneezing and sore throat.   Eyes:  Negative for redness.  Respiratory: Negative.  Negative for cough, chest tightness, shortness of breath and wheezing.   Cardiovascular: Negative.  Negative for chest pain and palpitations.  Gastrointestinal:  Negative for abdominal pain, constipation, diarrhea, nausea and vomiting.  Genitourinary:  Negative for dysuria and frequency.  Musculoskeletal:  Negative for arthralgias, back pain, joint swelling and neck pain.  Skin:  Negative for rash.  Neurological:  Positive for dizziness. Negative for tremors, numbness and headaches.  Hematological:  Negative for adenopathy. Does not bruise/bleed easily.  Psychiatric/Behavioral:  Negative for behavioral problems (Depression), sleep disturbance and suicidal ideas. The patient is not nervous/anxious.    Vital Signs: BP (!) 150/94   Pulse 76   Temp 98.6 F (37 C)   Resp 16   Ht 5\' 5"  (1.651 m)   Wt 241 lb (109.3 kg)   LMP 01/26/2017 Comment: age 14  SpO2 97%   BMI 40.10 kg/m    Physical Exam Vitals reviewed.  Constitutional:      General: She is not in acute distress.    Appearance: Normal appearance. She is obese. She is not ill-appearing.  HENT:     Head: Normocephalic and atraumatic.  Eyes:     Pupils: Pupils are equal, round, and reactive to light.  Cardiovascular:     Rate and Rhythm:  Normal rate and regular rhythm.  Pulmonary:     Effort: Pulmonary effort is normal. No respiratory distress.  Neurological:     Mental Status: She is alert and oriented to person, place, and time.     Cranial Nerves: No  cranial nerve deficit.     Coordination: Coordination normal.     Gait: Gait normal.  Psychiatric:        Mood and Affect: Mood normal.        Behavior: Behavior normal.       Assessment/Plan: 1. Essential hypertension Amlodipine dose increased. Follow up in 1 month.  - amLODipine (NORVASC) 10 MG tablet; Take 1 tablet (10 mg total) by mouth daily.  Dispense: 90 tablet; Refill: 1  2. Dizziness Meclizine prescribed prn for dizziness.  - meclizine (ANTIVERT) 25 MG tablet; Take 1 tablet (25 mg total) by mouth 3 (three) times daily as needed for dizziness.  Dispense: 90 tablet; Refill: 2   General Counseling: Markeia verbalizes understanding of the findings of todays visit and agrees with plan of treatment. I have discussed any further diagnostic evaluation that may be needed or ordered today. We also reviewed her medications today. she has been encouraged to call the office with any questions or concerns that should arise related to todays visit.    No orders of the defined types were placed in this encounter.   Meds ordered this encounter  Medications   meclizine (ANTIVERT) 25 MG tablet    Sig: Take 1 tablet (25 mg total) by mouth 3 (three) times daily as needed for dizziness.    Dispense:  90 tablet    Refill:  2   amLODipine (NORVASC) 10 MG tablet    Sig: Take 1 tablet (10 mg total) by mouth daily.    Dispense:  90 tablet    Refill:  1    Please note increased dose, discontinue 5 mg tablet.    Return in about 1 month (around 08/09/2021) for F/U, eval new med, BP check, Renelle Stegenga PCP.   Total time spent:30 Minutes Time spent includes review of chart, medications, test results, and follow up plan with the patient.   [place] Controlled Substance Database was  reviewed by me.  This patient was seen by Jonetta Osgood, FNP-C in collaboration with Dr. Clayborn Bigness as a part of collaborative care agreement.   Makinzie Considine R. Valetta Fuller, MSN, FNP-C Internal medicine

## 2021-07-09 NOTE — Telephone Encounter (Signed)
Called patient to check on her after she had her visit with PCP today. Patient states her BP was still up so they are increasing her BP medication. They are also starting her on a medication for dizziness and they want her to stop PT for 2 weeks (4 visits) to let her get used to the medication, then she can return. She is also to make an appointment with Dr. Ubaldo Glassing (cardiologist). Patient canceled appointments until 12/21 and confirmed the remaining appointments in Dec. She was appreciate of the call.   Whitney [name]. Graylon Good, PT, DPT 07/09/21, 7:55 PM

## 2021-07-10 ENCOUNTER — Ambulatory Visit: Payer: Medicare Other | Admitting: Physical Therapy

## 2021-07-15 ENCOUNTER — Ambulatory Visit: Payer: Medicare Other | Admitting: Physical Therapy

## 2021-07-17 ENCOUNTER — Encounter: Payer: Medicare Other | Admitting: Physical Therapy

## 2021-07-22 ENCOUNTER — Encounter: Payer: Medicare Other | Admitting: Physical Therapy

## 2021-07-24 ENCOUNTER — Ambulatory Visit: Payer: Medicare Other | Admitting: Physical Therapy

## 2021-07-25 ENCOUNTER — Encounter: Payer: Self-pay | Admitting: Internal Medicine

## 2021-07-30 ENCOUNTER — Ambulatory Visit: Payer: Medicare Other | Admitting: Physical Therapy

## 2021-08-01 ENCOUNTER — Encounter: Payer: Medicare Other | Admitting: Physical Therapy

## 2021-08-11 ENCOUNTER — Encounter: Payer: Self-pay | Admitting: Nurse Practitioner

## 2021-08-13 ENCOUNTER — Ambulatory Visit: Payer: Medicare Other | Admitting: Nurse Practitioner

## 2021-08-13 ENCOUNTER — Ambulatory Visit: Payer: Medicaid Other | Admitting: Physical Therapy

## 2021-08-13 ENCOUNTER — Telehealth: Payer: Self-pay | Admitting: Physical Therapy

## 2021-08-13 NOTE — Telephone Encounter (Signed)
Called patient when she did not come to her appointment scheduled today at 1:45pm. Patient answered and was tearful. She stated she is going through some stuff and has been given short notice to get out of her apartment so she is trying to pack up so she can move in with her niece in [place]. She states she needs to get that done before she can arrange transportation for PT. She requested we cancel the rest of appointments in January to allow her to get things in order so she can come back in February.   Whitney [name]. Whitney [name], PT, DPT 08/13/21, 1:56 PM

## 2021-08-14 ENCOUNTER — Ambulatory Visit: Payer: Medicaid Other | Admitting: Physical Therapy

## 2021-08-19 ENCOUNTER — Encounter: Payer: Medicare Other | Admitting: Physical Therapy

## 2021-08-22 ENCOUNTER — Encounter: Payer: Medicare Other | Admitting: Physical Therapy

## 2021-08-27 ENCOUNTER — Encounter: Payer: Medicare Other | Admitting: Physical Therapy

## 2021-08-29 ENCOUNTER — Encounter: Payer: Medicare Other | Admitting: Physical Therapy

## 2021-09-02 ENCOUNTER — Encounter: Payer: Medicare Other | Admitting: Physical Therapy

## 2021-09-05 ENCOUNTER — Encounter: Payer: Medicare Other | Admitting: Physical Therapy

## 2021-09-05 ENCOUNTER — Encounter: Payer: Commercial Managed Care - HMO | Admitting: Physical Therapy

## 2021-09-10 ENCOUNTER — Encounter: Payer: Medicare Other | Admitting: Physical Therapy

## 2021-09-10 ENCOUNTER — Encounter: Payer: Commercial Managed Care - HMO | Admitting: Physical Therapy

## 2021-09-11 ENCOUNTER — Telehealth: Payer: Self-pay | Admitting: Student-PharmD

## 2021-09-11 NOTE — Progress Notes (Addendum)
General Review Call   Whitney, [name] [id number] 43 years, Female  DOB: [birth year]  M: [phone]  Diabetes (DM) Review (HC) Completed by Charlann Lange on 09/11/2021  Chart Review Is the patient enrolled in RPM with the glucometer?: No  A1C Reading #1 (last): 5.7 on: 11/17/2019  A1C Reading #2: 5.8 on: 09/27/2018  When was patient's last eye exam?: Unkown  The patient's glycemic control has: Remained Stable  What recent interventions have been made by any provider to improve the patient's conditions in the last 3 months?:  Office Visit: 07/09/22 Jonetta Osgood, NP. for Hypertension STARTED Meclizine 25 mg 3 times daily PRN. CHANGED Amlodipine ro 10 mg daily.  Any recent hospitalizations or ED visits since last visit with CPP?: No  Adherence Review Adherence rates for STAR metric medications: None.  Adherence rates for medications indicated for disease state being reviewed: None.  Does the patient have >5 day gap between last estimated fill dates for any of the above medications?: No  Disease State Questions Able to connect with the Patient?: Yes  Misc. Response/Information:: Patient stated she has been too depressed and stressed. Patient stated she is staying with friends/family. Patient is not a diabetic not sure what the mix up was. Patient does want and need a call from you in regards of placement in a home she does not have a steady place to live . She needs help going over her medication with the move she doesn't know where all of her medications are and needs assistance on what is best for her as far as the pharmacy she uses.  Engagement Notes Charlann Lange on 09/11/2021 03:43 PM [facility] Chart Review: 10 min 09/11/21 [facility] Assessment call time spent: 10 min 09/11/21   Charlann Lange, [place]  [phone]  Pharmacist Review Adherence gaps identified?: No Drug Therapy Problems identified?: No Assessment: Controlled Other notes: Patient  is currently scheduled for CCM Visit in May. Will reach out to try and r/s appt for sooner to address her concerns  Alena Bills Clinical Pharmacist [phone]

## 2021-09-17 ENCOUNTER — Encounter: Payer: Medicare Other | Admitting: Physical Therapy

## 2021-09-17 ENCOUNTER — Encounter: Payer: Commercial Managed Care - HMO | Admitting: Physical Therapy

## 2021-09-18 ENCOUNTER — Other Ambulatory Visit: Payer: Self-pay

## 2021-09-18 ENCOUNTER — Ambulatory Visit: Payer: Medicare Other | Admitting: Student-PharmD

## 2021-09-18 DIAGNOSIS — E782 Mixed hyperlipidemia: Secondary | ICD-10-CM

## 2021-09-18 DIAGNOSIS — I10 Essential (primary) hypertension: Secondary | ICD-10-CM

## 2021-09-18 NOTE — Progress Notes (Signed)
Follow Up Pharmacist Visit   Whitney, [name] 25 years, Female  DOB: 1951/04/15  M: [id number]  Clinical Summary Patient Risk: High Next CCM Follow Up: 01/01/22 Next AWV: 11/13/21 Next PCP Visit: 11/13/21 Summary for PCP: Patient is currently between a place to stay and has been staying with local family members. When patient lost her apartment she also lost a lot of her medications. Has currently only been taking amlodipine, gabapentin, and ropinirole. Provided patient with a list of her maintenance medications and patient will call pharmacy today for refills. Provided patient with resources/phone numbers for housing assistance.  Patient Chart Prep  Completed by Charlann Lange on 09/18/2021  Chronic Conditions Patient's Chronic Conditions: Atrial Fibrillation, Hyperlipidemia/Dyslipidemia (HLD), Hypertension (HTN), Insomnia, Osteoarthritis  Doctor and Hospital Visits Were there PCP Visits since last visit with the Pharmacist?: No Were there Specialist Visits since last visit with the Pharmacist?: No Was there a Hospital Visit in last 30 days?: No Were there other Hospital Visits since last visit with the Pharmacist?: No  Medication Information Have there been any medication changes from PCP or Specialist since last visit with the Pharmacist?: No Are there any Medication adherence gaps (beyond 5 days past due)?: No Medication adherence rates for the STAR rating drugs: None. List Patient's current Care Gaps: No current Care Gaps identified  Pre-Call Questions  Completed by Charlann Lange on 09/18/2021  Are you able to connect with Patient: Yes Confirmed appointment date/time with patient/caregiver?: Yes Date/time of the appointment: 09/18/21 at 1:30 PM Visit type: Phone Patient/Caregiver instructed to bring medications to appointment: Yes What, if any, problems do you have getting your medications from the pharmacy?: Financial and Access barriers What is your top  health concern to discuss at your upcoming visit?: Patient stated she needs to figure out her medications and if there is any assistance with housing, Patient did not want to explain details to me at this time. Have you seen any other providers since your last visit?: No  Disease Assessments  Subjective Information Current BP: 150/94 Current HR: 76 taken on: 07/09/2021 Weight: 241 BMI: 40.10 Last GFR: 43 taken on: 03/21/2021 Why did the patient present?: CCM F/U visit Family, occupational, and living circumstances relevant to overall health?: Patient is currently without a home and has been moving around from different family member's places to keep a roof over her head. During this time, a lot of her medications have been misplaced and she wasn't sure what she was suppose to be taking. Factors that may affect medication adherence?: Unstable living conditions Any additional demeanor/mood notes?: Patient presented via telephone and was very pleasant to speak with.  Hypertension (HTN) Assess this condition today?: Yes Is patient able to obtain BP reading today?: No Goal: <130/80 mmHG Hypertension Stage: Stage 2 (SBP >140 or DBP > 90) Is Patient checking BP at home?: No How often does patient miss taking their blood pressure medications?: has been without carvedilol and hydralazine Has patient experienced hypotension, dizziness, falls or bradycardia?: No Assessment:: Uncontrolled Drug: Amlodipine 37m 1 tablet daily Assessment: Appropriate, Effective, Safe, Accessible Drug: Carvedilol 6.222m1 tablet daily Assessment: Appropriate, Effective, Safe, Query Accessibility Drug: Hydralazine 2548m tab three times daily Assessment: Appropriate, Effective, Safe, Query Accessibility Additional Info: Patient lost her home and along with that, most of her medications. Provided patient with list of medications she should be taking. Patient will call the pharmacy to get medications refilled. Plan  to: Continue medication therapy HC Follow up: 4 weeks Pharmacist Follow  up: 5/31  Hyperlipidemia/Dyslipidemia (HLD) Last Lipid panel on: 11/17/2019 TC (Goal<200): 225 LDL: 148 HDL (Goal>40): 59 TG (Goal<150): 104 ASCVD 10-year risk?is:: High (>20%) ASCVD Risk Score: 18.5% Assess this condition today?: Yes LDL Goal: <70 Has patient tried and failed any HLD Medications?: Yes Medications failed: atorvastatin Check present secondary causes (below) that can lead to increased cholesterol levels (multi-choice optional): Beta blockers We discussed: How to reduce cholesterol through diet/weight management and physical activity., How a diet high in plant sterols (fruits/vegetables/nuts/whole grains/legumes) may reduce your cholesterol. Assessment:: Uncontrolled Drug: Ezetimibe 5m 1 tablet daily Assessment: Appropriate, Effective, Safe, Accessible Additional Info: Patient has a small supply on hand but will call pharmacy to get refilled. Will check panel at next visit and may need to trial a different statin if no improvement. HC Follow up: 1 month Pharmacist Follow up: 5/31  Exercise, Diet and Non-Drug Coordination Needs Discussed Non-Drug Care Coordination Needs: Yes  Accountable Health Communities Health-Related Social Needs Screening Tool -  SDOH  What is your living situation today? (ref #1): I do not have a steady place to live (I am temporarily staying with others, in a hotel, in a shelter, living outside on the street, on a beach, in a car, abandoned building, bus or train station, or in a park) Think about the place you live. Do you have problems with any of the following? (ref #2): None of the above Within the past 12 months, you worried that your food would run out before you got money to buy more (ref #3): Never true Within the past 12 months, the food you bought just didn't last and you didn't have money to get more (ref #4): Never true In the past 12 months, has lack of  reliable transportation kept you from medical appointments, meetings, work or from getting things needed for daily living? (ref #5): Yes In the past 12 months, has the electric, gas, oil, or water company threatened to shut off services in your home? (ref #6): No How often does anyone, including family and friends, physically hurt you? (ref #7): Never (1) How often does anyone, including family and friends, insult or talk down to you? (ref #8): Never (1) How often does anyone, including friends and family, threaten you with harm? (ref #9): Never (1) How often does anyone, including family and friends, scream or curse at you? (ref #10): Never (1)  Managing Stress, Adult Feeling a certain amount of stress is normal. Stress helps our body and mind get ready to deal with the demands of life. Stress hormones can motivate you to do well at work and meet your responsibilities. But severe or long-term (chronic) stress can affect your mental and physical health. Chronic stress puts you at higher risk for: Anxiety and depression. Other health problems such as digestive problems, muscle aches, heart disease, high blood pressure, and stroke. What are the causes? Common causes of stress include: Demands from work, such as deadlines, feeling overworked, or having long hours. Pressures at home, such as money issues, disagreements with a spouse, or parenting issues. Pressures from major life changes, such as divorce, moving, loss of a loved one, or chronic illness. You may be at higher risk for stress-related problems if you: Do not get enough sleep. Are in poor health. Do not have emotional support. Have a mental health disorder such as anxiety or depression. How to recognize stress Stress can make you: Have trouble sleeping. Feel sad, anxious, irritable, or overwhelmed. Lose your appetite. Overeat or  want to eat unhealthy foods. Want to use drugs or alcohol. Stress can also cause physical symptoms,  such as: Sore, tense muscles, especially in the shoulders and neck. Headaches. Trouble breathing. A faster heart rate. Stomach pain, nausea, or vomiting. Diarrhea or constipation. Trouble concentrating. Follow these instructions at home: Eating and drinking Eat a healthy diet. This includes: Eating foods that are high in fiber, such as beans, whole grains, and fresh fruits and vegetables. Limiting foods that are high in fat and processed sugars, such as fried or sweet foods. Do not skip meals or overeat. Drink enough fluid to keep your urine pale yellow. Alcohol use Do not drink alcohol if: Your health care provider tells you not to drink. You are pregnant, may be pregnant, or are planning to become pregnant. Drinking alcohol is a way some people try to ease their stress. This can be dangerous, so if you drink alcohol: Limit how much you have to: 0-1 drink a day for women. 0-2 drinks a day for men. Know how much alcohol is in your drink. In the [place] one drink equals one 12 oz bottle of beer (355 mL), one 5 oz glass of wine (148 mL), or one 1 oz glass of hard liquor (44 mL). Activity A person running on a dirt path.  Include 30 minutes of exercise in your daily schedule. Exercise is a good stress reducer. Include time in your day for an activity that you find relaxing. Try taking a walk, going on a bike ride, reading a book, or listening to music. Schedule your time in a way that lowers stress, and keep a regular schedule. Focus on doing what is most important to get done. Lifestyle Identify the source of your stress and your reaction to it. See a therapist who can help you change unhelpful reactions. When there are stressful events: Talk about them with family, friends, or coworkers. Try to think realistically about stressful events and not ignore them or overreact. Try to find the positives in a stressful situation and not focus on the negatives. Cut back on responsibilities at  work and home, if possible. Ask for help from friends or family members if you need it. Find ways to manage stress, such as: Mindfulness, meditation, or deep breathing. Yoga or tai chi. Progressive muscle relaxation. Spending time in nature. Doing art, playing music, or reading. Making time for fun activities. Spending time with family and friends. Get support from family, friends, or spiritual resources. General instructions Get enough sleep. Try to go to sleep and get up at about the same time every day. Take over-the-counter and prescription medicines only as told by your health care provider. Do not use any products that contain nicotine or tobacco. These products include cigarettes, chewing tobacco, and vaping devices, such as e-cigarettes. If you need help quitting, ask your health care provider. Do not use drugs or smoke to deal with stress. Keep all follow-up visits. This is important. Where to find support Talk with your health care provider about stress management or finding a support group. Find a therapist to work with you on your stress management techniques. Where to find more information [employer] on Mental Illness: www.nami.org American Psychological Association: TVStere[employer].ch Contact a health care provider if: Your stress symptoms get worse. You are unable to manage your stress at home. You are struggling to stop using drugs or alcohol. Get help right away if: You may be a danger to yourself or others. You have any thoughts of  death or suicide. Get help right awayif you feel like you may hurt yourself or others, or have thoughts about taking your own life. Go to your nearest emergency room or: Call 911. Call the [address] at 3378544439 or 988 in the [place] This is open 24 hours a day. Text the Crisis Text Line at 949-133-0940. Summary Feeling a certain amount of stress is normal, but severe or long-term (chronic) stress can affect your  mental and physical health. Chronic stress can put you at higher risk for anxiety, depression, and other health problems such as digestive problems, muscle aches, heart disease, high blood pressure, and stroke. You may be at higher risk for stress-related problems if you do not get enough sleep, are in poor health, lack emotional support, or have a mental health disorder such as anxiety or depression. Identify the source of your stress and your reaction to it. Try talking about stressful events with family, friends, or coworkers, finding a coping method, or getting support from spiritual resources. If you need more help, talk with your health care provider about finding a support group or a mental health therapist. This information is not intended to replace advice given to you by your health care provider. Make sure you discuss any questions you have with your health care provider.     Engagement Notes HC Chart Review: 10 min 09/18/21 HC Assessment call time spent: 10 min 09/18/21 CPP Office Visit Documentation: 33 min 09/18/21 CP Chart Review: 16 min 09/16/21 CP Office Visit:36 min 09/18/21  Alena Bills Clinical Pharmacist [phone]

## 2021-09-19 ENCOUNTER — Encounter: Payer: Commercial Managed Care - HMO | Admitting: Physical Therapy

## 2021-09-19 ENCOUNTER — Encounter: Payer: Medicare Other | Admitting: Physical Therapy

## 2021-09-24 ENCOUNTER — Encounter: Payer: Commercial Managed Care - HMO | Admitting: Physical Therapy

## 2021-09-24 ENCOUNTER — Encounter: Payer: Medicare Other | Admitting: Physical Therapy

## 2021-10-01 ENCOUNTER — Encounter: Payer: Medicare Other | Admitting: Physical Therapy

## 2021-10-01 ENCOUNTER — Encounter: Payer: Commercial Managed Care - HMO | Admitting: Physical Therapy

## 2021-10-01 ENCOUNTER — Telehealth: Payer: Self-pay | Admitting: Student-PharmD

## 2021-10-01 DIAGNOSIS — M1712 Unilateral primary osteoarthritis, left knee: Secondary | ICD-10-CM | POA: Diagnosis not present

## 2021-10-01 DIAGNOSIS — E782 Mixed hyperlipidemia: Secondary | ICD-10-CM | POA: Diagnosis not present

## 2021-10-01 DIAGNOSIS — I10 Essential (primary) hypertension: Secondary | ICD-10-CM | POA: Diagnosis not present

## 2021-10-01 NOTE — Progress Notes (Signed)
°  Chronic Care Management Pharmacy Assistant   Name: Whitney [name]  MRN: [id number] DOB: [birth year]  Reason for Encounter: Care Plan and Patient handout.    Medications: Outpatient Encounter Medications as of 10/01/2021  Medication Sig   albuterol (VENTOLIN HFA) 108 (90 Base) MCG/ACT inhaler Inhale 2 puffs into the lungs every 4 (four) hours as needed for wheezing or shortness of breath.   amLODipine (NORVASC) 10 MG tablet Take 1 tablet (10 mg total) by mouth daily.   aspirin 81 MG chewable tablet Chew 1 tablet (81 mg total) by mouth 2 (two) times daily.   carvedilol (COREG) 6.25 MG tablet Take 1 tablet (6.25 mg total) by mouth 2 (two) times daily with a meal.   Cholecalciferol (VITAMIN D) 50 MCG (2000 UT) tablet Take 2,000 Units by mouth in the morning and at bedtime.    diclofenac Sodium (VOLTAREN) 1 % GEL Apply 2 g topically daily as needed (Knee pain).   docusate sodium (COLACE) 100 MG capsule Take 1 capsule (100 mg total) by mouth 2 (two) times daily.   ezetimibe (ZETIA) 10 MG tablet Take 1 tablet (10 mg total) by mouth daily. (Patient taking differently: Take 10 mg by mouth every morning.)   flecainide (TAMBOCOR) 50 MG tablet Take 50 mg by mouth as needed.   gabapentin (NEURONTIN) 100 MG capsule Take 200 mg by mouth 2 (two) times daily.   hydrALAZINE (APRESOLINE) 25 MG tablet Take 1 tablet (25 mg total) by mouth 3 (three) times daily. For HTN   HYDROcodone-acetaminophen (NORCO) 7.5-325 MG tablet Take 1 tablet by mouth every 4 (four) hours as needed for moderate pain (pain score 7-10).   meclizine (ANTIVERT) 25 MG tablet Take 1 tablet (25 mg total) by mouth 3 (three) times daily as needed for dizziness.   methocarbamol (ROBAXIN) 500 MG tablet Take 1 tablet (500 mg total) by mouth 4 (four) times daily.   orlistat (ALLI) 60 MG capsule Take 60 mg by mouth 3 (three) times daily with meals.   rOPINIRole (REQUIP) 0.5 MG tablet Take 0.5-1 mg by mouth 2 (two) times daily as needed (pain).   No  facility-administered encounter medications on file as of 10/01/2021.   Reviewed the patients visit reinsured it was completed per the pharmacist Alena Bills request. Printed the CCM care plan. Mailed the patient CCM care plan and patient handout to their most recent address on file.  Time: 3 min  Charlann Lange, [place]  [phone]

## 2021-10-14 ENCOUNTER — Telehealth: Payer: Self-pay | Admitting: Student-PharmD

## 2021-10-14 NOTE — Progress Notes (Addendum)
Hypertension (HTN) Review Call  ? ?Whitney [name], MCKENDREE [id number] ?35 years, Female  DOB: June 16, 1951  M: [phone] ? ?Hypertension Review (HC) ?Completed by Charlann Lange on 10/14/2021 ? ?Chart Review ?Is the patient enrolled in RPM with BP Monitor?: No ? ?BP #1 reading (last): 150/94 ?on: 07/09/2021 ? ?BP #2 reading: 165/100 ?on: 07/01/2021 ? ?BP #3 reading: 184/100 ?on: 06/04/2021 ? ?Any of the last 3 BP > 140/90 mmHg?: Yes ? ?What recent interventions have been made by any provider to improve the patient's conditions in the last 3 months?: None . ? ?Any recent hospitalizations or ED visits since last visit with CPP?: No ? ?Adherence Review ?Adherence rates for STAR metric medications: None. ? ?Adherence rates for medications indicated for disease state being reviewed: None. ? ?Does the patient have >5 day gap between last estimated fill dates for any of the above medications?: No ? ?Disease State Questions ?Able to connect with the Patient?: Yes ? ?Is the patient monitoring his/her BP?: Yes ? ?How often are you checking your BP?: occasionally ? ?Home BP Reading #1 (most recent): Patient stated within normal range  ? ?Is the patient having any low BP Readings <90/60?: No ? ?Is the patient having any BP readings above >180/100?: No ? ?Is the patient's average BP>140/90?: No ? ?What is your blood pressure goal?: 120/80 ? ?Educate patient to inform proper points on checking BP at home:: When taking resting blood pressure: sit quietly for 5 minutes, not within 30 min. of exercising, no talking., Sit with feet flat on the floor, arm at heart level. ? ?What diet changes have you made to improve your Blood Pressure Control?: eating more home-cooked meals ?What exercise are you doing to improve your Blood Pressure Control?: no formal exercise ? ?Misc. Response/Information:: Patient stated she is doing a lot better and feeling a lot better. Patient stated she has stable place to stay for now.  ? ?Engagement  Notes ?Charlann Lange on 10/09/2021 02:35 PM ?[facility] Chart Review: 8 min 10/09/21 ?[facility] Assessment call time spent: 17 min 10/14/21 ? ?Charlann Lange, RMA ?Healthcare Concierge  ?[phone] ? ?Pharmacist Review ?Adherence gaps identified?: No ?Drug Therapy Problems identified?: No ?Assessment: Controlled ? ?Alena Bills ?Clinical Pharmacist ? ?

## 2021-10-18 DIAGNOSIS — M65312 Trigger thumb, left thumb: Secondary | ICD-10-CM | POA: Diagnosis not present

## 2021-10-18 DIAGNOSIS — M7062 Trochanteric bursitis, left hip: Secondary | ICD-10-CM | POA: Diagnosis not present

## 2021-10-21 ENCOUNTER — Ambulatory Visit: Payer: Medicare Other | Admitting: Nurse Practitioner

## 2021-10-28 DIAGNOSIS — M5030 Other cervical disc degeneration, unspecified cervical region: Secondary | ICD-10-CM | POA: Diagnosis not present

## 2021-10-28 DIAGNOSIS — M5412 Radiculopathy, cervical region: Secondary | ICD-10-CM | POA: Diagnosis not present

## 2021-10-28 DIAGNOSIS — M4802 Spinal stenosis, cervical region: Secondary | ICD-10-CM | POA: Diagnosis not present

## 2021-10-28 DIAGNOSIS — M4312 Spondylolisthesis, cervical region: Secondary | ICD-10-CM | POA: Diagnosis not present

## 2021-10-29 ENCOUNTER — Encounter: Payer: Medicare Other | Admitting: Obstetrics and Gynecology

## 2021-10-30 ENCOUNTER — Telehealth: Payer: Medicare Other

## 2021-10-31 ENCOUNTER — Other Ambulatory Visit: Payer: Self-pay | Admitting: Nurse Practitioner

## 2021-10-31 DIAGNOSIS — J4520 Mild intermittent asthma, uncomplicated: Secondary | ICD-10-CM

## 2021-11-06 ENCOUNTER — Other Ambulatory Visit: Payer: Self-pay | Admitting: Physician Assistant

## 2021-11-06 DIAGNOSIS — R519 Headache, unspecified: Secondary | ICD-10-CM | POA: Diagnosis not present

## 2021-11-06 DIAGNOSIS — M79604 Pain in right leg: Secondary | ICD-10-CM | POA: Diagnosis not present

## 2021-11-06 DIAGNOSIS — M542 Cervicalgia: Secondary | ICD-10-CM | POA: Diagnosis not present

## 2021-11-06 DIAGNOSIS — G2581 Restless legs syndrome: Secondary | ICD-10-CM | POA: Diagnosis not present

## 2021-11-06 DIAGNOSIS — R29898 Other symptoms and signs involving the musculoskeletal system: Secondary | ICD-10-CM | POA: Diagnosis not present

## 2021-11-06 DIAGNOSIS — M79605 Pain in left leg: Secondary | ICD-10-CM | POA: Diagnosis not present

## 2021-11-13 ENCOUNTER — Ambulatory Visit: Payer: Medicare Other | Admitting: Nurse Practitioner

## 2021-11-13 DIAGNOSIS — I480 Paroxysmal atrial fibrillation: Secondary | ICD-10-CM | POA: Diagnosis not present

## 2021-11-13 DIAGNOSIS — I493 Ventricular premature depolarization: Secondary | ICD-10-CM | POA: Diagnosis not present

## 2021-11-13 DIAGNOSIS — I10 Essential (primary) hypertension: Secondary | ICD-10-CM | POA: Diagnosis not present

## 2021-11-13 DIAGNOSIS — I700 Atherosclerosis of aorta: Secondary | ICD-10-CM | POA: Diagnosis not present

## 2021-11-13 DIAGNOSIS — G4733 Obstructive sleep apnea (adult) (pediatric): Secondary | ICD-10-CM | POA: Diagnosis not present

## 2021-11-25 ENCOUNTER — Ambulatory Visit
Admission: RE | Admit: 2021-11-25 | Discharge: 2021-11-25 | Disposition: A | Discharge status: Home / Self Care | Payer: Medicare Other | Source: Ambulatory Visit | Attending: Physician Assistant | Admitting: Physician Assistant

## 2021-11-25 DIAGNOSIS — R519 Headache, unspecified: Secondary | ICD-10-CM | POA: Diagnosis not present

## 2021-12-03 ENCOUNTER — Ambulatory Visit: Payer: Medicare Other | Admitting: Nurse Practitioner

## 2021-12-05 ENCOUNTER — Telehealth: Payer: Self-pay

## 2021-12-05 MED ORDER — CIPROFLOXACIN HCL 500 MG PO TABS: 500.0000 mg | ORAL_TABLET | Freq: Two times a day (BID) | ORAL | 0 refills | Status: AC

## 2021-12-05 MED ORDER — METRONIDAZOLE 500 MG PO TABS: 500.0000 mg | ORAL_TABLET | Freq: Three times a day (TID) | ORAL | 0 refills | Status: AC

## 2021-12-05 NOTE — Telephone Encounter (Signed)
Yes ciprofloxacin 500 mg BID and flagyl 500 mg TID for 10 days, low fiber diet , if symptoms get worse to go to ER, follow up visit with me in 3 weeks . ?Called patient to let her know that Dr. Vicente Males agreed on sending her the antibiotics and recommendations. Patient agreed and had no further questions. Patient knows to follow up with Dr. Vicente Males on 01/02/2022 at 2:30 PM. ?

## 2021-12-05 NOTE — Telephone Encounter (Signed)
Patient called stating that she is currently in [place] and that for the past three days she's been having abdominal pain. Patient stated that she has diverticulitis and she knows that what she is feeling is a flare-up. Patient wants to know if she could get antibiotics. I told her that I will have to mention it to Dr. Vicente Males. Patient agreed. Please advise. ?

## 2021-12-19 ENCOUNTER — Encounter: Payer: Medicare Other | Admitting: Obstetrics and Gynecology

## 2021-12-19 DIAGNOSIS — Z01419 Encounter for gynecological examination (general) (routine) without abnormal findings: Secondary | ICD-10-CM

## 2021-12-19 DIAGNOSIS — Z1231 Encounter for screening mammogram for malignant neoplasm of breast: Secondary | ICD-10-CM

## 2022-01-01 ENCOUNTER — Telehealth: Payer: Medicare Other

## 2022-01-02 ENCOUNTER — Other Ambulatory Visit: Payer: Self-pay

## 2022-01-02 ENCOUNTER — Ambulatory Visit (INDEPENDENT_AMBULATORY_CARE_PROVIDER_SITE_OTHER): Payer: Medicare Other | Admitting: Gastroenterology

## 2022-01-02 ENCOUNTER — Encounter: Payer: Self-pay | Admitting: Gastroenterology

## 2022-01-02 VITALS — BP 177/90 | HR 77 | Temp 98.9°F | Wt 247.8 lb

## 2022-01-02 DIAGNOSIS — R1032 Left lower quadrant pain: Secondary | ICD-10-CM | POA: Diagnosis not present

## 2022-01-02 DIAGNOSIS — K5792 Diverticulitis of intestine, part unspecified, without perforation or abscess without bleeding: Secondary | ICD-10-CM | POA: Diagnosis not present

## 2022-01-02 MED ORDER — CIPROFLOXACIN HCL 500 MG PO TABS: 500.0000 mg | ORAL_TABLET | Freq: Two times a day (BID) | ORAL | 0 refills | Status: DC

## 2022-01-02 MED ORDER — METRONIDAZOLE 500 MG PO TABS: 500.0000 mg | ORAL_TABLET | Freq: Two times a day (BID) | ORAL | 0 refills | Status: DC

## 2022-01-02 NOTE — Patient Instructions (Signed)
We will refer you to see Dr. Dahlia Byes for a consult.

## 2022-01-02 NOTE — Progress Notes (Signed)
[name] MD, MRCP(U.K) [address]  [place]  [place],  [address]  Main: [phone]  Fax: [phone]   Primary Care Physician: Lavera Guise, MD  Primary Gastroenterologist:  Dr. [name]   Chief Complaint  Patient presents with   Abdominal Pain   Diarrhea    HPI: Whitney [name] is a 71 y.o. female   Summary of history :   Initially seen and referred on 11/02/2018 for abdominal pain.  Started after cholecystectomy   Abdominal CT scan in March 2019 showed  A small low-attenuation area within the body of the pancreas.       10/19/2018 HIDA scan: Normal. 02/18/2019: H pylori breath test - negative 03/04/2019: MRCP- progression of the gall bladder polyp which is 10 mm in size . Pancreas lesion is stable repeat in 1 year . 06/13/2019 colonoscopy: Large quantity of stool seen in the rectum and sigmoid repeat colonoscopy recommended.   EGD.  Medium size hiatal hernia seen nodule seen just past the GE junction.  Biopsies of the stomach demonstrated H. pylori gastritis.   Underwent laparoscopic cholecystectomy with Dr. Dahlia Byes in November 2020 which resolved her longstanding abdominal pain   05/27/2019: CT scan of the abdomen and pelvis showed moderate supraumbilical midline ventral hernia containing inflamed flat and small amount of fluid.  Stable small pancreatic cysts.   09/19/2019: Colonoscopy: Poor prep and 3 polyps 4 to 6 mm were resected in the ascending colon with a cold snare.  Could not be retrieved since prep was poor    10/31/2019: EUS: Nodule at the GE junction was resected it was benign.  Biopsies of stomach showed chronic gastritis with focal intestinal metaplasia.  Cystic lesions of the pancreas were noted suggestive of IPMN.      12/01/2019: Ventral hernia repair.   02/15/2020: H. pylori breath test: Negative. 05/14/2020: Descending colon sigmoid colon diverticulitis 10/02/2020: CT scan of the abdomen pelvis with contrast shows pancreas with a 1 cm  hypodense lesion in the distal body similar to CT dated 2019 no new changes.  Acute diverticulitis in the distal descending sigmoid junction.  Treated with antibiotics   Interval history  04/30/2021-01/02/2022   06/04/2021: Colonoscopy: Left-sided diverticulosis 2 polyps in the ascending colon resected 1 polyp in the sigmoid colon.  Upper endoscopy performed on the same day showed congestion in the entire stomach biopsied: Intestinal metaplasia seen in the lesser curvature mild chronic gastritis noted some focal intestinal metaplasia in the lesser curvature the polyps of the colon were tubular adenoma x2.  12/05/2021 she called in with left lower quadrant pain similar to what she had during her episodes of diverticulitis sent her a course of ciprofloxacin and Flagyl with office visit follow-up   This is probably the fourth episode of diverticulitis.  After commencing on the antibiotic she felt much better for a period of time and then subsequently the symptoms have recurred and now she has a pain of 6 out of 10 no fever no rectal bleeding no diarrhea left lower quadrant the same place she gets it and it is brought her to tears.    Current Outpatient Medications  Medication Sig Dispense Refill   albuterol (VENTOLIN HFA) 108 (90 Base) MCG/ACT inhaler Inhale 2 puffs into the lungs every 4 (four) hours as needed for wheezing or shortness of breath. 1 each 3   amLODipine (NORVASC) 10 MG tablet Take 1 tablet (10 mg total) by mouth daily. 90 tablet 1   aspirin 81  MG chewable tablet Chew 1 tablet (81 mg total) by mouth 2 (two) times daily. 60 tablet 0   carvedilol (COREG) 12.5 MG tablet Take 1 tablet by mouth 2 (two) times daily.     Cholecalciferol (VITAMIN D) 50 MCG (2000 UT) tablet Take 2,000 Units by mouth in the morning and at bedtime.      diclofenac Sodium (VOLTAREN) 1 % GEL Apply 2 g topically daily as needed (Knee pain).     docusate sodium (COLACE) 100 MG capsule Take 1 capsule (100 mg total) by  mouth 2 (two) times daily. 20 capsule 0   ezetimibe (ZETIA) 10 MG tablet Take 1 tablet (10 mg total) by mouth daily. (Patient taking differently: Take 10 mg by mouth every morning.) 90 tablet 3   flecainide (TAMBOCOR) 50 MG tablet Take 50 mg by mouth as needed.     gabapentin (NEURONTIN) 100 MG capsule Take 200 mg by mouth 2 (two) times daily.     hydrALAZINE (APRESOLINE) 25 MG tablet Take 1 tablet (25 mg total) by mouth 3 (three) times daily. For HTN 90 tablet 3   HYDROcodone-acetaminophen (NORCO) 7.5-325 MG tablet Take 1 tablet by mouth every 4 (four) hours as needed for moderate pain (pain score 7-10). 30 tablet 0   meclizine (ANTIVERT) 25 MG tablet Take 1 tablet (25 mg total) by mouth 3 (three) times daily as needed for dizziness. 90 tablet 2   methocarbamol (ROBAXIN) 500 MG tablet Take 1 tablet (500 mg total) by mouth 4 (four) times daily. 60 tablet 0   orlistat (ALLI) 60 MG capsule Take 60 mg by mouth 3 (three) times daily with meals.     rOPINIRole (REQUIP) 0.5 MG tablet Take 0.5-1 mg by mouth 2 (two) times daily as needed (pain).     No current facility-administered medications for this visit.    Allergies as of 01/02/2022 - Review Complete 01/02/2022  Allergen Reaction Noted   Enalapril Swelling    Lisinopril Swelling 05/26/2016   Ace inhibitors Swelling 11/13/2020    ROS:  General: Negative for anorexia, weight loss, fever, chills, fatigue, weakness. ENT: Negative for hoarseness, difficulty swallowing , nasal congestion. CV: Negative for chest pain, angina, palpitations, dyspnea on exertion, peripheral edema.  Respiratory: Negative for dyspnea at rest, dyspnea on exertion, cough, sputum, wheezing.  GI: See history of present illness. GU:  Negative for dysuria, hematuria, urinary incontinence, urinary frequency, nocturnal urination.  Endo: Negative for unusual weight change.    Physical Examination:   BP (!) 177/90   Pulse 77   Temp 98.9 F (37.2 C) (Oral)   Wt 247 lb  12.8 oz (112.4 kg)   LMP 01/26/2017 Comment: age 38  BMI 41.24 kg/m   General: Well-nourished, well-developed in no acute distress.  Eyes: No icterus. Conjunctivae pink. Mouth: Oropharyngeal mucosa moist and pink , no lesions erythema or exudate. Abdomen: Mild left lower quadrant tenderness Skin: Warm and dry, no jaundice.   Psych: Alert and cooperative, normal mood and affect.   Imaging Studies: No results found.  Assessment and Plan:   Whitney [name] is a 71 y.o. y/o female here to follow up for acute diverticulitis which she had at the last office visit.  She also has gastric intestinal metaplasia requiring gastric mapping.  She has had at least 4 episodes of diverticulitis which have been treated with antibiotics.     Plan 1.  Recurrent diverticulitis of the left colon: Colonoscopy a few months back showed no other abnormalities.  Since  this is a recurrent issue would recommend referring to Dr. Estrella Myrtle discuss about surgical options.  I will give her another course of antibiotics since she is in significant discomfort and obtain a CT scan of the abdomen 2.  Gastric intestinal metaplasia repeat upper endoscopy in 2025.   Dr [name]  MD,MRCP [facility]) Follow up in as needed if symptoms do not improve

## 2022-01-03 ENCOUNTER — Ambulatory Visit: Admission: RE | Admit: 2022-01-03 | Payer: Medicare Other | Source: Ambulatory Visit

## 2022-01-07 ENCOUNTER — Ambulatory Visit: Payer: Medicare Other | Admitting: Nurse Practitioner

## 2022-01-22 ENCOUNTER — Ambulatory Visit: Payer: Medicare Other | Admitting: Surgery

## 2022-01-27 ENCOUNTER — Ambulatory Visit: Payer: Commercial Managed Care - HMO | Admitting: Gastroenterology

## 2022-02-08 ENCOUNTER — Other Ambulatory Visit: Payer: Self-pay | Admitting: Nurse Practitioner

## 2022-02-08 DIAGNOSIS — I10 Essential (primary) hypertension: Secondary | ICD-10-CM

## 2022-02-09 ENCOUNTER — Other Ambulatory Visit: Payer: Self-pay | Admitting: Nurse Practitioner

## 2022-02-09 DIAGNOSIS — I10 Essential (primary) hypertension: Secondary | ICD-10-CM

## 2022-02-10 ENCOUNTER — Other Ambulatory Visit: Payer: Self-pay

## 2022-02-10 DIAGNOSIS — I10 Essential (primary) hypertension: Secondary | ICD-10-CM

## 2022-02-10 NOTE — Telephone Encounter (Signed)
Spoke with pt she had enough amlodipine for now and also advised need appt for refills and gave tat to make her appt

## 2022-02-17 ENCOUNTER — Ambulatory Visit (INDEPENDENT_AMBULATORY_CARE_PROVIDER_SITE_OTHER): Payer: Medicare Other | Admitting: Surgery

## 2022-02-17 ENCOUNTER — Other Ambulatory Visit: Payer: Self-pay

## 2022-02-17 ENCOUNTER — Encounter: Payer: Self-pay | Admitting: Surgery

## 2022-02-17 VITALS — BP 160/97 | HR 72 | Temp 98.2°F | Ht 65.0 in | Wt 244.0 lb

## 2022-02-17 DIAGNOSIS — K5732 Diverticulitis of large intestine without perforation or abscess without bleeding: Secondary | ICD-10-CM | POA: Diagnosis not present

## 2022-02-17 NOTE — Patient Instructions (Addendum)
CT scheduled 02/21/22 3: 45 @ [facility] patient [facility]. Nothing to eat/drink 4 hours prior. Please pick up prep kit today.   Please see your follow up appointment listed below.

## 2022-02-20 NOTE — Progress Notes (Signed)
Whitney [name] is an 71 y.o. female.   diverticulitis  HPI: Whitney [name] is a 71 year old female well-known to me with prior history of recurrent diverticulitis.  She does have significant medical issues including coronary artery disease, chronic diverticulitis, Ultibro joint replacements.  More recently saw Dr. Vicente Males for another recurrent episode of diverticulitis.  Did have a CT scan that have personally reviewed showing evidence of diverticulitis without abscess.  She was supposed to have another CT and never completed that exam. I did perform a robotic ventral hernia repair over 2 years ago and she has no major issues. 06/13/2019 colonoscopy: Large quantity of stool seen in the rectum and sigmoid repeat colonoscopy recommended.   EGD.  Medium size hiatal hernia seen nodule seen just past the GE junction.  Biopsies of the stomach demonstrated H. pylori gastritis. CT was done last year for an episode of diverticulitis showing evidence of diverticulitis within the sigmoid colon and descending colon.  No evidence of perforation or abscess.  No evidence of recurrent hernia. CBC and BMP was normal except chronic renal insufficiency with a creatinine of 1.3 She does have intermittent abdominal pain that has been present for several months.  She has been tried on multiple rounds of antibiotics with some relief.  Past Medical History:  Diagnosis Date   Abnormal Pap smear of cervix    Pt states she had colposcopy    Anxiety    Aortic atherosclerosis (HCC)    Arthritis    CAD (coronary artery disease)    Chronic kidney disease    s/p LEFT nephrectomy   Chronic lower back pain    COVID    Depression    Diverticulitis    Dyspnea    History of methicillin resistant staphylococcus aureus (MRSA) 12/21/2020   nasal swab pcr   Hx of unilateral nephrectomy 1979   Left Nephrectomy   Hypertension    Lower extremity edema    PAF (paroxysmal atrial fibrillation) (HCC)    Palpitations    PMB  (postmenopausal bleeding)    Restless leg syndrome    Sleep apnea    has no cpap   Symptomatic bradycardia    Vitamin D deficiency     Past Surgical History:  Procedure Laterality Date   BIOPSY  10/31/2019   Procedure: BIOPSY;  Surgeon: Irving Copas., MD;  Location: [place];  Service: Gastroenterology;;   CARDIAC CATHETERIZATION     CHOLECYSTECTOMY     COLONOSCOPY WITH PROPOFOL N/A 06/13/2019   Procedure: COLONOSCOPY WITH PROPOFOL;  Surgeon: [name], MD;  Location: [facility] ENDOSCOPY;  Service: Gastroenterology;  Laterality: N/A;   COLONOSCOPY WITH PROPOFOL N/A 08/12/2019   Procedure: COLONOSCOPY WITH PROPOFOL;  Surgeon: [name], MD;  Location: [facility] ENDOSCOPY;  Service: Gastroenterology;  Laterality: N/A;   COLONOSCOPY WITH PROPOFOL N/A 09/19/2019   Procedure: COLONOSCOPY WITH PROPOFOL;  Surgeon: [name], MD;  Location: [facility] ENDOSCOPY;  Service: Gastroenterology;  Laterality: N/A;   COLONOSCOPY WITH PROPOFOL N/A 06/04/2021   Procedure: COLONOSCOPY WITH PROPOFOL;  Surgeon: [name], MD;  Location[facility]COPY;  Service: Gastroenterology;  Laterality: N/A;   DILATION AND CURETTAGE OF UTERUS     ESOPHAGOGASTRODUODENOSCOPY (EGD) WITH PROPOFOL N/A 06/13/2019   Procedure: ESOPHAGOGASTRODUODENOSCOPY (EGD) WITH PROPOFOL;  Surgeon: [name], MD;  Location: [facility] ENDOSCOPY;  Service: Gastroenterology;  Laterality: N/A;  Pt will go for COVID test on 37-6-28 as she uses public transportation and will be in the area on that day.    ESOPHAGOGASTRODUODENOSCOPY (  EGD) WITH PROPOFOL N/A 10/31/2019   Procedure: ESOPHAGOGASTRODUODENOSCOPY (EGD) WITH PROPOFOL;  Surgeon: Rush Landmark Telford Nab., MD;  Location: [place];  Service: Gastroenterology;  Laterality: N/A;   ESOPHAGOGASTRODUODENOSCOPY (EGD) WITH PROPOFOL N/A 06/04/2021   Procedure: ESOPHAGOGASTRODUODENOSCOPY (EGD) WITH PROPOFOL;  Surgeon: [name], MD;  Location: [facility] ENDOSCOPY;  Service: Gastroenterology;  Laterality:  N/A;   HEMOSTASIS CLIP PLACEMENT  10/31/2019   Procedure: HEMOSTASIS CLIP PLACEMENT;  Surgeon: Irving Copas., MD;  Location: [place];  Service: Gastroenterology;;   HERNIA REPAIR     HYSTEROSCOPY WITH D & C N/A 02/16/2017   Procedure: DILATATION AND CURETTAGE /HYSTEROSCOPY;  Surgeon: Brayton Mars, MD;  Location: [facility] ORS;  Service: Gynecology;  Laterality: N/A;   JOINT REPLACEMENT Left    knee   kidney removal Left    KIDNEY SURGERY Left 1979   left kidney removed   LEFT HEART CATH AND CORONARY ANGIOGRAPHY N/A 10/21/2017   Procedure: LEFT HEART CATH AND CORONARY ANGIOGRAPHY;  Surgeon: Teodoro Spray, MD;  Location: [place] [facility];  Service: Cardiovascular;  Laterality: N/A;   POLYPECTOMY  10/31/2019   Procedure: POLYPECTOMY;  Surgeon: Rush Landmark Telford Nab., MD;  Location: [place];  Service: Gastroenterology;;   TOTAL KNEE ARTHROPLASTY Left 07/06/2017   Procedure: TOTAL KNEE ARTHROPLASTY;  Surgeon: Lovell Sheehan, MD;  Location: [facility] ORS;  Service: Orthopedics;  Laterality: Left;   TOTAL KNEE ARTHROPLASTY Right 02/25/2021   Procedure: TOTAL KNEE ARTHROPLASTY;  Surgeon: Lovell Sheehan, MD;  Location: [facility] ORS;  Service: Orthopedics;  Laterality: Right;   UPPER ESOPHAGEAL ENDOSCOPIC ULTRASOUND (EUS) N/A 10/31/2019   Procedure: UPPER ESOPHAGEAL ENDOSCOPIC ULTRASOUND (EUS);  Surgeon: Irving Copas., MD;  Location: [place];  Service: Gastroenterology;  Laterality: N/A;   WRIST ARTHROSCOPY Left 1970s   XI ROBOTIC ASSISTED VENTRAL HERNIA N/A 12/01/2019   Procedure: XI ROBOTIC ASSISTED VENTRAL HERNIA;  Surgeon: Jules Husbands, MD;  Location: [facility] ORS;  Service: General;  Laterality: N/A;    Family History  Problem Relation Age of Onset   Ovarian cancer Mother    Hypertension Father    Diabetes Father    Cancer Father    Breast cancer Sister    Diabetes Sister     Social History:  reports that she has never smoked. She has never used smokeless  tobacco. She reports current alcohol use of about 1.0 standard drink of alcohol per week. She reports current drug use. Frequency: 3.00 times per week. Drug: Marijuana.  Allergies:  Allergies  Allergen Reactions   Enalapril Swelling and Other (See Comments)   Lisinopril Swelling   Ace Inhibitors Swelling    And ARB    Medications reviewed.   ROS Full ROS performed and is otherwise negative other than what is stated in the HPI    BP (!) 160/97   Pulse 72   Temp 98.2 F (36.8 C) (Oral)   Ht '5\' 5"'$  (1.651 m)   Wt 244 lb (110.7 kg)   LMP 01/26/2017 Comment: age 60  SpO2 97%   BMI 40.60 kg/m   Physical Exam Vitals and nursing note reviewed. Exam conducted with a chaperone present.  Constitutional:      General: She is not in acute distress.    Appearance: Normal appearance. She is obese.  Cardiovascular:     Rate and Rhythm: Normal rate and regular rhythm.     Heart sounds:     No friction rub.  Pulmonary:     Effort: Pulmonary effort is  normal. No respiratory distress.     Breath sounds: Normal breath sounds. No stridor.  Abdominal:     General: There is no distension.     Palpations: There is no mass.     Tenderness: There is abdominal tenderness. There is no guarding or rebound.     Hernia: No hernia is present.  Musculoskeletal:        General: No swelling or tenderness. Normal range of motion.     Cervical back: Normal range of motion and neck supple. No rigidity or tenderness.  Skin:    General: Skin is warm and dry.     Capillary Refill: Capillary refill takes less than 2 seconds.  Neurological:     General: No focal deficit present.     Mental Status: She is alert and oriented to person, place, and time.  Psychiatric:        Mood and Affect: Mood normal.        Behavior: Behavior normal.        Thought Content: Thought content normal.        Judgment: Judgment normal.     Assessment/Plan: Recurrent diverticulitis of the colon.  Discussed with the  patient in detail about her disease process.  He has had multiple episodes and we talked about potentially elective sigmoid colectomy in the setting.  I do think that we need updated imaging since she is still having some intermittent abdominal pain.  Addressed again the importance of adhering with medical recommendations I will see her back once she completes her appropriate work-up and revisit further options for management of diverticulitis Note that I spent 40 minutes in this encounter including personally reviewing previous imaging studies, endoscopic images, counseling the patient, placing orders and performing appropriate documentation  Caroleen Hamman, MD [place] Surgeon

## 2022-02-21 ENCOUNTER — Ambulatory Visit
Admission: RE | Admit: 2022-02-21 | Discharge: 2022-02-21 | Disposition: A | Discharge status: Home / Self Care | Payer: Medicare Other | Source: Ambulatory Visit | Attending: Surgery | Admitting: Surgery

## 2022-02-21 DIAGNOSIS — K839 Disease of biliary tract, unspecified: Secondary | ICD-10-CM | POA: Diagnosis not present

## 2022-02-21 DIAGNOSIS — K5732 Diverticulitis of large intestine without perforation or abscess without bleeding: Secondary | ICD-10-CM | POA: Diagnosis not present

## 2022-02-21 DIAGNOSIS — K5730 Diverticulosis of large intestine without perforation or abscess without bleeding: Secondary | ICD-10-CM | POA: Diagnosis not present

## 2022-02-21 DIAGNOSIS — K7689 Other specified diseases of liver: Secondary | ICD-10-CM | POA: Diagnosis not present

## 2022-02-21 LAB — POCT I-STAT CREATININE: Creatinine, Ser: 1.3 mg/dL — ABNORMAL HIGH (ref 0.44–1.00)

## 2022-02-21 MED ORDER — IOHEXOL 300 MG/ML  SOLN
100.0000 mL | Freq: Once | INTRAMUSCULAR | Status: DC | PRN
Start: 2022-02-21 — End: 2022-02-21

## 2022-02-21 MED ORDER — IOHEXOL 300 MG/ML  SOLN
80.0000 mL | Freq: Once | INTRAMUSCULAR | Status: AC | PRN
  Administered 2022-02-21: 80 mL via INTRAVENOUS

## 2022-02-24 NOTE — Progress Notes (Signed)
No significant abnormalities no diverticulitis

## 2022-02-26 ENCOUNTER — Encounter: Payer: Medicare Other | Admitting: Obstetrics and Gynecology

## 2022-02-26 ENCOUNTER — Ambulatory Visit: Payer: Medicare Other | Admitting: Surgery

## 2022-02-27 ENCOUNTER — Telehealth: Payer: Self-pay

## 2022-02-27 NOTE — Telephone Encounter (Signed)
Called patient and had to leave her a detailed message letting her know that her CT Scan had no abnormalities and no diverticulitis. I told her that if she had further questions, to please call us back.

## 2022-02-27 NOTE — Telephone Encounter (Signed)
-----   Message from [name], MD sent at 02/24/2022  7:41 AM EDT ----- No significant abnormalities no diverticulitis

## 2022-03-17 ENCOUNTER — Ambulatory Visit: Payer: Medicare Other | Admitting: Surgery

## 2022-03-26 ENCOUNTER — Encounter: Payer: Self-pay | Admitting: Nurse Practitioner

## 2022-03-26 ENCOUNTER — Ambulatory Visit (INDEPENDENT_AMBULATORY_CARE_PROVIDER_SITE_OTHER): Payer: Medicare Other | Admitting: Nurse Practitioner

## 2022-03-26 VITALS — BP 160/85 | HR 80 | Temp 97.7°F | Resp 16 | Ht 65.0 in | Wt 246.6 lb

## 2022-03-26 DIAGNOSIS — I480 Paroxysmal atrial fibrillation: Secondary | ICD-10-CM

## 2022-03-26 DIAGNOSIS — E782 Mixed hyperlipidemia: Secondary | ICD-10-CM

## 2022-03-26 DIAGNOSIS — E538 Deficiency of other specified B group vitamins: Secondary | ICD-10-CM

## 2022-03-26 DIAGNOSIS — I700 Atherosclerosis of aorta: Secondary | ICD-10-CM

## 2022-03-26 DIAGNOSIS — Z760 Encounter for issue of repeat prescription: Secondary | ICD-10-CM | POA: Diagnosis not present

## 2022-03-26 DIAGNOSIS — I10 Essential (primary) hypertension: Secondary | ICD-10-CM

## 2022-03-26 DIAGNOSIS — E559 Vitamin D deficiency, unspecified: Secondary | ICD-10-CM

## 2022-03-26 DIAGNOSIS — H5213 Myopia, bilateral: Secondary | ICD-10-CM | POA: Diagnosis not present

## 2022-03-26 DIAGNOSIS — J4520 Mild intermittent asthma, uncomplicated: Secondary | ICD-10-CM

## 2022-03-26 MED ORDER — HYDRALAZINE HCL 25 MG PO TABS: 25.0000 mg | ORAL_TABLET | Freq: Three times a day (TID) | ORAL | 3 refills | Status: DC

## 2022-03-26 MED ORDER — EZETIMIBE 10 MG PO TABS: 10.0000 mg | ORAL_TABLET | Freq: Every day | ORAL | 3 refills | Status: DC

## 2022-03-26 MED ORDER — AMLODIPINE BESYLATE 5 MG PO TABS: 5.0000 mg | ORAL_TABLET | Freq: Every day | ORAL | 3 refills | Status: DC

## 2022-03-26 MED ORDER — ALBUTEROL SULFATE HFA 108 (90 BASE) MCG/ACT IN AERS: 2.0000 | INHALATION_SPRAY | RESPIRATORY_TRACT | 3 refills | Status: AC | PRN

## 2022-03-26 NOTE — Progress Notes (Signed)
[facility] [place], [place] [address]  Internal MEDICINE  Office Visit Note  Patient Name: Whitney [name]  [id number]  [id number]  Date of Service: 03/26/2022  Chief Complaint  Patient presents with   Follow-up    Requesting labwork to be redone - Also needs Zetia refilled, and Amlodipine changed to 60m   Depression   Anxiety   Quality Metric Gaps    Pneumonia Vaccine    HPI KQuineshapresents for a follow up visit for hypertension, high cholesterol, med refills and wants to repeat labs.  --last office visit was in December last year. BP is significantly elevated today with no significant improvement when rechecked. She has been off BP meds for a while. Has headache with high BP.  --had eye exam this morning which was ok.      Current Medication: Outpatient Encounter Medications as of 03/26/2022  Medication Sig   amLODipine (NORVASC) 5 MG tablet Take 1 tablet (5 mg total) by mouth daily.   aspirin 81 MG chewable tablet Chew 1 tablet (81 mg total) by mouth 2 (two) times daily.   carvedilol (COREG) 12.5 MG tablet Take 1 tablet by mouth 2 (two) times daily.   diclofenac Sodium (VOLTAREN) 1 % GEL Apply 2 g topically daily as needed (Knee pain).   docusate sodium (COLACE) 100 MG capsule Take 1 capsule (100 mg total) by mouth 2 (two) times daily.   gabapentin (NEURONTIN) 100 MG capsule Take 200 mg by mouth 2 (two) times daily.   orlistat (ALLI) 60 MG capsule Take 60 mg by mouth 3 (three) times daily with meals.   [DISCONTINUED] albuterol (VENTOLIN HFA) 108 (90 Base) MCG/ACT inhaler Inhale 2 puffs into the lungs every 4 (four) hours as needed for wheezing or shortness of breath.   [DISCONTINUED] amLODipine (NORVASC) 10 MG tablet TAKE 1 TABLET(10 MG) BY MOUTH DAILY   [DISCONTINUED] ezetimibe (ZETIA) 10 MG tablet Take 1 tablet (10 mg total) by mouth daily. (Patient taking differently: Take 10 mg by mouth every morning.)   [DISCONTINUED] hydrALAZINE (APRESOLINE) 25  MG tablet Take 1 tablet (25 mg total) by mouth 3 (three) times daily. For HTN   albuterol (VENTOLIN HFA) 108 (90 Base) MCG/ACT inhaler Inhale 2 puffs into the lungs every 4 (four) hours as needed for wheezing or shortness of breath.   ezetimibe (ZETIA) 10 MG tablet Take 1 tablet (10 mg total) by mouth daily.   hydrALAZINE (APRESOLINE) 25 MG tablet Take 1 tablet (25 mg total) by mouth 3 (three) times daily. For HTN   No facility-administered encounter medications on file as of 03/26/2022.    Surgical History: Past Surgical History:  Procedure Laterality Date   BIOPSY  10/31/2019   Procedure: BIOPSY;  Surgeon: MRush LandmarkGTelford Nab, MD;  [place]  Service: Gastroenterology;;   CARDIAC CATHETERIZATION     CHOLECYSTECTOMY     COLONOSCOPY WITH PROPOFOL N/A 06/13/2019   Procedure: COLONOSCOPY WITH PROPOFOL;  Surgeon: AJonathon Bellows MD;  [facility];  Service: Gastroenterology;  Laterality: N/A;   COLONOSCOPY WITH PROPOFOL N/A 08/12/2019   Procedure: COLONOSCOPY WITH PROPOFOL;  Surgeon: AJonathon Bellows MD;  [facility];  Service: Gastroenterology;  Laterality: N/A;   COLONOSCOPY WITH PROPOFOL N/A 09/19/2019   Procedure: COLONOSCOPY WITH PROPOFOL;  Surgeon: AJonathon Bellows MD;  [facility];  Service: Gastroenterology;  Laterality: N/A;   COLONOSCOPY WITH PROPOFOL N/A 06/04/2021   Procedure: COLONOSCOPY WITH PROPOFOL;  Surgeon: AJonathon Bellows MD;  [facility]  Service: Gastroenterology;  Laterality: N/A;   DILATION AND CURETTAGE OF UTERUS     ESOPHAGOGASTRODUODENOSCOPY (EGD) WITH PROPOFOL N/A 06/13/2019   Procedure: ESOPHAGOGASTRODUODENOSCOPY (EGD) WITH PROPOFOL;  Surgeon: [name], MD;  Location: [facility];  Service: Gastroenterology;  Laterality: N/A;  Pt will go for COVID test on 20-3-55 as she uses public transportation and will be in the area on that day.    ESOPHAGOGASTRODUODENOSCOPY (EGD) WITH PROPOFOL N/A 10/31/2019   Procedure:  ESOPHAGOGASTRODUODENOSCOPY (EGD) WITH PROPOFOL;  Surgeon: Rush Landmark Telford Nab., MD;  Location: [place];  Service: Gastroenterology;  Laterality: N/A;   ESOPHAGOGASTRODUODENOSCOPY (EGD) WITH PROPOFOL N/A 06/04/2021   Procedure: ESOPHAGOGASTRODUODENOSCOPY (EGD) WITH PROPOFOL;  Surgeon: [name], MD;  [facility];  Service: Gastroenterology;  Laterality: N/A;   HEMOSTASIS CLIP PLACEMENT  10/31/2019   Procedure: HEMOSTASIS CLIP PLACEMENT;  Surgeon: Irving Copas., MD;  Location: [place];  Service: Gastroenterology;;   HERNIA REPAIR     HYSTEROSCOPY WITH D & C N/A 02/16/2017   Procedure: DILATATION AND CURETTAGE /HYSTEROSCOPY;  Surgeon: Brayton Mars, MD;  Location: [facility];  Service: Gynecology;  Laterality: N/A;   JOINT REPLACEMENT Left    knee   kidney removal Left    KIDNEY SURGERY Left 1979   left kidney removed   LEFT HEART CATH AND CORONARY ANGIOGRAPHY N/A 10/21/2017   Procedure: LEFT HEART CATH AND CORONARY ANGIOGRAPHY;  Surgeon: Teodoro Spray, MD;  Location: [place] [facility];  Service: Cardiovascular;  Laterality: N/A;   POLYPECTOMY  10/31/2019   Procedure: POLYPECTOMY;  Surgeon: Rush Landmark Telford Nab., MD;  Location: [place];  Service: Gastroenterology;;   TOTAL KNEE ARTHROPLASTY Left 07/06/2017   Procedure: TOTAL KNEE ARTHROPLASTY;  Surgeon: Lovell Sheehan, MD;  Location: [facility];  Service: Orthopedics;  Laterality: Left;   TOTAL KNEE ARTHROPLASTY Right 02/25/2021   Procedure: TOTAL KNEE ARTHROPLASTY;  Surgeon: Lovell Sheehan, MD;  Location: [facility];  Service: Orthopedics;  Laterality: Right;   UPPER ESOPHAGEAL ENDOSCOPIC ULTRASOUND (EUS) N/A 10/31/2019   Procedure: UPPER ESOPHAGEAL ENDOSCOPIC ULTRASOUND (EUS);  Surgeon: Irving Copas., MD;  Location: [place];  Service: Gastroenterology;  Laterality: N/A;   WRIST ARTHROSCOPY Left 1970s   XI ROBOTIC ASSISTED VENTRAL HERNIA N/A 12/01/2019   Procedure: XI ROBOTIC ASSISTED  VENTRAL HERNIA;  Surgeon: Jules Husbands, MD;  Location: [facility];  Service: General;  Laterality: N/A;    Medical History: Past Medical History:  Diagnosis Date   Abnormal Pap smear of cervix    Pt states she had colposcopy    Anxiety    Aortic atherosclerosis (HCC)    Arthritis    CAD (coronary artery disease)    Chronic kidney disease    s/p LEFT nephrectomy   Chronic lower back pain    COVID    Depression    Diverticulitis    Dyspnea    History of methicillin resistant staphylococcus aureus (MRSA) 12/21/2020   nasal swab pcr   Hx of unilateral nephrectomy 1979   Left Nephrectomy   Hypertension    Lower extremity edema    PAF (paroxysmal atrial fibrillation) (HCC)    Palpitations    PMB (postmenopausal bleeding)    Restless leg syndrome    Sleep apnea    has no cpap   Symptomatic bradycardia    Vitamin D deficiency     Family History: Family History  Problem Relation Age of Onset   Ovarian cancer Mother    Hypertension Father    Diabetes  Father    Cancer Father    Breast cancer Sister    Diabetes Sister     Social History   Socioeconomic History   Marital status: Divorced    Spouse name: Not on file   Number of children: Not on file   Years of education: Not on file   Highest education level: Not on file  Occupational History   Not on file  Tobacco Use   Smoking status: Never   Smokeless tobacco: Never  Vaping Use   Vaping Use: Never used  Substance and Sexual Activity   Alcohol use: Yes    Alcohol/week: 1.0 standard drink of alcohol    Types: 1 Standard drinks or equivalent per week    Comment: occasionally   Drug use: Yes    Frequency: 3.0 times per week    Types: Marijuana    Comment: 07/09/20 Pt states she uses for leg pain: marijuana; states no longer uses cocaine   Sexual activity: Yes    Birth control/protection: None  Other Topics Concern   Not on file  Social History Narrative   Lives with cousin.   Social Determinants of Health    Financial Resource Strain: Low Risk  (03/22/2021)   Overall Financial Resource Strain (CARDIA)    Difficulty of Paying Living Expenses: Not very hard  Food Insecurity: Not on file  Transportation Needs: Not on file  Physical Activity: Not on file  Stress: Not on file  Social Connections: Not on file  Intimate Partner Violence: Not on file      Review of Systems  Constitutional:  Negative for chills, fatigue and unexpected weight change.  HENT:  Negative for congestion, rhinorrhea, sneezing and sore throat.   Eyes:  Negative for redness.  Respiratory: Negative.  Negative for cough, chest tightness, shortness of breath and wheezing.   Cardiovascular: Negative.  Negative for chest pain and palpitations.  Gastrointestinal:  Negative for abdominal pain, constipation, diarrhea, nausea and vomiting.  Genitourinary:  Negative for dysuria and frequency.  Musculoskeletal:  Negative for arthralgias, back pain, joint swelling and neck pain.  Skin:  Negative for rash.  Neurological:  Positive for dizziness and headaches. Negative for tremors and numbness.  Hematological:  Negative for adenopathy. Does not bruise/bleed easily.  Psychiatric/Behavioral:  Negative for behavioral problems (Depression), sleep disturbance and suicidal ideas. The patient is not nervous/anxious.     Vital Signs: BP (!) 160/85 Comment: 161/98  Pulse 80   Temp 97.7 F (36.5 C)   Resp 16   Ht 5' 5"  (1.651 m)   Wt 246 lb 9.6 oz (111.9 kg)   LMP 01/26/2017 Comment: age 71  SpO2 98%   BMI 41.04 kg/m    Physical Exam Vitals reviewed.  Constitutional:      General: She is not in acute distress.    Appearance: Normal appearance. She is obese. She is not ill-appearing.  HENT:     Head: Normocephalic and atraumatic.  Eyes:     Pupils: Pupils are equal, round, and reactive to light.  Cardiovascular:     Rate and Rhythm: Normal rate and regular rhythm.  Pulmonary:     Effort: Pulmonary effort is normal. No  respiratory distress.  Neurological:     Mental Status: She is alert and oriented to person, place, and time.  Psychiatric:        Mood and Affect: Mood normal.        Behavior: Behavior normal.        Assessment/Plan:  1. Essential hypertension Elevated BP, off meds. Restart amlodipine and hydralazine as prescribed, take BP once daily at home if able. Follow up in 4 weeks. Repeat labs ordered as well.  - CBC with Differential/Platelet - CMP14+EGFR - Lipid Profile - TSH + free T4  2. Aortic atherosclerosis (HCC) Routine labs ordered - CMP14+EGFR - Lipid Profile - TSH + free T4  3. Vitamin D deficiency Routine lab ordered - Vitamin D (25 hydroxy)  4. B12 deficiency Repeat labs ordered - CBC with Differential/Platelet - B12 and Folate Panel  5. Medication refill - ezetimibe (ZETIA) 10 MG tablet; Take 1 tablet (10 mg total) by mouth daily.  Dispense: 90 tablet; Refill: 3 - amLODipine (NORVASC) 5 MG tablet; Take 1 tablet (5 mg total) by mouth daily.  Dispense: 90 tablet; Refill: 3 - hydrALAZINE (APRESOLINE) 25 MG tablet; Take 1 tablet (25 mg total) by mouth 3 (three) times daily. For HTN  Dispense: 90 tablet; Refill: 3 - albuterol (VENTOLIN HFA) 108 (90 Base) MCG/ACT inhaler; Inhale 2 puffs into the lungs every 4 (four) hours as needed for wheezing or shortness of breath.  Dispense: 1 each; Refill: 3   General Counseling: Whitney [name] verbalizes understanding of the findings of todays visit and agrees with plan of treatment. I have discussed any further diagnostic evaluation that may be needed or ordered today. We also reviewed her medications today. she has been encouraged to call the office with any questions or concerns that should arise related to todays visit.    Orders Placed This Encounter  Procedures   CBC with Differential/Platelet   CMP14+EGFR   Lipid Profile   TSH + free T4   Vitamin D (25 hydroxy)   B12 and Folate Panel    Meds ordered this encounter   Medications   ezetimibe (ZETIA) 10 MG tablet    Sig: Take 1 tablet (10 mg total) by mouth daily.    Dispense:  90 tablet    Refill:  3    Please d/c atorvastatin due to nausea   amLODipine (NORVASC) 5 MG tablet    Sig: Take 1 tablet (5 mg total) by mouth daily.    Dispense:  90 tablet    Refill:  3   hydrALAZINE (APRESOLINE) 25 MG tablet    Sig: Take 1 tablet (25 mg total) by mouth 3 (three) times daily. For HTN    Dispense:  90 tablet    Refill:  3   albuterol (VENTOLIN HFA) 108 (90 Base) MCG/ACT inhaler    Sig: Inhale 2 puffs into the lungs every 4 (four) hours as needed for wheezing or shortness of breath.    Dispense:  1 each    Refill:  3    Return in about 1 month (around 04/29/2022) for F/U, Labs, BP check, Whitney [name] PCP need appt on 9/26 due to traveling from out of town.   Total time spent:30 Minutes Time spent includes review of chart, medications, test results, and follow up plan with the patient.   [place] Controlled Substance Database was reviewed by me.  This patient was seen by Jonetta Osgood, FNP-C in collaboration with Dr. Clayborn Bigness as a part of collaborative care agreement.   Whitney Algeo R. Valetta Fuller, MSN, FNP-C Internal medicine

## 2022-04-21 DIAGNOSIS — J4520 Mild intermittent asthma, uncomplicated: Secondary | ICD-10-CM | POA: Diagnosis not present

## 2022-04-21 DIAGNOSIS — E559 Vitamin D deficiency, unspecified: Secondary | ICD-10-CM | POA: Diagnosis not present

## 2022-04-21 DIAGNOSIS — I700 Atherosclerosis of aorta: Secondary | ICD-10-CM | POA: Diagnosis not present

## 2022-04-21 DIAGNOSIS — I480 Paroxysmal atrial fibrillation: Secondary | ICD-10-CM | POA: Diagnosis not present

## 2022-04-21 DIAGNOSIS — I10 Essential (primary) hypertension: Secondary | ICD-10-CM | POA: Diagnosis not present

## 2022-04-21 DIAGNOSIS — E782 Mixed hyperlipidemia: Secondary | ICD-10-CM | POA: Diagnosis not present

## 2022-04-22 LAB — B12 AND FOLATE PANEL
Folate: 15.6 ng/mL (ref >3.0)
Vitamin B-12: 956 pg/mL (ref 232–1245)

## 2022-04-22 LAB — CBC WITH DIFFERENTIAL/PLATELET
Basophils Absolute: 0 10*3/uL (ref 0.0–0.2)
Basos: 1 %
EOS (ABSOLUTE): 0.2 10*3/uL (ref 0.0–0.4)
Eos: 5 %
Hematocrit: 37.4 % (ref 34.0–46.6)
Hemoglobin: 12.3 g/dL (ref 11.1–15.9)
Immature Grans (Abs): 0 10*3/uL (ref 0.0–0.1)
Immature Granulocytes: 0 %
Lymphocytes Absolute: 2.1 10*3/uL (ref 0.7–3.1)
Lymphs: 42 %
MCH: 29.9 pg (ref 26.6–33.0)
MCHC: 32.9 g/dL (ref 31.5–35.7)
MCV: 91 fL (ref 79–97)
Monocytes Absolute: 0.4 10*3/uL (ref 0.1–0.9)
Monocytes: 7 %
Neutrophils Absolute: 2.2 10*3/uL (ref 1.4–7.0)
Neutrophils: 45 %
Platelets: 300 10*3/uL (ref 150–450)
RBC: 4.11 x10E6/uL (ref 3.77–5.28)
RDW: 13 % (ref 11.7–15.4)
WBC: 4.9 10*3/uL (ref 3.4–10.8)

## 2022-04-22 LAB — CMP14+EGFR
ALT: 11 IU/L (ref 0–32)
AST: 17 IU/L (ref 0–40)
Albumin/Globulin Ratio: 1.3 (ref 1.2–2.2)
Albumin: 3.8 g/dL (ref 3.8–4.8)
Alkaline Phosphatase: 82 IU/L (ref 44–121)
BUN/Creatinine Ratio: 17 (ref 12–28)
BUN: 24 mg/dL (ref 8–27)
Bilirubin Total: 0.3 mg/dL (ref 0.0–1.2)
CO2: 24 mmol/L (ref 20–29)
Calcium: 9.1 mg/dL (ref 8.7–10.3)
Chloride: 107 mmol/L — ABNORMAL HIGH (ref 96–106)
Creatinine, Ser: 1.42 mg/dL — ABNORMAL HIGH (ref 0.57–1.00)
Globulin, Total: 2.9 g/dL (ref 1.5–4.5)
Glucose: 83 mg/dL (ref 70–99)
Potassium: 5.2 mmol/L (ref 3.5–5.2)
Sodium: 144 mmol/L (ref 134–144)
Total Protein: 6.7 g/dL (ref 6.0–8.5)
eGFR: 40 mL/min/{1.73_m2} — ABNORMAL LOW (ref >59)

## 2022-04-22 LAB — TSH+FREE T4
Free T4: 0.82 ng/dL (ref 0.82–1.77)
TSH: 1.7 u[IU]/mL (ref 0.450–4.500)

## 2022-04-22 LAB — LIPID PANEL
Chol/HDL Ratio: 4.7 ratio — ABNORMAL HIGH (ref 0.0–4.4)
Cholesterol, Total: 368 mg/dL — ABNORMAL HIGH (ref 100–199)
HDL: 78 mg/dL (ref >39)
LDL Chol Calc (NIH): 273 mg/dL — ABNORMAL HIGH (ref 0–99)
Triglycerides: 104 mg/dL (ref 0–149)
VLDL Cholesterol Cal: 17 mg/dL (ref 5–40)

## 2022-04-22 LAB — VITAMIN D 25 HYDROXY (VIT D DEFICIENCY, FRACTURES): Vit D, 25-Hydroxy: 12.9 ng/mL — ABNORMAL LOW (ref 30.0–100.0)

## 2022-04-29 ENCOUNTER — Ambulatory Visit (INDEPENDENT_AMBULATORY_CARE_PROVIDER_SITE_OTHER): Payer: Medicare Other | Admitting: Nurse Practitioner

## 2022-04-29 ENCOUNTER — Other Ambulatory Visit: Payer: Self-pay | Admitting: Nurse Practitioner

## 2022-04-29 ENCOUNTER — Encounter: Payer: Self-pay | Admitting: Obstetrics and Gynecology

## 2022-04-29 ENCOUNTER — Encounter: Payer: Self-pay | Admitting: Nurse Practitioner

## 2022-04-29 ENCOUNTER — Ambulatory Visit (INDEPENDENT_AMBULATORY_CARE_PROVIDER_SITE_OTHER): Payer: Medicare Other | Admitting: Obstetrics and Gynecology

## 2022-04-29 VITALS — BP 150/80 | HR 70 | Temp 96.5°F | Resp 16 | Ht 65.0 in | Wt 250.4 lb

## 2022-04-29 VITALS — BP 149/87 | HR 64 | Ht 65.0 in | Wt 250.6 lb

## 2022-04-29 DIAGNOSIS — I10 Essential (primary) hypertension: Secondary | ICD-10-CM

## 2022-04-29 DIAGNOSIS — E559 Vitamin D deficiency, unspecified: Secondary | ICD-10-CM

## 2022-04-29 DIAGNOSIS — N1832 Chronic kidney disease, stage 3b: Secondary | ICD-10-CM

## 2022-04-29 DIAGNOSIS — Z01419 Encounter for gynecological examination (general) (routine) without abnormal findings: Secondary | ICD-10-CM | POA: Diagnosis not present

## 2022-04-29 DIAGNOSIS — I700 Atherosclerosis of aorta: Secondary | ICD-10-CM | POA: Diagnosis not present

## 2022-04-29 DIAGNOSIS — Z23 Encounter for immunization: Secondary | ICD-10-CM

## 2022-04-29 DIAGNOSIS — Z1231 Encounter for screening mammogram for malignant neoplasm of breast: Secondary | ICD-10-CM | POA: Diagnosis not present

## 2022-04-29 MED ORDER — ROSUVASTATIN CALCIUM 20 MG PO TABS: 20.0000 mg | ORAL_TABLET | Freq: Every day | ORAL | 2 refills | Status: DC

## 2022-04-29 MED ORDER — VITAMIN D (ERGOCALCIFEROL) 1.25 MG (50000 UNIT) PO CAPS: 50000.0000 [IU] | ORAL_CAPSULE | ORAL | 5 refills | Status: DC

## 2022-04-29 MED ORDER — ROSUVASTATIN CALCIUM 10 MG PO TABS: 10.0000 mg | ORAL_TABLET | Freq: Every day | ORAL | 0 refills | Status: DC

## 2022-04-29 NOTE — Progress Notes (Signed)
HPI:      [name] is a 71 y.o. G2P1011 who LMP was Patient's last menstrual period was 01/26/2017.  Subjective:   She presents today for her annual examination.  She reports that she is generally doing well.  She has no GYN complaints.  She remains sexually active. In the last year she has had some housing insecurity and this has been somewhat of a stressful time for her.  She says things are starting to calm down now and she feels better about her situation.    Hx: The following portions of the patient's history were reviewed and updated as appropriate:             She  has a past medical history of Abnormal Pap smear of cervix, Anxiety, Aortic atherosclerosis ([place]), Arthritis, CAD (coronary artery disease), Chronic kidney disease, Chronic lower back pain, COVID, Depression, Diverticulitis, Dyspnea, History of methicillin resistant staphylococcus aureus (MRSA) (12/21/2020), unilateral nephrectomy (1979), Hypertension, Lower extremity edema, PAF (paroxysmal atrial fibrillation) ([place]), Palpitations, PMB (postmenopausal bleeding), Restless leg syndrome, Sleep apnea, Symptomatic bradycardia, and Vitamin D deficiency. She does not have any pertinent problems on file. She  has a past surgical history that includes Kidney surgery (Left, 1979); Wrist arthroscopy (Left, 1970s); Hysteroscopy with D & C (N/A, 02/16/2017); LEFT HEART CATH AND CORONARY ANGIOGRAPHY (N/A, 10/21/2017); Total knee arthroplasty (Left, 07/06/2017); kidney removal (Left); Cholecystectomy; Colonoscopy with propofol (N/A, 06/13/2019); Esophagogastroduodenoscopy (egd) with propofol (N/A, 06/13/2019); Cardiac catheterization; Colonoscopy with propofol (N/A, 08/12/2019); Colonoscopy with propofol (N/A, 09/19/2019); Upper esophageal endoscopic ultrasound (eus) (N/A, 10/31/2019); Esophagogastroduodenoscopy (egd) with propofol (N/A, 10/31/2019); polypectomy (10/31/2019); biopsy (10/31/2019); Hemostasis clip placement (10/31/2019); Dilation and  curettage of uterus; XI robotic assisted ventral hernia (N/A, 12/01/2019); Joint replacement (Left); Hernia repair; Total knee arthroplasty (Right, 02/25/2021); Colonoscopy with propofol (N/A, 06/04/2021); and Esophagogastroduodenoscopy (egd) with propofol (N/A, 06/04/2021). Her family history includes Breast cancer in her sister; Cancer in her father; Diabetes in her father and sister; Hypertension in her father; Ovarian cancer in her mother. She  reports that she has never smoked. She has never used smokeless tobacco. She reports current alcohol use of about 1.0 standard drink of alcohol per week. She reports current drug use. Frequency: 3.00 times per week. Drug: Marijuana. She has a current medication list which includes the following prescription(s): albuterol, amlodipine, aspirin, carvedilol, diclofenac sodium, docusate sodium, ezetimibe, gabapentin, hydralazine, orlistat, rosuvastatin, [START ON 05/20/2022] rosuvastatin, and vitamin d (ergocalciferol). She is allergic to enalapril, lisinopril, and ace inhibitors.       Review of Systems:  Review of Systems  Constitutional: Denied constitutional symptoms, night sweats, recent illness, fatigue, fever, insomnia and weight loss.  Eyes: Denied eye symptoms, eye pain, photophobia, vision change and visual disturbance.  Ears/Nose/Throat/Neck: Denied ear, nose, throat or neck symptoms, hearing loss, nasal discharge, sinus congestion and sore throat.  Cardiovascular: Denied cardiovascular symptoms, arrhythmia, chest pain/pressure, edema, exercise intolerance, orthopnea and palpitations.  Respiratory: Denied pulmonary symptoms, asthma, pleuritic pain, productive sputum, cough, dyspnea and wheezing.  Gastrointestinal: Denied, gastro-esophageal reflux, melena, nausea and vomiting.  Genitourinary: Denied genitourinary symptoms including symptomatic vaginal discharge, pelvic relaxation issues, and urinary complaints.  Musculoskeletal: Denied musculoskeletal  symptoms, stiffness, swelling, muscle weakness and myalgia.  Dermatologic: Denied dermatology symptoms, rash and scar.  Neurologic: Denied neurology symptoms, dizziness, headache, neck pain and syncope.  Psychiatric: Denied psychiatric symptoms, anxiety and depression.  Endocrine: Denied endocrine symptoms including hot flashes and night sweats.   Meds:   Current Outpatient Medications on File Prior  to Visit  Medication Sig Dispense Refill   albuterol (VENTOLIN HFA) 108 (90 Base) MCG/ACT inhaler Inhale 2 puffs into the lungs every 4 (four) hours as needed for wheezing or shortness of breath. 1 each 3   amLODipine (NORVASC) 5 MG tablet Take 1 tablet (5 mg total) by mouth daily. 90 tablet 3   aspirin 81 MG chewable tablet Chew 1 tablet (81 mg total) by mouth 2 (two) times daily. 60 tablet 0   carvedilol (COREG) 12.5 MG tablet Take 1 tablet by mouth 2 (two) times daily.     diclofenac Sodium (VOLTAREN) 1 % GEL Apply 2 g topically daily as needed (Knee pain).     docusate sodium (COLACE) 100 MG capsule Take 1 capsule (100 mg total) by mouth 2 (two) times daily. 20 capsule 0   ezetimibe (ZETIA) 10 MG tablet Take 1 tablet (10 mg total) by mouth daily. 90 tablet 3   gabapentin (NEURONTIN) 100 MG capsule Take 200 mg by mouth 2 (two) times daily.     hydrALAZINE (APRESOLINE) 25 MG tablet Take 1 tablet (25 mg total) by mouth 3 (three) times daily. For HTN 90 tablet 3   orlistat (ALLI) 60 MG capsule Take 60 mg by mouth 3 (three) times daily with meals.     No current facility-administered medications on file prior to visit.     Objective:     Vitals:   04/29/22 1111  BP: (!) 149/87  Pulse: 64    Filed Weights   04/29/22 1111  Weight: 250 lb 9.6 oz (113.7 kg)              Physical examination General NAD, Conversant  HEENT Atraumatic; Op clear with mmm.  Normo-cephalic. Pupils reactive. Anicteric sclerae  Thyroid/Neck Smooth without nodularity or enlargement. Normal ROM.  Neck Supple.   Skin No rashes, lesions or ulceration. Normal palpated skin turgor. No nodularity.  Breasts: No masses or discharge.  Symmetric.  No axillary adenopathy.  Lungs: Clear to auscultation.No rales or wheezes. Normal Respiratory effort, no retractions.  Heart: NSR.  No murmurs or rubs appreciated. No periferal edema  Abdomen: Soft.  Non-tender.  No masses.  No HSM. No hernia  Extremities: Moves all appropriately.  Normal ROM for age. No lymphadenopathy.  Neuro: Oriented to PPT.  Normal mood. Normal affect.     Pelvic:   Vulva: Normal appearance.  No lesions.  Vagina: No lesions or abnormalities noted.  Support: Normal pelvic support.  Urethra No masses tenderness or scarring.  Meatus Normal size without lesions or prolapse.  Cervix: Normal appearance.  No lesions.  Anus: Normal exam.  No lesions.  Perineum: Normal exam.  No lesions.        Bimanual   Uterus: Normal size.  Non-tender.  Mobile.  AV.  Adnexae: No masses.  Non-tender to palpation.  Cul-de-sac: Negative for abnormality.     Assessment:    G2P1011 Patient Active Problem List   Diagnosis Date Noted   History of total knee arthroplasty, right 02/25/2021   Intestinal metaplasia of gastric cardia 11/07/2020   Mild intermittent asthma without complication 50/04/3817   Shortness of breath 08/08/2020   Vaginal candidiasis 08/08/2020   Atherosclerosis of aorta ([place]) 07/09/2020   Acute diverticulitis 05/13/2020   Fever blister 10/15/2019   Cerumen in auditory canal on examination 10/15/2019   Bilateral hearing loss 10/15/2019   Need for vaccination against Streptococcus pneumoniae using pneumococcal conjugate vaccine 7 10/15/2019   Encounter for screening mammogram for malignant neoplasm  of breast 10/15/2019   Colon cancer screening 10/15/2019   Weakness 09/02/2019   Headache disorder 09/02/2019   Stage 3b chronic kidney disease ([place]) 08/10/2019   Elevated erythrocyte sedimentation rate 11/02/2018   Lumbar spondylosis  11/02/2018   PVC's (premature ventricular contractions) 10/18/2018   Primary osteoarthritis of right knee 09/15/2018   Encounter for pre-operative examination 09/15/2018   Obstructive sleep apnea 09/15/2018   Calculus of gallbladder without cholecystitis without obstruction 09/15/2018   Calculus of gallbladder with cholecystitis without biliary obstruction 01/27/2018   Restless leg syndrome 01/27/2018   Generalized abdominal pain 01/11/2018   Cyst of pancreas 01/11/2018   Other specified disorders of kidney and ureter 01/11/2018   Primary insomnia 01/11/2018   Paroxysmal atrial fibrillation ([place]) 11/12/2017   Essential hypertension 10/25/2017   Mixed hyperlipidemia 10/25/2017   Low back pain at multiple sites 10/25/2017   Dysuria 10/25/2017   Encounter for general adult medical examination with abnormal findings 10/25/2017   Vitamin D deficiency 10/25/2017   Near syncope    Symptomatic bradycardia 10/19/2017   Carpal tunnel syndrome 10/12/2017   Primary osteoarthritis of left knee 07/09/2017   History of total knee arthroplasty 07/06/2017   Chest pain 06/29/2017   Postop check 02/25/2017   Impingement syndrome of shoulder region 01/20/2017   Neck pain 01/20/2017   Obesity (BMI 35.0-39.9 without comorbidity) 07/15/2016   Endometrial polyp 07/15/2016   Asymptomatic postmenopausal state 07/15/2016   Morbid obesity ([place]) 07/15/2016   Family history of breast cancer in first degree relative 07/15/2016   Family history of ovarian cancer 07/15/2016   Knee pain 07/04/2016   Bilateral leg pain 06/24/2016     1. Well woman exam with routine gynecological exam   2. Screening mammogram for breast cancer   3. Morbid obesity ([place])        Plan:            Basic Screening Recommendations The basic screening recommendations for asymptomatic women were discussed with the patient during her visit.  The age-appropriate recommendations were discussed with her and the rational for the  tests reviewed.  When I am informed by the patient that another primary care physician has previously obtained the age-appropriate tests and they are up-to-date, only outstanding tests are ordered and referrals given as necessary.  Abnormal results of tests will be discussed with her when all of her results are completed.  Routine preventative health maintenance measures emphasized: Exercise/Diet/Weight control, Tobacco Warnings, Alcohol/Substance use risks and Stress Management  Mammogram ordered Orders Orders Placed This Encounter  Procedures   MM DIGITAL SCREENING BILATERAL    No orders of the defined types were placed in this encounter.         F/U  Return in about 1 year (around 04/30/2023) for Annual Physical.  Finis Bud, M.D. 04/29/2022 12:17 PM

## 2022-04-29 NOTE — Progress Notes (Signed)
[facility] [place], [place] [address]  Internal MEDICINE  Office Visit Note  Patient Name: Whitney [name]  [id number]  [id number]  Date of Service: 04/29/2022  Chief Complaint  Patient presents with   Follow-up    Follow up bp check and review labs.     HPI Celines presents for a follow up visit for hypertension and to review lab results.  Hypertension -- BP elevated, lost place to live, section 8 application denied, have to reapply Hyperlipidemia  -- total cholesterol 368, LDL 273. Taking zetia, used to take atorvastatin, not currently on any statin right now Vitamin D deficiency -- significantly low -- 12.9 Request flu shot CKD stage 3b -- has not seen kidney doctor since last year     Current Medication: Outpatient Encounter Medications as of 04/29/2022  Medication Sig   albuterol (VENTOLIN HFA) 108 (90 Base) MCG/ACT inhaler Inhale 2 puffs into the lungs every 4 (four) hours as needed for wheezing or shortness of breath.   amLODipine (NORVASC) 5 MG tablet Take 1 tablet (5 mg total) by mouth daily.   aspirin 81 MG chewable tablet Chew 1 tablet (81 mg total) by mouth 2 (two) times daily.   carvedilol (COREG) 12.5 MG tablet Take 1 tablet by mouth 2 (two) times daily.   diclofenac Sodium (VOLTAREN) 1 % GEL Apply 2 g topically daily as needed (Knee pain).   docusate sodium (COLACE) 100 MG capsule Take 1 capsule (100 mg total) by mouth 2 (two) times daily.   ezetimibe (ZETIA) 10 MG tablet Take 1 tablet (10 mg total) by mouth daily.   gabapentin (NEURONTIN) 100 MG capsule Take 200 mg by mouth 2 (two) times daily.   hydrALAZINE (APRESOLINE) 25 MG tablet Take 1 tablet (25 mg total) by mouth 3 (three) times daily. For HTN   orlistat (ALLI) 60 MG capsule Take 60 mg by mouth 3 (three) times daily with meals.   rosuvastatin (CRESTOR) 10 MG tablet Take 1 tablet (10 mg total) by mouth daily.   [START ON 05/20/2022] rosuvastatin (CRESTOR) 20 MG tablet Take 1 tablet  (20 mg total) by mouth daily.   Vitamin D, Ergocalciferol, (DRISDOL) 1.25 MG (50000 UNIT) CAPS capsule Take 1 capsule (50,000 Units total) by mouth every 7 (seven) days.   No facility-administered encounter medications on file as of 04/29/2022.    Surgical History: Past Surgical History:  Procedure Laterality Date   BIOPSY  10/31/2019   Procedure: BIOPSY;  Surgeon: Rush Landmark Telford Nab., MD;  Location: [place];  Service: Gastroenterology;;   CARDIAC CATHETERIZATION     CHOLECYSTECTOMY     COLONOSCOPY WITH PROPOFOL N/A 06/13/2019   Procedure: COLONOSCOPY WITH PROPOFOL;  Surgeon: [name], MD;  Location: [facility] ENDOSCOPY;  Service: Gastroenterology;  Laterality: N/A;   COLONOSCOPY WITH PROPOFOL N/A 08/12/2019   Procedure: COLONOSCOPY WITH PROPOFOL;  Surgeon: [name], MD;  Location: [facility] ENDOSCOPY;  Service: Gastroenterology;  Laterality: N/A;   COLONOSCOPY WITH PROPOFOL N/A 09/19/2019   Procedure: COLONOSCOPY WITH PROPOFOL;  Surgeon: [name], MD;  Location: [facility] ENDOSCOPY;  Service: Gastroenterology;  Laterality: N/A;   COLONOSCOPY WITH PROPOFOL N/A 06/04/2021   Procedure: COLONOSCOPY WITH PROPOFOL;  Surgeon: [name], MD;  Location: [facility] ENDOSCOPY;  Service: Gastroenterology;  Laterality: N/A;   DILATION AND CURETTAGE OF UTERUS     ESOPHAGOGASTRODUODENOSCOPY (EGD) WITH PROPOFOL N/A 06/13/2019   Procedure: ESOPHAGOGASTRODUODENOSCOPY (EGD) WITH PROPOFOL;  Surgeon: [name], MD;  Location: [facility] ENDOSCOPY;  Service: Gastroenterology;  Laterality: N/A;  Pt will go for COVID test on 29-9-24 as she uses public transportation and will be in the area on that day.    ESOPHAGOGASTRODUODENOSCOPY (EGD) WITH PROPOFOL N/A 10/31/2019   Procedure: ESOPHAGOGASTRODUODENOSCOPY (EGD) WITH PROPOFOL;  Surgeon: Rush Landmark Telford Nab., MD;  Location: [place];  Service: Gastroenterology;  Laterality: N/A;   ESOPHAGOGASTRODUODENOSCOPY (EGD) WITH PROPOFOL N/A 06/04/2021   Procedure:  ESOPHAGOGASTRODUODENOSCOPY (EGD) WITH PROPOFOL;  Surgeon: [name], MD;  Location: [facility] ENDOSCOPY;  Service: Gastroenterology;  Laterality: N/A;   HEMOSTASIS CLIP PLACEMENT  10/31/2019   Procedure: HEMOSTASIS CLIP PLACEMENT;  Surgeon: Irving Copas., MD;  Location: [place];  Service: Gastroenterology;;   HERNIA REPAIR     HYSTEROSCOPY WITH D & C N/A 02/16/2017   Procedure: DILATATION AND CURETTAGE /HYSTEROSCOPY;  Surgeon: Brayton Mars, MD;  Location: [facility];  Service: Gynecology;  Laterality: N/A;   JOINT REPLACEMENT Left    knee   kidney removal Left    KIDNEY SURGERY Left 1979   left kidney removed   LEFT HEART CATH AND CORONARY ANGIOGRAPHY N/A 10/21/2017   Procedure: LEFT HEART CATH AND CORONARY ANGIOGRAPHY;  Surgeon: Teodoro Spray, MD;  Location: [place] CV LAB;  Service: Cardiovascular;  Laterality: N/A;   POLYPECTOMY  10/31/2019   Procedure: POLYPECTOMY;  Surgeon: Rush Landmark Telford Nab., MD;  Location: [place];  Service: Gastroenterology;;   TOTAL KNEE ARTHROPLASTY Left 07/06/2017   Procedure: TOTAL KNEE ARTHROPLASTY;  Surgeon: Lovell Sheehan, MD;  Location: [facility];  Service: Orthopedics;  Laterality: Left;   TOTAL KNEE ARTHROPLASTY Right 02/25/2021   Procedure: TOTAL KNEE ARTHROPLASTY;  Surgeon: Lovell Sheehan, MD;  Location: [facility];  Service: Orthopedics;  Laterality: Right;   UPPER ESOPHAGEAL ENDOSCOPIC ULTRASOUND (EUS) N/A 10/31/2019   Procedure: UPPER ESOPHAGEAL ENDOSCOPIC ULTRASOUND (EUS);  Surgeon: Irving Copas., MD;  Location: [place];  Service: Gastroenterology;  Laterality: N/A;   WRIST ARTHROSCOPY Left 1970s   XI ROBOTIC ASSISTED VENTRAL HERNIA N/A 12/01/2019   Procedure: XI ROBOTIC ASSISTED VENTRAL HERNIA;  Surgeon: Jules Husbands, MD;  Location: [facility];  Service: General;  Laterality: N/A;    Medical History: Past Medical History:  Diagnosis Date   Abnormal Pap smear of cervix    Pt states she had colposcopy     Anxiety    Aortic atherosclerosis (HCC)    Arthritis    CAD (coronary artery disease)    Chronic kidney disease    s/p LEFT nephrectomy   Chronic lower back pain    COVID    Depression    Diverticulitis    Dyspnea    History of methicillin resistant staphylococcus aureus (MRSA) 12/21/2020   nasal swab pcr   Hx of unilateral nephrectomy 1979   Left Nephrectomy   Hypertension    Lower extremity edema    PAF (paroxysmal atrial fibrillation) (HCC)    Palpitations    PMB (postmenopausal bleeding)    Restless leg syndrome    Sleep apnea    has no cpap   Symptomatic bradycardia    Vitamin D deficiency     Family History: Family History  Problem Relation Age of Onset   Ovarian cancer Mother    Hypertension Father    Diabetes Father    Cancer Father    Breast cancer Sister    Diabetes Sister     Social History   Socioeconomic History   Marital status: Divorced    Spouse name: Not on file   Number of children:  Not on file   Years of education: Not on file   Highest education level: Not on file  Occupational History   Not on file  Tobacco Use   Smoking status: Never   Smokeless tobacco: Never  Vaping Use   Vaping Use: Never used  Substance and Sexual Activity   Alcohol use: Yes    Alcohol/week: 1.0 standard drink of alcohol    Types: 1 Standard drinks or equivalent per week    Comment: occasionally   Drug use: Yes    Frequency: 3.0 times per week    Types: Marijuana    Comment: 07/09/20 Pt states she uses for leg pain: marijuana; states no longer uses cocaine   Sexual activity: Yes    Birth control/protection: Post-menopausal  Other Topics Concern   Not on file  Social History Narrative   Lives with cousin.   Social Determinants of Health   Financial Resource Strain: Low Risk  (03/22/2021)   Overall Financial Resource Strain (CARDIA)    Difficulty of Paying Living Expenses: Not very hard  Food Insecurity: Not on file  Transportation Needs: Not on file   Physical Activity: Not on file  Stress: Not on file  Social Connections: Not on file  Intimate Partner Violence: Not on file      Review of Systems  Constitutional:  Negative for chills, fatigue and unexpected weight change.  HENT:  Negative for congestion, rhinorrhea, sneezing and sore throat.   Eyes:  Negative for redness.  Respiratory: Negative.  Negative for cough, chest tightness, shortness of breath and wheezing.   Cardiovascular: Negative.  Negative for chest pain and palpitations.  Gastrointestinal:  Negative for abdominal pain, constipation, diarrhea, nausea and vomiting.  Genitourinary:  Negative for dysuria and frequency.  Musculoskeletal:  Negative for arthralgias, back pain, joint swelling and neck pain.  Skin:  Negative for rash.  Neurological:  Negative for dizziness, tremors, numbness and headaches.  Hematological:  Negative for adenopathy. Does not bruise/bleed easily.  Psychiatric/Behavioral:  Negative for behavioral problems (Depression), sleep disturbance and suicidal ideas. The patient is not nervous/anxious.     Vital Signs: BP (!) 150/80 Comment: 150/97  Pulse 70   Temp (!) 96.5 F (35.8 C)   Resp 16   Ht '5\' 5"'$  (1.651 m)   Wt 250 lb 6.4 oz (113.6 kg)   LMP 01/26/2017 Comment: age 71  SpO2 97%   BMI 41.67 kg/m    Physical Exam Vitals reviewed.  Constitutional:      General: She is not in acute distress.    Appearance: Normal appearance. She is obese. She is not ill-appearing.  HENT:     Head: Normocephalic and atraumatic.  Eyes:     Pupils: Pupils are equal, round, and reactive to light.  Cardiovascular:     Rate and Rhythm: Normal rate and regular rhythm.  Pulmonary:     Effort: Pulmonary effort is normal. No respiratory distress.  Neurological:     Mental Status: She is alert and oriented to person, place, and time.     Cranial Nerves: No cranial nerve deficit.     Coordination: Coordination normal.     Gait: Gait normal.  Psychiatric:         Mood and Affect: Mood normal.        Behavior: Behavior normal.        Assessment/Plan: 1. Essential hypertension BP slightly improved but still elevated. Her stress level and anxiety are high which are contributing to her elevated BP.  Continue medications as prescribed. Recommend increasing movement throughout the day with walking and other exercise that she enjoys. Continue to work on diet modifications as discussed. Follow up in 8 weeks.   2. Stage 3b chronic kidney disease ([place]) Stable, recommended that patient schedule follow up with her kidney doctor since she has not seen him since last year.   3. Aortic atherosclerosis (HCC) Start rosuvastatin 10 mg daily today, after 1 month, start rosuvastatin 20 mg daily with the next refill. Repeat lipid panel in 3-6 months.  - rosuvastatin (CRESTOR) 10 MG tablet; Take 1 tablet (10 mg total) by mouth daily.  Dispense: 30 tablet; Refill: 0 - rosuvastatin (CRESTOR) 20 MG tablet; Take 1 tablet (20 mg total) by mouth daily.  Dispense: 30 tablet; Refill: 2  4. Vitamin D deficiency Start weekly prescription vitamin D supplement. Repeat vitamin D level in 6 months.  - Vitamin D, Ergocalciferol, (DRISDOL) 1.25 MG (50000 UNIT) CAPS capsule; Take 1 capsule (50,000 Units total) by mouth every 7 (seven) days.  Dispense: 5 capsule; Refill: 5  5. Needs flu shot Administered in office today - Flu Vaccine MDCK QUAD PF   General Counseling: Tajia verbalizes understanding of the findings of todays visit and agrees with plan of treatment. I have discussed any further diagnostic evaluation that may be needed or ordered today. We also reviewed her medications today. she has been encouraged to call the office with any questions or concerns that should arise related to todays visit.    Orders Placed This Encounter  Procedures   Flu Vaccine MDCK QUAD PF    Meds ordered this encounter  Medications   Vitamin D, Ergocalciferol, (DRISDOL) 1.25 MG  (50000 UNIT) CAPS capsule    Sig: Take 1 capsule (50,000 Units total) by mouth every 7 (seven) days.    Dispense:  5 capsule    Refill:  5   rosuvastatin (CRESTOR) 10 MG tablet    Sig: Take 1 tablet (10 mg total) by mouth daily.    Dispense:  30 tablet    Refill:  0    Please fill today -- only 1 month, next prescription will be increased dose.   rosuvastatin (CRESTOR) 20 MG tablet    Sig: Take 1 tablet (20 mg total) by mouth daily.    Dispense:  30 tablet    Refill:  2    To be filled after rosuvastatin 10 mg, this is the increased dose.    Return in about 2 months (around 06/29/2022) for F/U, BP check, Kiaya Haliburton PCP.   Total time spent:30 Minutes Time spent includes review of chart, medications, test results, and follow up plan with the patient.   [place] Controlled Substance Database was reviewed by me.  This patient was seen by Jonetta Osgood, FNP-C in collaboration with Dr. Clayborn Bigness as a part of collaborative care agreement.   Noelly Lasseigne R. Valetta Fuller, MSN, FNP-C Internal medicine

## 2022-04-29 NOTE — Progress Notes (Signed)
Patients presents for annual exam today. She states doing well, recently getting over her knee surgery. Patient is postmenopausal with no issues or hot flashes. Patient is due for mammogram, ordered. Annual labs are declined, recently done with PCP. Patient states no other questions or concerns at this time.

## 2022-05-31 ENCOUNTER — Encounter: Payer: Self-pay | Admitting: Nurse Practitioner

## 2022-07-01 ENCOUNTER — Ambulatory Visit: Payer: Medicare Other | Admitting: Nurse Practitioner

## 2022-07-11 ENCOUNTER — Telehealth (INDEPENDENT_AMBULATORY_CARE_PROVIDER_SITE_OTHER): Payer: Medicare Other | Admitting: Physician Assistant

## 2022-07-11 ENCOUNTER — Encounter: Payer: Self-pay | Admitting: Physician Assistant

## 2022-07-11 VITALS — Ht 62.0 in | Wt 247.0 lb

## 2022-07-11 DIAGNOSIS — U071 COVID-19: Secondary | ICD-10-CM | POA: Diagnosis not present

## 2022-07-11 MED ORDER — AZITHROMYCIN 250 MG PO TABS: ORAL_TABLET | ORAL | 0 refills | Status: AC

## 2022-07-11 NOTE — Progress Notes (Signed)
[facility] [place], [place] [address]  Internal MEDICINE  Telephone Visit  Patient Name: Whitney [name]  [id number]  [id number]  Date of Service: 07/11/2022  I connected with the patient at 8:48 by telephone and verified the patients identity using two identifiers.   I discussed the limitations, risks, security and privacy concerns of performing an evaluation and management service by telephone and the availability of in person appointments. I also discussed with the patient that there may be a patient responsible charge related to the service.  The patient expressed understanding and agrees to proceed.    Chief Complaint  Patient presents with   Telephone Assessment    Telephone [phone]   Telephone Screen    Home covid  test was positive last Wednesday    Sinusitis   Headache   Cough    HPI Pt is here for virtual sick visit -Tested positive for covid last Wednesday, her BF went to the hospital on Wednesday and was diagnosed with bacterial PNA which made her worried so she contacted office for appt -Starting to feel better, still has slight cough and congestion. Some headache/sinus pressure. No aching, fever, or chills. No wheezing or SOB. -Taking benadryl, alka seltzer +cold, tylenol cough, mucinex spray, and drinking plenty of water and cranberry juice. Reports her appetite is normal -Has used albuterol twice when it first started, but otherwise hasn't needed it.  Current Medication: Outpatient Encounter Medications as of 07/11/2022  Medication Sig   albuterol (VENTOLIN HFA) 108 (90 Base) MCG/ACT inhaler Inhale 2 puffs into the lungs every 4 (four) hours as needed for wheezing or shortness of breath.   amLODipine (NORVASC) 5 MG tablet Take 1 tablet (5 mg total) by mouth daily.   aspirin 81 MG chewable tablet Chew 1 tablet (81 mg total) by mouth 2 (two) times daily.   azithromycin (ZITHROMAX) 250 MG tablet Take 2 tablets on day 1, then 1 tablet daily  on days 2 through 5   carvedilol (COREG) 12.5 MG tablet Take 1 tablet by mouth 2 (two) times daily.   diclofenac Sodium (VOLTAREN) 1 % GEL Apply 2 g topically daily as needed (Knee pain).   docusate sodium (COLACE) 100 MG capsule Take 1 capsule (100 mg total) by mouth 2 (two) times daily.   ezetimibe (ZETIA) 10 MG tablet Take 1 tablet (10 mg total) by mouth daily.   gabapentin (NEURONTIN) 100 MG capsule Take 200 mg by mouth 2 (two) times daily.   hydrALAZINE (APRESOLINE) 25 MG tablet Take 1 tablet (25 mg total) by mouth 3 (three) times daily. For HTN   orlistat (ALLI) 60 MG capsule Take 60 mg by mouth 3 (three) times daily with meals.   rosuvastatin (CRESTOR) 10 MG tablet TAKE 1 TABLET(10 MG) BY MOUTH DAILY   rosuvastatin (CRESTOR) 20 MG tablet Take 1 tablet (20 mg total) by mouth daily.   Vitamin D, Ergocalciferol, (DRISDOL) 1.25 MG (50000 UNIT) CAPS capsule Take 1 capsule (50,000 Units total) by mouth every 7 (seven) days.   No facility-administered encounter medications on file as of 07/11/2022.    Surgical History: Past Surgical History:  Procedure Laterality Date   BIOPSY  10/31/2019   Procedure: BIOPSY;  Surgeon: Rush Landmark Telford Nab., MD;  Location: [place];  Service: Gastroenterology;;   CARDIAC CATHETERIZATION     CHOLECYSTECTOMY     COLONOSCOPY WITH PROPOFOL N/A 06/13/2019   Procedure: COLONOSCOPY WITH PROPOFOL;  Surgeon: [name], MD;  Location: [facility] ENDOSCOPY;  Service:  Gastroenterology;  Laterality: N/A;   COLONOSCOPY WITH PROPOFOL N/A 08/12/2019   Procedure: COLONOSCOPY WITH PROPOFOL;  Surgeon: [name], MD;  Location: [facility] ENDOSCOPY;  Service: Gastroenterology;  Laterality: N/A;   COLONOSCOPY WITH PROPOFOL N/A 09/19/2019   Procedure: COLONOSCOPY WITH PROPOFOL;  Surgeon: [name], MD;  Location: [facility] ENDOSCOPY;  Service: Gastroenterology;  Laterality: N/A;   COLONOSCOPY WITH PROPOFOL N/A 06/04/2021   Procedure: COLONOSCOPY WITH PROPOFOL;  Surgeon: [name], MD;   Location: [facility] ENDOSCOPY;  Service: Gastroenterology;  Laterality: N/A;   DILATION AND CURETTAGE OF UTERUS     ESOPHAGOGASTRODUODENOSCOPY (EGD) WITH PROPOFOL N/A 06/13/2019   Procedure: ESOPHAGOGASTRODUODENOSCOPY (EGD) WITH PROPOFOL;  Surgeon: [name], MD;  Location: [facility] ENDOSCOPY;  Service: Gastroenterology;  Laterality: N/A;  Pt will go for COVID test on 41-9-62 as she uses public transportation and will be in the area on that day.    ESOPHAGOGASTRODUODENOSCOPY (EGD) WITH PROPOFOL N/A 10/31/2019   Procedure: ESOPHAGOGASTRODUODENOSCOPY (EGD) WITH PROPOFOL;  Surgeon: Rush Landmark Telford Nab., MD;  Location: [place];  Service: Gastroenterology;  Laterality: N/A;   ESOPHAGOGASTRODUODENOSCOPY (EGD) WITH PROPOFOL N/A 06/04/2021   Procedure: ESOPHAGOGASTRODUODENOSCOPY (EGD) WITH PROPOFOL;  Surgeon: [name], MD;  Location: [facility] ENDOSCOPY;  Service: Gastroenterology;  Laterality: N/A;   HEMOSTASIS CLIP PLACEMENT  10/31/2019   Procedure: HEMOSTASIS CLIP PLACEMENT;  Surgeon: Irving Copas., MD;  Location: [place];  Service: Gastroenterology;;   HERNIA REPAIR     HYSTEROSCOPY WITH D & C N/A 02/16/2017   Procedure: DILATATION AND CURETTAGE /HYSTEROSCOPY;  Surgeon: Brayton Mars, MD;  Location: [facility];  Service: Gynecology;  Laterality: N/A;   JOINT REPLACEMENT Left    knee   kidney removal Left    KIDNEY SURGERY Left 1979   left kidney removed   LEFT HEART CATH AND CORONARY ANGIOGRAPHY N/A 10/21/2017   Procedure: LEFT HEART CATH AND CORONARY ANGIOGRAPHY;  Surgeon: Teodoro Spray, MD;  Location: [place] CV [facility];  Service: Cardiovascular;  Laterality: N/A;   POLYPECTOMY  10/31/2019   Procedure: POLYPECTOMY;  Surgeon: Rush Landmark Telford Nab., MD;  Location: [place];  Service: Gastroenterology;;   TOTAL KNEE ARTHROPLASTY Left 07/06/2017   Procedure: TOTAL KNEE ARTHROPLASTY;  Surgeon: Lovell Sheehan, MD;  Location: [facility];  Service: Orthopedics;  Laterality: Left;    TOTAL KNEE ARTHROPLASTY Right 02/25/2021   Procedure: TOTAL KNEE ARTHROPLASTY;  Surgeon: Lovell Sheehan, MD;  Location: [facility];  Service: Orthopedics;  Laterality: Right;   UPPER ESOPHAGEAL ENDOSCOPIC ULTRASOUND (EUS) N/A 10/31/2019   Procedure: UPPER ESOPHAGEAL ENDOSCOPIC ULTRASOUND (EUS);  Surgeon: Irving Copas., MD;  Location: [place];  Service: Gastroenterology;  Laterality: N/A;   WRIST ARTHROSCOPY Left 1970s   XI ROBOTIC ASSISTED VENTRAL HERNIA N/A 12/01/2019   Procedure: XI ROBOTIC ASSISTED VENTRAL HERNIA;  Surgeon: Jules Husbands, MD;  Location: [facility];  Service: General;  Laterality: N/A;    Medical History: Past Medical History:  Diagnosis Date   Abnormal Pap smear of cervix    Pt states she had colposcopy    Anxiety    Aortic atherosclerosis (HCC)    Arthritis    CAD (coronary artery disease)    Chronic kidney disease    s/p LEFT nephrectomy   Chronic lower back pain    COVID    Depression    Diverticulitis    Dyspnea    History of methicillin resistant staphylococcus aureus (MRSA) 12/21/2020   nasal swab pcr   Hx of unilateral nephrectomy 1979   Left  Nephrectomy   Hypertension    Lower extremity edema    PAF (paroxysmal atrial fibrillation) (HCC)    Palpitations    PMB (postmenopausal bleeding)    Restless leg syndrome    Sleep apnea    has no cpap   Symptomatic bradycardia    Vitamin D deficiency     Family History: Family History  Problem Relation Age of Onset   Ovarian cancer Mother    Hypertension Father    Diabetes Father    Cancer Father    Breast cancer Sister    Diabetes Sister     Social History   Socioeconomic History   Marital status: Divorced    Spouse name: Not on file   Number of children: Not on file   Years of education: Not on file   Highest education level: Not on file  Occupational History   Not on file  Tobacco Use   Smoking status: Never   Smokeless tobacco: Never  Vaping Use   Vaping Use: Never used   Substance and Sexual Activity   Alcohol use: Yes    Alcohol/week: 1.0 standard drink of alcohol    Types: 1 Standard drinks or equivalent per week    Comment: occasionally   Drug use: Yes    Frequency: 3.0 times per week    Types: Marijuana    Comment: 07/09/20 Pt states she uses for leg pain: marijuana; states no longer uses cocaine   Sexual activity: Yes    Birth control/protection: Post-menopausal  Other Topics Concern   Not on file  Social History Narrative   Lives with cousin.   Social Determinants of Health   Financial Resource Strain: Low Risk  (03/22/2021)   Overall Financial Resource Strain (CARDIA)    Difficulty of Paying Living Expenses: Not very hard  Food Insecurity: Not on file  Transportation Needs: Not on file  Physical Activity: Not on file  Stress: Not on file  Social Connections: Not on file  Intimate Partner Violence: Not on file      Review of Systems  Constitutional:  Negative for fatigue and fever.  HENT:  Positive for congestion, postnasal drip and sinus pressure. Negative for mouth sores.   Respiratory:  Positive for cough. Negative for shortness of breath and wheezing.   Cardiovascular:  Negative for chest pain.  Genitourinary:  Negative for flank pain.  Neurological:  Positive for headaches.  Psychiatric/Behavioral: Negative.      Vital Signs: Ht '5\' 2"'$  (1.575 m)   Wt 247 lb (112 kg)   LMP 01/26/2017 Comment: age 71  BMI 45.18 kg/m    Observation/Objective:  Pt is able to carry out conversation   Assessment/Plan: 1. Acute COVID-19 Covid diagnosis over a week ago but lingering cough and congestion. Will go ahead and start zpak and may continue mucinex and tylenol as needed. Advised to rest and stray hydrated. - azithromycin (ZITHROMAX) 250 MG tablet; Take 2 tablets on day 1, then 1 tablet daily on days 2 through 5  Dispense: 6 tablet; Refill: 0   General Counseling: Cassidie verbalizes understanding of the findings of today's phone  visit and agrees with plan of treatment. I have discussed any further diagnostic evaluation that may be needed or ordered today. We also reviewed her medications today. she has been encouraged to call the office with any questions or concerns that should arise related to todays visit.    No orders of the defined types were placed in this encounter.   Meds  ordered this encounter  Medications   azithromycin (ZITHROMAX) 250 MG tablet    Sig: Take 2 tablets on day 1, then 1 tablet daily on days 2 through 5    Dispense:  6 tablet    Refill:  0    Time spent:25 Minutes    Dr Lavera Guise Internal medicine

## 2022-07-14 ENCOUNTER — Ambulatory Visit: Payer: Medicare Other | Admitting: Nurse Practitioner

## 2022-07-31 ENCOUNTER — Ambulatory Visit: Payer: Medicare Other | Admitting: Nurse Practitioner

## 2022-09-01 ENCOUNTER — Encounter: Payer: Self-pay | Admitting: Nurse Practitioner

## 2022-09-01 ENCOUNTER — Ambulatory Visit (INDEPENDENT_AMBULATORY_CARE_PROVIDER_SITE_OTHER): Payer: 59 | Admitting: Nurse Practitioner

## 2022-09-01 ENCOUNTER — Telehealth: Payer: Self-pay | Admitting: Nurse Practitioner

## 2022-09-01 VITALS — BP 130/82 | HR 76 | Temp 97.0°F | Resp 16 | Ht 65.0 in | Wt 256.6 lb

## 2022-09-01 DIAGNOSIS — M19012 Primary osteoarthritis, left shoulder: Secondary | ICD-10-CM | POA: Diagnosis not present

## 2022-09-01 DIAGNOSIS — E559 Vitamin D deficiency, unspecified: Secondary | ICD-10-CM

## 2022-09-01 DIAGNOSIS — Z23 Encounter for immunization: Secondary | ICD-10-CM | POA: Diagnosis not present

## 2022-09-01 DIAGNOSIS — R252 Cramp and spasm: Secondary | ICD-10-CM | POA: Diagnosis not present

## 2022-09-01 DIAGNOSIS — M47816 Spondylosis without myelopathy or radiculopathy, lumbar region: Secondary | ICD-10-CM

## 2022-09-01 DIAGNOSIS — M19019 Primary osteoarthritis, unspecified shoulder: Secondary | ICD-10-CM | POA: Insufficient documentation

## 2022-09-01 DIAGNOSIS — I700 Atherosclerosis of aorta: Secondary | ICD-10-CM | POA: Diagnosis not present

## 2022-09-01 DIAGNOSIS — M170 Bilateral primary osteoarthritis of knee: Secondary | ICD-10-CM | POA: Diagnosis not present

## 2022-09-01 DIAGNOSIS — Z1231 Encounter for screening mammogram for malignant neoplasm of breast: Secondary | ICD-10-CM

## 2022-09-01 MED ORDER — ROSUVASTATIN CALCIUM 20 MG PO TABS: 20.0000 mg | ORAL_TABLET | Freq: Every day | ORAL | 2 refills | Status: DC

## 2022-09-01 MED ORDER — ZOSTER VAC RECOMB ADJUVANTED 50 MCG/0.5ML IM SUSR: 0.5000 mL | Freq: Once | INTRAMUSCULAR | 0 refills | Status: AC

## 2022-09-01 MED ORDER — METHOCARBAMOL 500 MG PO TABS: 500.0000 mg | ORAL_TABLET | Freq: Three times a day (TID) | ORAL | 2 refills | Status: DC | PRN

## 2022-09-01 MED ORDER — MAGNESIUM OXIDE 400 MG PO TABS: 400.0000 mg | ORAL_TABLET | Freq: Every day | ORAL | Status: DC

## 2022-09-01 MED ORDER — TETANUS-DIPHTH-ACELL PERTUSSIS 5-2.5-18.5 LF-MCG/0.5 IM SUSP: 0.5000 mL | Freq: Once | INTRAMUSCULAR | 0 refills | Status: AC

## 2022-09-01 MED ORDER — PNEUMOCOCCAL 20-VAL CONJ VACC 0.5 ML IM SUSY: 0.5000 mL | PREFILLED_SYRINGE | INTRAMUSCULAR | 0 refills | Status: AC

## 2022-09-01 MED ORDER — VITAMIN D (ERGOCALCIFEROL) 1.25 MG (50000 UNIT) PO CAPS: 50000.0000 [IU] | ORAL_CAPSULE | ORAL | 1 refills | Status: DC

## 2022-09-01 NOTE — Telephone Encounter (Signed)
Lvm notifying patient of mammogram appointment date, time & location-[place]

## 2022-09-01 NOTE — Progress Notes (Signed)
[facility] [place], [place] [address]  Internal MEDICINE  Office Visit Note  Patient Name: Whitney [name]  [id number]  [id number]  Date of Service: 09/01/2022  Chief Complaint  Patient presents with   Follow-up   Depression   Hypertension    HPI Whitney [name] presents for a follow-up visit for hypertension, low back pain, neck pain, headaches.  Muscle spasms left sided -- stemming from low back pain and wants to know what to take for that  Headaches from neck pain, brain MRI was normal.  Medication refills are needed.  Low vitamin D -- did not get prescription supplement Has been on increased dose of rosuvastatin, needs refills and repeat labs    Current Medication: Outpatient Encounter Medications as of 09/01/2022  Medication Sig   albuterol (VENTOLIN HFA) 108 (90 Base) MCG/ACT inhaler Inhale 2 puffs into the lungs every 4 (four) hours as needed for wheezing or shortness of breath.   amLODipine (NORVASC) 5 MG tablet Take 1 tablet (5 mg total) by mouth daily.   aspirin 81 MG chewable tablet Chew 1 tablet (81 mg total) by mouth 2 (two) times daily.   carvedilol (COREG) 12.5 MG tablet Take 1 tablet by mouth 2 (two) times daily.   diclofenac Sodium (VOLTAREN) 1 % GEL Apply 2 g topically daily as needed (Knee pain).   docusate sodium (COLACE) 100 MG capsule Take 1 capsule (100 mg total) by mouth 2 (two) times daily.   ezetimibe (ZETIA) 10 MG tablet Take 1 tablet (10 mg total) by mouth daily.   gabapentin (NEURONTIN) 100 MG capsule Take 200 mg by mouth 2 (two) times daily.   hydrALAZINE (APRESOLINE) 25 MG tablet Take 1 tablet (25 mg total) by mouth 3 (three) times daily. For HTN   magnesium oxide (MAG-OX) 400 MG tablet Take 1 tablet (400 mg total) by mouth daily.   methocarbamol (ROBAXIN) 500 MG tablet Take 1 tablet (500 mg total) by mouth every 8 (eight) hours as needed for muscle spasms.   orlistat (ALLI) 60 MG capsule Take 60 mg by mouth 3 (three) times daily  with meals.   pneumococcal 20-valent conjugate vaccine (PREVNAR 20) 0.5 ML injection Inject 0.5 mLs into the muscle tomorrow at 10 am for 1 dose.   Vitamin D, Ergocalciferol, (DRISDOL) 1.25 MG (50000 UNIT) CAPS capsule Take 1 capsule (50,000 Units total) by mouth every 7 (seven) days.   [DISCONTINUED] rosuvastatin (CRESTOR) 10 MG tablet TAKE 1 TABLET(10 MG) BY MOUTH DAILY   [DISCONTINUED] rosuvastatin (CRESTOR) 20 MG tablet Take 1 tablet (20 mg total) by mouth daily.   [DISCONTINUED] Tdap (BOOSTRIX) 5-2.5-18.5 LF-MCG/0.5 injection Inject 0.5 mLs into the muscle once.   [DISCONTINUED] Vitamin D, Ergocalciferol, (DRISDOL) 1.25 MG (50000 UNIT) CAPS capsule Take 1 capsule (50,000 Units total) by mouth every 7 (seven) days.   [DISCONTINUED] Zoster Vaccine Adjuvanted [facility]) injection Inject 0.5 mLs into the muscle once.   rosuvastatin (CRESTOR) 20 MG tablet Take 1 tablet (20 mg total) by mouth daily.   Tdap (BOOSTRIX) 5-2.5-18.5 LF-MCG/0.5 injection Inject 0.5 mLs into the muscle once for 1 dose.   Zoster Vaccine Adjuvanted [facility]) injection Inject 0.5 mLs into the muscle once for 1 dose.   [DISCONTINUED] rosuvastatin (CRESTOR) 20 MG tablet Take 1 tablet (20 mg total) by mouth daily.   No facility-administered encounter medications on file as of 09/01/2022.    Surgical History: Past Surgical History:  Procedure Laterality Date   BIOPSY  10/31/2019   Procedure: BIOPSY;  Surgeon: Irving Copas., MD;  Location: [place];  Service: Gastroenterology;;   CARDIAC CATHETERIZATION     CHOLECYSTECTOMY     COLONOSCOPY WITH PROPOFOL N/A 06/13/2019   Procedure: COLONOSCOPY WITH PROPOFOL;  Surgeon: [name], MD;  Location: [facility] ENDOSCOPY;  Service: Gastroenterology;  Laterality: N/A;   COLONOSCOPY WITH PROPOFOL N/A 08/12/2019   Procedure: COLONOSCOPY WITH PROPOFOL;  Surgeon: [name], MD;  Location: [facility] ENDOSCOPY;  Service: Gastroenterology;  Laterality: N/A;   COLONOSCOPY WITH PROPOFOL  N/A 09/19/2019   Procedure: COLONOSCOPY WITH PROPOFOL;  Surgeon: [name], MD;  Location: [facility] ENDOSCOPY;  Service: Gastroenterology;  Laterality: N/A;   COLONOSCOPY WITH PROPOFOL N/A 06/04/2021   Procedure: COLONOSCOPY WITH PROPOFOL;  Surgeon: [name], MD;  Location: [facility] ENDOSCOPY;  Service: Gastroenterology;  Laterality: N/A;   DILATION AND CURETTAGE OF UTERUS     ESOPHAGOGASTRODUODENOSCOPY (EGD) WITH PROPOFOL N/A 06/13/2019   Procedure: ESOPHAGOGASTRODUODENOSCOPY (EGD) WITH PROPOFOL;  Surgeon: [name], MD;  Location: [facility] ENDOSCOPY;  Service: Gastroenterology;  Laterality: N/A;  Pt will go for COVID test on 29-9-24 as she uses public transportation and will be in the area on that day.    ESOPHAGOGASTRODUODENOSCOPY (EGD) WITH PROPOFOL N/A 10/31/2019   Procedure: ESOPHAGOGASTRODUODENOSCOPY (EGD) WITH PROPOFOL;  Surgeon: Rush Landmark Telford Nab., MD;  Location: [place];  Service: Gastroenterology;  Laterality: N/A;   ESOPHAGOGASTRODUODENOSCOPY (EGD) WITH PROPOFOL N/A 06/04/2021   Procedure: ESOPHAGOGASTRODUODENOSCOPY (EGD) WITH PROPOFOL;  Surgeon: [name], MD;  Location: [facility] ENDOSCOPY;  Service: Gastroenterology;  Laterality: N/A;   HEMOSTASIS CLIP PLACEMENT  10/31/2019   Procedure: HEMOSTASIS CLIP PLACEMENT;  Surgeon: Irving Copas., MD;  Location: [place];  Service: Gastroenterology;;   HERNIA REPAIR     HYSTEROSCOPY WITH D & C N/A 02/16/2017   Procedure: DILATATION AND CURETTAGE /HYSTEROSCOPY;  Surgeon: Brayton Mars, MD;  Location: [facility] ORS;  Service: Gynecology;  Laterality: N/A;   JOINT REPLACEMENT Left    knee   kidney removal Left    KIDNEY SURGERY Left 1979   left kidney removed   LEFT HEART CATH AND CORONARY ANGIOGRAPHY N/A 10/21/2017   Procedure: LEFT HEART CATH AND CORONARY ANGIOGRAPHY;  Surgeon: Teodoro Spray, MD;  Location: [place] CV [facility];  Service: Cardiovascular;  Laterality: N/A;   POLYPECTOMY  10/31/2019   Procedure: POLYPECTOMY;   Surgeon: Rush Landmark Telford Nab., MD;  Location: [place];  Service: Gastroenterology;;   TOTAL KNEE ARTHROPLASTY Left 07/06/2017   Procedure: TOTAL KNEE ARTHROPLASTY;  Surgeon: Lovell Sheehan, MD;  Location: [facility] ORS;  Service: Orthopedics;  Laterality: Left;   TOTAL KNEE ARTHROPLASTY Right 02/25/2021   Procedure: TOTAL KNEE ARTHROPLASTY;  Surgeon: Lovell Sheehan, MD;  Location: [facility] ORS;  Service: Orthopedics;  Laterality: Right;   UPPER ESOPHAGEAL ENDOSCOPIC ULTRASOUND (EUS) N/A 10/31/2019   Procedure: UPPER ESOPHAGEAL ENDOSCOPIC ULTRASOUND (EUS);  Surgeon: Irving Copas., MD;  Location: [place];  Service: Gastroenterology;  Laterality: N/A;   WRIST ARTHROSCOPY Left 1970s   XI ROBOTIC ASSISTED VENTRAL HERNIA N/A 12/01/2019   Procedure: XI ROBOTIC ASSISTED VENTRAL HERNIA;  Surgeon: Jules Husbands, MD;  Location: [facility] ORS;  Service: General;  Laterality: N/A;    Medical History: Past Medical History:  Diagnosis Date   Abnormal Pap smear of cervix    Pt states she had colposcopy    Anxiety    Aortic atherosclerosis (HCC)    Arthritis    CAD (coronary artery disease)    Chronic kidney disease    s/p LEFT nephrectomy  Chronic lower back pain    COVID    Depression    Diverticulitis    Dyspnea    History of methicillin resistant staphylococcus aureus (MRSA) 12/21/2020   nasal swab pcr   Hx of unilateral nephrectomy 1979   Left Nephrectomy   Hypertension    Lower extremity edema    PAF (paroxysmal atrial fibrillation) (HCC)    Palpitations    PMB (postmenopausal bleeding)    Restless leg syndrome    Sleep apnea    has no cpap   Symptomatic bradycardia    Vitamin D deficiency     Family History: Family History  Problem Relation Age of Onset   Ovarian cancer Mother    Hypertension Father    Diabetes Father    Cancer Father    Breast cancer Sister    Diabetes Sister     Social History   Socioeconomic History   Marital status: Divorced    Spouse  name: Not on file   Number of children: Not on file   Years of education: Not on file   Highest education level: Not on file  Occupational History   Not on file  Tobacco Use   Smoking status: Never   Smokeless tobacco: Never  Vaping Use   Vaping Use: Never used  Substance and Sexual Activity   Alcohol use: Yes    Alcohol/week: 1.0 standard drink of alcohol    Types: 1 Standard drinks or equivalent per week    Comment: occasionally   Drug use: Yes    Frequency: 3.0 times per week    Types: Marijuana    Comment: 07/09/20 Pt states she uses for leg pain: marijuana; states no longer uses cocaine   Sexual activity: Yes    Birth control/protection: Post-menopausal  Other Topics Concern   Not on file  Social History Narrative   Lives with cousin.   Social Determinants of Health   Financial Resource Strain: Low Risk  (03/22/2021)   Overall Financial Resource Strain (CARDIA)    Difficulty of Paying Living Expenses: Not very hard  Food Insecurity: Not on file  Transportation Needs: Not on file  Physical Activity: Not on file  Stress: Not on file  Social Connections: Not on file  Intimate Partner Violence: Not on file      Review of Systems  Constitutional:  Negative for chills, fatigue and unexpected weight change.  HENT:  Negative for congestion, rhinorrhea, sneezing and sore throat.   Eyes:  Negative for redness.  Respiratory: Negative.  Negative for cough, chest tightness, shortness of breath and wheezing.   Cardiovascular: Negative.  Negative for chest pain and palpitations.  Gastrointestinal:  Negative for abdominal pain, constipation, diarrhea, nausea and vomiting.  Genitourinary:  Negative for dysuria and frequency.  Musculoskeletal:  Negative for arthralgias, back pain, joint swelling and neck pain.  Skin:  Negative for rash.  Neurological:  Negative for dizziness, tremors, numbness and headaches.  Hematological:  Negative for adenopathy. Does not bruise/bleed easily.   Psychiatric/Behavioral:  Negative for behavioral problems (Depression), sleep disturbance and suicidal ideas. The patient is not nervous/anxious.     Vital Signs: BP 130/82   Pulse 76   Temp (!) 97 F (36.1 C)   Resp 16   Ht '5\' 5"'$  (1.651 m)   Wt 256 lb 9.6 oz (116.4 kg)   LMP 01/26/2017 Comment: age 72  SpO2 96%   BMI 42.70 kg/m    Physical Exam Vitals reviewed.  Constitutional:  General: She is not in acute distress.    Appearance: Normal appearance. She is obese. She is not ill-appearing.  HENT:     Head: Normocephalic and atraumatic.  Eyes:     Pupils: Pupils are equal, round, and reactive to light.  Cardiovascular:     Rate and Rhythm: Normal rate and regular rhythm.  Pulmonary:     Effort: Pulmonary effort is normal. No respiratory distress.  Neurological:     Mental Status: She is alert and oriented to person, place, and time.     Cranial Nerves: No cranial nerve deficit.     Coordination: Coordination normal.     Gait: Gait normal.  Psychiatric:        Mood and Affect: Mood normal.        Behavior: Behavior normal.        Assessment/Plan: 1. Lumbar spondylosis Take muscle relaxer prn as prescribed.  - methocarbamol (ROBAXIN) 500 MG tablet; Take 1 tablet (500 mg total) by mouth every 8 (eight) hours as needed for muscle spasms.  Dispense: 90 tablet; Refill: 2  2. Muscle cramps Take OTC magnesium oxide daily at bedtime to help with muscle cramps. Muscle relaxer ordered, take prn as prescribed.  - methocarbamol (ROBAXIN) 500 MG tablet; Take 1 tablet (500 mg total) by mouth every 8 (eight) hours as needed for muscle spasms.  Dispense: 90 tablet; Refill: 2 - magnesium oxide (MAG-OX) 400 MG tablet; Take 1 tablet (400 mg total) by mouth daily.  3. Aortic atherosclerosis (HCC) Continue rosuvastatin as prescribed. Repeat lipid panel - Lipid Profile - rosuvastatin (CRESTOR) 20 MG tablet; Take 1 tablet (20 mg total) by mouth daily.  Dispense: 90 tablet;  Refill: 2  4. Vitamin D deficiency Reordered weekly vitamin D supplement, take as prescribed - Vitamin D, Ergocalciferol, (DRISDOL) 1.25 MG (50000 UNIT) CAPS capsule; Take 1 capsule (50,000 Units total) by mouth every 7 (seven) days.  Dispense: 12 capsule; Refill: 1  5. Encounter for screening mammogram for malignant neoplasm of breast Routine mammogram ordered - MM 3D SCREEN BREAST BILATERAL; Future  6. Need for vaccination - Tdap ([place]) 5-2.5-18.5 LF-MCG/0.5 injection; Inject 0.5 mLs into the muscle once for 1 dose.  Dispense: 0.5 mL; Refill: 0 - Zoster Vaccine Adjuvanted [facility]) injection; Inject 0.5 mLs into the muscle once for 1 dose.  Dispense: 0.5 mL; Refill: 0 - pneumococcal 20-valent conjugate vaccine (PREVNAR 20) 0.5 ML injection; Inject 0.5 mLs into the muscle tomorrow at 10 am for 1 dose.  Dispense: 0.5 mL; Refill: 0   General Counseling: Whitney [name] verbalizes understanding of the findings of todays visit and agrees with plan of treatment. I have discussed any further diagnostic evaluation that may be needed or ordered today. We also reviewed her medications today. she has been encouraged to call the office with any questions or concerns that should arise related to todays visit.    Orders Placed This Encounter  Procedures   MM 3D SCREEN BREAST BILATERAL   Lipid Profile    Meds ordered this encounter  Medications   Tdap (BOOSTRIX) 5-2.5-18.5 LF-MCG/0.5 injection    Sig: Inject 0.5 mLs into the muscle once for 1 dose.    Dispense:  0.5 mL    Refill:  0   Zoster Vaccine Adjuvanted [facility]) injection    Sig: Inject 0.5 mLs into the muscle once for 1 dose.    Dispense:  0.5 mL    Refill:  0   methocarbamol (ROBAXIN) 500 MG tablet    Sig:  Take 1 tablet (500 mg total) by mouth every 8 (eight) hours as needed for muscle spasms.    Dispense:  90 tablet    Refill:  2    New script, fill today   magnesium oxide (MAG-OX) 400 MG tablet    Sig: Take 1 tablet (400 mg  total) by mouth daily.   Vitamin D, Ergocalciferol, (DRISDOL) 1.25 MG (50000 UNIT) CAPS capsule    Sig: Take 1 capsule (50,000 Units total) by mouth every 7 (seven) days.    Dispense:  12 capsule    Refill:  1   DISCONTD: rosuvastatin (CRESTOR) 20 MG tablet    Sig: Take 1 tablet (20 mg total) by mouth daily.    Dispense:  30 tablet    Refill:  2    To be filled after rosuvastatin 10 mg, this is the increased dose.   pneumococcal 20-valent conjugate vaccine (PREVNAR 20) 0.5 ML injection    Sig: Inject 0.5 mLs into the muscle tomorrow at 10 am for 1 dose.    Dispense:  0.5 mL    Refill:  0   rosuvastatin (CRESTOR) 20 MG tablet    Sig: Take 1 tablet (20 mg total) by mouth daily.    Dispense:  90 tablet    Refill:  2    Please fill as 90 day supply    Return in about 3 months (around 12/01/2022) for F/U, Whitney [name] PCP.   Total time spent:30 Minutes Time spent includes review of chart, medications, test results, and follow up plan with the patient.   [place] Controlled Substance Database was reviewed by me.  This patient was seen by Jonetta Osgood, FNP-C in collaboration with Dr. [place] Bigness as a part of collaborative care agreement.   Anavey Coombes R. Valetta Fuller, MSN, FNP-C Internal medicine

## 2022-09-05 DIAGNOSIS — I700 Atherosclerosis of aorta: Secondary | ICD-10-CM | POA: Diagnosis not present

## 2022-09-05 DIAGNOSIS — M25562 Pain in left knee: Secondary | ICD-10-CM | POA: Diagnosis not present

## 2022-09-05 DIAGNOSIS — Z96652 Presence of left artificial knee joint: Secondary | ICD-10-CM | POA: Diagnosis not present

## 2022-09-06 LAB — LIPID PANEL
Chol/HDL Ratio: 5 ratio — ABNORMAL HIGH (ref 0.0–4.4)
Cholesterol, Total: 433 mg/dL — ABNORMAL HIGH (ref 100–199)
HDL: 86 mg/dL (ref >39)
LDL Chol Calc (NIH): 312 mg/dL — ABNORMAL HIGH (ref 0–99)
Triglycerides: 176 mg/dL — ABNORMAL HIGH (ref 0–149)
VLDL Cholesterol Cal: 35 mg/dL (ref 5–40)

## 2022-09-08 DIAGNOSIS — M25562 Pain in left knee: Secondary | ICD-10-CM | POA: Diagnosis not present

## 2022-09-08 DIAGNOSIS — Z96652 Presence of left artificial knee joint: Secondary | ICD-10-CM | POA: Diagnosis not present

## 2022-09-09 ENCOUNTER — Other Ambulatory Visit: Payer: Self-pay | Admitting: Nurse Practitioner

## 2022-09-09 MED ORDER — ROSUVASTATIN CALCIUM 40 MG PO TABS: 40.0000 mg | ORAL_TABLET | Freq: Every day | ORAL | 1 refills | Status: DC

## 2022-09-09 NOTE — Progress Notes (Signed)
Please let the patient know that her LDL level is significantly more elevated, VLDL is borderline high normal and triglyceride level is elevated. Total cholesterol is >400. Her CVD risk is higher than average risk. I am increasing her rosuvastatin dose to 40 mg daily. She can take 2 of the 20-mg tablets daily until she runs out of those and I am ordering the 40 mg tablets.

## 2022-09-10 ENCOUNTER — Telehealth: Payer: Self-pay

## 2022-09-10 NOTE — Telephone Encounter (Signed)
Pt advised for labs and advised her that we send new chol med

## 2022-09-10 NOTE — Telephone Encounter (Signed)
-----   Message from Jonetta Osgood, NP sent at 09/09/2022  8:08 AM EST ----- Please let the patient know that her LDL level is significantly more elevated, VLDL is borderline high normal and triglyceride level is elevated. Total cholesterol is >400. Her CVD risk is higher than average risk. I am increasing her rosuvastatin dose to 40 mg daily. She can take 2 of the 20-mg tablets daily until she runs out of those and I am ordering the 40 mg tablets.

## 2022-09-12 DIAGNOSIS — M25562 Pain in left knee: Secondary | ICD-10-CM | POA: Diagnosis not present

## 2022-09-12 DIAGNOSIS — Z96652 Presence of left artificial knee joint: Secondary | ICD-10-CM | POA: Diagnosis not present

## 2022-09-16 DIAGNOSIS — Z96652 Presence of left artificial knee joint: Secondary | ICD-10-CM | POA: Diagnosis not present

## 2022-09-16 DIAGNOSIS — M25562 Pain in left knee: Secondary | ICD-10-CM | POA: Diagnosis not present

## 2022-09-24 DIAGNOSIS — M25562 Pain in left knee: Secondary | ICD-10-CM | POA: Diagnosis not present

## 2022-09-24 DIAGNOSIS — Z96652 Presence of left artificial knee joint: Secondary | ICD-10-CM | POA: Diagnosis not present

## 2022-09-30 DIAGNOSIS — Z96652 Presence of left artificial knee joint: Secondary | ICD-10-CM | POA: Diagnosis not present

## 2022-09-30 DIAGNOSIS — M25562 Pain in left knee: Secondary | ICD-10-CM | POA: Diagnosis not present

## 2022-10-02 DIAGNOSIS — Z96652 Presence of left artificial knee joint: Secondary | ICD-10-CM | POA: Diagnosis not present

## 2022-10-02 DIAGNOSIS — M25562 Pain in left knee: Secondary | ICD-10-CM | POA: Diagnosis not present

## 2022-10-06 DIAGNOSIS — M25562 Pain in left knee: Secondary | ICD-10-CM | POA: Diagnosis not present

## 2022-10-06 DIAGNOSIS — Z96652 Presence of left artificial knee joint: Secondary | ICD-10-CM | POA: Diagnosis not present

## 2022-10-08 DIAGNOSIS — M25562 Pain in left knee: Secondary | ICD-10-CM | POA: Diagnosis not present

## 2022-10-08 DIAGNOSIS — Z96652 Presence of left artificial knee joint: Secondary | ICD-10-CM | POA: Diagnosis not present

## 2022-10-15 ENCOUNTER — Encounter: Payer: Self-pay | Admitting: Nurse Practitioner

## 2022-10-15 ENCOUNTER — Telehealth (INDEPENDENT_AMBULATORY_CARE_PROVIDER_SITE_OTHER): Payer: 59 | Admitting: Nurse Practitioner

## 2022-10-15 VITALS — BP 148/81 | HR 76 | Temp 98.3°F | Resp 16 | Ht 65.0 in | Wt 243.0 lb

## 2022-10-15 DIAGNOSIS — K5792 Diverticulitis of intestine, part unspecified, without perforation or abscess without bleeding: Secondary | ICD-10-CM

## 2022-10-15 DIAGNOSIS — Z96652 Presence of left artificial knee joint: Secondary | ICD-10-CM | POA: Diagnosis not present

## 2022-10-15 DIAGNOSIS — M25562 Pain in left knee: Secondary | ICD-10-CM | POA: Diagnosis not present

## 2022-10-15 MED ORDER — METRONIDAZOLE 500 MG PO TABS: 500.0000 mg | ORAL_TABLET | Freq: Two times a day (BID) | ORAL | 0 refills | Status: AC

## 2022-10-15 MED ORDER — CIPROFLOXACIN HCL 500 MG PO TABS: 500.0000 mg | ORAL_TABLET | Freq: Two times a day (BID) | ORAL | 0 refills | Status: AC

## 2022-10-15 NOTE — Progress Notes (Addendum)
[facility] [place], [place] [address]  Internal MEDICINE  Telephone Visit  Patient Name: Whitney BLAKENSHIP  [id number]  [id number]  Date of Service: 10/15/2022  I connected with the patient at 1317 by telephone and verified the patients identity using two identifiers.   I discussed the limitations, risks, security and privacy concerns of performing an evaluation and management service by telephone and the availability of in person appointments. I also discussed with the patient that there may be a patient responsible charge related to the service.  The patient expressed understanding and agrees to proceed.    Chief Complaint  Patient presents with   Telephone Screen    Diverticulitis.Needs meds sent to [facility] in [place].   Telephone Assessment    HPI [place] presents for a telehealth virtual visit for diverticulitis Left side abdominal pain,  Took laxative and pepto bismol.  Called GI office but Dr. Vicente Males is out of the country and she has not been seen by them in about a year.  Started Saturday -- headache, abdominal pain and constipation and watery consistency.    Current Medication: Outpatient Encounter Medications as of 10/15/2022  Medication Sig   albuterol (VENTOLIN HFA) 108 (90 Base) MCG/ACT inhaler Inhale 2 puffs into the lungs every 4 (four) hours as needed for wheezing or shortness of breath.   amLODipine (NORVASC) 5 MG tablet Take 1 tablet (5 mg total) by mouth daily.   aspirin 81 MG chewable tablet Chew 1 tablet (81 mg total) by mouth 2 (two) times daily.   carvedilol (COREG) 12.5 MG tablet Take 1 tablet by mouth 2 (two) times daily.   ciprofloxacin (CIPRO) 500 MG tablet Take 1 tablet (500 mg total) by mouth 2 (two) times daily for 10 days.   diclofenac Sodium (VOLTAREN) 1 % GEL Apply 2 g topically daily as needed (Knee pain).   docusate sodium (COLACE) 100 MG capsule Take 1 capsule (100 mg total) by mouth 2 (two) times daily.   ezetimibe  (ZETIA) 10 MG tablet Take 1 tablet (10 mg total) by mouth daily.   gabapentin (NEURONTIN) 100 MG capsule Take 200 mg by mouth 2 (two) times daily.   hydrALAZINE (APRESOLINE) 25 MG tablet Take 1 tablet (25 mg total) by mouth 3 (three) times daily. For HTN   magnesium oxide (MAG-OX) 400 MG tablet Take 1 tablet (400 mg total) by mouth daily.   methocarbamol (ROBAXIN) 500 MG tablet Take 1 tablet (500 mg total) by mouth every 8 (eight) hours as needed for muscle spasms.   metroNIDAZOLE (FLAGYL) 500 MG tablet Take 1 tablet (500 mg total) by mouth 2 (two) times daily for 10 days.   orlistat (ALLI) 60 MG capsule Take 60 mg by mouth 3 (three) times daily with meals.   rosuvastatin (CRESTOR) 40 MG tablet Take 1 tablet (40 mg total) by mouth daily.   Vitamin D, Ergocalciferol, (DRISDOL) 1.25 MG (50000 UNIT) CAPS capsule Take 1 capsule (50,000 Units total) by mouth every 7 (seven) days.   No facility-administered encounter medications on file as of 10/15/2022.    Surgical History: Past Surgical History:  Procedure Laterality Date   BIOPSY  10/31/2019   Procedure: BIOPSY;  Surgeon: Rush Landmark Telford Nab., MD;  Location: [place];  Service: Gastroenterology;;   CARDIAC CATHETERIZATION     CHOLECYSTECTOMY     COLONOSCOPY WITH PROPOFOL N/A 06/13/2019   Procedure: COLONOSCOPY WITH PROPOFOL;  Surgeon: [name], MD;  Location: [facility] ENDOSCOPY;  Service: Gastroenterology;  Laterality: N/A;  COLONOSCOPY WITH PROPOFOL N/A 08/12/2019   Procedure: COLONOSCOPY WITH PROPOFOL;  Surgeon: [name], MD;  Location: [facility] ENDOSCOPY;  Service: Gastroenterology;  Laterality: N/A;   COLONOSCOPY WITH PROPOFOL N/A 09/19/2019   Procedure: COLONOSCOPY WITH PROPOFOL;  Surgeon: [name], MD;  Location: [facility] ENDOSCOPY;  Service: Gastroenterology;  Laterality: N/A;   COLONOSCOPY WITH PROPOFOL N/A 06/04/2021   Procedure: COLONOSCOPY WITH PROPOFOL;  Surgeon: [name], MD;  Location: [facility] ENDOSCOPY;  Service:  Gastroenterology;  Laterality: N/A;   DILATION AND CURETTAGE OF UTERUS     ESOPHAGOGASTRODUODENOSCOPY (EGD) WITH PROPOFOL N/A 06/13/2019   Procedure: ESOPHAGOGASTRODUODENOSCOPY (EGD) WITH PROPOFOL;  Surgeon: [name], MD;  Location: [facility] ENDOSCOPY;  Service: Gastroenterology;  Laterality: N/A;  Pt will go for COVID test on [id number] as she uses public transportation and will be in the area on that day.    ESOPHAGOGASTRODUODENOSCOPY (EGD) WITH PROPOFOL N/A 10/31/2019   Procedure: ESOPHAGOGASTRODUODENOSCOPY (EGD) WITH PROPOFOL;  Surgeon: Rush Landmark Telford Nab., MD;  Location: [place];  Service: Gastroenterology;  Laterality: N/A;   ESOPHAGOGASTRODUODENOSCOPY (EGD) WITH PROPOFOL N/A 06/04/2021   Procedure: ESOPHAGOGASTRODUODENOSCOPY (EGD) WITH PROPOFOL;  Surgeon: [name], MD;  Location: [facility] ENDOSCOPY;  Service: Gastroenterology;  Laterality: N/A;   HEMOSTASIS CLIP PLACEMENT  10/31/2019   Procedure: HEMOSTASIS CLIP PLACEMENT;  Surgeon: Irving Copas., MD;  Location: [place];  Service: Gastroenterology;;   HERNIA REPAIR     HYSTEROSCOPY WITH D & C N/A 02/16/2017   Procedure: DILATATION AND CURETTAGE /HYSTEROSCOPY;  Surgeon: Brayton Mars, MD;  Location: [facility] ORS;  Service: Gynecology;  Laterality: N/A;   JOINT REPLACEMENT Left    knee   kidney removal Left    KIDNEY SURGERY Left 1979   left kidney removed   LEFT HEART CATH AND CORONARY ANGIOGRAPHY N/A 10/21/2017   Procedure: LEFT HEART CATH AND CORONARY ANGIOGRAPHY;  Surgeon: Teodoro Spray, MD;  Location: [place] [facility];  Service: Cardiovascular;  Laterality: N/A;   POLYPECTOMY  10/31/2019   Procedure: POLYPECTOMY;  Surgeon: Rush Landmark Telford Nab., MD;  Location: [place];  Service: Gastroenterology;;   TOTAL KNEE ARTHROPLASTY Left 07/06/2017   Procedure: TOTAL KNEE ARTHROPLASTY;  Surgeon: Lovell Sheehan, MD;  Location: [facility];  Service: Orthopedics;  Laterality: Left;   TOTAL KNEE ARTHROPLASTY Right  02/25/2021   Procedure: TOTAL KNEE ARTHROPLASTY;  Surgeon: Lovell Sheehan, MD;  Location: [facility];  Service: Orthopedics;  Laterality: Right;   UPPER ESOPHAGEAL ENDOSCOPIC ULTRASOUND (EUS) N/A 10/31/2019   Procedure: UPPER ESOPHAGEAL ENDOSCOPIC ULTRASOUND (EUS);  Surgeon: Irving Copas., MD;  Location: [place];  Service: Gastroenterology;  Laterality: N/A;   WRIST ARTHROSCOPY Left 1970s   XI ROBOTIC ASSISTED VENTRAL HERNIA N/A 12/01/2019   Procedure: XI ROBOTIC ASSISTED VENTRAL HERNIA;  Surgeon: Jules Husbands, MD;  Location: [facility];  Service: General;  Laterality: N/A;    Medical History: Past Medical History:  Diagnosis Date   Abnormal Pap smear of cervix    Pt states she had colposcopy    Anxiety    Aortic atherosclerosis (HCC)    Arthritis    CAD (coronary artery disease)    Chronic kidney disease    s/p LEFT nephrectomy   Chronic lower back pain    COVID    Depression    Diverticulitis    Dyspnea    History of methicillin resistant staphylococcus aureus (MRSA) 12/21/2020   nasal swab pcr   Hx of unilateral nephrectomy 1979   Left Nephrectomy   Hypertension  Lower extremity edema    PAF (paroxysmal atrial fibrillation) (HCC)    Palpitations    PMB (postmenopausal bleeding)    Restless leg syndrome    Sleep apnea    has no cpap   Symptomatic bradycardia    Vitamin D deficiency     Family History: Family History  Problem Relation Age of Onset   Ovarian cancer Mother    Hypertension Father    Diabetes Father    Cancer Father    Breast cancer Sister    Diabetes Sister     Social History   Socioeconomic History   Marital status: Divorced    Spouse name: Not on file   Number of children: Not on file   Years of education: Not on file   Highest education level: Not on file  Occupational History   Not on file  Tobacco Use   Smoking status: Never   Smokeless tobacco: Never  Vaping Use   Vaping Use: Never used  Substance and Sexual Activity    Alcohol use: Yes    Alcohol/week: 1.0 standard drink of alcohol    Types: 1 Standard drinks or equivalent per week    Comment: occasionally   Drug use: Yes    Frequency: 3.0 times per week    Types: Marijuana    Comment: 07/09/20 Pt states she uses for leg pain: marijuana; states no longer uses cocaine   Sexual activity: Yes    Birth control/protection: Post-menopausal  Other Topics Concern   Not on file  Social History Narrative   Lives with cousin.   Social Determinants of Health   Financial Resource Strain: Low Risk  (03/22/2021)   Overall Financial Resource Strain (CARDIA)    Difficulty of Paying Living Expenses: Not very hard  Food Insecurity: Not on file  Transportation Needs: Not on file  Physical Activity: Not on file  Stress: Not on file  Social Connections: Not on file  Intimate Partner Violence: Not on file      Review of Systems  Constitutional:  Positive for appetite change and fatigue.  Respiratory: Negative.  Negative for cough, chest tightness, shortness of breath and wheezing.   Cardiovascular:  Negative for chest pain and palpitations.  Gastrointestinal:  Positive for abdominal distention, abdominal pain, constipation and diarrhea. Negative for nausea and vomiting.  Neurological:  Positive for headaches.    Vital Signs: BP (!) 148/81   Pulse 76   Temp 98.3 F (36.8 C)   Resp 16   Ht '5\' 5"'$  (1.651 m)   Wt 243 lb (110.2 kg)   LMP 01/26/2017 Comment: age 68  BMI 40.44 kg/m    Observation/Objective: She is alert and oriented and engages in conversation appropriately. No acute distress noted.     Assessment/Plan: 1. Acute diverticulitis Antibiotics prescribed and patient instructed to continue liquid diet for a few days. Patient instructed to go to ER if symptoms do not improve or get worse.  - metroNIDAZOLE (FLAGYL) 500 MG tablet; Take 1 tablet (500 mg total) by mouth 2 (two) times daily for 10 days.  Dispense: 20 tablet; Refill: 0 -  ciprofloxacin (CIPRO) 500 MG tablet; Take 1 tablet (500 mg total) by mouth 2 (two) times daily for 10 days.  Dispense: 20 tablet; Refill: 0   General Counseling: Jerniyah verbalizes understanding of the findings of today's phone visit and agrees with plan of treatment. I have discussed any further diagnostic evaluation that may be needed or ordered today. We also reviewed her medications  today. she has been encouraged to call the office with any questions or concerns that should arise related to todays visit.  Return if symptoms worsen or fail to improve.   No orders of the defined types were placed in this encounter.   Meds ordered this encounter  Medications   metroNIDAZOLE (FLAGYL) 500 MG tablet    Sig: Take 1 tablet (500 mg total) by mouth 2 (two) times daily for 10 days.    Dispense:  20 tablet    Refill:  0   ciprofloxacin (CIPRO) 500 MG tablet    Sig: Take 1 tablet (500 mg total) by mouth 2 (two) times daily for 10 days.    Dispense:  20 tablet    Refill:  0    Time spent:10 Minutes Time spent with patient included reviewing progress notes, labs, imaging studies, and discussing plan for follow up.  [place] Controlled Substance Database was reviewed by me for overdose risk score (ORS) if appropriate.  This patient was seen by Jonetta Osgood, FNP-C in collaboration with Dr. Clayborn Bigness as a part of collaborative care agreement.  Loic Hobin R. Valetta Fuller, MSN, FNP-C Internal medicine

## 2022-10-18 DIAGNOSIS — Z905 Acquired absence of kidney: Secondary | ICD-10-CM | POA: Diagnosis not present

## 2022-10-18 DIAGNOSIS — M549 Dorsalgia, unspecified: Secondary | ICD-10-CM | POA: Diagnosis not present

## 2022-10-18 DIAGNOSIS — I4891 Unspecified atrial fibrillation: Secondary | ICD-10-CM | POA: Diagnosis not present

## 2022-10-18 DIAGNOSIS — N183 Chronic kidney disease, stage 3 unspecified: Secondary | ICD-10-CM | POA: Diagnosis not present

## 2022-10-18 DIAGNOSIS — G4733 Obstructive sleep apnea (adult) (pediatric): Secondary | ICD-10-CM | POA: Diagnosis not present

## 2022-10-18 DIAGNOSIS — I129 Hypertensive chronic kidney disease with stage 1 through stage 4 chronic kidney disease, or unspecified chronic kidney disease: Secondary | ICD-10-CM | POA: Diagnosis not present

## 2022-10-18 DIAGNOSIS — I10 Essential (primary) hypertension: Secondary | ICD-10-CM | POA: Diagnosis not present

## 2022-10-18 DIAGNOSIS — Z9049 Acquired absence of other specified parts of digestive tract: Secondary | ICD-10-CM | POA: Diagnosis not present

## 2022-10-18 DIAGNOSIS — K5792 Diverticulitis of intestine, part unspecified, without perforation or abscess without bleeding: Secondary | ICD-10-CM | POA: Diagnosis not present

## 2022-10-18 DIAGNOSIS — N179 Acute kidney failure, unspecified: Secondary | ICD-10-CM | POA: Diagnosis not present

## 2022-10-18 DIAGNOSIS — K5732 Diverticulitis of large intestine without perforation or abscess without bleeding: Secondary | ICD-10-CM | POA: Diagnosis not present

## 2022-10-18 DIAGNOSIS — Z79899 Other long term (current) drug therapy: Secondary | ICD-10-CM | POA: Diagnosis not present

## 2022-10-18 DIAGNOSIS — E860 Dehydration: Secondary | ICD-10-CM | POA: Diagnosis not present

## 2022-10-18 DIAGNOSIS — E785 Hyperlipidemia, unspecified: Secondary | ICD-10-CM | POA: Diagnosis not present

## 2022-10-18 DIAGNOSIS — K5780 Diverticulitis of intestine, part unspecified, with perforation and abscess without bleeding: Secondary | ICD-10-CM | POA: Diagnosis not present

## 2022-10-23 DIAGNOSIS — K5792 Diverticulitis of intestine, part unspecified, without perforation or abscess without bleeding: Secondary | ICD-10-CM | POA: Insufficient documentation

## 2022-10-29 ENCOUNTER — Ambulatory Visit
Admission: RE | Admit: 2022-10-29 | Discharge: 2022-10-29 | Disposition: A | Discharge status: Home / Self Care | Payer: 59 | Source: Ambulatory Visit | Attending: Nurse Practitioner | Admitting: Nurse Practitioner

## 2022-10-29 DIAGNOSIS — Z1231 Encounter for screening mammogram for malignant neoplasm of breast: Secondary | ICD-10-CM

## 2022-10-29 DIAGNOSIS — M19012 Primary osteoarthritis, left shoulder: Secondary | ICD-10-CM | POA: Diagnosis not present

## 2022-10-29 DIAGNOSIS — N2581 Secondary hyperparathyroidism of renal origin: Secondary | ICD-10-CM | POA: Diagnosis not present

## 2022-10-29 DIAGNOSIS — I10 Essential (primary) hypertension: Secondary | ICD-10-CM | POA: Diagnosis not present

## 2022-10-29 DIAGNOSIS — R809 Proteinuria, unspecified: Secondary | ICD-10-CM | POA: Diagnosis not present

## 2022-10-29 DIAGNOSIS — N1832 Chronic kidney disease, stage 3b: Secondary | ICD-10-CM | POA: Diagnosis not present

## 2022-11-11 ENCOUNTER — Ambulatory Visit (INDEPENDENT_AMBULATORY_CARE_PROVIDER_SITE_OTHER): Payer: 59 | Admitting: Nurse Practitioner

## 2022-11-11 ENCOUNTER — Encounter: Payer: Self-pay | Admitting: Nurse Practitioner

## 2022-11-11 VITALS — BP 166/95 | HR 75 | Temp 97.2°F | Resp 16 | Ht 65.0 in | Wt 254.0 lb

## 2022-11-11 DIAGNOSIS — G2581 Restless legs syndrome: Secondary | ICD-10-CM | POA: Diagnosis not present

## 2022-11-11 DIAGNOSIS — K5792 Diverticulitis of intestine, part unspecified, without perforation or abscess without bleeding: Secondary | ICD-10-CM | POA: Diagnosis not present

## 2022-11-11 DIAGNOSIS — Z09 Encounter for follow-up examination after completed treatment for conditions other than malignant neoplasm: Secondary | ICD-10-CM | POA: Diagnosis not present

## 2022-11-11 DIAGNOSIS — Z9181 History of falling: Secondary | ICD-10-CM

## 2022-11-11 DIAGNOSIS — M79671 Pain in right foot: Secondary | ICD-10-CM

## 2022-11-11 MED ORDER — ROPINIROLE HCL 0.5 MG PO TABS: 0.5000 mg | ORAL_TABLET | Freq: Every day | ORAL | 3 refills | Status: DC

## 2022-11-11 NOTE — Progress Notes (Signed)
[facility] [place], [place] [address] [place] [address] [phone]                                   Transitional [facility]   [facility] Discharge Acute Issues Care Follow Up                                                                        Patient Demographics  Whitney [name], is a 72 y.o. female  DOB 1951-06-13  MRN [id number].  Primary MD  Sallyanne Kuster, NP  Admit date: 10/18/2022  Discharge date: 10/23/2022   Reason for TCC follow Up - diverticulitis   Past Medical History:  Diagnosis Date   Abnormal Pap smear of cervix    Pt states she had colposcopy    Anxiety    Aortic atherosclerosis    Arthritis    CAD (coronary artery disease)    Chronic kidney disease    s/p LEFT nephrectomy   Chronic lower back pain    COVID    Depression    Diverticulitis    Dyspnea    History of methicillin resistant staphylococcus aureus (MRSA) 12/21/2020   nasal swab pcr   Hx of unilateral nephrectomy 1979   Left Nephrectomy   Hypertension    Lower extremity edema    PAF (paroxysmal atrial fibrillation)    Palpitations    PMB (postmenopausal bleeding)    Restless leg syndrome    Sleep apnea    has no cpap   Symptomatic bradycardia    Vitamin D deficiency     Past Surgical History:  Procedure Laterality Date   BIOPSY  10/31/2019   Procedure: BIOPSY;  Surgeon: Lemar Lofty., MD;  Location: [facility] ENDOSCOPY;  Service: Gastroenterology;;   CARDIAC CATHETERIZATION     CHOLECYSTECTOMY     COLONOSCOPY WITH PROPOFOL N/A 06/13/2019   Procedure: COLONOSCOPY WITH PROPOFOL;  Surgeon: Wyline Mood, MD;  Location: [facility] ENDOSCOPY;  Service: Gastroenterology;  Laterality: N/A;   COLONOSCOPY WITH PROPOFOL N/A 08/12/2019   Procedure: COLONOSCOPY WITH PROPOFOL;  Surgeon: Wyline Mood, MD;  Location: [facility] ENDOSCOPY;  Service: Gastroenterology;  Laterality: N/A;   COLONOSCOPY WITH PROPOFOL N/A 09/19/2019   Procedure: COLONOSCOPY WITH PROPOFOL;   Surgeon: Wyline Mood, MD;  Location: [facility] ENDOSCOPY;  Service: Gastroenterology;  Laterality: N/A;   COLONOSCOPY WITH PROPOFOL N/A 06/04/2021   Procedure: COLONOSCOPY WITH PROPOFOL;  Surgeon: Wyline Mood, MD;  Location: [facility] ENDOSCOPY;  Service: Gastroenterology;  Laterality: N/A;   DILATION AND CURETTAGE OF UTERUS     ESOPHAGOGASTRODUODENOSCOPY (EGD) WITH PROPOFOL N/A 06/13/2019   Procedure: ESOPHAGOGASTRODUODENOSCOPY (EGD) WITH PROPOFOL;  Surgeon: Wyline Mood, MD;  Location: [facility] ENDOSCOPY;  Service: Gastroenterology;  Laterality: N/A;  Pt will go for COVID test on 06-08-19 as she uses public transportation and will be in the area on that day.    ESOPHAGOGASTRODUODENOSCOPY (EGD) WITH PROPOFOL N/A 10/31/2019   Procedure: ESOPHAGOGASTRODUODENOSCOPY (EGD) WITH PROPOFOL;  Surgeon: Meridee Score Netty Starring., MD;  Location: [facility] ENDOSCOPY;  Service: Gastroenterology;  Laterality: N/A;   ESOPHAGOGASTRODUODENOSCOPY (EGD) WITH PROPOFOL N/A 06/04/2021   Procedure: ESOPHAGOGASTRODUODENOSCOPY (EGD) WITH PROPOFOL;  Surgeon: Wyline Mood, MD;  Location: [facility] ENDOSCOPY;  Service: Gastroenterology;  Laterality: N/A;   HEMOSTASIS CLIP PLACEMENT  10/31/2019   Procedure: HEMOSTASIS CLIP PLACEMENT;  Surgeon: Lemar Lofty., MD;  Location: [facility] ENDOSCOPY;  Service: Gastroenterology;;   HERNIA REPAIR     HYSTEROSCOPY WITH D & C N/A 02/16/2017   Procedure: DILATATION AND CURETTAGE /HYSTEROSCOPY;  Surgeon: Herold Harms, MD;  Location: [facility] ORS;  Service: Gynecology;  Laterality: N/A;   JOINT REPLACEMENT Left    knee   kidney removal Left    KIDNEY SURGERY Left 1979   left kidney removed   LEFT HEART CATH AND CORONARY ANGIOGRAPHY N/A 10/21/2017   Procedure: LEFT HEART CATH AND CORONARY ANGIOGRAPHY;  Surgeon: Dalia Heading, MD;  Location: [facility] INVASIVE CV LAB;  Service: Cardiovascular;  Laterality: N/A;   POLYPECTOMY  10/31/2019   Procedure: POLYPECTOMY;  Surgeon: Meridee Score Netty Starring., MD;  Location: [facility]  ENDOSCOPY;  Service: Gastroenterology;;   TOTAL KNEE ARTHROPLASTY Left 07/06/2017   Procedure: TOTAL KNEE ARTHROPLASTY;  Surgeon: Lyndle Herrlich, MD;  Location: [facility] ORS;  Service: Orthopedics;  Laterality: Left;   TOTAL KNEE ARTHROPLASTY Right 02/25/2021   Procedure: TOTAL KNEE ARTHROPLASTY;  Surgeon: Lyndle Herrlich, MD;  Location: [facility] ORS;  Service: Orthopedics;  Laterality: Right;   UPPER ESOPHAGEAL ENDOSCOPIC ULTRASOUND (EUS) N/A 10/31/2019   Procedure: UPPER ESOPHAGEAL ENDOSCOPIC ULTRASOUND (EUS);  Surgeon: Lemar Lofty., MD;  Location: [facility] ENDOSCOPY;  Service: Gastroenterology;  Laterality: N/A;   WRIST ARTHROSCOPY Left 1970s   XI ROBOTIC ASSISTED VENTRAL HERNIA N/A 12/01/2019   Procedure: XI ROBOTIC ASSISTED VENTRAL HERNIA;  Surgeon: Leafy Ro, MD;  Location: [facility] ORS;  Service: General;  Laterality: N/A;       Recent HPI and Hospital Course  Hospital Course: For full details, please see H&P, progress notes, consult notes and ancillary notes. Briefly, Karlynn Furrow is a 72 y.o. female with a PMHx of diverticulitis, CKD III s/p left nephrectomy, HTN, HLD, AF not on anticoagulation, OSA and asthma who presents from home via EMS for LLQ abdominal pain, nausea and vomiting found to have acute diverticulitis of sigmoid colon. Hospital course to be discussed in problem based format  #Acute Diverticulitis Patient presented to the ED with left sided abdominal pain and flank pain found to have diverticulitis. On arrival patient HDS and afebrile without leukocytosis. CT A/P W revealed acute uncomplicated sigmoid colon diverticulitis. Zosyn was started in the ED on 3/17. Patient was then admitted and blood cultures were drawn. Pain was well controlled with oxycodone. Given improvement in symptoms and new AKI (See below) zosyn was transitioned to ciprofloxacin and flagyl. Patient had waxing and waning abdominal pain, that was improved with bowel movements on this new regimen. As patient  had symptomatic improvement with no new signs of systemic infection, patient was discharged with instructions to continue cipro/flagyl for total of 14 day course since admission.  AKI on CKD III, likely contrast and zosyn induced Patient creatinine of 1.8 on arrival with baseline of 1.4. Originally attributed to pre-renal ISO dehydration and GI losses. Patient fluid resuscitated, but creatinine continued to uptrend ISO zosyn and receiving IV contrast exposure. Zosyn was de-escalated (see above) and creatinine began to trend towards normal. MIVF was weaned and patient's creatinine continued to improve with PO intake. On discharge creatinine was 1.4   Post Hospital Acute Care Issue to be followed in the Clinic   Diverticulitis [K57.92] Abdominal pain [R10.9]    Subjective:   Shabana Armentrout today has, No headache,  No chest pain, No abdominal pain - No Nausea, No new weakness tingling or numbness, No Cough - SOB.   Assessment & Plan    1. Hospital discharge follow-up Treated in hospital with IV antibiotics and discharged with cipro  2. Acute diverticulitis Complete antibiotics that were prescribed.   3. History of recent fall Fell since hospital discharge, swelling and bruising to right foot, need to rule out fracture, xray ordered - DG Foot Complete Right; Future  4. Acute foot pain, right Xray ordered - DG Foot Complete Right; Future  5. Restless leg syndrome Continue ropinirole as prescribed  Reason for frequent admissions/ER visits    diverticulosis/diverticulitis Afib Hypertension CAD Asthma CKD    Objective:   Vitals:   11/11/22 1126  BP: (!) 166/95  Pulse: 75  Resp: 16  Temp: (!) 97.2 F (36.2 C)  SpO2: 98%  Weight: 254 lb (115.2 kg)  Height: 5\' 5"  (1.651 m)    Wt Readings from Last 3 Encounters:  11/11/22 254 lb (115.2 kg)  10/15/22 243 lb (110.2 kg)  09/01/22 256 lb 9.6 oz (116.4 kg)    Allergies as of 11/11/2022       Reactions   Enalapril  Swelling, Other (See Comments)   Lisinopril Swelling   Ace Inhibitors Swelling   And ARB        Medication List        Accurate as of November 11, 2022 12:22 PM. If you have any questions, ask your nurse or doctor.          albuterol 108 (90 Base) MCG/ACT inhaler Commonly known as: VENTOLIN HFA Inhale 2 puffs into the lungs every 4 (four) hours as needed for wheezing or shortness of breath.   amLODipine 5 MG tablet Commonly known as: NORVASC Take 1 tablet (5 mg total) by mouth daily.   aspirin 81 MG chewable tablet Chew 1 tablet (81 mg total) by mouth 2 (two) times daily.   carvedilol 12.5 MG tablet Commonly known as: COREG Take 1 tablet by mouth 2 (two) times daily.   diclofenac Sodium 1 % Gel Commonly known as: VOLTAREN Apply 2 g topically daily as needed (Knee pain).   docusate sodium 100 MG capsule Commonly known as: COLACE Take 1 capsule (100 mg total) by mouth 2 (two) times daily.   ezetimibe 10 MG tablet Commonly known as: Zetia Take 1 tablet (10 mg total) by mouth daily.   gabapentin 100 MG capsule Commonly known as: NEURONTIN Take 200 mg by mouth 2 (two) times daily.   hydrALAZINE 25 MG tablet Commonly known as: APRESOLINE Take 1 tablet (25 mg total) by mouth 3 (three) times daily. For HTN   magnesium oxide 400 MG tablet Commonly known as: MAG-OX Take 1 tablet (400 mg total) by mouth daily.   methocarbamol 500 MG tablet Commonly known as: ROBAXIN Take 1 tablet (500 mg total) by mouth every 8 (eight) hours as needed for muscle spasms.   orlistat 60 MG capsule Commonly known as: ALLI Take 60 mg by mouth 3 (three) times daily with meals.   rOPINIRole 0.5 MG tablet Commonly known as: REQUIP Take 1 tablet (0.5 mg total) by mouth at bedtime. Started by: Sallyanne Kuster, NP   rosuvastatin 40 MG tablet Commonly known as: CRESTOR Take 1 tablet (40 mg total) by mouth daily.   Vitamin D (Ergocalciferol) 1.25 MG (50000 UNIT) Caps capsule Commonly  known as: DRISDOL Take 1 capsule (50,000 Units total) by mouth every 7 (seven) days.  Physical Exam: Constitutional: Patient appears well-developed and well-nourished. Not in obvious distress. HENT: Normocephalic, atraumatic, External right and left ear normal. Oropharynx is clear and moist.  Eyes: Conjunctivae and EOM are normal. PERRLA, no scleral icterus. Neck: Normal ROM. Neck supple. No JVD. No tracheal deviation. No thyromegaly. CVS: RRR, S1/S2 +, no murmurs, no gallops, no carotid bruit.  Pulmonary: Effort and breath sounds normal, no stridor, rhonchi, wheezes, rales.  Abdominal: Soft. BS +, no distension, tenderness, rebound or guarding.  Musculoskeletal: Normal range of motion. No edema and no tenderness.  Lymphadenopathy: No lymphadenopathy noted, cervical, inguinal or axillary Neuro: Alert. Normal reflexes, muscle tone coordination. No cranial nerve deficit. Skin: Skin is warm and dry. No rash noted. Not diaphoretic. No erythema. No pallor. Psychiatric: Normal mood and affect. Behavior, judgment, thought content normal.   Data Review   Micro Results No results found for this or any previous visit (from the past 240 hour(s)).   CBC No results for input(s): "WBC", "HGB", "HCT", "PLT", "MCV", "MCH", "MCHC", "RDW", "LYMPHSABS", "MONOABS", "EOSABS", "BASOSABS", "BANDABS" in the last 168 hours.  Invalid input(s): "NEUTRABS", "BANDSABD"  Chemistries  No results for input(s): "NA", "K", "CL", "CO2", "GLUCOSE", "BUN", "CREATININE", "CALCIUM", "MG", "AST", "ALT", "ALKPHOS", "BILITOT" in the last 168 hours.  Invalid input(s): "GFRCGP" ------------------------------------------------------------------------------------------------------------------ CrCl cannot be calculated (Patient's most recent lab result is older than the maximum 21 days allowed.). ------------------------------------------------------------------------------------------------------------------ No  results for input(s): "HGBA1C" in the last 72 hours. ------------------------------------------------------------------------------------------------------------------ No results for input(s): "CHOL", "HDL", "LDLCALC", "TRIG", "CHOLHDL", "LDLDIRECT" in the last 72 hours. ------------------------------------------------------------------------------------------------------------------ No results for input(s): "TSH", "T4TOTAL", "T3FREE", "THYROIDAB" in the last 72 hours.  Invalid input(s): "FREET3" ------------------------------------------------------------------------------------------------------------------ No results for input(s): "VITAMINB12", "FOLATE", "FERRITIN", "TIBC", "IRON", "RETICCTPCT" in the last 72 hours.  Coagulation profile No results for input(s): "INR", "PROTIME" in the last 168 hours.  No results for input(s): "DDIMER" in the last 72 hours.  Cardiac Enzymes No results for input(s): "CKMB", "TROPONINI", "MYOGLOBIN" in the last 168 hours.  Invalid input(s): "CK" ------------------------------------------------------------------------------------------------------------------ Invalid input(s): "POCBNP"  Return for reschedule 4/29 follow up for 3 months out. .   Time Spent in minutes  45 Time spent with patient included reviewing progress notes, labs, imaging studies, and discussing plan for follow up.   This patient was seen by Sallyanne Kuster, FNP-C in collaboration with Dr. Beverely Risen as a part of collaborative care agreement.    Sallyanne Kuster MSN, FNP-C on 11/11/2022 at 12:22 PM   **Disclaimer: This note may have been dictated with voice recognition software. Similar sounding words can inadvertently be transcribed and this note may contain transcription errors which may not have been corrected upon publication of note.**

## 2022-11-14 DIAGNOSIS — S96911A Strain of unspecified muscle and tendon at ankle and foot level, right foot, initial encounter: Secondary | ICD-10-CM | POA: Diagnosis not present

## 2022-11-22 ENCOUNTER — Encounter: Payer: Self-pay | Admitting: Nurse Practitioner

## 2022-12-01 ENCOUNTER — Ambulatory Visit: Payer: 59 | Admitting: Nurse Practitioner

## 2022-12-10 DIAGNOSIS — M19012 Primary osteoarthritis, left shoulder: Secondary | ICD-10-CM | POA: Diagnosis not present

## 2022-12-22 DIAGNOSIS — M792 Neuralgia and neuritis, unspecified: Secondary | ICD-10-CM | POA: Diagnosis not present

## 2022-12-22 DIAGNOSIS — R262 Difficulty in walking, not elsewhere classified: Secondary | ICD-10-CM | POA: Diagnosis not present

## 2022-12-22 DIAGNOSIS — M7741 Metatarsalgia, right foot: Secondary | ICD-10-CM | POA: Diagnosis not present

## 2023-01-02 DIAGNOSIS — M7542 Impingement syndrome of left shoulder: Secondary | ICD-10-CM | POA: Diagnosis not present

## 2023-01-02 DIAGNOSIS — M79671 Pain in right foot: Secondary | ICD-10-CM | POA: Diagnosis not present

## 2023-02-10 ENCOUNTER — Ambulatory Visit: Payer: 59 | Admitting: Nurse Practitioner

## 2023-02-25 DIAGNOSIS — R809 Proteinuria, unspecified: Secondary | ICD-10-CM | POA: Diagnosis not present

## 2023-02-25 DIAGNOSIS — I10 Essential (primary) hypertension: Secondary | ICD-10-CM | POA: Diagnosis not present

## 2023-02-25 DIAGNOSIS — N1832 Chronic kidney disease, stage 3b: Secondary | ICD-10-CM | POA: Diagnosis not present

## 2023-02-25 DIAGNOSIS — N2581 Secondary hyperparathyroidism of renal origin: Secondary | ICD-10-CM | POA: Diagnosis not present

## 2023-03-11 ENCOUNTER — Encounter: Payer: Self-pay | Admitting: Nurse Practitioner

## 2023-03-11 ENCOUNTER — Ambulatory Visit: Payer: 59 | Admitting: Nurse Practitioner

## 2023-03-11 VITALS — BP 170/90 | HR 86 | Temp 98.2°F | Resp 16 | Ht 65.0 in | Wt 246.0 lb

## 2023-03-11 DIAGNOSIS — N2581 Secondary hyperparathyroidism of renal origin: Secondary | ICD-10-CM | POA: Diagnosis not present

## 2023-03-11 DIAGNOSIS — I700 Atherosclerosis of aorta: Secondary | ICD-10-CM | POA: Diagnosis not present

## 2023-03-11 DIAGNOSIS — E559 Vitamin D deficiency, unspecified: Secondary | ICD-10-CM | POA: Diagnosis not present

## 2023-03-11 DIAGNOSIS — R600 Localized edema: Secondary | ICD-10-CM | POA: Diagnosis not present

## 2023-03-11 DIAGNOSIS — R7303 Prediabetes: Secondary | ICD-10-CM

## 2023-03-11 DIAGNOSIS — Z23 Encounter for immunization: Secondary | ICD-10-CM | POA: Diagnosis not present

## 2023-03-11 DIAGNOSIS — R0602 Shortness of breath: Secondary | ICD-10-CM | POA: Diagnosis not present

## 2023-03-11 DIAGNOSIS — I480 Paroxysmal atrial fibrillation: Secondary | ICD-10-CM | POA: Diagnosis not present

## 2023-03-11 DIAGNOSIS — I493 Ventricular premature depolarization: Secondary | ICD-10-CM | POA: Diagnosis not present

## 2023-03-11 DIAGNOSIS — R29898 Other symptoms and signs involving the musculoskeletal system: Secondary | ICD-10-CM | POA: Diagnosis not present

## 2023-03-11 DIAGNOSIS — I10 Essential (primary) hypertension: Secondary | ICD-10-CM | POA: Diagnosis not present

## 2023-03-11 DIAGNOSIS — Z0001 Encounter for general adult medical examination with abnormal findings: Secondary | ICD-10-CM

## 2023-03-11 DIAGNOSIS — N1832 Chronic kidney disease, stage 3b: Secondary | ICD-10-CM | POA: Diagnosis not present

## 2023-03-11 DIAGNOSIS — R300 Dysuria: Secondary | ICD-10-CM | POA: Diagnosis not present

## 2023-03-11 DIAGNOSIS — G4733 Obstructive sleep apnea (adult) (pediatric): Secondary | ICD-10-CM | POA: Diagnosis not present

## 2023-03-11 DIAGNOSIS — J4520 Mild intermittent asthma, uncomplicated: Secondary | ICD-10-CM | POA: Diagnosis not present

## 2023-03-11 MED ORDER — ZOSTER VAC RECOMB ADJUVANTED 50 MCG/0.5ML IM SUSR: 0.5000 mL | Freq: Once | INTRAMUSCULAR | 0 refills | Status: AC

## 2023-03-11 MED ORDER — PNEUMOCOCCAL 20-VAL CONJ VACC 0.5 ML IM SUSY: 0.5000 mL | PREFILLED_SYRINGE | INTRAMUSCULAR | 0 refills | Status: AC

## 2023-03-11 MED ORDER — AMLODIPINE BESYLATE 10 MG PO TABS
10.0000 mg | ORAL_TABLET | Freq: Every day | ORAL | 1 refills | Status: DC
Start: 2023-03-11 — End: 2023-09-24

## 2023-03-11 MED ORDER — TETANUS-DIPHTH-ACELL PERTUSSIS 5-2.5-18.5 LF-MCG/0.5 IM SUSP
0.5000 mL | Freq: Once | INTRAMUSCULAR | 0 refills | Status: AC
Start: 2023-03-11 — End: 2023-03-11

## 2023-03-11 NOTE — Progress Notes (Signed)
[facility] [address] [place], [place] [address]  Internal MEDICINE  Office Visit Note  Patient Name: Whitney [name]  [id number]  [id number]  Date of Service: 03/11/2023  Chief Complaint  Patient presents with   Depression   Hypertension   Medicare Wellness    HPI Amorina presents for an annual well visit and physical exam.  Well-appearing 72 y.o. female with aortic atherosclerosis, CAD, hypertension, AFIB, OSA, asthma, gallstones, diverticulosis, osteoarthritis, lumbar spondylosis, CKD, has single kidney, s/p left nephrectomy, anxiety and depression, insomnia and RLS.  Routine CRC screening: due in 2032 Routine mammogram: due in march 2025 DEXA scan: done August 2022, result was normal, can repeat every 2 years if needed.  Labs: due for routine labs New or worsening pain: nothing new, chronic pains losing weight, going to physical therapy. Walking more.        03/11/2023    1:46 PM 11/12/2020   11:09 AM 11/12/2020   11:07 AM  MMSE - Mini Mental State Exam  Orientation to time 5 5 5   Orientation to Place 5 5 5   Registration 3 3 3   Attention/ Calculation 5 5 5   Recall 3 3 3   Language- name 2 objects 2 2 2   Language- repeat 1 1 1   Language- follow 3 step command 3 3 3   Language- read & follow direction 1 1 1   Write a sentence 0 0 0  Copy design 1 1 1   Total score 29 29 29     Functional Status Survey: Is the patient deaf or have difficulty hearing?: No Does the patient have difficulty seeing, even when wearing glasses/contacts?: No Does the patient have difficulty concentrating, remembering, or making decisions?: Yes Does the patient have difficulty walking or climbing stairs?: Yes Does the patient have difficulty dressing or bathing?: No Does the patient have difficulty doing errands alone such as visiting a doctor's office or shopping?: No     02/17/2022    3:46 PM 03/26/2022    1:56 PM 04/29/2022    8:58 AM 09/01/2022   10:06 AM 03/11/2023    1:45 PM   Fall Risk  Falls in the past year? 0 1 0 0 1  Was there an injury with Fall?  0 0 0 0  Fall Risk Category Calculator  2 0 0 1  Fall Risk Category (Retired)  Moderate Low    (RETIRED) Patient Fall Risk Level   Low fall risk    Patient at Risk for Falls Due to   No Fall Risks No Fall Risks   Fall risk Follow up   Falls evaluation completed Falls evaluation completed Falls evaluation completed       09/01/2022   10:06 AM  Depression screen PHQ 2/9  Decreased Interest 2  Down, Depressed, Hopeless 1  PHQ - 2 Score 3  Altered sleeping 1  Tired, decreased energy 1  Change in appetite 0  Feeling bad or failure about yourself  0  Trouble concentrating 1  Moving slowly or fidgety/restless 0  Suicidal thoughts 0  PHQ-9 Score 6  Difficult doing work/chores Not difficult at all        Current Medication: Outpatient Encounter Medications as of 03/11/2023  Medication Sig   albuterol (VENTOLIN HFA) 108 (90 Base) MCG/ACT inhaler Inhale 2 puffs into the lungs every 4 (four) hours as needed for wheezing or shortness of breath.   amLODipine (NORVASC) 10 MG tablet Take 1 tablet (10 mg total) by mouth daily.   aspirin 81  MG chewable tablet Chew 1 tablet (81 mg total) by mouth 2 (two) times daily.   carvedilol (COREG) 12.5 MG tablet Take 1 tablet by mouth 2 (two) times daily.   diclofenac Sodium (VOLTAREN) 1 % GEL Apply 2 g topically daily as needed (Knee pain).   docusate sodium (COLACE) 100 MG capsule Take 1 capsule (100 mg total) by mouth 2 (two) times daily.   ezetimibe (ZETIA) 10 MG tablet Take 1 tablet (10 mg total) by mouth daily.   gabapentin (NEURONTIN) 100 MG capsule Take 200 mg by mouth 2 (two) times daily.   hydrALAZINE (APRESOLINE) 25 MG tablet Take 1 tablet (25 mg total) by mouth 3 (three) times daily. For HTN   magnesium oxide (MAG-OX) 400 MG tablet Take 1 tablet (400 mg total) by mouth daily.   methocarbamol (ROBAXIN) 500 MG tablet Take 1 tablet (500 mg total) by mouth every 8  (eight) hours as needed for muscle spasms.   orlistat (ALLI) 60 MG capsule Take 60 mg by mouth 3 (three) times daily with meals.   [EXPIRED] pneumococcal 20-valent conjugate vaccine (PREVNAR 20) 0.5 ML injection Inject 0.5 mLs into the muscle tomorrow at 10 am for 1 dose.   rOPINIRole (REQUIP) 0.5 MG tablet Take 1 tablet (0.5 mg total) by mouth at bedtime.   rosuvastatin (CRESTOR) 40 MG tablet Take 1 tablet (40 mg total) by mouth daily.   Vitamin D, Ergocalciferol, (DRISDOL) 1.25 MG (50000 UNIT) CAPS capsule Take 1 capsule (50,000 Units total) by mouth every 7 (seven) days.   [DISCONTINUED] amLODipine (NORVASC) 5 MG tablet Take 1 tablet (5 mg total) by mouth daily.   [DISCONTINUED] Tdap (BOOSTRIX) 5-2.5-18.5 LF-MCG/0.5 injection Inject 0.5 mLs into the muscle once.   [DISCONTINUED] Zoster Vaccine Adjuvanted [facility]) injection Inject 0.5 mLs into the muscle once.   [EXPIRED] Tdap (BOOSTRIX) 5-2.5-18.5 LF-MCG/0.5 injection Inject 0.5 mLs into the muscle once for 1 dose.   [EXPIRED] Zoster Vaccine Adjuvanted [facility]) injection Inject 0.5 mLs into the muscle once for 1 dose.   No facility-administered encounter medications on file as of 03/11/2023.    Surgical History: Past Surgical History:  Procedure Laterality Date   BIOPSY  10/31/2019   Procedure: BIOPSY;  Surgeon: Meridee Score Netty Starring., MD;  Location: [facility] ENDOSCOPY;  Service: Gastroenterology;;   CARDIAC CATHETERIZATION     CHOLECYSTECTOMY     COLONOSCOPY WITH PROPOFOL N/A 06/13/2019   Procedure: COLONOSCOPY WITH PROPOFOL;  Surgeon: Wyline Mood, MD;  Location: [facility] ENDOSCOPY;  Service: Gastroenterology;  Laterality: N/A;   COLONOSCOPY WITH PROPOFOL N/A 08/12/2019   Procedure: COLONOSCOPY WITH PROPOFOL;  Surgeon: Wyline Mood, MD;  Location: [facility] ENDOSCOPY;  Service: Gastroenterology;  Laterality: N/A;   COLONOSCOPY WITH PROPOFOL N/A 09/19/2019   Procedure: COLONOSCOPY WITH PROPOFOL;  Surgeon: Wyline Mood, MD;  Location: [facility] ENDOSCOPY;   Service: Gastroenterology;  Laterality: N/A;   COLONOSCOPY WITH PROPOFOL N/A 06/04/2021   Procedure: COLONOSCOPY WITH PROPOFOL;  Surgeon: Wyline Mood, MD;  Location: [facility] ENDOSCOPY;  Service: Gastroenterology;  Laterality: N/A;   DILATION AND CURETTAGE OF UTERUS     ESOPHAGOGASTRODUODENOSCOPY (EGD) WITH PROPOFOL N/A 06/13/2019   Procedure: ESOPHAGOGASTRODUODENOSCOPY (EGD) WITH PROPOFOL;  Surgeon: Wyline Mood, MD;  Location: [facility] ENDOSCOPY;  Service: Gastroenterology;  Laterality: N/A;  Pt will go for COVID test on 06-08-19 as she uses public transportation and will be in the area on that day.    ESOPHAGOGASTRODUODENOSCOPY (EGD) WITH PROPOFOL N/A 10/31/2019   Procedure: ESOPHAGOGASTRODUODENOSCOPY (EGD) WITH PROPOFOL;  Surgeon: Lemar Lofty., MD;  Location: [facility] ENDOSCOPY;  Service: Gastroenterology;  Laterality: N/A;   ESOPHAGOGASTRODUODENOSCOPY (EGD) WITH PROPOFOL N/A 06/04/2021   Procedure: ESOPHAGOGASTRODUODENOSCOPY (EGD) WITH PROPOFOL;  Surgeon: Wyline Mood, MD;  [facility] ENDOSCOPY;  Service: Gastroenterology;  Laterality: N/A;   HEMOSTASIS CLIP PLACEMENT  10/31/2019   Procedure: HEMOSTASIS CLIP PLACEMENT;  Surgeon: Lemar Lofty., MD;  Location: [facility] ENDOSCOPY;  Service: Gastroenterology;;   HERNIA REPAIR     HYSTEROSCOPY WITH D & C N/A 02/16/2017   Procedure: DILATATION AND CURETTAGE /HYSTEROSCOPY;  Surgeon: Herold Harms, MD;  Location: [facility] ORS;  Service: Gynecology;  Laterality: N/A;   JOINT REPLACEMENT Left    knee   kidney removal Left    KIDNEY SURGERY Left 1979   left kidney removed   LEFT HEART CATH AND CORONARY ANGIOGRAPHY N/A 10/21/2017   Procedure: LEFT HEART CATH AND CORONARY ANGIOGRAPHY;  Surgeon: Dalia Heading, MD;  Location: [facility] INVASIVE CV LAB;  Service: Cardiovascular;  Laterality: N/A;   POLYPECTOMY  10/31/2019   Procedure: POLYPECTOMY;  Surgeon: Meridee Score Netty Starring., MD;  Location: [facility] ENDOSCOPY;  Service: Gastroenterology;;   TOTAL KNEE  ARTHROPLASTY Left 07/06/2017   Procedure: TOTAL KNEE ARTHROPLASTY;  Surgeon: Lyndle Herrlich, MD;  Location: [facility] ORS;  Service: Orthopedics;  Laterality: Left;   TOTAL KNEE ARTHROPLASTY Right 02/25/2021   Procedure: TOTAL KNEE ARTHROPLASTY;  Surgeon: Lyndle Herrlich, MD;  Location: [facility] ORS;  Service: Orthopedics;  Laterality: Right;   UPPER ESOPHAGEAL ENDOSCOPIC ULTRASOUND (EUS) N/A 10/31/2019   Procedure: UPPER ESOPHAGEAL ENDOSCOPIC ULTRASOUND (EUS);  Surgeon: Lemar Lofty., MD;  Location: [facility] ENDOSCOPY;  Service: Gastroenterology;  Laterality: N/A;   WRIST ARTHROSCOPY Left 1970s   XI ROBOTIC ASSISTED VENTRAL HERNIA N/A 12/01/2019   Procedure: XI ROBOTIC ASSISTED VENTRAL HERNIA;  Surgeon: Leafy Ro, MD;  Location: [facility] ORS;  Service: General;  Laterality: N/A;    Medical History: Past Medical History:  Diagnosis Date   Abnormal Pap smear of cervix    Pt states she had colposcopy    Anxiety    Aortic atherosclerosis (HCC)    Arthritis    CAD (coronary artery disease)    Chronic kidney disease    s/p LEFT nephrectomy   Chronic lower back pain    COVID    Depression    Diverticulitis    Dyspnea    History of methicillin resistant staphylococcus aureus (MRSA) 12/21/2020   nasal swab pcr   Hx of unilateral nephrectomy 1979   Left Nephrectomy   Hypertension    Lower extremity edema    PAF (paroxysmal atrial fibrillation) (HCC)    Palpitations    PMB (postmenopausal bleeding)    Restless leg syndrome    Sleep apnea    has no cpap   Symptomatic bradycardia    Vitamin D deficiency     Family History: Family History  Problem Relation Age of Onset   Ovarian cancer Mother    Hypertension Father    Diabetes Father    Cancer Father    Breast cancer Sister    Diabetes Sister     Social History   Socioeconomic History   Marital status: Divorced    Spouse name: Not on file   Number of children: Not on file   Years of education: Not on file   Highest education  level: Not on file  Occupational History   Not on file  Tobacco Use   Smoking status: Never   Smokeless tobacco: Never  Vaping Use  Vaping status: Never Used  Substance and Sexual Activity   Alcohol use: Yes    Alcohol/week: 1.0 standard drink of alcohol    Types: 1 Standard drinks or equivalent per week    Comment: occasionally   Drug use: Yes    Frequency: 3.0 times per week    Types: Marijuana    Comment: 07/09/20 Pt states she uses for leg pain: marijuana; states no longer uses cocaine   Sexual activity: Yes    Birth control/protection: Post-menopausal  Other Topics Concern   Not on file  Social History Narrative   Lives with cousin.   Social Determinants of Health   Financial Resource Strain: Low Risk  (03/22/2021)   Overall Financial Resource Strain (CARDIA)    Difficulty of Paying Living Expenses: Not very hard  Food Insecurity: Low Risk  (10/19/2022)   [facility]   Food vital sign    Within the past 12 months, you worried that your food would run out before you got money to buy more: Never true    Within the past 12 months, the food you bought just didn't last and you didn't have money to get more. : Never true  Transportation Needs: No Transportation Needs (10/19/2022)   [facility]   Transportation    In the past 12 months, has lack of reliable transportation kept you from medical appointments, meetings, work or from getting things needed for daily living? : No  Physical Activity: Not on file  Stress: Not on file  Social Connections: Not on file  Intimate Partner Violence: Not on file      Review of Systems  Constitutional:  Positive for appetite change, fatigue and unexpected weight change. Negative for activity change, chills and fever.  HENT: Negative.  Negative for congestion, ear pain, rhinorrhea, sore throat and trouble swallowing.   Eyes: Negative.   Respiratory: Negative.  Negative for  cough, chest tightness, shortness of breath and wheezing.   Cardiovascular: Negative.  Negative for chest pain and palpitations.  Gastrointestinal: Negative.  Negative for abdominal pain, blood in stool, constipation, diarrhea, nausea and vomiting.  Endocrine: Negative.   Genitourinary: Negative.  Negative for difficulty urinating, dysuria, frequency, hematuria and urgency.  Musculoskeletal:  Positive for arthralgias, back pain and gait problem. Negative for joint swelling, myalgias and neck pain.  Skin: Negative.  Negative for rash and wound.  Allergic/Immunologic: Negative.  Negative for immunocompromised state.  Neurological:  Positive for headaches. Negative for dizziness, seizures and numbness.  Hematological: Negative.   Psychiatric/Behavioral: Negative.  Negative for behavioral problems, self-injury and suicidal ideas. The patient is not nervous/anxious.     Vital Signs: BP (!) 170/90 Comment: 180/110  Pulse 86   Temp 98.2 F (36.8 C)   Resp 16   Ht 5\' 5"  (1.651 m)   Wt 246 lb (111.6 kg)   LMP 01/26/2017 Comment: age 17  SpO2 99%   BMI 40.94 kg/m    Physical Exam Vitals reviewed.  Constitutional:      General: She is not in acute distress.    Appearance: Normal appearance. She is obese. She is not ill-appearing.  HENT:     Head: Normocephalic and atraumatic.     Right Ear: Tympanic membrane, ear canal and external ear normal.     Left Ear: Tympanic membrane, ear canal and external ear normal.     Nose: Nose normal. No congestion or rhinorrhea.     Mouth/Throat:  Mouth: Mucous membranes are moist.     Pharynx: Oropharynx is clear. No oropharyngeal exudate or posterior oropharyngeal erythema.  Eyes:     Extraocular Movements: Extraocular movements intact.     Conjunctiva/sclera: Conjunctivae normal.     Pupils: Pupils are equal, round, and reactive to light.  Neck:     Vascular: No carotid bruit.  Cardiovascular:     Rate and Rhythm: Normal rate and regular  rhythm.     Heart sounds: Normal heart sounds. No murmur heard. Pulmonary:     Effort: Pulmonary effort is normal. No respiratory distress.     Breath sounds: Normal breath sounds. No wheezing.  Abdominal:     General: Bowel sounds are normal. There is no distension.     Palpations: Abdomen is soft. There is no mass.     Tenderness: There is no abdominal tenderness. There is no guarding or rebound.     Hernia: No hernia is present.  Musculoskeletal:        General: Normal range of motion.     Cervical back: Normal range of motion and neck supple.  Lymphadenopathy:     Cervical: No cervical adenopathy.  Skin:    General: Skin is warm and dry.     Capillary Refill: Capillary refill takes less than 2 seconds.  Neurological:     Mental Status: She is alert and oriented to person, place, and time.     Cranial Nerves: No cranial nerve deficit.     Coordination: Coordination normal.     Gait: Gait normal.  Psychiatric:        Mood and Affect: Mood normal.        Behavior: Behavior normal.        Thought Content: Thought content normal.        Judgment: Judgment normal.        Assessment/Plan: 1. Encounter for routine adult health examination with abnormal findings Age-appropriate preventive screenings and vaccinations discussed, annual physical exam completed. Routine labs for health maintenance ordered, see below. PHM updated.  - Lipid Profile - Vitamin D (25 hydroxy) - Hgb A1C w/o eAG - Ca+Creat+P+PTH Intact  2. Essential hypertension Amlodipine dose increased to 10 mg daily. Take as prescribed. Follow up in 4 weeks.  - amLODipine (NORVASC) 10 MG tablet; Take 1 tablet (10 mg total) by mouth daily.  Dispense: 90 tablet; Refill: 1  3. Secondary hyperparathyroidism of renal origin (HCC) Routine lab ordered  - Ca+Creat+P+PTH Intact  4. Prediabetes Routine lab ordered  - Hgb A1C w/o eAG  5. Aortic atherosclerosis (HCC) Routine lab ordered  - Lipid Profile  6. Vitamin D  deficiency Routine lab ordered - Vitamin D (25 hydroxy)  7. Dysuria Routine urinalysis done  - UA/M w/rflx Culture, Routine - Microscopic Examination - Urine Culture, Reflex  8. Need for vaccination - Tdap (BOOSTRIX) 5-2.5-18.5 LF-MCG/0.5 injection; Inject 0.5 mLs into the muscle once for 1 dose.  Dispense: 0.5 mL; Refill: 0 - Zoster Vaccine Adjuvanted [facility]) injection; Inject 0.5 mLs into the muscle once for 1 dose.  Dispense: 0.5 mL; Refill: 0 - pneumococcal 20-valent conjugate vaccine (PREVNAR 20) 0.5 ML injection; Inject 0.5 mLs into the muscle tomorrow at 10 am for 1 dose.  Dispense: 0.5 mL; Refill: 0     General Counseling: Margrie verbalizes understanding of the findings of todays visit and agrees with plan of treatment. I have discussed any further diagnostic evaluation that may be needed or ordered today. We also reviewed her medications today.  she has been encouraged to call the office with any questions or concerns that should arise related to todays visit.    Orders Placed This Encounter  Procedures   Microscopic Examination   Urine Culture, Reflex   UA/M w/rflx Culture, Routine   Lipid Profile   Vitamin D (25 hydroxy)   Hgb A1C w/o eAG   Ca+Creat+P+PTH Intact    Meds ordered this encounter  Medications   Tdap (BOOSTRIX) 5-2.5-18.5 LF-MCG/0.5 injection    Sig: Inject 0.5 mLs into the muscle once for 1 dose.    Dispense:  0.5 mL    Refill:  0   Zoster Vaccine Adjuvanted [facility]) injection    Sig: Inject 0.5 mLs into the muscle once for 1 dose.    Dispense:  0.5 mL    Refill:  0   pneumococcal 20-valent conjugate vaccine (PREVNAR 20) 0.5 ML injection    Sig: Inject 0.5 mLs into the muscle tomorrow at 10 am for 1 dose.    Dispense:  0.5 mL    Refill:  0   amLODipine (NORVASC) 10 MG tablet    Sig: Take 1 tablet (10 mg total) by mouth daily.    Dispense:  90 tablet    Refill:  1    Return in about 4 weeks (around 04/08/2023) for F/U, eval new med, BP  check, Kacy Hegna PCP and discuss lab results. .   Total time spent:30 Minutes Time spent includes review of chart, medications, test results, and follow up plan with the patient.   [place] Controlled Substance Database was reviewed by me.  This patient was seen by Sallyanne Kuster, FNP-C in collaboration with Dr. Beverely Risen as a part of collaborative care agreement.  Tiffeny Minchew R. Tedd Sias, MSN, FNP-C Internal medicine

## 2023-03-17 DIAGNOSIS — R7303 Prediabetes: Secondary | ICD-10-CM | POA: Diagnosis not present

## 2023-03-17 DIAGNOSIS — N2581 Secondary hyperparathyroidism of renal origin: Secondary | ICD-10-CM | POA: Diagnosis not present

## 2023-03-17 DIAGNOSIS — Z0001 Encounter for general adult medical examination with abnormal findings: Secondary | ICD-10-CM | POA: Diagnosis not present

## 2023-03-17 DIAGNOSIS — E559 Vitamin D deficiency, unspecified: Secondary | ICD-10-CM | POA: Diagnosis not present

## 2023-03-17 DIAGNOSIS — I700 Atherosclerosis of aorta: Secondary | ICD-10-CM | POA: Diagnosis not present

## 2023-03-22 ENCOUNTER — Encounter: Payer: Self-pay | Admitting: Nurse Practitioner

## 2023-03-22 DIAGNOSIS — R7303 Prediabetes: Secondary | ICD-10-CM | POA: Insufficient documentation

## 2023-03-22 DIAGNOSIS — N2581 Secondary hyperparathyroidism of renal origin: Secondary | ICD-10-CM | POA: Insufficient documentation

## 2023-04-08 ENCOUNTER — Ambulatory Visit: Payer: 59 | Admitting: Nurse Practitioner

## 2023-04-21 ENCOUNTER — Encounter: Payer: Self-pay | Admitting: Nurse Practitioner

## 2023-04-21 ENCOUNTER — Ambulatory Visit (INDEPENDENT_AMBULATORY_CARE_PROVIDER_SITE_OTHER): Payer: 59 | Admitting: Nurse Practitioner

## 2023-04-21 VITALS — BP 135/88 | HR 98 | Temp 97.7°F | Resp 16 | Ht 65.0 in | Wt 246.2 lb

## 2023-04-21 DIAGNOSIS — N2581 Secondary hyperparathyroidism of renal origin: Secondary | ICD-10-CM | POA: Diagnosis not present

## 2023-04-21 DIAGNOSIS — I700 Atherosclerosis of aorta: Secondary | ICD-10-CM | POA: Diagnosis not present

## 2023-04-21 DIAGNOSIS — R7303 Prediabetes: Secondary | ICD-10-CM | POA: Diagnosis not present

## 2023-04-21 DIAGNOSIS — E559 Vitamin D deficiency, unspecified: Secondary | ICD-10-CM | POA: Diagnosis not present

## 2023-04-21 MED ORDER — VITAMIN D (ERGOCALCIFEROL) 1.25 MG (50000 UNIT) PO CAPS: 50000.0000 [IU] | ORAL_CAPSULE | ORAL | 1 refills | Status: DC

## 2023-04-21 NOTE — Progress Notes (Unsigned)
[facility] [address] [place], [place] [address]  Internal MEDICINE  Office Visit Note  Patient Name: Whitney [name]  [id number]  [id number]  Date of Service: 04/21/2023  Chief Complaint  Patient presents with   Depression   Hypertension   Follow-up    HPI [name] presents for a follow-up visit for  Hyperlipidemia --  Low vitamin D 9.6 Stable A1c 5.7 Secondary hyperparathyroidism --     Current Medication: Outpatient Encounter Medications as of 04/21/2023  Medication Sig   albuterol (VENTOLIN HFA) 108 (90 Base) MCG/ACT inhaler Inhale 2 puffs into the lungs every 4 (four) hours as needed for wheezing or shortness of breath.   amLODipine (NORVASC) 10 MG tablet Take 1 tablet (10 mg total) by mouth daily.   aspirin 81 MG chewable tablet Chew 1 tablet (81 mg total) by mouth 2 (two) times daily.   carvedilol (COREG) 12.5 MG tablet Take 1 tablet by mouth 2 (two) times daily.   diclofenac Sodium (VOLTAREN) 1 % GEL Apply 2 g topically daily as needed (Knee pain).   docusate sodium (COLACE) 100 MG capsule Take 1 capsule (100 mg total) by mouth 2 (two) times daily.   ezetimibe (ZETIA) 10 MG tablet Take 1 tablet (10 mg total) by mouth daily.   gabapentin (NEURONTIN) 100 MG capsule Take 200 mg by mouth 2 (two) times daily.   hydrALAZINE (APRESOLINE) 25 MG tablet Take 1 tablet (25 mg total) by mouth 3 (three) times daily. For HTN   magnesium oxide (MAG-OX) 400 MG tablet Take 1 tablet (400 mg total) by mouth daily.   methocarbamol (ROBAXIN) 500 MG tablet Take 1 tablet (500 mg total) by mouth every 8 (eight) hours as needed for muscle spasms.   orlistat (ALLI) 60 MG capsule Take 60 mg by mouth 3 (three) times daily with meals.   rOPINIRole (REQUIP) 0.5 MG tablet Take 1 tablet (0.5 mg total) by mouth at bedtime.   rosuvastatin (CRESTOR) 40 MG tablet Take 1 tablet (40 mg total) by mouth daily.   Vitamin D, Ergocalciferol, (DRISDOL) 1.25 MG (50000 UNIT) CAPS capsule Take 1  capsule (50,000 Units total) by mouth every 7 (seven) days.   [DISCONTINUED] Vitamin D, Ergocalciferol, (DRISDOL) 1.25 MG (50000 UNIT) CAPS capsule Take 1 capsule (50,000 Units total) by mouth every 7 (seven) days.   No facility-administered encounter medications on file as of 04/21/2023.    Surgical History: Past Surgical History:  Procedure Laterality Date   BIOPSY  10/31/2019   Procedure: BIOPSY;  Surgeon: Meridee Score Netty Starring., MD;  Location: [facility] ENDOSCOPY;  Service: Gastroenterology;;   CARDIAC CATHETERIZATION     CHOLECYSTECTOMY     COLONOSCOPY WITH PROPOFOL N/A 06/13/2019   Procedure: COLONOSCOPY WITH PROPOFOL;  Surgeon: Wyline Mood, MD;  Location: [facility] ENDOSCOPY;  Service: Gastroenterology;  Laterality: N/A;   COLONOSCOPY WITH PROPOFOL N/A 08/12/2019   Procedure: COLONOSCOPY WITH PROPOFOL;  Surgeon: Wyline Mood, MD;  Location: [facility] ENDOSCOPY;  Service: Gastroenterology;  Laterality: N/A;   COLONOSCOPY WITH PROPOFOL N/A 09/19/2019   Procedure: COLONOSCOPY WITH PROPOFOL;  Surgeon: Wyline Mood, MD;  Location: [facility] ENDOSCOPY;  Service: Gastroenterology;  Laterality: N/A;   COLONOSCOPY WITH PROPOFOL N/A 06/04/2021   Procedure: COLONOSCOPY WITH PROPOFOL;  Surgeon: Wyline Mood, MD;  Location: [facility] ENDOSCOPY;  Service: Gastroenterology;  Laterality: N/A;   DILATION AND CURETTAGE OF UTERUS     ESOPHAGOGASTRODUODENOSCOPY (EGD) WITH PROPOFOL N/A 06/13/2019   Procedure: ESOPHAGOGASTRODUODENOSCOPY (EGD) WITH PROPOFOL;  Surgeon: Wyline Mood, MD;  Location: [facility] ENDOSCOPY;  Service: Gastroenterology;  Laterality: N/A;  Pt will go for COVID test on 06-08-19 as she uses public transportation and will be in the area on that day.    ESOPHAGOGASTRODUODENOSCOPY (EGD) WITH PROPOFOL N/A 10/31/2019   Procedure: ESOPHAGOGASTRODUODENOSCOPY (EGD) WITH PROPOFOL;  Surgeon: Meridee Score Netty Starring., MD;  Location: [facility] ENDOSCOPY;  Service: Gastroenterology;  Laterality: N/A;   ESOPHAGOGASTRODUODENOSCOPY (EGD) WITH PROPOFOL  N/A 06/04/2021   Procedure: ESOPHAGOGASTRODUODENOSCOPY (EGD) WITH PROPOFOL;  Surgeon: Wyline Mood, MD;  Location: [facility] ENDOSCOPY;  Service: Gastroenterology;  Laterality: N/A;   HEMOSTASIS CLIP PLACEMENT  10/31/2019   Procedure: HEMOSTASIS CLIP PLACEMENT;  Surgeon: Lemar Lofty., MD;  Location: [facility] ENDOSCOPY;  Service: Gastroenterology;;   HERNIA REPAIR     HYSTEROSCOPY WITH D & C N/A 02/16/2017   Procedure: DILATATION AND CURETTAGE /HYSTEROSCOPY;  Surgeon: Herold Harms, MD;  Location: [facility] ORS;  Service: Gynecology;  Laterality: N/A;   JOINT REPLACEMENT Left    knee   kidney removal Left    KIDNEY SURGERY Left 1979   left kidney removed   LEFT HEART CATH AND CORONARY ANGIOGRAPHY N/A 10/21/2017   Procedure: LEFT HEART CATH AND CORONARY ANGIOGRAPHY;  Surgeon: Dalia Heading, MD;  Location: [facility] INVASIVE CV LAB;  Service: Cardiovascular;  Laterality: N/A;   POLYPECTOMY  10/31/2019   Procedure: POLYPECTOMY;  Surgeon: Meridee Score Netty Starring., MD;  Location: [facility] ENDOSCOPY;  Service: Gastroenterology;;   TOTAL KNEE ARTHROPLASTY Left 07/06/2017   Procedure: TOTAL KNEE ARTHROPLASTY;  Surgeon: Lyndle Herrlich, MD;  Location: [facility] ORS;  Service: Orthopedics;  Laterality: Left;   TOTAL KNEE ARTHROPLASTY Right 02/25/2021   Procedure: TOTAL KNEE ARTHROPLASTY;  Surgeon: Lyndle Herrlich, MD;  Location: [facility] ORS;  Service: Orthopedics;  Laterality: Right;   UPPER ESOPHAGEAL ENDOSCOPIC ULTRASOUND (EUS) N/A 10/31/2019   Procedure: UPPER ESOPHAGEAL ENDOSCOPIC ULTRASOUND (EUS);  Surgeon: Lemar Lofty., MD;  Location: [facility] ENDOSCOPY;  Service: Gastroenterology;  Laterality: N/A;   WRIST ARTHROSCOPY Left 1970s   XI ROBOTIC ASSISTED VENTRAL HERNIA N/A 12/01/2019   Procedure: XI ROBOTIC ASSISTED VENTRAL HERNIA;  Surgeon: Leafy Ro, MD;  Location: [facility] ORS;  Service: General;  Laterality: N/A;    Medical History: Past Medical History:  Diagnosis Date   Abnormal Pap smear of cervix    Pt  states she had colposcopy    Anxiety    Aortic atherosclerosis (HCC)    Arthritis    CAD (coronary artery disease)    Chronic kidney disease    s/p LEFT nephrectomy   Chronic lower back pain    COVID    Depression    Diverticulitis    Dyspnea    History of methicillin resistant staphylococcus aureus (MRSA) 12/21/2020   nasal swab pcr   Hx of unilateral nephrectomy 1979   Left Nephrectomy   Hypertension    Lower extremity edema    PAF (paroxysmal atrial fibrillation) (HCC)    Palpitations    PMB (postmenopausal bleeding)    Restless leg syndrome    Sleep apnea    has no cpap   Symptomatic bradycardia    Vitamin D deficiency     Family History: Family History  Problem Relation Age of Onset   Ovarian cancer Mother    Hypertension Father    Diabetes Father    Cancer Father    Breast cancer Sister    Diabetes Sister     Social History   Socioeconomic History   Marital status: Divorced    Spouse name: Not on  file   Number of children: Not on file   Years of education: Not on file   Highest education level: Not on file  Occupational History   Not on file  Tobacco Use   Smoking status: Never   Smokeless tobacco: Never  Vaping Use   Vaping status: Never Used  Substance and Sexual Activity   Alcohol use: Yes    Alcohol/week: 1.0 standard drink of alcohol    Types: 1 Standard drinks or equivalent per week    Comment: occasionally   Drug use: Yes    Frequency: 3.0 times per week    Types: Marijuana    Comment: 07/09/20 Pt states she uses for leg pain: marijuana; states no longer uses cocaine   Sexual activity: Yes    Birth control/protection: Post-menopausal  Other Topics Concern   Not on file  Social History Narrative   Lives with cousin.   Social Determinants of Health   Financial Resource Strain: Low Risk  (03/22/2021)   Overall Financial Resource Strain (CARDIA)    Difficulty of Paying Living Expenses: Not very hard  Food Insecurity: Low Risk   (10/19/2022)   [facility]   Hunger Vital Sign    Worried About Running Out of Food in the Last Year: Never true    Ran Out of Food in the Last Year: Never true  Transportation Needs: No Transportation Needs (10/19/2022)   [facility]   Transportation    In the past 12 months, has lack of reliable transportation kept you from medical appointments, meetings, work or from getting things needed for daily living? : No  Physical Activity: Not on file  Stress: Not on file  Social Connections: Not on file  Intimate Partner Violence: Not on file      Review of Systems  Vital Signs: BP (!) 150/96   Pulse 98   Temp 97.7 F (36.5 C)   Resp 16   Ht 5\' 5"  (1.651 m)   Wt 246 lb 3.2 oz (111.7 kg)   LMP 01/26/2017 Comment: age 72  SpO2 99%   BMI 40.97 kg/m    Physical Exam     Assessment/Plan:   General Counseling: Tinsley verbalizes understanding of the findings of todays visit and agrees with plan of treatment. I have discussed any further diagnostic evaluation that may be needed or ordered today. We also reviewed her medications today. she has been encouraged to call the office with any questions or concerns that should arise related to todays visit.    No orders of the defined types were placed in this encounter.   Meds ordered this encounter  Medications   Vitamin D, Ergocalciferol, (DRISDOL) 1.25 MG (50000 UNIT) CAPS capsule    Sig: Take 1 capsule (50,000 Units total) by mouth every 7 (seven) days.    Dispense:  12 capsule    Refill:  1    Return in about 6 months (around 10/19/2023) for F/U, Delonda Coley PCP.   Total time spent:*** Minutes Time spent includes review of chart, medications, test results, and follow up plan with the patient.   [place] Controlled Substance Database was reviewed by me.  This patient was seen by Sallyanne Kuster, FNP-C in collaboration with Dr. Beverely Risen as a part of collaborative care  agreement.   Edinson Domeier R. Tedd Sias, MSN, FNP-C Internal medicine

## 2023-06-15 ENCOUNTER — Telehealth: Payer: Self-pay | Admitting: Internal Medicine

## 2023-06-15 NOTE — Telephone Encounter (Signed)
Lvm to schedule lab review follow up w/ dfk-Toni

## 2023-06-16 ENCOUNTER — Other Ambulatory Visit: Payer: Self-pay | Admitting: Nurse Practitioner

## 2023-06-16 ENCOUNTER — Telehealth: Payer: Self-pay | Admitting: Internal Medicine

## 2023-06-16 ENCOUNTER — Other Ambulatory Visit: Payer: Self-pay | Admitting: Internal Medicine

## 2023-06-16 ENCOUNTER — Encounter: Payer: Self-pay | Admitting: Nurse Practitioner

## 2023-06-16 DIAGNOSIS — Z760 Encounter for issue of repeat prescription: Secondary | ICD-10-CM

## 2023-06-16 NOTE — Telephone Encounter (Signed)
Left another vm and sent message to schedule appt with dfk-Toni

## 2023-07-06 ENCOUNTER — Ambulatory Visit (INDEPENDENT_AMBULATORY_CARE_PROVIDER_SITE_OTHER): Payer: 59 | Admitting: Internal Medicine

## 2023-07-06 ENCOUNTER — Encounter: Payer: Self-pay | Admitting: Internal Medicine

## 2023-07-06 VITALS — BP 123/90 | HR 98 | Temp 97.7°F | Resp 16 | Ht 65.0 in | Wt 241.4 lb

## 2023-07-06 DIAGNOSIS — K219 Gastro-esophageal reflux disease without esophagitis: Secondary | ICD-10-CM

## 2023-07-06 DIAGNOSIS — I10 Essential (primary) hypertension: Secondary | ICD-10-CM | POA: Diagnosis not present

## 2023-07-06 DIAGNOSIS — R809 Proteinuria, unspecified: Secondary | ICD-10-CM | POA: Diagnosis not present

## 2023-07-06 DIAGNOSIS — I129 Hypertensive chronic kidney disease with stage 1 through stage 4 chronic kidney disease, or unspecified chronic kidney disease: Secondary | ICD-10-CM

## 2023-07-06 DIAGNOSIS — E559 Vitamin D deficiency, unspecified: Secondary | ICD-10-CM

## 2023-07-06 DIAGNOSIS — N1832 Chronic kidney disease, stage 3b: Secondary | ICD-10-CM | POA: Diagnosis not present

## 2023-07-06 DIAGNOSIS — N2581 Secondary hyperparathyroidism of renal origin: Secondary | ICD-10-CM | POA: Diagnosis not present

## 2023-07-06 MED ORDER — VITAMIN D (ERGOCALCIFEROL) 1.25 MG (50000 UNIT) PO CAPS: 50000.0000 [IU] | ORAL_CAPSULE | ORAL | 4 refills | Status: DC

## 2023-07-06 MED ORDER — FAMOTIDINE 20 MG PO TABS: ORAL_TABLET | ORAL | 6 refills | Status: DC

## 2023-07-06 NOTE — Progress Notes (Unsigned)
[facility] [address] [place], [place] [address]  Internal MEDICINE  Office Visit Note  Patient Name: Whitney [name]  [id number]  [id number]  Date of Service: 07/07/2023  Chief Complaint  Patient presents with   Labs Only    Follow up    HPI Pt is seen for abnormal labs follow up Pt has stopped taking her hydralazine, elevated creatinine However she has been back on all antihypertensives  Has appointment with nephrology  Brought all medications with her  C/o reflux  Will like to have a refill on her Vit D     Current Medication: Outpatient Encounter Medications as of 07/06/2023  Medication Sig   albuterol (VENTOLIN HFA) 108 (90 Base) MCG/ACT inhaler Inhale 2 puffs into the lungs every 4 (four) hours as needed for wheezing or shortness of breath.   amLODipine (NORVASC) 10 MG tablet Take 1 tablet (10 mg total) by mouth daily.   aspirin 81 MG chewable tablet Chew 1 tablet (81 mg total) by mouth 2 (two) times daily.   carvedilol (COREG) 12.5 MG tablet Take 1 tablet by mouth 2 (two) times daily.   diclofenac Sodium (VOLTAREN) 1 % GEL Apply 2 g topically daily as needed (Knee pain).   docusate sodium (COLACE) 100 MG capsule Take 1 capsule (100 mg total) by mouth 2 (two) times daily.   famotidine (PEPCID) 20 MG tablet Take one tab po bid before meals   gabapentin (NEURONTIN) 100 MG capsule Take 200 mg by mouth 2 (two) times daily.   hydrALAZINE (APRESOLINE) 25 MG tablet TAKE 1 TABLET(25 MG) BY MOUTH THREE TIMES DAILY FOR HYPERTENSION   magnesium oxide (MAG-OX) 400 MG tablet Take 1 tablet (400 mg total) by mouth daily.   methocarbamol (ROBAXIN) 500 MG tablet Take 1 tablet (500 mg total) by mouth every 8 (eight) hours as needed for muscle spasms.   orlistat (ALLI) 60 MG capsule Take 60 mg by mouth 3 (three) times daily with meals.   rOPINIRole (REQUIP) 0.5 MG tablet Take 1 tablet (0.5 mg total) by mouth at bedtime.   rosuvastatin (CRESTOR) 40 MG tablet Take 1 tablet  (40 mg total) by mouth daily.   [DISCONTINUED] ezetimibe (ZETIA) 10 MG tablet Take 1 tablet (10 mg total) by mouth daily.   [DISCONTINUED] Vitamin D, Ergocalciferol, (DRISDOL) 1.25 MG (50000 UNIT) CAPS capsule Take 1 capsule (50,000 Units total) by mouth every 7 (seven) days.   Vitamin D, Ergocalciferol, (DRISDOL) 1.25 MG (50000 UNIT) CAPS capsule Take 1 capsule (50,000 Units total) by mouth every 7 (seven) days.   No facility-administered encounter medications on file as of 07/06/2023.    Surgical History: Past Surgical History:  Procedure Laterality Date   BIOPSY  10/31/2019   Procedure: BIOPSY;  Surgeon: Meridee Score Netty Starring., MD;  Location: [facility] ENDOSCOPY;  Service: Gastroenterology;;   CARDIAC CATHETERIZATION     CHOLECYSTECTOMY     COLONOSCOPY WITH PROPOFOL N/A 06/13/2019   Procedure: COLONOSCOPY WITH PROPOFOL;  Surgeon: Wyline Mood, MD;  Location: [facility] ENDOSCOPY;  Service: Gastroenterology;  Laterality: N/A;   COLONOSCOPY WITH PROPOFOL N/A 08/12/2019   Procedure: COLONOSCOPY WITH PROPOFOL;  Surgeon: Wyline Mood, MD;  Location: [facility] ENDOSCOPY;  Service: Gastroenterology;  Laterality: N/A;   COLONOSCOPY WITH PROPOFOL N/A 09/19/2019   Procedure: COLONOSCOPY WITH PROPOFOL;  Surgeon: Wyline Mood, MD;  Location: [facility] ENDOSCOPY;  Service: Gastroenterology;  Laterality: N/A;   COLONOSCOPY WITH PROPOFOL N/A 06/04/2021   Procedure: COLONOSCOPY WITH PROPOFOL;  Surgeon: Wyline Mood, MD;  Location: [facility]  ENDOSCOPY;  Service: Gastroenterology;  Laterality: N/A;   DILATION AND CURETTAGE OF UTERUS     ESOPHAGOGASTRODUODENOSCOPY (EGD) WITH PROPOFOL N/A 06/13/2019   Procedure: ESOPHAGOGASTRODUODENOSCOPY (EGD) WITH PROPOFOL;  Surgeon: Wyline Mood, MD;  Location: [facility] ENDOSCOPY;  Service: Gastroenterology;  Laterality: N/A;  Pt will go for COVID test on 06-08-19 as she uses public transportation and will be in the area on that day.    ESOPHAGOGASTRODUODENOSCOPY (EGD) WITH PROPOFOL N/A 10/31/2019   Procedure:  ESOPHAGOGASTRODUODENOSCOPY (EGD) WITH PROPOFOL;  Surgeon: Meridee Score Netty Starring., MD;  Location: [facility] ENDOSCOPY;  Service: Gastroenterology;  Laterality: N/A;   ESOPHAGOGASTRODUODENOSCOPY (EGD) WITH PROPOFOL N/A 06/04/2021   Procedure: ESOPHAGOGASTRODUODENOSCOPY (EGD) WITH PROPOFOL;  Surgeon: Wyline Mood, MD;  Location: [facility] ENDOSCOPY;  Service: Gastroenterology;  Laterality: N/A;   HEMOSTASIS CLIP PLACEMENT  10/31/2019   Procedure: HEMOSTASIS CLIP PLACEMENT;  Surgeon: Lemar Lofty., MD;  Location: [facility] ENDOSCOPY;  Service: Gastroenterology;;   HERNIA REPAIR     HYSTEROSCOPY WITH D & C N/A 02/16/2017   Procedure: DILATATION AND CURETTAGE /HYSTEROSCOPY;  Surgeon: Herold Harms, MD;  Location: [facility] ORS;  Service: Gynecology;  Laterality: N/A;   JOINT REPLACEMENT Left    knee   kidney removal Left    KIDNEY SURGERY Left 1979   left kidney removed   LEFT HEART CATH AND CORONARY ANGIOGRAPHY N/A 10/21/2017   Procedure: LEFT HEART CATH AND CORONARY ANGIOGRAPHY;  Surgeon: Dalia Heading, MD;  Location: [facility] INVASIVE CV LAB;  Service: Cardiovascular;  Laterality: N/A;   POLYPECTOMY  10/31/2019   Procedure: POLYPECTOMY;  Surgeon: Meridee Score Netty Starring., MD;  Location: [facility] ENDOSCOPY;  Service: Gastroenterology;;   TOTAL KNEE ARTHROPLASTY Left 07/06/2017   Procedure: TOTAL KNEE ARTHROPLASTY;  Surgeon: Lyndle Herrlich, MD;  Location: [facility] ORS;  Service: Orthopedics;  Laterality: Left;   TOTAL KNEE ARTHROPLASTY Right 02/25/2021   Procedure: TOTAL KNEE ARTHROPLASTY;  Surgeon: Lyndle Herrlich, MD;  Location: [facility] ORS;  Service: Orthopedics;  Laterality: Right;   UPPER ESOPHAGEAL ENDOSCOPIC ULTRASOUND (EUS) N/A 10/31/2019   Procedure: UPPER ESOPHAGEAL ENDOSCOPIC ULTRASOUND (EUS);  Surgeon: Lemar Lofty., MD;  Location: [facility] ENDOSCOPY;  Service: Gastroenterology;  Laterality: N/A;   WRIST ARTHROSCOPY Left 1970s   XI ROBOTIC ASSISTED VENTRAL HERNIA N/A 12/01/2019   Procedure: XI ROBOTIC ASSISTED  VENTRAL HERNIA;  Surgeon: Leafy Ro, MD;  Location: [facility] ORS;  Service: General;  Laterality: N/A;    Medical History: Past Medical History:  Diagnosis Date   Abnormal Pap smear of cervix    Pt states she had colposcopy    Anxiety    Aortic atherosclerosis (HCC)    Arthritis    CAD (coronary artery disease)    Chronic kidney disease    s/p LEFT nephrectomy   Chronic lower back pain    COVID    Depression    Diverticulitis    Dyspnea    History of methicillin resistant staphylococcus aureus (MRSA) 12/21/2020   nasal swab pcr   Hx of unilateral nephrectomy 1979   Left Nephrectomy   Hypertension    Lower extremity edema    PAF (paroxysmal atrial fibrillation) (HCC)    Palpitations    PMB (postmenopausal bleeding)    Restless leg syndrome    Sleep apnea    has no cpap   Symptomatic bradycardia    Vitamin D deficiency     Family History: Family History  Problem Relation Age of Onset   Ovarian cancer Mother    Hypertension Father  Diabetes Father    Cancer Father    Breast cancer Sister    Diabetes Sister     Social History   Socioeconomic History   Marital status: Divorced    Spouse name: Not on file   Number of children: Not on file   Years of education: Not on file   Highest education level: Not on file  Occupational History   Not on file  Tobacco Use   Smoking status: Never   Smokeless tobacco: Never  Vaping Use   Vaping status: Never Used  Substance and Sexual Activity   Alcohol use: Yes    Alcohol/week: 1.0 standard drink of alcohol    Types: 1 Standard drinks or equivalent per week    Comment: occasionally   Drug use: Yes    Frequency: 3.0 times per week    Types: Marijuana    Comment: 07/06/23: Pt states she uses for leg pain: marijuana; states no longer uses cocaine   Sexual activity: Yes    Birth control/protection: Post-menopausal  Other Topics Concern   Not on file  Social History Narrative   Lives with cousin.   Social  Determinants of Health   Financial Resource Strain: Low Risk  (03/22/2021)   Overall Financial Resource Strain (CARDIA)    Difficulty of Paying Living Expenses: Not very hard  Food Insecurity: Low Risk  (10/19/2022)   [facility]   Hunger Vital Sign    Worried About Running Out of Food in the Last Year: Never true    Ran Out of Food in the Last Year: Never true  Transportation Needs: No Transportation Needs (10/19/2022)   [facility]   Transportation    In the past 12 months, has lack of reliable transportation kept you from medical appointments, meetings, work or from getting things needed for daily living? : No  Physical Activity: Not on file  Stress: Not on file  Social Connections: Not on file  Intimate Partner Violence: Not on file      Review of Systems  Constitutional:  Negative for fatigue and fever.  HENT:  Negative for congestion, mouth sores and postnasal drip.   Respiratory:  Negative for cough.   Cardiovascular:  Negative for chest pain.  Gastrointestinal:        Heartburn   Genitourinary:  Negative for flank pain.  Psychiatric/Behavioral: Negative.      Vital Signs: BP (!) 123/90   Pulse 98   Temp 97.7 F (36.5 C)   Resp 16   Ht 5\' 5"  (1.651 m)   Wt 241 lb 6.4 oz (109.5 kg)   LMP 01/26/2017 Comment: age 72  SpO2 94%   BMI 40.17 kg/m    Physical Exam Constitutional:      Appearance: Normal appearance.  HENT:     Head: Normocephalic and atraumatic.     Nose: Nose normal.     Mouth/Throat:     Mouth: Mucous membranes are moist.     Pharynx: No posterior oropharyngeal erythema.  Eyes:     Extraocular Movements: Extraocular movements intact.     Pupils: Pupils are equal, round, and reactive to light.  Cardiovascular:     Pulses: Normal pulses.     Heart sounds: Normal heart sounds.  Pulmonary:     Effort: Pulmonary effort is normal.     Breath sounds: Normal breath sounds.   Neurological:     General: No focal deficit present.  Mental Status: She is alert.  Psychiatric:        Mood and Affect: Mood normal.        Behavior: Behavior normal.        Assessment/Plan: 1. Hypertensive kidney disease with stage 3b chronic kidney disease (HCC) She has a follow up with nephrology, encouraged to be adherent to her medications, coreg, hydralazine and norvasc, will get benefit from ARB but she is allergic to ace inh   2. Vitamin D deficiency Refills provided per pt request  - Vitamin D, Ergocalciferol, (DRISDOL) 1.25 MG (50000 UNIT) CAPS capsule; Take 1 capsule (50,000 Units total) by mouth every 7 (seven) days.  Dispense: 12 capsule; Refill: 4  3. Gastroesophageal reflux disease without esophagitis Start pepcid, might need UGI or EGD depending on her symptoms  - famotidine (PEPCID) 20 MG tablet; Take one tab po bid before meals  Dispense: 60 tablet; Refill: 6   General Counseling: Lillye verbalizes understanding of the findings of todays visit and agrees with plan of treatment. I have discussed any further diagnostic evaluation that may be needed or ordered today. We also reviewed her medications today. she has been encouraged to call the office with any questions or concerns that should arise related to todays visit.    No orders of the defined types were placed in this encounter.   Meds ordered this encounter  Medications   famotidine (PEPCID) 20 MG tablet    Sig: Take one tab po bid before meals    Dispense:  60 tablet    Refill:  6   Vitamin D, Ergocalciferol, (DRISDOL) 1.25 MG (50000 UNIT) CAPS capsule    Sig: Take 1 capsule (50,000 Units total) by mouth every 7 (seven) days.    Dispense:  12 capsule    Refill:  4    Total time spent:30 Minutes Time spent includes review of chart, medications, test results, and follow up plan with the patient.   [place] Controlled Substance Database was reviewed by me.   Dr Lyndon Code Internal medicine

## 2023-07-07 ENCOUNTER — Encounter: Payer: Self-pay | Admitting: Internal Medicine

## 2023-07-21 DIAGNOSIS — N1832 Chronic kidney disease, stage 3b: Secondary | ICD-10-CM | POA: Diagnosis not present

## 2023-07-21 DIAGNOSIS — R809 Proteinuria, unspecified: Secondary | ICD-10-CM | POA: Diagnosis not present

## 2023-07-27 ENCOUNTER — Other Ambulatory Visit: Payer: Self-pay | Admitting: Nurse Practitioner

## 2023-07-27 DIAGNOSIS — G2581 Restless legs syndrome: Secondary | ICD-10-CM

## 2023-09-24 ENCOUNTER — Other Ambulatory Visit: Payer: Self-pay | Admitting: Nurse Practitioner

## 2023-09-24 DIAGNOSIS — I10 Essential (primary) hypertension: Secondary | ICD-10-CM

## 2023-10-13 ENCOUNTER — Encounter: Payer: Self-pay | Admitting: Nurse Practitioner

## 2023-10-13 ENCOUNTER — Other Ambulatory Visit: Payer: Self-pay | Admitting: Nurse Practitioner

## 2023-10-13 ENCOUNTER — Ambulatory Visit (INDEPENDENT_AMBULATORY_CARE_PROVIDER_SITE_OTHER): Payer: 59 | Admitting: Nurse Practitioner

## 2023-10-13 VITALS — BP 160/80 | HR 85 | Temp 98.3°F | Resp 16 | Ht 65.0 in | Wt 237.4 lb

## 2023-10-13 DIAGNOSIS — E559 Vitamin D deficiency, unspecified: Secondary | ICD-10-CM | POA: Diagnosis not present

## 2023-10-13 DIAGNOSIS — E538 Deficiency of other specified B group vitamins: Secondary | ICD-10-CM

## 2023-10-13 DIAGNOSIS — R7989 Other specified abnormal findings of blood chemistry: Secondary | ICD-10-CM

## 2023-10-13 DIAGNOSIS — R7303 Prediabetes: Secondary | ICD-10-CM

## 2023-10-13 DIAGNOSIS — I700 Atherosclerosis of aorta: Secondary | ICD-10-CM | POA: Diagnosis not present

## 2023-10-13 DIAGNOSIS — Z760 Encounter for issue of repeat prescription: Secondary | ICD-10-CM

## 2023-10-13 DIAGNOSIS — I10 Essential (primary) hypertension: Secondary | ICD-10-CM | POA: Diagnosis not present

## 2023-10-13 DIAGNOSIS — G2581 Restless legs syndrome: Secondary | ICD-10-CM

## 2023-10-13 DIAGNOSIS — Z1231 Encounter for screening mammogram for malignant neoplasm of breast: Secondary | ICD-10-CM

## 2023-10-13 MED ORDER — HYDRALAZINE HCL 25 MG PO TABS: 25.0000 mg | ORAL_TABLET | Freq: Three times a day (TID) | ORAL | 1 refills | Status: DC

## 2023-10-13 MED ORDER — ROSUVASTATIN CALCIUM 40 MG PO TABS: 40.0000 mg | ORAL_TABLET | Freq: Every day | ORAL | 3 refills | Status: DC

## 2023-10-13 MED ORDER — ROPINIROLE HCL 0.5 MG PO TABS: 0.5000 mg | ORAL_TABLET | Freq: Every day | ORAL | 3 refills | Status: DC
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
## 2023-10-19 ENCOUNTER — Ambulatory Visit: Payer: 59 | Admitting: Nurse Practitioner

## 2023-11-26 ENCOUNTER — Other Ambulatory Visit: Payer: Self-pay | Admitting: Nurse Practitioner

## 2023-11-26 DIAGNOSIS — I10 Essential (primary) hypertension: Secondary | ICD-10-CM

## 2023-12-04 DIAGNOSIS — M7542 Impingement syndrome of left shoulder: Secondary | ICD-10-CM | POA: Diagnosis not present

## 2023-12-04 DIAGNOSIS — M7062 Trochanteric bursitis, left hip: Secondary | ICD-10-CM | POA: Diagnosis not present

## 2023-12-16 DIAGNOSIS — N184 Chronic kidney disease, stage 4 (severe): Secondary | ICD-10-CM | POA: Diagnosis not present

## 2023-12-16 DIAGNOSIS — I10 Essential (primary) hypertension: Secondary | ICD-10-CM | POA: Diagnosis not present

## 2023-12-16 DIAGNOSIS — R809 Proteinuria, unspecified: Secondary | ICD-10-CM | POA: Diagnosis not present

## 2023-12-16 DIAGNOSIS — N2581 Secondary hyperparathyroidism of renal origin: Secondary | ICD-10-CM | POA: Diagnosis not present

## 2024-01-12 ENCOUNTER — Ambulatory Visit
Admission: RE | Admit: 2024-01-12 | Discharge: 2024-01-12 | Disposition: A | Discharge status: Home / Self Care | Source: Ambulatory Visit | Attending: Nurse Practitioner | Admitting: Nurse Practitioner

## 2024-01-12 DIAGNOSIS — Z1231 Encounter for screening mammogram for malignant neoplasm of breast: Secondary | ICD-10-CM | POA: Diagnosis not present

## 2024-01-14 DIAGNOSIS — R7989 Other specified abnormal findings of blood chemistry: Secondary | ICD-10-CM | POA: Diagnosis not present

## 2024-01-14 DIAGNOSIS — I700 Atherosclerosis of aorta: Secondary | ICD-10-CM | POA: Diagnosis not present

## 2024-01-14 DIAGNOSIS — E559 Vitamin D deficiency, unspecified: Secondary | ICD-10-CM | POA: Diagnosis not present

## 2024-01-14 DIAGNOSIS — R7303 Prediabetes: Secondary | ICD-10-CM | POA: Diagnosis not present

## 2024-01-14 DIAGNOSIS — E782 Mixed hyperlipidemia: Secondary | ICD-10-CM | POA: Diagnosis not present

## 2024-01-14 DIAGNOSIS — E538 Deficiency of other specified B group vitamins: Secondary | ICD-10-CM | POA: Diagnosis not present

## 2024-01-16 LAB — MAGNESIUM: Magnesium: 2.2 mg/dL (ref 1.6–2.3)

## 2024-01-16 LAB — CMP14+EGFR
ALT: 12 IU/L (ref 0–32)
AST: 16 IU/L (ref 0–40)
Albumin: 3.6 g/dL — ABNORMAL LOW (ref 3.8–4.8)
Alkaline Phosphatase: 98 IU/L (ref 44–121)
BUN/Creatinine Ratio: 14 (ref 12–28)
BUN: 32 mg/dL — ABNORMAL HIGH (ref 8–27)
Bilirubin Total: 0.3 mg/dL (ref 0.0–1.2)
CO2: 22 mmol/L (ref 20–29)
Calcium: 8.7 mg/dL (ref 8.7–10.3)
Chloride: 108 mmol/L — ABNORMAL HIGH (ref 96–106)
Creatinine, Ser: 2.27 mg/dL — ABNORMAL HIGH (ref 0.57–1.00)
Globulin, Total: 2.7 g/dL (ref 1.5–4.5)
Glucose: 81 mg/dL (ref 70–99)
Potassium: 5.5 mmol/L — ABNORMAL HIGH (ref 3.5–5.2)
Sodium: 143 mmol/L (ref 134–144)
Total Protein: 6.3 g/dL (ref 6.0–8.5)
eGFR: 22 mL/min/{1.73_m2} — ABNORMAL LOW (ref >59)

## 2024-01-16 LAB — CBC WITH DIFFERENTIAL/PLATELET
Basophils Absolute: 0 10*3/uL (ref 0.0–0.2)
Basos: 0 %
EOS (ABSOLUTE): 0.1 10*3/uL (ref 0.0–0.4)
Eos: 2 %
Hematocrit: 39.8 % (ref 34.0–46.6)
Hemoglobin: 12.9 g/dL (ref 11.1–15.9)
Immature Grans (Abs): 0 10*3/uL (ref 0.0–0.1)
Immature Granulocytes: 0 %
Lymphocytes Absolute: 1.8 10*3/uL (ref 0.7–3.1)
Lymphs: 29 %
MCH: 30.1 pg (ref 26.6–33.0)
MCHC: 32.4 g/dL (ref 31.5–35.7)
MCV: 93 fL (ref 79–97)
Monocytes Absolute: 0.6 10*3/uL (ref 0.1–0.9)
Monocytes: 10 %
Neutrophils Absolute: 3.7 10*3/uL (ref 1.4–7.0)
Neutrophils: 58 %
Platelets: 261 10*3/uL (ref 150–450)
RBC: 4.29 x10E6/uL (ref 3.77–5.28)
RDW: 13 % (ref 11.7–15.4)
WBC: 6.3 10*3/uL (ref 3.4–10.8)

## 2024-01-16 LAB — LIPID PANEL
Chol/HDL Ratio: 5.7 ratio — ABNORMAL HIGH (ref 0.0–4.4)
Cholesterol, Total: 400 mg/dL — ABNORMAL HIGH (ref 100–199)
HDL: 70 mg/dL (ref >39)
LDL Chol Calc (NIH): 299 mg/dL — ABNORMAL HIGH (ref 0–99)
Triglycerides: 159 mg/dL — ABNORMAL HIGH (ref 0–149)
VLDL Cholesterol Cal: 31 mg/dL (ref 5–40)

## 2024-01-16 LAB — B12 AND FOLATE PANEL
Folate: 7.7 ng/mL (ref >3.0)
Vitamin B-12: 585 pg/mL (ref 232–1245)

## 2024-01-16 LAB — PHOSPHORUS: Phosphorus: 4 mg/dL (ref 3.0–4.3)

## 2024-01-16 LAB — HGB A1C W/O EAG: Hgb A1c MFr Bld: 5.6 % (ref 4.8–5.6)

## 2024-01-16 LAB — PARATHYROID HORMONE, INTACT (NO CA): PTH: 100 pg/mL — ABNORMAL HIGH (ref 15–65)

## 2024-01-16 LAB — VITAMIN D 25 HYDROXY (VIT D DEFICIENCY, FRACTURES): Vit D, 25-Hydroxy: 12.1 ng/mL — ABNORMAL LOW (ref 30.0–100.0)

## 2024-01-28 ENCOUNTER — Other Ambulatory Visit: Payer: Self-pay | Admitting: Nurse Practitioner

## 2024-01-28 DIAGNOSIS — R252 Cramp and spasm: Secondary | ICD-10-CM

## 2024-01-28 DIAGNOSIS — M47816 Spondylosis without myelopathy or radiculopathy, lumbar region: Secondary | ICD-10-CM

## 2024-01-28 NOTE — Telephone Encounter (Signed)
 Please review

## 2024-01-29 ENCOUNTER — Ambulatory Visit: Payer: Self-pay | Admitting: Nurse Practitioner

## 2024-02-03 DIAGNOSIS — R2689 Other abnormalities of gait and mobility: Secondary | ICD-10-CM | POA: Diagnosis not present

## 2024-02-03 DIAGNOSIS — Z79899 Other long term (current) drug therapy: Secondary | ICD-10-CM | POA: Diagnosis not present

## 2024-02-03 DIAGNOSIS — R1032 Left lower quadrant pain: Secondary | ICD-10-CM | POA: Diagnosis not present

## 2024-02-03 DIAGNOSIS — N179 Acute kidney failure, unspecified: Secondary | ICD-10-CM | POA: Diagnosis not present

## 2024-02-03 DIAGNOSIS — K5792 Diverticulitis of intestine, part unspecified, without perforation or abscess without bleeding: Secondary | ICD-10-CM | POA: Diagnosis not present

## 2024-02-03 DIAGNOSIS — G2581 Restless legs syndrome: Secondary | ICD-10-CM | POA: Diagnosis not present

## 2024-02-03 DIAGNOSIS — I10 Essential (primary) hypertension: Secondary | ICD-10-CM | POA: Diagnosis not present

## 2024-02-03 DIAGNOSIS — K5732 Diverticulitis of large intestine without perforation or abscess without bleeding: Secondary | ICD-10-CM | POA: Diagnosis not present

## 2024-02-04 DIAGNOSIS — K5792 Diverticulitis of intestine, part unspecified, without perforation or abscess without bleeding: Secondary | ICD-10-CM | POA: Diagnosis not present

## 2024-02-04 DIAGNOSIS — R002 Palpitations: Secondary | ICD-10-CM | POA: Diagnosis not present

## 2024-02-10 ENCOUNTER — Telehealth: Payer: Self-pay | Admitting: Nurse Practitioner

## 2024-02-10 NOTE — Telephone Encounter (Signed)
 Contacted patient to schedule hospital f/u for Diverticulitis. She stated she has appointment with GI. She will just come for appointment in August-Toni

## 2024-03-11 ENCOUNTER — Telehealth: Payer: Self-pay

## 2024-03-11 NOTE — Progress Notes (Signed)
   03/11/2024  Patient ID: Whitney [name], female   DOB: Feb 08, 1951, 73 y.o.   MRN: [id number]  This patient is appearing on a report for being at risk of failing the adherence measure for identified medications this calendar year.   Medication Adherence Summary (STAR/HEDIS Monitoring): Adherence Category: cholesterol (statin)    Drug Name: Rosuvastatin   Last Fill or Sold Date:3/19/205 Days' Supply: 90 - Per chart review, patient is supposed to be on medication.      Notes: ? Adherence data pulled from pharmacy claims portal Dr. Annemarie. I called patient but was unable to reach. Left a message asking to return my call.  ? Reviewed barriers to adherence: unknown. ? Plan: MyChart message sent to patient.  Dorcas Solian, PharmD Clinical Pharmacist Cell: [phone]

## 2024-03-16 ENCOUNTER — Ambulatory Visit: Payer: 59 | Admitting: Nurse Practitioner

## 2024-03-16 ENCOUNTER — Ambulatory Visit: Admitting: Surgery

## 2024-03-17 NOTE — Progress Notes (Addendum)
   03/17/2024  Patient ID: Whitney [name] Seats, female   DOB: 03-31-1951, 73 y.o.   MRN: [id number]  This patient is appearing on a report for being at risk of failing the adherence measure for identified medications this calendar year.   Medication Adherence Summary (STAR/HEDIS Monitoring): Adherence Category: cholesterol (statin)    Drug Name: Rosuvastatin   Last Fill or Sold Date:3/19/205 Days' Supply: 90      Notes: ? Adherence data pulled from pharmacy claims portal Dr. Annemarie. I called and spoke with patient today.  - Patient has not picked up medication yet as of today. Stated that she sometimes take rosuvastatin  every 3 days because that medication makes her sick to my stomach. Additionally, she states that she has rosuvastatin  in various strengths and uses old medications. Discussed reviewing expired dates on bottles and taking the right strength. Overall, encouraged her not to use old medications of rosuvastatin . She states that she checks the expiration dates. She also stated Dr. Fernand is aware of side effect. ? Reviewed barriers to adherence: side effects. LDL 299  as of 01/14/2024. Upcoming PCP appointment on 04/12/2024. ? Plan: Will collaborate with PCP regarding compliance.   Dorcas Solian, PharmD Clinical Pharmacist Cell: [phone]

## 2024-04-05 ENCOUNTER — Telehealth: Payer: Self-pay

## 2024-04-05 NOTE — Progress Notes (Signed)
   04/05/2024  Patient ID: Whitney [name] Seats, female   DOB: [birth year], 73 y.o.   MRN: [id number]  Medication Adherence Update:  Rosuvastatin  remains on active medication list with an upcoming appointment on 06/27/2024 with PCP. Will follow up again for possible exclusion code opportunity prior to appointment. Per Dr. Annemarie, rosuvastatin  still has not been refilled.   Dorcas Solian, PharmD Clinical Pharmacist Cell: [phone]

## 2024-04-11 ENCOUNTER — Telehealth: Payer: Self-pay

## 2024-04-11 ENCOUNTER — Ambulatory Visit: Admitting: Surgery

## 2024-04-11 NOTE — Progress Notes (Signed)
   04/11/2024  Patient ID: Whitney [name], female   DOB: 07/21/1951, 73 y.o.   MRN: [id number]  Medication Adherence Update:  Rosuvastatin  remains on active medication list with an upcoming appointment on 06/27/2024 with PCP. Per Dr. Annemarie, rosuvastatin  still has not been refilled. Will follow up again for possible exclusion code opportunity prior to appointment.   Dorcas Solian, PharmD Clinical Pharmacist Cell: [phone]

## 2024-04-12 ENCOUNTER — Ambulatory Visit: Admitting: Nurse Practitioner

## 2024-06-03 ENCOUNTER — Other Ambulatory Visit: Payer: Self-pay

## 2024-06-03 DIAGNOSIS — G2581 Restless legs syndrome: Secondary | ICD-10-CM

## 2024-06-03 MED ORDER — ROPINIROLE HCL 0.5 MG PO TABS: 0.5000 mg | ORAL_TABLET | Freq: Every day | ORAL | 0 refills | Status: DC

## 2024-06-20 ENCOUNTER — Other Ambulatory Visit: Payer: Self-pay

## 2024-06-20 DIAGNOSIS — G2581 Restless legs syndrome: Secondary | ICD-10-CM

## 2024-06-20 MED ORDER — ROPINIROLE HCL 0.5 MG PO TABS: 0.5000 mg | ORAL_TABLET | Freq: Every day | ORAL | 0 refills | Status: DC

## 2024-06-22 ENCOUNTER — Other Ambulatory Visit: Payer: Self-pay

## 2024-06-22 DIAGNOSIS — G2581 Restless legs syndrome: Secondary | ICD-10-CM

## 2024-06-23 ENCOUNTER — Ambulatory Visit: Admitting: Nurse Practitioner

## 2024-06-23 ENCOUNTER — Encounter: Payer: Self-pay | Admitting: Nurse Practitioner

## 2024-06-23 VITALS — BP 204/130 | HR 84 | Temp 98.0°F | Resp 16 | Ht 65.0 in | Wt 234.6 lb

## 2024-06-23 DIAGNOSIS — I700 Atherosclerosis of aorta: Secondary | ICD-10-CM

## 2024-06-23 DIAGNOSIS — I129 Hypertensive chronic kidney disease with stage 1 through stage 4 chronic kidney disease, or unspecified chronic kidney disease: Secondary | ICD-10-CM | POA: Diagnosis not present

## 2024-06-23 DIAGNOSIS — R200 Anesthesia of skin: Secondary | ICD-10-CM

## 2024-06-23 DIAGNOSIS — I10 Essential (primary) hypertension: Secondary | ICD-10-CM

## 2024-06-23 DIAGNOSIS — R7303 Prediabetes: Secondary | ICD-10-CM | POA: Diagnosis not present

## 2024-06-23 DIAGNOSIS — N1832 Chronic kidney disease, stage 3b: Secondary | ICD-10-CM | POA: Diagnosis not present

## 2024-06-23 DIAGNOSIS — G2581 Restless legs syndrome: Secondary | ICD-10-CM

## 2024-06-23 DIAGNOSIS — R300 Dysuria: Secondary | ICD-10-CM

## 2024-06-23 DIAGNOSIS — I480 Paroxysmal atrial fibrillation: Secondary | ICD-10-CM

## 2024-06-23 DIAGNOSIS — M722 Plantar fascial fibromatosis: Secondary | ICD-10-CM | POA: Diagnosis not present

## 2024-06-23 DIAGNOSIS — Z0001 Encounter for general adult medical examination with abnormal findings: Secondary | ICD-10-CM

## 2024-06-23 DIAGNOSIS — G4733 Obstructive sleep apnea (adult) (pediatric): Secondary | ICD-10-CM

## 2024-06-23 NOTE — Progress Notes (Signed)
 Whitney [name] [address] [place], [place] [address]  Internal MEDICINE  Office Visit Note  Patient Name: Whitney [name]  [id number]  [id number]  Date of Service: 06/23/2024  Chief Complaint  Patient presents with   Depression   Hypertension   Medicare Wellness     Whitney [name] presents for a medicare annual wellness visit.  Well-appearing 73 y.o. female with aortic atherosclerosis, CAD, hypertension, AFIB, OSA, asthma, gallstones, diverticulosis, osteoarthritis, lumbar spondylosis, CKD, has single kidney, s/p left nephrectomy, anxiety and depression, insomnia and RLS.  Routine CRC screening: had colonoscopy done in 2022. Due in 2032 Routine mammogram: done in June this year  DEXA scan: done in August 2022, result was normal  Eye exam: due now, patient will call  foot exam: done  Labs: recent labs done  New or worsening pain: none  Other concerns: Significant other passed away from cancer in 05-07-25.  Significantly Elevated blood pressure today, did not take BP medication today and is tearful and upset talking about her significant other. Also has a mild headache in the occipital part of the head.  Going grief counseling.  See nephrology for kidney function, has upcoming appt but needs to have it done at the [place] location.  Needs a new podiatrist closer to home.        06/23/2024    3:02 PM 03/11/2023    1:46 PM 11/12/2020   11:09 AM  MMSE - Mini Mental State Exam  Orientation to time 5 5 5   Orientation to Place 5 5 5   Registration 3 3 3   Attention/ Calculation 5 5 5   Recall 3 3 3   Language- name 2 objects 2 2 2   Language- repeat 1 1 1   Language- follow 3 step command 3 3 3   Language- read & follow direction 1 1 1   Write a sentence 0 0 0  Copy design 1 1 1   Total score 29 29 29     Functional Status Survey: Is the patient deaf or have difficulty hearing?: No Does the patient have difficulty seeing, even when wearing glasses/contacts?: No Does the  patient have difficulty concentrating, remembering, or making decisions?: Yes Does the patient have difficulty walking or climbing stairs?: No Does the patient have difficulty dressing or bathing?: No Does the patient have difficulty doing errands alone such as visiting a doctor's office or shopping?: No     04/29/2022    8:58 AM 09/01/2022   10:06 AM 03/11/2023    1:45 PM 07/06/2023   11:07 AM 06/23/2024    3:01 PM  Fall Risk  Falls in the past year? 0 0 1 1 0  Was there an injury with Fall? 0  0  0  0  0   Fall Risk Category Calculator 0 0 1 2 0  Fall Risk Category (Retired) Low       (RETIRED) Patient Fall Risk Level Low fall risk       Patient at Risk for Falls Due to No Fall Risks No Fall Risks     Fall risk Follow up Falls evaluation completed  Falls evaluation completed Falls evaluation completed  Falls evaluation completed     Data saved with a previous flowsheet row definition       07/06/2023   11:07 AM  Depression screen PHQ 2/9  Decreased Interest 0  Down, Depressed, Hopeless 0  PHQ - 2 Score 0       Current Medication: Outpatient Encounter Medications as of 06/23/2024  Medication Sig  albuterol  (VENTOLIN  HFA) 108 (90 Base) MCG/ACT inhaler Inhale 2 puffs into the lungs every 4 (four) hours as needed for wheezing or shortness of breath.   diclofenac Sodium (VOLTAREN) 1 % GEL Apply 2 g topically daily as needed (Knee pain).   docusate sodium  (COLACE) 100 MG capsule Take 1 capsule (100 mg total) by mouth 2 (two) times daily.   gabapentin  (NEURONTIN ) 100 MG capsule Take 200 mg by mouth 2 (two) times daily.   methocarbamol  (ROBAXIN ) 500 MG tablet TAKE 1 TABLET(500 MG) BY MOUTH EVERY 8 HOURS AS NEEDED FOR MUSCLE SPASMS   [DISCONTINUED] amLODipine  (NORVASC ) 10 MG tablet TAKE 1 TABLET(10 MG) BY MOUTH DAILY   [DISCONTINUED] aspirin  81 MG chewable tablet Chew 1 tablet (81 mg total) by mouth 2 (two) times daily.   [DISCONTINUED] carvedilol  (COREG ) 12.5 MG tablet Take 1  tablet by mouth 2 (two) times daily.   [DISCONTINUED] famotidine  (PEPCID ) 20 MG tablet Take one tab po bid before meals   [DISCONTINUED] hydrALAZINE  (APRESOLINE ) 25 MG tablet Take 1 tablet (25 mg total) by mouth 3 (three) times daily.   [DISCONTINUED] magnesium  oxide (MAG-OX) 400 MG tablet Take 1 tablet (400 mg total) by mouth daily.   [DISCONTINUED] orlistat  (ALLI ) 60 MG capsule Take 60 mg by mouth 3 (three) times daily with meals.   [DISCONTINUED] rOPINIRole  (REQUIP ) 0.5 MG tablet Take 1 tablet (0.5 mg total) by mouth at bedtime.   [DISCONTINUED] rosuvastatin  (CRESTOR ) 40 MG tablet Take 1 tablet (40 mg total) by mouth daily.   [DISCONTINUED] Vitamin D , Ergocalciferol , (DRISDOL ) 1.25 MG (50000 UNIT) CAPS capsule Take 1 capsule (50,000 Units total) by mouth every 7 (seven) days.   No facility-administered encounter medications on file as of 06/23/2024.    Surgical History: Past Surgical History:  Procedure Laterality Date   BIOPSY  10/31/2019   Procedure: BIOPSY;  Surgeon: Wilhelmenia Aloha Raddle., MD;  Location: [facility] ENDOSCOPY;  Service: Gastroenterology;;   CARDIAC CATHETERIZATION     CHOLECYSTECTOMY     COLONOSCOPY WITH PROPOFOL  N/A 06/13/2019   Procedure: COLONOSCOPY WITH PROPOFOL ;  Surgeon: Therisa Bi, MD;  Location: [facility] ENDOSCOPY;  Service: Gastroenterology;  Laterality: N/A;   COLONOSCOPY WITH PROPOFOL  N/A 08/12/2019   Procedure: COLONOSCOPY WITH PROPOFOL ;  Surgeon: Therisa Bi, MD;  Location: [facility] ENDOSCOPY;  Service: Gastroenterology;  Laterality: N/A;   COLONOSCOPY WITH PROPOFOL  N/A 09/19/2019   Procedure: COLONOSCOPY WITH PROPOFOL ;  Surgeon: Therisa Bi, MD;  Location[name];  Service: Gastroenterology;  Laterality: N/A;   COLONOSCOPY WITH PROPOFOL  N/A 06/04/2021   Procedure: COLONOSCOPY WITH PROPOFOL ;  Surgeon: Therisa Bi, MD;  Location[name];  Service: Gastroenterology;  Laterality: N/A;   DILATION AND CURETTAGE OF UTERUS     ESOPHAGOGASTRODUODENOSCOPY (EGD) WITH  PROPOFOL  N/A 06/13/2019   Procedure: ESOPHAGOGASTRODUODENOSCOPY (EGD) WITH PROPOFOL ;  Surgeon: Therisa Bi, MD;  Location: [facility] ENDOSCOPY;  Service: Gastroenterology;  Laterality: N/A;  Pt will go for COVID test on 06-08-19 as she uses public transportation and will be in the area on that day.    ESOPHAGOGASTRODUODENOSCOPY (EGD) WITH PROPOFOL  N/A 10/31/2019   Procedure: ESOPHAGOGASTRODUODENOSCOPY (EGD) WITH PROPOFOL ;  Surgeon: Wilhelmenia Aloha Raddle., MD;  Location: [name] END[facility]COPY;  Service: Gastroenterology;  Laterality: N/A;   ESOPHAGOGASTRODUODENOSCOPY (EGD) WITH PROPOFOL  N/A 06/04/2021   Procedure: ESOPHAGOGASTRODUODENOSCOPY (EGD) WITH PROPOFOL ;  Surgeon: Therisa Bi, MD;  Location: [facility] ENDOSCOPY;  Service: Gastroenterology;  Laterality: N/A;   HEMOSTASIS CLIP PLACEMENT  10/31/2019   Procedure: HEMOSTASIS CLIP PLACEMENT;  Surgeon: Wilhelmenia Aloha Raddle., MD;  Location: [name]  ENDOSCOPY;  Service: Gastroenterology;;   HERNIA REPAIR     HYSTEROSCOPY WITH D & C N/A 02/16/2017   Procedure: DILATATION AND CURETTAGE /HYSTEROSCOPY;  Surgeon: Defrancesco, Gladis LABOR, MD;  Location: [facility] ORS;  Service: Gynecology;  Laterality: N/A;   JOINT REPLACEMENT Left    knee   kidney removal Left    KIDNEY SURGERY Left 1979   left kidney removed   LEFT HEART CATH AND CORONARY ANGIOGRAPHY N/A 10/21/2017   Procedure: LEFT HEART CATH AND CORONARY ANGIOGRAPHY;  Surgeon: Bosie Vinie LABOR, MD;  Location: [facility] INVASIVE CV LAB;  Service: Cardiovascular;  Laterality: N/A;   POLYPECTOMY  10/31/2019   Procedure: POLYPECTOMY;  Surgeon: Wilhelmenia Aloha Raddle., MD;  Location: [name] END[facility]COPY;  Service: Gastroenterology;;   TOTAL KNEE ARTHROPLASTY Left 07/06/2017   Procedure: TOTAL KNEE ARTHROPLASTY;  Surgeon: Leora Lynwood SAUNDERS, MD;  Location: [facility] ORS;  Service: Orthopedics;  Laterality: Left;   TOTAL KNEE ARTHROPLASTY Right 02/25/2021   Procedure: TOTAL KNEE ARTHROPLASTY;  Surgeon: Leora Lynwood SAUNDERS, MD;  Location: [facility] ORS;  Service:  Orthopedics;  Laterality: Right;   UPPER ESOPHAGEAL ENDOSCOPIC ULTRASOUND (EUS) N/A 10/31/2019   Procedure: UPPER ESOPHAGEAL ENDOSCOPIC ULTRASOUND (EUS);  Surgeon: Wilhelmenia Aloha Raddle., MD;  Location: [facility] ENDOSCOPY;  Service: Gastroenterology;  Laterality: N/A;   WRIST ARTHROSCOPY Left 1970s   XI ROBOTIC ASSISTED VENTRAL HERNIA N/A 12/01/2019   Procedure: XI ROBOTIC ASSISTED VENTRAL HERNIA;  Surgeon: Jordis Laneta FALCON, MD;  Location: [facility] ORS;  Service: General;  Laterality: N/A;    Medical History: Past Medical History:  Diagnosis Date   Abnormal Pap smear of cervix    Pt states she had colposcopy    Anxiety    Aortic atherosclerosis    Arthritis    CAD (coronary artery disease)    Chronic kidney disease    s/p LEFT nephrectomy   Chronic lower back pain    COVID    Depression    Diverticulitis    Dyspnea    History of methicillin resistant staphylococcus aureus (MRSA) 12/21/2020   nasal swab pcr   Hx of unilateral nephrectomy 1979   Left Nephrectomy   Hypertension    Lower extremity edema    PAF (paroxysmal atrial fibrillation) (HCC)    Palpitations    PMB (postmenopausal bleeding)    PVC's (premature ventricular contractions) 10/18/2018   Restless leg syndrome    Sleep apnea    has no cpap   Symptomatic bradycardia    Vitamin D  deficiency     Family History: Family History  Problem Relation Age of Onset   Ovarian cancer Mother    Hypertension Father    Diabetes Father    Cancer Father    Breast cancer Sister    Diabetes Sister     Social History   Socioeconomic History   Marital status: Divorced    Spouse name: Not on file   Number of children: Not on file   Years of education: Not on file   Highest education level: Not on file  Occupational History   Not on file  Tobacco Use   Smoking status: Never   Smokeless tobacco: Never  Vaping Use   Vaping status: Never Used  Substance and Sexual Activity   Alcohol use: Yes    Alcohol/week: 1.0 standard drink  of alcohol    Types: 1 Standard drinks or equivalent per week    Comment: occasionally   Drug use: Yes    Frequency: 3.0 times per week  Types: Marijuana    Comment: 07/06/23: Pt states she uses for leg pain: marijuana; states no longer uses cocaine   Sexual activity: Yes    Birth control/protection: Post-menopausal  Other Topics Concern   Not on file  Social History Narrative   Lives with cousin.   Social Drivers of Health   Tobacco Use: Low Risk (07/26/2024)   Patient History    Smoking Tobacco Use: Never    Smokeless Tobacco Use: Never    Passive Exposure: Not on file  Financial Resource Strain: Not on file  Food Insecurity: Low Risk (02/03/2024)   Received from [facility]   Epic    Within the past 12 months, you worried that your food would run out before you got money to buy more: Never true    Within the past 12 months, the food you bought just didn't last and you didn't have money to get more. : Never true  Transportation Needs: No Transportation Needs (02/03/2024)   Received from [facility]    In the past 12 months, has lack of reliable transportation kept you from medical appointments, meetings, work or from getting things needed for daily living? : No  Physical Activity: Not on file  Stress: Not on file  Social Connections: Not on file  Intimate Partner Violence: Not on file  Depression (PHQ2-9): Low Risk (07/06/2023)   Depression (PHQ2-9)    PHQ-2 Score: 0  Alcohol Screen: Low Risk (04/29/2022)   Alcohol Screen    Last Alcohol Screening Score (AUDIT): 2  Housing: Low Risk (02/03/2024)   Received from [facility]   Epic    What is your living situation today?: I have a steady place to live    Think about the place you live. Do you have problems with any of the following? Choose all that apply:: None/None on this list  Utilities: Low Risk (02/03/2024)   Received from [facility]   Utilities    In the past 12 months has the electric, gas,  oil, or water company threatened to shut off services in your home? : No  Health Literacy: Not on file      Review of Systems  Constitutional:  Positive for appetite change, fatigue and unexpected weight change. Negative for activity change, chills and fever.  HENT: Negative.  Negative for congestion, ear pain, rhinorrhea, sore throat and trouble swallowing.   Eyes: Negative.   Respiratory: Negative.  Negative for cough, chest tightness, shortness of breath and wheezing.   Cardiovascular: Negative.  Negative for chest pain and palpitations.  Gastrointestinal: Negative.  Negative for abdominal pain, blood in stool, constipation, diarrhea, nausea and vomiting.  Endocrine: Negative.   Genitourinary: Negative.  Negative for difficulty urinating, dysuria, frequency, hematuria and urgency.  Musculoskeletal:  Positive for arthralgias, back pain and gait problem. Negative for joint swelling, myalgias and neck pain.  Skin: Negative.  Negative for rash and wound.  Allergic/Immunologic: Negative.  Negative for immunocompromised state.  Neurological:  Positive for headaches. Negative for dizziness, seizures and numbness.  Hematological: Negative.   Psychiatric/Behavioral:  Positive for depression. Negative for behavioral problems, self-injury and suicidal ideas. The patient is not nervous/anxious.     Vital Signs: BP (!) 204/130 Comment: 198/140  Pulse 84   Temp 98 F (36.7 C)   Resp 16   Ht 5' 5 (1.651 m)   Wt 234 lb 9.6 oz (106.4 kg)   LMP 01/26/2017 Comment: age 70  SpO2 96%   BMI 39.04  kg/m    Physical Exam Vitals reviewed.  Constitutional:      General: She is not in acute distress.    Appearance: Normal appearance. She is obese. She is not ill-appearing.  HENT:     Head: Normocephalic and atraumatic.     Right Ear: Tympanic membrane, ear canal and external ear normal.     Left Ear: Tympanic membrane, ear canal and external ear normal.     Nose: Nose normal. No congestion or  rhinorrhea.     Mouth/Throat:     Mouth: Mucous membranes are moist.     Pharynx: Oropharynx is clear. No oropharyngeal exudate or posterior oropharyngeal erythema.  Eyes:     Extraocular Movements: Extraocular movements intact.     Conjunctiva/sclera: Conjunctivae normal.     Pupils: Pupils are equal, round, and reactive to light.  Neck:     Vascular: No carotid bruit.  Cardiovascular:     Rate and Rhythm: Normal rate and regular rhythm.     Heart sounds: Normal heart sounds. No murmur heard. Pulmonary:     Effort: Pulmonary effort is normal. No respiratory distress.     Breath sounds: Normal breath sounds. No wheezing.  Abdominal:     General: Bowel sounds are normal. There is no distension.     Palpations: Abdomen is soft. There is no mass.     Tenderness: There is no abdominal tenderness. There is no guarding or rebound.     Hernia: No hernia is present.  Musculoskeletal:        General: Normal range of motion.     Cervical back: Normal range of motion and neck supple.  Lymphadenopathy:     Cervical: No cervical adenopathy.  Skin:    General: Skin is warm and dry.     Capillary Refill: Capillary refill takes less than 2 seconds.  Neurological:     Mental Status: She is alert and oriented to person, place, and time.     Cranial Nerves: No cranial nerve deficit.     Coordination: Coordination normal.     Gait: Gait normal.  Psychiatric:        Mood and Affect: Mood normal.        Behavior: Behavior normal.        Thought Content: Thought content normal.        Judgment: Judgment normal.        Assessment/Plan: 1. Encounter for Medicare annual examination with abnormal findings (Primary) Age-appropriate preventive screenings and vaccinations discussed. Routine labs for health maintenance were done earlier this year in june. PHM updated.    2. Paroxysmal atrial fibrillation (HCC) Continue carvedilol  as prescribed.   3. Hypertensive kidney disease with stage 3b  chronic kidney disease (HCC) Sees nephrology regularly, instructed patient to call the kidney doctor's office and tell them she needs an appointment at the [place] location instead of the [facility] location.   4. Essential hypertension Patient was encouraged to go to the hospital but she refused. She stated she will take her uber ride home and immediately take her blood pressure medication. If her blood pressure does not improve or she has any worsening symptoms as discussed with the patient, she will go to the ER.   5. Aortic atherosclerosis Continue rosuvastatin  as prescribed.   6. Prediabetes Referred to podiatry. Encourage low carb low sugar diet and increase physical activity as tolerated.  - Ambulatory referral to Podiatry  7. Restless leg syndrome Continue ropinirole  as prescribed.   8. Obstructive  sleep apnea Continue using cpap as instructed.   9. Plantar fasciitis of left foot Referred to podiatry in [place]  - Ambulatory referral to Podiatry  10. Numbness of toes Referred to podiatry in [place]  - Ambulatory referral to Podiatry  11. Morbid obesity (HCC) Encourage low carb low calorie diet and increase physical activity as tolerated.   12. Dysuria Urine sent to lab  - UA/M w/rflx Culture, Routine - Microscopic Examination     General Counseling: Jerolene verbalizes understanding of the findings of todays visit and agrees with plan of treatment. I have discussed any further diagnostic evaluation that may be needed or ordered today. We also reviewed her medications today. she has been encouraged to call the office with any questions or concerns that should arise related to todays visit.    Orders Placed This Encounter  Procedures   Microscopic Examination   UA/M w/rflx Culture, Routine   Ambulatory referral to Podiatry    No orders of the defined types were placed in this encounter.   Return for f/u 2 weeks BLOOD PRESSURE.   Total time spent:30  Minutes Time spent includes review of chart, medications, test results, and follow up plan with the patient.   [place] Controlled Substance Database was reviewed by me.  This patient was seen by Mardy Maxin, FNP-C in collaboration with Dr. Sigrid Bathe as a part of collaborative care agreement.  Chanan Detwiler R. Maxin, MSN, FNP-C Internal medicine

## 2024-06-24 LAB — MICROSCOPIC EXAMINATION
Casts: NONE SEEN /LPF
RBC, Urine: NONE SEEN /HPF (ref 0–2)
WBC, UA: NONE SEEN /HPF (ref 0–5)

## 2024-06-24 LAB — UA/M W/RFLX CULTURE, ROUTINE
Bilirubin, UA: NEGATIVE
Glucose, UA: NEGATIVE
Ketones, UA: NEGATIVE
Leukocytes,UA: NEGATIVE
Nitrite, UA: NEGATIVE
RBC, UA: NEGATIVE
Specific Gravity, UA: 1.014 (ref 1.005–1.030)
Urobilinogen, Ur: 0.2 mg/dL (ref 0.2–1.0)
pH, UA: 6.5 (ref 5.0–7.5)

## 2024-06-27 ENCOUNTER — Ambulatory Visit: Admitting: Nurse Practitioner

## 2024-07-06 ENCOUNTER — Ambulatory Visit: Admitting: Nurse Practitioner

## 2024-07-06 ENCOUNTER — Ambulatory Visit: Admitting: Surgery

## 2024-07-06 ENCOUNTER — Other Ambulatory Visit
Admission: RE | Admit: 2024-07-06 | Discharge: 2024-07-06 | Disposition: A | Discharge status: Home / Self Care | Source: Ambulatory Visit | Attending: Surgery | Admitting: Surgery

## 2024-07-06 ENCOUNTER — Other Ambulatory Visit: Payer: Self-pay

## 2024-07-06 ENCOUNTER — Encounter: Payer: Self-pay | Admitting: Surgery

## 2024-07-06 VITALS — BP 171/108 | HR 88 | Temp 98.0°F | Ht 65.0 in | Wt 230.0 lb

## 2024-07-06 DIAGNOSIS — K5792 Diverticulitis of intestine, part unspecified, without perforation or abscess without bleeding: Secondary | ICD-10-CM | POA: Diagnosis not present

## 2024-07-06 LAB — COMPREHENSIVE METABOLIC PANEL WITH GFR
ALT: 10 U/L (ref 0–44)
AST: 22 U/L (ref 15–41)
Albumin: 3.8 g/dL (ref 3.5–5.0)
Alkaline Phosphatase: 97 U/L (ref 38–126)
Anion gap: 10 (ref 5–15)
BUN: 34 mg/dL — ABNORMAL HIGH (ref 8–23)
CO2: 24 mmol/L (ref 22–32)
Calcium: 9 mg/dL (ref 8.9–10.3)
Chloride: 105 mmol/L (ref 98–111)
Creatinine, Ser: 2.14 mg/dL — ABNORMAL HIGH (ref 0.44–1.00)
GFR, Estimated: 24 mL/min — ABNORMAL LOW (ref >60)
Glucose, Bld: 99 mg/dL (ref 70–99)
Potassium: 5.2 mmol/L — ABNORMAL HIGH (ref 3.5–5.1)
Sodium: 140 mmol/L (ref 135–145)
Total Bilirubin: 0.3 mg/dL (ref 0.0–1.2)
Total Protein: 7.4 g/dL (ref 6.5–8.1)

## 2024-07-06 LAB — CBC
HCT: 40.3 % (ref 36.0–46.0)
Hemoglobin: 12.9 g/dL (ref 12.0–15.0)
MCH: 29.7 pg (ref 26.0–34.0)
MCHC: 32 g/dL (ref 30.0–36.0)
MCV: 92.6 fL (ref 80.0–100.0)
Platelets: 301 K/uL (ref 150–400)
RBC: 4.35 MIL/uL (ref 3.87–5.11)
RDW: 12.7 % (ref 11.5–15.5)
WBC: 5.7 K/uL (ref 4.0–10.5)
nRBC: 0 % (ref 0.0–0.2)

## 2024-07-06 LAB — PROTIME-INR
INR: 0.9 (ref 0.8–1.2)
Prothrombin Time: 12.7 s (ref 11.4–15.2)

## 2024-07-06 MED ORDER — METRONIDAZOLE 500 MG PO TABS
500.0000 mg | ORAL_TABLET | Freq: Three times a day (TID) | ORAL | 0 refills | Status: AC
  Filled 2024-07-06: qty 42, 14d supply, fill #0

## 2024-07-06 MED ORDER — CIPROFLOXACIN HCL 500 MG PO TABS
500.0000 mg | ORAL_TABLET | Freq: Two times a day (BID) | ORAL | 0 refills | Status: AC
  Filled 2024-07-06: qty 28, 14d supply, fill #0

## 2024-07-06 NOTE — Patient Instructions (Signed)
 We have scheduled you for a CT Scan of your Abdomen and Pelvis with contrast. This has been scheduled at [facility] in [place] on 07/13/24 . Please arrive there by 10:45 . If you need to reschedule your Scan, you may do so by calling [phone]. Please let us  know if you reschedule your scan as we have to get authorization from your insurance for this.   You may get your labs done at [facility]  We sent in 2 prescriptions to [facility]

## 2024-07-07 ENCOUNTER — Ambulatory Visit (INDEPENDENT_AMBULATORY_CARE_PROVIDER_SITE_OTHER): Admitting: Nurse Practitioner

## 2024-07-07 ENCOUNTER — Encounter: Payer: Self-pay | Admitting: Nurse Practitioner

## 2024-07-07 VITALS — BP 170/100 | HR 90 | Temp 96.9°F | Resp 16 | Ht 65.0 in | Wt 233.8 lb

## 2024-07-07 DIAGNOSIS — I700 Atherosclerosis of aorta: Secondary | ICD-10-CM

## 2024-07-07 DIAGNOSIS — I10 Essential (primary) hypertension: Secondary | ICD-10-CM

## 2024-07-07 DIAGNOSIS — N184 Chronic kidney disease, stage 4 (severe): Secondary | ICD-10-CM

## 2024-07-07 DIAGNOSIS — R7303 Prediabetes: Secondary | ICD-10-CM

## 2024-07-07 DIAGNOSIS — M47816 Spondylosis without myelopathy or radiculopathy, lumbar region: Secondary | ICD-10-CM

## 2024-07-07 DIAGNOSIS — R252 Cramp and spasm: Secondary | ICD-10-CM

## 2024-07-07 DIAGNOSIS — G2581 Restless legs syndrome: Secondary | ICD-10-CM

## 2024-07-07 DIAGNOSIS — K219 Gastro-esophageal reflux disease without esophagitis: Secondary | ICD-10-CM

## 2024-07-07 DIAGNOSIS — N2581 Secondary hyperparathyroidism of renal origin: Secondary | ICD-10-CM

## 2024-07-07 DIAGNOSIS — E559 Vitamin D deficiency, unspecified: Secondary | ICD-10-CM

## 2024-07-07 MED ORDER — ROSUVASTATIN CALCIUM 20 MG PO TABS: 20.0000 mg | ORAL_TABLET | Freq: Every day | ORAL | 3 refills | Status: AC

## 2024-07-07 MED ORDER — AMLODIPINE BESYLATE 10 MG PO TABS: 10.0000 mg | ORAL_TABLET | Freq: Every day | ORAL | 3 refills | Status: DC

## 2024-07-07 MED ORDER — ASPIRIN 81 MG PO CHEW: 81.0000 mg | CHEWABLE_TABLET | Freq: Every day | ORAL | 3 refills | Status: AC

## 2024-07-07 MED ORDER — MAGNESIUM OXIDE 400 MG PO TABS: 400.0000 mg | ORAL_TABLET | Freq: Every day | ORAL | 3 refills | Status: AC

## 2024-07-07 MED ORDER — CARVEDILOL 12.5 MG PO TABS: 12.5000 mg | ORAL_TABLET | Freq: Two times a day (BID) | ORAL | 3 refills | Status: AC

## 2024-07-07 MED ORDER — HYDRALAZINE HCL 25 MG PO TABS: 25.0000 mg | ORAL_TABLET | Freq: Three times a day (TID) | ORAL | 3 refills | Status: AC

## 2024-07-07 MED ORDER — ROPINIROLE HCL 0.5 MG PO TABS: 0.5000 mg | ORAL_TABLET | Freq: Every day | ORAL | 3 refills | Status: AC

## 2024-07-07 MED ORDER — VITAMIN D (ERGOCALCIFEROL) 1.25 MG (50000 UNIT) PO CAPS: 50000.0000 [IU] | ORAL_CAPSULE | ORAL | 4 refills | Status: AC

## 2024-07-07 MED ORDER — FAMOTIDINE 20 MG PO TABS: ORAL_TABLET | ORAL | 3 refills | Status: AC

## 2024-07-07 NOTE — Progress Notes (Signed)
 Outpatient Surgical Follow Up   Whitney [name] is an 73 y.o. female.   Chief Complaint  Patient presents with   Follow-up    HPI: Whitney [name] is a 73 year old female known to me with prior history of recurrent diverticulitis.  She does have significant medical issues including coronary artery disease, CKD, chronic diverticulitis, multiple joint replacements.  More recently hospitalized at [facility] for another episode of diverticulitis that was able to be managed with antibiotics. Last time I saw her was 2 1/2 years ago. She did have CT at that time that I have pers reviewed umbilical hernia w diverticulosis . She did have reports from [facility] showing non complicated diverticulitis.CT scan showing New mild peridiverticular stranding in the descending colon may reflect a separate occurrence of early acute diverticulitis.  She has had at least 5 episodes of diverticulitis and some of them requiring hospitalization. Prior ventral H repair 5 years ago, n evidence of complications Comes with recurrent lower and upper abdominal pain on the left side.  Is intermittent moderate intensity no specific elevating or aggravating factors  Past Medical History:  Diagnosis Date   Abnormal Pap smear of cervix    Pt states she had colposcopy    Anxiety    Aortic atherosclerosis    Arthritis    CAD (coronary artery disease)    Chronic kidney disease    s/p LEFT nephrectomy   Chronic lower back pain    COVID    Depression    Diverticulitis    Dyspnea    History of methicillin resistant staphylococcus aureus (MRSA) 12/21/2020   nasal swab pcr   Hx of unilateral nephrectomy 1979   Left Nephrectomy   Hypertension    Lower extremity edema    PAF (paroxysmal atrial fibrillation) (HCC)    Palpitations    PMB (postmenopausal bleeding)    Restless leg syndrome    Sleep apnea    has no cpap   Symptomatic bradycardia    Vitamin D  deficiency     Past Surgical History:  Procedure Laterality Date   BIOPSY   10/31/2019   Procedure: BIOPSY;  Surgeon: Whitney [name]., MD;  Location: [facility] ENDOSCOPY;  Service: Gastroenterology;;   CARDIAC CATHETERIZATION     CHOLECYSTECTOMY     COLONOSCOPY WITH PROPOFOL  N/A 06/13/2019   Procedure: COLONOSCOPY WITH PROPOFOL ;  Surgeon: Whitney Bi, MD;  Location: [facility] ENDOSCOPY;  Service: Gastroenterology;  Laterality: N/A;   COLONOSCOPY WITH PROPOFOL  N/A 08/12/2019   Procedure: COLONOSCOPY WITH PROPOFOL ;  Surgeon: Whitney Bi, MD;  Location: [facility] ENDOSCOPY;  Service: Gastroenterology;  Laterality: N/A;   COLONOSCOPY WITH PROPOFOL  N/A 09/19/2019   Procedure: COLONOSCOPY WITH PROPOFOL ;  Surgeon: Whitney Bi, MD;  Location: [facility] ENDOSCOPY;  Service: Gastroenterology;  Laterality: N/A;   COLONOSCOPY WITH PROPOFOL  N/A 06/04/2021   Procedure: COLONOSCOPY WITH PROPOFOL ;  Surgeon: Whitney Bi, MD;  Location: [facility] ENDOSCOPY;  Service: Gastroenterology;  Laterality: N/A;   DILATION AND CURETTAGE OF UTERUS     ESOPHAGOGASTRODUODENOSCOPY (EGD) WITH PROPOFOL  N/A 06/13/2019   Procedure: ESOPHAGOGASTRODUODENOSCOPY (EGD) WITH PROPOFOL ;  Surgeon: Whitney Bi, MD;  Location: [facility] ENDOSCOPY;  Service: Gastroenterology;  Laterality: N/A;  Pt will go for COVID test on 06-08-19 as she uses public transportation and will be in the area on that day.    ESOPHAGOGASTRODUODENOSCOPY (EGD) WITH PROPOFOL  N/A 10/31/2019   Procedure: ESOPHAGOGASTRODUODENOSCOPY (EGD) WITH PROPOFOL ;  Surgeon: Whitney [name]., MD;  Location: [facility] ENDOSCOPY;  Service: Gastroenterology;  Laterality: N/A;   ESOPHAGOGASTRODUODENOSCOPY (EGD) WITH PROPOFOL   N/A 06/04/2021   Procedure: ESOPHAGOGASTRODUODENOSCOPY (EGD) WITH PROPOFOL ;  Surgeon: Whitney Bi, MD;  Location: [facility] ENDOSCOPY;  Service: Gastroenterology;  Laterality: N/A;   HEMOSTASIS CLIP PLACEMENT  10/31/2019   Procedure: HEMOSTASIS CLIP PLACEMENT;  Surgeon: Whitney [name]., MD;  Location: [facility] ENDOSCOPY;  Service: Gastroenterology;;   HERNIA REPAIR      HYSTEROSCOPY WITH D & C N/A 02/16/2017   Procedure: DILATATION AND CURETTAGE /HYSTEROSCOPY;  Surgeon: Whitney Gladis LABOR, MD;  Location: [facility] ORS;  Service: Gynecology;  Laterality: N/A;   JOINT REPLACEMENT Left    knee   kidney removal Left    KIDNEY SURGERY Left 1979   left kidney removed   LEFT HEART CATH AND CORONARY ANGIOGRAPHY N/A 10/21/2017   Procedure: LEFT HEART CATH AND CORONARY ANGIOGRAPHY;  Surgeon: Whitney Vinie LABOR, MD;  Location: [facility] INVASIVE CV LAB;  Service: Cardiovascular;  Laterality: N/A;   POLYPECTOMY  10/31/2019   Procedure: POLYPECTOMY;  Surgeon: Whitney [name]., MD;  Location: [facility] ENDOSCOPY;  Service: Gastroenterology;;   TOTAL KNEE ARTHROPLASTY Left 07/06/2017   Procedure: TOTAL KNEE ARTHROPLASTY;  Surgeon: Whitney Lynwood SAUNDERS, MD;  Location: [facility] ORS;  Service: Orthopedics;  Laterality: Left;   TOTAL KNEE ARTHROPLASTY Right 02/25/2021   Procedure: TOTAL KNEE ARTHROPLASTY;  Surgeon: Whitney Lynwood SAUNDERS, MD;  Location: [facility] ORS;  Service: Orthopedics;  Laterality: Right;   UPPER ESOPHAGEAL ENDOSCOPIC ULTRASOUND (EUS) N/A 10/31/2019   Procedure: UPPER ESOPHAGEAL ENDOSCOPIC ULTRASOUND (EUS);  Surgeon: Whitney [name]., MD;  Location: [facility] ENDOSCOPY;  Service: Gastroenterology;  Laterality: N/A;   WRIST ARTHROSCOPY Left 1970s   XI ROBOTIC ASSISTED VENTRAL HERNIA N/A 12/01/2019   Procedure: XI ROBOTIC ASSISTED VENTRAL HERNIA;  Surgeon: Whitney Laneta FALCON, MD;  Location: [facility] ORS;  Service: General;  Laterality: N/A;    Family History  Problem Relation Age of Onset   Ovarian cancer Mother    Hypertension Father    Diabetes Father    Cancer Father    Breast cancer Sister    Diabetes Sister     Social History:  reports that she has never smoked. She has never used smokeless tobacco. She reports current alcohol use of about 1.0 standard drink of alcohol per week. She reports current drug use. Frequency: 3.00 times per week. Drug: Marijuana.  Allergies:  Allergies   Allergen Reactions   Enalapril Swelling and Other (See Comments)   Lisinopril Swelling   Ace Inhibitors Swelling    And ARB    Medications reviewed.    ROS Full ROS performed and is otherwise negative other than what is stated in HPI   BP (!) 171/108   Pulse 88   Temp 98 F (36.7 C) (Oral)   Ht 5' 5 (1.651 m)   Wt 230 lb (104.3 kg)   LMP 01/26/2017 Comment: age 63  SpO2 97%   BMI 38.27 kg/m   Physical Exam Vitals and nursing note reviewed. Exam conducted with a chaperone present.  Constitutional:      Appearance: She is obese. She is not ill-appearing.  Cardiovascular:     Rate and Rhythm: Normal rate.     Heart sounds:     No friction rub.  Pulmonary:     Effort: Pulmonary effort is normal.     Breath sounds: No stridor.  Abdominal:     General: There is no distension.     Palpations: There is no mass.     Tenderness: There is no rebound.     Hernia: No hernia is  present.     Comments: Mild tenderness Left upper quadrant w/o rebound, difficult to tell if there is a hernia defect  Musculoskeletal:     Cervical back: Normal range of motion and neck supple. No rigidity or tenderness.  Skin:    General: Skin is warm and dry.     Capillary Refill: Capillary refill takes less than 2 seconds.  Neurological:     General: No focal deficit present.     Mental Status: She is alert and oriented to person, place, and time.  Psychiatric:        Mood and Affect: Mood normal.        Behavior: Behavior normal.        Thought Content: Thought content normal.        Judgment: Judgment normal.         Assessment/Plan: 73 year old female with multiple recurrent episode of diverticulitis in addition to complex medical issues.  Not sure if she has an additional ventral defect at this time.  I would like to start with CT scan abdomen pelvis without IV contrast and also obtain some baseline labs. He is not peritonitic and in no acute distress.  I am not sure if she had  another recurrence of diverticulitis but to be on the cautious side send I we will do a short course of antibiotic therapy. He may require elective sigmoid colectomy at some point in time given persistent recurrence of diverticulitis I personally spent a total of 40 minutes in the care of the patient today including performing a medically appropriate exam/evaluation, counseling and educating, placing orders, referring and communicating with other health care professionals, documenting clinical information in the EHR, independently interpreting and reviewing images studies and coordinating care.   Laneta Luna, MD [facility] General Surgeon

## 2024-07-07 NOTE — Progress Notes (Signed)
 [facility] [address] [place], [place] [address]  Internal MEDICINE  Office Visit Note  Patient Name: Whitney [name]  [id number]  [id number]  Date of Service: 07/07/2024  Chief Complaint  Patient presents with   Depression   Hypertension   Follow-up    Bp f/u    HPI Anayansi presents for a follow-up visit for uncontrolled hypertension.  Hypertension -- elevated BP today. Taking amlodipine  and hydralazine  but has not restart carvedilol  yet.  Switch to mail order pharmacy -- due to transportation issues, she could like to utilize [facility] which is the mail order pharmacy that [facility] uses.  High cholesterol -- reports that 40 mg rosuvastatin  makes her muscles feel really sore. Wants to decrease the dose to 20 mg. Her LDL was very high in June this year and needs to be rechecked.  She is overdue for repeat labs.  She has hyperparathyroidism secondary to CKD stage 4 Stage 4 CKD -- most recent eGFR was 24 Prediabetes -- last A1c was normal. Her highest A1c on record was 5.9. Vitamin D  deficiency -- her level has been below 10. Her most recent vitamin D  level was 12.1 in June this year.    Current Medication: Outpatient Encounter Medications as of 07/07/2024  Medication Sig   rosuvastatin  (CRESTOR ) 20 MG tablet Take 1 tablet (20 mg total) by mouth daily.   albuterol  (VENTOLIN  HFA) 108 (90 Base) MCG/ACT inhaler Inhale 2 puffs into the lungs every 4 (four) hours as needed for wheezing or shortness of breath.   amLODipine  (NORVASC ) 10 MG tablet Take 1 tablet (10 mg total) by mouth daily.   aspirin  81 MG chewable tablet Chew 1 tablet (81 mg total) by mouth daily.   carvedilol  (COREG ) 12.5 MG tablet Take 1 tablet (12.5 mg total) by mouth 2 (two) times daily.   ciprofloxacin  (CIPRO ) 500 MG tablet Take 1 tablet (500 mg total) by mouth 2 (two) times daily for 14 days.   diclofenac Sodium (VOLTAREN) 1 % GEL Apply 2 g topically daily as needed (Knee pain).   docusate  sodium (COLACE) 100 MG capsule Take 1 capsule (100 mg total) by mouth 2 (two) times daily.   famotidine  (PEPCID ) 20 MG tablet Take one tab po bid before meals   gabapentin  (NEURONTIN ) 100 MG capsule Take 200 mg by mouth 2 (two) times daily.   hydrALAZINE  (APRESOLINE ) 25 MG tablet Take 1 tablet (25 mg total) by mouth 3 (three) times daily.   magnesium  oxide (MAG-OX) 400 MG tablet Take 1 tablet (400 mg total) by mouth daily.   methocarbamol  (ROBAXIN ) 500 MG tablet TAKE 1 TABLET(500 MG) BY MOUTH EVERY 8 HOURS AS NEEDED FOR MUSCLE SPASMS   metroNIDAZOLE  (FLAGYL ) 500 MG tablet Take 1 tablet (500 mg total) by mouth 3 (three) times daily for 14 days.   rOPINIRole  (REQUIP ) 0.5 MG tablet Take 1 tablet (0.5 mg total) by mouth at bedtime.   Vitamin D , Ergocalciferol , (DRISDOL ) 1.25 MG (50000 UNIT) CAPS capsule Take 1 capsule (50,000 Units total) by mouth every 7 (seven) days.   [DISCONTINUED] amLODipine  (NORVASC ) 10 MG tablet TAKE 1 TABLET(10 MG) BY MOUTH DAILY   [DISCONTINUED] aspirin  81 MG chewable tablet Chew 1 tablet (81 mg total) by mouth 2 (two) times daily.   [DISCONTINUED] carvedilol  (COREG ) 12.5 MG tablet Take 1 tablet by mouth 2 (two) times daily.   [DISCONTINUED] famotidine  (PEPCID ) 20 MG tablet Take one tab po bid before meals   [DISCONTINUED] hydrALAZINE  (APRESOLINE ) 25 MG tablet  Take 1 tablet (25 mg total) by mouth 3 (three) times daily.   [DISCONTINUED] magnesium  oxide (MAG-OX) 400 MG tablet Take 1 tablet (400 mg total) by mouth daily.   [DISCONTINUED] orlistat  (ALLI ) 60 MG capsule Take 60 mg by mouth 3 (three) times daily with meals.   [DISCONTINUED] rOPINIRole  (REQUIP ) 0.5 MG tablet Take 1 tablet (0.5 mg total) by mouth at bedtime.   [DISCONTINUED] rosuvastatin  (CRESTOR ) 40 MG tablet Take 1 tablet (40 mg total) by mouth daily.   [DISCONTINUED] Vitamin D , Ergocalciferol , (DRISDOL ) 1.25 MG (50000 UNIT) CAPS capsule Take 1 capsule (50,000 Units total) by mouth every 7 (seven) days.   No  facility-administered encounter medications on file as of 07/07/2024.    Surgical History: Past Surgical History:  Procedure Laterality Date   BIOPSY  10/31/2019   Procedure: BIOPSY;  Surgeon: Wilhelmenia Aloha Raddle., MD;  Location: [facility] ENDOSCOPY;  Service: Gastroenterology;;   CARDIAC CATHETERIZATION     CHOLECYSTECTOMY     COLONOSCOPY WITH PROPOFOL  N/A 06/13/2019   Procedure: COLONOSCOPY WITH PROPOFOL ;  Surgeon: Therisa Bi, MD;  Location: [facility] ENDOSCOPY;  Service: Gastroenterology;  Laterality: N/A;   COLONOSCOPY WITH PROPOFOL  N/A 08/12/2019   Procedure: COLONOSCOPY WITH PROPOFOL ;  Surgeon: Therisa Bi, MD;  Location: [facility] ENDOSCOPY;  Service: Gastroenterology;  Laterality: N/A;   COLONOSCOPY WITH PROPOFOL  N/A 09/19/2019   Procedure: COLONOSCOPY WITH PROPOFOL ;  Surgeon: Therisa Bi, MD;  Location: [facility] ENDOSCOPY;  Service: Gastroenterology;  Laterality: N/A;   COLONOSCOPY WITH PROPOFOL  N/A 06/04/2021   Procedure: COLONOSCOPY WITH PROPOFOL ;  Surgeon: Therisa Bi, MD;  Location: [facility] ENDOSCOPY;  Service: Gastroenterology;  Laterality: N/A;   DILATION AND CURETTAGE OF UTERUS     ESOPHAGOGASTRODUODENOSCOPY (EGD) WITH PROPOFOL  N/A 06/13/2019   Procedure: ESOPHAGOGASTRODUODENOSCOPY (EGD) WITH PROPOFOL ;  Surgeon: Therisa Bi, MD;  Location: [facility] ENDOSCOPY;  Service: Gastroenterology;  Laterality: N/A;  Pt will go for COVID test on 06-08-19 as she uses public transportation and will be in the area on that day.    ESOPHAGOGASTRODUODENOSCOPY (EGD) WITH PROPOFOL  N/A 10/31/2019   Procedure: ESOPHAGOGASTRODUODENOSCOPY (EGD) WITH PROPOFOL ;  Surgeon: Wilhelmenia Aloha Raddle., MD;  Location: [facility] ENDOSCOPY;  Service: Gastroenterology;  Laterality: N/A;   ESOPHAGOGASTRODUODENOSCOPY (EGD) WITH PROPOFOL  N/A 06/04/2021   Procedure: ESOPHAGOGASTRODUODENOSCOPY (EGD) WITH PROPOFOL ;  Surgeon: Therisa Bi, MD;  Location: [facility] ENDOSCOPY;  Service: Gastroenterology;  Laterality: N/A;   HEMOSTASIS CLIP PLACEMENT  10/31/2019    Procedure: HEMOSTASIS CLIP PLACEMENT;  Surgeon: Wilhelmenia Aloha Raddle., MD;  Location: [facility] ENDOSCOPY;  Service: Gastroenterology;;   HERNIA REPAIR     HYSTEROSCOPY WITH D & C N/A 02/16/2017   Procedure: DILATATION AND CURETTAGE /HYSTEROSCOPY;  Surgeon: Kathe Gladis LABOR, MD;  Location: [facility] ORS;  Service: Gynecology;  Laterality: N/A;   JOINT REPLACEMENT Left    knee   kidney removal Left    KIDNEY SURGERY Left 1979   left kidney removed   LEFT HEART CATH AND CORONARY ANGIOGRAPHY N/A 10/21/2017   Procedure: LEFT HEART CATH AND CORONARY ANGIOGRAPHY;  Surgeon: Bosie Vinie LABOR, MD;  Location: [facility] INVASIVE CV LAB;  Service: Cardiovascular;  Laterality: N/A;   POLYPECTOMY  10/31/2019   Procedure: POLYPECTOMY;  Surgeon: Wilhelmenia Aloha Raddle., MD;  Location: [facility] ENDOSCOPY;  Service: Gastroenterology;;   TOTAL KNEE ARTHROPLASTY Left 07/06/2017   Procedure: TOTAL KNEE ARTHROPLASTY;  Surgeon: Leora Lynwood SAUNDERS, MD;  Location: [facility] ORS;  Service: Orthopedics;  Laterality: Left;   TOTAL KNEE ARTHROPLASTY Right 02/25/2021   Procedure: TOTAL KNEE ARTHROPLASTY;  Surgeon: Leora Lynwood SAUNDERS, MD;  Location: [facility] ORS;  Service: Orthopedics;  Laterality: Right;   UPPER ESOPHAGEAL ENDOSCOPIC ULTRASOUND (EUS) N/A 10/31/2019   Procedure: UPPER ESOPHAGEAL ENDOSCOPIC ULTRASOUND (EUS);  Surgeon: Wilhelmenia Aloha Raddle., MD;  Location: [facility] ENDOSCOPY;  Service: Gastroenterology;  Laterality: N/A;   WRIST ARTHROSCOPY Left 1970s   XI ROBOTIC ASSISTED VENTRAL HERNIA N/A 12/01/2019   Procedure: XI ROBOTIC ASSISTED VENTRAL HERNIA;  Surgeon: Jordis Laneta FALCON, MD;  Location: [facility] ORS;  Service: General;  Laterality: N/A;    Medical History: Past Medical History:  Diagnosis Date   Abnormal Pap smear of cervix    Pt states she had colposcopy    Anxiety    Aortic atherosclerosis    Arthritis    CAD (coronary artery disease)    Chronic kidney disease    s/p LEFT nephrectomy   Chronic lower back pain    COVID    Depression     Diverticulitis    Dyspnea    History of methicillin resistant staphylococcus aureus (MRSA) 12/21/2020   nasal swab pcr   Hx of unilateral nephrectomy 1979   Left Nephrectomy   Hypertension    Lower extremity edema    PAF (paroxysmal atrial fibrillation) (HCC)    Palpitations    PMB (postmenopausal bleeding)    PVC's (premature ventricular contractions) 10/18/2018   Restless leg syndrome    Sleep apnea    has no cpap   Symptomatic bradycardia    Vitamin D  deficiency     Family History: Family History  Problem Relation Age of Onset   Ovarian cancer Mother    Hypertension Father    Diabetes Father    Cancer Father    Breast cancer Sister    Diabetes Sister     Social History   Socioeconomic History   Marital status: Divorced    Spouse name: Not on file   Number of children: Not on file   Years of education: Not on file   Highest education level: Not on file  Occupational History   Not on file  Tobacco Use   Smoking status: Never   Smokeless tobacco: Never  Vaping Use   Vaping status: Never Used  Substance and Sexual Activity   Alcohol use: Yes    Alcohol/week: 1.0 standard drink of alcohol    Types: 1 Standard drinks or equivalent per week    Comment: occasionally   Drug use: Yes    Frequency: 3.0 times per week    Types: Marijuana    Comment: 07/06/23: Pt states she uses for leg pain: marijuana; states no longer uses cocaine   Sexual activity: Yes    Birth control/protection: Post-menopausal  Other Topics Concern   Not on file  Social History Narrative   Lives with cousin.   Social Drivers of Corporate Investment Banker Strain: Low Risk  (03/22/2021)   Overall Financial Resource Strain (CARDIA)    Difficulty of Paying Living Expenses: Not very hard  Food Insecurity: Low Risk  (02/03/2024)   Received from [facility]   Hunger Vital Sign    Within the past 12 months, you worried that your food would run out before you got money to buy more: Never true     Within the past 12 months, the food you bought just didn't last and you didn't have money to get more. : Never true  Transportation Needs: No Transportation Needs (02/03/2024)   Received from [facility]    In the past 12 months, has  lack of reliable transportation kept you from medical appointments, meetings, work or from getting things needed for daily living? : No  Physical Activity: Not on file  Stress: Not on file  Social Connections: Not on file  Intimate Partner Violence: Not on file      Review of Systems  Constitutional:  Positive for fatigue. Negative for chills and unexpected weight change.  HENT: Negative.  Negative for congestion, rhinorrhea, sneezing and sore throat.   Eyes:  Negative for redness.  Respiratory: Negative.  Negative for cough, chest tightness, shortness of breath and wheezing.   Cardiovascular: Negative.  Negative for chest pain and palpitations.  Gastrointestinal: Negative.  Negative for abdominal pain, constipation, diarrhea, nausea and vomiting.  Genitourinary: Negative.  Negative for dysuria and frequency.  Musculoskeletal: Negative.  Negative for arthralgias, back pain, joint swelling and neck pain.  Skin:  Negative for rash.  Neurological:  Negative for dizziness, tremors, numbness and headaches.  Hematological:  Negative for adenopathy. Does not bruise/bleed easily.  Psychiatric/Behavioral:  Negative for behavioral problems (Depression), self-injury, sleep disturbance and suicidal ideas. The patient is nervous/anxious.     Vital Signs: BP (!) 170/100 (BP Location: Left Arm, Patient Position: Sitting) Comment: 170/110  Pulse 90   Temp (!) 96.9 F (36.1 C)   Resp 16   Ht 5' 5 (1.651 m)   Wt 233 lb 12.8 oz (106.1 kg)   LMP 01/26/2017 Comment: age 73  SpO2 98%   BMI 38.91 kg/m    Physical Exam Vitals reviewed.  Constitutional:      General: She is not in acute distress.    Appearance: Normal appearance. She is obese. She  is not ill-appearing.  HENT:     Head: Normocephalic and atraumatic.  Eyes:     Pupils: Pupils are equal, round, and reactive to light.  Cardiovascular:     Rate and Rhythm: Normal rate and regular rhythm.  Pulmonary:     Effort: Pulmonary effort is normal. No respiratory distress.  Neurological:     Mental Status: She is alert and oriented to person, place, and time.     Cranial Nerves: No cranial nerve deficit.     Coordination: Coordination normal.     Gait: Gait normal.  Psychiatric:        Mood and Affect: Mood normal.        Behavior: Behavior normal.        Assessment/Plan: 1. Stage 4 chronic kidney disease (HCC) (Primary) Repeat labs ordered and patient will call [facility] to schedule an appointment at their [place] location instead of the [place] location due to the transportation issues with a limit of 50 miles which [place] is too far for her transportation. - Magnesium  - Basic Metabolic Panel (BMET) - B12 and Folate Panel - Phosphorus - Parathyroid  hormone, intact (no Ca) - Urine Microalbumin w/creat. ratio  2. Secondary hyperparathyroidism of renal origin Repeat labs ordered  - Basic Metabolic Panel (BMET) - Vitamin D  (25 hydroxy) - Phosphorus - Parathyroid  hormone, intact (no Ca)  3. Essential hypertension Continue amlodipine  and hydralazine  as prescribed. Restart carvedilol  as prescribed. Follow up in 5 weeks to recheck her blood pressure.  - carvedilol  (COREG ) 12.5 MG tablet; Take 1 tablet (12.5 mg total) by mouth 2 (two) times daily.  Dispense: 180 tablet; Refill: 3 - amLODipine  (NORVASC ) 10 MG tablet; Take 1 tablet (10 mg total) by mouth daily.  Dispense: 90 tablet; Refill: 3 - aspirin  81 MG chewable tablet; Chew 1 tablet (  81 mg total) by mouth daily.  Dispense: 90 tablet; Refill: 3 - hydrALAZINE  (APRESOLINE ) 25 MG tablet; Take 1 tablet (25 mg total) by mouth 3 (three) times daily.  Dispense: 270 tablet; Refill: 3  4. Aortic  atherosclerosis Continue lower dose of rosuvastatin  as prescribed. Repeat lipid panel ordered.  - rosuvastatin  (CRESTOR ) 20 MG tablet; Take 1 tablet (20 mg total) by mouth daily.  Dispense: 90 tablet; Refill: 3 - Lipid Profile  5. Restless leg syndrome Routine labs ordered. Continue ropinirole  as prescribed  - rOPINIRole  (REQUIP ) 0.5 MG tablet; Take 1 tablet (0.5 mg total) by mouth at bedtime.  Dispense: 90 tablet; Refill: 3 - Magnesium  - Basic Metabolic Panel (BMET) - B12 and Folate Panel  6. Prediabetes Repeat lab ordered  - Hgb A1C w/o eAG  7. Lumbar spondylosis Continue methocarbamol  as needed as prescribed   8. Muscle cramps Continue magnesium  supplement as prescribed, repeat magnesium  level ordered  - magnesium  oxide (MAG-OX) 400 MG tablet; Take 1 tablet (400 mg total) by mouth daily.  Dispense: 90 tablet; Refill: 3  9. Gastroesophageal reflux disease without esophagitis Continue famotidine  as prescribed  - famotidine  (PEPCID ) 20 MG tablet; Take one tab po bid before meals  Dispense: 180 tablet; Refill: 3  10. Vitamin D  deficiency Continue weekly vitamin D  as prescribed. Repeat lab ordered  - Vitamin D , Ergocalciferol , (DRISDOL ) 1.25 MG (50000 UNIT) CAPS capsule; Take 1 capsule (50,000 Units total) by mouth every 7 (seven) days.  Dispense: 12 capsule; Refill: 4 - Vitamin D  (25 hydroxy)   General Counseling: Azia verbalizes understanding of the findings of todays visit and agrees with plan of treatment. I have discussed any further diagnostic evaluation that may be needed or ordered today. We also reviewed her medications today. she has been encouraged to call the office with any questions or concerns that should arise related to todays visit.    Orders Placed This Encounter  Procedures   Lipid Profile   Magnesium    Basic Metabolic Panel (BMET)   Vitamin D  (25 hydroxy)   B12 and Folate Panel   Hgb A1C w/o eAG   Phosphorus   Parathyroid  hormone, intact (no Ca)    Urine Microalbumin w/creat. ratio    Meds ordered this encounter  Medications   carvedilol  (COREG ) 12.5 MG tablet    Sig: Take 1 tablet (12.5 mg total) by mouth 2 (two) times daily.    Dispense:  180 tablet    Refill:  3    Fill new script today   amLODipine  (NORVASC ) 10 MG tablet    Sig: Take 1 tablet (10 mg total) by mouth daily.    Dispense:  90 tablet    Refill:  3    Fill new script today   aspirin  81 MG chewable tablet    Sig: Chew 1 tablet (81 mg total) by mouth daily.    Dispense:  90 tablet    Refill:  3   famotidine  (PEPCID ) 20 MG tablet    Sig: Take one tab po bid before meals    Dispense:  180 tablet    Refill:  3   hydrALAZINE  (APRESOLINE ) 25 MG tablet    Sig: Take 1 tablet (25 mg total) by mouth 3 (three) times daily.    Dispense:  270 tablet    Refill:  3    Fill new script now   magnesium  oxide (MAG-OX) 400 MG tablet    Sig: Take 1 tablet (400 mg total) by mouth daily.  Dispense:  90 tablet    Refill:  3   rOPINIRole  (REQUIP ) 0.5 MG tablet    Sig: Take 1 tablet (0.5 mg total) by mouth at bedtime.    Dispense:  90 tablet    Refill:  3    For future refills   Vitamin D , Ergocalciferol , (DRISDOL ) 1.25 MG (50000 UNIT) CAPS capsule    Sig: Take 1 capsule (50,000 Units total) by mouth every 7 (seven) days.    Dispense:  12 capsule    Refill:  4   rosuvastatin  (CRESTOR ) 20 MG tablet    Sig: Take 1 tablet (20 mg total) by mouth daily.    Dispense:  90 tablet    Refill:  3    Fill new script today    Return in about 1 month (around 08/08/2024) for F/U, Taylorann Tkach PCP.   Total time spent:30 Minutes Time spent includes review of chart, medications, test results, and follow up plan with the patient.   [place] Controlled Substance Database was reviewed by me.  This patient was seen by Mardy Maxin, FNP-C in collaboration with Dr. Sigrid Bathe as a part of collaborative care agreement.   Shalicia Craghead R. Maxin, MSN, FNP-C Internal medicine

## 2024-07-08 ENCOUNTER — Encounter: Payer: Self-pay | Admitting: Nurse Practitioner

## 2024-07-08 DIAGNOSIS — R252 Cramp and spasm: Secondary | ICD-10-CM | POA: Insufficient documentation

## 2024-07-08 DIAGNOSIS — K219 Gastro-esophageal reflux disease without esophagitis: Secondary | ICD-10-CM | POA: Insufficient documentation

## 2024-07-13 ENCOUNTER — Ambulatory Visit: Admitting: Podiatry

## 2024-07-13 ENCOUNTER — Other Ambulatory Visit

## 2024-07-15 ENCOUNTER — Ambulatory Visit: Admitting: Podiatry

## 2024-07-18 ENCOUNTER — Other Ambulatory Visit

## 2024-07-18 DIAGNOSIS — K5792 Diverticulitis of intestine, part unspecified, without perforation or abscess without bleeding: Secondary | ICD-10-CM

## 2024-07-25 ENCOUNTER — Ambulatory Visit (INDEPENDENT_AMBULATORY_CARE_PROVIDER_SITE_OTHER): Admitting: Surgery

## 2024-07-25 ENCOUNTER — Encounter: Payer: Self-pay | Admitting: Surgery

## 2024-07-25 VITALS — BP 174/108 | HR 89 | Temp 98.0°F | Ht 65.0 in | Wt 234.2 lb

## 2024-07-25 DIAGNOSIS — I129 Hypertensive chronic kidney disease with stage 1 through stage 4 chronic kidney disease, or unspecified chronic kidney disease: Secondary | ICD-10-CM | POA: Diagnosis not present

## 2024-07-25 DIAGNOSIS — K5792 Diverticulitis of intestine, part unspecified, without perforation or abscess without bleeding: Secondary | ICD-10-CM

## 2024-07-25 DIAGNOSIS — N189 Chronic kidney disease, unspecified: Secondary | ICD-10-CM

## 2024-07-25 NOTE — Progress Notes (Unsigned)
 Outpatient Surgical Follow Up  07/25/2024  Whitney [name] is an 73 y.o. female.   Chief Complaint  Patient presents with   Follow-up    Diverticulitis     HPI: ***  Past Medical History:  Diagnosis Date   Abnormal Pap smear of cervix    Pt states she had colposcopy    Anxiety    Aortic atherosclerosis    Arthritis    CAD (coronary artery disease)    Chronic kidney disease    s/p LEFT nephrectomy   Chronic lower back pain    COVID    Depression    Diverticulitis    Dyspnea    History of methicillin resistant staphylococcus aureus (MRSA) 12/21/2020   nasal swab pcr   Hx of unilateral nephrectomy 1979   Left Nephrectomy   Hypertension    Lower extremity edema    PAF (paroxysmal atrial fibrillation) (HCC)    Palpitations    PMB (postmenopausal bleeding)    PVC's (premature ventricular contractions) 10/18/2018   Restless leg syndrome    Sleep apnea    has no cpap   Symptomatic bradycardia    Vitamin D  deficiency     Past Surgical History:  Procedure Laterality Date   BIOPSY  10/31/2019   Procedure: BIOPSY;  Surgeon: Wilhelmenia Aloha Raddle., MD;  Location: [facility] ENDOSCOPY;  Service: Gastroenterology;;   CARDIAC CATHETERIZATION     CHOLECYSTECTOMY     COLONOSCOPY WITH PROPOFOL  N/A 06/13/2019   Procedure: COLONOSCOPY WITH PROPOFOL ;  Surgeon: Therisa Bi, MD;  Location: [facility] ENDOSCOPY;  Service: Gastroenterology;  Laterality: N/A;   COLONOSCOPY WITH PROPOFOL  N/A 08/12/2019   Procedure: COLONOSCOPY WITH PROPOFOL ;  Surgeon: Therisa Bi, MD;  Location: [facility] ENDOSCOPY;  Service: Gastroenterology;  Laterality: N/A;   COLONOSCOPY WITH PROPOFOL  N/A 09/19/2019   Procedure: COLONOSCOPY WITH PROPOFOL ;  Surgeon: Therisa Bi, MD;  Location: [facility] ENDOSCOPY;  Service: Gastroenterology;  Laterality: N/A;   COLONOSCOPY WITH PROPOFOL  N/A 06/04/2021   Procedure: COLONOSCOPY WITH PROPOFOL ;  Surgeon: Therisa Bi, MD;  Location: [facility] ENDOSCOPY;  Service:  Gastroenterology;  Laterality: N/A;   DILATION AND CURETTAGE OF UTERUS     ESOPHAGOGASTRODUODENOSCOPY (EGD) WITH PROPOFOL  N/A 06/13/2019   Procedure: ESOPHAGOGASTRODUODENOSCOPY (EGD) WITH PROPOFOL ;  Surgeon: Therisa Bi, MD;  Location: [facility] ENDOSCOPY;  Service: Gastroenterology;  Laterality: N/A;  Pt will go for COVID test on 06-08-19 as she uses public transportation and will be in the area on that day.    ESOPHAGOGASTRODUODENOSCOPY (EGD) WITH PROPOFOL  N/A 10/31/2019   Procedure: ESOPHAGOGASTRODUODENOSCOPY (EGD) WITH PROPOFOL ;  Surgeon: Wilhelmenia Aloha Raddle., MD;  Location: [facility] ENDOSCOPY;  Service: Gastroenterology;  Laterality: N/A;   ESOPHAGOGASTRODUODENOSCOPY (EGD) WITH PROPOFOL  N/A 06/04/2021   Procedure: ESOPHAGOGASTRODUODENOSCOPY (EGD) WITH PROPOFOL ;  Surgeon: Therisa Bi, MD;  Location: [facility] ENDOSCOPY;  Service: Gastroenterology;  Laterality: N/A;   HEMOSTASIS CLIP PLACEMENT  10/31/2019   Procedure: HEMOSTASIS CLIP PLACEMENT;  Surgeon: Wilhelmenia Aloha Raddle., MD;  Location: [facility] ENDOSCOPY;  Service: Gastroenterology;;   HERNIA REPAIR     HYSTEROSCOPY WITH D & C N/A 02/16/2017   Procedure: DILATATION AND CURETTAGE /HYSTEROSCOPY;  Surgeon: Kathe Gladis LABOR, MD;  Location: [facility] ORS;  Service: Gynecology;  Laterality: N/A;   JOINT REPLACEMENT Left    knee   kidney removal Left    KIDNEY SURGERY Left 1979   left kidney removed   LEFT HEART CATH AND CORONARY ANGIOGRAPHY N/A 10/21/2017   Procedure: LEFT HEART CATH AND CORONARY ANGIOGRAPHY;  Surgeon: Bosie Vinie LABOR, MD;  Location: [facility] INVASIVE  CV LAB;  Service: Cardiovascular;  Laterality: N/A;   POLYPECTOMY  10/31/2019   Procedure: POLYPECTOMY;  Surgeon: Wilhelmenia Aloha Raddle., MD;  Location: [facility] ENDOSCOPY;  Service: Gastroenterology;;   TOTAL KNEE ARTHROPLASTY Left 07/06/2017   Procedure: TOTAL KNEE ARTHROPLASTY;  Surgeon: Leora Lynwood SAUNDERS, MD;  Location: [facility] ORS;  Service: Orthopedics;  Laterality: Left;   TOTAL KNEE  ARTHROPLASTY Right 02/25/2021   Procedure: TOTAL KNEE ARTHROPLASTY;  Surgeon: Leora Lynwood SAUNDERS, MD;  Location: [facility] ORS;  Service: Orthopedics;  Laterality: Right;   UPPER ESOPHAGEAL ENDOSCOPIC ULTRASOUND (EUS) N/A 10/31/2019   Procedure: UPPER ESOPHAGEAL ENDOSCOPIC ULTRASOUND (EUS);  Surgeon: Wilhelmenia Aloha Raddle., MD;  Location: [facility] ENDOSCOPY;  Service: Gastroenterology;  Laterality: N/A;   WRIST ARTHROSCOPY Left 1970s   XI ROBOTIC ASSISTED VENTRAL HERNIA N/A 12/01/2019   Procedure: XI ROBOTIC ASSISTED VENTRAL HERNIA;  Surgeon: Jordis Laneta FALCON, MD;  Location: [facility] ORS;  Service: General;  Laterality: N/A;    Family History  Problem Relation Age of Onset   Ovarian cancer Mother    Hypertension Father    Diabetes Father    Cancer Father    Breast cancer Sister    Diabetes Sister     Social History:  reports that she has never smoked. She has never used smokeless tobacco. She reports current alcohol use of about 1.0 standard drink of alcohol per week. She reports current drug use. Frequency: 3.00 times per week. Drug: Marijuana.  Allergies: Allergies[1]  Medications reviewed.    ROS Full ROS performed and is otherwise negative other than what is stated in HPI   BP (!) 174/108   Pulse 89   Temp 98 F (36.7 C) (Oral)   Ht 5' 5 (1.651 m)   Wt 234 lb 3.2 oz (106.2 kg)   LMP 01/26/2017 Comment: age 39  SpO2 99%   BMI 38.97 kg/m   Physical Exam     No results found for this or any previous visit (from the past 48 hours). No results found.  Assessment/Plan: I personally spent a total of *** minutes in the care of the patient today including performing a medically appropriate exam/evaluation, counseling and educating, placing orders, referring and communicating with other health care professionals, documenting clinical information in the EHR, independently interpreting and reviewing images studies and coordinating care.   Laneta Jordis, MD FACS General Surgeon       [1] Allergies Allergen Reactions   Enalapril Swelling and Other (See Comments)   Lisinopril Swelling   Ace Inhibitors Swelling    And ARB

## 2024-07-26 ENCOUNTER — Encounter: Payer: Self-pay | Admitting: Surgery

## 2024-07-31 ENCOUNTER — Encounter: Payer: Self-pay | Admitting: Nurse Practitioner

## 2024-07-31 DIAGNOSIS — I129 Hypertensive chronic kidney disease with stage 1 through stage 4 chronic kidney disease, or unspecified chronic kidney disease: Secondary | ICD-10-CM | POA: Insufficient documentation

## 2024-07-31 DIAGNOSIS — M722 Plantar fascial fibromatosis: Secondary | ICD-10-CM | POA: Insufficient documentation

## 2024-08-10 ENCOUNTER — Ambulatory Visit (INDEPENDENT_AMBULATORY_CARE_PROVIDER_SITE_OTHER): Admitting: Nurse Practitioner

## 2024-08-10 ENCOUNTER — Encounter: Payer: Self-pay | Admitting: Nurse Practitioner

## 2024-08-10 VITALS — BP 138/70 | HR 90 | Temp 97.2°F | Resp 16 | Ht 65.0 in | Wt 232.0 lb

## 2024-08-10 DIAGNOSIS — N184 Chronic kidney disease, stage 4 (severe): Secondary | ICD-10-CM | POA: Insufficient documentation

## 2024-08-10 DIAGNOSIS — I480 Paroxysmal atrial fibrillation: Secondary | ICD-10-CM

## 2024-08-10 DIAGNOSIS — I10 Essential (primary) hypertension: Secondary | ICD-10-CM

## 2024-08-10 MED ORDER — AMLODIPINE BESYLATE 10 MG PO TABS: 10.0000 mg | ORAL_TABLET | Freq: Every day | ORAL | 0 refills | Status: AC

## 2024-08-10 NOTE — Progress Notes (Signed)
 [facility] [address] [place], [place] [address]  Internal MEDICINE  Office Visit Note  Patient Name: Whitney [name]  [id number]  [id number]  Date of Service: 08/10/2024  Chief Complaint  Patient presents with   Depression   Hypertension   Follow-up    HPI Whitney [name] presents for a follow-up visit for  Hypertension -- ran out of amlodipine  but BP has improved with her restarting all of the medications.  Reminded patient to have labs done  Bubbly urine -- need to check for albuminuria Elevated potassium, has been eating bananas daily.     Current Medication: Outpatient Encounter Medications as of 08/10/2024  Medication Sig   albuterol  (VENTOLIN  HFA) 108 (90 Base) MCG/ACT inhaler Inhale 2 puffs into the lungs every 4 (four) hours as needed for wheezing or shortness of breath.   amLODipine  (NORVASC ) 10 MG tablet Take 1 tablet (10 mg total) by mouth daily.   aspirin  81 MG chewable tablet Chew 1 tablet (81 mg total) by mouth daily.   carvedilol  (COREG ) 12.5 MG tablet Take 1 tablet (12.5 mg total) by mouth 2 (two) times daily.   diclofenac Sodium (VOLTAREN) 1 % GEL Apply 2 g topically daily as needed (Knee pain).   docusate sodium  (COLACE) 100 MG capsule Take 1 capsule (100 mg total) by mouth 2 (two) times daily.   famotidine  (PEPCID ) 20 MG tablet Take one tab po bid before meals   gabapentin  (NEURONTIN ) 100 MG capsule Take 200 mg by mouth 2 (two) times daily.   hydrALAZINE  (APRESOLINE ) 25 MG tablet Take 1 tablet (25 mg total) by mouth 3 (three) times daily.   magnesium  oxide (MAG-OX) 400 MG tablet Take 1 tablet (400 mg total) by mouth daily.   methocarbamol  (ROBAXIN ) 500 MG tablet TAKE 1 TABLET(500 MG) BY MOUTH EVERY 8 HOURS AS NEEDED FOR MUSCLE SPASMS   rOPINIRole  (REQUIP ) 0.5 MG tablet Take 1 tablet (0.5 mg total) by mouth at bedtime.   rosuvastatin  (CRESTOR ) 20 MG tablet Take 1 tablet (20 mg total) by mouth daily.   Vitamin D , Ergocalciferol , (DRISDOL ) 1.25 MG (50000  UNIT) CAPS capsule Take 1 capsule (50,000 Units total) by mouth every 7 (seven) days.   [DISCONTINUED] amLODipine  (NORVASC ) 10 MG tablet Take 1 tablet (10 mg total) by mouth daily.   No facility-administered encounter medications on file as of 08/10/2024.    Surgical History: Past Surgical History:  Procedure Laterality Date   BIOPSY  10/31/2019   Procedure: BIOPSY;  Surgeon: Wilhelmenia Aloha Raddle., MD;  Location: [facility] ENDOSCOPY;  Service: Gastroenterology;;   CARDIAC CATHETERIZATION     CHOLECYSTECTOMY     COLONOSCOPY WITH PROPOFOL  N/A 06/13/2019   Procedure: COLONOSCOPY WITH PROPOFOL ;  Surgeon: Therisa Bi, MD;  Location: [facility] ENDOSCOPY;  Service: Gastroenterology;  Laterality: N/A;   COLONOSCOPY WITH PROPOFOL  N/A 08/12/2019   Procedure: COLONOSCOPY WITH PROPOFOL ;  Surgeon: Therisa Bi, MD;  Location: [facility] ENDOSCOPY;  Service: Gastroenterology;  Laterality: N/A;   COLONOSCOPY WITH PROPOFOL  N/A 09/19/2019   Procedure: COLONOSCOPY WITH PROPOFOL ;  Surgeon: Therisa Bi, MD;  Location: [facility] ENDOSCOPY;  Service: Gastroenterology;  Laterality: N/A;   COLONOSCOPY WITH PROPOFOL  N/A 06/04/2021   Procedure: COLONOSCOPY WITH PROPOFOL ;  Surgeon: Therisa Bi, MD;  Location: [facility] ENDOSCOPY;  Service: Gastroenterology;  Laterality: N/A;   DILATION AND CURETTAGE OF UTERUS     ESOPHAGOGASTRODUODENOSCOPY (EGD) WITH PROPOFOL  N/A 06/13/2019   Procedure: ESOPHAGOGASTRODUODENOSCOPY (EGD) WITH PROPOFOL ;  Surgeon: Therisa Bi, MD;  Location: [facility] ENDOSCOPY;  Service: Gastroenterology;  Laterality: N/A;  Pt will go for COVID test on 06-08-19 as she uses public transportation and will be in the area on that day.    ESOPHAGOGASTRODUODENOSCOPY (EGD) WITH PROPOFOL  N/A 10/31/2019   Procedure: ESOPHAGOGASTRODUODENOSCOPY (EGD) WITH PROPOFOL ;  Surgeon: Wilhelmenia Aloha Raddle., MD;  Location: [facility] ENDOSCOPY;  Service: Gastroenterology;  Laterality: N/A;   ESOPHAGOGASTRODUODENOSCOPY (EGD) WITH PROPOFOL  N/A 06/04/2021   Procedure:  ESOPHAGOGASTRODUODENOSCOPY (EGD) WITH PROPOFOL ;  Surgeon: Therisa Bi, MD;  Location: [facility] ENDOSCOPY;  Service: Gastroenterology;  Laterality: N/A;   HEMOSTASIS CLIP PLACEMENT  10/31/2019   Procedure: HEMOSTASIS CLIP PLACEMENT;  Surgeon: Wilhelmenia Aloha Raddle., MD;  Location: [facility] ENDOSCOPY;  Service: Gastroenterology;;   HERNIA REPAIR     HYSTEROSCOPY WITH D & C N/A 02/16/2017   Procedure: DILATATION AND CURETTAGE /HYSTEROSCOPY;  Surgeon: Kathe Gladis LABOR, MD;  Location: [facility] ORS;  Service: Gynecology;  Laterality: N/A;   JOINT REPLACEMENT Left    knee   kidney removal Left    KIDNEY SURGERY Left 1979   left kidney removed   LEFT HEART CATH AND CORONARY ANGIOGRAPHY N/A 10/21/2017   Procedure: LEFT HEART CATH AND CORONARY ANGIOGRAPHY;  Surgeon: Bosie Vinie LABOR, MD;  Location: [facility] INVASIVE CV LAB;  Service: Cardiovascular;  Laterality: N/A;   POLYPECTOMY  10/31/2019   Procedure: POLYPECTOMY;  Surgeon: Wilhelmenia Aloha Raddle., MD;  Location: [facility] ENDOSCOPY;  Service: Gastroenterology;;   TOTAL KNEE ARTHROPLASTY Left 07/06/2017   Procedure: TOTAL KNEE ARTHROPLASTY;  Surgeon: Leora Lynwood SAUNDERS, MD;  Location: [facility] ORS;  Service: Orthopedics;  Laterality: Left;   TOTAL KNEE ARTHROPLASTY Right 02/25/2021   Procedure: TOTAL KNEE ARTHROPLASTY;  Surgeon: Leora Lynwood SAUNDERS, MD;  Location: [facility] ORS;  Service: Orthopedics;  Laterality: Right;   UPPER ESOPHAGEAL ENDOSCOPIC ULTRASOUND (EUS) N/A 10/31/2019   Procedure: UPPER ESOPHAGEAL ENDOSCOPIC ULTRASOUND (EUS);  Surgeon: Wilhelmenia Aloha Raddle., MD;  Location: [facility] ENDOSCOPY;  Service: Gastroenterology;  Laterality: N/A;   WRIST ARTHROSCOPY Left 1970s   XI ROBOTIC ASSISTED VENTRAL HERNIA N/A 12/01/2019   Procedure: XI ROBOTIC ASSISTED VENTRAL HERNIA;  Surgeon: Jordis Laneta FALCON, MD;  Location: [facility] ORS;  Service: General;  Laterality: N/A;    Medical History: Past Medical History:  Diagnosis Date   Abnormal Pap smear of cervix    Pt states she had colposcopy     Anxiety    Aortic atherosclerosis    Arthritis    CAD (coronary artery disease)    Chronic kidney disease    s/p LEFT nephrectomy   Chronic lower back pain    COVID    Depression    Diverticulitis    Dyspnea    History of methicillin resistant staphylococcus aureus (MRSA) 12/21/2020   nasal swab pcr   Hx of unilateral nephrectomy 1979   Left Nephrectomy   Hypertension    Lower extremity edema    PAF (paroxysmal atrial fibrillation) (HCC)    Palpitations    PMB (postmenopausal bleeding)    PVC's (premature ventricular contractions) 10/18/2018   Restless leg syndrome    Sleep apnea    has no cpap   Symptomatic bradycardia    Vitamin D  deficiency     Family History: Family History  Problem Relation Age of Onset   Ovarian cancer Mother    Hypertension Father    Diabetes Father    Cancer Father    Breast cancer Sister    Diabetes Sister     Social History   Socioeconomic History   Marital status: Divorced    Spouse name: Not on  file   Number of children: Not on file   Years of education: Not on file   Highest education level: Not on file  Occupational History   Not on file  Tobacco Use   Smoking status: Never   Smokeless tobacco: Never  Vaping Use   Vaping status: Never Used  Substance and Sexual Activity   Alcohol use: Yes    Alcohol/week: 1.0 standard drink of alcohol    Types: 1 Standard drinks or equivalent per week    Comment: occasionally   Drug use: Yes    Frequency: 3.0 times per week    Types: Marijuana    Comment: 07/06/23: Pt states she uses for leg pain: marijuana; states no longer uses cocaine   Sexual activity: Yes    Birth control/protection: Post-menopausal  Other Topics Concern   Not on file  Social History Narrative   Lives with cousin.   Social Drivers of Health   Tobacco Use: Low Risk (08/10/2024)   Patient History    Smoking Tobacco Use: Never    Smokeless Tobacco Use: Never    Passive Exposure: Not on file  Financial Resource  Strain: Not on file  Food Insecurity: Low Risk (02/03/2024)   Received from [facility]   Epic    Within the past 12 months, you worried that your food would run out before you got money to buy more: Never true    Within the past 12 months, the food you bought just didn't last and you didn't have money to get more. : Never true  Transportation Needs: No Transportation Needs (02/03/2024)   Received from [facility]    In the past 12 months, has lack of reliable transportation kept you from medical appointments, meetings, work or from getting things needed for daily living? : No  Physical Activity: Not on file  Stress: Not on file  Social Connections: Not on file  Intimate Partner Violence: Not on file  Depression (PHQ2-9): Low Risk (07/06/2023)   Depression (PHQ2-9)    PHQ-2 Score: 0  Alcohol Screen: Low Risk (04/29/2022)   Alcohol Screen    Last Alcohol Screening Score (AUDIT): 2  Housing: Low Risk (02/03/2024)   Received from [facility]   Epic    What is your living situation today?: I have a steady place to live    Think about the place you live. Do you have problems with any of the following? Choose all that apply:: None/None on this list  Utilities: Low Risk (02/03/2024)   Received from [facility]   Utilities    In the past 12 months has the electric, gas, oil, or water company threatened to shut off services in your home? : No  Health Literacy: Not on file      Review of Systems  Constitutional:  Positive for fatigue. Negative for chills and unexpected weight change.  HENT: Negative.  Negative for congestion, rhinorrhea, sneezing and sore throat.   Eyes:  Negative for redness.  Respiratory: Negative.  Negative for cough, chest tightness, shortness of breath and wheezing.   Cardiovascular: Negative.  Negative for chest pain and palpitations.  Gastrointestinal: Negative.  Negative for abdominal pain, constipation, diarrhea, nausea and vomiting.   Genitourinary: Negative.  Negative for dysuria and frequency.  Musculoskeletal: Negative.  Negative for arthralgias, back pain, joint swelling and neck pain.  Skin:  Negative for rash.  Neurological:  Negative for dizziness, tremors, numbness and headaches.  Hematological:  Negative for adenopathy. Does  not bruise/bleed easily.  Psychiatric/Behavioral:  Negative for behavioral problems (Depression), self-injury, sleep disturbance and suicidal ideas. The patient is nervous/anxious.     Vital Signs: BP 138/70 Comment: 166/136  Pulse 90   Temp (!) 97.2 F (36.2 C)   Resp 16   Ht 5' 5 (1.651 m)   Wt 232 lb (105.2 kg)   LMP 01/26/2017 Comment: age 69  SpO2 99%   BMI 38.61 kg/m    Physical Exam Vitals reviewed.  Constitutional:      General: She is not in acute distress.    Appearance: Normal appearance. She is obese. She is not ill-appearing.  HENT:     Head: Normocephalic and atraumatic.  Eyes:     Pupils: Pupils are equal, round, and reactive to light.  Cardiovascular:     Rate and Rhythm: Normal rate and regular rhythm.  Pulmonary:     Effort: Pulmonary effort is normal. No respiratory distress.  Neurological:     Mental Status: She is alert and oriented to person, place, and time.     Cranial Nerves: No cranial nerve deficit.     Coordination: Coordination normal.     Gait: Gait normal.  Psychiatric:        Mood and Affect: Mood normal.        Behavior: Behavior normal.        Assessment/Plan: 1. Paroxysmal atrial fibrillation (HCC) (Primary) Continue medications as prescribed.   2. Stage 4 chronic kidney disease (HCC) Reminded patient to have labs drawn and provide urine specimen at [facility] asap. Has appt with nephrology tomorrow - Urine Microalbumin w/creat. ratio  3. Essential hypertension Please continue all medications including amlodipine  and pick up from local pharmacy.  - amLODipine  (NORVASC ) 10 MG tablet; Take 1 tablet (10 mg total) by mouth  daily.  Dispense: 90 tablet; Refill: 0  4. Morbid obesity (HCC) Increasing possible complications with comorbid conditions. Encourage with loss, adjust diet and portion sizes, increase physical activity as tolerated.    General Counseling: rahel carlton understanding of the findings of todays visit and agrees with plan of treatment. I have discussed any further diagnostic evaluation that may be needed or ordered today. We also reviewed her medications today. she has been encouraged to call the office with any questions or concerns that should arise related to todays visit.    Orders Placed This Encounter  Procedures   Urine Microalbumin w/creat. ratio    Meds ordered this encounter  Medications   amLODipine  (NORVASC ) 10 MG tablet    Sig: Take 1 tablet (10 mg total) by mouth daily.    Dispense:  90 tablet    Refill:  0    Fill new script today, patient needs to pick up before going out of town later these evening.    Return in about 1 month (around 09/10/2024) for F/U, Labs, Malaky Tetrault PCP.   Total time spent:30 Minutes Time spent includes review of chart, medications, test results, and follow up plan with the patient.   [place] Controlled Substance Database was reviewed by me.  This patient was seen by Mardy Maxin, FNP-C in collaboration with Dr. Sigrid Bathe as a part of collaborative care agreement.   Jamicah Anstead R. Maxin, MSN, FNP-C Internal medicine

## 2024-08-11 ENCOUNTER — Encounter: Payer: Self-pay | Admitting: Nurse Practitioner

## 2024-08-12 LAB — MAGNESIUM: Magnesium: 2.5 mg/dL — ABNORMAL HIGH (ref 1.6–2.3)

## 2024-08-12 LAB — PARATHYROID HORMONE, INTACT (NO CA): PTH: 103 pg/mL — ABNORMAL HIGH (ref 15–65)

## 2024-08-12 LAB — LIPID PANEL
Chol/HDL Ratio: 5.2 ratio — ABNORMAL HIGH (ref 0.0–4.4)
Cholesterol, Total: 429 mg/dL — ABNORMAL HIGH (ref 100–199)
HDL: 82 mg/dL
LDL Chol Calc (NIH): 324 mg/dL — ABNORMAL HIGH (ref 0–99)
Triglycerides: 127 mg/dL (ref 0–149)
VLDL Cholesterol Cal: 23 mg/dL (ref 5–40)

## 2024-08-12 LAB — BASIC METABOLIC PANEL WITH GFR
BUN/Creatinine Ratio: 16 (ref 12–28)
BUN: 32 mg/dL — ABNORMAL HIGH (ref 8–27)
CO2: 22 mmol/L (ref 20–29)
Calcium: 9.1 mg/dL (ref 8.7–10.3)
Chloride: 106 mmol/L (ref 96–106)
Creatinine, Ser: 1.98 mg/dL — ABNORMAL HIGH (ref 0.57–1.00)
Glucose: 91 mg/dL (ref 70–99)
Potassium: 5.5 mmol/L — ABNORMAL HIGH (ref 3.5–5.2)
Sodium: 142 mmol/L (ref 134–144)
eGFR: 26 mL/min/1.73 — ABNORMAL LOW

## 2024-08-12 LAB — MICROALBUMIN / CREATININE URINE RATIO
Creatinine, Urine: 117 mg/dL
Microalb/Creat Ratio: 4151 mg/g{creat} — ABNORMAL HIGH (ref 0–29)
Microalbumin, Urine: 4856.3 ug/mL

## 2024-08-12 LAB — B12 AND FOLATE PANEL
Folate: 11 ng/mL
Vitamin B-12: 737 pg/mL (ref 232–1245)

## 2024-08-12 LAB — HGB A1C W/O EAG: Hgb A1c MFr Bld: 5.5 % (ref 4.8–5.6)

## 2024-08-12 LAB — PHOSPHORUS: Phosphorus: 4.6 mg/dL — ABNORMAL HIGH (ref 3.0–4.3)

## 2024-08-12 LAB — VITAMIN D 25 HYDROXY (VIT D DEFICIENCY, FRACTURES): Vit D, 25-Hydroxy: 20.9 ng/mL — ABNORMAL LOW (ref 30.0–100.0)

## 2024-08-22 ENCOUNTER — Ambulatory Visit: Payer: Self-pay | Admitting: Nurse Practitioner

## 2024-08-22 NOTE — Progress Notes (Signed)
 Patient has been seen recently by her nephrologist. I have reviewed the results, we will discuss in detail at her upcoming office visit.

## 2024-09-13 ENCOUNTER — Ambulatory Visit: Admitting: Nurse Practitioner

## 2024-09-21 ENCOUNTER — Ambulatory Visit: Admitting: Nurse Practitioner

## 2024-10-24 ENCOUNTER — Ambulatory Visit: Admitting: Surgery

## 2025-06-26 ENCOUNTER — Ambulatory Visit: Admitting: Nurse Practitioner
